# Patient Record
Sex: Male | Born: 1955
Health system: Southern US, Community
[De-identification: ages and names within clinical notes are randomized; demographics above are authoritative.]

## PROBLEM LIST (undated history)

## (undated) DIAGNOSIS — J449 Chronic obstructive pulmonary disease, unspecified: Secondary | ICD-10-CM

## (undated) DIAGNOSIS — Z720 Tobacco use: Secondary | ICD-10-CM

## (undated) DIAGNOSIS — I2510 Atherosclerotic heart disease of native coronary artery without angina pectoris: Secondary | ICD-10-CM

## (undated) DIAGNOSIS — I428 Other cardiomyopathies: Secondary | ICD-10-CM

## (undated) DIAGNOSIS — Z5189 Encounter for other specified aftercare: Secondary | ICD-10-CM

## (undated) DIAGNOSIS — I10 Essential (primary) hypertension: Secondary | ICD-10-CM

## (undated) DIAGNOSIS — Z860100 Personal history of colon polyps, unspecified: Secondary | ICD-10-CM

## (undated) DIAGNOSIS — I739 Peripheral vascular disease, unspecified: Secondary | ICD-10-CM

## (undated) DIAGNOSIS — K219 Gastro-esophageal reflux disease without esophagitis: Secondary | ICD-10-CM

## (undated) DIAGNOSIS — K5730 Diverticulosis of large intestine without perforation or abscess without bleeding: Secondary | ICD-10-CM

## (undated) DIAGNOSIS — M1990 Unspecified osteoarthritis, unspecified site: Secondary | ICD-10-CM

## (undated) DIAGNOSIS — I509 Heart failure, unspecified: Secondary | ICD-10-CM

## (undated) DIAGNOSIS — J439 Emphysema, unspecified: Secondary | ICD-10-CM

## (undated) DIAGNOSIS — Z950 Presence of cardiac pacemaker: Secondary | ICD-10-CM

## (undated) DIAGNOSIS — G4730 Sleep apnea, unspecified: Secondary | ICD-10-CM

## (undated) DIAGNOSIS — R0602 Shortness of breath: Secondary | ICD-10-CM

## (undated) DIAGNOSIS — Z95810 Presence of automatic (implantable) cardiac defibrillator: Secondary | ICD-10-CM

## (undated) HISTORY — DX: Diverticulosis of large intestine without perforation or abscess without bleeding: K57.30

## (undated) HISTORY — PX: OTHER SURGICAL HISTORY: SHX169

## (undated) HISTORY — DX: Personal history of colonic polyps: Z86.010

## (undated) HISTORY — DX: Essential (primary) hypertension: I10

## (undated) HISTORY — DX: Sleep apnea, unspecified: G47.30

## (undated) HISTORY — DX: Chronic obstructive pulmonary disease, unspecified: J44.9

## (undated) HISTORY — DX: Heart failure, unspecified: I50.9

## (undated) HISTORY — DX: Other cardiomyopathies: I42.8

## (undated) HISTORY — DX: Gastro-esophageal reflux disease without esophagitis: K21.9

## (undated) HISTORY — DX: Encounter for other specified aftercare: Z51.89

## (undated) HISTORY — DX: Emphysema, unspecified: J43.9

## (undated) HISTORY — DX: Personal history of colon polyps, unspecified: Z86.0100

## (undated) HISTORY — DX: Atherosclerotic heart disease of native coronary artery without angina pectoris: I25.10

## (undated) HISTORY — DX: Peripheral vascular disease, unspecified: I73.9

## (undated) HISTORY — DX: Tobacco use: Z72.0

---

## 2002-07-26 ENCOUNTER — Encounter: Payer: Self-pay | Admitting: Family Medicine

## 2002-07-26 ENCOUNTER — Encounter: Admission: RE | Admit: 2002-07-26 | Discharge: 2002-07-26 | Payer: Self-pay | Admitting: Family Medicine

## 2004-06-22 HISTORY — PX: CARDIAC CATHETERIZATION: SHX172

## 2004-07-13 ENCOUNTER — Inpatient Hospital Stay (HOSPITAL_COMMUNITY): Admission: EM | Admit: 2004-07-13 | Discharge: 2004-07-14 | Payer: Self-pay | Admitting: Emergency Medicine

## 2004-08-02 ENCOUNTER — Encounter: Payer: Self-pay | Admitting: Pulmonary Disease

## 2004-08-02 ENCOUNTER — Ambulatory Visit (HOSPITAL_BASED_OUTPATIENT_CLINIC_OR_DEPARTMENT_OTHER): Admission: RE | Admit: 2004-08-02 | Discharge: 2004-08-02 | Payer: Self-pay | Admitting: Cardiology

## 2004-10-09 ENCOUNTER — Ambulatory Visit: Payer: Self-pay | Admitting: Cardiology

## 2004-10-23 ENCOUNTER — Ambulatory Visit: Payer: Self-pay | Admitting: Cardiology

## 2004-12-06 ENCOUNTER — Ambulatory Visit: Payer: Self-pay | Admitting: *Deleted

## 2005-01-14 ENCOUNTER — Ambulatory Visit: Payer: Self-pay | Admitting: Cardiology

## 2005-03-01 ENCOUNTER — Ambulatory Visit: Payer: Self-pay | Admitting: Cardiology

## 2005-03-11 ENCOUNTER — Ambulatory Visit: Payer: Self-pay | Admitting: Internal Medicine

## 2005-03-11 ENCOUNTER — Ambulatory Visit: Payer: Self-pay | Admitting: Cardiology

## 2005-03-13 ENCOUNTER — Ambulatory Visit: Admission: RE | Admit: 2005-03-13 | Discharge: 2005-03-13 | Payer: Self-pay | Admitting: Internal Medicine

## 2005-03-18 ENCOUNTER — Ambulatory Visit: Payer: Self-pay | Admitting: Internal Medicine

## 2005-03-18 ENCOUNTER — Ambulatory Visit (HOSPITAL_COMMUNITY): Admission: RE | Admit: 2005-03-18 | Discharge: 2005-03-18 | Payer: Self-pay | Admitting: Internal Medicine

## 2005-04-09 ENCOUNTER — Ambulatory Visit: Payer: Self-pay | Admitting: Internal Medicine

## 2005-04-15 ENCOUNTER — Ambulatory Visit: Payer: Self-pay | Admitting: Cardiology

## 2005-04-22 HISTORY — PX: COLONOSCOPY: SHX174

## 2005-04-23 ENCOUNTER — Ambulatory Visit (HOSPITAL_COMMUNITY): Admission: RE | Admit: 2005-04-23 | Discharge: 2005-04-23 | Payer: Self-pay | Admitting: Cardiology

## 2005-04-23 ENCOUNTER — Ambulatory Visit: Payer: Self-pay | Admitting: Cardiology

## 2005-04-29 ENCOUNTER — Ambulatory Visit: Payer: Self-pay | Admitting: Internal Medicine

## 2005-05-02 ENCOUNTER — Ambulatory Visit: Payer: Self-pay | Admitting: Internal Medicine

## 2005-05-02 ENCOUNTER — Encounter (INDEPENDENT_AMBULATORY_CARE_PROVIDER_SITE_OTHER): Payer: Self-pay | Admitting: Specialist

## 2005-05-15 ENCOUNTER — Ambulatory Visit: Payer: Self-pay

## 2005-05-22 ENCOUNTER — Ambulatory Visit: Payer: Self-pay | Admitting: Cardiology

## 2005-05-28 ENCOUNTER — Ambulatory Visit: Payer: Self-pay | Admitting: Cardiology

## 2005-06-04 ENCOUNTER — Ambulatory Visit: Payer: Self-pay | Admitting: Internal Medicine

## 2005-07-08 ENCOUNTER — Ambulatory Visit: Payer: Self-pay | Admitting: Internal Medicine

## 2005-07-18 ENCOUNTER — Ambulatory Visit: Payer: Self-pay | Admitting: Internal Medicine

## 2005-07-25 ENCOUNTER — Ambulatory Visit: Payer: Self-pay | Admitting: Cardiology

## 2005-08-19 ENCOUNTER — Ambulatory Visit: Payer: Self-pay | Admitting: Cardiology

## 2005-09-25 ENCOUNTER — Ambulatory Visit: Payer: Self-pay | Admitting: Cardiology

## 2005-09-25 ENCOUNTER — Inpatient Hospital Stay (HOSPITAL_COMMUNITY): Admission: EM | Admit: 2005-09-25 | Discharge: 2005-09-26 | Payer: Self-pay | Admitting: Emergency Medicine

## 2005-10-01 ENCOUNTER — Ambulatory Visit: Payer: Self-pay | Admitting: Internal Medicine

## 2005-11-04 ENCOUNTER — Ambulatory Visit: Payer: Self-pay | Admitting: Cardiology

## 2005-11-08 ENCOUNTER — Ambulatory Visit: Payer: Self-pay | Admitting: Internal Medicine

## 2005-11-21 ENCOUNTER — Ambulatory Visit: Payer: Self-pay | Admitting: Internal Medicine

## 2005-11-22 HISTORY — PX: PACEMAKER INSERTION: SHX728

## 2005-11-27 ENCOUNTER — Ambulatory Visit (HOSPITAL_COMMUNITY): Admission: RE | Admit: 2005-11-27 | Discharge: 2005-11-28 | Payer: Self-pay | Admitting: Internal Medicine

## 2005-11-27 ENCOUNTER — Ambulatory Visit: Payer: Self-pay | Admitting: Internal Medicine

## 2005-12-11 ENCOUNTER — Ambulatory Visit: Payer: Self-pay

## 2005-12-30 ENCOUNTER — Ambulatory Visit: Payer: Self-pay | Admitting: Cardiology

## 2006-03-11 ENCOUNTER — Ambulatory Visit: Payer: Self-pay | Admitting: Internal Medicine

## 2006-03-14 ENCOUNTER — Ambulatory Visit: Payer: Self-pay | Admitting: Cardiovascular Disease

## 2006-04-24 ENCOUNTER — Ambulatory Visit: Payer: Self-pay

## 2006-04-24 ENCOUNTER — Encounter: Payer: Self-pay | Admitting: Cardiology

## 2006-06-17 ENCOUNTER — Ambulatory Visit: Payer: Self-pay | Admitting: Internal Medicine

## 2006-06-30 ENCOUNTER — Ambulatory Visit: Payer: Self-pay | Admitting: Cardiology

## 2006-09-18 ENCOUNTER — Ambulatory Visit: Payer: Self-pay

## 2006-10-22 ENCOUNTER — Ambulatory Visit: Payer: Self-pay | Admitting: Internal Medicine

## 2006-10-22 ENCOUNTER — Inpatient Hospital Stay (HOSPITAL_COMMUNITY): Admission: EM | Admit: 2006-10-22 | Discharge: 2006-10-23 | Payer: Self-pay | Admitting: Emergency Medicine

## 2006-10-28 ENCOUNTER — Ambulatory Visit: Payer: Self-pay

## 2006-10-29 ENCOUNTER — Encounter: Payer: Self-pay | Admitting: Internal Medicine

## 2006-10-30 ENCOUNTER — Ambulatory Visit: Payer: Self-pay | Admitting: Internal Medicine

## 2006-11-07 ENCOUNTER — Ambulatory Visit: Payer: Self-pay

## 2006-11-11 ENCOUNTER — Ambulatory Visit: Payer: Self-pay | Admitting: Family Medicine

## 2006-11-12 ENCOUNTER — Ambulatory Visit: Payer: Self-pay | Admitting: Cardiology

## 2006-11-28 ENCOUNTER — Ambulatory Visit: Payer: Self-pay | Admitting: Cardiology

## 2006-12-09 ENCOUNTER — Ambulatory Visit: Payer: Self-pay | Admitting: Internal Medicine

## 2007-01-02 ENCOUNTER — Ambulatory Visit: Payer: Self-pay | Admitting: Internal Medicine

## 2007-02-09 ENCOUNTER — Encounter: Admission: RE | Admit: 2007-02-09 | Discharge: 2007-05-10 | Payer: Self-pay | Admitting: Internal Medicine

## 2007-02-09 ENCOUNTER — Encounter (INDEPENDENT_AMBULATORY_CARE_PROVIDER_SITE_OTHER): Payer: Self-pay | Admitting: Internal Medicine

## 2007-03-24 ENCOUNTER — Ambulatory Visit: Payer: Self-pay | Admitting: Cardiology

## 2007-03-24 LAB — CONVERTED CEMR LAB
BUN: 14 mg/dL (ref 6–23)
Basophils Absolute: 0.1 10*3/uL (ref 0.0–0.1)
Basophils Relative: 0.7 % (ref 0.0–1.0)
CO2: 29 meq/L (ref 19–32)
Calcium: 9.5 mg/dL (ref 8.4–10.5)
Chloride: 106 meq/L (ref 96–112)
Creatinine, Ser: 1 mg/dL (ref 0.4–1.5)
Eosinophils Absolute: 0.2 10*3/uL (ref 0.0–0.6)
Eosinophils Relative: 1.6 % (ref 0.0–5.0)
GFR calc Af Amer: 102 mL/min
GFR calc non Af Amer: 84 mL/min
Glucose, Bld: 76 mg/dL (ref 70–99)
HCT: 48.3 % (ref 39.0–52.0)
Hemoglobin: 16.7 g/dL (ref 13.0–17.0)
Lymphocytes Relative: 45.8 % (ref 12.0–46.0)
MCHC: 34.7 g/dL (ref 30.0–36.0)
MCV: 80.5 fL (ref 78.0–100.0)
Monocytes Absolute: 0.5 10*3/uL (ref 0.2–0.7)
Monocytes Relative: 4 % (ref 3.0–11.0)
Neutro Abs: 6.5 10*3/uL (ref 1.4–7.7)
Neutrophils Relative %: 47.9 % (ref 43.0–77.0)
Platelets: 301 10*3/uL (ref 150–400)
Potassium: 5.2 meq/L — ABNORMAL HIGH (ref 3.5–5.1)
RBC: 6 M/uL — ABNORMAL HIGH (ref 4.22–5.81)
RDW: 12.8 % (ref 11.5–14.6)
Sodium: 140 meq/L (ref 135–145)
TSH: 2.23 microintl units/mL (ref 0.35–5.50)
WBC: 13.4 10*3/uL — ABNORMAL HIGH (ref 4.5–10.5)

## 2007-03-26 ENCOUNTER — Ambulatory Visit: Payer: Self-pay | Admitting: Internal Medicine

## 2007-04-03 ENCOUNTER — Ambulatory Visit: Payer: Self-pay | Admitting: Internal Medicine

## 2007-04-16 ENCOUNTER — Ambulatory Visit: Payer: Self-pay | Admitting: Cardiology

## 2007-04-19 ENCOUNTER — Emergency Department (HOSPITAL_COMMUNITY): Admission: EM | Admit: 2007-04-19 | Discharge: 2007-04-19 | Payer: Self-pay | Admitting: Family Medicine

## 2007-04-23 ENCOUNTER — Ambulatory Visit: Payer: Self-pay | Admitting: Internal Medicine

## 2007-04-23 DIAGNOSIS — K210 Gastro-esophageal reflux disease with esophagitis: Secondary | ICD-10-CM

## 2007-05-12 ENCOUNTER — Ambulatory Visit: Payer: Self-pay | Admitting: Cardiology

## 2007-05-12 LAB — CONVERTED CEMR LAB
BUN: 13 mg/dL (ref 6–23)
CO2: 28 meq/L (ref 19–32)
Calcium: 9.4 mg/dL (ref 8.4–10.5)
Chloride: 105 meq/L (ref 96–112)
Creatinine, Ser: 0.9 mg/dL (ref 0.4–1.5)
Digitoxin Lvl: 0.1 ng/mL — ABNORMAL LOW (ref 0.8–2.0)
GFR calc Af Amer: 115 mL/min
GFR calc non Af Amer: 95 mL/min
Glucose, Bld: 86 mg/dL (ref 70–99)
Potassium: 4.1 meq/L (ref 3.5–5.1)
Pro B Natriuretic peptide (BNP): 160 pg/mL — ABNORMAL HIGH (ref 0.0–100.0)
Sodium: 137 meq/L (ref 135–145)

## 2007-05-19 ENCOUNTER — Encounter: Admission: RE | Admit: 2007-05-19 | Discharge: 2007-05-19 | Payer: Self-pay | Admitting: Internal Medicine

## 2007-05-25 ENCOUNTER — Ambulatory Visit: Payer: Self-pay | Admitting: Internal Medicine

## 2007-05-25 DIAGNOSIS — K219 Gastro-esophageal reflux disease without esophagitis: Secondary | ICD-10-CM | POA: Insufficient documentation

## 2007-05-25 DIAGNOSIS — E1151 Type 2 diabetes mellitus with diabetic peripheral angiopathy without gangrene: Secondary | ICD-10-CM

## 2007-05-26 LAB — CONVERTED CEMR LAB
Albumin: 3.8 g/dL (ref 3.5–5.2)
BUN: 14 mg/dL (ref 6–23)
CO2: 30 meq/L (ref 19–32)
Calcium: 9.3 mg/dL (ref 8.4–10.5)
Chloride: 106 meq/L (ref 96–112)
Creatinine, Ser: 1.1 mg/dL (ref 0.4–1.5)
GFR calc Af Amer: 91 mL/min
GFR calc non Af Amer: 75 mL/min
Glucose, Bld: 119 mg/dL — ABNORMAL HIGH (ref 70–99)
Hgb A1c MFr Bld: 5.9 % (ref 4.6–6.0)
Phosphorus: 3.1 mg/dL (ref 2.3–4.6)
Potassium: 4.8 meq/L (ref 3.5–5.1)
Sodium: 142 meq/L (ref 135–145)

## 2007-05-27 ENCOUNTER — Ambulatory Visit: Payer: Self-pay | Admitting: Pulmonary Disease

## 2007-06-01 ENCOUNTER — Ambulatory Visit: Payer: Self-pay | Admitting: Internal Medicine

## 2007-06-01 ENCOUNTER — Ambulatory Visit (HOSPITAL_COMMUNITY): Admission: RE | Admit: 2007-06-01 | Discharge: 2007-06-01 | Payer: Self-pay | Admitting: Cardiology

## 2007-06-23 ENCOUNTER — Ambulatory Visit: Payer: Self-pay | Admitting: Internal Medicine

## 2007-07-07 ENCOUNTER — Encounter: Payer: Self-pay | Admitting: Pulmonary Disease

## 2007-08-06 ENCOUNTER — Ambulatory Visit: Payer: Self-pay | Admitting: *Deleted

## 2007-09-15 DIAGNOSIS — Z860100 Personal history of colon polyps, unspecified: Secondary | ICD-10-CM | POA: Insufficient documentation

## 2007-09-15 DIAGNOSIS — K5730 Diverticulosis of large intestine without perforation or abscess without bleeding: Secondary | ICD-10-CM | POA: Insufficient documentation

## 2007-09-15 DIAGNOSIS — K644 Residual hemorrhoidal skin tags: Secondary | ICD-10-CM | POA: Insufficient documentation

## 2007-09-15 DIAGNOSIS — G4730 Sleep apnea, unspecified: Secondary | ICD-10-CM | POA: Insufficient documentation

## 2007-09-15 DIAGNOSIS — E1151 Type 2 diabetes mellitus with diabetic peripheral angiopathy without gangrene: Secondary | ICD-10-CM | POA: Insufficient documentation

## 2007-10-13 ENCOUNTER — Ambulatory Visit: Payer: Self-pay | Admitting: Internal Medicine

## 2007-10-13 DIAGNOSIS — E785 Hyperlipidemia, unspecified: Secondary | ICD-10-CM | POA: Insufficient documentation

## 2007-10-21 ENCOUNTER — Ambulatory Visit: Payer: Self-pay | Admitting: Cardiology

## 2007-11-02 ENCOUNTER — Telehealth: Payer: Self-pay | Admitting: Internal Medicine

## 2007-11-09 ENCOUNTER — Ambulatory Visit: Payer: Self-pay | Admitting: Cardiology

## 2007-11-24 ENCOUNTER — Ambulatory Visit: Payer: Self-pay | Admitting: Internal Medicine

## 2007-12-02 ENCOUNTER — Ambulatory Visit: Payer: Self-pay | Admitting: Pulmonary Disease

## 2008-01-19 ENCOUNTER — Ambulatory Visit: Payer: Self-pay | Admitting: Internal Medicine

## 2008-01-29 ENCOUNTER — Encounter: Payer: Self-pay | Admitting: Internal Medicine

## 2008-01-31 ENCOUNTER — Inpatient Hospital Stay (HOSPITAL_COMMUNITY): Admission: EM | Admit: 2008-01-31 | Discharge: 2008-02-02 | Payer: Self-pay | Admitting: Emergency Medicine

## 2008-02-02 ENCOUNTER — Ambulatory Visit: Payer: Self-pay | Admitting: Internal Medicine

## 2008-02-02 ENCOUNTER — Encounter: Payer: Self-pay | Admitting: Internal Medicine

## 2008-02-09 ENCOUNTER — Ambulatory Visit: Payer: Self-pay | Admitting: Internal Medicine

## 2008-02-09 DIAGNOSIS — J439 Emphysema, unspecified: Secondary | ICD-10-CM | POA: Insufficient documentation

## 2008-02-09 LAB — CONVERTED CEMR LAB
Albumin: 3.8 g/dL (ref 3.5–5.2)
BUN: 6 mg/dL (ref 6–23)
Basophils Absolute: 0.1 10*3/uL (ref 0.0–0.1)
Basophils Relative: 0.5 % (ref 0.0–1.0)
CO2: 29 meq/L (ref 19–32)
Calcium: 9.2 mg/dL (ref 8.4–10.5)
Chloride: 106 meq/L (ref 96–112)
Cholesterol: 258 mg/dL (ref 0–200)
Creatinine, Ser: 0.9 mg/dL (ref 0.4–1.5)
Creatinine,U: 49 mg/dL
Direct LDL: 93.8 mg/dL
Eosinophils Absolute: 0.2 10*3/uL (ref 0.0–0.6)
Eosinophils Relative: 1.7 % (ref 0.0–5.0)
GFR calc Af Amer: 115 mL/min
GFR calc non Af Amer: 95 mL/min
Glucose, Bld: 84 mg/dL (ref 70–99)
HCT: 44.6 % (ref 39.0–52.0)
HDL: 32.4 mg/dL — ABNORMAL LOW (ref >39.0)
Hemoglobin: 15.9 g/dL (ref 13.0–17.0)
Hgb A1c MFr Bld: 5.7 % (ref 4.6–6.0)
Lymphocytes Relative: 35.5 % (ref 12.0–46.0)
MCHC: 35.6 g/dL (ref 30.0–36.0)
MCV: 83.4 fL (ref 78.0–100.0)
Microalb Creat Ratio: 10.2 mg/g (ref 0.0–30.0)
Microalb, Ur: 0.5 mg/dL (ref 0.0–1.9)
Monocytes Absolute: 0.8 10*3/uL — ABNORMAL HIGH (ref 0.2–0.7)
Monocytes Relative: 6.4 % (ref 3.0–11.0)
Neutro Abs: 6.9 10*3/uL (ref 1.4–7.7)
Neutrophils Relative %: 55.9 % (ref 43.0–77.0)
PSA: 1.2 ng/mL (ref 0.10–4.00)
Phosphorus: 2.9 mg/dL (ref 2.3–4.6)
Platelets: 267 10*3/uL (ref 150–400)
Potassium: 4.4 meq/L (ref 3.5–5.1)
RBC: 5.35 M/uL (ref 4.22–5.81)
RDW: 12.1 % (ref 11.5–14.6)
Sodium: 140 meq/L (ref 135–145)
TSH: 1.4 microintl units/mL (ref 0.35–5.50)
Total CHOL/HDL Ratio: 8
Triglycerides: 410 mg/dL (ref 0–149)
VLDL: 82 mg/dL — ABNORMAL HIGH (ref 0–40)
WBC: 12.4 10*3/uL — ABNORMAL HIGH (ref 4.5–10.5)

## 2008-02-25 ENCOUNTER — Ambulatory Visit: Payer: Self-pay | Admitting: Cardiology

## 2008-03-03 ENCOUNTER — Encounter: Payer: Self-pay | Admitting: Internal Medicine

## 2008-04-18 ENCOUNTER — Ambulatory Visit: Payer: Self-pay | Admitting: Internal Medicine

## 2008-05-06 ENCOUNTER — Ambulatory Visit: Payer: Self-pay | Admitting: Family Medicine

## 2008-05-27 ENCOUNTER — Ambulatory Visit: Payer: Self-pay | Admitting: Internal Medicine

## 2008-05-27 DIAGNOSIS — R32 Unspecified urinary incontinence: Secondary | ICD-10-CM | POA: Insufficient documentation

## 2008-05-30 LAB — CONVERTED CEMR LAB
Basophils Absolute: 0 10*3/uL (ref 0.0–0.1)
Basophils Relative: 0.4 % (ref 0.0–1.0)
Creatinine,U: 110.5 mg/dL
Eosinophils Absolute: 0.2 10*3/uL (ref 0.0–0.7)
Eosinophils Relative: 2.4 % (ref 0.0–5.0)
HCT: 46.6 % (ref 39.0–52.0)
Hemoglobin: 16.4 g/dL (ref 13.0–17.0)
Hgb A1c MFr Bld: 5.9 % (ref 4.6–6.0)
Lymphocytes Relative: 39.2 % (ref 12.0–46.0)
MCHC: 35.3 g/dL (ref 30.0–36.0)
MCV: 84.7 fL (ref 78.0–100.0)
Microalb Creat Ratio: 11.8 mg/g (ref 0.0–30.0)
Microalb, Ur: 1.3 mg/dL (ref 0.0–1.9)
Monocytes Absolute: 0.6 10*3/uL (ref 0.1–1.0)
Monocytes Relative: 5.8 % (ref 3.0–12.0)
Neutro Abs: 5.4 10*3/uL (ref 1.4–7.7)
Neutrophils Relative %: 52.2 % (ref 43.0–77.0)
Platelets: 268 10*3/uL (ref 150–400)
RBC: 5.5 M/uL (ref 4.22–5.81)
RDW: 12.5 % (ref 11.5–14.6)
TSH: 0.94 microintl units/mL (ref 0.35–5.50)
WBC: 10.2 10*3/uL (ref 4.5–10.5)

## 2008-05-31 ENCOUNTER — Emergency Department (HOSPITAL_COMMUNITY): Admission: EM | Admit: 2008-05-31 | Discharge: 2008-05-31 | Payer: Self-pay | Admitting: Emergency Medicine

## 2008-05-31 LAB — CONVERTED CEMR LAB
ALT: 28 units/L (ref 0–53)
AST: 58 units/L — ABNORMAL HIGH (ref 0–37)
Albumin: 4.3 g/dL (ref 3.5–5.2)
Alkaline Phosphatase: 69 units/L (ref 39–117)
BUN: 7 mg/dL (ref 6–23)
CO2: 21 meq/L (ref 19–32)
Calcium: 10.1 mg/dL (ref 8.4–10.5)
Chloride: 103 meq/L (ref 96–112)
Cholesterol: 294 mg/dL — ABNORMAL HIGH (ref 0–200)
Creatinine, Ser: 0.91 mg/dL (ref 0.40–1.50)
Digitoxin Lvl: 0.7 ng/mL — ABNORMAL LOW (ref 0.8–2.0)
Glucose, Bld: 73 mg/dL (ref 70–99)
HDL: 26 mg/dL — ABNORMAL LOW (ref >39)
Potassium: 4.8 meq/L (ref 3.5–5.3)
Sodium: 141 meq/L (ref 135–145)
Total Bilirubin: 0.5 mg/dL (ref 0.3–1.2)
Total CHOL/HDL Ratio: 11.3
Total Protein: 7.2 g/dL (ref 6.0–8.3)
Triglycerides: 978 mg/dL — ABNORMAL HIGH (ref <150)

## 2008-06-13 ENCOUNTER — Encounter: Payer: Self-pay | Admitting: Internal Medicine

## 2008-06-16 ENCOUNTER — Telehealth (INDEPENDENT_AMBULATORY_CARE_PROVIDER_SITE_OTHER): Payer: Self-pay | Admitting: *Deleted

## 2008-06-28 ENCOUNTER — Ambulatory Visit: Payer: Self-pay | Admitting: Internal Medicine

## 2008-06-29 LAB — CONVERTED CEMR LAB
Cholesterol: 291 mg/dL (ref 0–200)
Direct LDL: 103.1 mg/dL
HDL: 29.9 mg/dL — ABNORMAL LOW (ref >39.0)
Total CHOL/HDL Ratio: 9.7
Triglycerides: 601 mg/dL (ref 0–149)
VLDL: 120 mg/dL — ABNORMAL HIGH (ref 0–40)

## 2008-07-18 ENCOUNTER — Ambulatory Visit: Payer: Self-pay | Admitting: Internal Medicine

## 2008-08-09 ENCOUNTER — Ambulatory Visit: Payer: Self-pay | Admitting: Cardiology

## 2008-08-17 ENCOUNTER — Encounter: Payer: Self-pay | Admitting: Cardiology

## 2008-08-17 ENCOUNTER — Ambulatory Visit: Payer: Self-pay

## 2008-10-05 ENCOUNTER — Ambulatory Visit: Payer: Self-pay

## 2008-12-07 ENCOUNTER — Ambulatory Visit: Payer: Self-pay | Admitting: Internal Medicine

## 2008-12-07 DIAGNOSIS — E1142 Type 2 diabetes mellitus with diabetic polyneuropathy: Secondary | ICD-10-CM | POA: Insufficient documentation

## 2008-12-30 ENCOUNTER — Telehealth (INDEPENDENT_AMBULATORY_CARE_PROVIDER_SITE_OTHER): Payer: Self-pay | Admitting: *Deleted

## 2009-01-09 ENCOUNTER — Ambulatory Visit: Payer: Self-pay | Admitting: Internal Medicine

## 2009-01-09 LAB — CONVERTED CEMR LAB: Vitamin B-12: 450 pg/mL (ref 211–911)

## 2009-02-16 ENCOUNTER — Ambulatory Visit: Payer: Self-pay | Admitting: Cardiology

## 2009-02-16 ENCOUNTER — Encounter: Payer: Self-pay | Admitting: Cardiology

## 2009-02-16 DIAGNOSIS — Z720 Tobacco use: Secondary | ICD-10-CM

## 2009-02-16 DIAGNOSIS — R0989 Other specified symptoms and signs involving the circulatory and respiratory systems: Secondary | ICD-10-CM | POA: Insufficient documentation

## 2009-02-22 ENCOUNTER — Ambulatory Visit: Payer: Self-pay

## 2009-03-02 ENCOUNTER — Ambulatory Visit: Payer: Self-pay | Admitting: *Deleted

## 2009-03-03 ENCOUNTER — Encounter: Payer: Self-pay | Admitting: Internal Medicine

## 2009-04-07 ENCOUNTER — Ambulatory Visit: Payer: Self-pay | Admitting: Internal Medicine

## 2009-04-07 ENCOUNTER — Encounter: Payer: Self-pay | Admitting: Internal Medicine

## 2009-04-07 DIAGNOSIS — Z95810 Presence of automatic (implantable) cardiac defibrillator: Secondary | ICD-10-CM | POA: Insufficient documentation

## 2009-04-13 ENCOUNTER — Ambulatory Visit: Payer: Self-pay | Admitting: Cardiology

## 2009-04-14 ENCOUNTER — Ambulatory Visit: Payer: Self-pay | Admitting: Internal Medicine

## 2009-04-14 DIAGNOSIS — E538 Deficiency of other specified B group vitamins: Secondary | ICD-10-CM | POA: Insufficient documentation

## 2009-04-17 LAB — CONVERTED CEMR LAB
Albumin: 3.9 g/dL (ref 3.5–5.2)
BUN: 15 mg/dL (ref 6–23)
CO2: 28 meq/L (ref 19–32)
Calcium: 8.9 mg/dL (ref 8.4–10.5)
Chloride: 111 meq/L (ref 96–112)
Creatinine, Ser: 0.9 mg/dL (ref 0.4–1.5)
Glucose, Bld: 91 mg/dL (ref 70–99)
Hgb A1c MFr Bld: 5.8 % (ref 4.6–6.5)
Phosphorus: 2.7 mg/dL (ref 2.3–4.6)
Potassium: 4.3 meq/L (ref 3.5–5.1)
Sodium: 144 meq/L (ref 135–145)
Vitamin B-12: 422 pg/mL (ref 211–911)

## 2009-04-26 LAB — CONVERTED CEMR LAB
ALT: 26 units/L (ref 0–53)
AST: 26 units/L (ref 0–37)
Albumin: 3.8 g/dL (ref 3.5–5.2)
Alkaline Phosphatase: 50 units/L (ref 39–117)
Bilirubin, Direct: 0.2 mg/dL (ref 0.0–0.3)
Cholesterol: 177 mg/dL (ref 0–200)
Direct LDL: 74.5 mg/dL
HDL: 33.7 mg/dL — ABNORMAL LOW (ref >39.00)
Total Bilirubin: 0.9 mg/dL (ref 0.3–1.2)
Total CHOL/HDL Ratio: 5
Total Protein: 6.7 g/dL (ref 6.0–8.3)
Triglycerides: 231 mg/dL — ABNORMAL HIGH (ref 0.0–149.0)
VLDL: 46.2 mg/dL — ABNORMAL HIGH (ref 0.0–40.0)

## 2009-05-11 ENCOUNTER — Ambulatory Visit: Payer: Self-pay | Admitting: Cardiology

## 2009-06-07 ENCOUNTER — Telehealth: Payer: Self-pay | Admitting: Internal Medicine

## 2009-08-09 ENCOUNTER — Encounter (INDEPENDENT_AMBULATORY_CARE_PROVIDER_SITE_OTHER): Payer: Self-pay | Admitting: *Deleted

## 2009-10-16 ENCOUNTER — Ambulatory Visit: Payer: Self-pay | Admitting: Internal Medicine

## 2009-10-18 LAB — CONVERTED CEMR LAB
ALT: 27 units/L (ref 0–53)
AST: 26 units/L (ref 0–37)
Albumin: 4.1 g/dL (ref 3.5–5.2)
Alkaline Phosphatase: 62 units/L (ref 39–117)
BUN: 9 mg/dL (ref 6–23)
Basophils Absolute: 0.1 10*3/uL (ref 0.0–0.1)
Basophils Relative: 0.7 % (ref 0.0–3.0)
Bilirubin, Direct: 0.1 mg/dL (ref 0.0–0.3)
CO2: 27 meq/L (ref 19–32)
Calcium: 9.1 mg/dL (ref 8.4–10.5)
Chloride: 106 meq/L (ref 96–112)
Cholesterol: 190 mg/dL (ref 0–200)
Creatinine, Ser: 0.9 mg/dL (ref 0.4–1.5)
Direct LDL: 79 mg/dL
Eosinophils Absolute: 0.3 10*3/uL (ref 0.0–0.7)
Eosinophils Relative: 2.3 % (ref 0.0–5.0)
GFR calc non Af Amer: 93.85 mL/min (ref >60)
Glucose, Bld: 94 mg/dL (ref 70–99)
HCT: 46.9 % (ref 39.0–52.0)
HDL: 35.7 mg/dL — ABNORMAL LOW (ref >39.00)
Hemoglobin: 16.3 g/dL (ref 13.0–17.0)
Hgb A1c MFr Bld: 5.9 % (ref 4.6–6.5)
Lymphocytes Relative: 41.2 % (ref 12.0–46.0)
Lymphs Abs: 4.5 10*3/uL — ABNORMAL HIGH (ref 0.7–4.0)
MCHC: 34.8 g/dL (ref 30.0–36.0)
MCV: 88.3 fL (ref 78.0–100.0)
Monocytes Absolute: 0.6 10*3/uL (ref 0.1–1.0)
Monocytes Relative: 5.7 % (ref 3.0–12.0)
Neutro Abs: 5.4 10*3/uL (ref 1.4–7.7)
Neutrophils Relative %: 50.1 % (ref 43.0–77.0)
Phosphorus: 2.9 mg/dL (ref 2.3–4.6)
Platelets: 253 10*3/uL (ref 150.0–400.0)
Potassium: 4.4 meq/L (ref 3.5–5.1)
RBC: 5.31 M/uL (ref 4.22–5.81)
RDW: 12.4 % (ref 11.5–14.6)
Sodium: 142 meq/L (ref 135–145)
TSH: 1.19 microintl units/mL (ref 0.35–5.50)
Total Bilirubin: 0.6 mg/dL (ref 0.3–1.2)
Total CHOL/HDL Ratio: 5
Total Protein: 7 g/dL (ref 6.0–8.3)
Triglycerides: 283 mg/dL — ABNORMAL HIGH (ref 0.0–149.0)
VLDL: 56.6 mg/dL — ABNORMAL HIGH (ref 0.0–40.0)
WBC: 10.9 10*3/uL — ABNORMAL HIGH (ref 4.5–10.5)

## 2009-10-27 ENCOUNTER — Telehealth: Payer: Self-pay | Admitting: Internal Medicine

## 2009-11-07 ENCOUNTER — Encounter: Payer: Self-pay | Admitting: Internal Medicine

## 2009-11-07 ENCOUNTER — Ambulatory Visit: Payer: Self-pay | Admitting: Cardiology

## 2010-02-13 ENCOUNTER — Ambulatory Visit: Payer: Self-pay | Admitting: Internal Medicine

## 2010-02-23 ENCOUNTER — Ambulatory Visit: Payer: Self-pay

## 2010-02-23 ENCOUNTER — Encounter: Payer: Self-pay | Admitting: Cardiology

## 2010-03-16 ENCOUNTER — Telehealth: Payer: Self-pay | Admitting: Family Medicine

## 2010-04-03 ENCOUNTER — Ambulatory Visit: Payer: Self-pay | Admitting: Family Medicine

## 2010-04-16 ENCOUNTER — Telehealth: Payer: Self-pay | Admitting: Internal Medicine

## 2010-04-18 ENCOUNTER — Ambulatory Visit: Payer: Self-pay | Admitting: Internal Medicine

## 2010-04-20 LAB — CONVERTED CEMR LAB
ALT: 21 units/L (ref 0–53)
AST: 22 units/L (ref 0–37)
Albumin: 4.3 g/dL (ref 3.5–5.2)
Alkaline Phosphatase: 53 units/L (ref 39–117)
BUN: 13 mg/dL (ref 6–23)
Basophils Absolute: 0.1 10*3/uL (ref 0.0–0.1)
Basophils Relative: 0.8 % (ref 0.0–3.0)
Bilirubin, Direct: 0.1 mg/dL (ref 0.0–0.3)
CO2: 30 meq/L (ref 19–32)
Calcium: 9.7 mg/dL (ref 8.4–10.5)
Chloride: 102 meq/L (ref 96–112)
Cholesterol: 179 mg/dL (ref 0–200)
Creatinine, Ser: 1 mg/dL (ref 0.4–1.5)
Creatinine,U: 118.5 mg/dL
Direct LDL: 63.5 mg/dL
Eosinophils Absolute: 0.2 10*3/uL (ref 0.0–0.7)
Eosinophils Relative: 1.9 % (ref 0.0–5.0)
GFR calc non Af Amer: 82.95 mL/min (ref >60)
Glucose, Bld: 104 mg/dL — ABNORMAL HIGH (ref 70–99)
HCT: 46.1 % (ref 39.0–52.0)
HDL: 34.5 mg/dL — ABNORMAL LOW (ref >39.00)
Hemoglobin: 16.3 g/dL (ref 13.0–17.0)
Hgb A1c MFr Bld: 6 % (ref 4.6–6.5)
Lymphocytes Relative: 41.8 % (ref 12.0–46.0)
Lymphs Abs: 4.5 10*3/uL — ABNORMAL HIGH (ref 0.7–4.0)
MCHC: 35.2 g/dL (ref 30.0–36.0)
MCV: 86.5 fL (ref 78.0–100.0)
Microalb Creat Ratio: 5.1 mg/g (ref 0.0–30.0)
Microalb, Ur: 0.6 mg/dL (ref 0.0–1.9)
Monocytes Absolute: 0.7 10*3/uL (ref 0.1–1.0)
Monocytes Relative: 6.5 % (ref 3.0–12.0)
Neutro Abs: 5.3 10*3/uL (ref 1.4–7.7)
Neutrophils Relative %: 49 % (ref 43.0–77.0)
PSA: 1.2 ng/mL (ref 0.10–4.00)
Phosphorus: 3.8 mg/dL (ref 2.3–4.6)
Platelets: 281 10*3/uL (ref 150.0–400.0)
Potassium: 5.4 meq/L — ABNORMAL HIGH (ref 3.5–5.1)
RBC: 5.33 M/uL (ref 4.22–5.81)
RDW: 13.3 % (ref 11.5–14.6)
Sodium: 140 meq/L (ref 135–145)
TSH: 1.94 microintl units/mL (ref 0.35–5.50)
Total Bilirubin: 0.6 mg/dL (ref 0.3–1.2)
Total CHOL/HDL Ratio: 5
Total Protein: 6.8 g/dL (ref 6.0–8.3)
Triglycerides: 386 mg/dL — ABNORMAL HIGH (ref 0.0–149.0)
VLDL: 77.2 mg/dL — ABNORMAL HIGH (ref 0.0–40.0)
WBC: 10.8 10*3/uL — ABNORMAL HIGH (ref 4.5–10.5)

## 2010-05-11 ENCOUNTER — Ambulatory Visit: Payer: Self-pay | Admitting: Internal Medicine

## 2010-05-11 DIAGNOSIS — E875 Hyperkalemia: Secondary | ICD-10-CM

## 2010-05-11 LAB — CONVERTED CEMR LAB: Potassium: 4.9 meq/L (ref 3.5–5.1)

## 2010-06-11 ENCOUNTER — Emergency Department (HOSPITAL_COMMUNITY): Admission: EM | Admit: 2010-06-11 | Discharge: 2010-06-11 | Payer: Self-pay | Admitting: Family Medicine

## 2010-06-11 ENCOUNTER — Telehealth: Payer: Self-pay | Admitting: Cardiology

## 2010-06-26 ENCOUNTER — Telehealth: Payer: Self-pay | Admitting: Family Medicine

## 2010-08-10 ENCOUNTER — Encounter: Payer: Self-pay | Admitting: Internal Medicine

## 2010-08-17 ENCOUNTER — Telehealth: Payer: Self-pay | Admitting: Family Medicine

## 2010-08-28 ENCOUNTER — Encounter: Payer: Self-pay | Admitting: Cardiology

## 2010-08-29 ENCOUNTER — Ambulatory Visit: Payer: Self-pay

## 2010-08-29 ENCOUNTER — Encounter: Payer: Self-pay | Admitting: Cardiology

## 2010-09-05 ENCOUNTER — Encounter: Payer: Self-pay | Admitting: Cardiology

## 2010-09-07 ENCOUNTER — Ambulatory Visit: Payer: Self-pay | Admitting: Cardiology

## 2010-09-07 ENCOUNTER — Encounter: Payer: Self-pay | Admitting: Internal Medicine

## 2010-09-13 ENCOUNTER — Telehealth: Payer: Self-pay | Admitting: Internal Medicine

## 2010-10-17 ENCOUNTER — Ambulatory Visit: Payer: Self-pay | Admitting: Internal Medicine

## 2010-10-18 LAB — CONVERTED CEMR LAB
Digitoxin Lvl: 1.3 ng/mL (ref 0.8–2.0)
Hgb A1c MFr Bld: 5.8 % (ref 4.6–6.5)

## 2010-10-26 ENCOUNTER — Telehealth: Payer: Self-pay | Admitting: Internal Medicine

## 2010-11-20 ENCOUNTER — Telehealth: Payer: Self-pay | Admitting: Internal Medicine

## 2010-12-28 ENCOUNTER — Telehealth: Payer: Self-pay | Admitting: Internal Medicine

## 2011-01-20 LAB — CONVERTED CEMR LAB
ALT: 20 units/L (ref 0–53)
ALT: 30 units/L (ref 0–40)
AST: 20 units/L (ref 0–37)
Albumin: 4 g/dL (ref 3.5–5.2)
Albumin: 4.2 g/dL (ref 3.5–5.2)
Alkaline Phosphatase: 56 units/L (ref 39–117)
BUN: 14 mg/dL (ref 6–23)
BUN: 14 mg/dL (ref 6–23)
Basophils Absolute: 0.1 10*3/uL (ref 0.0–0.1)
Basophils Absolute: 0.1 10*3/uL (ref 0.0–0.1)
Basophils Relative: 0.8 % (ref 0.0–3.0)
Basophils Relative: 1.2 % — ABNORMAL HIGH (ref 0.0–1.0)
Bilirubin, Direct: 0.1 mg/dL (ref 0.0–0.3)
CO2: 28 meq/L (ref 19–32)
CO2: 30 meq/L (ref 19–32)
Calcium: 9.6 mg/dL (ref 8.4–10.5)
Calcium: 9.8 mg/dL (ref 8.4–10.5)
Chloride: 103 meq/L (ref 96–112)
Chloride: 108 meq/L (ref 96–112)
Chol/HDL Ratio, serum: 9.4
Cholesterol: 206 mg/dL (ref 0–200)
Cholesterol: 250 mg/dL (ref 0–200)
Creatinine, Ser: 1.1 mg/dL (ref 0.4–1.5)
Creatinine, Ser: 1.1 mg/dL (ref 0.4–1.5)
Direct LDL: 76.9 mg/dL
Eosinophil percent: 2 % (ref 0.0–5.0)
Eosinophils Absolute: 0.2 10*3/uL (ref 0.0–0.7)
Eosinophils Relative: 1.9 % (ref 0.0–5.0)
GFR calc Af Amer: 90 mL/min
GFR calc non Af Amer: 75 mL/min
GFR calc non Af Amer: 75 mL/min
Glomerular Filtration Rate, Af Am: 91 mL/min/{1.73_m2}
Glucose, Bld: 77 mg/dL (ref 70–99)
Glucose, Bld: 97 mg/dL (ref 70–99)
HCT: 46.3 % (ref 39.0–52.0)
HCT: 47.1 % (ref 39.0–52.0)
HDL: 26.5 mg/dL — ABNORMAL LOW (ref >39.0)
HDL: 30.5 mg/dL — ABNORMAL LOW (ref >39.0)
Hemoglobin: 15.9 g/dL (ref 13.0–17.0)
Hemoglobin: 16.5 g/dL (ref 13.0–17.0)
Hgb A1c MFr Bld: 5.9 % (ref 4.6–6.0)
LDL DIRECT: 80.7 mg/dL
Lymphocytes Relative: 39.8 % (ref 12.0–46.0)
Lymphocytes Relative: 40.2 % (ref 12.0–46.0)
MCHC: 33.8 g/dL (ref 30.0–36.0)
MCHC: 35.6 g/dL (ref 30.0–36.0)
MCV: 83 fL (ref 78.0–100.0)
MCV: 85.5 fL (ref 78.0–100.0)
Monocytes Absolute: 0.7 10*3/uL (ref 0.2–0.7)
Monocytes Absolute: 0.8 10*3/uL (ref 0.1–1.0)
Monocytes Relative: 6.5 % (ref 3.0–11.0)
Monocytes Relative: 6.9 % (ref 3.0–12.0)
Neutro Abs: 5.4 10*3/uL (ref 1.4–7.7)
Neutro Abs: 6.3 10*3/uL (ref 1.4–7.7)
Neutrophils Relative %: 50.2 % (ref 43.0–77.0)
Neutrophils Relative %: 50.5 % (ref 43.0–77.0)
Phosphorus Concentration: 3.2 mg/dL (ref 2.3–4.6)
Phosphorus: 3.7 mg/dL (ref 2.3–4.6)
Platelets: 269 10*3/uL (ref 150–400)
Platelets: 318 10*3/uL (ref 150–400)
Potassium: 4.9 meq/L (ref 3.5–5.1)
Potassium: 5 meq/L (ref 3.5–5.1)
RBC: 5.41 M/uL (ref 4.22–5.81)
RBC: 5.67 M/uL (ref 4.22–5.81)
RDW: 12.4 % (ref 11.5–14.6)
RDW: 12.7 % (ref 11.5–14.6)
Sodium: 138 meq/L (ref 135–145)
Sodium: 141 meq/L (ref 135–145)
TSH: 1.4 microintl units/mL (ref 0.35–5.50)
Total Bilirubin: 0.7 mg/dL (ref 0.3–1.2)
Total CHOL/HDL Ratio: 6.8
Total Protein: 7.1 g/dL (ref 6.0–8.3)
Triglyceride fasting, serum: 575 mg/dL (ref 0–149)
Triglycerides: 444 mg/dL (ref 0–149)
VLDL: 115 mg/dL — ABNORMAL HIGH (ref 0–40)
VLDL: 89 mg/dL — ABNORMAL HIGH (ref 0–40)
Vitamin B-12: 195 pg/mL — ABNORMAL LOW (ref 211–911)
WBC: 10.7 10*3/uL — ABNORMAL HIGH (ref 4.5–10.5)
WBC: 12.3 10*3/uL — ABNORMAL HIGH (ref 4.5–10.5)

## 2011-01-22 NOTE — Letter (Signed)
Summary: [facility]  [facility]   Imported By: Lanelle Bal 08/20/2010 08:14:47  _____________________________________________________________________  External Attachment:    Type:   Image     Comment:   External Document  Appended Document: [facility]     Clinical Lists Changes  Observations: Added new observation of DIAB EYE EX: No diabetic retinopathy.    (08/10/2010 7:46)       Diabetic Eye Exam  Procedure date:  08/10/2010  Findings:      No diabetic retinopathy.

## 2011-01-22 NOTE — Progress Notes (Signed)
Summary: refill request for clonazepam  Phone Note Refill Request Message from:  Fax from Pharmacy  Refills Requested: Medication #1:  CLONAZEPAM 0.5 MG  TABS Take 1-2 by mouth daily as needed   Last Refilled: 06/26/2010 faxed request from [facility], [phone].  Initial call taken by: Lowella Petties CMA,  August 17, 2010 8:14 AM  Follow-up for Phone Call        Rx called to pharmacy Follow-up by: Mervin Hack CMA [facility]),  August 17, 2010 11:18 AM    Prescriptions: CLONAZEPAM 0.5 MG  TABS (CLONAZEPAM) Take 1-2 by mouth daily as needed  #60 x 0   Entered and Authorized by:   Ruthe Mannan MD   Signed by:   Ruthe Mannan MD on 08/17/2010   Method used:   Telephoned to ...       [facility]* (retail)       [address]       [place], [place]  [address]       Ph: [phone]       Fax: [phone]   RxID:   [id number]

## 2011-01-22 NOTE — Progress Notes (Signed)
  Phone Note Refill Request Message from:  Fax from Pharmacy on June 26, 2010 8:20 AM  Refills Requested: Medication #1:  CLONAZEPAM 0.5 MG  TABS Take 1-2 by mouth daily as needed   Supply Requested: 1 month   Last Refilled: 05/19/2010 [facility] [phone]   Method Requested: Telephone to Pharmacy Initial call taken by: Benny Lennert CMA [facility]),  June 26, 2010 8:21 AM  Follow-up for Phone Call        Rx called to pharmacy Follow-up by: Benny Lennert CMA [facility]),  June 26, 2010 8:53 AM    Prescriptions: CLONAZEPAM 0.5 MG  TABS (CLONAZEPAM) Take 1-2 by mouth daily as needed  #60 x 0   Entered and Authorized by:   Kerby Nora MD   Signed by:   Kerby Nora MD on 06/26/2010   Method used:   Telephoned to ...       [facility]* (retail)       [address]       [place], [place]  [address]       Ph: [phone]       Fax: [phone]   RxID:   [id number]

## 2011-01-22 NOTE — Assessment & Plan Note (Signed)
Summary: CPX / LFW   Vital Signs:  Patient profile:   55 year old male Weight:      267 pounds O2 Sat:      96 % on Room air Temp:     98.2 degrees F oral Pulse rate:   41 / minute Pulse rhythm:   irregular BP sitting:   100 / 60  (left arm) Cuff size:   large  Vitals Entered By: Mervin Hack CMA [facility] Dull) (April 18, 2010 9:51 AM)  O2 Flow:  Room air CC: adult physical   History of Present Illness: doing okay  no heart trouble RIght leg is some worse----constant pain and needs meds. Poor sensation, hard to walk after sitting for a while Really can't walk further than 100 feet without pain discussed  regimen on treadmill to increase angiogenesis  Just piddles on cars  Not much to do discussed need to find purpose  Allergies: No Known Drug Allergies  Past History:  Past medical, surgical, family and social histories (including risk factors) reviewed for relevance to current acute and chronic problems.  Past Medical History: Diabetes mellitus, type II GERD with esophagitis Peripheral vascular disease (Bilateral common iliac artery aneurysms.   Right SFA occlusion over a long               segment.   Left SFA disease with occlusion of the left TP trunk.  Artirogram Oct 2006) Colonic polyps, hx of Diverticulosis, colon Sleep apnea---------------------------------------------Dr Clance Non-ischemic cardiomyopathy-----------------------Dr Hochrein/Taylor    Nonobstructive CAD on cath COPD Tobacco Abuse Urinary incontinence/detrussor instability-----------Dr Annabell Howells Status post ICD placement of a Medtronic Maximo [phone] single chamber   Past Surgical History: Reviewed history from 02/09/2008 and no changes required. Colonoscopy 05/06 Cath negative  07/05 Pacer/defibrillator 12/06 COPD exacerbation/chest pain --2/09  Family History: Reviewed history from 09/15/2007 and no changes required. Father: Alive, CVA Mother: Alive, elevated cholesterol, elevated BP Siblings:  2 brothers alive, one with Hep C, 2 sisters CAD in  Mat  uncle - heart failure ETOH + father's side ? Esophagitis-- Dennie Bible aunt Cancer ? site-- 4 Pat  Uncles  Social History: Reviewed history from 05/27/2008 and no changes required. Marital Status: Married Children: 2 Occupation: Disabled due to COPD and PVD  Alcohol use-no Current Smoker 1ppd,   Review of Systems General:  Complains of sleep disorder; Appetite is okay weight is stable wears seat belt Chronic sleep problems. Eyes:  Complains of blurring; denies double vision and vision loss-1 eye. ENT:  Complains of decreased hearing and ringing in ears; stable hearing problems edentulous--no dentures. CV:  Complains of difficulty breathing while lying down, leg cramps with exertion, and shortness of breath with exertion; denies chest pain or discomfort, difficulty breathing at night, fainting, and lightheadness; sleeps elevated. Resp:  Complains of cough and shortness of breath. GI:  Complains of bloody stools and indigestion; denies abdominal pain, change in bowel habits, dark tarry stools, nausea, and vomiting; occ acid reflux---pain when swalloing at times. Not regularly still occ blood in stool---goes back years and prompted colonoscopy. GU:  Complains of erectile dysfunction, incontinence, nocturia, and urinary frequency; denies urinary hesitancy; needs to wear depends. MS:  Complains of joint pain and cramps; denies joint swelling; hands hurt. Derm:  Denies lesion(s) and rash. Neuro:  Complains of headaches, numbness, tingling, and weakness; occ headaches---pressure in temples at times-----quickly resolves. Psych:  Complains of depression; denies anxiety. Endo:  doesn't check sugars. Heme:  Denies abnormal bruising and enlarge lymph nodes. Allergy:  Complains  of seasonal allergies and sneezing; has had rhinorrhea outside--hasn't used meds.  Physical Exam  General:  alert.  NAD Eyes:  pupils equal, pupils round, pupils  reactive to light, and no optic disk abnormalities.   Ears:  TMs blocked by cerumen Mouth:  no erythema and no exudates.   Neck:  supple, no masses, no thyromegaly, no carotid bruits, and no cervical lymphadenopathy.   Lungs:  normal respiratory effort.  Decreased breath sounds but clear Heart:  regular rhythm, no murmur, no gallop, and bradycardia.   Abdomen:  soft, non-tender, and no masses.   Rectal:  no hemorrhoids and no masses.   Prostate:  limited exam No apparent nodule Msk:  no joint tenderness.  Thickening in hands Pulses:  feet warm but no pulses Extremities:  no edema Neurologic:  alert & oriented X3.   No focal weakness Skin:  no suspicious lesions and no ulcerations.   Axillary Nodes:  No palpable lymphadenopathy Psych:  normally interactive, good eye contact, not anxious appearing, and dysphoric affect.    Diabetes Management Exam:    Foot Exam (with socks and/or shoes not present):       Sensory-Pinprick/Light touch:          Left medial foot (L-4): absent          Left dorsal foot (L-5): absent          Left lateral foot (S-1): absent          Right medial foot (L-4): absent          Right dorsal foot (L-5): absent          Right lateral foot (S-1): absent       Inspection:          Left foot: abnormal             Comments: mild plantar callous          Right foot: abnormal             Comments: mild plantar callous       Nails:          Left foot: thickened          Right foot: thickened   Impression & Recommendations:  Problem # 1:  PREVENTIVE HEALTH CARE (ICD-V70.0) Assessment Comment Only repeat colon per [facility] due for PSA counselled about purpose, etc will check labs for his multiple med problems discussed trying to exercise for heart and to improve collateral circulation to legs  Complete Medication List: 1)  Klor-con 10 10 Meq Tbcr (Potassium chloride) .... Take one by mouth once a day 2)  Pravastatin Sodium 40 Mg Tabs (Pravastatin sodium) ....  Take one tablet by mouth daily at bedtime 3)  Bisoprolol Fumarate 10 Mg Tabs (Bisoprolol fumarate) .... Take one by mouth once a day 4)  Oxybutynin Chloride 5 Mg Tabs (Oxybutynin chloride) .Marland Kitchen.. 1 by mouth two times a day 5)  Digoxin 0.125 Mg Tabs (Digoxin) .... Take one by mouth once a day 6)  Aspirin 81 Mg Tbec (Aspirin) .... Take one by mouth once a day 7)  Albuterol 90 Mcg/act Aers (Albuterol) .... Use 2 puffs four times a day as needed 8)  Clonazepam 0.5 Mg Tabs (Clonazepam) .... Take 1-2 by mouth daily as needed 9)  Gabapentin 600 Mg Tabs (Gabapentin) .Marland Kitchen.. 1 tab in the am 1 tab at dinner time and 2 tabs at bedtime 10)  Fenofibrate 160 Mg Tabs (Fenofibrate) .... One by mouth daily  11)  Enalapril Maleate 20 Mg Tabs (Enalapril maleate) .... Take 1 tablet by mouth once daily 12)  Fish Oil Oil (Fish oil) .... Take 1 tab by mouth once daily 13)  C Pap  14)  Vitamin B-12 1000 Mcg Tabs (Cyanocobalamin) .... Take 1 tablet by mouth once daily 15)  Betamethasone Dipropionate Aug 0.05 % Crea (Betamethasone dipropionate aug) .... Apply to affected area two times a day x 2 weeks 16)  Multivitamins Tabs (Multiple vitamin) .... Take one by mouth once a day  Other Orders: TLB-Lipid Panel (80061-LIPID) TLB-Hepatic/Liver Function Pnl (80076-HEPATIC) Venipuncture (57846) TLB-Renal Function Panel (80069-RENAL) TLB-CBC Platelet - w/Differential (85025-CBCD) TLB-TSH (Thyroid Stimulating Hormone) (84443-TSH) TLB-Microalbumin/Creat Ratio, Urine (82043-MALB) TLB-A1C / Hgb A1C (Glycohemoglobin) (83036-A1C) TLB-PSA (Prostate Specific Antigen) (84153-PSA)  Patient Instructions: 1)  Please schedule a follow-up appointment in 6 months .   Current Allergies (reviewed today): No known allergies

## 2011-01-22 NOTE — Progress Notes (Signed)
Summary: Rx Clonazepam  Phone Note Refill Request Call back at [phone] Message from:  [facility] on April 16, 2010 9:35 AM  Refills Requested: Medication #1:  CLONAZEPAM 0.5 MG  TABS Take 1-2 by mouth daily as needed   Last Refilled: 03/16/2010 Received faxed refill request, form in your IN box.   Method Requested: Fax to Local Pharmacy Initial call taken by: Linde Gillis CMA [facility]),  April 16, 2010 9:35 AM  Follow-up for Phone Call        okay #60 x 1 Follow-up by: Cindee Salt MD,  April 16, 2010 1:10 PM  Additional Follow-up for Phone Call Additional follow up Details #1::        Rx faxed to pharmacy Additional Follow-up by: DeShannon Smith CMA [facility] Dull),  April 16, 2010 2:18 PM    Prescriptions: CLONAZEPAM 0.5 MG  TABS (CLONAZEPAM) Take 1-2 by mouth daily as needed  #60 x 1   Entered by:   Mervin Hack CMA (AAMA)   Authorized by:   Cindee Salt MD   Signed by:   Mervin Hack CMA (AAMA) on 04/16/2010   Method used:   Handwritten   RxID:   [id number]

## 2011-01-22 NOTE — Assessment & Plan Note (Signed)
Summary: rov in aug 2011/sl  Medications Added GABAPENTIN 600 MG TABS (GABAPENTIN) 1 qAM  ans 2 at bedtime      Allergies Added: NKDA  Visit Type:  Follow-up Primary Provider:  Cindee Salt MD  CC:  Cardiomyopathy.  History of Present Illness: The patient presents for routine followup. Since I last saw him he has had no new cardiovascular complaints. He says he sleeps all the time both night and day. However, he has sleep apnea and doesn't use his CPAP. He doesn't exercise because his legs hurt. He has occlusive peripheral vascular disease which cannot be treated surgically or percutaneously. Unfortunately he continues to smoke cigarettes. He is not describing any new shortness of breath, PND or orthopnea. He is not describing any new palpitations, presyncope or syncope.  Current Medications (verified): 1)  Pravastatin Sodium 40 Mg Tabs (Pravastatin Sodium) .... Take One Tablet By Mouth Daily At Bedtime 2)  Bisoprolol Fumarate 10 Mg  Tabs (Bisoprolol Fumarate) .... Take One By Mouth Once A Day 3)  Oxybutynin Chloride 5 Mg Tabs (Oxybutynin Chloride) .Marland Kitchen.. 1 By Mouth Two Times A Day 4)  Digoxin 0.125 Mg  Tabs (Digoxin) .... Take One By Mouth Once A Day 5)  Aspirin 81 Mg  Tbec (Aspirin) .... Take One By Mouth Once A Day 6)  Albuterol 90 Mcg/act  Aers (Albuterol) .... Use 2 Puffs Four Times A Day As Needed 7)  Clonazepam 0.5 Mg  Tabs (Clonazepam) .... Take 1-2 By Mouth Daily As Needed 8)  Gabapentin 600 Mg Tabs (Gabapentin) .Marland Kitchen.. 1 Qam  Ans 2 At Bedtime 9)  Fenofibrate 160 Mg Tabs (Fenofibrate) .... One By Mouth Daily 10)  Enalapril Maleate 20 Mg Tabs (Enalapril Maleate) .... Take 1 Tablet By Mouth Once Daily 11)  Fish Oil  Oil (Fish Oil) .... Take 1 Tab By Mouth Once Daily 12)  C Pap 13)  Vitamin B-12 1000 Mcg Tabs (Cyanocobalamin) .... Take 1 Tablet By Mouth Once Daily 14)  Multivitamins   Tabs (Multiple Vitamin) .... Take One By Mouth Once A Day  Allergies (verified): No Known  Drug Allergies  Past History:  Past Medical History: Reviewed history from 04/18/2010 and no changes required. Diabetes mellitus, type II GERD with esophagitis Peripheral vascular disease (Bilateral common iliac artery aneurysms.   Right SFA occlusion over a long               segment.   Left SFA disease with occlusion of the left TP trunk.  Artirogram Oct 2006) Colonic polyps, hx of Diverticulosis, colon Sleep apnea---------------------------------------------Dr Clance Non-ischemic cardiomyopathy-----------------------Dr Hochrein/Taylor    Nonobstructive CAD on cath COPD Tobacco Abuse Urinary incontinence/detrussor instability-----------Dr Annabell Howells Status post ICD placement of a Medtronic Maximo [phone] single chamber   Past Surgical History: Reviewed history from 02/09/2008 and no changes required. Colonoscopy 05/06 Cath negative  07/05 Pacer/defibrillator 12/06 COPD exacerbation/chest pain --2/09  Review of Systems       As stated in the HPI and negative for all other systems.   Vital Signs:  Patient profile:   55 year old male Height:      69 inches Weight:      265 pounds BMI:     39.28 Pulse rate:   63 / minute Resp:     16 per minute BP sitting:   127 / 69  (right arm) Cuff size:   large  Vitals Entered By: Marrion Coy, CNA (September 07, 2010 9:27 AM)  Physical Exam  General:  Well developed,  well nourished, in no acute distress. Head:  normocephalic and atraumatic Mouth:  Edentulous. Oral mucosa normal. Neck:  Neck supple, no JVD. No masses, thyromegaly or abnormal cervical nodes. Chest Wall:  Well-healed ICD scar Lungs:  Clear bilaterally to auscultation and percussion. Abdomen:  Bowel sounds positive; abdomen soft and non-tender without masses, organomegaly, or hernias noted. No hepatosplenomegaly, obese Msk:  Back normal, normal gait. Muscle strength and tone normal. Extremities:  No clubbing or cyanosis. Neurologic:  Alert and oriented x 3. Skin:   Intact without lesions or rashes. Cervical Nodes:  no significant adenopathy Axillary Nodes:  no significant adenopathy Inguinal Nodes:  no significant adenopathy Psych:  Normal affect.   Detailed Cardiovascular Exam  Neck    Carotids: Carotids full and equal bilaterally without bruits.      Neck Veins: Normal, no JVD.    Heart    Inspection: no deformities or lifts noted.      Palpation: normal PMI with no thrills palpable.      Auscultation: regular rate and rhythm, S1, S2 without murmurs, rubs, gallops, or clicks.    Vascular    Abdominal Aorta: no palpable masses, pulsations, or audible bruits.      Femoral Pulses: diminished right femoral pulse and diminshed left femoral pulse.      Pedal Pulses: absent right dorsalis pedis pulse, absent right posterior tibial pulse, absent left dorsalis pedis pulse, and absent left posterior tibial pulse.      Radial Pulses: normal radial pulses bilaterally.      Peripheral Circulation: no clubbing, cyanosis, or edema noted with normal capillary refill.     EKG  Procedure date:  09/07/2010  Findings:      Sinus rhythm, rate 63, premature ventricular contractions, first-degree AV block, left ventricular hypertrophy by voltage criteria   ICD Specifications Following MD:  Lewayne Bunting, MD     Referring MD:  [facility] ICD Vendor:  Medtronic     ICD Model Number:  7232     ICD Serial Number:  [id number] ICD DOI:  11/27/2005     ICD Implanting MD:  Lewayne Bunting, MD  Lead 1:    Location: RV     DOI: 11/27/2005     Model #: [id number]     Serial #: [id number]     Status: active  Indications::  NICM   ICD Follow Up ICD Dependent:  No      Brady Parameters Mode VVI     Lower Rate Limit:  40      Tachy Zones VF:  200     VT:  250 VIA VF     VT1:  176     Impression & Recommendations:  Problem # 1:  CARDIOMYOPATHY (ICD-425.4) His last echo was in 2009 and his EF had improved to 45-50%. No further imaging is indicated. He will continue with the  meds as listed.  Problem # 2:  PERIPHERAL VASCULAR DISEASE (ICD-443.9) Unfortunately this is a severe and limiting condition. He continues to smoke cigarettes. We discussed this at great length (greater than 3 minutes). He quit cold [place] before and should be able to do that again. He needs to try to walk  Problem # 3:  SLEEP APNEA (ICD-780.57) This has to be considered the cause of his fatigue. I told him that he needs to wear his CPAP and if he does and is still tired we could investigate further.  Problem # 4:  CAROTID BRUIT (ICD-785.9) He has 60-79% left stenosis and  40-59% right stenosis. This was checked earlier this month and will be followed up in 6 months.  Problem # 5:  HYPERCHOLESTEROLEMIA (ICD-272.0) I reviewed his lipid profile with an HDL of 34.5 and LDL 63.5. He is on combination therapy and can continue this regimen.  Other Orders: EKG w/ Interpretation (93000)  Patient Instructions: 1)  Your physician recommends that you schedule a follow-up appointment in: 12 months with Dr Antoine Poche 2)  Your physician recommends that you continue on your current medications as directed. Please refer to the Current Medication list given to you today.

## 2011-01-22 NOTE — Progress Notes (Signed)
Summary: refill request for clonazepam  Phone Note Refill Request Message from:  Fax from Pharmacy  Refills Requested: Medication #1:  CLONAZEPAM 0.5 MG  TABS Take 1-2 by mouth daily as needed   Last Refilled: 02/06/2010 Faxed request from [facility], [phone].  Initial call taken by: Lowella Petties CMA,  March 16, 2010 12:30 PM    Prescriptions: CLONAZEPAM 0.5 MG  TABS (CLONAZEPAM) Take 1-2 by mouth daily as needed  #60 x 0   Entered and Authorized by:   Ruthe Mannan MD   Signed by:   Ruthe Mannan MD on 03/16/2010   Method used:   Print then Give to Patient   RxID:   [id number]   Appended Document: refill request for clonazepam Medication phoned to pharmacy.

## 2011-01-22 NOTE — Progress Notes (Signed)
Summary: gabapentin / clonazepam   Phone Note Refill Request Message from:  Fax from Pharmacy on October 26, 2010 9:19 AM  Refills Requested: Medication #1:  GABAPENTIN 600 MG TABS 1 by mouth in the morning and 2 by mouth at bedtime   Last Refilled: 10/12/2010  Medication #2:  CLONAZEPAM 0.5 MG  TABS Take 1-2 by mouth daily as needed   Last Refilled: 09/20/2010 Refill request from [facility]. Form is on your desk. [phone].  Initial call taken by: Melody Comas,  October 26, 2010 9:21 AM  Follow-up for Phone Call        gabapentin #120 x 11  clonazepam  #60 x 0 Follow-up by: Cindee Salt MD,  October 26, 2010 2:12 PM  Additional Follow-up for Phone Call Additional follow up Details #1::        Rx's called to pharmacy. Additional Follow-up by: Linde Gillis CMA [facility] Dull),  October 26, 2010 2:28 PM    Prescriptions: GABAPENTIN 600 MG TABS (GABAPENTIN) 1 by mouth in the morning and 2 by mouth at bedtime  #120 x 11   Entered by:   Linde Gillis CMA (AAMA)   Authorized by:   Mervin Hack CMA (AAMA)   Signed by:   Linde Gillis CMA (AAMA) on 10/26/2010   Method used:   Telephoned to ...       [facility]* (retail)       [address]       [place], [place]  [address]       Ph: [phone]       Fax: [phone]   RxID:   [id number] CLONAZEPAM 0.5 MG  TABS (CLONAZEPAM) Take 1-2 by mouth daily as needed  #60 x 0   Entered by:   Linde Gillis CMA (AAMA)   Authorized by:   Mervin Hack CMA (AAMA)   Signed by:   Linde Gillis CMA (AAMA) on 10/26/2010   Method used:   Telephoned to ...       [facility]* (retail)       [address]       [place], [place]  [address]       Ph: [phone]       Fax: [phone]   RxID:   [id number]   Prior Medications: PRAVASTATIN SODIUM 40 MG TABS (PRAVASTATIN SODIUM) Take one tablet by mouth daily at bedtime BISOPROLOL FUMARATE 10 MG  TABS (BISOPROLOL FUMARATE) Take one by mouth once a day OXYBUTYNIN CHLORIDE 5 MG  TABS (OXYBUTYNIN CHLORIDE) 1 by mouth two times a day DIGOXIN 0.125 MG  TABS (DIGOXIN) Take one by mouth once a day CLONAZEPAM 0.5 MG  TABS (CLONAZEPAM) Take 1-2 by mouth daily as needed GABAPENTIN 600 MG TABS (GABAPENTIN) 1 by mouth in the morning and 2 by mouth at bedtime FENOFIBRATE 160 MG TABS (FENOFIBRATE) one by mouth daily ENALAPRIL MALEATE 20 MG TABS (ENALAPRIL MALEATE) take 1 tablet by mouth once daily ALBUTEROL 90 MCG/ACT  AERS (ALBUTEROL) Use 2 puffs four times a day as needed FISH OIL  OIL (FISH OIL) take 1 tab by mouth once daily VITAMIN B-12 1000 MCG TABS (CYANOCOBALAMIN) take 1 tablet by mouth once daily MULTIVITAMINS   TABS (MULTIPLE VITAMIN) Take one by mouth once a day ASPIRIN 81 MG  TBEC (ASPIRIN) Take one by mouth once a day Current Allergies: No known allergies

## 2011-01-22 NOTE — Assessment & Plan Note (Signed)
Summary: pc2  Medications Added PRAVASTATIN SODIUM 40 MG TABS (PRAVASTATIN SODIUM) Take one tablet by mouth daily at bedtime GABAPENTIN 600 MG TABS (GABAPENTIN) 1 tab in the am 1 tab at dinner time and 2 tabs at bedtime      Allergies Added: NKDA  Visit Type:  Follow-up Primary Provider:  Cindee Salt MD   History of Present Illness: [name] returns today for follow-up.  He is a middle aged man with an ischemic CM, CHF EF 30%, HTN, and DM.  He denies c/p.  His CHF is class 2.  He has mild peripheral edema.  He denies any intercurrent ICD therapies.  He has severe and chronic claudication with occluded arteries.  He still smokes and refuses to stop.  He is not thought to be a surgical candidate.   Current Medications (verified): 1)  Klor-Con 10 10 Meq  Tbcr (Potassium Chloride) .... Take One By Mouth Once A Day 2)  Pravastatin Sodium 20 Mg Tabs (Pravastatin Sodium) .... Take One Tablet By Mouth Daily At Bedtime 3)  Bisoprolol Fumarate 10 Mg  Tabs (Bisoprolol Fumarate) .... Take One By Mouth Once A Day 4)  Oxybutynin Chloride 5 Mg Tabs (Oxybutynin Chloride) .Marland Kitchen.. 1 By Mouth Two Times A Day 5)  Digoxin 0.125 Mg  Tabs (Digoxin) .... Take One By Mouth Once A Day 6)  Aspirin 81 Mg  Tbec (Aspirin) .... Take One By Mouth Once A Day 7)  C Pap 8)  Multivitamins   Tabs (Multiple Vitamin) .... Take One By Mouth Once A Day 9)  Albuterol 90 Mcg/act  Aers (Albuterol) .... Use 2 Puffs Four Times A Day As Needed 10)  Clonazepam 0.5 Mg  Tabs (Clonazepam) .... Take 1-2 By Mouth Daily As Needed 11)  Fish Oil  Oil (Fish Oil) .... Take 1 Tab By Mouth Once Daily 12)  Fenofibrate 160 Mg Tabs (Fenofibrate) .... One By Mouth Daily 13)  Enalapril Maleate 20 Mg Tabs (Enalapril Maleate) .... Take 1 Tablet By Mouth Once Daily 14)  Vitamin B-12 1000 Mcg Tabs (Cyanocobalamin) .... Take 1 Tablet By Mouth Once Daily 15)  Gabapentin 600 Mg Tabs (Gabapentin) .Marland Kitchen.. 1 Tab in The Am 1 Tab At Dinner Time and 2 Tabs  At Bedtime  Allergies (verified): No Known Drug Allergies  Past History:  Past Medical History: Last updated: 10/16/2009 Diabetes mellitus, type II GERD with esophagitis Peripheral vascular disease (Bilateral common iliac artery aneurysms.   Right SFA occlusion over a long segment.   Left SFA disease with occlusion of the left TP trunk.  Artirogram Oct 2006) Colonic polyps, hx of Diverticulosis, colon Sleep apnea---------------------------------------------Dr Clance Non-ischemic cardiomyopathy-----------------------Dr Hochrein/Taylor    Nonobstructive CAD on cath COPD Tobacco Abuse Urinary incontinence/detrussor instability-----------Dr Annabell Howells Status post ICD placement of a Medtronic Maximo [phone] single chamber   Past Surgical History: Last updated: 02/09/2008 Colonoscopy 05/06 Cath negative  07/05 Pacer/defibrillator 12/06 COPD exacerbation/chest pain --2/09  Review of Systems       The patient complains of peripheral edema.  The patient denies chest pain, syncope, and dyspnea on exertion.    Vital Signs:  Patient profile:   55 year old male Height:      69 inches Weight:      269 pounds BMI:     39.87 Pulse rate:   70 / minute BP sitting:   110 / 70  (left arm)  Vitals Entered By: Laurance Flatten CMA (February 13, 2010 11:03 AM)  Physical Exam  General:  Well  developed, well nourished, in no acute distress. Head:  normocephalic and atraumatic Eyes:  PERRLA/EOM intact; conjunctiva and lids normal. Mouth:  Gums and palate normal. Oral mucosa normal. Neck:  Neck supple, no JVD. No masses, thyromegaly or abnormal cervical nodes. Chest Wall:  well-healed sternotomy scar and ICD incision. Lungs:  Clear bilaterally to auscultation with no wheezes, rales, or rhonchi. Heart:  RRR with normal S1 and S2.  PMI is enlarged and laterally displaced. Abdomen:  Bowel sounds positive; abdomen soft and non-tender without masses, organomegaly, or hernias noted. No hepatosplenomegaly.  obese Msk:  Back normal, normal gait. Muscle strength and tone normal. Pulses:  faint on right, not palpable on left Extremities:  No clubbing or cyanosis. Neurologic:  Alert and oriented x 3.    ICD Specifications Following MD:  Lewayne Bunting, MD     Referring MD:  [facility] ICD Vendor:  [employer]     ICD Model Number:  7232     ICD Serial Number:  [id number] ICD DOI:  11/27/2005     ICD Implanting MD:  Lewayne Bunting, MD  Lead 1:    Location: RV     DOI: 11/27/2005     Model #: 1478     Serial #: [id number]     Status: active  Indications::  NICM   ICD Follow Up Remote Check?  No Battery Voltage:  3.09 V     Charge Time:  8.68 seconds     ICD Dependent:  No       ICD Device Measurements Right Ventricle:  Amplitude: 14.2 mV, Impedance: 384 ohms, Threshold: 1.0 V at 0.4 msec Shock Impedance: 49/63 ohms   Episodes Shock:  0     ATP:  0     Nonsustained:  0     Ventricular Pacing:  0.4%  Brady Parameters Mode VVI     Lower Rate Limit:  40      Tachy Zones VF:  200     VT:  250 VIA VF     VT1:  176     Next Cardiology Appt Due:  04/22/2010 Tech Comments:  No parameter changes. Checked by [employer].  ROV 3 months clinic. Altha Harm, LPN  February 13, 2010 11:13 AM  MD Comments:  Agree with above.  Impression & Recommendations:  Problem # 1:  AUTOMATIC IMPLANTABLE CARDIAC DEFIBRILLATOR SITU (ICD-V45.02) His device is working normally.  Will recheck in several months.  Problem # 2:  TOBACCO ABUSE (ICD-305.1) Despite my urging, he refuses to stop smoking.  He does admit to trying to cut down his intake.  Problem # 3:  HYPERCHOLESTEROLEMIA (ICD-272.0) He will continue his current meds except that I have increased his dose of pravachol to 40 mg daily. His updated medication list for this problem includes:    Pravastatin Sodium 40 Mg Tabs (Pravastatin sodium) .Marland Kitchen... Take one tablet by mouth daily at bedtime    Fenofibrate 160 Mg Tabs (Fenofibrate) ..... One by mouth  daily  Patient Instructions: 1)  Your physician recommends that you schedule a follow-up appointment in: 12 months with Dr Ladona Ridgel 2)  Your physician has recommended you make the following change in your medication: increase Pravachol to 40mg  every night Prescriptions: PRAVASTATIN SODIUM 40 MG TABS (PRAVASTATIN SODIUM) Take one tablet by mouth daily at bedtime  #30 x 11   Entered by:   Dennis Bast, RN, BSN   Authorized by:   Laren Boom, MD, [facility]   Signed by:   Dennis Bast,  RN, BSN on 02/13/2010   Method used:   Electronically to        [facility]* (retail)       [address]       [place], [place]  [address]       Ph: [phone]       Fax: [phone]   RxID:   [id number]

## 2011-01-22 NOTE — Assessment & Plan Note (Signed)
Summary: SUN POISON/DLO   Vital Signs:  Patient profile:   55 year old male Height:      69 inches Weight:      267 pounds BMI:     39.57 Temp:     97.5 degrees F oral Pulse rate:   76 / minute Pulse rhythm:   regular BP sitting:   108 / 70  (left arm) Cuff size:   large  Vitals Entered By: Linde Gillis CMA Duncan Dull) (April 03, 2010 8:32 AM) CC: ? sun poisioning   History of Present Illness: Last week in yard in sun alot...now has rash on arms..some blistering, areas on nose in sun distribution. Very itchy.  No known exposure to plant allergen.  No new medicaitons. Mild worsening of SOB. No fever. No toungue, lip, throat swelling.  using caladryl clear.   Problems Prior to Update: 1)  Obesity, Unspecified  (ICD-278.00) 2)  Dizziness  (ICD-780.4) 3)  B12 Deficiency  (ICD-266.2) 4)  Automatic Implantable Cardiac Defibrillator Situ  (ICD-V45.02) 5)  Carotid Bruit  (ICD-785.9) 6)  Tobacco Abuse  (ICD-305.1) 7)  Unspecified Vitamin B Deficiency  (ICD-266.9) 8)  Neuropathy  (ICD-355.9) 9)  Hypercholesterolemia  (ICD-272.0) 10)  Urinary Incontinence  (ICD-788.30) 11)  Chronic Obstructive Pulmonary Disease, Acute Exacerbation  (ICD-491.21) 12)  COPD  (ICD-496) 13)  Dyslipidemia  (ICD-272.4) 14)  Screening For Malignant Neoplasm, Prostate  (ICD-V76.44) 15)  Diverticulosis, Colon  (ICD-562.10) 16)  Hemorrhoids, External  (ICD-455.3) 17)  Colonic Polyps, Hx of  (ICD-V12.72) 18)  Sleep Apnea  (ICD-780.57) 19)  Peripheral Vascular Disease  (ICD-443.9) 20)  Gerd  (ICD-530.81) 21)  Diabetes Mellitus, Type II  (ICD-250.00) 22)  Cardiomyopathy  (ICD-425.4) 23)  Reflux Esophagitis  (ICD-530.11) 24)  Chest Pain  (ICD-786.50)  Current Medications (verified): 1)  Klor-Con 10 10 Meq  Tbcr (Potassium Chloride) .... Take One By Mouth Once A Day 2)  Pravastatin Sodium 40 Mg Tabs (Pravastatin Sodium) .... Take One Tablet By Mouth Daily At Bedtime 3)  Bisoprolol Fumarate 10 Mg  Tabs  (Bisoprolol Fumarate) .... Take One By Mouth Once A Day 4)  Oxybutynin Chloride 5 Mg Tabs (Oxybutynin Chloride) .Marland Kitchen.. 1 By Mouth Two Times A Day 5)  Digoxin 0.125 Mg  Tabs (Digoxin) .... Take One By Mouth Once A Day 6)  Aspirin 81 Mg  Tbec (Aspirin) .... Take One By Mouth Once A Day 7)  C Pap 8)  Multivitamins   Tabs (Multiple Vitamin) .... Take One By Mouth Once A Day 9)  Albuterol 90 Mcg/act  Aers (Albuterol) .... Use 2 Puffs Four Times A Day As Needed 10)  Clonazepam 0.5 Mg  Tabs (Clonazepam) .... Take 1-2 By Mouth Daily As Needed 11)  Fish Oil  Oil (Fish Oil) .... Take 1 Tab By Mouth Once Daily 12)  Fenofibrate 160 Mg Tabs (Fenofibrate) .... One By Mouth Daily 13)  Enalapril Maleate 20 Mg Tabs (Enalapril Maleate) .... Take 1 Tablet By Mouth Once Daily 14)  Vitamin B-12 1000 Mcg Tabs (Cyanocobalamin) .... Take 1 Tablet By Mouth Once Daily 15)  Gabapentin 600 Mg Tabs (Gabapentin) .Marland Kitchen.. 1 Tab in The Am 1 Tab At Dinner Time and 2 Tabs At Bedtime 16)  Betamethasone Dipropionate Aug 0.05 % Crea (Betamethasone Dipropionate Aug) .... Apply To Affected Area Two Times A Day X 2 Weeks  Allergies (verified): No Known Drug Allergies  Past History:  Past medical, surgical, family and social histories (including risk factors) reviewed, and no changes noted (except as  noted below).  Past Medical History: Reviewed history from 10/16/2009 and no changes required. Diabetes mellitus, type II GERD with esophagitis Peripheral vascular disease (Bilateral common iliac artery aneurysms.   Right SFA occlusion over a long segment.   Left SFA disease with occlusion of the left TP trunk.  Artirogram Oct 2006) Colonic polyps, hx of Diverticulosis, colon Sleep apnea---------------------------------------------Dr Clance Non-ischemic cardiomyopathy-----------------------Dr Hochrein/Taylor    Nonobstructive CAD on cath COPD Tobacco Abuse Urinary incontinence/detrussor instability-----------Dr Annabell Howells Status post  ICD placement of a Medtronic Maximo [phone] single chamber   Past Surgical History: Reviewed history from 02/09/2008 and no changes required. Colonoscopy 05/06 Cath negative  07/05 Pacer/defibrillator 12/06 COPD exacerbation/chest pain --2/09  Family History: Reviewed history from 09/15/2007 and no changes required. Father: Alive, CVA Mother: Alive, elevated cholesterol, elevated BP Siblings: 2 brothers alive, one with Hep C, 2 sisters CAD in  Mat  uncle - heart failure ETOH + father's side ? Esophagitis-- Dennie Bible aunt Cancer ? site-- 4 Pat  Uncles  Social History: Reviewed history from 05/27/2008 and no changes required. Marital Status: Married Children: 2 Occupation: Disabled due to COPD and PVD  Alcohol use-no Current Smoker 1ppd,   Review of Systems General:  Denies fatigue and fever. CV:  Denies chest pain or discomfort. Resp:  Denies cough. GI:  Denies abdominal pain.  Physical Exam  General:  overweight male in NAD Mouth:  MMM, no tounge swelling Neck:  no carotid bruit or thyromegaly no cervical or supraclavicular lymphadenopathy  Lungs:  normal respiratory effort, no intercostal retractions, and no accessory muscle use.  Mild rhonchi Heart:  Normal rate and regular rhythm. S1 and S2 normal without gallop, murmur, click, rub or other extra sounds. Skin:  erythematous nodular rash in sin distribution on arms and neck , small lesion on nose.    Impression & Recommendations:  Problem # 1:  ACUTE DERMATITIS DUE TO SOLAR RADIATION (ICD-692.72) Instructed to wear long sleeves, hat and sunscreen 45-55 in sun. Continue all medicaitons.  Treat reash and puritis with topical steroid. Call if not improving.  His updated medication list for this problem includes:    Betamethasone Dipropionate Aug 0.05 % Crea (Betamethasone dipropionate aug) .Marland Kitchen... Apply to affected area two times a day x 2 weeks  Complete Medication List: 1)  Klor-con 10 10 Meq Tbcr (Potassium chloride) ....  Take one by mouth once a day 2)  Pravastatin Sodium 40 Mg Tabs (Pravastatin sodium) .... Take one tablet by mouth daily at bedtime 3)  Bisoprolol Fumarate 10 Mg Tabs (Bisoprolol fumarate) .... Take one by mouth once a day 4)  Oxybutynin Chloride 5 Mg Tabs (Oxybutynin chloride) .Marland Kitchen.. 1 by mouth two times a day 5)  Digoxin 0.125 Mg Tabs (Digoxin) .... Take one by mouth once a day 6)  Aspirin 81 Mg Tbec (Aspirin) .... Take one by mouth once a day 7)  C Pap  8)  Multivitamins Tabs (Multiple vitamin) .... Take one by mouth once a day 9)  Albuterol 90 Mcg/act Aers (Albuterol) .... Use 2 puffs four times a day as needed 10)  Clonazepam 0.5 Mg Tabs (Clonazepam) .... Take 1-2 by mouth daily as needed 11)  Fish Oil Oil (Fish oil) .... Take 1 tab by mouth once daily 12)  Fenofibrate 160 Mg Tabs (Fenofibrate) .... One by mouth daily 13)  Enalapril Maleate 20 Mg Tabs (Enalapril maleate) .... Take 1 tablet by mouth once daily 14)  Vitamin B-12 1000 Mcg Tabs (Cyanocobalamin) .... Take 1 tablet by mouth once  daily 15)  Gabapentin 600 Mg Tabs (Gabapentin) .Marland Kitchen.. 1 tab in the am 1 tab at dinner time and 2 tabs at bedtime 16)  Betamethasone Dipropionate Aug 0.05 % Crea (Betamethasone dipropionate aug) .... Apply to affected area two times a day x 2 weeks Prescriptions: BETAMETHASONE DIPROPIONATE AUG 0.05 % CREA (BETAMETHASONE DIPROPIONATE AUG) Apply to affected area two times a day x 2 weeks  #60 gm x 1   Entered and Authorized by:   Kerby Nora MD   Signed by:   Kerby Nora MD on 04/03/2010   Method used:   Electronically to        [facility]* (retail)       [address]       [place], [place]  [address]       Ph: [phone]       Fax: [phone]   RxID:   [id number]   Current Allergies (reviewed today): No known allergies

## 2011-01-22 NOTE — Miscellaneous (Signed)
  Clinical Lists Changes  Observations: Added new observation of US CAROTID: Stable, mild carotid artery disease Moderate left carotid artery disease 40-59% RICA stenosis 60-79% LICA stenosis No flow detected in the right vetebral artery  Right brachial artery waveform is brisk and biphasic  f/u 6 months (08/29/2010 15:44)      Carotid Doppler  Procedure date:  08/29/2010  Findings:      Stable, mild carotid artery disease Moderate left carotid artery disease 40-59% RICA stenosis 60-79% LICA stenosis No flow detected in the right vetebral artery  Right brachial artery waveform is brisk and biphasic  f/u 6 months

## 2011-01-22 NOTE — Progress Notes (Signed)
Summary: stiff neck left side/some trouble swallowing   Phone Note Call from Patient   Caller: Patient [phone] Reason for Call: Talk to Nurse Summary of Call: woke up this am with stiff neck on the left side-some trouble with swalllowing-called pcp they couldn't see him today-told him to call here-wife would like a nurse to call him to advise what to do since he has some blockage on that side Initial call taken by: Glynda Jaeger,  June 11, 2010 1:04 PM  Follow-up for Phone Call        Spoke with pt. Patient states he woke up this am with stiffness and pain  on left side of neck. Pt. also c/o of problems swallowing. He  states the pain is worse when he swallows. Pt. states he had a neck blackage some time ago. He does not remember what MD did the blockage. Pt. states called his PCP which he daid  the office could not see him today, to call our office. I adviced pt. he needs to call PCP office. He needs to seen  there, because those  symptoms are not  heart related. Pt agreed. Pt. states will call them back. I also advise to go to the ER if the simptoms get worse. Follow-up by: Ollen Gross, RN, BSN,  June 11, 2010 1:42 PM

## 2011-01-22 NOTE — Assessment & Plan Note (Signed)
Summary: 6 MONTH FOLLOW UP /[facility]   Vital Signs:  Patient profile:   55 year old male Weight:      257 pounds Temp:     98.2 degrees F oral Pulse rate:   72 / minute Pulse rhythm:   regular BP sitting:   128 / 62  (left arm) Cuff size:   large  Vitals Entered By: Mervin Hack CMA Duncan Dull) (October 17, 2010 9:12 AM) CC: 6 month follow-up   History of Present Illness: Doing okay Did have to stay inside most of the summer gets photosensitivity and sick if prolonged exposure Now back out to garage to piddle  "I will never be satisfied" because he is used to working 7 days per week Not anhedonic No persistent depressed mood  Does try to walk some Has electric chair but tries to use it as little as possible Still quite limited even in walking due to leg pain  No chest pain breathing okay  Hasn't checked sugars in some time Discussed checking once or twice a month tries to be careful with eating  No stomach troubles in general  Still with some urinary incontinence not clearly better with the oxybutynin  Allergies: No Known Drug Allergies  Past History:  Past medical, surgical, family and social histories (including risk factors) reviewed for relevance to current acute and chronic problems.  Past Medical History: Reviewed history from 04/18/2010 and no changes required. Diabetes mellitus, type II GERD with esophagitis Peripheral vascular disease (Bilateral common iliac artery aneurysms.   Right SFA occlusion over a long               segment.   Left SFA disease with occlusion of the left TP trunk.  Artirogram Oct 2006) Colonic polyps, hx of Diverticulosis, colon Sleep apnea---------------------------------------------Dr Clance Non-ischemic cardiomyopathy-----------------------Dr Hochrein/Taylor    Nonobstructive CAD on cath COPD Tobacco Abuse Urinary incontinence/detrussor instability-----------Dr Annabell Howells Status post ICD placement of a Medtronic Maximo [phone] single  chamber   Past Surgical History: Reviewed history from 02/09/2008 and no changes required. Colonoscopy 05/06 Cath negative  07/05 Pacer/defibrillator 12/06 COPD exacerbation/chest pain --2/09  Family History: Reviewed history from 09/15/2007 and no changes required. Father: Alive, CVA Mother: Alive, elevated cholesterol, elevated BP Siblings: 2 brothers alive, one with Hep C, 2 sisters CAD in  Mat  uncle - heart failure ETOH + father's side ? Esophagitis-- Dennie Bible aunt Cancer ? site-- 4 Pat  Uncles  Social History: Reviewed history from 05/27/2008 and no changes required. Marital Status: Married Children: 2 Occupation: Disabled due to COPD and PVD  Alcohol use-no Current Smoker 1ppd,   Review of Systems       Chronic shoulder pain Sleeps okay---does have freq awakening. 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   Neck:  supple, no masses, no thyromegaly, no carotid bruits, and no cervical lymphadenopathy.   Lungs:  normal respiratory effort, no intercostal retractions, no accessory muscle use, normal breath sounds, and no wheezes.   Heart:  normal rate, regular rhythm, no murmur, and no gallop.   Abdomen:  soft and non-tender.   Pulses:  feet warm but no palpable pulses Extremities:  no edema Skin:  no suspicious lesions and no ulcerations.   Psych:  normally interactive, good eye contact, not anxious appearing, and subdued.    Diabetes Management Exam:    Foot Exam (with  socks and/or shoes not present):       Sensory-Pinprick/Light touch:          Left medial foot (L-4): diminished          Left dorsal foot (L-5): diminished          Left lateral foot (S-1): diminished          Right medial foot (L-4): diminished          Right dorsal foot (L-5): diminished          Right lateral foot (S-1): diminished       Inspection:          Left foot: abnormal              Comments: mild plantar callous          Right foot: abnormal             Comments: mild plantar callous       Nails:          Left foot: thickened          Right foot: thickened   Impression & Recommendations:  Problem # 1:  DIABETES MELLITUS, TYPE II (ICD-250.00) Assessment Unchanged  no Rx  better with diet will recheck labs  His updated medication list for this problem includes:    Enalapril Maleate 20 Mg Tabs (Enalapril maleate) .Marland Kitchen... Take 1 tablet by mouth once daily    Aspirin 81 Mg Tbec (Aspirin) .Marland Kitchen... Take one by mouth once a day  Labs Reviewed: Creat: 1.0 (04/18/2010)     Last Eye Exam: No diabetic retinopathy.    (08/10/2010) Reviewed HgBA1c results: 6.0 (04/18/2010)  5.9 (10/16/2009)  Orders: TLB-A1C / Hgb A1C (Glycohemoglobin) (83036-A1C)  Problem # 2:  PERIPHERAL VASCULAR DISEASE (ICD-443.9) Assessment: Unchanged still very limited tries to walk some  Problem # 3:  URINARY INCONTINENCE (ICD-788.30) Assessment: Unchanged will have him try off the oxybutynin since it doesn't seem to be helping  Problem # 4:  COPD (ICD-496) Assessment: Unchanged stable resp status  His updated medication list for this problem includes:    Albuterol 90 Mcg/act Aers (Albuterol) ..... Use 2 puffs four times a day as needed  Problem # 5:  CARDIOMYOPATHY (ICD-425.4) Assessment: Unchanged  stable status seems more limited by PVD  Orders: T-Digoxin (18841-66063) Specimen Handling (01601) Venipuncture (09323)  Complete Medication List: 1)  Pravastatin Sodium 40 Mg Tabs (Pravastatin sodium) .... Take one tablet by mouth daily at bedtime 2)  Bisoprolol Fumarate 10 Mg Tabs (Bisoprolol fumarate) .... Take one by mouth once a day 3)  Oxybutynin Chloride 5 Mg Tabs (Oxybutynin chloride) .Marland Kitchen.. 1 by mouth two times a day 4)  Digoxin 0.125 Mg Tabs (Digoxin) .... Take one by mouth once a day 5)  Clonazepam 0.5 Mg Tabs (Clonazepam) .... Take 1-2 by mouth daily as needed 6)   Gabapentin 600 Mg Tabs (Gabapentin) .Marland Kitchen.. 1 by mouth in the morning and 2 by mouth at bedtime 7)  Fenofibrate 160 Mg Tabs (Fenofibrate) .... One by mouth daily 8)  Enalapril Maleate 20 Mg Tabs (Enalapril maleate) .... Take 1 tablet by mouth once daily 9)  Albuterol 90 Mcg/act Aers (Albuterol) .... Use 2 puffs four times a day as needed 10)  Fish Oil Oil (Fish oil) .... Take 1 tab by mouth once daily 11)  Vitamin B-12 1000 Mcg Tabs (Cyanocobalamin) .... Take 1 tablet by mouth once daily 12)  Multivitamins Tabs (Multiple vitamin) .... Take one by mouth  once a day 13)  Aspirin 81 Mg Tbec (Aspirin) .... Take one by mouth once a day  Patient Instructions: 1)  Please try without the oxybutynin to see if it is really helping. 2)  Please schedule a follow-up appointment in 6 months for physical   Orders Added: 1)  TLB-A1C / Hgb A1C (Glycohemoglobin) [83036-A1C] 2)  T-Digoxin [31517-61607] 3)  Specimen Handling [99000] 4)  Venipuncture [37106] 5)  Est. Patient Level IV [26948]    Current Allergies (reviewed today): No known allergies      Appended Document: 6 MONTH FOLLOW UP /[facility]     Clinical Lists Changes  Orders: Added new Service order of Flu Vaccine 61yrs + [phone]) - Signed Added new Service order of Admin 1st Vaccine (03500) - Signed Added new Service order of Admin 1st Vaccine [facility]) [phone]) - Signed Observations: Added new observation of FLU VAX#1VIS: 07/17/10 version given October 17, 2010. (10/17/2010 9:49) Added new observation of FLU VAXLOT: [id number] (10/17/2010 9:49) Added new observation of FLU VAX EXP: 06/22/2011 (10/17/2010 9:49) Added new observation of FLU VAXBY: DeShannon Smith CMA (AAMA) (10/17/2010 9:49) Added new observation of FLU VAXRTE: IM (10/17/2010 9:49) Added new observation of FLU VAX DSE: 0.5 ml (10/17/2010 9:49) Added new observation of FLU VAXMFR: GlaxoSmithKline (10/17/2010 9:49) Added new observation of FLU VAX SITE: left deltoid (10/17/2010  9:49) Added new observation of FLU VAX: Fluvax 3+ (10/17/2010 9:49)       Influenza Vaccine    Vaccine Type: Fluvax 3+    Site: left deltoid    Mfr: GlaxoSmithKline    Dose: 0.5 ml    Route: IM    Given by: Mervin Hack CMA (AAMA)    Exp. Date: 06/22/2011    Lot #: [id number]    VIS given: 07/17/10 version given October 17, 2010.  Flu Vaccine Consent Questions    Do you have a history of severe allergic reactions to this vaccine? no    Any prior history of allergic reactions to egg and/or gelatin? no    Do you have a sensitivity to the preservative Thimersol? no    Do you have a past history of Guillan-Barre Syndrome? no    Do you currently have an acute febrile illness? no    Have you ever had a severe reaction to latex? no    Vaccine information given and explained to patient? yes

## 2011-01-22 NOTE — Miscellaneous (Signed)
Summary: Orders Update  Clinical Lists Changes  Orders: Added new Test order of Carotid Duplex (Carotid Duplex) - Signed 

## 2011-01-22 NOTE — Progress Notes (Signed)
Summary: clonazepam  Phone Note Refill Request Message from:  Patient on September 13, 2010 11:03 AM  Refills Requested: Medication #1:  CLONAZEPAM 0.5 MG  TABS Take 1-2 by mouth daily as needed   Last Refilled: 08/17/2010 Refill request from [facility]. [phone] Form is on your desk.   Initial call taken by: Melody Comas,  September 13, 2010 11:03 AM  Follow-up for Phone Call        okay #60 x 0 Follow-up by: Cindee Salt MD,  September 13, 2010 1:13 PM  Additional Follow-up for Phone Call Additional follow up Details #1::        Rx faxed to pharmacy Additional Follow-up by: DeShannon Smith CMA Duncan Dull),  September 13, 2010 3:59 PM    Prescriptions: CLONAZEPAM 0.5 MG  TABS (CLONAZEPAM) Take 1-2 by mouth daily as needed  #60 x 0   Entered by:   Mervin Hack CMA (AAMA)   Authorized by:   Cindee Salt MD   Signed by:   Mervin Hack CMA (AAMA) on 09/13/2010   Method used:   Handwritten   RxID:   [id number]

## 2011-01-22 NOTE — Progress Notes (Signed)
Summary: refill request for clonazepam  Phone Note Refill Request Message from:  Fax from Pharmacy  Refills Requested: Medication #1:  CLONAZEPAM 0.5 MG  TABS Take 1-2 by mouth daily as needed   Last Refilled: 10/26/2010 Faxed request from [facility] is on your desk.  Initial call taken by: Lowella Petties CMA, AAMA,  November 20, 2010 2:34 PM  Follow-up for Phone Call        okay #60 x 1 Follow-up by: Cindee Salt MD,  November 21, 2010 7:47 AM  Additional Follow-up for Phone Call Additional follow up Details #1::        Rx faxed to pharmacy Additional Follow-up by: DeShannon Smith CMA Duncan Dull),  November 21, 2010 8:58 AM    Prescriptions: CLONAZEPAM 0.5 MG  TABS (CLONAZEPAM) Take 1-2 by mouth daily as needed  #60 x 1   Entered by:   Mervin Hack CMA (AAMA)   Authorized by:   Cindee Salt MD   Signed by:   Mervin Hack CMA (AAMA) on 11/21/2010   Method used:   Handwritten   RxID:   [id number]

## 2011-01-22 NOTE — Procedures (Signed)
Summary: Cardiology Device Clinic    Allergies: No Known Drug Allergies   ICD Specifications Following MD:  Lewayne Bunting, MD     Referring MD:  [facility] ICD Vendor:  Medtronic     ICD Model Number:  7232     ICD Serial Number:  [id number] ICD DOI:  11/27/2005     ICD Implanting MD:  Lewayne Bunting, MD  Lead 1:    Location: RV     DOI: 11/27/2005     Model #: 4782     Serial #: [id number]     Status: active  Indications::  NICM   ICD Follow Up Battery Voltage:  3.06 V     Charge Time:  8.77 seconds     Underlying rhythm:  SR ICD Dependent:  No       ICD Device Measurements Right Ventricle:  Amplitude: 8.9 mV, Impedance: 488 ohms, Threshold: 1.0 V at 0.4 msec Shock Impedance: 52/68 ohms   Episodes MS Episodes:  0     Shock:  0     ATP:  0     Nonsustained:  0     Atrial Therapies:  0 Ventricular Pacing:  0.6%  Brady Parameters Mode VVI     Lower Rate Limit:  40      Tachy Zones VF:  200     VT:  250 VIA VF     VT1:  176     Next Cardiology Appt Due:  11/22/2010 Tech Comments:  NORMAL DEVICE FUNCTION.  NO EPISODES SINCE LAST CHECK.  5784 LEAD STABLE. SIC 0  PT HAS MAGNET AND LETTER.  DEMONSTRATED TONES FOR PT.  TURNED LIA ON HIGH.  ROV IN 3 MTHS W/DEVICE CLINIC. Vella Kohler  September 07, 2010 10:17 AM

## 2011-01-24 NOTE — Progress Notes (Signed)
Summary: Rx Clonazepam  Phone Note Refill Request Call back at [phone] Message from:  [facility] on December 28, 2010 3:37 PM  Refills Requested: Medication #1:  CLONAZEPAM 0.5 MG  TABS Take 1-2 by mouth daily as needed   Last Refilled: 12/21/2010 Received faxed refill request please advise.   Method Requested: Fax to Local Pharmacy Initial call taken by: Linde Gillis CMA Duncan Dull),  December 28, 2010 3:38 PM  Follow-up for Phone Call        got 2 month supply about 5 weeks ago Please check to see if he got the refill Cindee Salt MD  December 31, 2010 1:59 PM   yes he did get the refill, per [facility] pt got orig rx on 11/21/10, then refilled on 12/21/2010. They understand he's too early, and made a mistake by sending refill too early. DeShannon Smith CMA Duncan Dull)  December 31, 2010 3:43 PM   Molli Knock will wait for the appropriate time Follow-up by: Cindee Salt MD,  December 31, 2010 3:57 PM

## 2011-02-04 ENCOUNTER — Telehealth: Payer: Self-pay | Admitting: Internal Medicine

## 2011-02-13 NOTE — Progress Notes (Signed)
Summary: clonazepam  Phone Note Refill Request Message from:  Fax from Pharmacy on February 04, 2011 9:53 AM  Refills Requested: Medication #1:  CLONAZEPAM 0.5 MG  TABS Take 1-2 by mouth daily as needed   Last Refilled: 12/21/2010 Refill request from [facility]. [phone]. Fax is on your desk.   Initial call taken by: Melody Comas,  February 04, 2011 10:02 AM  Follow-up for Phone Call        Okay #60 x 1 Follow-up by: Cindee Salt MD,  February 04, 2011 1:25 PM  Additional Follow-up for Phone Call Additional follow up Details #1::        Rx faxed to pharmacy Additional Follow-up by: DeShannon Smith CMA Duncan Dull),  February 04, 2011 2:09 PM    Prescriptions: CLONAZEPAM 0.5 MG  TABS (CLONAZEPAM) Take 1-2 by mouth daily as needed  #60 x 1   Entered by:   Mervin Hack CMA (AAMA)   Authorized by:   Cindee Salt MD   Signed by:   Mervin Hack CMA (AAMA) on 02/04/2011   Method used:   Handwritten   RxID:   [id number]

## 2011-04-10 ENCOUNTER — Other Ambulatory Visit: Payer: Self-pay | Admitting: *Deleted

## 2011-04-10 MED ORDER — CLONAZEPAM 0.5 MG PO TABS: ORAL_TABLET | ORAL | Status: DC

## 2011-04-10 MED ORDER — ENALAPRIL MALEATE 20 MG PO TABS: 20.0000 mg | ORAL_TABLET | Freq: Two times a day (BID) | ORAL | Status: DC

## 2011-04-10 NOTE — Telephone Encounter (Signed)
rx faxed manually to pharmacy 

## 2011-04-10 NOTE — Telephone Encounter (Signed)
Okay #60 x 1 

## 2011-05-07 NOTE — Assessment & Plan Note (Signed)
[facility]                             PULMONARY OFFICE NOTE   NAME:[name], [name]                        MRN:          [id number]  DATE:05/27/2007                            DOB:          01-13-1956    HISTORY OF PRESENT ILLNESS:  Patient is a 55 year old gentleman with a  diagnosis of obstructive sleep apnea whom I have seen in the past.  He  was last seen in September of 2005, and has been lost to follow up.  The  patient was diagnosed with moderate obstructive sleep apnea with a  respiratory disturbance of 25 events per hour and desaturation as low as  84%.  He has been wearing CPAP on a consistent basis, and just recently  got a new mask, as well as filters.  He was having great difficulty with  his sleep despite wearing his CPAP, but he is a little better since  getting the new mask.  Patient states that he is going to bed between 7  and 8 p.m. and getting up between 7:30 and 8 a.m. to start his day.  He  has not been rested in the mornings.  He has been complaining about  severe inappropriate daytime sleepiness, and has been taking multiple  naps each day.  He has been falling asleep trying to watch TV or movies,  and interacting with his family.  Since his last visit with me, he  gained about 60 pounds, and now has lost 45 of it back.  Again, his  symptoms are a little bit better since getting the new mask, but  continues to have issues.   PAST MEDICAL HISTORY:  Significant for:  1. Hypertension.  2. Congestive heart failure and arrhythmias.  3. History of emphysema.  4. History of diabetes.  5. History of dyslipidemia.  6. History of sleep apnea, as stated above.   CURRENT MEDICATIONS:  Not available, since his chart has been removed  from my possession.   PATIENT HAS NO KNOWN DRUG ALLERGIES.   SOCIAL HISTORY:  He is married and has children.  He has a history of  smoking 3 packs per day for 30 years.  He has not smoked since  2005.   FAMILY HISTORY:  Remarkable for his father having heart disease,  otherwise noncontributory.   REVIEW OF SYSTEMS:  As per history of present illness.  Also see patient  intake form documented in the chart.   PHYSICAL EXAMINATION:  GENERAL:  He is an obese male in no acute  distress.  Blood pressure is 96/60.  Pulse 31.  Temperature is 98.  Weight is 254  pounds.  His O2 saturation on room air is 94%.  HEENT:  Pupils equal, round, and reactive to light and accommodation.  Extraocular muscles are intact.  Nares shows deviated septum to the  right with narrowing.  Oropharynx shows moderate elongation of the soft  palate and uvula.  NECK:  Supple without JVD or lymphadenopathy.  He has no palpable  thyromegaly.  CHEST:  Totally clear.  CARDIAC:  Exam reveals regular rate  and rhythm.  No murmurs, rubs, or  gallops.  ABDOMEN:  Soft and non-tender with good bowel sounds.  GENITAL EXAM:  Not done, and not indicated.  RECTAL EXAM:  Not done, and not indicated.  BREAST EXAM:  Not done, and not indicated.  LOWER EXTREMITIES:  Without edema.  Pulses are intact distally.  NEUROLOGIC:  Alert and oriented with no obvious motor deficits.   IMPRESSION:  Obstructive sleep apnea, which I suspect is not being  completely treated at this time.  The patient has just recently gotten  new equipment.  However, it is unclear to me whether his pressure is  really set at the appropriate level with his significant daytime  symptoms.  At this point in time, I would like to recheck his pressure,  and get him to work aggressively on weight loss.   PLAN:  1. Will have the patient  keep up with mask changes on a 6 to 108-month      basis, and also work aggressively on weight loss.  2. Will do an auto titration device at home for the next 2 weeks for      pressure optimization, and I will let him know his pressure.  3. If the patient is doing well after his pressure optimization, he is      to follow up  in 6 months, or sooner if there are problems.     Barbaraann Share, MD,FCCP  Electronically Signed    [facility]/MedQ  DD: 06/04/2007  DT: 06/04/2007  Job #: [id number]   cc:   Rollene Rotunda, MD, [facility]

## 2011-05-07 NOTE — Assessment & Plan Note (Signed)
[facility]                         ELECTROPHYSIOLOGY OFFICE NOTE   NAME:[name], [name]                        MRN:          [id number]  DATE:01/19/2008                            DOB:          November 17, 1956    [name] returns today for follow-up.  He is a very pleasant middle-  aged male with  nonischemic cardiomyopathy, obesity, sleep apnea with  severe LV dysfunction, EF of 30%.  He has class II heart failure.  Mr.  Meng notes that he continues to have chest pain, shortness of breath,  but his main complaint today is that he has worsening dysuria.  He  states that he urinates between 15 and 20 times a day and wakes up  between five and 12 times at night to urinate.  Sometimes he has  incontinence, sometimes he has dribbling, sometimes he has dysuria.  His  medications include Tricor, digoxin 0.125 daily, Bisoprolol 10 mg daily,  aspirin 81 daily, multiple vitamins, enalapril 10 b.i.d., furosemide 10  mg daily.   On physical exam he is a pleasant middle-aged obese man in no distress.  Blood pressure  142/80, the pulse 57 and regular, respirations were 18.  Weight was 259 pounds.  NECK:  Revealed no jugular venous distention.  LUNGS:  Clear bilaterally to auscultation.  No wheezes, rales or rhonchi  are present.  CARDIOVASCULAR:  Regular rate and rhythm.  Normal S1-S2.  EXTREMITIES:  Demonstrated no edema.  Extremities demonstrate trace  peripheral edema.   Interrogation of his defibrillator demonstrates a Medtronic Maximo with  R-waves of 80, the impedance 400, threshold is a volt at 0.2, battery  voltage was 3.14 volts.  There are no intercurrent IC therapies.   IMPRESSION:  1. Nonischemic cardiomyopathy  2. Congestive heart failure.  3. Status post prophylactic ICD insertion.  4. Worsening urologic symptoms consistent with BPH.   DISCUSSION:  Mr. Melhorn is stable from a  cardiovascular perspective.  He has chronic heart failure, not  significantly worse.  What is worse  are his prostate symptoms.  I have referred him to Dr. Bjorn Pippin for  additional evaluation of this as he is quite is severely impaired at  this point.     Doylene Canning. Ladona Ridgel, MD  Electronically Signed    GWT/MedQ  DD: 01/19/2008  DT: 01/19/2008  Job #: [id number]   cc:   Excell Seltzer. Annabell Howells, M.D.  Karie Schwalbe, MD

## 2011-05-07 NOTE — Assessment & Plan Note (Signed)
OFFICE VISIT   [name], [name]  DOB:  Nov 06, 1956                                       03/02/2009  ZOXWR#:[id number]   [name] is a 55 year old gentleman with a history of ischemic  cardiomyopathy, AICD placement, tobacco abuse, obesity, peripheral  vascular disease, type 2 diabetes, hyperlipidemia, and COPD.   He has been seen by me in the office in August 2006 with symptoms of  bilateral calf claudication associated with bilateral superficial  femoral artery occlusive disease.  His ankle brachial indices at that  time were 0.6 and 0.75.  He had a follow-up reevaluation in August 2008  with stable symptoms of bilateral claudication and no significant change  in ankle brachial indices.   He complains at this time of pain in his feet.  He notes a burning  discomfort and tingling of his feet.  He still complains of significant  bilateral calf claudication.  His ankle brachial indices are stable at  0.60 on the right and 0.71 on the left.  Of note he also underwent  carotid Doppler evaluation with minimal right carotid disease and 40-59%  left ICA stenosis.   He continues to smoke up to one pack of cigarettes daily.   PHYSICAL EXAMINATION:  GENERAL:  Evaluation reveals a 55 year old  gentleman who is alert and oriented.  Moderate obesity.  No acute  distress.  VITAL SIGNS:  Blood pressure is 109/67, pulse is 60 per minute.  EXTREMITIES:  His lower extremities reveal intact femoral pulses  bilaterally.  No popliteal, posterior tibial or dorsalis pedis pulses.  There are no ischemic changes in his feet.  No ulceration or gangrene.  He does have mild reduction in sensation of his toes.   [name] has stable peripheral vascular disease.  His recent onset of  new symptoms seems more related to a peripheral neuropathy.  No  significant change in ankle brachial indices over the past 4-5 years.  Unfortunately he does continue to abuse tobacco.  No risk  of limb loss  at this time.  Would not recommend further workup or intervention unless  he should develop limb threatening ischemia.   Balinda Quails, M.D.  Electronically Signed   [facility]H/MEDQ  D:  03/02/2009  T:  03/03/2009  Job:  [id number]   cc:   Rollene Rotunda, MD, [facility]  Karie Schwalbe, MD

## 2011-05-07 NOTE — Assessment & Plan Note (Signed)
[facility]                            CARDIOLOGY OFFICE NOTE   NAME:HODGESNavon, Kotowski                        MRN:          [id number]  DATE:11/09/2007                            DOB:          [birth year]    PRIMARY CARE PHYSICIAN:  Dr. Karie Schwalbe.   REASON FOR PRESENTATION:  Evaluate patient with cardiomyopathy.   HISTORY OF PRESENT ILLNESS:  The patient presents for followup of the  above.  He has done well and has been followed in the Heart Failure  Clinic since I last saw him.  He has had no new complaints.  He gets  some chest discomfort when he lays down at night.  He does not have any  chest discomfort with activity.  He is fatigued; however, he admits that  he does not wear his CPAP and he does not sleep well at night, getting  up to go to the bathroom frequently, despite having stopped his Lasix  and taking it only p.r.n.  He is actually not needing to use Lasix at  all.  He denies any PND or orthopnea.  He has had no palpitations,  syncope or pre-syncope.   PAST MEDICAL HISTORY:  1. Congestive heart failure with a non-ischemic cardiomyopathy      (ejection fraction 50% by echocardiogram and previously 30%).  2. Non-obstructive coronary artery disease on catheterization.  3. Obstructive sleep apnea with CPAP non-compliance.  4. Ongoing tobacco use.  5. Peripheral arterial disease.  6. Status post pacemaker placement of a Medtronic Maximo [phone] single-      chamber defibrillator.  7. Ventricular ectopy.  8. Dyslipidemia.  9. Elevated blood sugars.   ALLERGIES:  No known drug allergies.   CURRENT MEDICATIONS:  1. Tri-Chlor 145 mg daily.  2. Digoxin 0.125 mg daily.  3. Bisoprolol 10 mg daily.  4. Aspirin 81 mg daily.  5. Multivitamin.  6. Fish oil 2000 mg daily.  7. Prilosec 20 mg daily.  8. Enalapril 10 mg twice daily.   REVIEW OF SYSTEMS:  As stated and positive for claudication.  Negative  for other systems.   PHYSICAL  EXAMINATION:  GENERAL:  The patient is in no distress.  VITAL SIGNS:  Blood pressure 148/70, heart rate 48 and regular, weight  254 pounds, body mass index 35.  NECK:  No jugular venous distention at 45 degrees.  Carotid upstroke  brisk and symmetric.  LUNGS:  Clear to auscultation bilaterally.  HEART:  PMI not displaced or sustained.  S1 and S2 within normal limits.  No S3, no murmurs.  ABDOMEN:  Obese with positive bowel sounds.  Normal in frequency and  pitch.  No bruits, no rebound, no guarding, no midline pulsatile mass,  no organomegaly.  SKIN:  No rashes.  EXTREMITIES:  With 2+ pulses, no edema.   ASSESSMENT/PLAN:  1. Cardiomyopathy:  The patient has an improved ejection fraction and      class 1 symptoms at this point.  No further cardiovascular testing      is suggesting.  2. Fatigue:  This is probably related to his  night time urinary      frequency and sleep apnea.  I have encouraged him to follow up with      Dr. Barbaraann Share, and also to consider a urology appointment, if      the urinary frequency is bothersome enough.  He says he did have a      prostate exam and was told that this was not enlarged.  3. Tobacco:  He knows he needs to stop tobacco all together.  4. Hypertension:  Blood pressure is slightly elevated, though this is      not the usual.  He is usually well-controlled.  I will make no      change to his regimen but will need to keep an eye on this.   FOLLOWUP:  I will see him back in about four months, or sooner if  needed.     Rollene Rotunda, MD, [facility]  Electronically Signed    JH/MedQ  DD: 11/09/2007  DT: 11/09/2007  Job #: [id number]

## 2011-05-07 NOTE — Assessment & Plan Note (Signed)
[facility]                          CHRONIC HEART FAILURE NOTE   NAME:[name], [name]                        MRN:          [id number]  DATE:05/12/2007                            DOB:          09/29/1956    PRIMARY CARE PHYSICIAN:  Karie Schwalbe, M.D.   PRIMARY CARDIOLOGIST:  Rollene Rotunda, MD, [facility].   [name] returns today for followup of his congestive heart failure  which is secondary to nonischemic cardiomyopathy  with EF previously  30%, now improved to 50% by echocardiogram.  [name] returns today  for followup.  He states that he does not feel well at all.  He  complains of increased sleeping, increased fatigue, no energy, just does  not feel like doing anything.  He states that he has more bad days than  good days.  Nothing has really changed as far as his medical condition.  He states that he feels spacy at times, not really dizzy, just does  not feel like he is centered.  He states that it is difficult for him to  concentrate.  He has been dieting, but this is not new for him.  He is  doing a low carb diet.  He eats one regular meal a day and then has a  snack for lunch.  Does not ever eat breakfast.  He states compliance  with his CPAP.  He has known history of obstructive sleep apnea;  however, has not seen a pulmonologist in followup since his CPAP was  initiated.  He did see the group out at [facility] recently for chest  discomfort.  Was told that he had acid reflux.  Apparently was put on,  he thinks, Protonix for 10 days with much improvement in his symptoms  and now takes Prilosec over the counter every day.  He states that he is  feeling much better.  We talked about the importance of having something  on his stomach and not going all day without eating, in terms of acid  reflux.  [name] denies any firing from his defibrillator or any  significant palpitations.  In checking a 12-lead EKG today, he has sinus  bradycardia  with first-degree AV block with frequent PVCs, which is not  a new finding for him.  Once again, he is not aware of the palpitations  and denies any frank dizziness or lightheadedness.   PAST MEDICAL HISTORY:  1. Congestive heart failure secondary to nonischemic cardiomyopathy      with an EF currently 50% by echocardiogram.  2. Coronary artery disease, which is nonobstructive.  3. Obstructive sleep apnea with CPAP use.  4. Peripheral arterial disease status post arteriogram by Dr. Samule Ohm      with medical management.  5. Status post placement of a Medtronic Maximo, model 7232, single      chamber defibrillator implanted in December, 2006.  6. Dyslipidemia.  7. History of mildly elevated glucose levels.  Patient states that he      checks his CBGs twice a week and the numbers run in the 80s to 90s.  8. History of tobacco abuse.  Patient smoked greater than 40 years at      three packs a day.   REVIEW OF SYSTEMS:  As stated above.   CURRENT MEDICATIONS:  1. Tricor 145 mg daily.  2. Bisoprolol 10 mg daily.  3. Aspirin 81 mg daily.  4. Lasix 20 mg daily.  5. Potassium 10 mEq daily.  6. Prilosec daily.  7. Enalapril 20 mg b.i.d.  8. Digoxin 0.125 mg daily.   PHYSICAL EXAMINATION:  VITAL SIGNS:  Weight 261 pounds.  Patient is  satting at 94% on room air.  Blood pressure 122/64, heart rate  originally documented at 40.  Apical pulse was 76.  Orthostatic vitals  obtained:  Lying heart rate 56 with a blood pressure of 124/62.  Sitting  heart rate 42 with a blood pressure of 112/70.  Standing initially heart  rate 44 with a blood pressure of 124/78.  Patient complained of mild  lightheadedness.  At 2 minutes, heart rate of 48 with a blood pressure  of 140/84.  Patient states that he feels a spacy sensation.  A five  minutes, heart rate 46 with a blood pressure of 104/68.  Patient  lightheaded; however, had just urinated.  GENERAL:  [name] is in no acute distress.  He is a  55 year old  Caucasian gentleman who looks to feel poor today, a rather flat affect.  Also, at a 45 degree angle, patient has no jugular venous distention.  LUNGS:  Distant breath sounds throughout without rhonchi, rales, or  wheezing.  CARDIOVASCULAR:  An S1 and S2 with a regular rate and rhythm.  ABDOMEN:  Soft and nontender.  Positive bowel sounds.  Obese.  SKIN:  Warm and dry.  LOWER EXTREMITIES:  Without clubbing, cyanosis or edema.  NEUROLOGIC:  Patient is alert and oriented x3.   IMPRESSION:  Increased fatigued and decreased energy in the setting of  nonischemic cardiomyopathy with ejection fraction currently 50%.  Adjustments have been made in medications in the past.  The patient's  enalapril was dropped from 30 mg b.i.d. to 20 mg b.i.d. with mild  improvement temporarily in symptoms.  At last visit, I added digoxin  0.125 mg daily.  Patient states that he could not really tell any  improvement in his symptoms with the addition of the digoxin.  EKG:  Really no change in frequent premature ventricular contractions.  I am  going to check a digoxin level on patient today.  Recheck his kidney  function.  Orthostatics:  Patient really did not tilt.  The other  consideration is maybe his symptoms are not coming from his heart  failure so much as the possibility of his sleep apnea.  Patient states  that he has never followed up with pulmonary after his initial visit and  initiation of his CPAP.  I am going to schedule the patient for CPX test  and then have him follow up for pulmonary for re-evaluation.  Hopefully,  we  can pinpoint as to why the patient feels so bad, since he as significant  improvement in his ejection fraction and no signs of volume overload.      Dorian Pod, ACNP  Electronically Signed      Rollene Rotunda, MD, [facility]  Electronically Signed   MB/MedQ  DD: 05/12/2007  DT: 05/12/2007  Job #: [id number]  cc:   Karie Schwalbe, MD

## 2011-05-07 NOTE — Assessment & Plan Note (Signed)
[facility]                            CARDIOLOGY OFFICE NOTE   NAME:[name], Schmuck                        MRN:          [id number]  DATE:02/25/2008                            DOB:          [birth year]    PRIMARY DOCTOR:  Dr. [name].   REASON FOR PRESENTATION:  Evaluate the patient with cardiomyopathy,  recent hospitalization with chest pain.   HISTORY OF PRESENT ILLNESS:  The patient was admitted on February 8 with  chest pain.  He had an upper respiratory illness and took an over-the-  counter medication.  Said he termed blue.  In the hospital, he was to  have atypical chest pain.  He did have some wheezing and respiratory  complaints, and was treated with a Zithromax course and steroids.  He  sense is back to baseline.  He has had no new shortness of breath.  Denies any PND or orthopnea.  He has had no chest discomfort, neck or  arm discomfort.  He has had no palpitation, presyncope, or syncope.   PAST MEDICAL HISTORY:  Congestive heart failure, nonischemic myopathy  (EF 50% by echocardiogram.  Previously 30%.), nonobstructive coronary  disease on catheterization, obstructive sleep apnea with CPAP  noncompliance, ongoing tobacco use, peripheral arterial disease, status  post single chamber Medtronic Maxima number 7232 defibrillator  placement, ventricular ectopy, dyslipidemia, elevated blood sugars.   ALLERGIES:  None.   MEDICATIONS:  1. Tricor 45 mg daily.  2. Digoxin 0.125 mg daily.  3. Bisoprolol 10 mg daily.  4. Aspirin 81 mg daily.  5. Multivitamin.  6. Enalapril 10 mg b.i.d.  7. Klor-Con 10 mEq every other day.   REVIEW OF SYSTEMS:  As stated in the HPI and otherwise negative for  other systems.   PHYSICAL EXAMINATION:  The patient is in no distress.  Blood pressure 128/70, heart rate 60 and regular, weight 260 pounds,  body mass index 35.  HEENT:  Eyelids unremarkable.  Pupils are equal, round, and reactive to  light and  accommodation.  Fundi are not visualized.  NECK:  No jugular venous distension at 45 degrees, carotid upstroke  brisk and symmetric, no bruits, thyromegaly.  LYMPHATICS:  No adenopathy.  LUNGS:  Clear to auscultation bilaterally.  BACK:  No costovertebral angle tenderness.  CHEST:  Well-healed ICD pocket.  HEART:  PMI not displaced or sustained, S1 and S2 within normal limits,  no S3, no S4, no clicks, rubs, murmurs.  ABDOMEN:  Obese, positive bowel sounds, normal in frequency and pitch,  no bruits, rebound, guarding.  No midline pulsatile mass, hepatomegaly,  splenomegaly.  SKIN:  No rashes, no nodules.  EXTREMITIES:  With 2+ pulses , no edema.   ASSESSMENT/PLAN:  1. Cardiomyopathy.  The patient had an improved ejection fraction.  He      has class I symptoms.  He will continue with the medications as      listed.  2. Tobacco.  I counseled him on the need to stop smoking.  He quit      cold [place] in the past.  He will try this  again.  (Greater than 3      minutes counseling.)  3.  Hypertension.  Blood pressure is well-      controlled and he will continue the medications as listed.  3. Obesity.  Understands the need to lose weight with diet and      exercise.  4. Followup.  See him back in 6 months or sooner if needed.     Rollene Rotunda, MD, [facility]  Electronically Signed    JH/MedQ  DD: 02/25/2008  DT: 02/25/2008  Job #: [id number]   cc:   Karie Schwalbe, MD

## 2011-05-07 NOTE — Discharge Summary (Signed)
[name], DUCE NO.:  [id number]   MEDICAL RECORD NO.:  [id number]          PATIENT TYPE:  INP   LOCATION:  3707                         FACILITY:  [facility]   PHYSICIAN:  Valerie A. Felicity Coyer, MDDATE OF BIRTH:  [birth year]   DATE OF ADMISSION:  01/31/2008  DATE OF DISCHARGE:  02/02/2008                               DISCHARGE SUMMARY   DISCHARGE DIAGNOSES:  1. Atypical chest pain in the setting of acute chronic obstructive      pulmonary disease exacerbation.  2. Chronic systolic heart failure with history of nonischemic      cardiomyopathy and ejection fraction of 30% per 2005      catheterization, clinically compensated.  Plan for outpatient      follow-up with Rollene Rotunda, MD, [facility] is scheduled.  3. Hypertension.  4. Obstructive sleep apnea on CPAP q.h.s.  5. Dyslipidemia.  6. Diabetes type 2.   HISTORY OF PRESENT ILLNESS:  The patient is a 55 year old male who was  admitted on January 31, 2008, with chief complaint of chest pain.  He  noted that he had at that time a head cold and described some sinus  pressure, congestion, and productive cough.  He developed pleuritic  chest pain which was substernal in nature on the day of admission and  felt that this pain might have radiated to his left arm and neck.  It  was associated with some shortness of breath and diaphoresis and he came  to the emergency department for further evaluation.   PAST MEDICAL HISTORY:  1. Nonischemic cardiomyopathy with an ejection fraction of 30% by      cardiac catheterization in 2005.  Recent two-dimensional      echocardiogram in 2007 showed normal LV systolic function.  2. Status post AICD and pacemaker implantation.  3. Obstructive sleep apnea on CPAP.  4. Peripheral arterial disease.  5. Dyslipidemia.  6. Left heart catheterization in 2005 with moderate nonobstructive      coronary artery disease.  7. Hypertension.   HOSPITAL COURSE:  Problem 1.  Atypical chest pain  secondary to acute  COPD exacerbation.  The patient developed significant wheezing.  He was  started on oral prednisone and Duonebs with improvement in his  respiratory status.  His wheezing has currently resolved.  Plan at this  time is to discharge the patient to home on a prednisone taper.  We will  continue Zithromax for presumed bronchitis, although the patient's chest  x-ray noted mild bronchitic changes.  The patient is afebrile without a  white count.  We will also give the patient a prescription for a  Combivent meter dose inhaler at the time of discharge.  Also of note his  serial cardiac enzymes were negative and his BNP was 77.   DISCHARGE MEDICATIONS:  1. Zithromax 500 mg p.o. daily through February 13, and then stop.  2. TriCor 145 mg p.o. daily.  3. Zebeta 10 mg p.o. daily.  4. Aspirin 81 mg p.o. daily.  5. Enalapril 10 mg p.o. b.i.d.  6. Potassium 10 mEq p.o. every other day.  7.  Combivent inhaler two puffs every 6 hours as needed.  8. Prednisone 10 mg five tablets on February 11 and February 12, four      tablets on February 13 and February 14, three tablets on February      15 and February 16, two tablets on February 17 and February 18, one      tablet on February 19 and February 20, and then stop.   DISCHARGE LABORATORY DATA:  Serial cardiac enzymes were negative.  Digoxin less than 0.2.  Hemoglobin 9, hematocrit 0.83.   FOLLOWUP:  The patient is scheduled for follow-up with Dr. [name] on  Monday, February 16, at 2:45 p.m.      Sandford Craze, NP      Raenette Rover. Felicity Coyer, MD  Electronically Signed    MO/MEDQ  D:  02/02/2008  T:  02/03/2008  Job:  [id number]   cc:   Karie Schwalbe, MD

## 2011-05-07 NOTE — Assessment & Plan Note (Signed)
[facility]CARE                            CARDIOLOGY OFFICE NOTE   [name], [name]                        MRN:          [id number]  DATE:08/09/2008                            DOB:          February 29, 1956    PRIMARY CARE PHYSICIAN:  Karie Schwalbe, MD   RECENT FOR PRESENTATION:  Evaluate the patient with cardiomyopathy and  peripheral vascular disease.   HISTORY OF PRESENT ILLNESS:  The patient presents for followup.  He is  55 years old.  Since I last saw him, he has had no further  hospitalizations such as he had prior to that visit.  He has not had any  of the chest discomfort that he was experiencing then.  However, he is  having episodes of fatigue.  This happens every day.  He says he sleeps  well at night.  He wears CPAP.  He is taking some medications to help  him sleep.  He wakes up and feels relatively good.  However, by 11, he  is exhausted and has to fall asleep.  He says it comes on rather  abruptly.  He is not describing palpitations, presyncope or syncope.  He  is not having any chest discomfort, neck or arm discomfort.  He is not  describing any shortness of breath, PND or orthopnea.  He goes to sleep  and sleeps for several hours and then feels okay,  actually he goes back  to bed again.  He has checked his blood sugar and has not been dropping.  He has not checked his blood pressure, however.  He was on some new  bladder medications and tried coming off of these and being switched to  different drugs and this was not the cause.   PAST MEDICAL HISTORY:  Congestive heart failure (the patient had  nonischemic cardiomyopathy with an EF that was 30%, then 50% the most  recent study in 2007), nonobstructive coronary disease, obstructive  sleep apnea with CPAP, ongoing tobacco use, peripheral arterial disease,  status post single-chamber Medtronic maximal ICD #7232, frequent  ventricular ectopy, dyslipidemia, elevated blood sugars.   ALLERGIES:  None.   MEDICATIONS:  1. Tricor 145 mg daily.  2. Digoxin 0.125 mg daily.  3. Bisoprolol 10 mg daily.  4. Aspirin 81 mg daily.  5. Multivitamin.  6. Enalapril 10 mg b.i.d.  7. Klor-Con 10 mEq every other day.  8. Oxybutynin.   REVIEW OF SYSTEMS:  As stated in the HPI and otherwise positive for  numbness in his hands, frequent urination secondary to apparent BPH and  small bladder.  Negative for all other systems.   PHYSICAL EXAMINATION:  GENERAL:  The patient is in no distress.  VITAL SIGNS:  Blood pressure 124/80, heart rate 62 and regular, weight  262 pounds, body mass index 35.  HEENT:  Eyelids are unremarkable, pupils equal, round and reactive to  light, fundi not visualized, oral mucosa remarkable.  NECK:  No jugular distention at 45 degrees, carotid upstroke brisk and  symmetric, no bruits, no thyromegaly.  LYMPHATICS:  No cervical, axillary or inguinal adenopathy.  LUNGS:  Clear to auscultation bilaterally.  BACK:  No costovertebral tenderness.  CHEST:  Well-healed ICD pocket.  HEART:  PMI not displaced or sustained, S1 and S2 within normal limits,  no S3, no S4, no clicks, no rubs, no murmurs.  ABDOMEN:  Obese, positive bowel sounds normal in frequency and pitch, no  bruits, no rebound, no guarding or midline pulsatile mass, no  hepatomegaly, no splenomegaly.  SKIN:  No rashes, no nodules.  EXTREMITIES:  2+ upper pulses, absent dorsalis pedis and post tibialis  bilaterally, no cyanosis, no clubbing, no edema.  NEURO:  Oriented to person, place, and time.  Cranial nerves II through  XII are grossly intact, and motor grossly intact.   EKG sinus bradycardia, rate 56, frequent premature ventricular  contractions, left ventricle hypertrophy by voltage criteria, first-  degree AV block, no acute ST-T wave changes, no change from previous  EKGs.   ASSESSMENT/PLAN:  1. Fatigue.  The patient's fatigue does not have a clear etiology.  I      am going to check an  echocardiogram to make sure his ejection      fraction is still improved.  We are going to him start taking his      bisoprolol at night to see if that might make any difference at      all.  He says he has got good sleep hygiene and is wearing his CPAP      so that cannot be implicated.  We will see how he does going      forward with med change above.  I really do not think this is      related to his bradycardia or his ectopy which has been      longstanding.  2. Peripheral vascular disease.  He is having increasing claudication.      We will go ahead check ABIs.  He has been medically managed for      this in the past.  He will continue with risk reduction.  3. Defibrillator.  This has been interrogated and he is up-to-date      with this.  4. Tobacco.  We discussed (greater than 3 minutes) the need to stop      smoking.  He is going to try cold [place] which worked for a couple      years before.  5. Obesity.  He has lost some weight and he is dieting.  He saw a      nutritionist and very proud of this.  He is to continue with weight      loss with increased diet and exercise.  6. Followup.  We will see him back in no longer than 6 months and      sooner if he has any new problems.     Rollene Rotunda, MD, [facility]  Electronically Signed    JH/MedQ  DD: 08/09/2008  DT: 08/10/2008  Job #: [id number]   cc:   Karie Schwalbe, MD

## 2011-05-07 NOTE — Assessment & Plan Note (Signed)
[facility]                          CHRONIC HEART FAILURE NOTE   NAME:[name], [name]                        MRN:          [id number]  DATE:10/21/2007                            DOB:          May 01, 1956    [name] returns today after a 4 month lapse in care here in the Heart  Failure Clinic.   PRIMARY CARE PHYSICIAN:  Dr. Tillman Abide.   PRIMARY CARDIOLOGIST:  Dr. Rollene Rotunda.   [name] has a history of congestive heart failure secondary to  nonischemic cardiomyopathy with an EF currently 50% by echocardiogram,  previously as low as 30%.  When I saw [name] back in June he had  ongoing fatigue, dyspnea.  Followed up with Dr. Shelle Iron for further  evaluation of obstructive sleep apnea at that time.  Patient states  today he is unable to tolerate his CPAP and has not been wearing it and  he needs to make an appointment again with Dr. Shelle Iron.  [name] looks  extremely fatigued and worn out today.  He states that he is not  sleeping well also because of increased nocturia which is becoming more  frequent.  He states that he was actually up 12 times to urinate after  he went to bed last night.  He saw Dr. [name] last week and had blood  work done, results of which I have at this time and will be placed in  his chart.  H&H stable at 15.9 and 35.5, BUN and creatinine 6 and 0.9,  potassium 4.4, hemoglobin A1c 5.7, PSA 1.20.  Patient also had TSH of  1.40 and a lipid panel was drawn which will be available for Dr.  Antoine Poche to review.  [name] also has a history of peripheral  vascular disease, has been evaluated by Dr. Samule Ohm in the past also  apparently has been evaluated by a vascular surgeon, patient was seen by  Dr. Liliane Bade back in 2006 for bilateral claudication associated with  chronic peripheral vascular disease.  Dr. Madilyn Fireman felt there was no  revascularization options at present for the left lower extremity.  He  recommended  conservative therapy, he did not recommend operative  management at this time.  [name] has been trying to lose weight  since I last saw him with diet changes.  He states he still can not walk  very far without experiencing extreme discomfort in his lower  extremities but he does try to remain active and stay out and do things  with limitations secondary to lower extremity pain.  I also had a CPX  done on [name] back earlier this year.  [name] had submaximal  exercise secondary to lower extremity discomfort.  There is also  evidence for worsening circulatory limitation in the face of  chronotropic incompetence.  Also, patient's obesity and related  ventilatory limitation were likely playing a role in his exercise  intolerance.  [name] was a previous heavy smoker, he stopped for 3  years but tells me now that he is sneaking a cigarette every once and  a  while and his wife is not aware.   PAST MEDICAL HISTORY:  1. Congestive heart failure secondary to nonischemic cardiomyopathy      with EF currently 50% by echocardiogram, previously 30%.  2. Coronary artery disease status post cardiac catheterization with      nonobstructive CAD.  3. Obstructive sleep apnea with CPAP noncompliance.  4. Ongoing tobacco use.  5. Peripheral arterial disease previously followed by Dr. Samule Ohm and      evaluated by Dr. Liliane Bade with management for conservative      measures at this time.  6. Status post placement of a Medtronic Maximo 7232 single chamber      defibrillator.  7. Ventricular ectopy.  8. Dyslipidemia.  9. History of elevated glucose levels.  10.Increased nocturia.  11.Decreased mobility secondary to claudication symptoms.   REVIEW OF SYSTEMS:  As stated above.   CURRENT MEDICATIONS:  1. Tricor 145.  2. Digoxin 0.125.  3. Klor-Con 10 mEq every other day.  4. Bisoprolol 10.  5. Aspirin 81.  6. Multivitamin daily.  7. Fish oil 2000 mg daily.  8. Prilosec p.r.n.  9.  Enalapril 10 mg b.i.d.  10.Furosemide 10 mg daily.   PHYSICAL EXAMINATION:  Weight 254, blood pressure 136/82 with a heart  rate of 65.  [name] is in no acute distress.  No signs of jugular  vein distention at 45 degree angle.  LUNGS:  He has scattered rhonchi in the right lobes, otherwise clear to  auscultation.  CARDIOVASCULAR:  Reveals an S1-S2, frequent irregular beat.  ABDOMEN:  Soft, nontender, positive bowel sounds.  LOWER EXTREMITY:  Without clubbing, cyanosis or edema.  NEUROLOGICALLY: Alert and oriented with a flat affect.   A 12 lead EKG showing sinus brady with a first degree AV block with  frequent PVCs at a rate of 52.  Mr. Shell shows no signs of volume  overload at this time.  I am going to discontinue his Lasix and  potassium and use it p.r.n.  [name] has frequent PVCs, actually  appears to be bigeminy at this time.  I am going to have him follow up  with Dr. Antoine Poche for routine cardiology visit and further evaluation.  Encouraged patient to follow up with Dr. Shelle Iron for re-evaluation of  his sleep apnea and follow up with Dr. [name] concerning his increased  nocturia.  Once again encouraged smoking cessation with patient.      Dorian Pod, ACNP  Electronically Signed      Bevelyn Buckles. Bensimhon, MD  Electronically Signed   MB/MedQ  DD: 10/21/2007  DT: 10/21/2007  Job #: [id number]   cc:   Karie Schwalbe, MD

## 2011-05-07 NOTE — H&P (Signed)
NAMEABDIAS, [name] NO.:  [id number]   MEDICAL RECORD NO.:  [id number]          PATIENT TYPE:  INP   LOCATION:  3707                         FACILITY:  [facility]   PHYSICIAN:  Therisa Doyne, MD    DATE OF BIRTH:  [birth year]   DATE OF ADMISSION:  01/31/2008  DATE OF DISCHARGE:                              HISTORY & PHYSICAL   PRIMARY CARE Eara Burruel:  Karie Schwalbe, MD   CHIEF COMPLAINT:  Chest pain.   HISTORY OF PRESENT ILLNESS:  A 55 year old white male with a past  medical history significant for nonobstructive coronary artery disease  by left heart catheterization in 2005 as well as cardiomyopathy with an  ejection ranging from 30 to 50%, status post AICD, hypertension, and  hyperlipidemia who presents with chest pain.  The patient reports having  a head cold.  He describes having sinus pressure, congestion and  productive cough; however, this evening he developed pleuritic chest  pain which is substernal in nature.  He thinks that this may have  radiated to his left arm and neck.  It was associated with some  shortness of breath and diaphoresis so he came to the emergency  department for evaluation.  Currently he is chest pain free except he  does note having chest pain when he coughs.   He denies any paroxysmal nocturnal dyspnea, orthopnea or lower extremity  edema.  He denies any recent anginal symptoms.  He has tried Mucinex for  his cough.   PAST MEDICAL HISTORY:  1. Nonischemic cardiomyopathy with an ejection fraction of 30% by      cardiac catheterization in 2005; however, recent echocardiogram in      2007 showed a low normal left ventricular systolic function.  2. Status post AICD and pacemaker implantation.  3. Obstructive sleep apnea on CPAP.  4. Peripheral arterial disease.  5. Dyslipidemia.  6. Left heart catheterization in 2005 showed moderate nonobstructive      coronary artery disease.  7. Hypertension.   SOCIAL HISTORY:  1. The  patient lives with his wife.  2. He has a history of tobacco abuse; 1 pack per day for 30 years and      is currently smoking.  3. He denies alcohol or drugs.   FAMILY HISTORY:  Positive for a stroke in his father.   MEDICATIONS:  1. TriCor 145 mg daily.  2. Digoxin 0.125 mg daily.  3. Bisoprolol 10 mg daily.  4. Aspirin 81 mg daily.  5. Enalapril 10 mg b.i.d.  6. Potassium chloride 10 mEq every other day.  7. Multivitamin.   ALLERGIES:  No known drug allergies.   REVIEW OF SYSTEMS:  All systems reviewed and are negative except as  mentioned above in History of Present Illness.   PHYSICAL EXAM:  VITAL SIGNS:  Temperature 98.4, blood pressure 120/52,  pulse 74, respirations 22, oxygen saturation 96% on room air.  GENERAL:  No acute distress.  HEENT:  Normocephalic, atraumatic.  Pupils are equally round, react to  light and accommodation.  Extraocular movements are intact.  Oropharynx  is  pink and moist without any lesions.  NECK:  Supple.  No lymphadenopathy, no jugular venous distention, no  masses.  CARDIOVASCULAR:  Regular rate and rhythm.  No murmurs, rubs or gallops.  CHEST:  Rhochi bilaterally, no crackles, no wheezing, good air movement.  ABDOMEN:  Positive for bowel sounds, soft, nontender, nondistended.  EXTREMITIES:  No clubbing, cyanosis or edema.  NEUROLOGIC:  Cranial nerves II-XII are grossly intact with no focal  deficit.  SKIN:  No rashes.  BACK:  No CVA tenderness.   LABORATORY VALUE:  White blood cell count 8.8, hemoglobin 15.5,  platelets 225,000.  CMP within normal limits.  BNP 77, cardiac enzymes  negative x1.   STUDIES:  1. Chest x-ray showed no acute cardiopulmonary disease but did reveal      mild bronchitic changes.  2. Electrocardiogram showed sinus rhythm at 72 bpm with premature      ventricular contractions, first degree block as well as nonspecific      ST-T wave changes in the inferior leads which could be related to      ischemia vs.  Digoxin.   ASSESSMENT AND PLAN:  A 55 year old white male with a past medical  history significant for nonobstructive coronary artery disease,  congestive heart failure and an active upper respiratory tract infection  who presents with pleuritic chest pain which is worse with coughing.   1. Will admit the patient to the [facility] under Dr.      Con Memos care.   1. Chest pain.  This is likely related to coughing from his upper      respiratory tract infection; however, he does have nonspecific ST      changes in the inferior leads on electrocardiogram.  Differential      diagnosis includes unstable angina and becasue of this, we will      admit the patient and rule him out for myocardial infarction with      serial cardiac enzymes.  We will monitor him on telemetry.  We will      continue him on his home medications of aspirin, bisoprolol, TriCor      and enalapril.  We will not anticoagulate the patient nor use a 2B3      inhibitor unless the patient rules in for myocardial infarction.      We will treat the patient's upper respiratory tract infection,      which is likely consistent with a tracheobronchitis, with      azithromycin.  If he rules out, it would be prudent to obtain an      outpatient functional evaluation to rule out ischemia.   1. Congestive heart failure.  The patient appears euvolemic at this      time.  Will continue him on his home medications of bisoprolol,      enalapril and digoxin.  Will check a digoxin level.   1. Hyperlipidemia.  Continue TriCor.   1. Fluids, electrolytes, nutrition.  Saline lock IV fluids.      Electrolytes are stable.  Regular diet.   1. Deep vein thrombosis prophylaxis, Lovenox.      Therisa Doyne, MD  Electronically Signed     SJT/MEDQ  D:  01/31/2008  T:  02/01/2008  Job:  [id number]

## 2011-05-07 NOTE — Assessment & Plan Note (Signed)
OFFICE VISIT   [name], [name]  DOB:  1956-02-19                                       08/06/2007  WJXBJ#:[id number]   The patient was seen for reevaluation today of bilateral lower extremity  claudication.  He is a 55 year old gentleman with a history of  nonischemic cardiomyopathy.  His current symptoms of claudication occur  bilaterally in the calf after walking approximately 100 yards.  He has  no night pain or rest pain.  Lower extremity arteriography has revealed  common iliac ectasia, long segment occlusion of the right superficial  femoral artery from origin to adductor canal with 3 vessel right lower  extremity run off.  Left lower extremity reveals a patent superficial  femoral artery with mild to moderate diffuse disease, patent anterior  tibial artery with tibioperoneal truncal occlusion and collateralization  to the distal posterior tibial artery.  The ankle-brachial indices  measure 0.6 on the right and 0.75 on the left.   The patient is a 55 year old gentleman who appears older than his stated  age.  Blood pressure 118/71, pulse 62 per minute, regular, respirations  18 per minute.  His lower extremity evaluation reveals intact femoral  pulses bilaterally, no popliteal, posterior tibial or dorsalis pedis  pulses.  One plus ankle edema.  No ischemic skin changes.   Further discussion with the patient, he does have limited exercise  tolerance secondary to his cardiomyopathy.  His current symptoms do not  significantly limit his lifestyle.  I continue to  recommend conservative medical management unless there is a significant  deterioration in symptoms.  Return p.r.n. for symptom change.   Balinda Quails, M.D.  Electronically Signed   PGH/MEDQ  D:  08/06/2007  T:  08/07/2007  Job:  [id number]   cc:   Rollene Rotunda, MD, [facility]

## 2011-05-07 NOTE — Assessment & Plan Note (Signed)
[facility]                          CHRONIC HEART FAILURE NOTE   NAME:[name], [place]                        MRN:          [id number]  DATE:06/01/2007                            DOB:          1956/09/20    PRIMARY CARE PHYSICIAN:  Dr. Tillman Abide.   PRIMARY CARDIOLOGIST:  Dr. Rollene Rotunda.   [name] returns today for followup of his congestive heart failure  which is secondary to nonischemic cardiomyopathy with an EF currently  50% by echocardiogram, previously as low as 30%.  When I last saw Mr.  [name] in May he complained of ongoing fatigue, decreased energy with  really no improvement even though his ejection fraction has improved,  almost back to normal.  I started him on digoxin at that appointment and  scheduled him for a CPX test.  He states he just had the CPX test this  morning, results of which I do not have available at this time.  He does  report some mild improvement in his decreased energy after the  initiation of the digoxin.  He continues to have each good day followed  by a bad day.  He complains of some intermittent dizziness and ongoing  weakness and fatigue.  He denies any depression with his symptoms but I  suspect that there is some underlying depression involved.  He states he  saw Dr. Shelle Iron last week as I suggested at his last appointment for  followup of his obstructive sleep apnea, as he has not been re-evaluated  since CPAP was initiated.  [name] reports he is going to be trying a  new machine/mask out to see if it works better.  He denies any  orthopnea, PND, peripheral edema; any palpitations.  No firing from his  defibrillator.   PAST MEDICAL HISTORY:  1. Congestive heart failure secondary to a nonischemic cardiomyopathy      with EF current 50% by echocardiogram, previously as low as 30%.  2. Coronary artery disease which is nonobstructive.  3. Obstructive sleep apnea with CPAP use, followed by Dr.  Shelle Iron.  4. Peripheral arterial disease, status post arteriogram by Dr. Samule Ohm      with medical management.  5. Status post placement of a Medtronic Maximo Model 7232 single      chamber defibrillator in December of 2006.  6. Dyslipidemia.  7. History of mildly elevated glucose levels.  8. History of tobacco use.   REVIEW OF SYSTEMS:  As stated above.   CURRENT MEDICATIONS:  1. Tricor 145 mg daily.  2. Enalapril 20 mg b.i.d.  3. Digoxin 0.125 mg daily.  4. Klor-Con 10 mEq every other day.  5. Furosemide 20 mg daily.  6. Bisoprolol 10 mg daily.  7. Aspirin 81 mg daily.  8. Multivitamin daily.  9. Fish oil 2 grams daily.  10.Prilosec 20 mg daily.  11.P.r.n. medications include clonazepam, albuterol inhaler.   PHYSICAL EXAMINATION:  Weight is 262, blood pressure is 84/60, manual  blood pressure 82/58 although [name] denies any lightheadedness or  dizziness at this time, heart rate of 52.  He has no obvious jugular  vein distention at 45 degree angle.  LUNGS:  Clear to auscultation, somewhat distant breath sounds.  CARDIOVASCULAR:  Exam reveals an S1 and S2, regular rate and rhythm.  ABDOMEN:  Soft, nontender, positive bowel sounds, obese.  LOWER EXTREMITIES:  Without clubbing, cyanosis, or edema.  NEUROLOGICALLY:  Patient is alert and oriented x3.  He has a somewhat  flat affect today.   IMPRESSION:  Congestive heart failure, no signs of volume overload at  this time.  However, patient with ongoing fatigue, decreased energy with  Class III symptoms.  Status post CPX test today, results of which are  pending.  The patient with mild improvement in symptoms with initiation  of digoxin.  The patient also hypotensive at this time which is most  likely contributing to his symptoms.  I am going to decrease his  Enalapril to 10 mg p.o. b.i.d. and decrease his Lasix to 10 mg daily,  see if this improves his symptoms at all.  The patient  reminded to  weigh daily and to report any  weight gain of 3 pounds in 2 days.  I will  discuss his CPX with Dr. Gala Romney and Dr. Antoine Poche once results are  available and we will decide what further measures to take.      Dorian Pod, ACNP  Electronically Signed      Bevelyn Buckles. Bensimhon, MD  Electronically Signed   MB/MedQ  DD: 06/01/2007  DT: 06/01/2007  Job #: [id number]   cc:   Karie Schwalbe, MD

## 2011-05-10 NOTE — Assessment & Plan Note (Signed)
[facility]                           ELECTROPHYSIOLOGY OFFICE NOTE   NAME:[name], [name]                        MRN:          [id number]  DATE:09/18/2006                            DOB:          08-15-1956    Mr. Defalco is seen in device clinic today, September 18, 2006, for routine  followup of his Medtronic Maximo, model 7232, single chamber defibrillator,  implanted December 2006, for nonischemic cardiomyopathy.  Upon interrogation  there are no episode stored in the device.  Battery voltage is 3.19 volts  with the last recorded charge time of 7.72 seconds.  In the right ventricle  intrinsic amplitude is 10.8 millivoltss with an impedance of 416 ohms and  threshold of 1 volt at 0.3 milliseconds.  High voltage lead impedance is 51  ohms.  Wavelet was turned on at this visit.  No other changes were made.  Mr. Conchas will complete [facility] transmission in 3, 6, and 9 months and be  seen by Dr. Ladona Ridgel in 1 year's time for followup.      ______________________________  Cleatrice Burke, RN    ______________________________  Doylene Canning. Ladona Ridgel, MD    CF/MedQ  DD:  09/18/2006  DT:  09/20/2006  Job #:  [id number]

## 2011-05-10 NOTE — Op Note (Signed)
NAMESKEETER, SHEARD NO.:  [id number]   MEDICAL RECORD NO.:  [id number]          PATIENT TYPE:  INP   LOCATION:  4707                         FACILITY:  [facility]   PHYSICIAN:  Doylene Canning. Ladona Ridgel, M.D.  DATE OF BIRTH:  March 29, 1956   DATE OF PROCEDURE:  11/27/2005  DATE OF DISCHARGE:                                 OPERATIVE REPORT   PROCEDURE PERFORMED:  Implantation of a single chamber ICD with  defibrillation threshold testing.   INDICATIONS:  Nonischemic cardiomyopathy, class 2 CHF EF 25% (longstanding)  despite maximal medical therapy.   I. INTRODUCTION:  The patient is a 55 year old male with longstanding  congestive heart failure. An EF of 25%. He has class 2 symptoms despite beta  blockers, ACE inhibitors, and diuretics. He is now referred for prophylactic  ICD implantation secondary to his longstanding heart failure and severe LV  dysfunction.   II. PROCEDURE:  After informed consent was obtained, the patient was taken  diagnostic CP lab in the fasting state. After the usual preparation and  draping, intravenous fentanyl and metaxalone were given for sedation; 30 mL  of lidocaine was infiltrated into the left infraclavicular region. An 8 cm  incision was carried out over this region. Electrocautery was utilized to  dissect down to the fascial plane; 10 mL of contrast was injected into the  left upper extremity venous system demonstrating a patent left subclavian  vein. It was subsequently punctured and the Medtronic model 6949, 65-cm,  active fixation defibrillation lead serial number [id number] was advanced  into the right ventricle.   Mapping was carried out and at the final site on the RV septum the R waves  measured 12 mV with a pacing impedance of 919 ohms; and pacing threshold of  0.9 volts of 0.5 milliseconds with lead actively fixed. Ten volt pacing did  not stimulate the diaphragm. With the defibrillation lead in satisfactory  position, it was  secured to subpectoralis fascia with a figure-of-eight silk  suture. The sewing sleeve was also secured with a silk suture.  Electrocautery was utilized to make a subcutaneous pocket. Kanamycin  irrigation was utilized to irrigate the pocket. Electrocautery was utilized  to assure hemostasis.   Next, the Medtronic maximal VR model 7232, single-chamber defibrillator was  connected to the defibrillation lead and placed in the subcutaneous pocket.  Generator was secured with a silk suture. Defibrillation threshold testing  was carried out.   After the patient was more deeply sedated with fentanyl and Versed, VF was  induced with a T wave shock. A 15 joules shock was delivered which  terminated ventricular fibrillation and restored sinus rhythm. Five minutes  was allowed to elapse and second DFT test carried out. Again, VF was induced  with T wave shock; and, again, and 15 joules shock was delivered which  terminated ventricular fibrillation and restored sinus rhythm. At this point  no additional defibrillation threshold testing was carried out; and the  incision was closed with a layer of 2-0 Vicryl, followed by a layer of 3-0  Vicryl, followed by 4-0 Vicryl.  Benzoin  was painted on the skin.  Steri-  Strips were applied, and a pressure dressing was placed; an the patient was  returned to his room in satisfactory condition.   III. COMPLICATIONS:  There were no immediate procedure complications.   IV RESULTS:  Successful implantation of Medtronic single-chamber  defibrillator in a patient with nonischemic cardiomyopathy, class 2 heart  failure, severe LV dysfunction (longstanding despite maximal medical  therapy).           ______________________________  Doylene Canning. Ladona Ridgel, M.D.     GWT/MEDQ  D:  11/27/2005  T:  11/27/2005  Job:  [id number]   cc:   Donia Guiles, M.D.  Fax: [phone]   Arvilla Meres, M.D. [facility]  [employer]  [address]  [place]  [place] [address]

## 2011-05-10 NOTE — Discharge Summary (Signed)
NAMEKHAYREE, DELELLIS NO.:  [id number]   MEDICAL RECORD NO.:  [id number]          PATIENT TYPE:  INP   LOCATION:  4707                         FACILITY:  [facility]   PHYSICIAN:  Doylene Canning. Ladona Ridgel, M.D.  DATE OF BIRTH:  May 20, 1956   DATE OF ADMISSION:  11/27/2005  DATE OF DISCHARGE:  11/28/2005                           DISCHARGE SUMMARY - REFERRING   DISCHARGE DIAGNOSES:  1.  Ischemic cardiomyopathy with known ejection fraction approximately 30%      status post Medtronic ICD placement.  2.  History as listed below.   PROCEDURES PERFORMED:  ICD placement on November 27, 2005 Medtronic MAXIMO VR  by Dr. Ladona Ridgel.   SUMMARY OF HISTORY:  Mr. Donaghey is a 55 year old male with known severe non-  ischemic cardiomyopathy and atherosclerotic peripheral vascular disease that  is followed by Dr. Samule Ohm and Dr. Madilyn Fireman.  He was evaluated by Dr. Ladona Ridgel on  November 21, 2005 for consideration of ICD implantation given his non-  ischemic cardiomyopathy class II-III heart failure with EF 30%.  Initially,  he decided not to proceed; however, patient reconsidered and presented with  Dr. Ladona Ridgel on November 21, 2005 and agrees to proceed as planned, thus his  admission.  His history is also notable for hypertension and obesity.   LABORATORY DATA:  Chest x-ray prior to admission shows cardiomegaly with  minimal bronchitic changes.  Chest x-ray post ICD shows pacer now present  with AICD lead, no pneumothorax.  Preoperative on the 30th H&H was 15 and  43.4, normal indices, platelets 312, WBC 10.2.  PTT is 34.2, PT 12.  Sodium  138, potassium 4.1, BUN 7, creatinine 1, glucose 139.  There were no  laboratories drawn during the hospitalization.  EKGs on the 6th show sinus  rhythm, normal axis, PVCs, first degree AV block.   HOSPITAL COURSE:  Mr. Antilla was admitted to 3.  On November 27, 2005 Dr.  Ladona Ridgel implanted a Medtronic [facility] VR generator without difficulty via the  left subclavian  vein.  Telemetry monitor continued to show PVCs.  November 28, 2005 post chest x-ray not revealing any pneumothorax.  It was felt that  the patient could be discharged home.  Device interrogation was performed  prior to his discharge.  His management also consisted with review and  stated that the patient will continue with his CPAP that he has at home.   DISPOSITION:  He is reminded to maintain low salt, fat, cholesterol diet.  Daily weights.  His activity and wound care will be per his instruction  sheet.  He was instructed no driving for 10 days.  May begin driving on  December 08, 2005 and may shower on December 04, 2005.   MEDICATIONS:  1.  TriCor 145 mg daily.  2.  Bisoprolol 10 mg daily.  3.  Enalapril 20 mg b.i.d.  4.  Furosemide 40 mg b.i.d.  5.  K-Dur 20 mEq daily.  6.  Aspirin 81 daily.  7.  Prevacid 30 mg p.r.n.   He will be seen in the ICD Clinic for ICD check and wound  check on December 11, 2005 at 9:45.  He will follow up with Dr. Gala Romney in the heart failure  clinic on December 30, 2005 at 2 p.m. and with Dr. Ladona Ridgel on March 11, 2006 at  12:30.   DISCHARGE TIME:  Less than 30 minutes.      Joellyn Rued, P.A. [facility]    ______________________________  Doylene Canning. Ladona Ridgel, M.D.    EW/MEDQ  D:  11/28/2005  T:  11/28/2005  Job:  [id number]   cc:   Donia Guiles, M.D.  Fax: [phone]   Arvilla Meres, M.D. [facility]  [employer]  [address]  [place]  [place] [address]   Salvadore Farber, M.D. [facility]  [address]  [address]  [place]  [place] [address]   Marcelyn Bruins, M.D. [facility]  [address]  [place]  [place] [address]   P. Liliane Bade, M.D.  [address]  [place]  [place] [address]

## 2011-05-10 NOTE — Discharge Summary (Signed)
NAMEBENN, TARVER NO.:  [id number]   MEDICAL RECORD NO.:  [id number]          PATIENT TYPE:  INP   LOCATION:  3731                         FACILITY:  [facility]   PHYSICIAN:  Rollene Rotunda, MD, FACCDATE OF BIRTH:  06-Jul-1956   DATE OF ADMISSION:  10/22/2006  DATE OF DISCHARGE:  10/23/2006                                 DISCHARGE SUMMARY   PRIMARY CARE PHYSICIAN:  Donia Guiles, M.D.   PRIMARY CARDIOLOGIST:  Dr. Rollene Rotunda.   PRINCIPAL DIAGNOSIS:  Chest pain.   SECONDARY DIAGNOSES:  1. Nonischemic cardiomyopathy with a previously documented ejection      fraction of 30%, most recently documented at 50%.  2. Nonobstructive coronary artery disease by cardiac catheterization in      July of 2005.  3. Morbid obesity.  4. History of ventricular ectopy with bigemini.  5. Status post Medtronic AICD.  6. Obstructive sleep apnea, intermittently compliant with CPAP.  7. Hyperlipidemia.  8. History of traumatic left thumb amputation.  9. Peripheral vascular disease.   ALLERGIES:  NO KNOWN DRUG ALLERGIES.   PROCEDURE:  Gallbladder ultrasound.   HISTORY OF PRESENT ILLNESS:  [age]-year-old married, white male with  prior history of nonobstructive CAD and nonischemic cardiomyopathy, now with  improved EF at 50%, who was in his usual state of health until October 22, 2006 when following dinner, he developed chest pain with radiation to the  axilla, associated with mild shortness of breath, lasting about an hour and  resolving spontaneously.  He also reported a several day history of dyspnea  on exertion, thus all of this prompting him to present to the [facility] ED  on October 31, where he ruled out for MI and ECG was without acute changes.  He was admitted for further evaluation.   HOSPITAL COURSE:  He was noted to have a mildly tender epigastrium and,  therefore, underwent gallbladder ultrasound, which was negative for any  acute findings, but did show  a fatty liver.  There was no biliary dilation.  The cardiac markers have remained negative x3 and his D-dimer was normal at  0.38.  Amylase and lipase were normal.  He has not had any recurrent chest  pain.  We have found his lipids to be markedly elevated with a total  cholesterol of 436, triglycerides of 1,252 and an HDL of 21.  His LDL was  not able to be calculated.  On discussion, we have discovered that he has  not been taking his TriCor and we have provided him with a prescription for  this.  Also, of note, he has had mildly elevated blood sugars during this  admission with a fasting sugar of 122 this morning.  Hemoglobin A1c has been  ordered and is currently pending.  Given his markedly elevated  triglycerides, obesity, elevated fasting glucose and low HDL, he appears to  be have metabolic syndrome and we have recommended close primary care  followup with possible initiation of metformin in the future.  The patient  has insured [place] that he will follow up with primary care as  soon as possible.  He is, otherwise, being discharged home today in satisfactory condition and  we will arrange for an outpatient Adenosine Myoview to rule out ischemia as  a cause of his chest pain in the coming week.   DISCHARGE LABS:  Hemoglobin 14.4, hematocrit 40.8, WBC 9.8, platelets 288,  sodium 137, potassium 4.1, chloride 105, CO2 24, BUN 10, creatinine 1.0,  glucose 122, total bilirubin 1.0, alkaline phosphatase 70, AST 36, ALT 34,  total protein 6.4, albumin 3.7, calcium 8.5, magnesium 2.3, amylase 52,  lipase 29, CK 79, MB 1.1, troponin I 0.01, total cholesterol 436,  triglycerides 1252, HDL 21, LDL not calculated.   DISPOSITION:  The patient is being discharged home today in good condition.   FOLLOWUP APPOINTMENT:  He has a followup appointment with Dr. Rollene Rotunda  on November 21 at 4 p.m.  He will also follow up with Dr. Arvilla Market in the  next week or two.  We will arrange for an outpatient  Myoview.   DISCHARGE MEDICATIONS:  1. Aspirin 81 mg daily.  2. Enalapril 20 mg b.i.d.  3. Bisoprolol 10 mg daily.  4. TriCor 145 mg daily.  5. Albuterol MDI two puffs q.4 hours p.r.n.  6. Protonix 40 mg daily.   OUTSTANDING LAB STUDIES:  A hemoglobin A1c is pending.   DURATION DISCHARGE ENCOUNTER:  Fifty-minutes, including physician time.      [name], ANP    ______________________________  Rollene Rotunda, MD, [facility]    CB/MEDQ  D:  10/23/2006  T:  10/24/2006  Job:  [id number]   cc:   Donia Guiles, M.D.

## 2011-05-10 NOTE — Cardiovascular Report (Signed)
NAME:  [name], [name] NO.:  [id number]   MEDICAL RECORD NO.:  [id number]                   PATIENT TYPE:  INP   LOCATION:  3715                                 FACILITY:  [facility]   PHYSICIAN:  Carole Binning, M.D. [facility]         DATE OF BIRTH:  1956/02/10   DATE OF PROCEDURE:  07/13/2004  DATE OF DISCHARGE:  07/14/2004                              CARDIAC CATHETERIZATION   PROCEDURE PERFORMED:  1. Left heart catheterization with coronary angiography and left     ventriculography.  2. Abdominal arteriography.   CARDIOLOGIST:  Carole Binning, M.D.   INDICATIONS:  Mr. Rathje is a 55 year old male who presented yesterday with  chest pain and ventricular ectopy.  He ruled out for myocardial infarction.  He was referred for cardiac catheterization to rule out coronary artery  disease.   DESCRIPTION OF PROCEDURE:  A 6 French sheath was placed in the left femoral  artery.  Coronary angiography was performed with the standard Judkins 6  French catheters.  Left ventriculography and abdominal aortography were  performed with an angled pigtail catheter.  Contrast was Omnipaque.  There  were no complications.   RESULTS:  HEMODYNAMICS:  Left ventricular pressure 128/22. Aortic pressure  120/76. There was no aortic valve gradient.   LEFT VENTRICULOGRAM:  There is severe global hypokinesis of the left  ventricle.  Ejection fraction calculated at 32%.  There was no significant  mitral regurgitation.   ABDOMINAL AORTOGRAM:  The abdominal aortogram reveals normal renal arteries  and abdominal aorta.  There is significant bilateral iliac ectasia with  early aneurysmal disease.   CORONARY ARTERIOGRAPHY:  (Right dominant).   Left main:  Normal.   Left anterior descending coronary artery has a tubular 40% stenosis in the  mid vessel.  The LAD gives rise to a single large diagonal branch.  The  diagonal has a 20% stenosis proximally.   The left circumflex has  luminal irregularities in the proximal to mid  vessel.  The circumflex gives rise to a single large branching obtuse  marginal.  The obtuse marginal has a diffuse 20% stenosis.   The right coronary artery has a 20% in the proximal vessel, 20% in the mid  vessel.  The distal right coronary artery gives rise to a normal size  posterior descending artery and a normal size posterior lateral branch.  There is a 40% stenosis in the distal right coronary artery at the origin of  the posterior lateral branch.   IMPRESSION:  1. Severely decreased left ventricular systolic function secondary to a     nonischemic cardiomyopathy.  2. Moderate but nonobstructed coronary artery disease as described.   PLAN:  The patient will be managed medically.  Carole Binning, M.D. [facility]    MWP/MEDQ  D:  07/13/2004  T:  07/15/2004  Job:  [id number]   cc:   Donia Guiles, M.D.  [address] [place]  [place] [address]  Fax: [phone]   Rollene Rotunda, M.D.   Cardiac Cath Lab

## 2011-05-10 NOTE — Op Note (Signed)
NAMESHASHANK, KWASNIK NO.:  [id number]   MEDICAL RECORD NO.:  [id number]          PATIENT TYPE:  OIB   LOCATION:  2854                         FACILITY:  [facility]   PHYSICIAN:  Salvadore Farber, M.D. LHCDATE OF BIRTH:  1956/03/19   DATE OF PROCEDURE:  04/23/2005  DATE OF DISCHARGE:                                 OPERATIVE REPORT   PROCEDURE PERFORMED:  Bilateral lower extremity angiography, selective left  external iliac artery catheterization with bilateral lower extremity runoff.   INDICATIONS FOR PROCEDURE:  Mr. Tomey is a 55 year old gentleman with  nonischemic cardiomyopathy, ejection fraction of 32% with superimposed  coronary disease.  He has several years of right greater than left calf  claudication with walking approximately 20 yards.  This has failed to  respond to exercise therapy.  Ankle brachial indices are 0.6 on the right  and 0.75 on the left.  He is brought for diagnostic angiography.  Plan  consideration of surgical revascularization.   PROCEDURAL TECHNIQUE:  Informed consent was obtained.  Under 1% lidocaine  local anesthesia, a 5 French sheath was placed on the right common femoral  artery using the modified Seldinger technique.  Pigtail catheter was  advanced into the suprarenal abdominal aorta.  Abdominal aortography was  performed by power injection.  Catheter was then pulled back into the  infrarenal aorta.  Abdominal aortography with bilateral lower extremity  runoff to the feet was performed by power injection using digital  subtraction and a step table technique. To obtain more detailed images of  the bifurcation of the left common femoral artery, a LIMA catheter was  advanced over a Glide wire into the left iliac artery.  Angiography was  performed by power injection.  Finally, the right bifurcation was imaged via  injection via the sheath.  Finally, the arteriotomy was closed using a 6  [place] StorClose device.  Complete hemostasis  was obtained.  The patient  tolerated the procedure well and was transferred to the holding room in  stable condition.   DISSECTION COMPLICATIONS:  1.  Abdominal aorta:  Angiographically normal without evidence of      significant plaquing or aneurysm.  2.  Renal arteries:  Single vessels bilaterally.  Both are normal.  3.  Right leg:  Rather large common iliac artery aneurysm.  The internal      iliac is patent.  There is no significant disease of the external iliac      or common femoral.  The profunda is widely patent.  The superficial      femoral artery is severely and diffusely diseased at its origin, then      occluded from its proximal segment through the level of the adductor      canal.  There, it reconstitutes via collaterals from the profunda.  The      popliteal is without significant disease.  There was three-vessel run-      off to the foot.  4.  Left leg:  Common iliac artery aneurysm.  The internal and external      iliac arteries are  patent without evidence of aneurysm or significant      stenosis.  The external iliac artery and common femoral are patent as is      the profunda.  The SFA has mild to moderate diffuse disease.  The      popliteal is normal.  The TP trunk is occluded.  The anterior tibial is      widely patent. The PT is collateralized distally.   IMPRESSION AND PLAN:  1.  Bilateral common iliac artery aneurysms.  Will obtain CT angio for      sizing.  2.  Right SFA occlusion over a long segment.  Will discuss revascularization      options.  3.  Left SFA disease with occlusion of the left TP trunk.      WED/MEDQ  D:  04/23/2005  T:  04/23/2005  Job:  [id number]   cc:   Rollene Rotunda, M.D.

## 2011-05-10 NOTE — Discharge Summary (Signed)
NAME:  [name], [name] NO.:  [id number]   MEDICAL RECORD NO.:  [id number]                   PATIENT TYPE:  INP   LOCATION:  3715                                 FACILITY:  [facility]   PHYSICIAN:  Rollene Rotunda, M.D.                DATE OF BIRTH:  Jul 15, 1956   DATE OF ADMISSION:  07/12/2004  DATE OF DISCHARGE:  07/14/2004                                 DISCHARGE SUMMARY   HISTORY ON ADMISSION:  This is a 55 year old male with no prior cardiac  history.  The patient had been doing well until the day prior to admission  when he was working outside in the hot sun, he noticed cramping in his legs  and hands.  He later had cramping in his abdomen as well as left arm pain  and nausea.  The pain lasted for approximately 30 minutes.  He also had left  sided chest pain, rated as a 10 on a 1 to 10 scale.  EMS was called.  The  patient was brought to the emergency room.  He was found to be in bigemini.  He was admitted by Dr. Antoine Poche for further evaluation.   PAST MEDICAL HISTORY:  The patient has not seen any physicians on any  regular basis.  He identified Dr. Arvilla Market as his primary care physician;  however, he has not sought regular medical followup.  He denies any  significant medical illnesses, although he recently had a dental extraction.   ALLERGIES:  No known drug allergies.   SURGICAL HISTORY:  The patient is status post traumatic amputation of his  left thumb.   MEDICATIONS PRIOR TO ADMISSION:  None.   SOCIAL HISTORY:  The patient is married.  He has two children.  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  He has smoked three packs  per day for greater than 40 years.   FAMILY HISTORY:  Noncontributory for early coronary artery disease.   HOSPITAL COURSE:  As noted, this patient was admitted to [facility]  for further evaluation of chest pain.  The decision was made to proceed with  catheterization on the day of admission.   The cardiac catheterization  revealed nonobstructive coronary artery disease, although the patient had  severe LV dysfunction with an ejection fraction of 32%.  There was severe  global hypokinesis noted.  Please see Dr. Clarita Crane dictated report for  full details.  Medical therapy was recommended.  The patient also had a  lipid profile while in the hospital.  He was found to have a cholesterol of  426, triglycerides were 568, HDL was 33, LDL was not calculated.  The  decision was made to treat the patient medically.  It was felt that patient  might need to apply for disability.  Arrangements were made to discharge  home on July 14, 2004.   DISCHARGE MEDICATIONS:  1. Lipitor 40 mg each evening.  2. Coreg 12.5 mg one half tablet twice daily.  3. Altace 10 mg each day.   LABORATORY DATA:  Please see cholesterol profile as noted above.  Basic  metabolic panel, on the day of discharge, revealed BUN 13, creatinine 0.9,  potassium 4, sodium 132.  CK was 299, MB was 1.8, index was 0.6.  Troponin  was 0.01.  TSH was 2.487.  A CBC revealed hemoglobin 16.5, hematocrit 47.1,  WBCs 9.7 thousand, platelets 254,000.  Magnesium is 2.4.  Cardiac enzymes  were negative x 3.   A chest x-ray showed no active cardiopulmonary disease.   DISCHARGE INSTRUCTIONS:  1. The patient was to be discharged on Lipitor 40 mg each evening, Coreg     6.25 mg b.i.d., Altace 10 mg daily, Tylenol p.r.n.  2. The patient was told to avoid any strenuous activity or driving for two     days.  He was not to lift more than 10 pounds for one week.  He was not     to return to work until seen back in the office.  3. The patient was to be a on a low-salt, low-cholesterol diet.  4. He was told to quit smoking.  5. He was told to call the office if he had any increased pain, swelling, or     bleeding from his groin.  6. He was to have a lipid and liver profile in approximately 6-8 weeks.  7. He was to follow up in the office  with Dr. Antoine Poche.  The office will     call him for an appointment.  8. He is to see Dr. Arvilla Market as needed or as scheduled.   PROBLEM LIST AT THE TIME OF DISCHARGE:  1. Chest pain, myocardial infarction ruled out.  2. Ventricular bigemini.  3. Cardiac catheterization performed on the day of admission showing     nonobstructive coronary disease with a low ejection fraction of 32%.  4. Hyperlipidemia with hypertriglyceridemia.  5. Long history of tobacco abuse with recommendations to quit smoking.      Delton See, P.A. [facility]                  Rollene Rotunda, M.D.    DR/MEDQ  D:  07/14/2004  T:  07/15/2004  Job:  [id number]

## 2011-05-10 NOTE — H&P (Signed)
NAMEHELMUT, [name] NO.:  [id number]   MEDICAL RECORD NO.:  [id number]          PATIENT TYPE:  EMS   LOCATION:  MAJO                         FACILITY:  [facility]   PHYSICIAN:  Bevelyn Buckles. Bensimhon, MDDATE OF BIRTH:  Aug 27, 1956   DATE OF ADMISSION:  10/22/2006  DATE OF DISCHARGE:                                HISTORY & PHYSICAL   PRIMARY CARE PHYSICIAN:  Donia Guiles, M.D.   CARDIOLOGIST:  Rollene Rotunda, M.D.   REASON FOR ADMISSION:  Chest pain.   HISTORY OF PRESENT ILLNESS:  [name] is a 55 year old male known to me  from the [facility].  He has a history of congestive heart failure  secondary to nonischemic cardiomyopathy.  EF was previously 30% but most  recently was 50%.  He is status post prophylactic ICD implantation.  He also  has a history of minimal nonobstructive coronary artery disease by cath in  July of 2005 as well as a history of noncardiac chest pain, morbid obesity,  obstructive sleep apnea, hyperlipidemia, bigeminy and peripheral vascular  disease.  He tells me tonight shortly after eating dinner he developed  severe chest pain going to his axilla.  This is associated with some  shortness of breath.  The chest pain lasted about one hour and resolved  spontaneously.  He also notes a several day history of increasing shortness  of breath with occasional orthopnea but no PND or lower extremity edema.  In  the ER, EKG showed bigeminy without any significant ST or T-wave changes.  First set of point of care markers are normal.   On review of systems, he reports occasional dark stools as well as mild  bright red blood per rectum which he attributes to hemorrhoids.  He has no  hematemesis.  There are no fevers or chills.  He does feel fatigued and has  arthritis pain.  No ICD firings.  The remainder of the review of systems is  negative except for HPI and problem list.   PROBLEM LIST:  1. History of congestive heart failure secondary  to nonischemic      cardiomyopathy.      a.     Ejection fraction previously 30%, now 50%.      b.     Status post Medtronic implantable cardioverter defibrillator.  2. Minimal nonobstructive coronary artery disease by cardiac      catheterization in July of 2005.  3. Morbid obesity.  4. History of ventricular ectopy with bigeminy.  5. Obstructive sleep apnea intermittently compliant with CPAP.  6. Hyperlipidemia.  7. History of traumatic left thumb amputation.  8. Peripheral vascular disease.   CURRENT MEDICATIONS:  1. Tricor 145 a day.  2. Enalapril 30 b.i.d.  3. Bisoprolol 10 a day.  4. Aspirin 81 a day.   ALLERGIES:  No known drug allergies.   SOCIAL HISTORY:  Lives in [place] with his wife and is unemployed.  He  has not been able to work since he developed heart failure.  He has  approximately 60-pack-year history of tobacco use.  Quit in July  of 2005.  He does not drink alcohol.   FAMILY HISTORY:  Both of his parents are living and neither one has heart  disease, although his father has had a stroke.  He has four siblings, none  of them have heart disease.   PHYSICAL EXAMINATION:  He is lying flat in bed.  No acute distress.  Respirations are normal.  Blood pressure is 146/80, heart rate 70, sats are  98% on room air.  HEENT:  Sclerae anicteric.  EOMI.  There are scattered xanthelasma.  Mucus  membranes are moist.  Oropharynx is clear.  Neck is thick.  JVP appears flat  but it is hard to assess clearly.  Carotids are 2+ bilaterally and no  bruits.  There is no lymphadenopathy or thyromegaly.  CARDIAC:  He has very distant heart sounds.  Appears mildly irregular and no  obvious murmurs, gallops, or rubs.  He is tender to palpation in his left  axilla which reproduces his pain.  LUNGS:  Mild rhonchi at the bases.  ABDOMEN:  Obese.  It is mildly tender in the epigastrium.  There is no  rebound or guarding.  He has good bowel sounds.  No obvious  hepatosplenomegaly.   No bruits.  No masses.  Negative Murphy's sign.  EXTREMITIES:  Warm.  There is no clubbing, cyanosis, or edema.  No rashes or  arthropathy.  Distal pulses are 1+ bilaterally.  NEUROLOGICAL:  He is alert and oriented x3.  Cranial nerves II through XII  are intact.  Moves all four extremities without difficulty.  Affect is  mildly flat.   EKG shows sinus rhythm with frequent PVCs in a bigeminy pattern.  Heart rate  is 79.  There is no acute ST-T wave changes.  Sodium 136, potassium 4.2, BUN  7, creatinine 1.3, glucose of 194, BNP is 164.  Point of care MB is 2.0.  Troponin is less than 0.05.  Chest x-ray shows no acute disease.   ASSESSMENT:  1. Postprandial chest pain:  I suspect noncardiac given his history of      previous noncardiac chest pain.  2. History of congestive heart failure secondary to nonischemic myopathy.      Ejection fraction now 50%.  3. Epigastric tenderness.  4. Morbid obesity.  5. Bigeminy.  6. Hyperglycemia.   PLAN/DISCUSSION:  I suspect this chest pain is noncardiac.  I wonder whether  it is GI given his postprandial nature.  We will admit for a rule out on  telemetry.  We will check LFTs, amylase, lipase, d-dimer and right upper  quadrant ultrasound as well as guaiac his stool.  If his workup is negative,  he can likely be discharged home tomorrow with possible outpatient stress  test versus continued medical management.      Bevelyn Buckles. Bensimhon, MD  Electronically Signed     DRB/MEDQ  D:  10/22/2006  T:  10/22/2006  Job:  [id number]   cc:   Donia Guiles, M.D.  Rollene Rotunda, MD, [facility]

## 2011-05-10 NOTE — Procedures (Signed)
NAME:  [name], [name] NO.:  [id number]   MEDICAL RECORD NO.:  [id number]          PATIENT TYPE:  OUT   LOCATION:  SLEEP CENTER                 FACILITY:  [facility]   PHYSICIAN:  Marcelyn Bruins, M.D. [facility] DATE OF BIRTH:  March 24, 1956   DATE OF ADMISSION:  08/02/2004  DATE OF DISCHARGE:  08/02/2004                              NOCTURNAL POLYSOMNOGRAM   REFERRING PHYSICIAN:  Dr. Angelina Sheriff   INDICATION FOR THE STUDY:  Hypersomnia with sleep apnea.   SLEEP ARCHITECTURE:  The patient had a total sleep time of 384 minutes with  decreased REM and no slow wave sleep.  Sleep latency was normal as was REM  latency.  Sleep efficiency was 77%.   IMPRESSION:  1. Moderate obstructive sleep apnea with oxygen desaturation as low as 84%.     The patient had a respiratory disturbance index of 25 events/hr.  The     events were not positional nor were they primarily rapid eye movement     related.  2. Moderate snoring noted throughout the study.  3. Frequent premature ventricular contractions were noted.                                   ______________________________                                Marcelyn Bruins, M.D. [facility]     KC/MEDQ  D:  08/15/2004 11:57:37  T:  08/15/2004 84:13:24  Job:  [id number]

## 2011-05-10 NOTE — Assessment & Plan Note (Signed)
[facility] OFFICE NOTE   NAME:[name], [name]                        MRN:          [id number]  DATE:10/30/2006                            DOB:          May 02, 1956    Mr. Ditmer is a 55 year old white male who comes to establish with the  practice due to recent admission to [facility] on October 22, 2006 to  October 23, 2006 due to chest pain and left arm pain.  He was ruled out  during that time for MI.  He was found to have mildly elevated glucose with  a hemoglobin A1C pending at the time of discharge.  He had an ultrasound of  his gallbladder which was negative, but did reveal fatty liver.   He had a pacemaker/defibrillator implanted December 2006 by Dr. Ladona Ridgel.  He  generally sees Dr. Antoine Poche on a regular basis in Cardiology.  His wife is  present for his visit.   CURRENT MEDICATIONS:  1. Enalapril 20 mg 1-1/2 b.i.d.  2. Bisoprolol 10 mg 1 daily.  3. Aspirin 81 mg 1 daily.  4. TriCor 145 mg daily.  5. Protonix 40 mg daily.  6. Albuterol HFA 2 puffs q.4 h. p.r.n.   ALLERGIES:  None known.   PAST MEDICAL HISTORY:  Is significant for congestive heart failure in 2005  with the need for a pacemaker/defibrillator implanted December 2006.  He has  100% blockage in the right leg, 50% blockage in the left leg. He had rectal  bleeding in 2006 with colonoscopy by GI.  No records currently available.  He has coronary artery disease which is non-obstructive found on cardiac  catheterization July 2005.  He has morbid obesity, obstructive sleep apnea  with equivocal compliance with his CPAP machine.  Elevated lipids.   SURGERIES:  Have included surgery to his left thumb at age 89 following a  traumatic amputation.  Ankle surgery approximately 10 years ago due to  questionable infection and pacemaker implantation as noted.   HOSPITALIZATIONS:  Have been for cardiac reasons.   SOCIAL HISTORY:  He is married  and has 2 children ages 98 and 1.  He has  not worked since 2005 and is trying to get on disability.  His wife works  currently that the [employer].  He is in no exercise program.  Stopped smoking in 2005 but has a 60 pack year history.  He does not use  alcohol or street drugs.   REVIEW OF SYSTEMS:  HEENT:  Indicates that he has dizziness at times, that  he turns the television up quite loudly, his hearing has not been evaluated.  He wears reading glasses OTC.  He was seen by an eye doctor in 2007, at that  time he had no glaucoma or cataracts.  CARDIOVASCULAR:  As per history of  present illness.  RESPIRATORY:  He is noted to have wheezing and emphysema  which was diagnosed in 2006.  He has no history of asthma.  GI:  He has a  history of rectal bleed with  colonoscopy in 2006, which revealed polyps and  hemorrhoids.  He has had no EGD.  He had blood transfusions at age 59 with  the amputation of his thumb.  GU:  He denies any renal stones.  MUSCULOSKELETAL:  He has pain in his left thumb.  He has had no history of  fractures, allergic rhinitis or thromboses.   FAMILY HISTORY:  Father is living in his 70s.  He is not a part of the  family, but Mr. Sprung knows that his father has had a stroke.  His mother  is living in her 58s with elevated cholesterol and hypertension.  He has 2  brothers living, 1 with hepatitis C, the other with no known medical  problems.  He has 2 sisters living, without medical problems.   With questions regarding the extended family, found that his maternal uncle  has had heart failure.  He has no knowledge of any diabetes or cancer in the  family.  He does know that his father's side has an extensive use of  alcohol.  Four paternal uncles have had  cancer, site is not known.  A  paternal aunt has had possibly cancer of the esophagus.   IMMUNIZATION RECORD:  His last tetanus was less than 10 years ago.  He has  not had the hepatitis C, pneumonia or  flu vaccine.   PHYSICAL EXAMINATION:  Blood pressure 105/50.  Temperature is 97.9, pulse is  35.  Weight is 291.  Height 5 feet 9-1/2 inches.  GENERAL:  Obese white male in no acute distress.  CHEST:  Is clear throughout.  No rales, rhonchi or wheezes.  He does have a  moist cough.  HEART:  Rate and rhythm regular. No murmurs, gallops or rubs are  appreciated.  He is noted to be bradycardic.  He has a pacemaker implanted  in his left upper chest wall.  The skin is clean and the area is nontender.  He has trace pretibial edema, bilaterally ankle to the mid-shin.  MUSCULOSKELETAL:  Equal muscle mass upper and lower extremities.  Uses hands  to rise from the chair.  Waist circumference is 57-3/4 inches.  SKIN:  Is without obvious lesions in the exposed area.  PSYCHIATRIC:  Oriented x3.  Verbalizes easily and quite pleasant.   ASSESSMENT:  Elevated glucose when hospitalized, certainly metabolic  syndrome.   PLAN:  1. Will get a hemoglobin A1C and notify him of the results. See him back      as needed. Treat if has become diabetic.  2. Recent hospitalization for chest pain, myocardial infarction ruled out.      He has no nonischemic coronary myopathy with an ejection fraction of      approximately 50%  recently.  Pacemaker/fibrillator placed in December      2006.  Currently under the care of Dr. Antoine Poche.      History of congestive failure, has seen Dr. Gala Romney.  Other problems      please see the problem list.      Billie D. Bean, FNP  Electronically Signed      Arta Silence, MD  Electronically Signed   BDB/MedQ  DD: 11/06/2006  DT: 11/06/2006  Job #: [id number]

## 2011-05-10 NOTE — Discharge Summary (Signed)
NAMEMICAEL, BARB NO.:  [id number]   MEDICAL RECORD NO.:  [id number]          PATIENT TYPE:  INP   LOCATION:  2003                         FACILITY:  [facility]   PHYSICIAN:  Rollene Rotunda, M.D.   DATE OF BIRTH:  May 14, 1956   DATE OF ADMISSION:  09/25/2005  DATE OF DISCHARGE:  09/26/2005                                 DISCHARGE SUMMARY   PRIMARY CARE PHYSICIAN:  Donia Guiles, M.D.   CARDIOLOGIST:  Rollene Rotunda, M.D.   DISCHARGE DIAGNOSES:  1.  Presyncope secondary to hypotension.  Negative cardiac work-up. No      evidence of myocardial infarction or arrhythmias related to hypotension.  2.  Cardiomegaly.  Last echocardiogram in May 2006.   PAST MEDICAL HISTORY:  1.  Non-ischemic cardiomyopathy, status post cardiac catheterization in July      2005 with a non-obstructive coronary artery disease with an EF of 32%.  2.  Peripheral vascular disease, status post right SFA and left SFA.  The      patient is status post recent arteriogram demonstrating superficial      femoral artery occlusion at the origin with reconstitution at the      popliteal level, greater than 50% proximal left superficial femoral      artery stenosis.  3.  Sleep apnea.  4.  Dyslipidemia.  5.  Status post traumatic amputation of the left thumb.  6.  CPAP therapy secondary to obstructive sleep apnea.  7.  History of bigeminy.   HOSPITAL COURSE:  Mr. Fuson is a 55 year old Caucasian gentleman with non-  ischemic cardiomyopathy, EF of 30%, obstructive coronary artery disease by  catheterization in July 2005 with recent episode of sudden onset of chest  pain, weakness, presyncope.  The patient went to the fire station with a  blood pressure of 80/50 per patient.  EMS gave fluid bolus and transported  the patient to [facility] Emergency Room.  The patient stated he was feeling  better in the emergency department. Blood pressure 105/78.  No active chest  pain.  EKG was normal sinus  rhythm with PVCs and no ST-T wave changes.  The  patient reports compliance with medications.  Recent adjustment in  medications.  Enalapril has been increased to 20 mg twice a day in August.  The patient complains of brief dizzy spells throughout this last month  without palpitations or syncopal episodes.  However, he continued to  complain of lower back pain after urination.  His BUN and creatinine here  are 15 and 1.5 which are up from his baseline.  BNP less than 30.   The patient was admitted for observation on telemetry.  Cardiac enzymes were  cycled.  His enalapril was decreased to 20 mg daily.  His Lasix was held.  He was gently hydrated.  Orthostatics were checked.  The patient has had no  further episodes of presyncope.  I have ambulated the patient in the hall  without problems.  EKG showing sinus rhythm with occasional PVCs.   He has been discharged home to follow up at the CHF clinic  on October 10 at  11 a.m.  He has followup appointment with Dr. Samule Ohm November 13 at 10:15  a.m.  I have instructed him to hold his Lasix and potassium until he is seen  at the heart failure clinic this Tuesday.   DISCHARGE MEDICATIONS:  1.  He should continue his aspirin 81 mg daily.  2.  Tricor 145 mg daily.  3.  Metoprolol 10 mg daily.  4.  Enalapril has been decreased to 10 mg daily.   DIET:  He is to continue his diet as previously instructed.   ACTIVITY:  As tolerated.   WOUND CARE:  Not applicable   If he has any further episodes of chest discomfort or syncopal  episodes/presyncopal episodes, he needs to seek medical attention.      Dorian Pod, NP    ______________________________  Rollene Rotunda, M.D.    MB/MEDQ  D:  09/26/2005  T:  09/26/2005  Job:  [id number]   cc:   Donia Guiles, M.D.  Fax: [phone]

## 2011-05-10 NOTE — Assessment & Plan Note (Signed)
[facility]                            CARDIOLOGY OFFICE NOTE   [name], [name]                        MRN:          [id number]  DATE:11/12/2006                            DOB:          Apr 25, 1956    PRIMARY:  Karie Schwalbe, MD   REASON FOR PRESENTATION:  Evaluate patient with cardiomyopathy.   HISTORY OF PRESENT ILLNESS:  The patient was recently discharged after  and episode of chest discomfort. It was felt to be non-anginal and he  was treated with Protonix. He says he has had improvement since then. He  has had some dyspnea that has been slowly progressive. This is with  climbing a flight of stairs or walking a moderate distance on level  ground (Class II). I do note that in the hospital his BNP was only  slightly elevated at 148. However, he has not been on diuretic because  at one point on 40 b.i.d. of Lasix, he was dehydrated. He is not  describing PND or orthopnea. He is not describing any increased edema.  He has not been having any of the chest pressure that he had before. He  has had no palpitations, pre-syncope, or syncope. He says he has been  newly diagnosed with diabetes and is currently having diet control of  that. He is trying to lose a little weight and he is down 5 pounds  according to our scales.   PAST MEDICAL HISTORY:  1. Non-ischemic cardiomyopathy (Ejection fraction had been 30% and is      up now to 50%).  2. Obstructive sleep apnea, treated with continuous positive airway      pressure.  3. Coronary artery disease (non-obstructive). Left anterior descending      artery 40% stenosis, diagonal 20% stenosis, obtuse marginal 20%      stenosis, right coronary artery 20 followed by 20% stenosis (40%      stenosis in the distal vessel).  4. Peripheral vascular disease, status post arteriogram by Dr. Samule Ohm.  5. Traumatic amputation of the left thumb.  6. Bigeminy.  7. Dyslipidemia.  8. Status post defibrillator  placement (Medtronic Maxima).   ALLERGIES:  None.   MEDICATIONS:  Tricor 145 mg daily, bisoprolol 10 mg daily, aspirin 81 mg  daily, enalapril 30 mg b.i.d., Protonix 40 mg daily, albuterol p.r.n.   REVIEW OF SYSTEMS:  As stated in HPI and otherwise negative for other  systems.   PHYSICAL EXAMINATION:  The patient is in no distress. Weight is 287  pounds. Blood pressure is 124/70, heart rate is 74 and regular.  HEENT: Eyes unremarkable. Pupils equal, round, and reactive to light.  Fundi within normal limits. Oral mucosa is unremarkable.  NECK: No jugular venous distention at 45 degrees. Carotid upstroke brisk  and symmetrical. No bruit, no thyromegaly.  LYMPHATICS: No cervical, axillary or inguinal adenopathy.  LUNGS: December breath sounds with few expiratory wheezes, but without  crackles. No dullness to percussion.  BACK: No costovertebral angle tenderness.  CHEST: Well-healed ICD pocket.  HEART: PMI not displaced or sustained. S1, S2 within normal limits. No  S3. No S4. No murmurs.  ABDOMEN: Obese, positive bowel sounds, normal in frequency, pitch. No  bruits. No rebounds. No guarding. No midline pulsatile mass. No  hepatomegaly, splenomegaly.  SKIN: No rashes, no nodules.  EXTREMITIES: 2+ pulses throughout. No edema, cyanosis or clubbing.  NEURO: Oriented to person, place and time. Cranial nerves II-XII grossly  intact. Motor grossly intact.   EKG: Sinus rhythm, ventricular ectopy in a bigeminal pattern, axis  within normal limits, borderline first-degree AV block. Intervals within  normal limits. No acute ST wave change.   ASSESSMENT/PLAN:  1. Dyspnea. The patient's predominant complaint is some dyspnea that      is not severe, but may be slightly progressive. I suspect a primary      lung problem and he is going to see Dr. [name] as a new patient. He      will continue with his albuterol and I will defer further      management of this to Dr. [name]. As there may be some  diastolic      dysfunction with some slight systolic dysfunction contributing, I      am going to restart some very low-dose diuretics with Lasix 20 mg a      day. He will start potassium 10 mEq a day and I am going to get a      BMET in two weeks.  2. Obesity. The patient understands the need to continue to lose      weight and I applaud his efforts to date.  3. Followup. I will see him back in about 6 months or sooner if      needed. Again, he is going to see Dr. [name] as a new patient. He      recently saw Eric Form for the first time in that office.     Rollene Rotunda, MD, [facility]  Electronically Signed    JH/MedQ  DD: 11/12/2006  DT: 11/12/2006  Job #: [id number]   cc:   Karie Schwalbe, MD

## 2011-05-10 NOTE — Assessment & Plan Note (Signed)
[facility]                          CHRONIC HEART FAILURE NOTE   NAME:[name], [name]                        MRN:          [id number]  DATE:04/16/2007                            DOB:          [birth year]    Primary care physician:  Karie Schwalbe, MD  Cardiologist:  Rollene Rotunda, MD   [name] is new to my heart failure clinic.  He is a 55 year old  Caucasian gentleman with congestive heart failure secondary to  nonischemic cardiomyopathy with an EF previously 30%, now up to 50% by  echocardiogram.  He recently saw Dr. Antoine Poche after a complaint of  increased fatigue and Dr. Antoine Poche found the patient to be mildly  hypotensive with blood pressure of 94/48, heart rate in the 70s.  He  decreased his enalapril to 20 mg b.i.d. and recommended that the patient  follow up in the heart failure clinic for reevaluation.  [name]  states he has been doing much better with the adjustment in his ACE  inhibitor.  Denies any extreme fatigue; however, he does have ongoing  class III symptoms of heart failure without volume overload.  He also  complains of ongoing discomfort in his lower extremities.  He has a  previous diagnosis of peripheral vascular disease, followed by Dr.  Samule Ohm.  Mr. Gracia lives in [place] with his wife.  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  He states they had to sell  that secondary to his inability to no long care for his animals  secondary to his medical problems.  He states he could only walk very  short distances before he begins to have pain in his legs and also  generalized fatigue and dyspnea.  He denies any orthopnea or PND.  No  firing from his defibrillator.   PAST MEDICAL HISTORY:  1. Congestive heart failure secondary to nonischemic cardiomyopathy,      EF of now 50% by echocardiogram.  2. Coronary artery disease, nonobstructive.  3. Obstructive sleep apnea treated with CPAP.  4.  Peripheral arterial disease status post arteriogram by Dr. Samule Ohm      with medical management at this time.  The patient is not a good      candidate for surgical revascularization.  5. Status post placement of a Medtronic Maximo model 7232 single-      chamber defibrillator implanted December 2006.  6. Dyslipidemia.  7. Traumatic amputation of left thumb.  8. History of tobacco abuse, three packs a day for greater than 40      years.  No longer smoking.   REVIEW OF SYSTEMS:  As stated above.   CURRENT MEDICATIONS:  1. Tricor 145 mg.  2. Bisoprolol 10 mg.  3. Aspirin 81 mg.  4. Lasix 20 mg daily.  5. Potassium 10 mEq daily.  6. Prilosec p.r.n.  7. Enalapril 20 mg p.o. b.i.d.  8. His p.r.n. medications include albuterol inhaler and a sleep pill.   PHYSICAL EXAMINATION:  VITAL SIGNS:  Weight 262, blood pressure 120/63,  pulse of 74.  GENERAL:  [name] is  in no acute distress.  He has a somewhat flat  affect.  NECK:  No jugular venous distention at 45-degree angle.  LUNGS:  Clear to auscultation bilaterally.  CARDIOVASCULAR:  S1 and S2, regular rate and rhythm.  ABDOMEN:  Soft, nontender, positive bowel sounds, obese.  EXTREMITIES:  Lower extremities without clubbing, cyanosis, or edema.   EKG shows a sinus rhythm with first degree block, frequent PVCs, at a  rate of 69.   IMPRESSION:  Patient with class III New York Heart Association heart  failure symptoms.  No signs of volume overload at this time.  Will have  the patient decrease his potassium to every other day, continue the  Lasix 20 mg daily.  The patient's last potassium was 5.2.  Will plan on  rechecking his lab work at next visit.  The patient might benefit from  low-dose digoxin.  Will add 0.125 mg daily for symptomatic relief of  heart failure.  He seems to be tolerating the enalapril dose.  I  initiated heart failure education teaching with the patient and his  wife, also have given him a heart failure  education packet.  They have  my phone number.  They know to call me if they have any problems.  If  not, I asked the patient to return in 1 month for reevaluation, sooner  if he has any problems.      Dorian Pod, ACNP  Electronically Signed      Rollene Rotunda, MD, [facility]  Electronically Signed   MB/MedQ  DD: 04/17/2007  DT: 04/17/2007  Job #: [id number]   cc:   Karie Schwalbe, MD

## 2011-05-10 NOTE — H&P (Signed)
NAMEDEJION, GRILLO NO.:  [id number]   MEDICAL RECORD NO.:  [id number]          PATIENT TYPE:  INP   LOCATION:  2003                         FACILITY:  [facility]   PHYSICIAN:  Theodore Demark, P.A. LHCDATE OF BIRTH:  06-12-1956   DATE OF ADMISSION:  09/25/2005  DATE OF DISCHARGE:                                HISTORY & PHYSICAL   PRIMARY CARE PHYSICIAN:  Donia Guiles, M.D. (sees rarely).   PRIMARY CARDIOLOGIST:  Rollene Rotunda, M.D.   CHIEF COMPLAINT:  Weakness/presyncope.   HISTORY OF PRESENT ILLNESS:  Mr. Yurkovich is a 55 year old male with a known  history of nonischemic cardiomyopathy.  [place] went to the restroom.  He said after he urinated, he had a sharp  chest pain.  After that, he had some fatigue.  He said he walked a couple of  blocks and then had nausea, and walked another block and had presyncope.  He  happened to be near a fire station and went in, where he was told his blood  pressure was low with a systolic approximately 80.  EMS transported him, and  he received a 500 cc fluid bolus between transport and after he was in the  emergency room.  After the 500 cc fluid bolus, he feels some better.   Mr. Dombek has frequent chest pain, which he says he does not get everyday,  but he gets most days of the week.  He has occasional orthostatic dizziness,  but has not had symptoms like these before.  He has been compliant with his  medications, with the exception of Zoloft, which he cannot afford.  He feels  he gets a good response to the Lasix.  He says that prior to the episode  today, he was in his usual state of health.   PAST MEDICAL HISTORY:  1.  Nonischemic cardiomyopathy, status post cardiac catheterization in July      of 2005 with nonobstructive coronary artery disease and an EF of 32%.  2.  Peripheral vascular disease, status post angiogram showing the right SFA      totaled and the left SFA was greater  than 50%.  3.  Dyslipidemia.  4.  History of bigeminy.  5.  Obstructive sleep apnea on CPAP.  6.  Remote history of an ankle infection.   ALLERGIES:  No known drug allergies.   CURRENT MEDICATIONS:  1.  Tricor 145 mg daily.  2.  Bisoprolol 10 mg daily.  3.  Lasix 40 mg b.i.d.  4.  Potassium 20 mEq daily.  5.  Enalapril 20 mg b.i.d.   SOCIAL HISTORY:  He lives in [place] with his wife and is unemployed  and applying for disability.  He has not been able to work since he  developed heart failure.  He has approximately a 60 pack year history of  tobacco use, and quit in July of 2005.  He does not and never has abused  alcohol or drugs.   FAMILY HISTORY:  Both of his parents are living, and neither one  has heart  disease, although his father has had a stroke.  They are both in their 39s.  He has 4 siblings, but none of them have heart disease.   REVIEW OF SYSTEMS:  Significant for chest pain, as described above.  He has  had some shortness of breath today.  He has chronic dyspnea on exertion,  orthopnea, and PND.  He has had no recent edema or palpitations.  He has  chronic claudication symptoms.  He says he has been coughing a little more  than usual lately, but denies wheezing.  He has numbness in his fingers and  feet at times.  He has arthralgias and has pain almost daily in his back,  chest, and right lower quadrant of his abdomen.  He denies gastroesophageal  reflux disease symptoms, melena, hematemesis, or hemoptysis.  Review of  systems is otherwise negative.   PHYSICAL EXAMINATION:  VITAL SIGNS:  He is afebrile.  His blood pressure  initially was 74/50 and reached 110/60 after fluid resuscitation.  His heart  rate is 80, respiratory rate 20, and O2 saturation 94% on 2 liters.  GENERAL:  He is a well-developed obese white male in no acute distress.  HEENT:  His head is normocephalic and atraumatic.  Pupils equal, round and  reactive to light and accommodation.   Extraocular movements intact.  Nares  without discharge.  NECK:  There is no lymphadenopathy, thyromegaly, bruit, or JVD noted.  CARDIOVASCULAR:  His heart is regular in rate and rhythm without significant  murmurs, rubs, or gallops.  His DP pulses are 2+, and no femoral bruits are  noted.  LUNGS:  Clear to auscultation bilaterally.  SKIN:  No rashes or lesions are noted.  ABDOMEN:  Soft and nontender with slightly decreased bowel sounds.  EXTREMITIES:  There is no clubbing, cyanosis, or edema noted.  MUSCULOSKELETAL:  There is no joint deformity or effusions, and no spine or  CVA tenderness.  NEUROLOGIC:  He is alert and oriented.  Cranial nerves II-XII grossly  intact.   CHEST X-RAY:  Minimal right base atelectasis without effusions or  infiltrate.   EKG:  Sinus rhythm, rate 74, with PVCs and no acute ischemic changes.   LABORATORY VALUES:  Magnesium is 2.2.  BNP less than 30.  INR 0.9.  PTT 36.  Hemoglobin 14.4, hematocrit 40.6, WBCs 11.6, platelets 249.  Sodium 134,  potassium 4.5, chloride 106, CO2 not measured, BUN 15, creatinine 1.5,  glucose 102.  Point of care markers - myoglobulin elevated initially at 457,  then 383, with CK-MB and troponin negative x2.   IMPRESSION:  1.  Hypertension.  Will admit and hydrate gently.  He will be started on      half normal saline at 50 cc an hour and continue on this overnight.      Will check orthostatics in the a.m.  Hold his Lasix and potassium      pending M.D. evaluation in a.m.  Will also decrease his enalapril from      20 mg b.i.d. to 20 mg a day.  2.  Chest pain.  He has chest pain almost every day, and it is unlikely that      the chest pain he had today is ischemic, but will go ahead and obtain      serial cardiac enzymes to rule out for myocardial infarction.  Further      evaluation will be decided by Dr. Antoine Poche who will see him in the a.m.  Mr. Dunlap is otherwise stable and will be continued on his home   medications.   Dr. Diona Browner saw the patient and determined the plan of care.      Theodore Demark, P.A. [facility]     RB/MEDQ  D:  09/25/2005  T:  09/25/2005  Job:  [id number]

## 2011-05-10 NOTE — Assessment & Plan Note (Signed)
[facility]                            CARDIOLOGY OFFICE NOTE   AEDEN, [name]                        MRN:          [id number]  DATE:03/24/2007                            DOB:          [birth year]    PRIMARY CARE PHYSICIAN:  Karie Schwalbe, MD.   REASON FOR PRESENTATION:  Evaluate patient with fatigue.   HISTORY OF PRESENT ILLNESS:  Patient was added onto my schedule today  because of fatigue.  He is 55 years old.  He has nonischemic  cardiomyopathy as described below.  He had been doing quite well.  However, over the last couple of weeks, he has been feeling fatigued.  He did have an episode of diarrhea and thought he might have had a viral  gastroenteritis.  This was several days ago and lasted only for a short  while.  He did recover from this.  He has been, however, fatigued.  He  said he had some orthostatic symptoms.  He has had no syncope or  presyncope.  He has had no new dyspnea.  He denies PND or orthopnea.  He  has had no chest discomfort, neck discomfort, arm discomfort, activity-  induced nausea, vomiting, or excessive diaphoresis.   Of note, he has been on a diet and actually has lost 25 pounds since  last visit.   PAST MEDICAL HISTORY:  1. Nonischemic cardiomyopathy (EF 30% previously, now up to 50% at the      last echocardiogram).  2. Obstructive sleep apnea treated with CPAP.  3. Coronary artery disease (nonobstructive, LAD 40% stenosis, diagonal      20% stenosis, obtuse marginal 20% stenosis, right coronary artery      20% stenosis, 40% stenosis in the distal vessel).  4. Peripheral vascular disease status post arteriogram by Dr. Samule Ohm,      medical management.  5. Traumatic amputation of the left thumb.  6. Bigeminy.  7. Dyslipidemia.  8. Status post Medtronic Maximo defibrillator.   ALLERGIES:  None.   CURRENT MEDICATIONS:  1. Tricor 145 mg daily.  2. Bisoprolol 10 mg daily.  3. Aspirin 81 mg daily.  4.  Enalapril 30 mg b.i.d.  5. Lasix 20 mg daily.  6. Potassium 10 mEq daily.  7. Prilosec.   REVIEW OF SYSTEMS:  As stated in the HPI and otherwise negative for  other systems.   PHYSICAL EXAMINATION:  GENERAL:  The patient is in no distress.  VITAL SIGNS:  Weight 264 pounds, body mass index 37, blood pressure  94/48, heart rate 70s.  HEENT:  Eyelids unremarkable, pupils equal, round, and reactive to  light, fundi not visualized, oral mucosa unremarkable.  NECK:  No jugulovenous distention at 45 degrees, carotid upstroke brisk  and symmetric, no bruits, or thyromegaly.  LYMPHATICS:  No cervical, axillary, or inguinal adenopathy.  LUNGS:  Decreased breath sounds but without wheezing or crackles, no  dullness to percussion.  BACK:  No costovertebral angle tenderness.  Chest wall healed ICD  pocket.  HEART:  PMI not displaced with sustained, S1and S2 within normal limits,  no S3,  no S4, no clicks, no rubs, no murmurs.  ABDOMEN:  Obese, positive bowel sounds normal in frequency and pitch, no  bruits, rebound, guarding, no midline pulse, no masses, no organomegaly.  SKIN:  No rashes, no nodules.  EXTREMITIES:  2+ upper pulses, absent dorsalis pedis and posterior  tibialis bilaterally, no cyanosis, no clubbing, no edema.  NEURO:  Oriented to person, place, and time, cranial nerves II-XII  grossly intact, motor grossly intact.   EKG:  Sinus rhythm, first-degree AV block, premature ventricular  contractions in a bigeminal pattern, no acute ST T-wave changes.  No  change from previous.   ASSESSMENT AND PLAN:  1. Fatigue.  It is unclear the etiology of the fatigue, although on      objective finding is his hypotension.  Perhaps with his weight      loss, he is not tolerating as much medication.  Therefore, I am      going to decrease his Enalapril to 20 mg b.i.d.  We may have to      back up further.  He may also need followup with a sleep doctor as      he has only had one appointment.   He says he sleeps okay, but he      may need to be reassessed for this.  Further evaluation will be      based on his response to the decrease in ACE inhibitor.  He will      continue the other medications as listed.  I will be checking a      CBC, a BMET, and TSH today as well.  2. Followup:  I would like to see the patient back in about two weeks      in the Heart Failure Clinic or sooner if he has any acute problems.     Rollene Rotunda, MD, [facility]  Electronically Signed    JH/MedQ  DD: 03/24/2007  DT: 03/24/2007  Job #: [id number]   cc:   Karie Schwalbe, MD

## 2011-05-10 NOTE — Assessment & Plan Note (Signed)
[facility]                         ELECTROPHYSIOLOGY OFFICE NOTE   NAME:HODGESJediah, Horger                        MRN:          [id number]  DATE:03/26/2007                            DOB:          [birth year]    HISTORY OF PRESENT ILLNESS:  Mr. Pelzer returns today for followup. He  is a very pleasant middle aged man with an non-ischemic cardiomyopathy  and congestive heart failure, who is status post ICD insertion. He  returns today for followup. He has a history of morbid obesity and has  recently lost approximately 35 pounds. His dyspnea has improved, though  he still gets fatigued quite easily. He has had no syncope and denies  any intercurrent ICD therapies. He denies peripheral edema.   MEDICATIONS:  Include Tricor, Bisoprolol, aspirin, Enalapril, Lasix,  potassium, Prilosec, and Enalapril.   PHYSICAL EXAMINATION:  GENERAL:  A pleasant, obese middle aged man in no  acute distress.  VITAL SIGNS:  Blood pressure 103/50, pulse 40 and regular. Respiratory  rate 18.  NECK:  Revealed 7 cm of jugular venous distention. There was  intermittent A waves noted.  LUNGS:  Clear to auscultation bilaterally. No wheezes, rales, or  rhonchi.  CARDIOVASCULAR:  Irregular rhythm with normal S1 and S2.  EXTREMITIES:  No edema.   LABORATORY DATA:  EKG demonstrates sinus rhythm with  predominance of  ventricular bigeminy.   DEFIBRILLATOR INTERROGATION:  Interrogation demonstrates a Medtronic  Maximow with R waves of 12 and an impedence of 400 OHMS and a threshold  of a volt of 0.2 milliseconds. Battery voltage is 3.17 volts.   IMPRESSION:  1. Non-ischemic cardiomyopathy  2. Congestive heart failure.  3. Non-sustained VT.  4. Status post ICD insertion.  5. Obesity.   DISCUSSION:  Overall, Mr. Tapley is stable. His heart failure remains a  class II to III but with all of this, he has tried to maintain his  activity and in fact, his activity monitor on his  defibrillator has  increased from 2 hours today to over 4 hours daily. I will plan to see  him back in 1 year for ICD followup.    Doylene Canning. Ladona Ridgel, MD  Electronically Signed   GWT/MedQ  DD: 03/26/2007  DT: 03/26/2007  Job #: [id number]

## 2011-05-10 NOTE — H&P (Signed)
NAME:  [name], [name] NO.:  [id number]   MEDICAL RECORD NO.:  [id number]                   PATIENT TYPE:  EMS   LOCATION:  [facility]                                 FACILITY:  [facility]   PHYSICIAN:  Rollene Rotunda, M.D.                DATE OF BIRTH:  1956-07-30   DATE OF ADMISSION:  07/12/2004  DATE OF DISCHARGE:                                HISTORY & PHYSICAL   PRIMARY CARE PHYSICIAN:  Donia Guiles, M.D.   REASON FOR PRESENTATION:  Evaluate the patient with chest pain.   HISTORY OF PRESENT ILLNESS:  The patient is a very pleasant 55 year old  white gentleman with no prior cardiac history.  He said that he had been  doing well until yesterday.  He has been doing a lot of work in the hot sun.  He has noticed some cramping in his hands and legs.  This morning at about  10:30 he had some cramping in his right upper abdominal quadrant.  He also  had some left arm discomfort with this with nausea.  This lasted for  approximately 15 to 20 minutes.  He was slightly lightheaded.  This evening  at 9 p.m. while at rest he developed a severe left-sided chest pain.  It was  10/10 in intensity.  He had not had pain like this before.  It was  persistent for greater than 30 minutes.  EMS was called.  He described it as  sharp.  There was no radiation to his neck or to his arms.  He did not  describe shortness of breath and did not notice any palpitations, presyncope  or syncope.  He was noted when the EMS did get there to be in bigeminy  without other acute EKG changes.  He was hemodynamically stable.  The pain  slowly resolved and he is comfortable now.   In retrospect he has not had any chest discomfort prior to this.  He does  not describe any shortness of breath.  He does not have PND or orthopnea.  He has never noticed palpitations.  He had one syncopal episode sometime ago  but has not been bothered by this in years.  He does not have any  presyncope.   PAST  MEDICAL HISTORY:  The patient does not see doctors very frequently.  He  does not know his lipid status or his blood pressure status and he has never  been diagnosed with diabetes.  Of note, the patient did have dental  extraction recently.  He took some Tylenol and Motrin for this.   PAST SURGICAL HISTORY:  Drainage of a staph abscess in his ankle in the 4th  grade, traumatic amputation of his left thumb.   ALLERGIES:  None.   MEDICATIONS:  None.   SOCIAL HISTORY:  The patient is married.  He has 2 children ages 45 and 26.  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  He has smoked 3  packs per day for greater than 40 years.   FAMILY HISTORY:  Noncontributory for early coronary artery disease.   REVIEW OF SYSTEMS:  Positive for claudication, positive for blood-streaked  stools rarely.  Negative for all other systems.   PHYSICAL EXAMINATION:  GENERAL:  The patient is in no distress.  VITAL SIGNS:  Blood pressure 150/88, heart rate 75 with frequent ectopy.  HEENT:  Eyelids unremarkable.  Pupils equal, round, reactive to light.  Fundi not visualized.  Oral mucosa unremarkable.  Poor dentition.  NECK:  No jugular venous distention, waveform within normal limits, carotid  upstroke brisk and symmetrical, no bruits, no thyromegaly.  LYMPHATICS:  No cervical, axillary or inguinal adenopathy.  LUNGS:  Clear to auscultation bilaterally.  BACK:  No costovertebral angle tenderness.  CHEST:  Unremarkable.  HEART:  PMI not displaced or sustained.  S1 & S2 within normal limits.  No  S3, no S4, no murmurs.  ABDOMEN:  Obese, positive bowel sounds normal in frequency and pitch, no  bruits, no rebound, no guarding.  No midline pulsatile mass, no  hepatomegaly, no splenomegaly.  SKIN:  No rash, no nodules.  EXTREMITIES:  2+ upper pulses, 2+ right femoral without bruits, 1+ left  femoral, absent posterior tibials bilaterally, 1+ right dorsalis pedis,  absent left dorsalis pedis.  No  cyanosis, no clubbing.  NEUROLOGICAL:  Oriented to person, place, and time.  Cranial nerves grossly  intact, motor grossly intact.   EKG sinus rhythm with ventricular ectopy in a bigeminal pattern, minimal  voltage criteria for ventricular hypertrophy, axis within normal limits,  intervals within normal limits, no acute ST wave changes.   Chest x-ray and labs pending.   ASSESSMENT AND PLAN:  1. Chest pain--the patient's discomfort is worrisome for unstable angina.     He certainly has risk factors.  At this point I will treat him with beta-     blockers, aspirin, heparin, and nitrates if needed.  He will have     elective cardiac catheterization.  We have discussed the risks and     benefits of this and he understands and agrees to proceed.  I have     discussed this at length with his wife and with the patient.  2. Peripheral vascular disease--the patient probably has peripheral vascular     disease and would benefit from an aortogram during his catheterization.  3. Ventricular ectopy--we will follow this and check his magnesium and other     electrolytes.  I will discontinued the lidocaine that was started by EMS.  4. Risk reduction--we will check a lipid profile.  5. Tobacco--he will be counseled about the need to stop smoking.                                                Rollene Rotunda, M.D.    JH/MEDQ  D:  07/13/2004  T:  07/13/2004  Job:  [id number]   cc:   Donia Guiles, M.D.  [address] [place]  [place] [address]  Fax: [phone]

## 2011-07-09 ENCOUNTER — Encounter: Payer: Self-pay | Admitting: Internal Medicine

## 2011-08-06 ENCOUNTER — Ambulatory Visit (INDEPENDENT_AMBULATORY_CARE_PROVIDER_SITE_OTHER): Payer: BC Managed Care – PPO | Admitting: Internal Medicine

## 2011-08-06 ENCOUNTER — Encounter: Payer: Self-pay | Admitting: Internal Medicine

## 2011-08-06 DIAGNOSIS — I5022 Chronic systolic (congestive) heart failure: Secondary | ICD-10-CM

## 2011-08-06 DIAGNOSIS — R0789 Other chest pain: Secondary | ICD-10-CM

## 2011-08-06 DIAGNOSIS — F17200 Nicotine dependence, unspecified, uncomplicated: Secondary | ICD-10-CM

## 2011-08-06 DIAGNOSIS — Z95810 Presence of automatic (implantable) cardiac defibrillator: Secondary | ICD-10-CM

## 2011-08-06 DIAGNOSIS — I428 Other cardiomyopathies: Secondary | ICD-10-CM

## 2011-08-06 DIAGNOSIS — R079 Chest pain, unspecified: Secondary | ICD-10-CM

## 2011-08-06 NOTE — Assessment & Plan Note (Signed)
His symptoms are class II. He is instructed to continue his current medical therapy and maintain a low-sodium diet.

## 2011-08-06 NOTE — Progress Notes (Signed)
HPI [name] returns today for followup. He is a very pleasant 55 year old man with a history of a nonischemic cardiomyopathy, chronic systolic heart failure, hypertension, status post ICD implantation. The patient has had no ICD shocks. He does note dyspnea and chest pain described as being in the left upper quadrant of the chest near the left shoulder. This is nonexertional and not related to movement of his arm. He also has an occasional cough. No weight loss. No syncope. No peripheral edema. No Known Allergies   Current Outpatient Prescriptions  Medication Sig Dispense Refill  . albuterol (PROVENTIL,VENTOLIN) 90 MCG/ACT inhaler Inhale 2 puffs into the lungs 4 (four) times daily as needed.        Marland Kitchen aspirin 81 MG tablet Take 81 mg by mouth daily.        . bisoprolol (ZEBETA) 10 MG tablet Take 10 mg by mouth daily.        . clonazePAM (KLONOPIN) 0.5 MG tablet Take 1 to 2 tablets by mouth every day as needed as directed.  60 tablet  1  . digoxin (LANOXIN) 0.125 MG tablet Take 125 mcg by mouth daily.        . enalapril (VASOTEC) 20 MG tablet Take 1 tablet (20 mg total) by mouth 2 (two) times daily.  60 tablet  6  . fenofibrate 160 MG tablet Take 160 mg by mouth daily.        . Fish Oil OIL Take 1 tablet by mouth daily.        Marland Kitchen gabapentin (NEURONTIN) 600 MG tablet 3 tabs po qd       . multivitamin (THERAGRAN) per tablet Take 1 tablet by mouth daily.        Marland Kitchen oxybutynin (DITROPAN) 5 MG tablet Take 5 mg by mouth 2 (two) times daily.        . pravastatin (PRAVACHOL) 40 MG tablet Take 40 mg by mouth at bedtime.        . vitamin B-12 (CYANOCOBALAMIN) 1000 MCG tablet Take 1,000 mcg by mouth daily.           Past Medical History  Diagnosis Date  . Diabetes mellitus     type 2  . GERD (gastroesophageal reflux disease)     with esophagitis  . PVD (peripheral vascular disease)     bilateral common iliac artery aneurysms. Right SFA occlusion over a long segment. Left SFA disease with occlusion of  the left TP trunk. Artirogram Oct. 2006  . History of colonic polyps   . Diverticulosis of colon   . Sleep apnea   . Non-ischemic cardiomyopathy   . Coronary artery disease     nonobstructive CAD  . COPD (chronic obstructive pulmonary disease)   . Tobacco abuse   . Urinary incontinence     detrussor instability    ROS:   All systems reviewed and negative except as noted in the HPI.   Past Surgical History  Procedure Date  . S/p icd placement      Medtronic Maximo [phone] single chamber  . Colonoscopy 04/2005  . Cardiac catheterization 06/2004    negative  . Pacemaker insertion 11/2005  . Copd exacerbation      Family History  Problem Relation Age of Onset  . Stroke Father   . Coronary artery disease Maternal Aunt   . Heart failure Maternal Aunt      History   Social History  . Marital Status: Married    Spouse Name: N/A  Number of Children: N/A  . Years of Education: N/A   Occupational History  . Not on file.   Social History Main Topics  . Smoking status: Current Everyday Smoker -- 1.0 packs/day    Types: Cigarettes  . Smokeless tobacco: Not on file  . Alcohol Use: No  . Drug Use:   . Sexually Active:    Other Topics Concern  . Not on file   Social History Narrative  . No narrative on file     BP 124/73  Pulse 57  Resp 12  Ht 5\' 10"  (1.778 m)  Wt 265 lb (120.203 kg)  BMI 38.02 kg/m2  Physical Exam:  diskempt appearing obese, middle-aged man NAD HEENT: Unremarkable Neck:  No JVD, no thyromegally Lymphatics:  No adenopathy Back:  No CVA tenderness Lungs:  Scattered wheezes throughout with no rhonchi. HEART:  Regular rate rhythm, no murmurs, no rubs, no clicks Abd:  Obese, soft, positive bowel sounds, no organomegally, no rebound, no guarding Ext:  2 plus pulses, no edema, no cyanosis, no clubbing Skin:  No rashes no nodules Neuro:  CN II through XII intact, motor grossly intact  DEVICE  Normal device function.  See PaceArt for  details.   Assess/Plan:

## 2011-08-06 NOTE — Assessment & Plan Note (Signed)
The etiology is unclear. It is nonexertional. He does have dyspnea. He also has a chronic cough. I will plan to proceed with PA and lateral chest x-ray.

## 2011-08-06 NOTE — Assessment & Plan Note (Signed)
I discussed the importance of smoking cessation. Unfortunately the patient likes to smoke and is not interested in stopping.

## 2011-08-06 NOTE — Assessment & Plan Note (Signed)
His device appears to be working normally. His battery is stable. Will recheck in several months. He has had no ventricular arrhythmias.

## 2011-08-06 NOTE — Patient Instructions (Signed)
Your physician recommends that you schedule a follow-up appointment in: 3 months with device clinic and 12 months with Dr Taylor  

## 2011-08-08 ENCOUNTER — Ambulatory Visit (INDEPENDENT_AMBULATORY_CARE_PROVIDER_SITE_OTHER)
Admission: RE | Admit: 2011-08-08 | Discharge: 2011-08-08 | Disposition: A | Discharge status: Home / Self Care | Payer: BC Managed Care – PPO | Source: Ambulatory Visit | Attending: Internal Medicine | Admitting: Internal Medicine

## 2011-08-08 DIAGNOSIS — Z95810 Presence of automatic (implantable) cardiac defibrillator: Secondary | ICD-10-CM

## 2011-08-08 DIAGNOSIS — I428 Other cardiomyopathies: Secondary | ICD-10-CM

## 2011-08-08 DIAGNOSIS — R079 Chest pain, unspecified: Secondary | ICD-10-CM

## 2011-08-20 ENCOUNTER — Other Ambulatory Visit: Payer: Self-pay | Admitting: *Deleted

## 2011-08-20 MED ORDER — BISOPROLOL FUMARATE 10 MG PO TABS: 10.0000 mg | ORAL_TABLET | Freq: Every day | ORAL | Status: DC

## 2011-08-27 ENCOUNTER — Ambulatory Visit (INDEPENDENT_AMBULATORY_CARE_PROVIDER_SITE_OTHER): Payer: BC Managed Care – PPO | Admitting: Cardiology

## 2011-08-27 ENCOUNTER — Encounter: Payer: Self-pay | Admitting: Cardiology

## 2011-08-27 DIAGNOSIS — R0989 Other specified symptoms and signs involving the circulatory and respiratory systems: Secondary | ICD-10-CM

## 2011-08-27 DIAGNOSIS — E119 Type 2 diabetes mellitus without complications: Secondary | ICD-10-CM

## 2011-08-27 DIAGNOSIS — I739 Peripheral vascular disease, unspecified: Secondary | ICD-10-CM

## 2011-08-27 DIAGNOSIS — R0789 Other chest pain: Secondary | ICD-10-CM

## 2011-08-27 DIAGNOSIS — I428 Other cardiomyopathies: Secondary | ICD-10-CM

## 2011-08-27 DIAGNOSIS — F17200 Nicotine dependence, unspecified, uncomplicated: Secondary | ICD-10-CM

## 2011-08-27 DIAGNOSIS — I5022 Chronic systolic (congestive) heart failure: Secondary | ICD-10-CM

## 2011-08-27 DIAGNOSIS — E7800 Pure hypercholesterolemia, unspecified: Secondary | ICD-10-CM

## 2011-08-27 DIAGNOSIS — G4730 Sleep apnea, unspecified: Secondary | ICD-10-CM

## 2011-08-27 DIAGNOSIS — E669 Obesity, unspecified: Secondary | ICD-10-CM

## 2011-08-27 LAB — TSH: TSH: 1.03 u[IU]/mL (ref 0.35–5.50)

## 2011-08-27 LAB — BRAIN NATRIURETIC PEPTIDE: Pro B Natriuretic peptide (BNP): 12 pg/mL (ref 0.0–100.0)

## 2011-08-27 NOTE — Assessment & Plan Note (Signed)
I will repeat an echo as it has been 3 years.  He does have dyspnea.  I will also get a BNP.

## 2011-08-27 NOTE — Assessment & Plan Note (Signed)
I discussed with him the need to lose weight.

## 2011-08-27 NOTE — Assessment & Plan Note (Signed)
His chest pain is atypical.  I reviewed a chest X ray done recently.  This was normal.  He needs a stress test but could not walk on a treadmill.  He will need Lexiscan Myoview.

## 2011-08-27 NOTE — Assessment & Plan Note (Signed)
He has significant fatigue and has not seen his sleep doctor in two years. I will schedule him to be seen for reevaluation of apnea.

## 2011-08-27 NOTE — Assessment & Plan Note (Signed)
I will check a fasting lipid profile when he returns.

## 2011-08-27 NOTE — Patient Instructions (Signed)
Please have blood work today.  Your physician has requested that you have a carotid duplex. This test is an ultrasound of the carotid arteries in your neck. It looks at blood flow through these arteries that supply the brain with blood. Allow one hour for this exam. There are no restrictions or special instructions.  Your physician has requested that you have a lexiscan myoview. For further information please visit [email or web address] Please follow instruction sheet, as given.  You are being referred to pulmonary to follow your sleep apnea.  Continue medications as listed.

## 2011-08-27 NOTE — Assessment & Plan Note (Signed)
He has known carotid stenosis. He is overdue for carotid follow up.  I will schedule this.

## 2011-08-27 NOTE — Progress Notes (Signed)
HPI The patient presents for follow up of cardiomyopathy.  Since I last saw him he has had chest pain.  This is sharp in goes from his left upper chest into his left arm. It has been sporadically. It lasts for several minutes. He does have finger numbness. It happens at rest and with activity. It goes away spontaneously. He does not describe associated symptoms. He has had no nausea vomiting or diaphoresis. He is not describing new palpitations, presyncope or syncope. He does have dyspnea with exertion. He has significant fatigue.  No Known Allergies  Current Outpatient Prescriptions  Medication Sig Dispense Refill  . albuterol (PROVENTIL,VENTOLIN) 90 MCG/ACT inhaler Inhale 2 puffs into the lungs 4 (four) times daily as needed.        Marland Kitchen aspirin 81 MG tablet Take 81 mg by mouth daily.        . bisoprolol (ZEBETA) 10 MG tablet Take 1 tablet (10 mg total) by mouth daily.  30 tablet  6  . clonazePAM (KLONOPIN) 0.5 MG tablet Take 1 to 2 tablets by mouth every day as needed as directed.  60 tablet  1  . digoxin (LANOXIN) 0.125 MG tablet Take 125 mcg by mouth daily.        . enalapril (VASOTEC) 20 MG tablet Take 1 tablet (20 mg total) by mouth 2 (two) times daily.  60 tablet  6  . fenofibrate 160 MG tablet Take 160 mg by mouth daily.        . Fish Oil OIL Take 1 tablet by mouth daily.        Marland Kitchen gabapentin (NEURONTIN) 600 MG tablet 3 tabs po qd       . multivitamin (THERAGRAN) per tablet Take 1 tablet by mouth daily.        . pravastatin (PRAVACHOL) 40 MG tablet Take 40 mg by mouth at bedtime.        . vitamin B-12 (CYANOCOBALAMIN) 1000 MCG tablet Take 1,000 mcg by mouth daily.          Past Medical History  Diagnosis Date  . Diabetes mellitus     type 2  . GERD (gastroesophageal reflux disease)     with esophagitis  . PVD (peripheral vascular disease)     bilateral common iliac artery aneurysms. Right SFA occlusion over a long segment. Left SFA disease with occlusion of the left TP trunk.  Artirogram Oct. 2006  . History of colonic polyps   . Diverticulosis of colon   . Sleep apnea   . Non-ischemic cardiomyopathy   . Coronary artery disease     nonobstructive CAD  . COPD (chronic obstructive pulmonary disease)   . Tobacco abuse   . Urinary incontinence     detrussor instability    Past Surgical History  Procedure Date  . S/p icd placement      Medtronic Maximo [phone] single chamber  . Colonoscopy 04/2005  . Cardiac catheterization 06/2004    negative  . Pacemaker insertion 11/2005  . Copd exacerbation     ROS:  As stated in the HPI and negative for all other systems.  PHYSICAL EXAM BP 129/79  Pulse 84  Resp 18  Ht 5\' 11"  (1.803 m)  Wt 264 lb (119.75 kg)  BMI 36.82 kg/m2 GENERAL:  Well appearing HEENT:  Pupils equal round and reactive, fundi not visualized, oral mucosa unremarkable, edentulous NECK:  No jugular venous distention, waveform within normal limits, carotid upstroke brisk and symmetric, no bruits, no thyromegaly LYMPHATICS:  No cervical, inguinal adenopathy LUNGS:  Clear to auscultation bilaterally BACK:  No CVA tenderness CHEST:  Unremarkable HEART:  PMI not displaced or sustained,S1 and S2 within normal limits, no S3, no S4, no clicks, no rubs, no murmurs ABD:  Flat, positive bowel sounds normal in frequency in pitch, no bruits, no rebound, no guarding, no midline pulsatile mass, no hepatomegaly, no splenomegaly, obese EXT:  2 plus pulses throughout, no edema, no cyanosis no clubbing SKIN:  No rashes no nodules NEURO:  Cranial nerves II through XII grossly intact, motor grossly intact throughout PSYCH:  Cognitively intact, oriented to person place and time  EKG:  Sinus rhythm, rate 84, axis within normal limits, intervals within normal limits, no acute ST-T wave changes.   ASSESSMENT AND PLAN

## 2011-08-27 NOTE — Assessment & Plan Note (Signed)
He has no desire to stop smoking.  We discussed this again today.

## 2011-08-28 ENCOUNTER — Encounter: Payer: Self-pay | Admitting: Pulmonary Disease

## 2011-08-28 ENCOUNTER — Ambulatory Visit (INDEPENDENT_AMBULATORY_CARE_PROVIDER_SITE_OTHER): Payer: BC Managed Care – PPO | Admitting: Pulmonary Disease

## 2011-08-28 VITALS — BP 126/80 | HR 89 | Temp 97.8°F | Ht 71.0 in | Wt 264.4 lb

## 2011-08-28 DIAGNOSIS — F518 Other sleep disorders not due to a substance or known physiological condition: Secondary | ICD-10-CM

## 2011-08-28 DIAGNOSIS — Z72821 Inadequate sleep hygiene: Secondary | ICD-10-CM

## 2011-08-28 DIAGNOSIS — G4733 Obstructive sleep apnea (adult) (pediatric): Secondary | ICD-10-CM | POA: Insufficient documentation

## 2011-08-28 NOTE — Assessment & Plan Note (Signed)
The pt has a h/o moderate osa, and currently is not wearing his cpap.  He has been successful with the device in the past.  He does have comorbid medical issues which can be greatly affected by his sleep apnea, and I have encouraged him to get back on treatment.  He is willing to give this a try, and will see if we can get him one of the new low profile full face masks.  Will have his DME checked his machine function, and also arrange for new supplies.  He is to call me if he has any tolerance issues.  I have also encouraged him to work aggressively on weight loss.

## 2011-08-28 NOTE — Assessment & Plan Note (Signed)
The patient has many issues that are interfering with his sleep quality.  He has a chronic pain syndrome that leads to very restless sleep, but also has a lot of sleep hygiene issues that are contributing.  He is sleeping all hours during the day, and this subsequently leads to sleep onset issues at night.  I have reviewed what constitutes good sleep hygiene, and the patient states that he will try and follow my recommendations.

## 2011-08-28 NOTE — Patient Instructions (Signed)
Will have your dme check your machine, get new supplies and mask.  Have them show you the quattro FX full face mask Work on weight loss NO sleeping during the day, do not stay in bed at night more than  If you cannot fall asleep followup with me in 2mos, but call if you are having tolerance issues with cpap

## 2011-08-28 NOTE — Progress Notes (Signed)
Subjective:    Patient ID: [name], male    DOB: 09/13/1956, 55 y.o.   MRN: [id number]  HPI The patient is a 55 year old male who had been asked to see for management of obstructive sleep apnea.  He has a history of moderate sleep apnea dating back to 2005, with a sleep study at that time showing an AHI of 25 events per hour.  He was last seen here in 2008, where he was wearing CPAP compliantly.  The patient has been lost to followup since that time.  He states that he quit wearing the CPAP approximately 6 months ago, and basically has become frustrated with it.  He typically does not like having anything on his face, but he has not kept up with supplies and mask changes.  He states that his machine was functioning properly the last time he wore it.  He currently has very restless sleep, but he believes most of this is due to a chronic pain syndrome that bothers him at night.  He does not feel rested in the mornings upon arising.  Because he has a lot of sleep disruption, he naps a lot during the day at all hours.  This then leads to sleep onset issues at night.  The patient states that he does have sleepiness issues during the day.  His Epworth score today is abnormal at 8.  His weight is neutral from his last visit here in 2008.  Sleep Questionnaire: What time do you typically go to bed?( Between what hours) 7-10pm How long does it take you to fall asleep? 2 hours How many times during the night do you wake up? 6 What time do you get out of bed to start your day? 0700 Do you drive or operate heavy machinery in your occupation? No How much has your weight changed (up or down) over the past two years? (In pounds) 40 lb (18.144 kg) Have you ever had a sleep study before? Yes If yes, location of study? [place] If yes, date of study? 2009 Do you currently use CPAP? Yes If so, what pressure? patient unsure Do you wear oxygen at any time? No     Review of Systems  Constitutional: Positive for  unexpected weight change. Negative for fever.  HENT: Positive for congestion, rhinorrhea and sneezing. Negative for ear pain, nosebleeds, sore throat, trouble swallowing, dental problem, postnasal drip and sinus pressure.        Congestion worse in the morning  Eyes: Negative for redness and itching.  Respiratory: Positive for cough and shortness of breath. Negative for chest tightness and wheezing.   Cardiovascular: Negative for palpitations and leg swelling.  Gastrointestinal: Positive for nausea. Negative for vomiting.  Genitourinary: Positive for urgency. Negative for dysuria.  Musculoskeletal: Negative for joint swelling.  Skin: Negative for rash.  Neurological: Negative for headaches.  Hematological: Bruises/bleeds easily.  Psychiatric/Behavioral: Positive for dysphoric mood. The patient is nervous/anxious.        Objective:   Physical Exam Constitutional:  Obese male, no acute distress  HENT:  Nares with septal deviation with narrowing, no purulence  Oropharynx without exudate, palate and uvula are significantly elongated.    Eyes:  Perrla, eomi, no scleral icterus  Neck:  No JVD, no TMG  Cardiovascular:  Normal rate, regular rhythm, no rubs or gallops.  No murmurs        decreased distal pulses  Pulmonary :  Normal breath sounds, no stridor or respiratory distress   No  rales, rhonchi, or wheezing  Abdominal:  Soft, nondistended, bowel sounds present.  No tenderness noted.   Musculoskeletal:  No lower extremity edema noted.  Lymph Nodes:  No cervical lymphadenopathy noted  Skin:  No cyanosis noted  Neurologic:  Appears mildly sleepy, appropriate, moves all 4 extremities without obvious deficit.         Assessment & Plan:

## 2011-08-29 ENCOUNTER — Other Ambulatory Visit: Payer: Self-pay | Admitting: *Deleted

## 2011-08-29 MED ORDER — DIGOXIN 125 MCG PO TABS: 125.0000 ug | ORAL_TABLET | Freq: Every day | ORAL | Status: DC

## 2011-09-10 ENCOUNTER — Encounter: Payer: Self-pay | Admitting: *Deleted

## 2011-09-13 LAB — COMPREHENSIVE METABOLIC PANEL
ALT: 33
Alkaline Phosphatase: 81
CO2: 25
Calcium: 9
GFR calc non Af Amer: >60
Glucose, Bld: 118 — ABNORMAL HIGH
Potassium: 4.2
Sodium: 135
Total Bilirubin: 1.4 — ABNORMAL HIGH

## 2011-09-13 LAB — DIGOXIN LEVEL: Digoxin Level: <0.2 — ABNORMAL LOW

## 2011-09-13 LAB — CARDIAC PANEL(CRET KIN+CKTOT+MB+TROPI)
CK, MB: 1.3
Total CK: 108
Troponin I: <0.01

## 2011-09-13 LAB — DIFFERENTIAL
Basophils Absolute: 0.1
Eosinophils Absolute: 0.1
Eosinophils Relative: 2
Lymphocytes Relative: 32

## 2011-09-13 LAB — TROPONIN I: Troponin I: 0.01

## 2011-09-13 LAB — POCT CARDIAC MARKERS: Operator id: 277751

## 2011-09-13 LAB — CBC
Hemoglobin: 15.1
MCHC: 35.5
RBC: 5.08

## 2011-09-16 ENCOUNTER — Telehealth: Payer: Self-pay | Admitting: Cardiology

## 2011-09-16 ENCOUNTER — Other Ambulatory Visit: Payer: Self-pay | Admitting: *Deleted

## 2011-09-16 ENCOUNTER — Encounter (INDEPENDENT_AMBULATORY_CARE_PROVIDER_SITE_OTHER): Payer: BC Managed Care – PPO | Admitting: *Deleted

## 2011-09-16 ENCOUNTER — Ambulatory Visit (HOSPITAL_COMMUNITY): Payer: BC Managed Care – PPO | Attending: Cardiology | Admitting: Radiology

## 2011-09-16 VITALS — Ht 71.0 in | Wt 258.0 lb

## 2011-09-16 DIAGNOSIS — I6529 Occlusion and stenosis of unspecified carotid artery: Secondary | ICD-10-CM

## 2011-09-16 DIAGNOSIS — I2510 Atherosclerotic heart disease of native coronary artery without angina pectoris: Secondary | ICD-10-CM

## 2011-09-16 DIAGNOSIS — E782 Mixed hyperlipidemia: Secondary | ICD-10-CM

## 2011-09-16 DIAGNOSIS — I739 Peripheral vascular disease, unspecified: Secondary | ICD-10-CM

## 2011-09-16 DIAGNOSIS — R079 Chest pain, unspecified: Secondary | ICD-10-CM

## 2011-09-16 DIAGNOSIS — R0789 Other chest pain: Secondary | ICD-10-CM | POA: Insufficient documentation

## 2011-09-16 DIAGNOSIS — E119 Type 2 diabetes mellitus without complications: Secondary | ICD-10-CM

## 2011-09-16 MED ORDER — FENOFIBRATE 160 MG PO TABS: 160.0000 mg | ORAL_TABLET | Freq: Every day | ORAL | Status: DC

## 2011-09-16 MED ORDER — TECHNETIUM TC 99M TETROFOSMIN IV KIT
33.0000 | PACK | Freq: Once | INTRAVENOUS | Status: AC | PRN
  Administered 2011-09-16: 33 via INTRAVENOUS

## 2011-09-16 MED ORDER — REGADENOSON 0.4 MG/5ML IV SOLN
0.4000 mg | Freq: Once | INTRAVENOUS | Status: AC
  Administered 2011-09-16: 0.4 mg via INTRAVENOUS

## 2011-09-16 MED ORDER — TECHNETIUM TC 99M TETROFOSMIN IV KIT
11.0000 | PACK | Freq: Once | INTRAVENOUS | Status: AC | PRN
  Administered 2011-09-16: 11 via INTRAVENOUS

## 2011-09-16 MED ORDER — PRAVASTATIN SODIUM 40 MG PO TABS: 40.0000 mg | ORAL_TABLET | Freq: Every day | ORAL | Status: DC

## 2011-09-16 NOTE — Telephone Encounter (Signed)
Walk In Pt Form " Pt Needs Written Prescription for Fenofibrate & Pravastatin" sent to [facility]  09/16/11/km

## 2011-09-16 NOTE — Progress Notes (Signed)
[facility] SITE 3 NUCLEAR MED [address] [place] [address] [phone]  Cardiology Nuclear Med Study  [name] is a 55 y.o. male [id number] 02-26-1956   Nuclear Med Background Indication for Stress Test:  Evaluation for Ischemia History:  COPD, Emphysema and '05 Cath:N/O CAD, EF=32%; '06 AICD; '07 MPS:No ischemia; '09 Echo:EF=45-50% Cardiac Risk Factors: Carotid Disease, Lipids, NIDDM, Obesity, PVD and Smoker  Symptoms:  Chest Pain with and without Exertion (last episode of chest discomfort was yesterday), Dizziness, DOE, Fatigue and Near Syncope   Nuclear Pre-Procedure Caffeine/Decaff Intake:  None NPO After: 8:00pm   Lungs:  Clear.  O2 Sat 95% on RA IV 0.9% NS with Angio Cath:  20g  IV Site: R Hand  IV Started by:  Irean Hong, RN  Chest Size (in):  46 Cup Size: n/a  Height: 5\' 11"  (1.803 m)  Weight:  258 lb (117.028 kg)  BMI:  Body mass index is 35.98 kg/(m^2). Tech Comments:  Last bisoprolol yesterday pm    Nuclear Med Study 1 or 2 day study: 1 day  Stress Test Type:  Lexiscan  Reading MD: Charlton Haws, MD  Order Authorizing Provider:  Rollene Rotunda, MD  Resting Radionuclide: Technetium 46m Tetrofosmin  Resting Radionuclide Dose: 11.0 mCi   Stress Radionuclide:  Technetium 62m Tetrofosmin  Stress Radionuclide Dose: 33.0 mCi           Stress Protocol Rest HR: 70 Stress HR: 96  Rest BP: 106/75 Stress BP: 115/65  Exercise Time (min): n/a METS: n/a   Predicted Max HR: 166 bpm % Max HR: 57.83 bpm Rate Pressure Product: 42595   Dose of Adenosine (mg):  n/a Dose of Lexiscan: 0.4 mg  Dose of Atropine (mg): n/a Dose of Dobutamine: n/a mcg/kg/min (at max HR)  Stress Test Technologist: Smiley Houseman, CMA-N  Nuclear Technologist:  Domenic Polite, CNMT     Rest Procedure:  Myocardial perfusion imaging was performed at rest 45 minutes following the intravenous administration of Technetium 13m Tetrofosmin.  Rest ECG: 1st degree AVB with  ventricular bigeminy.  Stress Procedure:  The patient received IV Lexiscan 0.4 mg over 15-seconds.  Technetium 2m Tetrofosmin injected at 30-seconds.  There were no significant changes with Lexiscan, other than frequent PVC's.  He did have a hypotensive response to [place], that was resolved in trendelenburg.  Quantitative spect images were obtained after a 45 minute delay.  Stress ECG: No significant change from baseline ECG  QPS Raw Data Images:  Normal; no motion artifact; normal heart/lung ratio. Stress Images:  There is decreased uptake in the inferior wall. Rest Images:  There is decreased uptake in the inferior wall. Subtraction (SDS):  Consistant with scar and periinfarct ischemia Transient Ischemic Dilatation (Normal <1.22):  1.06 Lung/Heart Ratio (Normal <0.45):  0.46  Quantitative Gated Spect Images QGS EDV:  n/a  QGS ESV:  n/a QGS cine images:  Study not gated QGS EF: Study not gated  Impression Exercise Capacity:  Lexiscan with no exercise. BP Response:  Normal blood pressure response. Clinical Symptoms:  nausea and dizzyness ECG Impression:  No significant ST segment change suggestive of ischemia. Comparison with Prior Nuclear Study: No images to compare  Overall Impression:  Small area of inferior wall infarct from apex to base with mild periinfarct ischemia.  No gated images due to many PVC;s    .   Charlton Haws

## 2011-09-19 ENCOUNTER — Ambulatory Visit (INDEPENDENT_AMBULATORY_CARE_PROVIDER_SITE_OTHER): Payer: BC Managed Care – PPO | Admitting: Cardiology

## 2011-09-19 ENCOUNTER — Encounter: Payer: Self-pay | Admitting: *Deleted

## 2011-09-19 ENCOUNTER — Encounter: Payer: Self-pay | Admitting: Cardiology

## 2011-09-19 DIAGNOSIS — Z01810 Encounter for preprocedural cardiovascular examination: Secondary | ICD-10-CM

## 2011-09-19 DIAGNOSIS — R9439 Abnormal result of other cardiovascular function study: Secondary | ICD-10-CM

## 2011-09-19 LAB — CBC
HCT: 44.6
MCV: 83.1
RBC: 5.38
WBC: 10.2

## 2011-09-19 LAB — URINALYSIS, ROUTINE W REFLEX MICROSCOPIC
Bilirubin Urine: NEGATIVE
Glucose, UA: NEGATIVE
Hgb urine dipstick: NEGATIVE
Ketones, ur: NEGATIVE
Nitrite: NEGATIVE
Protein, ur: NEGATIVE
Specific Gravity, Urine: 1.011
Urobilinogen, UA: 0.2
pH: 6

## 2011-09-19 LAB — POCT I-STAT, CHEM 8
BUN: 13
Calcium, Ion: 1.16
Chloride: 105
Creatinine, Ser: 1.1
Glucose, Bld: 108 — ABNORMAL HIGH
HCT: 46
Hemoglobin: 15.6
Potassium: 4.7
Sodium: 136
TCO2: 28

## 2011-09-19 LAB — DIFFERENTIAL
Basophils Absolute: 0.1
Basophils Relative: 1
Eosinophils Absolute: 0.3
Eosinophils Relative: 3
Lymphocytes Relative: 43
Lymphs Abs: 4.4 — ABNORMAL HIGH
Monocytes Absolute: 0.6
Monocytes Relative: 6
Neutro Abs: 4.8
Neutrophils Relative %: 47

## 2011-09-19 MED ORDER — ISOSORBIDE MONONITRATE 30 MG PO TB24: 30.0000 mg | ORAL_TABLET | ORAL | Status: DC

## 2011-09-19 NOTE — Patient Instructions (Signed)
Please start Isosorbide 30 mg once a day  Continue all other medications as listed  Your physician has requested that you have a cardiac catheterization. Cardiac catheterization is used to diagnose and/or treat various heart conditions. Doctors may recommend this procedure for a number of different reasons. The most common reason is to evaluate chest pain. Chest pain can be a symptom of coronary artery disease (CAD), and cardiac catheterization can show whether plaque is narrowing or blocking your heart's arteries. This procedure is also used to evaluate the valves, as well as measure the blood flow and oxygen levels in different parts of your heart. For further information please visit [email or web address] Please follow instruction sheet, as given.

## 2011-09-19 NOTE — Assessment & Plan Note (Signed)
We again discussed the need to stop smoking but he doesn't think that he can at this time and he does not want prescriptions.  I think depression and boredom are significant roadblocks.

## 2011-09-19 NOTE — Assessment & Plan Note (Signed)
The patient has chest pain and a stress perfusion study suggestive of anterior ischemia.  Given this cardiac cath is indicated.  Give his PVD right radial cath would be ideal.  I will arrange this in the outpatient J[facility] lab.  I will start Imdur.

## 2011-09-19 NOTE — Progress Notes (Signed)
HPI The patient presents for follow up of chest pain.  This is sharp in goes from his left upper chest into his left arm. It has been sporadically. It lasts for several minutes. He does have finger numbness. It happens at rest and with activity. It goes away spontaneously. He does not describe associated symptoms. He has had no nausea vomiting or diaphoresis. He is not describing new palpitations, presyncope or syncope. He does have dyspnea with exertion. He has significant fatigue.  To follow up this pain I sent him for a stress perfusion study which suggested anterior ischemia mild but from base to apex.  I reviewed his cath note from 2005.  He had 40% LAD at that time.  He has significant ongoing risk factors.    No Known Allergies  Current Outpatient Prescriptions  Medication Sig Dispense Refill  . aspirin 81 MG tablet Take 81 mg by mouth daily.        . bisoprolol (ZEBETA) 10 MG tablet Take 1 tablet (10 mg total) by mouth daily.  30 tablet  6  . clonazePAM (KLONOPIN) 0.5 MG tablet Take 1 to 2 tablets by mouth every day as needed as directed.  60 tablet  1  . digoxin (LANOXIN) 0.125 MG tablet Take 1 tablet (125 mcg total) by mouth daily.  30 tablet  3  . enalapril (VASOTEC) 20 MG tablet Take 20 mg by mouth daily.        . multivitamin (THERAGRAN) per tablet Take 1 tablet by mouth daily.        . pravastatin (PRAVACHOL) 40 MG tablet Take 1 tablet (40 mg total) by mouth at bedtime.  90 tablet  3  . vitamin B-12 (CYANOCOBALAMIN) 1000 MCG tablet Take 1,000 mcg by mouth daily.        Marland Kitchen albuterol (PROVENTIL,VENTOLIN) 90 MCG/ACT inhaler Inhale 2 puffs into the lungs 4 (four) times daily as needed.        . fenofibrate 160 MG tablet Take 1 tablet (160 mg total) by mouth daily.  90 tablet  3  . gabapentin (NEURONTIN) 600 MG tablet 3 tabs po qd         Past Medical History  Diagnosis Date  . Diabetes mellitus     type 2  . GERD (gastroesophageal reflux disease)     with esophagitis  . PVD  (peripheral vascular disease)     bilateral common iliac artery aneurysms. Right SFA occlusion over a long segment. Left SFA disease with occlusion of the left TP trunk. Artirogram Oct. 2006  . History of colonic polyps   . Diverticulosis of colon   . Sleep apnea   . Non-ischemic cardiomyopathy   . Coronary artery disease     nonobstructive CAD  . COPD (chronic obstructive pulmonary disease)   . Tobacco abuse   . Urinary incontinence     detrussor instability    Past Surgical History  Procedure Date  . S/p icd placement      Medtronic Maximo [phone] single chamber  . Colonoscopy 04/2005  . Cardiac catheterization 06/2004    negative  . Pacemaker insertion 11/2005  . Copd exacerbation     ROS:  As stated in the HPI and negative for all other systems.  PHYSICAL EXAM BP 118/76  Pulse 60  Ht 5\' 11"  (1.803 m)  Wt 261 lb (118.389 kg)  BMI 36.40 kg/m2 GENERAL:  Well appearing HEENT:  Pupils equal round and reactive, fundi not visualized, oral mucosa unremarkable, edentulous NECK:  No jugular venous distention, waveform within normal limits, carotid upstroke brisk and symmetric, no bruits, no thyromegaly LYMPHATICS:  No cervical, inguinal adenopathy LUNGS:  Clear to auscultation bilaterally BACK:  No CVA tenderness CHEST:  Unremarkable HEART:  PMI not displaced or sustained,S1 and S2 within normal limits, no S3, no S4, no clicks, no rubs, no murmurs ABD:  Flat, positive bowel sounds normal in frequency in pitch, no bruits, no rebound, no guarding, no midline pulsatile mass, no hepatomegaly, no splenomegaly, obese EXT:  2 plus pulses throughout, no edema, no cyanosis no clubbing SKIN:  No rashes no nodules NEURO:  Cranial nerves II through XII grossly intact, motor grossly intact throughout PSYCH:  Cognitively intact, oriented to person place and tim   ASSESSMENT AND PLAN

## 2011-09-19 NOTE — Assessment & Plan Note (Signed)
He seems to be euvolemic.  At this point, no change in therapy is indicated.  We have reviewed salt and fluid restrictions.  No further cardiovascular testing is indicated.  I will check the EF at the time of the cath.

## 2011-09-19 NOTE — Assessment & Plan Note (Signed)
His carotid done the other day demonstrated 60 - 79% left and 40 - 59% right stenosis.

## 2011-09-30 ENCOUNTER — Other Ambulatory Visit (INDEPENDENT_AMBULATORY_CARE_PROVIDER_SITE_OTHER): Payer: BC Managed Care – PPO | Admitting: *Deleted

## 2011-09-30 DIAGNOSIS — R9439 Abnormal result of other cardiovascular function study: Secondary | ICD-10-CM

## 2011-09-30 DIAGNOSIS — Z01810 Encounter for preprocedural cardiovascular examination: Secondary | ICD-10-CM

## 2011-09-30 LAB — CBC WITH DIFFERENTIAL/PLATELET
Basophils Relative: 0.8 % (ref 0.0–3.0)
Eosinophils Relative: 1.9 % (ref 0.0–5.0)
HCT: 44.5 % (ref 39.0–52.0)
Hemoglobin: 15.5 g/dL (ref 13.0–17.0)
Lymphocytes Relative: 43.4 % (ref 12.0–46.0)
Monocytes Relative: 6.6 % (ref 3.0–12.0)
Neutro Abs: 4.1 10*3/uL (ref 1.4–7.7)
RBC: 5.24 Mil/uL (ref 4.22–5.81)

## 2011-09-30 LAB — BASIC METABOLIC PANEL
Calcium: 8.8 mg/dL (ref 8.4–10.5)
GFR: 111.54 mL/min (ref >60.00)
Potassium: 4.4 mEq/L (ref 3.5–5.1)
Sodium: 135 mEq/L (ref 135–145)

## 2011-10-03 ENCOUNTER — Ambulatory Visit (HOSPITAL_COMMUNITY): Admission: RE | Admit: 2011-10-03 | Payer: BC Managed Care – PPO | Source: Ambulatory Visit | Admitting: Cardiology

## 2011-10-03 ENCOUNTER — Inpatient Hospital Stay (HOSPITAL_BASED_OUTPATIENT_CLINIC_OR_DEPARTMENT_OTHER)
Admission: RE | Admit: 2011-10-03 | Discharge: 2011-10-03 | Disposition: A | Discharge status: Home / Self Care | Payer: BC Managed Care – PPO | Source: Ambulatory Visit | Attending: Cardiology | Admitting: Cardiology

## 2011-10-03 DIAGNOSIS — I2510 Atherosclerotic heart disease of native coronary artery without angina pectoris: Secondary | ICD-10-CM | POA: Insufficient documentation

## 2011-10-03 DIAGNOSIS — I428 Other cardiomyopathies: Secondary | ICD-10-CM | POA: Insufficient documentation

## 2011-10-03 DIAGNOSIS — R9439 Abnormal result of other cardiovascular function study: Secondary | ICD-10-CM | POA: Insufficient documentation

## 2011-10-03 LAB — POCT I-STAT GLUCOSE
Glucose, Bld: 127 mg/dL — ABNORMAL HIGH (ref 70–99)
Operator id: 221371

## 2011-10-03 NOTE — Cardiovascular Report (Signed)
  NAMEJARRIUS, [name] NO.:  [id number]  MEDICAL RECORD NO.:  [id number]  LOCATION:  CATH                         FACILITY:  [facility]  PHYSICIAN:  Rollene Rotunda, MD, FACCDATE OF BIRTH:  August 05, 1956  DATE OF PROCEDURE:  10/03/2011 DATE OF DISCHARGE:                           CARDIAC CATHETERIZATION   PRIMARY CARE PHYSICIAN:  Karie Schwalbe, MD  CARDIOLOGIST:  Rollene Rotunda, MD, [facility]  ER PROCEDURE:  Left heart catheterization/coronary arteriography.  INDICATION:  The patient with chest pain.  He had a previous known nonobstructive coronary disease.  He has a dilated cardiomyopathy.  He did have a stress perfusion study demonstrating some inferior infarct from apex to base with mild peri-infarct ischemia.  PROCEDURAL NOTE:  Left heart catheterization performed via the right radial artery, and the artery was cannulated using anterior wall puncture.  A 4-French arterial sheath was inserted via the modified Seldinger technique.  Preformed Judkins and pigtail catheter were utilized.  The patient tolerated the procedure well. Left the lab in stable condition.  RESULTS:  Hemodynamics: LV 144/19, AO 138/97.  Coronary arteries:  Left main had luminal irregularities.  There was some calcification.  The LAD had proximal 25% followed by 25% stenosis. There was long proximal 60-70% stenosis.  The remainder of the vessel was large caliber and free of high-grade disease.  There is a very small 1st diagonal.  This was free of high-grade disease.  The 2nd diagonal was moderate sized vessel.  There was long proximal 25% stenosis and distal 50% stenosis.  Circumflex in the AV groove had a 75-80% long stenosis.  This led into a large mid obtuse marginal, which was a branching vessel.  The proximal portion of the vessel before the branch was free of high-grade disease.  The superior branch had subtotal long stenosis and appeared to be diffusely diseased.  The inferior branch  was somewhat narrow in caliber and free of high-grade disease.  The right coronary artery was a large dominant vessel.  There was long proximal 25% stenosis.  There is mid 30% stenosis.  There was distal 25% stenosis.  PDA was moderate size and normal.  Posterolateral was moderate size and normal.  Left ventriculogram:  Left ventriculogram was obtained in the RAO projection.  The EF was 35-40% with global hypokinesis.  CONCLUSION: 1. Three-vessel coronary artery disease. 2. Nonischemic cardiomyopathy.  PLAN:  The patient will continue to have medical management.  No further cardiovascular testing is suggested.  If he continues to have any symptoms, need to consider revascularization with bypass surgery though this would be somewhat high risk given the reduced ejection fraction, and other comorbidities.     Rollene Rotunda, MD, [facility]     JH/MEDQ  D:  10/03/2011  T:  10/03/2011  Job:  [id number]  Electronically Signed by Rollene Rotunda MD [facility] on 10/03/2011 01:27:46 PM

## 2011-10-04 ENCOUNTER — Telehealth: Payer: Self-pay | Admitting: Cardiology

## 2011-10-04 DIAGNOSIS — I2510 Atherosclerotic heart disease of native coronary artery without angina pectoris: Secondary | ICD-10-CM

## 2011-10-04 MED ORDER — CLOPIDOGREL BISULFATE 75 MG PO TABS: 75.0000 mg | ORAL_TABLET | Freq: Every day | ORAL | Status: AC

## 2011-10-04 NOTE — Telephone Encounter (Signed)
Per wife - Dr Antoine Poche mentioned starting pt on Plavix however they did not receive an RX.  Should pt start Plavix 75 mg a day.  [facility].

## 2011-10-04 NOTE — Telephone Encounter (Signed)
Pt wife calling wanting to see if pt should start taking plavix again. Please return pt wife call to discuss further.

## 2011-10-04 NOTE — Telephone Encounter (Signed)
Per Dr Antoine Poche - pt should be on Plavix. rx sent into pharmacy.

## 2011-10-17 ENCOUNTER — Ambulatory Visit (INDEPENDENT_AMBULATORY_CARE_PROVIDER_SITE_OTHER): Payer: BC Managed Care – PPO | Admitting: Cardiology

## 2011-10-17 ENCOUNTER — Encounter: Payer: Self-pay | Admitting: Cardiology

## 2011-10-17 DIAGNOSIS — E669 Obesity, unspecified: Secondary | ICD-10-CM

## 2011-10-17 DIAGNOSIS — F17200 Nicotine dependence, unspecified, uncomplicated: Secondary | ICD-10-CM

## 2011-10-17 DIAGNOSIS — I25119 Atherosclerotic heart disease of native coronary artery with unspecified angina pectoris: Secondary | ICD-10-CM | POA: Insufficient documentation

## 2011-10-17 DIAGNOSIS — E785 Hyperlipidemia, unspecified: Secondary | ICD-10-CM

## 2011-10-17 DIAGNOSIS — I428 Other cardiomyopathies: Secondary | ICD-10-CM

## 2011-10-17 DIAGNOSIS — I2510 Atherosclerotic heart disease of native coronary artery without angina pectoris: Secondary | ICD-10-CM

## 2011-10-17 NOTE — Assessment & Plan Note (Signed)
He seems to be euvolemic.  At this point, no change in therapy is indicated.  We have reviewed salt and fluid restrictions.  No further cardiovascular testing is indicated.   

## 2011-10-17 NOTE — Patient Instructions (Signed)
The current medical regimen is effective;  continue present plan and medications.  Follow up in 6 months with Dr Hochrein.  You will receive a letter in the mail 2 months before you are due.  Please call us when you receive this letter to schedule your follow up appointment.  

## 2011-10-17 NOTE — Assessment & Plan Note (Signed)
We discussed a specific strategy for tobacco cessation.  (Greater than three minutes discussing tobacco cessation.)  He is going to use the patches and contact his wife's insurance which offers a cessation program.

## 2011-10-17 NOTE — Assessment & Plan Note (Signed)
The patient has no new sypmtoms.  No further cardiovascular testing is indicated.  We will continue with aggressive risk reduction and meds as listed.  

## 2011-10-17 NOTE — Assessment & Plan Note (Signed)
I will have him come back for a fasting lipid profile.

## 2011-10-17 NOTE — Assessment & Plan Note (Signed)
The patient understands the need to lose weight with diet and exercise. We have discussed specific strategies for this.  

## 2011-10-17 NOTE — Progress Notes (Signed)
HPI The patient presents for follow up of chest pain.  He had a stress test indicating  anterior ischemia mild but from base to apex.  I sent him for a cath demonstrating LAD a 60-70% stenosis, moderate size second diagonal with 50% stenosis, circumflex AV groove 75-80% stenosis with a branching obtuse marginal with a superior branch subtotal stenosis followed by diffuse disease, right coronary artery  had nonobstructive disease.  EF was 35%.  Since I last saw him he has done well.  The patient denies any new symptoms such as chest discomfort, neck or arm discomfort. There has been no new shortness of breath, PND or orthopnea. There have been no reported palpitations, presyncope or syncope.  He is trying to quit tobacco.   No Known Allergies  Current Outpatient Prescriptions  Medication Sig Dispense Refill  . albuterol (PROVENTIL,VENTOLIN) 90 MCG/ACT inhaler Inhale 2 puffs into the lungs 4 (four) times daily as needed.        Marland Kitchen aspirin 81 MG tablet Take 81 mg by mouth daily.        . bisoprolol (ZEBETA) 10 MG tablet Take 1 tablet (10 mg total) by mouth daily.  30 tablet  6  . clonazePAM (KLONOPIN) 0.5 MG tablet Take 1 to 2 tablets by mouth every day as needed as directed.  60 tablet  1  . clopidogrel (PLAVIX) 75 MG tablet Take 1 tablet (75 mg total) by mouth daily.  30 tablet  11  . digoxin (LANOXIN) 0.125 MG tablet Take 1 tablet (125 mcg total) by mouth daily.  30 tablet  3  . fenofibrate 160 MG tablet Take 1 tablet (160 mg total) by mouth daily.  90 tablet  3  . gabapentin (NEURONTIN) 600 MG tablet 3 tabs po qd       . isosorbide mononitrate (IMDUR) 30 MG CR tablet Take 1 tablet (30 mg total) by mouth every morning.  30 tablet  11  . multivitamin (THERAGRAN) per tablet Take 1 tablet by mouth daily.        . pravastatin (PRAVACHOL) 40 MG tablet Take 1 tablet (40 mg total) by mouth at bedtime.  90 tablet  3  . vitamin B-12 (CYANOCOBALAMIN) 1000 MCG tablet Take 1,000 mcg by mouth daily.           Past Medical History  Diagnosis Date  . Diabetes mellitus     type 2  . GERD (gastroesophageal reflux disease)     with esophagitis  . PVD (peripheral vascular disease)     bilateral common iliac artery aneurysms. Right SFA occlusion over a long segment. Left SFA disease with occlusion of the left TP trunk. Artirogram Oct. 2006  . History of colonic polyps   . Diverticulosis of colon   . Sleep apnea   . Non-ischemic cardiomyopathy   . Coronary artery disease     nonobstructive CAD  . COPD (chronic obstructive pulmonary disease)   . Tobacco abuse   . Urinary incontinence     detrussor instability    Past Surgical History  Procedure Date  . S/p icd placement      Medtronic Maximo [phone] single chamber  . Colonoscopy 04/2005  . Cardiac catheterization 06/2004    negative  . Pacemaker insertion 11/2005  . Copd exacerbation     ROS:  As stated in the HPI and negative for all other systems.  PHYSICAL EXAM BP 114/69  Pulse 60  Ht 5\' 11"  (1.803 m)  Wt 255 lb (115.667 kg)  BMI 35.57 kg/m2 GENERAL:  Well appearing NECK:  No jugular venous distention, waveform within normal limits, carotid upstroke brisk and symmetric, no bruits, no thyromegaly LUNGS:  Clear to auscultation bilaterally BACK:  No CVA tenderness CHEST:  Unremarkable HEART:  PMI not displaced or sustained,S1 and S2 within normal limits, no S3, no S4, no clicks, no rubs, no murmurs ABD:  Flat, positive bowel sounds normal in frequency in pitch, no bruits, no rebound, no guarding, no midline pulsatile mass, no hepatomegaly, no splenomegaly, obese EXT:  2 plus pulses throughout, no edema, no cyanosis no clubbing, right wrist OK NEURO:  Cranial nerves II through XII grossly intact, motor grossly intact throughout PSYCH:  Cognitively intact, oriented to person place and tim   ASSESSMENT AND PLAN

## 2011-10-25 ENCOUNTER — Ambulatory Visit: Payer: BC Managed Care – PPO | Admitting: Cardiology

## 2011-10-30 ENCOUNTER — Encounter: Payer: Self-pay | Admitting: Pulmonary Disease

## 2011-10-30 ENCOUNTER — Ambulatory Visit (INDEPENDENT_AMBULATORY_CARE_PROVIDER_SITE_OTHER): Payer: BC Managed Care – PPO | Admitting: Pulmonary Disease

## 2011-10-30 VITALS — BP 126/70 | HR 58 | Temp 98.0°F | Ht 71.0 in | Wt 261.8 lb

## 2011-10-30 DIAGNOSIS — G4733 Obstructive sleep apnea (adult) (pediatric): Secondary | ICD-10-CM

## 2011-10-30 NOTE — Patient Instructions (Signed)
Will try nasal pillows with chin strap instead of full face mask Continue to work on weight loss followup with me in 6mos, but call us in a few weeks to let us know how things are going with the new mask

## 2011-10-30 NOTE — Progress Notes (Signed)
  Subjective:    Patient ID: [name], male    DOB: 1956/11/09, 55 y.o.   MRN: [id number]  HPI The patient comes in today for followup of his obstructive sleep apnea.  He is currently on CPAP, but is having difficulty with tolerance due to mask issues.  It is not a matter of fact for leaks, but rather an intolerance to "having something on my face".  He will typically wears CPAP for a few hours at a time, will awaken during the night with the mask on the floor.  He has not tried nasal pillows.   Review of Systems  Constitutional: Negative for fever and unexpected weight change.  HENT: Positive for sneezing. Negative for ear pain, nosebleeds, congestion, sore throat, rhinorrhea, trouble swallowing, dental problem, postnasal drip and sinus pressure.   Eyes: Negative for redness and itching.  Respiratory: Positive for cough and shortness of breath. Negative for chest tightness and wheezing.   Cardiovascular: Negative for palpitations and leg swelling.  Gastrointestinal: Negative for nausea and vomiting.  Genitourinary: Negative for dysuria.  Musculoskeletal: Negative for joint swelling.  Skin: Negative for rash.  Neurological: Positive for headaches.  Hematological: Bruises/bleeds easily.  Psychiatric/Behavioral: Negative for dysphoric mood. The patient is not nervous/anxious.        Objective:   Physical Exam Obese male in no acute distress No skin breakdown or pressure necrosis from the CPAP mask Lower extremities with mild edema, no cyanosis noted Awake, but appears mildly sleepy, moves all 4 extremities.       Assessment & Plan:

## 2011-10-30 NOTE — Assessment & Plan Note (Signed)
The patient has continued to try CPAP, but is having difficulty due to mask intolerance.  It is not a matter of the time of mask or fit, but just the fact that something is "on my face".  I wonder if he would do better with nasal pillows, and add a chin strap for mouth opening.  He is willing to try this.  If he continues to have difficulty, I suspect this is not a viable therapy for him, and would start on nocturnal oxygen in order to maintain saturations during sleep.  I have also encouraged him to work aggressively on weight loss.

## 2011-11-06 ENCOUNTER — Encounter: Payer: BC Managed Care – PPO | Admitting: *Deleted

## 2011-12-09 ENCOUNTER — Other Ambulatory Visit: Payer: Self-pay

## 2011-12-25 ENCOUNTER — Encounter: Payer: BC Managed Care – PPO | Admitting: *Deleted

## 2012-01-07 ENCOUNTER — Telehealth: Payer: Self-pay | Admitting: *Deleted

## 2012-01-07 NOTE — Telephone Encounter (Signed)
Pt needs refill on enalapril 20mg . Rx is for 1 po daily. But the patient has a RX on file at the pharmacy for 20mg  1 po bid. Please check with Dr. Antoine Poche to find out what he wants the patient to be on.

## 2012-01-13 ENCOUNTER — Other Ambulatory Visit: Payer: Self-pay

## 2012-01-13 MED ORDER — DIGOXIN 125 MCG PO TABS: 125.0000 ug | ORAL_TABLET | Freq: Every day | ORAL | Status: DC

## 2012-01-13 NOTE — Telephone Encounter (Signed)
08/27/11 note states bid, 09/19/11 states daily and then d/ced.  Is pt supposed to be on Enalapril at all?

## 2012-01-13 NOTE — Telephone Encounter (Signed)
..   Requested Prescriptions   Signed Prescriptions Disp Refills  . digoxin (LANOXIN) 0.125 MG tablet 30 tablet 3    Sig: Take 1 tablet (125 mcg total) by mouth daily.    Authorizing Provider: Rollene Rotunda    Ordering User: Christella Hartigan, Edwin Cherian Judie Petit

## 2012-01-14 ENCOUNTER — Other Ambulatory Visit: Payer: Self-pay

## 2012-01-14 MED ORDER — ENALAPRIL MALEATE 20 MG PO TABS: 20.0000 mg | ORAL_TABLET | Freq: Every day | ORAL | Status: DC

## 2012-01-14 NOTE — Telephone Encounter (Signed)
He should be taking 20 mg once daily.

## 2012-01-16 ENCOUNTER — Telehealth: Payer: Self-pay | Admitting: Cardiology

## 2012-01-16 MED ORDER — ENALAPRIL MALEATE 20 MG PO TABS: 20.0000 mg | ORAL_TABLET | Freq: Every day | ORAL | Status: DC

## 2012-01-16 NOTE — Telephone Encounter (Signed)
Refill was sent on 1/22 but will be re-sent today.  Left message for her to call back if they don't receive it.

## 2012-01-16 NOTE — Telephone Encounter (Signed)
[name] never got refill request on enalapril and pt has not had medication since December

## 2012-03-24 ENCOUNTER — Other Ambulatory Visit: Payer: Self-pay

## 2012-03-24 DIAGNOSIS — E782 Mixed hyperlipidemia: Secondary | ICD-10-CM

## 2012-03-24 MED ORDER — PRAVASTATIN SODIUM 40 MG PO TABS: 40.0000 mg | ORAL_TABLET | Freq: Every day | ORAL | Status: DC

## 2012-03-24 NOTE — Telephone Encounter (Signed)
Called pharm to refilled pravastatin 40 mg 90 tabs one refill

## 2012-03-25 ENCOUNTER — Telehealth: Payer: Self-pay

## 2012-03-25 ENCOUNTER — Ambulatory Visit (INDEPENDENT_AMBULATORY_CARE_PROVIDER_SITE_OTHER): Payer: BC Managed Care – PPO | Admitting: Internal Medicine

## 2012-03-25 ENCOUNTER — Encounter: Payer: Self-pay | Admitting: Internal Medicine

## 2012-03-25 VITALS — BP 128/70 | HR 44 | Temp 98.2°F | Ht 70.0 in | Wt 259.0 lb

## 2012-03-25 DIAGNOSIS — E119 Type 2 diabetes mellitus without complications: Secondary | ICD-10-CM | POA: Diagnosis not present

## 2012-03-25 DIAGNOSIS — I739 Peripheral vascular disease, unspecified: Secondary | ICD-10-CM | POA: Diagnosis not present

## 2012-03-25 DIAGNOSIS — G540 Brachial plexus disorders: Secondary | ICD-10-CM

## 2012-03-25 DIAGNOSIS — I5022 Chronic systolic (congestive) heart failure: Secondary | ICD-10-CM

## 2012-03-25 DIAGNOSIS — J449 Chronic obstructive pulmonary disease, unspecified: Secondary | ICD-10-CM

## 2012-03-25 LAB — HEPATIC FUNCTION PANEL
Albumin: 4.3 g/dL (ref 3.5–5.2)
Alkaline Phosphatase: 74 U/L (ref 39–117)
Total Protein: 7.5 g/dL (ref 6.0–8.3)

## 2012-03-25 LAB — BASIC METABOLIC PANEL
BUN: 11 mg/dL (ref 6–23)
CO2: 26 mEq/L (ref 19–32)
Calcium: 9.6 mg/dL (ref 8.4–10.5)
Creatinine, Ser: 0.8 mg/dL (ref 0.4–1.5)
Glucose, Bld: 126 mg/dL — ABNORMAL HIGH (ref 70–99)
Sodium: 139 mEq/L (ref 135–145)

## 2012-03-25 LAB — HEMOGLOBIN A1C: Hgb A1c MFr Bld: 6 % (ref 4.6–6.5)

## 2012-03-25 LAB — LIPID PANEL
Cholesterol: 334 mg/dL — ABNORMAL HIGH (ref 0–200)
Triglycerides: 1018 mg/dL — ABNORMAL HIGH (ref 0.0–149.0)

## 2012-03-25 LAB — LDL CHOLESTEROL, DIRECT: Direct LDL: 78.3 mg/dL

## 2012-03-25 NOTE — Progress Notes (Signed)
Subjective:    Patient ID: [name], male    DOB: July 24, 1956, 56 y.o.   MRN: [id number]  HPI Doing okay  Has had some bad left arm pain Goes back 6-7 months Fairly constant Starts under clavicle then radiates all the way down to his fingers Has tingling then pain down arm Not particularly bad in bed---doesn't notice it then. Does hold arm under pillow  Now has noticed some "jerks" Gets sudden contraction of left arm and shoulder  Doesn't check sugars Does avoid concentrated sweets Not really exercising---will piddle with his cars Gets severe leg pain quickly so his activity is limited (PVD)  No chest pain No SOB but does get DOE  Current Outpatient Prescriptions on File Prior to Visit  Medication Sig Dispense Refill  . albuterol (PROVENTIL,VENTOLIN) 90 MCG/ACT inhaler Inhale 2 puffs into the lungs 4 (four) times daily as needed.        Marland Kitchen aspirin 81 MG tablet Take 81 mg by mouth daily.        . bisoprolol (ZEBETA) 10 MG tablet Take 1 tablet (10 mg total) by mouth daily.  30 tablet  6  . clopidogrel (PLAVIX) 75 MG tablet Take 1 tablet (75 mg total) by mouth daily.  30 tablet  11  . digoxin (LANOXIN) 0.125 MG tablet Take 1 tablet (125 mcg total) by mouth daily.  30 tablet  3  . enalapril (VASOTEC) 20 MG tablet Take 1 tablet (20 mg total) by mouth daily.  30 tablet  11  . isosorbide mononitrate (IMDUR) 30 MG CR tablet Take 1 tablet (30 mg total) by mouth every morning.  30 tablet  11  . multivitamin (THERAGRAN) per tablet Take 1 tablet by mouth daily.        . pravastatin (PRAVACHOL) 40 MG tablet Take 1 tablet (40 mg total) by mouth at bedtime.  90 tablet  1  . vitamin B-12 (CYANOCOBALAMIN) 1000 MCG tablet Take 1,000 mcg by mouth daily.          No Known Allergies  Past Medical History  Diagnosis Date  . Diabetes mellitus     type 2  . GERD (gastroesophageal reflux disease)     with esophagitis  . PVD (peripheral vascular disease)     bilateral common iliac artery  aneurysms. Right SFA occlusion over a long segment. Left SFA disease with occlusion of the left TP trunk. Artirogram Oct. 2006  . History of colonic polyps   . Diverticulosis of colon   . Sleep apnea   . Non-ischemic cardiomyopathy   . Coronary artery disease      LAD a 60-70% stenosis, moderate size second diagonal with 50% stenosis, circumflex AV groove 75-80% stenosis with a branching obtuse marginal with a superior branch subtotal stenosis followed by diffuse disease, right coronary artery  had nonobstructive disease.  EF was 35%  . COPD (chronic obstructive pulmonary disease)   . Tobacco abuse   . Urinary incontinence     detrussor instability    Past Surgical History  Procedure Date  . S/p icd placement      Medtronic Maximo [phone] single chamber  . Colonoscopy 04/2005  . Cardiac catheterization 06/2004    negative  . Pacemaker insertion 11/2005  . Copd exacerbation     Family History  Problem Relation Age of Onset  . Stroke Father   . Coronary artery disease Maternal Aunt   . Heart failure Maternal Aunt   . Lung cancer Maternal Aunt   .  Lung cancer Maternal Uncle   . Cancer Brother     Mouth  . Cancer Sister     throat    History   Social History  . Marital Status: Married    Spouse Name: N/A    Number of Children: N/A  . Years of Education: N/A   Occupational History  . Not on file.   Social History Main Topics  . Smoking status: Current Everyday Smoker -- 1.0 packs/day for 40 years    Types: Cigarettes  . Smokeless tobacco: Not on file  . Alcohol Use: No  . Drug Use: Not on file  . Sexually Active: Not on file   Other Topics Concern  . Not on file   Social History Narrative  . No narrative on file   Review of Systems Weight is stable or down slightly Sleeps fair--not using machine--can't tolerate the mask     Objective:   Physical Exam  Constitutional: He appears well-developed and well-nourished. No distress.  Neck: Normal range of  motion. Neck supple.  Cardiovascular: Normal rate, regular rhythm and normal heart sounds.  Exam reveals no gallop.   No murmur heard.      Pulses absent in feet  Pulmonary/Chest: Effort normal. No respiratory distress. He has no wheezes. He has no rales.       Decreased breath sounds Slight rhonchi  Musculoskeletal: He exhibits no edema and no tenderness.  Lymphadenopathy:    He has no cervical adenopathy.  Skin: No rash noted.       No foot ulcers  Psychiatric:       Melancholy about disability          Assessment & Plan:

## 2012-03-25 NOTE — Telephone Encounter (Signed)
rx would not escribe over to pham

## 2012-03-25 NOTE — Assessment & Plan Note (Signed)
Fairly stable DOE (like when having to dig a grave for his dog recently) Neutral fluid status

## 2012-03-25 NOTE — Assessment & Plan Note (Signed)
Seems to have nerve issue in arm Normal strength and ROM of shoulder Discussed positioning at night

## 2012-03-25 NOTE — Assessment & Plan Note (Signed)
The main disability he has now Discussed the cigarette cessation again---it is one of his only enjoyments in life though

## 2012-03-25 NOTE — Assessment & Plan Note (Signed)
No sig symptoms  Discussed cig cessation

## 2012-03-25 NOTE — Assessment & Plan Note (Signed)
Hopefully still good control Did do diabetic education Will check A1c

## 2012-04-01 ENCOUNTER — Encounter: Payer: Self-pay | Admitting: *Deleted

## 2012-04-06 ENCOUNTER — Encounter: Payer: Self-pay | Admitting: *Deleted

## 2012-04-06 ENCOUNTER — Other Ambulatory Visit: Payer: Self-pay | Admitting: *Deleted

## 2012-04-06 MED ORDER — GEMFIBROZIL 600 MG PO TABS: 600.0000 mg | ORAL_TABLET | Freq: Two times a day (BID) | ORAL | Status: DC

## 2012-04-06 NOTE — Telephone Encounter (Signed)
rx sent to pharmacy by e-script Spoke with patient and advised results   

## 2012-04-06 NOTE — Telephone Encounter (Signed)
Message copied by Sueanne Margarita on Mon Apr 06, 2012  9:16 AM ------      Message from: Tillman Abide I      Created: Thu Apr 02, 2012  2:12 PM       Please try gemfibrozil 600mg  bid. Similar to fenofibrate but generic and should be cheap      If he is willing, send Rx for 1 year      If he starts, set up lipid and hepatic in about 6 weeks (hypertriglyceridemia)

## 2012-04-16 ENCOUNTER — Encounter: Payer: Self-pay | Admitting: Cardiology

## 2012-04-16 ENCOUNTER — Encounter: Payer: Self-pay | Admitting: Internal Medicine

## 2012-04-16 ENCOUNTER — Ambulatory Visit (INDEPENDENT_AMBULATORY_CARE_PROVIDER_SITE_OTHER): Payer: BC Managed Care – PPO | Admitting: Cardiology

## 2012-04-16 ENCOUNTER — Ambulatory Visit (INDEPENDENT_AMBULATORY_CARE_PROVIDER_SITE_OTHER): Payer: BC Managed Care – PPO | Admitting: *Deleted

## 2012-04-16 VITALS — BP 108/58 | HR 60 | Ht 70.0 in | Wt 253.0 lb

## 2012-04-16 DIAGNOSIS — I428 Other cardiomyopathies: Secondary | ICD-10-CM

## 2012-04-16 DIAGNOSIS — I5022 Chronic systolic (congestive) heart failure: Secondary | ICD-10-CM | POA: Diagnosis not present

## 2012-04-16 DIAGNOSIS — Z95810 Presence of automatic (implantable) cardiac defibrillator: Secondary | ICD-10-CM

## 2012-04-16 DIAGNOSIS — F17200 Nicotine dependence, unspecified, uncomplicated: Secondary | ICD-10-CM

## 2012-04-16 DIAGNOSIS — R0989 Other specified symptoms and signs involving the circulatory and respiratory systems: Secondary | ICD-10-CM | POA: Diagnosis not present

## 2012-04-16 DIAGNOSIS — I2510 Atherosclerotic heart disease of native coronary artery without angina pectoris: Secondary | ICD-10-CM

## 2012-04-16 DIAGNOSIS — I739 Peripheral vascular disease, unspecified: Secondary | ICD-10-CM

## 2012-04-16 NOTE — Assessment & Plan Note (Signed)
He was behind in ICD follow up and we corrected this with this visit.

## 2012-04-16 NOTE — Progress Notes (Signed)
HPI The patient presents for follow up of CAD.  He had a stress test indicating  anterior ischemia mild but from base to apex.  I sent him for a cath demonstrating LAD a 60-70% stenosis, moderate size second diagonal with 50% stenosis, circumflex AV groove 75-80% stenosis with a branching obtuse marginal with a superior branch subtotal stenosis followed by diffuse disease, right coronary artery  had nonobstructive disease.  EF was 35%.   Since I last saw him he has had no new cardiovascular complaints  The patient denies any new symptoms such as chest discomfort, neck or arm discomfort. There has been no new shortness of breath, PND or orthopnea. There have been no reported palpitations, presyncope or syncope.  He again wants to quit tobacco.  He is limited by leg pain from his PVD.    No Known Allergies  Current Outpatient Prescriptions  Medication Sig Dispense Refill  . albuterol (PROVENTIL,VENTOLIN) 90 MCG/ACT inhaler Inhale 2 puffs into the lungs 4 (four) times daily as needed.        Marland Kitchen aspirin 81 MG tablet Take 81 mg by mouth daily.        . bisoprolol (ZEBETA) 10 MG tablet Take 1 tablet (10 mg total) by mouth daily.  30 tablet  6  . clopidogrel (PLAVIX) 75 MG tablet Take 1 tablet (75 mg total) by mouth daily.  30 tablet  11  . digoxin (LANOXIN) 0.125 MG tablet Take 1 tablet (125 mcg total) by mouth daily.  30 tablet  3  . enalapril (VASOTEC) 20 MG tablet Take 1 tablet (20 mg total) by mouth daily.  30 tablet  11  . gemfibrozil (LOPID) 600 MG tablet Take 1 tablet (600 mg total) by mouth 2 (two) times daily before a meal.  60 tablet  11  . isosorbide mononitrate (IMDUR) 30 MG CR tablet Take 1 tablet (30 mg total) by mouth every morning.  30 tablet  11  . multivitamin (THERAGRAN) per tablet Take 1 tablet by mouth daily.        . pravastatin (PRAVACHOL) 40 MG tablet Take 1 tablet (40 mg total) by mouth at bedtime.  90 tablet  1  . vitamin B-12 (CYANOCOBALAMIN) 1000 MCG tablet Take 1,000 mcg  by mouth daily.          Past Medical History  Diagnosis Date  . Diabetes mellitus     type 2  . GERD (gastroesophageal reflux disease)     with esophagitis  . PVD (peripheral vascular disease)     bilateral common iliac artery aneurysms. Right SFA occlusion over a long segment. Left SFA disease with occlusion of the left TP trunk. Artirogram Oct. 2006  . History of colonic polyps   . Diverticulosis of colon   . Sleep apnea   . Non-ischemic cardiomyopathy   . Coronary artery disease      LAD a 60-70% stenosis, moderate size second diagonal with 50% stenosis, circumflex AV groove 75-80% stenosis with a branching obtuse marginal with a superior branch subtotal stenosis followed by diffuse disease, right coronary artery  had nonobstructive disease.  EF was 35%  . COPD (chronic obstructive pulmonary disease)   . Tobacco abuse   . Urinary incontinence     detrussor instability    Past Surgical History  Procedure Date  . S/p icd placement      Medtronic Maximo [phone] single chamber  . Colonoscopy 04/2005  . Cardiac catheterization 06/2004    negative  . Pacemaker  insertion 11/2005  . Copd exacerbation     ROS:  Involuntary jerking.  Otherwise as stated in the HPI and negative for all other systems.  PHYSICAL EXAM BP 108/58  Pulse 60  Ht 5\' 10"  (1.778 m)  Wt 253 lb (114.76 kg)  BMI 36.30 kg/m2 GENERAL:  Well appearing NECK:  No jugular venous distention, waveform within normal limits, carotid upstroke brisk and symmetric, no bruits, no thyromegaly LUNGS:  Clear to auscultation bilaterally BACK:  No CVA tenderness CHEST: ICD pocket HEART:  PMI not displaced or sustained,S1 and S2 within normal limits, no S3, no S4, no clicks, no rubs, no murmurs ABD:  Flat, positive bowel sounds normal in frequency in pitch, no bruits, no rebound, no guarding, no midline pulsatile mass, no hepatomegaly, no splenomegaly, obese EXT:  2 plus pulses upper, absent DP/PT, no edema, no cyanosis no  clubbing, right wrist OK NEURO:  Cranial nerves II through XII grossly intact, motor grossly intact throughout PSYCH:  Cognitively intact, oriented to person place and time  EKG:  Sinus rhythm, rate 61, axis within normal limits, intervals within normal limits, no acute ST-T wave changes. 04/16/2012  ASSESSMENT AND PLAN

## 2012-04-16 NOTE — Assessment & Plan Note (Signed)
He seems to be euvolemic.  At this point, no change in therapy is indicated.  We have reviewed salt and fluid restrictions.  No further cardiovascular testing is indicated.   

## 2012-04-16 NOTE — Patient Instructions (Signed)
Your physician wants you to follow-up in: ONE YEAR WITH DR HOCHREIN You will receive a reminder letter in the mail two months in advance. If you don't receive a letter, please call our office to schedule the follow-up appointment.  

## 2012-04-16 NOTE — Assessment & Plan Note (Signed)
The patient has no new sypmtoms.  No further cardiovascular testing is indicated.  We will continue with aggressive risk reduction and meds as listed.  

## 2012-04-16 NOTE — Assessment & Plan Note (Signed)
He has been deemed not to be a candidate for revascularization.  He needs risk reduction.

## 2012-04-16 NOTE — Assessment & Plan Note (Signed)
We again talked about the need to stop smoking. He

## 2012-04-16 NOTE — Assessment & Plan Note (Signed)
He has moderately severe disease and is scheduled for follow up in six months.

## 2012-04-17 LAB — ICD DEVICE OBSERVATION
BATTERY VOLTAGE: 2.95 V
FVT: 0
PACEART VT: 0
TZAT-0004FASTVT: 8
TZAT-0004SLOWVT: 8
TZAT-0004SLOWVT: 8
TZAT-0005FASTVT: 88 pct
TZAT-0005SLOWVT: 84 pct
TZAT-0005SLOWVT: 91 pct
TZAT-0011SLOWVT: 10 ms
TZAT-0011SLOWVT: 10 ms
TZAT-0012FASTVT: 200 ms
TZAT-0012SLOWVT: 200 ms
TZAT-0012SLOWVT: 200 ms
TZAT-0013FASTVT: 1
TZAT-0019SLOWVT: 8 V
TZON-0003FASTVT: 240 ms
TZON-0003SLOWVT: 340 ms
TZON-0008SLOWVT: 0 ms
TZON-0011AFLUTTER: 70
TZST-0001FASTVT: 2
TZST-0001FASTVT: 4
TZST-0001FASTVT: 5
TZST-0001SLOWVT: 4
TZST-0003FASTVT: 25 J
TZST-0003FASTVT: 35 J
TZST-0003FASTVT: 35 J
TZST-0003SLOWVT: 15 J
TZST-0003SLOWVT: 35 J
VENTRICULAR PACING ICD: 0 pct
VF: 0

## 2012-04-17 NOTE — Progress Notes (Signed)
icd check in clinic  

## 2012-04-28 ENCOUNTER — Other Ambulatory Visit: Payer: Self-pay | Admitting: *Deleted

## 2012-04-28 MED ORDER — BISOPROLOL FUMARATE 10 MG PO TABS: 10.0000 mg | ORAL_TABLET | Freq: Every day | ORAL | Status: DC

## 2012-04-29 ENCOUNTER — Ambulatory Visit (INDEPENDENT_AMBULATORY_CARE_PROVIDER_SITE_OTHER): Payer: BC Managed Care – PPO | Admitting: Pulmonary Disease

## 2012-04-29 ENCOUNTER — Encounter: Payer: Self-pay | Admitting: Pulmonary Disease

## 2012-04-29 VITALS — BP 90/58 | HR 31 | Temp 97.7°F | Ht 70.0 in | Wt 256.4 lb

## 2012-04-29 DIAGNOSIS — G4733 Obstructive sleep apnea (adult) (pediatric): Secondary | ICD-10-CM | POA: Diagnosis not present

## 2012-04-29 NOTE — Assessment & Plan Note (Signed)
The patient has been unable to wear CPAP except on an occasional basis because of poor mask tolerance.  Now that 6 months has passed, I would like for him to try nasal pillows to see if this will allow him to wear the device more consistently.  If he continues to have issues, I suspect this is not a viable treatment option for him.  Would then change him over to nocturnal oxygen.  The patient is to let me know in 4 weeks how things are going.

## 2012-04-29 NOTE — Progress Notes (Signed)
  Subjective:    Patient ID: [name], male    DOB: 05/28/1956, 56 y.o.   MRN: [id number]  HPI The patient comes in today for followup of his known obstructive sleep apnea.  At the last visit, he was having issues with mask tolerance, and the decision was made to try nasal pillows.  His insurance would not cover the nasal pillows until he had gone 6 months on his old mask, and therefore he never received these.  He continues to wear her CPAP intermittently, and is not able to tolerate anything on his face.  He does not feel pressure is an issue.  He is willing to continue working with CPAP for now.   Review of Systems  Constitutional: Negative.  Negative for fever and unexpected weight change.  HENT: Negative.  Negative for ear pain, nosebleeds, congestion, sore throat, rhinorrhea, sneezing, trouble swallowing, dental problem, postnasal drip and sinus pressure.   Eyes: Negative.  Negative for redness and itching.  Respiratory: Positive for cough and shortness of breath. Negative for chest tightness and wheezing.   Cardiovascular: Negative.  Negative for palpitations and leg swelling.  Gastrointestinal: Negative.  Negative for nausea and vomiting.  Genitourinary: Negative.  Negative for dysuria.  Musculoskeletal: Negative.  Negative for joint swelling.  Skin: Negative.  Negative for rash.  Neurological: Negative.  Negative for headaches.  Hematological: Negative.  Does not bruise/bleed easily.  Psychiatric/Behavioral: Negative.  Negative for dysphoric mood. The patient is not nervous/anxious.        Objective:   Physical Exam Obese male in no acute distress No skin breakdown or pressure necrosis from the CPAP mask Chest clear to auscultation except for rhonchi Cardiac exam with regular rate and rhythm Lower extremities with mild edema, no cyanosis Patient appears sleepy, however communicative, moves all 4 extremities.       Assessment & Plan:

## 2012-04-29 NOTE — Patient Instructions (Signed)
Will try nasal pillows with/without chin strap now that insurance will cover new mask Please call me in 4 weeks with your response. Work on [employer] loss Schedule appt with me for 6mos, but may change dependent upon how things go.

## 2012-06-23 ENCOUNTER — Other Ambulatory Visit: Payer: Self-pay

## 2012-06-23 MED ORDER — DIGOXIN 125 MCG PO TABS: 125.0000 ug | ORAL_TABLET | Freq: Every day | ORAL | Status: DC

## 2012-06-23 NOTE — Telephone Encounter (Signed)
..   Requested Prescriptions   Signed Prescriptions Disp Refills  . digoxin (LANOXIN) 0.125 MG tablet 30 tablet 6    Sig: Take 1 tablet (125 mcg total) by mouth daily.    Authorizing Provider: Rollene Rotunda    Ordering User: Christella Hartigan, Venetta Knee Judie Petit

## 2012-07-28 ENCOUNTER — Encounter: Payer: BC Managed Care – PPO | Admitting: Internal Medicine

## 2012-07-31 ENCOUNTER — Encounter: Payer: Self-pay | Admitting: Internal Medicine

## 2012-08-04 ENCOUNTER — Encounter: Payer: BC Managed Care – PPO | Admitting: Internal Medicine

## 2012-08-27 ENCOUNTER — Encounter: Payer: Self-pay | Admitting: Internal Medicine

## 2012-08-27 ENCOUNTER — Ambulatory Visit (INDEPENDENT_AMBULATORY_CARE_PROVIDER_SITE_OTHER): Payer: BC Managed Care – PPO | Admitting: Internal Medicine

## 2012-08-27 VITALS — BP 140/80 | HR 80 | Temp 98.4°F | Ht 70.0 in | Wt 256.0 lb

## 2012-08-27 DIAGNOSIS — B079 Viral wart, unspecified: Secondary | ICD-10-CM | POA: Insufficient documentation

## 2012-08-27 DIAGNOSIS — L570 Actinic keratosis: Secondary | ICD-10-CM

## 2012-08-27 NOTE — Assessment & Plan Note (Signed)
Large and 2 smaller lesions treated with liquid nitrogen 60 seconds x 2 for larger 30 seconds x 2 for smaller Discussed home care Derm if recurs

## 2012-08-27 NOTE — Progress Notes (Signed)
Subjective:    Patient ID: [name], male    DOB: 1956/12/12, 56 y.o.   MRN: [id number]  HPI Has some spots that his wife wants checked May go back 6 months or so  Has spot on left and near [facility] One on right cheek Start as pimple than enlarge  May have another one coming along mustache on right  Mild tenderness No discharge No Rx as yet Current Outpatient Prescriptions on File Prior to Visit  Medication Sig Dispense Refill  . albuterol (PROVENTIL,VENTOLIN) 90 MCG/ACT inhaler Inhale 2 puffs into the lungs 4 (four) times daily as needed.        Marland Kitchen aspirin 81 MG tablet Take 81 mg by mouth daily.        . bisoprolol (ZEBETA) 10 MG tablet Take 1 tablet (10 mg total) by mouth daily.  30 tablet  6  . clopidogrel (PLAVIX) 75 MG tablet Take 1 tablet (75 mg total) by mouth daily.  30 tablet  11  . digoxin (LANOXIN) 0.125 MG tablet Take 1 tablet (125 mcg total) by mouth daily.  30 tablet  6  . enalapril (VASOTEC) 20 MG tablet Take 1 tablet (20 mg total) by mouth daily.  30 tablet  11  . gemfibrozil (LOPID) 600 MG tablet Take 1 tablet (600 mg total) by mouth 2 (two) times daily before a meal.  60 tablet  11  . isosorbide mononitrate (IMDUR) 30 MG CR tablet Take 1 tablet (30 mg total) by mouth every morning.  30 tablet  11  . multivitamin (THERAGRAN) per tablet Take 1 tablet by mouth daily.        . pravastatin (PRAVACHOL) 40 MG tablet Take 1 tablet (40 mg total) by mouth at bedtime.  90 tablet  1  . vitamin B-12 (CYANOCOBALAMIN) 1000 MCG tablet Take 1,000 mcg by mouth daily.          No Known Allergies  Past Medical History  Diagnosis Date  . Diabetes mellitus     type 2  . GERD (gastroesophageal reflux disease)     with esophagitis  . PVD (peripheral vascular disease)     bilateral common iliac artery aneurysms. Right SFA occlusion over a long segment. Left SFA disease with occlusion of the left TP trunk. Artirogram Oct. 2006  . History of colonic polyps   . Diverticulosis of  colon   . Sleep apnea   . Non-ischemic cardiomyopathy   . Coronary artery disease      LAD a 60-70% stenosis, moderate size second diagonal with 50% stenosis, circumflex AV groove 75-80% stenosis with a branching obtuse marginal with a superior branch subtotal stenosis followed by diffuse disease, right coronary artery  had nonobstructive disease.  EF was 35%  . COPD (chronic obstructive pulmonary disease)   . Tobacco abuse   . Urinary incontinence     detrussor instability    Past Surgical History  Procedure Date  . S/p icd placement      Medtronic Maximo [phone] single chamber  . Colonoscopy 04/2005  . Cardiac catheterization 06/2004    negative  . Pacemaker insertion 11/2005  . Copd exacerbation     Family History  Problem Relation Age of Onset  . Stroke Father   . Coronary artery disease Maternal Aunt   . Heart failure Maternal Aunt   . Lung cancer Maternal Aunt   . Lung cancer Maternal Uncle   . Cancer Brother     Mouth  . Cancer Sister  throat    History   Social History  . Marital Status: Married    Spouse Name: N/A    Number of Children: N/A  . Years of Education: N/A   Occupational History  . Not on file.   Social History Main Topics  . Smoking status: Current Everyday Smoker -- 2.0 packs/day for 40 years    Types: Cigarettes  . Smokeless tobacco: Never Used   Comment: GAVE 1-800-QUIT-NOW  . Alcohol Use: No  . Drug Use: Not on file  . Sexually Active: Not on file   Other Topics Concern  . Not on file   Social History Narrative  . No narrative on file   Review of Systems No illness No fever Still very weak--falls if he walks much Spends a lot of time in bed     Objective:   Physical Exam  Constitutional: He appears well-developed and well-nourished. No distress.  Skin:       Raised actinic on right cheek and 2 smaller ones below it Apparent wart 6x59mm on dorsum of left hand          Assessment & Plan:

## 2012-08-27 NOTE — Assessment & Plan Note (Signed)
rx with liquid nitrogen for 60 seconds x 2 Discussed home care

## 2012-10-26 ENCOUNTER — Ambulatory Visit (INDEPENDENT_AMBULATORY_CARE_PROVIDER_SITE_OTHER): Payer: BC Managed Care – PPO | Admitting: Internal Medicine

## 2012-10-26 ENCOUNTER — Encounter: Payer: Self-pay | Admitting: Internal Medicine

## 2012-10-26 VITALS — BP 128/80 | HR 62 | Temp 97.5°F | Ht 70.0 in | Wt 259.0 lb

## 2012-10-26 DIAGNOSIS — E785 Hyperlipidemia, unspecified: Secondary | ICD-10-CM | POA: Diagnosis not present

## 2012-10-26 DIAGNOSIS — E119 Type 2 diabetes mellitus without complications: Secondary | ICD-10-CM

## 2012-10-26 DIAGNOSIS — I5022 Chronic systolic (congestive) heart failure: Secondary | ICD-10-CM | POA: Diagnosis not present

## 2012-10-26 DIAGNOSIS — Z0000 Encounter for general adult medical examination without abnormal findings: Secondary | ICD-10-CM | POA: Insufficient documentation

## 2012-10-26 DIAGNOSIS — I798 Other disorders of arteries, arterioles and capillaries in diseases classified elsewhere: Secondary | ICD-10-CM

## 2012-10-26 DIAGNOSIS — Z23 Encounter for immunization: Secondary | ICD-10-CM

## 2012-10-26 LAB — BASIC METABOLIC PANEL
BUN: 12 mg/dL (ref 6–23)
Chloride: 104 mEq/L (ref 96–112)
Glucose, Bld: 64 mg/dL — ABNORMAL LOW (ref 70–99)
Potassium: 5.1 mEq/L (ref 3.5–5.1)

## 2012-10-26 LAB — CBC WITH DIFFERENTIAL/PLATELET
Eosinophils Relative: 1.5 % (ref 0.0–5.0)
HCT: 44.1 % (ref 39.0–52.0)
Hemoglobin: 15 g/dL (ref 13.0–17.0)
Lymphs Abs: 4.8 10*3/uL — ABNORMAL HIGH (ref 0.7–4.0)
MCV: 85.7 fl (ref 78.0–100.0)
Monocytes Absolute: 0.8 10*3/uL (ref 0.1–1.0)
Neutro Abs: 5.7 10*3/uL (ref 1.4–7.7)
Platelets: 276 10*3/uL (ref 150.0–400.0)
RDW: 13.2 % (ref 11.5–14.6)

## 2012-10-26 LAB — LIPID PANEL
Cholesterol: 143 mg/dL (ref 0–200)
HDL: 39.7 mg/dL (ref >39.00)
Triglycerides: 305 mg/dL — ABNORMAL HIGH (ref 0.0–149.0)

## 2012-10-26 LAB — HEPATIC FUNCTION PANEL
AST: 16 U/L (ref 0–37)
Albumin: 4.2 g/dL (ref 3.5–5.2)
Total Protein: 7.4 g/dL (ref 6.0–8.3)

## 2012-10-26 LAB — HEMOGLOBIN A1C: Hgb A1c MFr Bld: 6.2 % (ref 4.6–6.5)

## 2012-10-26 LAB — TSH: TSH: 0.68 u[IU]/mL (ref 0.35–5.50)

## 2012-10-26 NOTE — Progress Notes (Signed)
Subjective:    Patient ID: [name], male    DOB: Jul 14, 1956, 56 y.o.   MRN: [id number]  HPI Here for physical Has [employer] through wife and Medicare because of disability  No new concerns Skin lesions have resolved after Rx  Does have some arm burning on extensor distal arms bilaterally  Redid his handicapped parking Reviewed colonoscopy--- 5/06 and only hyperplastic polyps Discussed PSA--he doesn't want it  Still limited in walking due to legs Has fallen due to legs giving out Still not ready to stop smoking   Current Outpatient Prescriptions on File Prior to Visit  Medication Sig Dispense Refill  . albuterol (PROVENTIL,VENTOLIN) 90 MCG/ACT inhaler Inhale 2 puffs into the lungs 4 (four) times daily as needed.        Marland Kitchen aspirin 81 MG tablet Take 81 mg by mouth daily.        . bisoprolol (ZEBETA) 10 MG tablet Take 1 tablet (10 mg total) by mouth daily.  30 tablet  6  . digoxin (LANOXIN) 0.125 MG tablet Take 1 tablet (125 mcg total) by mouth daily.  30 tablet  6  . enalapril (VASOTEC) 20 MG tablet Take 1 tablet (20 mg total) by mouth daily.  30 tablet  11  . gemfibrozil (LOPID) 600 MG tablet Take 1 tablet (600 mg total) by mouth 2 (two) times daily before a meal.  60 tablet  11  . multivitamin (THERAGRAN) per tablet Take 1 tablet by mouth daily.        . pravastatin (PRAVACHOL) 40 MG tablet Take 1 tablet (40 mg total) by mouth at bedtime.  90 tablet  1  . vitamin B-12 (CYANOCOBALAMIN) 1000 MCG tablet Take 1,000 mcg by mouth daily.          No Known Allergies  Past Medical History  Diagnosis Date  . Diabetes mellitus     type 2  . GERD (gastroesophageal reflux disease)     with esophagitis  . PVD (peripheral vascular disease)     bilateral common iliac artery aneurysms. Right SFA occlusion over a long segment. Left SFA disease with occlusion of the left TP trunk. Artirogram Oct. 2006  . History of colonic polyps   . Diverticulosis of colon   . Sleep apnea   .  Non-ischemic cardiomyopathy   . Coronary artery disease      LAD a 60-70% stenosis, moderate size second diagonal with 50% stenosis, circumflex AV groove 75-80% stenosis with a branching obtuse marginal with a superior branch subtotal stenosis followed by diffuse disease, right coronary artery  had nonobstructive disease.  EF was 35%  . COPD (chronic obstructive pulmonary disease)   . Tobacco abuse   . Urinary incontinence     detrussor instability    Past Surgical History  Procedure Date  . S/p icd placement      Medtronic Maximo [phone] single chamber  . Colonoscopy 04/2005  . Cardiac catheterization 06/2004    negative  . Pacemaker insertion 11/2005  . Copd exacerbation     Family History  Problem Relation Age of Onset  . Stroke Father   . Coronary artery disease Maternal Aunt   . Heart failure Maternal Aunt   . Lung cancer Maternal Aunt   . Lung cancer Maternal Uncle   . Cancer Brother     Mouth  . Cancer Sister     throat    History   Social History  . Marital Status: Married    Spouse Name: N/A  Number of Children: N/A  . Years of Education: N/A   Occupational History  . Not on file.   Social History Main Topics  . Smoking status: Current Every Day Smoker -- 2.0 packs/day for 40 years    Types: Cigarettes  . Smokeless tobacco: Never Used     Comment: GAVE 1-800-QUIT-NOW  . Alcohol Use: No  . Drug Use: Not on file  . Sexually Active: Not on file   Other Topics Concern  . Not on file   Social History Narrative  . No narrative on file   Review of Systems  Constitutional: Negative for fatigue and unexpected weight change.       Weight is up 3# Wears seat belt Has cut down on cigarettes  HENT: Positive for hearing loss, sneezing and tinnitus. Negative for congestion, rhinorrhea and dental problem.        Edentulous--no dentures   Eyes: Negative for visual disturbance.       Occ blurry vision  Doesn't check sugars  Respiratory: Positive for cough  and shortness of breath. Negative for chest tightness.        Uses inhaler once or twice a week  Cardiovascular: Positive for chest pain. Negative for palpitations and leg swelling.       Occ chest pain--better lately   Gastrointestinal: Negative for nausea, vomiting, abdominal pain, constipation and blood in stool.       Occ heartburn--hasn't needed regular meds  Genitourinary: Positive for frequency and difficulty urinating.       Some slow stream and occ incontinence  Musculoskeletal: Positive for back pain. Negative for joint swelling and arthralgias.       Back pain if in bed much  Skin: Negative for rash.       No suspicious lesions  Neurological: Positive for dizziness, numbness and headaches. Negative for syncope, weakness and light-headedness.       Relates dizziness to past "energy pill"---now off it Pain, sensory loss in feet  Hematological: Negative for adenopathy. Bruises/bleeds easily.  Psychiatric/Behavioral: Positive for sleep disturbance and dysphoric mood. The patient is not nervous/anxious.        Sleep apnea---can't use the CPAP. Sees Dr Shelle Iron Chronic depression       Objective:   Physical Exam  Constitutional: He is oriented to person, place, and time. He appears well-developed and well-nourished. No distress.  HENT:  Head: Normocephalic and atraumatic.  Right Ear: External ear normal.  Left Ear: External ear normal.  Mouth/Throat: Oropharynx is clear and moist. No oropharyngeal exudate.       edentulous  Eyes: Conjunctivae normal and EOM are normal. Pupils are equal, round, and reactive to light.  Neck: Normal range of motion. Neck supple. No thyromegaly present.  Cardiovascular: Normal rate, regular rhythm and normal heart sounds.  Exam reveals no gallop.   No murmur heard.      No pedal pulses  Pulmonary/Chest: Effort normal and breath sounds normal. No respiratory distress. He has no wheezes. He has no rales.  Abdominal: Soft. There is no tenderness.    Musculoskeletal: He exhibits no edema and no tenderness.  Lymphadenopathy:    He has no cervical adenopathy.  Neurological: He is alert and oriented to person, place, and time.  Skin:       callouses on plantar feet No ulcers Mycotic toenails  Psychiatric: His behavior is normal.       dysthymic          Assessment & Plan:

## 2012-10-26 NOTE — Addendum Note (Signed)
Addended by: Sueanne Margarita on: 10/26/2012 12:49 PM   Modules accepted: Orders

## 2012-10-26 NOTE — Assessment & Plan Note (Signed)
Recheck labs Now on gemfibrozil also

## 2012-10-26 NOTE — Assessment & Plan Note (Signed)
Has been diet controlled Will check labs

## 2012-10-26 NOTE — Assessment & Plan Note (Signed)
Still his limiting factor

## 2012-10-26 NOTE — Assessment & Plan Note (Signed)
Limited mostly by vascular disease Occ angina now but doesn't need Rx

## 2012-10-26 NOTE — Assessment & Plan Note (Signed)
UTD with imms Colonoscopy due 2016 No PSA after discussion Not willing to stop smoking but has cut down

## 2012-10-27 ENCOUNTER — Encounter: Payer: Self-pay | Admitting: *Deleted

## 2012-10-27 LAB — DIGOXIN LEVEL: Digoxin Level: <0.1 ng/mL — ABNORMAL LOW (ref 0.8–2.0)

## 2012-10-30 ENCOUNTER — Ambulatory Visit: Payer: BC Managed Care – PPO | Admitting: Pulmonary Disease

## 2012-11-03 ENCOUNTER — Other Ambulatory Visit: Payer: Self-pay

## 2012-11-03 DIAGNOSIS — E782 Mixed hyperlipidemia: Secondary | ICD-10-CM

## 2012-11-03 MED ORDER — PRAVASTATIN SODIUM 40 MG PO TABS: 40.0000 mg | ORAL_TABLET | Freq: Every day | ORAL | Status: DC

## 2012-11-03 MED ORDER — CLOPIDOGREL BISULFATE 75 MG PO TABS: 75.0000 mg | ORAL_TABLET | Freq: Every day | ORAL | Status: DC

## 2012-11-03 NOTE — Telephone Encounter (Signed)
Requested Prescriptions     Pending Prescriptions Disp Refills   • clopidogrel (PLAVIX) 75 MG tablet 90 tablet 3     Sig: Take 1 tablet (75 mg total) by mouth daily

## 2012-11-03 NOTE — Telephone Encounter (Signed)
..   Requested Prescriptions   Signed Prescriptions Disp Refills  . pravastatin (PRAVACHOL) 40 MG tablet 90 tablet 3    Sig: Take 1 tablet (40 mg total) by mouth at bedtime.    Authorizing Provider: Rollene Rotunda    Ordering User: Christella Hartigan, Emmilynn Marut Judie Petit

## 2012-11-03 NOTE — Telephone Encounter (Signed)
..   Requested Prescriptions   Signed Prescriptions Disp Refills  . pravastatin (PRAVACHOL) 40 MG tablet 90 tablet 3    Sig: Take 1 tablet (40 mg total) by mouth at bedtime.    Authorizing Provider: Rollene Rotunda    Ordering User: Kassie Keng M  refilled again first one did not go through

## 2012-11-17 ENCOUNTER — Other Ambulatory Visit: Payer: Self-pay | Admitting: Cardiology

## 2012-11-17 MED ORDER — ISOSORBIDE MONONITRATE 20 MG PO TABS: 20.0000 mg | ORAL_TABLET | Freq: Two times a day (BID) | ORAL | Status: DC

## 2012-12-15 ENCOUNTER — Encounter (HOSPITAL_COMMUNITY): Payer: Self-pay | Admitting: *Deleted

## 2012-12-15 ENCOUNTER — Observation Stay (HOSPITAL_COMMUNITY)
Admission: EM | Admit: 2012-12-15 | Discharge: 2012-12-18 | DRG: 541 | Disposition: A | Discharge status: Home / Self Care | Payer: BC Managed Care – PPO | Attending: Internal Medicine | Admitting: Internal Medicine

## 2012-12-15 ENCOUNTER — Emergency Department (HOSPITAL_COMMUNITY): Payer: BC Managed Care – PPO

## 2012-12-15 DIAGNOSIS — E538 Deficiency of other specified B group vitamins: Secondary | ICD-10-CM

## 2012-12-15 DIAGNOSIS — K644 Residual hemorrhoidal skin tags: Secondary | ICD-10-CM

## 2012-12-15 DIAGNOSIS — G4733 Obstructive sleep apnea (adult) (pediatric): Secondary | ICD-10-CM

## 2012-12-15 DIAGNOSIS — K2100 Gastro-esophageal reflux disease with esophagitis, without bleeding: Secondary | ICD-10-CM

## 2012-12-15 DIAGNOSIS — R32 Unspecified urinary incontinence: Secondary | ICD-10-CM

## 2012-12-15 DIAGNOSIS — K219 Gastro-esophageal reflux disease without esophagitis: Secondary | ICD-10-CM

## 2012-12-15 DIAGNOSIS — Z95810 Presence of automatic (implantable) cardiac defibrillator: Secondary | ICD-10-CM

## 2012-12-15 DIAGNOSIS — J219 Acute bronchiolitis, unspecified: Secondary | ICD-10-CM

## 2012-12-15 DIAGNOSIS — I739 Peripheral vascular disease, unspecified: Secondary | ICD-10-CM | POA: Diagnosis not present

## 2012-12-15 DIAGNOSIS — J4489 Other specified chronic obstructive pulmonary disease: Secondary | ICD-10-CM

## 2012-12-15 DIAGNOSIS — B079 Viral wart, unspecified: Secondary | ICD-10-CM

## 2012-12-15 DIAGNOSIS — G589 Mononeuropathy, unspecified: Secondary | ICD-10-CM | POA: Diagnosis not present

## 2012-12-15 DIAGNOSIS — I2510 Atherosclerotic heart disease of native coronary artery without angina pectoris: Secondary | ICD-10-CM | POA: Diagnosis not present

## 2012-12-15 DIAGNOSIS — R509 Fever, unspecified: Secondary | ICD-10-CM

## 2012-12-15 DIAGNOSIS — Z79899 Other long term (current) drug therapy: Secondary | ICD-10-CM | POA: Insufficient documentation

## 2012-12-15 DIAGNOSIS — J40 Bronchitis, not specified as acute or chronic: Secondary | ICD-10-CM | POA: Diagnosis not present

## 2012-12-15 DIAGNOSIS — E1151 Type 2 diabetes mellitus with diabetic peripheral angiopathy without gangrene: Secondary | ICD-10-CM | POA: Diagnosis present

## 2012-12-15 DIAGNOSIS — E1159 Type 2 diabetes mellitus with other circulatory complications: Secondary | ICD-10-CM

## 2012-12-15 DIAGNOSIS — R0602 Shortness of breath: Secondary | ICD-10-CM | POA: Diagnosis present

## 2012-12-15 DIAGNOSIS — K5730 Diverticulosis of large intestine without perforation or abscess without bleeding: Secondary | ICD-10-CM

## 2012-12-15 DIAGNOSIS — I428 Other cardiomyopathies: Secondary | ICD-10-CM

## 2012-12-15 DIAGNOSIS — E1142 Type 2 diabetes mellitus with diabetic polyneuropathy: Secondary | ICD-10-CM | POA: Diagnosis present

## 2012-12-15 DIAGNOSIS — E669 Obesity, unspecified: Secondary | ICD-10-CM

## 2012-12-15 DIAGNOSIS — Z860100 Personal history of colon polyps, unspecified: Secondary | ICD-10-CM

## 2012-12-15 DIAGNOSIS — Z72821 Inadequate sleep hygiene: Secondary | ICD-10-CM

## 2012-12-15 DIAGNOSIS — J111 Influenza due to unidentified influenza virus with other respiratory manifestations: Secondary | ICD-10-CM | POA: Diagnosis not present

## 2012-12-15 DIAGNOSIS — G540 Brachial plexus disorders: Secondary | ICD-10-CM

## 2012-12-15 DIAGNOSIS — F17200 Nicotine dependence, unspecified, uncomplicated: Secondary | ICD-10-CM

## 2012-12-15 DIAGNOSIS — J441 Chronic obstructive pulmonary disease with (acute) exacerbation: Principal | ICD-10-CM

## 2012-12-15 DIAGNOSIS — R9431 Abnormal electrocardiogram [ECG] [EKG]: Secondary | ICD-10-CM

## 2012-12-15 DIAGNOSIS — E785 Hyperlipidemia, unspecified: Secondary | ICD-10-CM

## 2012-12-15 DIAGNOSIS — Z0000 Encounter for general adult medical examination without abnormal findings: Secondary | ICD-10-CM

## 2012-12-15 DIAGNOSIS — I5022 Chronic systolic (congestive) heart failure: Secondary | ICD-10-CM | POA: Diagnosis not present

## 2012-12-15 DIAGNOSIS — E782 Mixed hyperlipidemia: Secondary | ICD-10-CM

## 2012-12-15 DIAGNOSIS — I25119 Atherosclerotic heart disease of native coronary artery with unspecified angina pectoris: Secondary | ICD-10-CM | POA: Diagnosis present

## 2012-12-15 DIAGNOSIS — Z720 Tobacco use: Secondary | ICD-10-CM | POA: Diagnosis present

## 2012-12-15 DIAGNOSIS — L570 Actinic keratosis: Secondary | ICD-10-CM

## 2012-12-15 DIAGNOSIS — J449 Chronic obstructive pulmonary disease, unspecified: Secondary | ICD-10-CM

## 2012-12-15 DIAGNOSIS — I798 Other disorders of arteries, arterioles and capillaries in diseases classified elsewhere: Secondary | ICD-10-CM

## 2012-12-15 DIAGNOSIS — R0989 Other specified symptoms and signs involving the circulatory and respiratory systems: Secondary | ICD-10-CM

## 2012-12-15 LAB — CBC WITH DIFFERENTIAL/PLATELET
Basophils Absolute: 0 10*3/uL (ref 0.0–0.1)
Eosinophils Absolute: 0 10*3/uL (ref 0.0–0.7)
Eosinophils Relative: 0 % (ref 0–5)
Lymphs Abs: 1.6 10*3/uL (ref 0.7–4.0)
MCH: 29.6 pg (ref 26.0–34.0)
MCHC: 36.7 g/dL — ABNORMAL HIGH (ref 30.0–36.0)
MCV: 80.6 fL (ref 78.0–100.0)
Platelets: 213 10*3/uL (ref 150–400)
RDW: 13 % (ref 11.5–15.5)

## 2012-12-15 LAB — COMPREHENSIVE METABOLIC PANEL WITH GFR
ALT: 44 U/L (ref 0–53)
AST: 39 U/L — ABNORMAL HIGH (ref 0–37)
Albumin: 4.1 g/dL (ref 3.5–5.2)
Alkaline Phosphatase: 96 U/L (ref 39–117)
BUN: 13 mg/dL (ref 6–23)
CO2: 23 meq/L (ref 19–32)
Calcium: 9.7 mg/dL (ref 8.4–10.5)
Chloride: 97 meq/L (ref 96–112)
Creatinine, Ser: 0.73 mg/dL (ref 0.50–1.35)
GFR calc Af Amer: >90 mL/min
GFR calc non Af Amer: >90 mL/min
Glucose, Bld: 109 mg/dL — ABNORMAL HIGH (ref 70–99)
Potassium: 4.5 meq/L (ref 3.5–5.1)
Sodium: 134 meq/L — ABNORMAL LOW (ref 135–145)
Total Bilirubin: 0.5 mg/dL (ref 0.3–1.2)
Total Protein: 7.8 g/dL (ref 6.0–8.3)

## 2012-12-15 LAB — PRO B NATRIURETIC PEPTIDE: Pro B Natriuretic peptide (BNP): 48.9 pg/mL (ref 0–125)

## 2012-12-15 LAB — TROPONIN I: Troponin I: <0.3 ng/mL (ref <0.30)

## 2012-12-15 LAB — GLUCOSE, CAPILLARY

## 2012-12-15 MED ORDER — METHYLPREDNISOLONE SODIUM SUCC 125 MG IJ SOLR
125.0000 mg | Freq: Two times a day (BID) | INTRAMUSCULAR | Status: DC
  Filled 2012-12-15 (×2): qty 2

## 2012-12-15 MED ORDER — METHYLPREDNISOLONE SODIUM SUCC 125 MG IJ SOLR
125.0000 mg | Freq: Once | INTRAMUSCULAR | Status: AC
  Administered 2012-12-15: 125 mg via INTRAVENOUS
  Filled 2012-12-15: qty 2

## 2012-12-15 MED ORDER — IPRATROPIUM BROMIDE 0.02 % IN SOLN
0.5000 mg | RESPIRATORY_TRACT | Status: DC
  Administered 2012-12-15 (×2): 0.5 mg via RESPIRATORY_TRACT
  Filled 2012-12-15 (×2): qty 2.5

## 2012-12-15 MED ORDER — ONDANSETRON HCL 4 MG/2ML IJ SOLN: 4.0000 mg | Freq: Four times a day (QID) | INTRAMUSCULAR | Status: DC | PRN

## 2012-12-15 MED ORDER — ZOLPIDEM TARTRATE 5 MG PO TABS: 5.0000 mg | ORAL_TABLET | Freq: Every evening | ORAL | Status: DC | PRN

## 2012-12-15 MED ORDER — ALBUTEROL SULFATE (5 MG/ML) 0.5% IN NEBU
5.0000 mg | INHALATION_SOLUTION | Freq: Once | RESPIRATORY_TRACT | Status: AC
  Administered 2012-12-15: 5 mg via RESPIRATORY_TRACT
  Filled 2012-12-15 (×2): qty 1

## 2012-12-15 MED ORDER — SODIUM CHLORIDE 0.9 % IV SOLN
INTRAVENOUS | Status: DC
  Administered 2012-12-15: 23:00:00 via INTRAVENOUS

## 2012-12-15 MED ORDER — ALBUTEROL SULFATE (5 MG/ML) 0.5% IN NEBU: 2.5000 mg | INHALATION_SOLUTION | RESPIRATORY_TRACT | Status: DC | PRN

## 2012-12-15 MED ORDER — ACETAMINOPHEN 325 MG PO TABS: 650.0000 mg | ORAL_TABLET | Freq: Four times a day (QID) | ORAL | Status: DC | PRN

## 2012-12-15 MED ORDER — ENOXAPARIN SODIUM 40 MG/0.4ML ~~LOC~~ SOLN
40.0000 mg | SUBCUTANEOUS | Status: DC
  Administered 2012-12-16 – 2012-12-17 (×2): 40 mg via SUBCUTANEOUS
  Filled 2012-12-15 (×3): qty 0.4

## 2012-12-15 MED ORDER — ALBUTEROL SULFATE (5 MG/ML) 0.5% IN NEBU
2.5000 mg | INHALATION_SOLUTION | Freq: Four times a day (QID) | RESPIRATORY_TRACT | Status: DC
  Administered 2012-12-15: 2.5 mg via RESPIRATORY_TRACT
  Filled 2012-12-15 (×2): qty 0.5

## 2012-12-15 MED ORDER — INSULIN ASPART 100 UNIT/ML ~~LOC~~ SOLN
0.0000 [IU] | SUBCUTANEOUS | Status: DC
  Administered 2012-12-16: 3 [IU] via SUBCUTANEOUS
  Administered 2012-12-16 (×2): 7 [IU] via SUBCUTANEOUS
  Administered 2012-12-16: 3 [IU] via SUBCUTANEOUS
  Administered 2012-12-16: 11 [IU] via SUBCUTANEOUS
  Administered 2012-12-16: 3 [IU] via SUBCUTANEOUS
  Administered 2012-12-17: 4 [IU] via SUBCUTANEOUS
  Administered 2012-12-17: 7 [IU] via SUBCUTANEOUS
  Administered 2012-12-17 (×2): 4 [IU] via SUBCUTANEOUS
  Administered 2012-12-17: 11 [IU] via SUBCUTANEOUS

## 2012-12-15 MED ORDER — ONDANSETRON HCL 4 MG PO TABS: 4.0000 mg | ORAL_TABLET | Freq: Four times a day (QID) | ORAL | Status: DC | PRN

## 2012-12-15 MED ORDER — ASPIRIN EC 325 MG PO TBEC
325.0000 mg | DELAYED_RELEASE_TABLET | Freq: Every day | ORAL | Status: DC
  Filled 2012-12-15: qty 1

## 2012-12-15 MED ORDER — DEXTROSE 5 % IV SOLN
500.0000 mg | INTRAVENOUS | Status: DC
  Administered 2012-12-15: 500 mg via INTRAVENOUS
  Filled 2012-12-15: qty 500

## 2012-12-15 MED ORDER — HYDROMORPHONE HCL PF 1 MG/ML IJ SOLN: 0.5000 mg | INTRAMUSCULAR | Status: DC | PRN

## 2012-12-15 MED ORDER — ALUM & MAG HYDROXIDE-SIMETH 200-200-20 MG/5ML PO SUSP: 30.0000 mL | Freq: Four times a day (QID) | ORAL | Status: DC | PRN

## 2012-12-15 MED ORDER — METHYLPREDNISOLONE SODIUM SUCC 125 MG IJ SOLR: 80.0000 mg | Freq: Two times a day (BID) | INTRAMUSCULAR | Status: DC

## 2012-12-15 MED ORDER — ACETAMINOPHEN 650 MG RE SUPP: 650.0000 mg | Freq: Four times a day (QID) | RECTAL | Status: DC | PRN

## 2012-12-15 MED ORDER — OSELTAMIVIR PHOSPHATE 75 MG PO CAPS
75.0000 mg | ORAL_CAPSULE | Freq: Two times a day (BID) | ORAL | Status: DC
  Administered 2012-12-16 – 2012-12-17 (×5): 75 mg via ORAL
  Filled 2012-12-15 (×8): qty 1

## 2012-12-15 MED ORDER — ALBUTEROL SULFATE (5 MG/ML) 0.5% IN NEBU
5.0000 mg | INHALATION_SOLUTION | RESPIRATORY_TRACT | Status: DC
  Administered 2012-12-15 (×2): 5 mg via RESPIRATORY_TRACT
  Filled 2012-12-15: qty 1

## 2012-12-15 MED ORDER — HYDROCODONE-ACETAMINOPHEN 5-325 MG PO TABS
1.0000 | ORAL_TABLET | Freq: Once | ORAL | Status: AC
  Administered 2012-12-15: 1 via ORAL
  Filled 2012-12-15: qty 1

## 2012-12-15 MED ORDER — OXYCODONE HCL 5 MG PO TABS: 5.0000 mg | ORAL_TABLET | ORAL | Status: DC | PRN

## 2012-12-15 MED ORDER — ACETAMINOPHEN 325 MG PO TABS
650.0000 mg | ORAL_TABLET | Freq: Once | ORAL | Status: AC
  Administered 2012-12-15: 650 mg via ORAL
  Filled 2012-12-15: qty 2

## 2012-12-15 NOTE — ED Notes (Signed)
Pt placed on 2L [place] due to o2 stats being 90% after breathing tx.

## 2012-12-15 NOTE — ED Notes (Signed)
Pt A.O. X 4. Resting comfortably watching TV. Denies SOB. Updated on plan of care. Aware that two separate consults have been ordered. One from the hospitalist and one from the cardiologist. Aware of the next step will be the consult assessment. Given sprite per request. No further needs at  This time. Family at bedside.

## 2012-12-15 NOTE — ED Provider Notes (Signed)
History     CSN: [id number]  Arrival date & time 12/15/12  1928   First MD Initiated Contact with Patient 12/15/12 2022      Chief Complaint  Patient presents with  . Shortness of Breath  . Cough    (Consider location/radiation/quality/duration/timing/severity/associated sxs/prior treatment) HPIJohn Lacretia Nicks [name] is a 56 y.o. male presenting with 3 days history of shortness of breath (Sunday), patient has been increasingly short of breath, is been affecting his sleep, has been affecting with little activity he does. Patient to walk 10-20 feet without having some severe leg pain do to severe peripheral vascular disease. Patient has used his albuterol inhalers with little improvement. He's had a productive cough with yellow sputum, denies any chest pressure. He does have a sharp chest pain when he coughs, it starts in the lower abdomen and radiates up through the chest. Patient up until 3 days ago still continued to smoke 2 packs per day, he admits to hot and cold flashes and night sweats recently. Patient does have some soft stools recently and some nausea that comes and goes which is now associated with the cough.  Patient's cardiologist is Dr. Antoine Poche  Past Medical History  Diagnosis Date  . Diabetes mellitus     type 2  . GERD (gastroesophageal reflux disease)     with esophagitis  . PVD (peripheral vascular disease)     bilateral common iliac artery aneurysms. Right SFA occlusion over a long segment. Left SFA disease with occlusion of the left TP trunk. Artirogram Oct. 2006  . History of colonic polyps   . Diverticulosis of colon   . Sleep apnea   . Non-ischemic cardiomyopathy   . Coronary artery disease      LAD a 60-70% stenosis, moderate size second diagonal with 50% stenosis, circumflex AV groove 75-80% stenosis with a branching obtuse marginal with a superior branch subtotal stenosis followed by diffuse disease, right coronary artery  had nonobstructive disease.  EF was 35%  .  COPD (chronic obstructive pulmonary disease)   . Tobacco abuse   . Urinary incontinence     detrussor instability    Past Surgical History  Procedure Date  . S/p icd placement      Medtronic Maximo [phone] single chamber  . Colonoscopy 04/2005  . Cardiac catheterization 06/2004    negative  . Pacemaker insertion 11/2005  . Copd exacerbation     Family History  Problem Relation Age of Onset  . Stroke Father   . Coronary artery disease Maternal Aunt   . Heart failure Maternal Aunt   . Lung cancer Maternal Aunt   . Lung cancer Maternal Uncle   . Cancer Brother     Mouth  . Cancer Sister     throat    History  Substance Use Topics  . Smoking status: Current Every Day Smoker -- 2.0 packs/day for 40 years    Types: Cigarettes  . Smokeless tobacco: Never Used     Comment: GAVE 1-800-QUIT-NOW  . Alcohol Use: No      Review of Systems At least 10pt or greater review of systems completed and are negative except where specified in the HPI.  Allergies  Review of patient's allergies indicates no known allergies.  Home Medications   Current Outpatient Rx  Name  Route  Sig  Dispense  Refill  . ALBUTEROL 90 MCG/ACT IN AERS   Inhalation   Inhale 2 puffs into the lungs 4 (four) times daily as needed.           Marland Kitchen  ASPIRIN 81 MG PO TABS   Oral   Take 81 mg by mouth daily.           Marland Kitchen BISOPROLOL FUMARATE 10 MG PO TABS   Oral   Take 1 tablet (10 mg total) by mouth daily.   30 tablet   6   . CLOPIDOGREL BISULFATE 75 MG PO TABS   Oral   Take 1 tablet (75 mg total) by mouth daily.   90 tablet   3   . DIGOXIN 0.125 MG PO TABS   Oral   Take 1 tablet (125 mcg total) by mouth daily.   30 tablet   6   . ENALAPRIL MALEATE 20 MG PO TABS   Oral   Take 1 tablet (20 mg total) by mouth daily.   30 tablet   11   . GEMFIBROZIL 600 MG PO TABS   Oral   Take 1 tablet (600 mg total) by mouth 2 (two) times daily before a meal.   60 tablet   11   . ISOSORBIDE MONONITRATE  30 MG PO TB24   Oral   Take 30 mg by mouth every morning.         . ISOSORBIDE MONONITRATE 20 MG PO TABS   Oral   Take 1 tablet (20 mg total) by mouth 2 (two) times daily.   60 tablet   5   . MULTIVITAMINS PO TABS   Oral   Take 1 tablet by mouth daily.           Marland Kitchen PRAVASTATIN SODIUM 40 MG PO TABS   Oral   Take 1 tablet (40 mg total) by mouth at bedtime.   90 tablet   3   . VITAMIN B-12 1000 MCG PO TABS   Oral   Take 1,000 mcg by mouth daily.             BP 128/64  Pulse 85  Temp 99.4 F (37.4 C) (Oral)  Resp 20  SpO2 90%  Physical Exam  Nursing notes reviewed.  Electronic medical record reviewed. VITAL SIGNS:   Filed Vitals:   12/15/12 1935 12/15/12 2245 12/15/12 2347 12/15/12 2358  BP: 128/64 112/92 139/73   Pulse: 85 77 53 83  Temp: 99.4 F (37.4 C)  98.3 F (36.8 C)   TempSrc: Oral  Oral   Resp: 20 22 32 20  Height:   5\' 10"  (1.778 m)   Weight:   262 lb 5.6 oz (119 kg)   SpO2: 90% 96% 93% 93%   CONSTITUTIONAL: Awake, oriented, appears non-toxic HENT: Atraumatic, normocephalic, oral mucosa pink and moist, airway patent. Nares patent without drainage. External ears normal. EYES: Conjunctiva clear, EOMI, PERRLA NECK: Trachea midline, non-tender, supple CARDIOVASCULAR: Normal heart rate, Normal rhythm, No murmurs, rubs, gallops PULMONARY/CHEST: Decreased breath sounds bilaterally, wheezing throughout, poor air movement. Non-tender. ABDOMINAL: Non-distended, soft, non-tender - no rebound or guarding.  BS normal. NEUROLOGIC: Non-focal, moving all four extremities, no gross sensory or motor deficits. EXTREMITIES: No clubbing, cyanosis, or edema SKIN: Warm, Dry, No erythema, No rash  ED Course  Procedures (including critical care time)  Date: 12/16/2012  Rate: 102  Rhythm: Sinus tachycardia  QRS Axis: normal  Intervals: normal  ST/T Wave abnormalities: Flipped T waves in the inferior and lateral leads.  Conduction Disutrbances: none  Narrative  Interpretation: Possible demand ischemia in the low lateral and inferior leads when compared with prior EKG dated 04/16/2012  Labs Reviewed  COMPREHENSIVE METABOLIC PANEL - Abnormal;  Notable for the following:    Sodium 134 (*)     Glucose, Bld 109 (*)     AST 39 (*)     All other components within normal limits  CBC WITH DIFFERENTIAL - Abnormal; Notable for the following:    MCHC 36.7 (*)     Monocytes Absolute 1.1 (*)     All other components within normal limits  GLUCOSE, CAPILLARY - Abnormal; Notable for the following:    Glucose-Capillary 226 (*)     All other components within normal limits  PRO B NATRIURETIC PEPTIDE  POCT I-STAT TROPONIN I  TROPONIN I  BASIC METABOLIC PANEL  CBC  TROPONIN I  TROPONIN I  HEMOGLOBIN A1C   Dg Chest 2 View  12/15/2012  *RADIOLOGY REPORT*  Clinical Data: Shortness of breath, cough, chest pain, weakness  CHEST - 2 VIEW  Comparison: 08/08/2011  Findings: Left subclavian AICD lead tip projects over right ventricle. Upper normal heart size. Mediastinal contours and pulmonary vascularity normal. Bronchitic changes with flattening of the diaphragms question COPD. No acute infiltrate, pleural effusion or pneumothorax. No acute osseous findings. Endplate spur formation lower thoracic spine.  IMPRESSION: Question COPD. No acute abnormalities.   Original Report Authenticated By: Ulyses Southward, M.D.     1. COPD exacerbation   2. EKG abnormalities   3. SOB (shortness of breath)   4. Fever   5. CAD (coronary artery disease)   6. Chronic systolic heart failure   7. Other primary cardiomyopathies   8. Type II or unspecified type diabetes mellitus with peripheral circulatory disorders, not stated as uncontrolled(250.70)   9. Other and unspecified hyperlipidemia   10. Tobacco use disorder       Medications  enoxaparin (LOVENOX) injection 40 mg (not administered)  0.9 %  sodium chloride infusion (  Intravenous New Bag/Given 12/15/12 2258)  acetaminophen  (TYLENOL) tablet 650 mg (not administered)    Or  acetaminophen (TYLENOL) suppository 650 mg (not administered)  oxyCODONE (Oxy IR/ROXICODONE) immediate release tablet 5 mg (not administered)  HYDROmorphone (DILAUDID) injection 0.5-1 mg (not administered)  zolpidem (AMBIEN) tablet 5 mg (not administered)  ondansetron (ZOFRAN) tablet 4 mg (not administered)    Or  ondansetron (ZOFRAN) injection 4 mg (not administered)  alum & mag hydroxide-simeth (MAALOX/MYLANTA) 200-200-20 MG/5ML suspension 30 mL (not administered)  aspirin EC tablet 325 mg (not administered)  albuterol (PROVENTIL) (5 MG/ML) 0.5% nebulizer solution 2.5 mg (not administered)  azithromycin (ZITHROMAX) 500 mg in dextrose 5 % 250 mL IVPB (500 mg Intravenous New Bag/Given 12/15/12 2302)  methylPREDNISolone sodium succinate (SOLU-MEDROL) 125 mg/2 mL injection 125 mg (not administered)  methylPREDNISolone sodium succinate (SOLU-MEDROL) 125 mg/2 mL injection 80 mg (not administered)  insulin aspart (novoLOG) injection 0-20 Units (7 Units Subcutaneous Given 12/16/12 0026)  oseltamivir (TAMIFLU) capsule 75 mg (75 mg Oral Given 12/16/12 0119)  albuterol (PROVENTIL) (5 MG/ML) 0.5% nebulizer solution 2.5 mg (not administered)  ipratropium (ATROVENT) nebulizer solution 0.5 mg (not administered)  albuterol (PROVENTIL) (5 MG/ML) 0.5% nebulizer solution 5 mg (5 mg Nebulization Given 12/15/12 1944)  methylPREDNISolone sodium succinate (SOLU-MEDROL) 125 mg/2 mL injection 125 mg (125 mg Intravenous Given 12/15/12 2043)  HYDROcodone-acetaminophen (NORCO/VICODIN) 5-325 MG per tablet 1 tablet (1 tablet Oral Given 12/15/12 2043)  acetaminophen (TYLENOL) tablet 650 mg (650 mg Oral Given 12/15/12 2043)    MDM  [name] is a 56 y.o. male resenting with likely severe COPD exacerbation, patient has triple-vessel coronary artery disease but is considered non-operative candidate, patient  also has severe peripheral vascular disease and is also  non-operative for that issue as well.  Patient likely having some demand ischemia as evidenced by EKG. Given the patient some albuterol inhaler as well as Solu-Medrol.   Chest x-ray shows COPD with flattened diaphragms but no focal infiltrate. Labs are unremarkable, troponin is within normal limits. No white count. No CHF.  Patient saturating about 90% on room air, patient does not have home oxygen. Admitted the patient to the hospitalist service - discussed with Dr. Lovell Sheehan. Cardiology is happy to follow along.  Patient stable and admitted          Jones Skene, MD 12/16/12 [phone]

## 2012-12-15 NOTE — ED Notes (Addendum)
Pt states that he has been SOB since Sunday. Pt states that on Sunday he was able to sleep, but over the last couple of nights he started becoming really winded with little activity, he was SOB when laying flat. Pt states that he has used his nebulizers four times today and is still wheezing, productive coughing and SOB. Pt also states CP that starts in his stomach and moves up to his chest, increase with movement. Pt denies increased swelling in legs.

## 2012-12-15 NOTE — ED Notes (Signed)
Hospitalist at bedside 

## 2012-12-15 NOTE — H&P (Signed)
[facility] History and Physical  [name] ZOX:[id number] DOB: Apr 18, 1956 DOA: 12/15/2012  Referring physician:  PCP: Tillman Abide, MD  Specialists:   Chief Complaint: SOB  HPI: [name] is a 56 y.o. male with Multiple Medical Problems who presents with complaints of worsening SOB and chest tightness X 3 days.  He has also had fevers chills sweats and myalgias and nausea and vomiting for the past 48 hours.   He reports coughing up light greenish sputum.   He was referred for medical admission.       Review of Systems: The patient denies anorexia, fever, weight loss,, vision loss, decreased hearing, hoarseness, chest pain, syncope, dyspnea on exertion, peripheral edema, balance deficits, hemoptysis, abdominal pain, melena, hematochezia, severe indigestion/heartburn, hematuria, incontinence, genital sores, muscle weakness, suspicious skin lesions, transient blindness, difficulty walking, depression, unusual weight change, abnormal bleeding, enlarged lymph nodes, angioedema, and breast masses.    Past Medical History  Diagnosis Date  . Diabetes mellitus     type 2  . GERD (gastroesophageal reflux disease)     with esophagitis  . PVD (peripheral vascular disease)     bilateral common iliac artery aneurysms. Right SFA occlusion over a long segment. Left SFA disease with occlusion of the left TP trunk. Artirogram Oct. 2006  . History of colonic polyps   . Diverticulosis of colon   . Sleep apnea   . Non-ischemic cardiomyopathy   . Coronary artery disease      LAD a 60-70% stenosis, moderate size second diagonal with 50% stenosis, circumflex AV groove 75-80% stenosis with a branching obtuse marginal with a superior branch subtotal stenosis followed by diffuse disease, right coronary artery  had nonobstructive disease.  EF was 35%  . COPD (chronic obstructive pulmonary disease)   . Tobacco abuse   . Urinary incontinence     detrussor instability   Past Surgical History   Procedure Date  . S/p icd placement      Medtronic Maximo [phone] single chamber  . Colonoscopy 04/2005  . Cardiac catheterization 06/2004    negative  . Pacemaker insertion 11/2005  . Copd exacerbation      Medications:  HOME MEDS: Prior to Admission medications   Medication Sig Start Date End Date Taking? Authorizing Provider  albuterol (PROVENTIL,VENTOLIN) 90 MCG/ACT inhaler Inhale 2 puffs into the lungs 4 (four) times daily as needed. For shortness of breath   Yes Historical Provider, MD  aspirin 81 MG tablet Take 81 mg by mouth daily.     Yes Historical Provider, MD  bisoprolol (ZEBETA) 10 MG tablet Take 1 tablet (10 mg total) by mouth daily. 04/28/12  Yes Rollene Rotunda, MD  clopidogrel (PLAVIX) 75 MG tablet Take 1 tablet (75 mg total) by mouth daily. 11/03/12  Yes Rollene Rotunda, MD  enalapril (VASOTEC) 20 MG tablet Take 1 tablet (20 mg total) by mouth daily. 01/16/12 01/15/13 Yes Rollene Rotunda, MD  gemfibrozil (LOPID) 600 MG tablet Take 1 tablet (600 mg total) by mouth 2 (two) times daily before a meal. 04/06/12  Yes Karie Schwalbe, MD  isosorbide mononitrate (ISMO,MONOKET) 20 MG tablet Take 1 tablet (20 mg total) by mouth 2 (two) times daily. 11/17/12  Yes Rollene Rotunda, MD  multivitamin [facility]) per tablet Take 1 tablet by mouth daily.     Yes Historical Provider, MD  pravastatin (PRAVACHOL) 40 MG tablet Take 1 tablet (40 mg total) by mouth at bedtime. 11/03/12  Yes Rollene Rotunda, MD  vitamin B-12 (CYANOCOBALAMIN) 1000 MCG  tablet Take 1,000 mcg by mouth daily.     Yes Historical Provider, MD  isosorbide mononitrate (IMDUR) 30 MG CR tablet Take 30 mg by mouth every morning.  10/26/12  Rollene Rotunda, MD    Allergies:  No Known Allergies  Social History:   reports that he has been smoking Cigarettes.  He has a 80 pack-year smoking history. He has never used smokeless tobacco. He reports that he does not drink alcohol or use illicit drugs.  Family History: Family History   Problem Relation Age of Onset  . Stroke Father   . Coronary artery disease Maternal Aunt   . Heart failure Maternal Aunt   . Lung cancer Maternal Aunt   . Lung cancer Maternal Uncle   . Cancer Brother     Mouth  . Cancer Sister     throat     Physical Exam:  GEN:  Pleasant 56 year old Obese Caucasian male examined  and in no acute distress; cooperative with exam Filed Vitals:   12/15/12 1935  BP: 128/64  Pulse: 85  Temp: 99.4 F (37.4 C)  TempSrc: Oral  Resp: 20  SpO2: 90%   Blood pressure 128/64, pulse 85, temperature 99.4 F (37.4 C), temperature source Oral, resp. rate 20, SpO2 90.00%. PSYCH: He is alert and oriented x4; does not appear anxious does not appear depressed; affect is normal HEENT: Normocephalic and Atraumatic, Mucous membranes pink; PERRLA; EOM intact; Fundi:  Benign;  No scleral icterus, Nares: Patent, Oropharynx: Clear, Fair Dentition, Neck:  FROM, no cervical lymphadenopathy nor thyromegaly or carotid bruit; no JVD; Breasts:: Not examined CHEST WALL: No tenderness CHEST: Decreased Breath Sounds,  No rales rhonchi or wheezes HEART: Regular rate and rhythm; no murmurs rubs or gallops BACK: No kyphosis or scoliosis; no CVA tenderness ABDOMEN: Positive Bowel Sounds, Obese, soft non-tender; no masses, no organomegaly. Rectal Exam: Not done EXTREMITIES: No bone or joint deformity; age-appropriate arthropathy of the hands and knees; no cyanosis, clubbing or edema; no ulcerations. Genitalia: not examined PULSES: 2+ and symmetric SKIN: Normal hydration no rash or ulceration CNS: Cranial nerves 2-12 grossly intact no focal neurologic deficit    Labs on Admission:  Basic Metabolic Panel:  Lab 12/15/12 2952  NA 134*  K 4.5  CL 97  CO2 23  GLUCOSE 109*  BUN 13  CREATININE 0.73  CALCIUM 9.7  MG --  PHOS --   Liver Function Tests:  Lab 12/15/12 1938  AST 39*  ALT 44  ALKPHOS 96  BILITOT 0.5  PROT 7.8  ALBUMIN 4.1   No results found for this  basename: LIPASE:5,AMYLASE:5 in the last 168 hours No results found for this basename: AMMONIA:5 in the last 168 hours CBC:  Lab 12/15/12 1938  WBC 9.0  NEUTROABS 6.3  HGB 15.4  HCT 42.0  MCV 80.6  PLT 213   Cardiac Enzymes: No results found for this basename: CKTOTAL:5,CKMB:5,CKMBINDEX:5,TROPONINI:5 in the last 168 hours  BNP (last 3 results)  Basename 12/15/12 1949  PROBNP 48.9   CBG: No results found for this basename: GLUCAP:5 in the last 168 hours  Radiological Exams on Admission: Dg Chest 2 View  12/15/2012  *RADIOLOGY REPORT*  Clinical Data: Shortness of breath, cough, chest pain, weakness  CHEST - 2 VIEW  Comparison: 08/08/2011  Findings: Left subclavian AICD lead tip projects over right ventricle. Upper normal heart size. Mediastinal contours and pulmonary vascularity normal. Bronchitic changes with flattening of the diaphragms question COPD. No acute infiltrate, pleural effusion or pneumothorax.  No acute osseous findings. Endplate spur formation lower thoracic spine.  IMPRESSION: Question COPD. No acute abnormalities.   Original Report Authenticated By: Ulyses Southward, M.D.     EKG: Independently reviewed. Sinus tachycardia at 102,  S-T depression in inferior leads  Assessment: Principal Problem:  *COPD exacerbation Active Problems:  Type II or unspecified type diabetes mellitus with peripheral circulatory disorders, not stated as uncontrolled(250.70)  DYSLIPIDEMIA  TOBACCO ABUSE  NEUROPATHY  CARDIOMYOPATHY  Chronic systolic heart failure  CAD (coronary artery disease)  SOB (shortness of breath)  Fever    Plan:       Admit to Telemetry Bed Cycle Cardiac Enzymes IV Steroid Taper Nebs, O2 Azithromycin Tamiflu Droplet Precautions Reconcile Home Medications DVT prophylaxis   Code Status:  FULL CODE Family Communication:  Wife at Bedside Disposition Plan:  Return to Home on Discharge  Time spent: 4 Minutes  Ron Parker [facility] Pager [phone]  If 7PM-7AM, please contact night-coverage [email or web address] Password [facility] 12/15/2012, 10:31 PM

## 2012-12-16 DIAGNOSIS — J441 Chronic obstructive pulmonary disease with (acute) exacerbation: Secondary | ICD-10-CM | POA: Diagnosis not present

## 2012-12-16 DIAGNOSIS — J218 Acute bronchiolitis due to other specified organisms: Secondary | ICD-10-CM

## 2012-12-16 LAB — GLUCOSE, CAPILLARY
Glucose-Capillary: 225 mg/dL — ABNORMAL HIGH (ref 70–99)
Glucose-Capillary: 153 mg/dL — ABNORMAL HIGH (ref 70–99)
Glucose-Capillary: 151 mg/dL — ABNORMAL HIGH (ref 70–99)
Glucose-Capillary: 272 mg/dL — ABNORMAL HIGH (ref 70–99)

## 2012-12-16 LAB — BASIC METABOLIC PANEL
Calcium: 9.4 mg/dL (ref 8.4–10.5)
Creatinine, Ser: 0.68 mg/dL (ref 0.50–1.35)
GFR calc Af Amer: >90 mL/min (ref >90)
GFR calc non Af Amer: >90 mL/min (ref >90)
Sodium: 132 mEq/L — ABNORMAL LOW (ref 135–145)

## 2012-12-16 LAB — TROPONIN I: Troponin I: <0.3 ng/mL (ref <0.30)

## 2012-12-16 LAB — HEMOGLOBIN A1C: Hgb A1c MFr Bld: 5.9 % — ABNORMAL HIGH (ref <5.7)

## 2012-12-16 LAB — CBC
Platelets: 208 10*3/uL (ref 150–400)
RBC: 5.06 MIL/uL (ref 4.22–5.81)
RDW: 13 % (ref 11.5–15.5)
WBC: 5.5 10*3/uL (ref 4.0–10.5)

## 2012-12-16 LAB — INFLUENZA PANEL BY PCR (TYPE A & B)
H1N1 flu by pcr: DETECTED — AB
Influenza B By PCR: NEGATIVE

## 2012-12-16 MED ORDER — ALBUTEROL SULFATE (5 MG/ML) 0.5% IN NEBU
2.5000 mg | INHALATION_SOLUTION | Freq: Four times a day (QID) | RESPIRATORY_TRACT | Status: DC
  Administered 2012-12-16 – 2012-12-18 (×9): 2.5 mg via RESPIRATORY_TRACT
  Filled 2012-12-16 (×8): qty 0.5

## 2012-12-16 MED ORDER — ENALAPRIL MALEATE 20 MG PO TABS
20.0000 mg | ORAL_TABLET | Freq: Every day | ORAL | Status: DC
  Administered 2012-12-16 – 2012-12-17 (×2): 20 mg via ORAL
  Filled 2012-12-16 (×3): qty 1

## 2012-12-16 MED ORDER — MULTIVITAMINS PO TABS: 1.0000 | ORAL_TABLET | Freq: Every day | ORAL | Status: DC

## 2012-12-16 MED ORDER — GEMFIBROZIL 600 MG PO TABS
600.0000 mg | ORAL_TABLET | Freq: Two times a day (BID) | ORAL | Status: DC
  Administered 2012-12-16 – 2012-12-18 (×4): 600 mg via ORAL
  Filled 2012-12-16 (×6): qty 1

## 2012-12-16 MED ORDER — LORATADINE 10 MG PO TABS
10.0000 mg | ORAL_TABLET | Freq: Every day | ORAL | Status: DC
  Administered 2012-12-16 – 2012-12-17 (×2): 10 mg via ORAL
  Filled 2012-12-16 (×3): qty 1

## 2012-12-16 MED ORDER — IPRATROPIUM BROMIDE 0.02 % IN SOLN
0.5000 mg | Freq: Four times a day (QID) | RESPIRATORY_TRACT | Status: DC
  Administered 2012-12-16 – 2012-12-17 (×7): 0.5 mg via RESPIRATORY_TRACT
  Filled 2012-12-16 (×6): qty 2.5

## 2012-12-16 MED ORDER — LEVOFLOXACIN IN D5W 750 MG/150ML IV SOLN
750.0000 mg | INTRAVENOUS | Status: DC
  Administered 2012-12-16 – 2012-12-17 (×2): 750 mg via INTRAVENOUS
  Filled 2012-12-16 (×3): qty 150

## 2012-12-16 MED ORDER — FLUTICASONE PROPIONATE 50 MCG/ACT NA SUSP
1.0000 | Freq: Every day | NASAL | Status: DC
  Administered 2012-12-16 – 2012-12-17 (×2): 1 via NASAL
  Filled 2012-12-16: qty 16

## 2012-12-16 MED ORDER — PANTOPRAZOLE SODIUM 40 MG PO TBEC
40.0000 mg | DELAYED_RELEASE_TABLET | Freq: Every day | ORAL | Status: DC
  Administered 2012-12-16 – 2012-12-17 (×2): 40 mg via ORAL
  Filled 2012-12-16 (×2): qty 1

## 2012-12-16 MED ORDER — VITAMIN B-12 1000 MCG PO TABS
1000.0000 ug | ORAL_TABLET | Freq: Every day | ORAL | Status: DC
  Administered 2012-12-16 – 2012-12-17 (×2): 1000 ug via ORAL
  Filled 2012-12-16 (×3): qty 1

## 2012-12-16 MED ORDER — METHYLPREDNISOLONE SODIUM SUCC 125 MG IJ SOLR
80.0000 mg | Freq: Three times a day (TID) | INTRAMUSCULAR | Status: DC
  Administered 2012-12-16 – 2012-12-17 (×4): 80 mg via INTRAVENOUS
  Filled 2012-12-16 (×6): qty 1.28

## 2012-12-16 MED ORDER — ASPIRIN EC 81 MG PO TBEC
81.0000 mg | DELAYED_RELEASE_TABLET | Freq: Every day | ORAL | Status: DC
  Administered 2012-12-16 – 2012-12-17 (×2): 81 mg via ORAL
  Filled 2012-12-16 (×3): qty 1

## 2012-12-16 MED ORDER — ISOSORBIDE MONONITRATE 20 MG PO TABS
20.0000 mg | ORAL_TABLET | Freq: Two times a day (BID) | ORAL | Status: DC
  Administered 2012-12-16 – 2012-12-17 (×4): 20 mg via ORAL
  Filled 2012-12-16 (×6): qty 1

## 2012-12-16 MED ORDER — CLOPIDOGREL BISULFATE 75 MG PO TABS
75.0000 mg | ORAL_TABLET | Freq: Every day | ORAL | Status: DC
  Administered 2012-12-16 – 2012-12-17 (×2): 75 mg via ORAL
  Filled 2012-12-16 (×3): qty 1

## 2012-12-16 MED ORDER — OXYMETAZOLINE HCL 0.05 % NA SOLN
1.0000 | Freq: Two times a day (BID) | NASAL | Status: DC
  Administered 2012-12-16 – 2012-12-17 (×4): 1 via NASAL
  Filled 2012-12-16: qty 15

## 2012-12-16 MED ORDER — SIMVASTATIN 20 MG PO TABS
20.0000 mg | ORAL_TABLET | Freq: Every day | ORAL | Status: DC
  Administered 2012-12-16: 20 mg via ORAL
  Filled 2012-12-16 (×2): qty 1

## 2012-12-16 MED ORDER — ADULT MULTIVITAMIN W/MINERALS CH
1.0000 | ORAL_TABLET | Freq: Every day | ORAL | Status: DC
  Administered 2012-12-16 – 2012-12-17 (×2): 1 via ORAL
  Filled 2012-12-16 (×3): qty 1

## 2012-12-16 NOTE — Progress Notes (Signed)
TEAGHAN FORMICA [id number] Admitted to 5532: 12/16/2012  Attending Provider: Ron Parker, MD    [name] is a 56 y.o. male patient admitted from ED awake, alert  & orientated  X 3,  Full Code, VSS - Blood pressure 139/73, pulse 83, temperature 98.3 F (36.8 C), temperature source Oral, resp. rate 20, SpO2 93.00%., O2    2L nasal cannular, shortness of breath with exertion, no c/o chest pain, no distress noted. Tele # [id number] placed and pt is currently running: 1st degree Heart block   IV site WDL:  forearm right, condition patent and no redness with a transparent dsg that's clean dry and intact.  Allergies:  No Known Allergies   Past Medical History  Diagnosis Date  . Diabetes mellitus     type 2  . GERD (gastroesophageal reflux disease)     with esophagitis  . PVD (peripheral vascular disease)     bilateral common iliac artery aneurysms. Right SFA occlusion over a long segment. Left SFA disease with occlusion of the left TP trunk. Artirogram Oct. 2006  . History of colonic polyps   . Diverticulosis of colon   . Sleep apnea   . Non-ischemic cardiomyopathy   . Coronary artery disease      LAD a 60-70% stenosis, moderate size second diagonal with 50% stenosis, circumflex AV groove 75-80% stenosis with a branching obtuse marginal with a superior branch subtotal stenosis followed by diffuse disease, right coronary artery  had nonobstructive disease.  EF was 35%  . COPD (chronic obstructive pulmonary disease)   . Tobacco abuse   . Urinary incontinence     detrussor instability    History:  obtained from the patient.  Pt orientation to unit, room and routine. Information packet given to patient/family and safety video watched.  Admission INP armband ID verified with patient/family, and in place. SR up x 2, fall risk assessment complete with Patient and family verbalizing understanding of risks associated with falls. Pt verbalizes an understanding of how to use the call bell and to  call for help before getting out of bed.  Skin, clean-dry- intact without evidence of bruising, or skin tears.   No evidence of skin break down noted on exam.    Will cont to monitor and assist as needed.  Julien Nordmann [facility], RN 12/16/2012 12:12 AM

## 2012-12-16 NOTE — Progress Notes (Signed)
PATIENT DETAILS Name: [name] Age: 56 y.o. Sex: male Date of Birth: 10-26-1956 Admit Date: 12/15/2012 Admitting Physician Ron Parker, MD [id number] [name], MD  Subjective: Feels better this am Admitted with cough, nasal congestion, fever, headaches, myalgias and SOB since last Sunday  Assessment/Plan: Principal Problem:  *COPD exacerbation -still with wheezing -c/w Solumedrol, Nebulized Bronchodilators, empiric Levaquin  Active Problems: Fever/Cough/Myalgias -suspicion for Influenza vs Bronchitis -also complaining of significant nasal congestion -c/w Tamiiflu for now, get a Influenza PCR swab -add Flonase, Afrin nasal spray. Add Anti-histamine -now on Levaquin -droplet precautions till Influenza PCR negative  DM -CBG's moderately controlled -continue with SSI -last A1C-10/26/12-6.2  HTN -resume Enalapril and Imdur -once Bronchospasm better-resume Bisoprolol  CAD -known CAD-on medical management.Per outpatient cards note-not a candidate for intervention -resume ASA, Plavix and Statins -resume Beta blockers when bronchospasm better -currently with no chest pain   DYSLIPIDEMIA -continue with Lopid and Zocor -last Lipid panel-11/4-TG's 305 and LDL-20   TOBACCO ABUSE -counseled extensively  PVD -resume ASA,Plavix and Statins -further follow up as outpatient   CARDIOMYOPATHY-s/p ICD placement -EF per [facility] on 10/03/11-35-40 % with global hypokinesis -clinically compensated -resume ACEI, resume Bisoprolol when bronchospasm better  Disposition: Remain inpatient  DVT Prophylaxis: Prophylactic Lovenox  Code Status: Full code   Procedures:  None  CONSULTS:  None  PHYSICAL EXAM: Vital signs in last 24 hours: Filed Vitals:   12/15/12 2347 12/15/12 2358 12/16/12 0450 12/16/12 0813  BP: 139/73  133/81   Pulse: 53 83 64 68  Temp: 98.3 F (36.8 C)  98.2 F (36.8 C)   TempSrc: Oral  Oral   Resp: 32 20 22 21   Height: 5\' 10"  (1.778 m)      Weight: 119 kg (262 lb 5.6 oz)     SpO2: 93% 93% 93% 93%    Weight change:  Body mass index is 37.64 kg/(m^2).   Gen Exam: Awake and alert with clear speech.   Neck: Supple, No JVD.   Chest: B/L air entry +, wheezing in almost all lung zones CVS: S1 S2 Regular, no murmurs.  Abdomen: soft, BS +, non tender, non distended.  Extremities: no edema, lower extremities warm to touch. Neurologic: Non Focal.   Skin: No Rash.  Wounds: N/A.    Intake/Output from previous day:  Intake/Output Summary (Last 24 hours) at 12/16/12 0926 Last data filed at 12/16/12 0525  Gross per 24 hour  Intake  812.5 ml  Output      0 ml  Net  812.5 ml     LAB RESULTS: CBC  Lab 12/16/12 0535 12/15/12 1938  WBC 5.5 9.0  HGB 14.6 15.4  HCT 40.7 42.0  PLT 208 213  MCV 80.4 80.6  MCH 28.9 29.6  MCHC 35.9 36.7*  RDW 13.0 13.0  LYMPHSABS -- 1.6  MONOABS -- 1.1*  EOSABS -- 0.0  BASOSABS -- 0.0  BANDABS -- --    Chemistries   Lab 12/16/12 0535 12/15/12 1938  NA 132* 134*  K 4.6 4.5  CL 94* 97  CO2 25 23  GLUCOSE 165* 109*  BUN 13 13  CREATININE 0.68 0.73  CALCIUM 9.4 9.7  MG -- --    CBG:  Lab 12/16/12 0753 12/16/12 0452 12/15/12 2355  GLUCAP 138* 225* 226*    GFR Estimated Creatinine Clearance: 133.3 ml/min (by C-G formula based on Cr of 0.68).  Coagulation profile No results found for this basename: INR:5,PROTIME:5 in the last 168 hours  Cardiac Enzymes  Lab  12/16/12 0535 12/15/12 2231  CKMB -- --  TROPONINI <0.30 <0.30  MYOGLOBIN -- --    No components found with this basename: POCBNP:3 No results found for this basename: DDIMER:2 in the last 72 hours No results found for this basename: HGBA1C:2 in the last 72 hours No results found for this basename: CHOL:2,HDL:2,LDLCALC:2,TRIG:2,CHOLHDL:2,LDLDIRECT:2 in the last 72 hours No results found for this basename: TSH,T4TOTAL,FREET3,T3FREE,THYROIDAB in the last 72 hours No results found for this basename:  VITAMINB12:2,FOLATE:2,FERRITIN:2,TIBC:2,IRON:2,RETICCTPCT:2 in the last 72 hours No results found for this basename: LIPASE:2,AMYLASE:2 in the last 72 hours  Urine Studies No results found for this basename: UACOL:2,UAPR:2,USPG:2,UPH:2,UTP:2,UGL:2,UKET:2,UBIL:2,UHGB:2,UNIT:2,UROB:2,ULEU:2,UEPI:2,UWBC:2,URBC:2,UBAC:2,CAST:2,CRYS:2,UCOM:2,BILUA:2 in the last 72 hours  MICROBIOLOGY: No results found for this or any previous visit (from the past 240 hour(s)).  RADIOLOGY STUDIES/RESULTS: Dg Chest 2 View  12/15/2012  *RADIOLOGY REPORT*  Clinical Data: Shortness of breath, cough, chest pain, weakness  CHEST - 2 VIEW  Comparison: 08/08/2011  Findings: Left subclavian AICD lead tip projects over right ventricle. Upper normal heart size. Mediastinal contours and pulmonary vascularity normal. Bronchitic changes with flattening of the diaphragms question COPD. No acute infiltrate, pleural effusion or pneumothorax. No acute osseous findings. Endplate spur formation lower thoracic spine.  IMPRESSION: Question COPD. No acute abnormalities.   Original Report Authenticated By: Ulyses Southward, M.D.     MEDICATIONS: Scheduled Meds:   . albuterol  2.5 mg Nebulization QID  . aspirin EC  81 mg Oral Daily  . clopidogrel  75 mg Oral Daily  . enalapril  20 mg Oral Daily  . enoxaparin (LOVENOX) injection  40 mg Subcutaneous Q24H  . fluticasone  1 spray Each Nare Daily  . gemfibrozil  600 mg Oral BID AC  . insulin aspart  0-20 Units Subcutaneous Q4H  . ipratropium  0.5 mg Nebulization QID  . isosorbide mononitrate  20 mg Oral BID  . levofloxacin (LEVAQUIN) IV  750 mg Intravenous Q24H  . loratadine  10 mg Oral Daily  . methylPREDNISolone (SOLU-MEDROL) injection  80 mg Intravenous TID  . multivitamin with minerals  1 tablet Oral Daily  . oseltamivir  75 mg Oral BID  . oxymetazoline  1 spray Each Nare BID  . pantoprazole  40 mg Oral Q1200  . simvastatin  20 mg Oral q1800  . vitamin B-12  1,000 mcg Oral Daily    Continuous Infusions:  PRN Meds:.acetaminophen, acetaminophen, albuterol, alum & mag hydroxide-simeth, HYDROmorphone (DILAUDID) injection, ondansetron (ZOFRAN) IV, ondansetron, oxyCODONE, zolpidem  Antibiotics: Anti-infectives     Start     Dose/Rate Route Frequency Ordered Stop   12/16/12 1000   levofloxacin (LEVAQUIN) IVPB 750 mg        750 mg 100 mL/hr over 90 Minutes Intravenous Every 24 hours 12/16/12 0800     12/15/12 2230   azithromycin (ZITHROMAX) 500 mg in dextrose 5 % 250 mL IVPB  Status:  Discontinued        500 mg 250 mL/hr over 60 Minutes Intravenous Every 24 hours 12/15/12 2222 12/16/12 0800   12/15/12 2230   oseltamivir (TAMIFLU) capsule 75 mg        75 mg Oral 2 times daily 12/15/12 2226 12/20/12 2159           Jeoffrey Massed, MD  [facility] Pager:[phone]  If 7PM-7AM, please contact night-coverage [email or web address] Password [email or web address] 12/16/2012, 9:26 AM   LOS: 1 day

## 2012-12-17 DIAGNOSIS — I429 Cardiomyopathy, unspecified: Secondary | ICD-10-CM

## 2012-12-17 DIAGNOSIS — I2510 Atherosclerotic heart disease of native coronary artery without angina pectoris: Secondary | ICD-10-CM

## 2012-12-17 DIAGNOSIS — J441 Chronic obstructive pulmonary disease with (acute) exacerbation: Secondary | ICD-10-CM | POA: Diagnosis not present

## 2012-12-17 LAB — BASIC METABOLIC PANEL
Chloride: 101 mEq/L (ref 96–112)
Creatinine, Ser: 0.76 mg/dL (ref 0.50–1.35)
GFR calc Af Amer: >90 mL/min (ref >90)
GFR calc non Af Amer: >90 mL/min (ref >90)
Potassium: 4.4 mEq/L (ref 3.5–5.1)

## 2012-12-17 LAB — CBC
MCHC: 35 g/dL (ref 30.0–36.0)
MCV: 81.4 fL (ref 78.0–100.0)
Platelets: 246 10*3/uL (ref 150–400)
RDW: 13.2 % (ref 11.5–15.5)
WBC: 9.4 10*3/uL (ref 4.0–10.5)

## 2012-12-17 LAB — GLUCOSE, CAPILLARY
Glucose-Capillary: 158 mg/dL — ABNORMAL HIGH (ref 70–99)
Glucose-Capillary: 113 mg/dL — ABNORMAL HIGH (ref 70–99)
Glucose-Capillary: 182 mg/dL — ABNORMAL HIGH (ref 70–99)
Glucose-Capillary: 162 mg/dL — ABNORMAL HIGH (ref 70–99)
Glucose-Capillary: 224 mg/dL — ABNORMAL HIGH (ref 70–99)

## 2012-12-17 MED ORDER — ATORVASTATIN CALCIUM 10 MG PO TABS
10.0000 mg | ORAL_TABLET | Freq: Every day | ORAL | Status: DC
  Administered 2012-12-18: 10 mg via ORAL
  Filled 2012-12-17 (×3): qty 1

## 2012-12-17 MED ORDER — PREDNISONE 20 MG PO TABS
40.0000 mg | ORAL_TABLET | Freq: Every day | ORAL | Status: DC
  Administered 2012-12-18: 40 mg via ORAL
  Filled 2012-12-17 (×2): qty 2

## 2012-12-17 NOTE — Progress Notes (Signed)
PATIENT DETAILS Name: [name] Age: 56 y.o. Sex: male Date of Birth: 1956-10-05 Admit Date: 12/15/2012 Admitting Physician Ron Parker, MD [id number] [name], MD  Subjective: Feels significantly better this am-anxious to go home  Assessment/Plan: Principal Problem:  *COPD exacerbation -still with wheezing -c/w  Nebulized Bronchodilators, empiric Levaquin - Change Solu-Medrol to prednisone  Active Problems: Fever/Cough/Myalgias - Influenza PCR positive -c/w Tamiiflu for now - Nasal congestion significantly better - Continue with Flonase, Afrin nasal spray. Continue with Anti-histamine -droplet precautions as Influenza PCR positive  DM -CBG's moderately controlled -continue with SSI -last A1C-10/26/12-6.2  HTN -resume Enalapril and Imdur -once Bronchospasm better-resume Bisoprolol-  on discharge  CAD -known CAD-on medical management.Per outpatient cards note-not a candidate for intervention -resume ASA, Plavix and Statins -resume Beta blockers when bronchospasm better- plan on resuming on discharge -currently with no chest pain   DYSLIPIDEMIA -continue with Lopid and Zocor -last Lipid panel-11/4-TG's 305 and LDL-20   TOBACCO ABUSE -counseled extensively  PVD -resume ASA,Plavix and Statins -further follow up as outpatient   CARDIOMYOPATHY-s/p ICD placement -EF per [facility] on 10/03/11-35-40 % with global hypokinesis -clinically compensated -resume ACEI, resume Bisoprolol when bronchospasm better  Disposition: Remain inpatient-home 12/27  DVT Prophylaxis: Prophylactic Lovenox  Code Status: Full code   Procedures:  None  CONSULTS:  None  PHYSICAL EXAM: Vital signs in last 24 hours: Filed Vitals:   12/16/12 2140 12/17/12 0417 12/17/12 1032 12/17/12 1454  BP: 127/68 148/75 111/52 136/64  Pulse: 73 73 68 69  Temp: 97.7 F (36.5 C) 97.7 F (36.5 C) 97.6 F (36.4 C) 97.9 F (36.6 C)  TempSrc: Oral Oral Oral Oral  Resp: 20 20 18 18     Height:      Weight:      SpO2: 94% 92% 92% 93%    Weight change:  Body mass index is 37.64 kg/(m^2).   Gen Exam: Awake and alert with clear speech.   Neck: Supple, No JVD.   Chest: B/L air entry +, wheezing in almost all lung zones CVS: S1 S2 Regular, no murmurs.  Abdomen: soft, BS +, non tender, non distended.  Extremities: no edema, lower extremities warm to touch. Neurologic: Non Focal.   Skin: No Rash.  Wounds: N/A.    Intake/Output from previous day:  Intake/Output Summary (Last 24 hours) at 12/17/12 1528 Last data filed at 12/17/12 1454  Gross per 24 hour  Intake   1120 ml  Output   1900 ml  Net   -780 ml     LAB RESULTS: CBC  Lab 12/17/12 0610 12/16/12 0535 12/15/12 1938  WBC 9.4 5.5 9.0  HGB 13.8 14.6 15.4  HCT 39.4 40.7 42.0  PLT 246 208 213  MCV 81.4 80.4 80.6  MCH 28.5 28.9 29.6  MCHC 35.0 35.9 36.7*  RDW 13.2 13.0 13.0  LYMPHSABS -- -- 1.6  MONOABS -- -- 1.1*  EOSABS -- -- 0.0  BASOSABS -- -- 0.0  BANDABS -- -- --    Chemistries   Lab 12/17/12 0610 12/16/12 0535 12/15/12 1938  NA 138 132* 134*  K 4.4 4.6 4.5  CL 101 94* 97  CO2 25 25 23   GLUCOSE 131* 165* 109*  BUN 19 13 13   CREATININE 0.76 0.68 0.73  CALCIUM 9.1 9.4 9.7  MG -- -- --    CBG:  Lab 12/17/12 1220 12/17/12 0749 12/17/12 0433 12/17/12 0027 12/16/12 1915  GLUCAP 182* 113* 158* 266* 272*    GFR Estimated Creatinine Clearance: 133.3 ml/min (  by C-G formula based on Cr of 0.76).  Coagulation profile No results found for this basename: INR:5,PROTIME:5 in the last 168 hours  Cardiac Enzymes  Lab 12/16/12 1158 12/16/12 0535 12/15/12 2231  CKMB -- -- --  TROPONINI <0.30 <0.30 <0.30  MYOGLOBIN -- -- --    No components found with this basename: POCBNP:3 No results found for this basename: DDIMER:2 in the last 72 hours  Basename 12/15/12 2231  HGBA1C 5.9*   No results found for this basename: CHOL:2,HDL:2,LDLCALC:2,TRIG:2,CHOLHDL:2,LDLDIRECT:2 in the last 72  hours No results found for this basename: TSH,T4TOTAL,FREET3,T3FREE,THYROIDAB in the last 72 hours No results found for this basename: VITAMINB12:2,FOLATE:2,FERRITIN:2,TIBC:2,IRON:2,RETICCTPCT:2 in the last 72 hours No results found for this basename: LIPASE:2,AMYLASE:2 in the last 72 hours  Urine Studies No results found for this basename: UACOL:2,UAPR:2,USPG:2,UPH:2,UTP:2,UGL:2,UKET:2,UBIL:2,UHGB:2,UNIT:2,UROB:2,ULEU:2,UEPI:2,UWBC:2,URBC:2,UBAC:2,CAST:2,CRYS:2,UCOM:2,BILUA:2 in the last 72 hours  MICROBIOLOGY: No results found for this or any previous visit (from the past 240 hour(s)).  RADIOLOGY STUDIES/RESULTS: Dg Chest 2 View  12/15/2012  *RADIOLOGY REPORT*  Clinical Data: Shortness of breath, cough, chest pain, weakness  CHEST - 2 VIEW  Comparison: 08/08/2011  Findings: Left subclavian AICD lead tip projects over right ventricle. Upper normal heart size. Mediastinal contours and pulmonary vascularity normal. Bronchitic changes with flattening of the diaphragms question COPD. No acute infiltrate, pleural effusion or pneumothorax. No acute osseous findings. Endplate spur formation lower thoracic spine.  IMPRESSION: Question COPD. No acute abnormalities.   Original Report Authenticated By: Ulyses Southward, M.D.     MEDICATIONS: Scheduled Meds:    . albuterol  2.5 mg Nebulization QID  . aspirin EC  81 mg Oral Daily  . clopidogrel  75 mg Oral Daily  . enalapril  20 mg Oral Daily  . enoxaparin (LOVENOX) injection  40 mg Subcutaneous Q24H  . fluticasone  1 spray Each Nare Daily  . gemfibrozil  600 mg Oral BID AC  . insulin aspart  0-20 Units Subcutaneous Q4H  . ipratropium  0.5 mg Nebulization QID  . isosorbide mononitrate  20 mg Oral BID  . levofloxacin (LEVAQUIN) IV  750 mg Intravenous Q24H  . loratadine  10 mg Oral Daily  . methylPREDNISolone (SOLU-MEDROL) injection  80 mg Intravenous TID  . multivitamin with minerals  1 tablet Oral Daily  . oseltamivir  75 mg Oral BID  .  oxymetazoline  1 spray Each Nare BID  . pantoprazole  40 mg Oral Q1200  . simvastatin  20 mg Oral q1800  . vitamin B-12  1,000 mcg Oral Daily   Continuous Infusions:  PRN Meds:.acetaminophen, acetaminophen, albuterol, alum & mag hydroxide-simeth, HYDROmorphone (DILAUDID) injection, ondansetron (ZOFRAN) IV, ondansetron, oxyCODONE, zolpidem  Antibiotics: Anti-infectives     Start     Dose/Rate Route Frequency Ordered Stop   12/16/12 1000   levofloxacin (LEVAQUIN) IVPB 750 mg        750 mg 100 mL/hr over 90 Minutes Intravenous Every 24 hours 12/16/12 0800     12/15/12 2230   azithromycin (ZITHROMAX) 500 mg in dextrose 5 % 250 mL IVPB  Status:  Discontinued        500 mg 250 mL/hr over 60 Minutes Intravenous Every 24 hours 12/15/12 2222 12/16/12 0800   12/15/12 2230   oseltamivir (TAMIFLU) capsule 75 mg        75 mg Oral 2 times daily 12/15/12 2226 12/20/12 2159           Jeoffrey Massed, MD  [facility] Pager:[phone]  If 7PM-7AM, please contact night-coverage [email or web address] Password [email or web address]  12/17/2012, 3:28 PM   LOS: 2 days

## 2012-12-17 NOTE — Progress Notes (Signed)
SATURATION QUALIFICATIONS: (This note is used to comply with regulatory documentation for home oxygen)  Patient Saturations on Room Air at Rest = 93%  Patient Saturations on Room Air while Ambulating = 96%  Patient Saturations on 0 Liters of oxygen while Ambulating = 96%  Please briefly explain why patient needs home oxygen: No need for O2 at this time.

## 2012-12-18 DIAGNOSIS — E782 Mixed hyperlipidemia: Secondary | ICD-10-CM

## 2012-12-18 DIAGNOSIS — I5022 Chronic systolic (congestive) heart failure: Secondary | ICD-10-CM

## 2012-12-18 DIAGNOSIS — J441 Chronic obstructive pulmonary disease with (acute) exacerbation: Secondary | ICD-10-CM

## 2012-12-18 DIAGNOSIS — E785 Hyperlipidemia, unspecified: Secondary | ICD-10-CM

## 2012-12-18 LAB — GLUCOSE, CAPILLARY: Glucose-Capillary: 177 mg/dL — ABNORMAL HIGH (ref 70–99)

## 2012-12-18 MED ORDER — PREDNISONE 10 MG PO TABS: ORAL_TABLET | ORAL | Status: DC

## 2012-12-18 MED ORDER — LORATADINE 10 MG PO TABS: 10.0000 mg | ORAL_TABLET | Freq: Every day | ORAL | Status: DC

## 2012-12-18 MED ORDER — OSELTAMIVIR PHOSPHATE 75 MG PO CAPS: 75.0000 mg | ORAL_CAPSULE | Freq: Two times a day (BID) | ORAL | Status: DC

## 2012-12-18 MED ORDER — TIOTROPIUM BROMIDE MONOHYDRATE 18 MCG IN CAPS: 18.0000 ug | ORAL_CAPSULE | Freq: Every day | RESPIRATORY_TRACT | Status: DC

## 2012-12-18 MED ORDER — ALBUTEROL SULFATE (5 MG/ML) 0.5% IN NEBU: 2.5000 mg | INHALATION_SOLUTION | RESPIRATORY_TRACT | Status: DC | PRN

## 2012-12-18 MED ORDER — INSULIN ASPART 100 UNIT/ML ~~LOC~~ SOLN
0.0000 [IU] | Freq: Three times a day (TID) | SUBCUTANEOUS | Status: DC
  Administered 2012-12-18: 4 [IU] via SUBCUTANEOUS

## 2012-12-18 MED ORDER — INSULIN ASPART 100 UNIT/ML ~~LOC~~ SOLN: 0.0000 [IU] | Freq: Every day | SUBCUTANEOUS | Status: DC

## 2012-12-18 MED ORDER — ATORVASTATIN CALCIUM 10 MG PO TABS: 10.0000 mg | ORAL_TABLET | Freq: Every day | ORAL | Status: DC

## 2012-12-18 MED ORDER — TIOTROPIUM BROMIDE MONOHYDRATE 18 MCG IN CAPS
18.0000 ug | ORAL_CAPSULE | Freq: Every day | RESPIRATORY_TRACT | Status: DC
  Filled 2012-12-18: qty 5

## 2012-12-18 MED ORDER — FLUTICASONE PROPIONATE 50 MCG/ACT NA SUSP: 1.0000 | Freq: Every day | NASAL | Status: DC

## 2012-12-18 NOTE — Discharge Summary (Signed)
PATIENT DETAILS Name: [name] Age: 56 y.o. Sex: male Date of Birth: 07/20/1956 MRN: [id number]. Admit Date: 12/15/2012 Admitting Physician: Ron Parker, MD [id number] [name], MD  Recommendations for Outpatient Follow-up:  1. May need initiation of Diabetic Medications at some point.  PRIMARY DISCHARGE DIAGNOSIS:  Principal Problem:  *COPD exacerbation Active Problems:  Type II or unspecified type diabetes mellitus with peripheral circulatory disorders, not stated as uncontrolled(250.70)  DYSLIPIDEMIA  TOBACCO ABUSE  NEUROPATHY  CARDIOMYOPATHY  Chronic systolic heart failure  CAD (coronary artery disease)  SOB (shortness of breath)  Fever INFLUENZA      PAST MEDICAL HISTORY: Past Medical History  Diagnosis Date  . Diabetes mellitus     type 2  . GERD (gastroesophageal reflux disease)     with esophagitis  . PVD (peripheral vascular disease)     bilateral common iliac artery aneurysms. Right SFA occlusion over a long segment. Left SFA disease with occlusion of the left TP trunk. Artirogram Oct. 2006  . History of colonic polyps   . Diverticulosis of colon   . Sleep apnea   . Non-ischemic cardiomyopathy   . Coronary artery disease      LAD a 60-70% stenosis, moderate size second diagonal with 50% stenosis, circumflex AV groove 75-80% stenosis with a branching obtuse marginal with a superior branch subtotal stenosis followed by diffuse disease, right coronary artery  had nonobstructive disease.  EF was 35%  . COPD (chronic obstructive pulmonary disease)   . Tobacco abuse   . Urinary incontinence     detrussor instability    DISCHARGE MEDICATIONS:   Medication List     As of 12/18/2012  9:53 AM    STOP taking these medications         isosorbide mononitrate 30 MG CR tablet   Commonly known as: IMDUR      pravastatin 40 MG tablet   Commonly known as: PRAVACHOL      TAKE these medications         albuterol (5 MG/ML) 0.5% nebulizer solution   Commonly known as: PROVENTIL   Take 0.5 mLs (2.5 mg total) by nebulization every 2 (two) hours as needed for wheezing or shortness of breath.      albuterol 90 MCG/ACT inhaler   Commonly known as: PROVENTIL,VENTOLIN   Inhale 2 puffs into the lungs 4 (four) times daily as needed. For shortness of breath      aspirin 81 MG tablet   Take 81 mg by mouth daily.      atorvastatin 10 MG tablet   Commonly known as: LIPITOR   Take 1 tablet (10 mg total) by mouth daily at 6 PM.      bisoprolol 10 MG tablet   Commonly known as: ZEBETA   Take 1 tablet (10 mg total) by mouth daily.      clopidogrel 75 MG tablet   Commonly known as: PLAVIX   Take 1 tablet (75 mg total) by mouth daily.      enalapril 20 MG tablet   Commonly known as: VASOTEC   Take 1 tablet (20 mg total) by mouth daily.      fluticasone 50 MCG/ACT nasal spray   Commonly known as: FLONASE   Place 1 spray into the nose daily. TAKE FOR 5 MORE DAYS AND THEN STOP      gemfibrozil 600 MG tablet   Commonly known as: LOPID   Take 1 tablet (600 mg total) by mouth 2 (two) times daily before  a meal.      isosorbide mononitrate 20 MG tablet   Commonly known as: ISMO,MONOKET   Take 1 tablet (20 mg total) by mouth 2 (two) times daily.      loratadine 10 MG tablet   Commonly known as: CLARITIN   Take 1 tablet (10 mg total) by mouth daily.      multivitamin per tablet   Take 1 tablet by mouth daily.      oseltamivir 75 MG capsule   Commonly known as: TAMIFLU   Take 1 capsule (75 mg total) by mouth 2 (two) times daily.      predniSONE 10 MG tablet   Commonly known as: DELTASONE   TAKE 4 TABLETS DAILY FOR 3 DAYS, THEN  TAKE 3 TABLETS DAILY FOR 3 DAYS, THEN  TAKE 2 TABLETS DAILY FOR 3 DAYS, THEN  TAKE 1 TABLET DAILY FOR 1 DAYS, AND THEN STOP      tiotropium 18 MCG inhalation capsule   Commonly known as: SPIRIVA   Place 1 capsule (18 mcg total) into inhaler and inhale daily.      vitamin B-12 1000 MCG tablet   Commonly  known as: CYANOCOBALAMIN   Take 1,000 mcg by mouth daily.          BRIEF HPI:  See H&P, Labs, Consult and Test reports for all details in brief,[name] is a 56 y.o. male with Multiple Medical Problems who presents with complaints of worsening SOB and chest tightness X 3 days. He has also had fevers chills sweats and myalgias and nausea and vomiting for the past 48 hours. He reports coughing up light greenish sputum.   CONSULTATIONS:   None  PERTINENT RADIOLOGIC STUDIES: Dg Chest 2 View  12/15/2012  *RADIOLOGY REPORT*  Clinical Data: Shortness of breath, cough, chest pain, weakness  CHEST - 2 VIEW  Comparison: 08/08/2011  Findings: Left subclavian AICD lead tip projects over right ventricle. Upper normal heart size. Mediastinal contours and pulmonary vascularity normal. Bronchitic changes with flattening of the diaphragms question COPD. No acute infiltrate, pleural effusion or pneumothorax. No acute osseous findings. Endplate spur formation lower thoracic spine.  IMPRESSION: Question COPD. No acute abnormalities.   Original Report Authenticated By: Ulyses Southward, M.D.      PERTINENT LAB RESULTS: CBC:  Basename 12/17/12 0610 12/16/12 0535  WBC 9.4 5.5  HGB 13.8 14.6  HCT 39.4 40.7  PLT 246 208   CMET CMP     Component Value Date/Time   NA 138 12/17/2012 0610   K 4.4 12/17/2012 0610   CL 101 12/17/2012 0610   CO2 25 12/17/2012 0610   GLUCOSE 131* 12/17/2012 0610   GLUCOSE 97 01/02/2007 0000   BUN 19 12/17/2012 0610   CREATININE 0.76 12/17/2012 0610   CALCIUM 9.1 12/17/2012 0610   PROT 7.8 12/15/2012 1938   ALBUMIN 4.1 12/15/2012 1938   AST 39* 12/15/2012 1938   ALT 44 12/15/2012 1938   ALKPHOS 96 12/15/2012 1938   BILITOT 0.5 12/15/2012 1938   GFRNONAA >90 12/17/2012 0610   GFRAA >90 12/17/2012 0610    GFR Estimated Creatinine Clearance: 133.3 ml/min (by C-G formula based on Cr of 0.76). No results found for this basename: LIPASE:2,AMYLASE:2 in the last 72  hours  Basename 12/16/12 1158 12/16/12 0535 12/15/12 2231  CKTOTAL -- -- --  CKMB -- -- --  CKMBINDEX -- -- --  TROPONINI <0.30 <0.30 <0.30   No components found with this basename: POCBNP:3 No results found for this  basename: DDIMER:2 in the last 72 hours  Basename 12/15/12 2231  HGBA1C 5.9*   No results found for this basename: CHOL:2,HDL:2,LDLCALC:2,TRIG:2,CHOLHDL:2,LDLDIRECT:2 in the last 72 hours No results found for this basename: TSH,T4TOTAL,FREET3,T3FREE,THYROIDAB in the last 72 hours No results found for this basename: VITAMINB12:2,FOLATE:2,FERRITIN:2,TIBC:2,IRON:2,RETICCTPCT:2 in the last 72 hours Coags: No results found for this basename: PT:2,INR:2 in the last 72 hours Microbiology: No results found for this or any previous visit (from the past 240 hour(s)).   BRIEF HOSPITAL COURSE:   Principal Problem: COPD exacerbation  -patient was admitted with SOB, wheezing, fever -He was started on IV Solumedrol,  Nebulized Bronchodilators, empiric Levaquin  -he made significant clinical improvement, lungs are currently clear -he will be transitioned to tapering prednisone on discharge -start spiriva, c/w albuterol inhaler  Fever/Cough/Myalgias  - Influenza PCR positive  -c/w Tamiiflu for now -Nasal congestion significantly better-c/w Flonase on discharge  DM  -CBG's moderately controlled  -was on SSI during inpatient stay -A1C 5.9-continue with diet and exercise regimen.   HTN  -resume Enalapril and Imdur  -resume Bisoprolol- on discharge  CAD  -known CAD-on medical management.Per outpatient cards note-not a candidate for intervention  -resume ASA, Plavix and Statins  -no chest pain during this hospitalization  TOBACCO ABUSE  -counseled extensively   PVD  -resume ASA,Plavix and Statins  -further follow up as outpatient  TODAY-DAY OF DISCHARGE:  Subjective:   [name] today has no headache,no chest abdominal pain,no new weakness tingling or numbness,  feels much better wants to go home today.   Objective:   Blood pressure 140/74, pulse 64, temperature 98.2 F (36.8 C), temperature source Oral, resp. rate 18, height 5\' 10"  (1.778 m), weight 119 kg (262 lb 5.6 oz), SpO2 93.00%.  Intake/Output Summary (Last 24 hours) at 12/18/12 0953 Last data filed at 12/17/12 1900  Gross per 24 hour  Intake    630 ml  Output    475 ml  Net    155 ml    Exam Awake Alert, Oriented *3, No new F.N deficits, Normal affect [place].AT,PERRAL Supple Neck,No JVD, No cervical lymphadenopathy appriciated.  Symmetrical Chest wall movement, Good air movement bilaterally, No Rhonchi RRR,No Gallops,Rubs or new Murmurs, No Parasternal Heave +ve B.Sounds, Abd Soft, Non tender, No organomegaly appriciated, No rebound -guarding or rigidity. No Cyanosis, Clubbing or edema, No new Rash or bruise  DISCHARGE CONDITION: Stable  DISPOSITION: HOME  DISCHARGE INSTRUCTIONS:    Activity:  As tolerated   Diet recommendation: Heart Healthy diet Follow-up Information    Follow up with Tillman Abide, MD. Schedule an appointment as soon as possible for a visit in 1 week.   Contact information:   [address] [place] [address] [phone]         Total Time spent on discharge equals 45 minutes.  SignedJeoffrey Massed 12/18/2012 9:53 AM

## 2012-12-18 NOTE — Care Management Note (Addendum)
    Page 1 of 1   12/18/2012     11:34:52 AM   CARE MANAGEMENT NOTE 12/18/2012  Patient:  [name], [name]   Account Number:  [id number]  Date Initiated:  12/18/2012  Documentation initiated by:  Letha Cape  Subjective/Objective Assessment:   dx copd ex  admit- lives spouse.  pta independent.     Action/Plan:   Anticipated DC Date:  12/18/2012   Anticipated DC Plan:  HOME/SELF CARE      DC Planning Services  CM consult      Choice offered to / List presented to:     DME arranged  NEBULIZER MACHINE      DME agency  [facility]        Status of service:  Completed, signed off Medicare Important Message given?   (If response is "NO", the following Medicare IM given date fields will be blank) Date Medicare IM given:   Date Additional Medicare IM given:    Discharge Disposition:  HOME/SELF CARE  Per UR Regulation:  Reviewed for med. necessity/level of care/duration of stay  If discussed at Long Length of Stay Meetings, dates discussed:    Comments:  12/18/12 9:48 Letha Cape RN, BSN [phone] patient  lives with spouse, pta independent, patient has medication coverage and transportation.  Order in for nebulizer machine, Justin with [facility] brought up to patient's room.

## 2012-12-18 NOTE — Progress Notes (Signed)
NURSING PROGRESS NOTE  [name] [id number] Discharge Data: 12/18/2012 11:09 AM Attending Provider: Maretta Bees, MD [id number] [name], MD     [name] to be D/C'd Home per MD order.  Discussed with the patient the After Visit Summary and all questions fully answered. All IV's discontinued with no bleeding noted. All belongings returned to patient for patient to take home.   Last Vital Signs:  Blood pressure 138/77, pulse 79, temperature 97.4 F (36.3 C), temperature source Oral, resp. rate 18, height 5\' 10"  (1.778 m), weight 119 kg (262 lb 5.6 oz), SpO2 94.00%.  Discharge Medication List   Medication List     As of 12/18/2012 11:09 AM    STOP taking these medications         isosorbide mononitrate 30 MG CR tablet   Commonly known as: IMDUR      pravastatin 40 MG tablet   Commonly known as: PRAVACHOL      TAKE these medications         albuterol (5 MG/ML) 0.5% nebulizer solution   Commonly known as: PROVENTIL   Take 0.5 mLs (2.5 mg total) by nebulization every 2 (two) hours as needed for wheezing or shortness of breath.      albuterol 90 MCG/ACT inhaler   Commonly known as: PROVENTIL,VENTOLIN   Inhale 2 puffs into the lungs 4 (four) times daily as needed. For shortness of breath      aspirin 81 MG tablet   Take 81 mg by mouth daily.      atorvastatin 10 MG tablet   Commonly known as: LIPITOR   Take 1 tablet (10 mg total) by mouth daily at 6 PM.      bisoprolol 10 MG tablet   Commonly known as: ZEBETA   Take 1 tablet (10 mg total) by mouth daily.      clopidogrel 75 MG tablet   Commonly known as: PLAVIX   Take 1 tablet (75 mg total) by mouth daily.      enalapril 20 MG tablet   Commonly known as: VASOTEC   Take 1 tablet (20 mg total) by mouth daily.      fluticasone 50 MCG/ACT nasal spray   Commonly known as: FLONASE   Place 1 spray into the nose daily. TAKE FOR 5 MORE DAYS AND THEN STOP      gemfibrozil 600 MG tablet   Commonly known as:  LOPID   Take 1 tablet (600 mg total) by mouth 2 (two) times daily before a meal.      isosorbide mononitrate 20 MG tablet   Commonly known as: ISMO,MONOKET   Take 1 tablet (20 mg total) by mouth 2 (two) times daily.      loratadine 10 MG tablet   Commonly known as: CLARITIN   Take 1 tablet (10 mg total) by mouth daily.      multivitamin per tablet   Take 1 tablet by mouth daily.      oseltamivir 75 MG capsule   Commonly known as: TAMIFLU   Take 1 capsule (75 mg total) by mouth 2 (two) times daily.      predniSONE 10 MG tablet   Commonly known as: DELTASONE   TAKE 4 TABLETS DAILY FOR 3 DAYS, THEN  TAKE 3 TABLETS DAILY FOR 3 DAYS, THEN  TAKE 2 TABLETS DAILY FOR 3 DAYS, THEN  TAKE 1 TABLET DAILY FOR 1 DAYS, AND THEN STOP      tiotropium 18 MCG inhalation capsule  Commonly known as: SPIRIVA   Place 1 capsule (18 mcg total) into inhaler and inhale daily.      vitamin B-12 1000 MCG tablet   Commonly known as: CYANOCOBALAMIN   Take 1,000 mcg by mouth daily.        Nikelle Malatesta, Elmarie Mainland, RN

## 2012-12-22 ENCOUNTER — Encounter: Payer: Self-pay | Admitting: *Deleted

## 2012-12-29 ENCOUNTER — Ambulatory Visit (INDEPENDENT_AMBULATORY_CARE_PROVIDER_SITE_OTHER): Payer: BC Managed Care – PPO | Admitting: Internal Medicine

## 2012-12-29 ENCOUNTER — Encounter: Payer: Self-pay | Admitting: Internal Medicine

## 2012-12-29 VITALS — BP 122/80 | HR 60 | Temp 97.5°F | Resp 12 | Wt 262.0 lb

## 2012-12-29 DIAGNOSIS — F17200 Nicotine dependence, unspecified, uncomplicated: Secondary | ICD-10-CM | POA: Diagnosis not present

## 2012-12-29 DIAGNOSIS — I5022 Chronic systolic (congestive) heart failure: Secondary | ICD-10-CM | POA: Diagnosis not present

## 2012-12-29 DIAGNOSIS — E785 Hyperlipidemia, unspecified: Secondary | ICD-10-CM

## 2012-12-29 DIAGNOSIS — J441 Chronic obstructive pulmonary disease with (acute) exacerbation: Secondary | ICD-10-CM

## 2012-12-29 MED ORDER — ALBUTEROL SULFATE HFA 108 (90 BASE) MCG/ACT IN AERS: 2.0000 | INHALATION_SPRAY | Freq: Four times a day (QID) | RESPIRATORY_TRACT | Status: DC | PRN

## 2012-12-29 NOTE — Assessment & Plan Note (Signed)
Improved but clearly not at baseline Not really tight so no need for prednisone again Will continue the spiriva for now but reconsider at follow up Nebulizer for prn use only

## 2012-12-29 NOTE — Assessment & Plan Note (Signed)
Has cut down considerably Discussed quitting entirely and he knows he has to Counseled 3-4 minutes Info given

## 2012-12-29 NOTE — Progress Notes (Signed)
Subjective:    Patient ID: [name], male    DOB: 06/04/1956, 57 y.o.   MRN: [id number]  HPI Hospital follow up Still doesn't feel good Dizzy feeling, tired, breathing not back to normal Does feel better than when in the hospital  Has had some wheezing Earache Chest congestion Easy DOE and fatigue  No chest pain--- or just every once in a while per his usual No palpitations No syncope but just lightheaded  Has cut down on cigarettes from 2PPD to under 1/2 PPD Did quit for 3 years in past---just did cold [place] Didn't smoke for 3 days before hospital and then while in hospital Finds he uses it due to boredom and just sitting around  Current Outpatient Prescriptions on File Prior to Visit  Medication Sig Dispense Refill  . albuterol (PROVENTIL) (5 MG/ML) 0.5% nebulizer solution Take 0.5 mLs (2.5 mg total) by nebulization every 2 (two) hours as needed for wheezing or shortness of breath.  20 mL  0  . aspirin 81 MG tablet Take 81 mg by mouth daily.        Marland Kitchen atorvastatin (LIPITOR) 10 MG tablet Take 1 tablet (10 mg total) by mouth daily at 6 PM.  30 tablet  0  . bisoprolol (ZEBETA) 10 MG tablet Take 1 tablet (10 mg total) by mouth daily.  30 tablet  6  . clopidogrel (PLAVIX) 75 MG tablet Take 1 tablet (75 mg total) by mouth daily.  90 tablet  3  . enalapril (VASOTEC) 20 MG tablet Take 1 tablet (20 mg total) by mouth daily.  30 tablet  11  . gemfibrozil (LOPID) 600 MG tablet Take 1 tablet (600 mg total) by mouth 2 (two) times daily before a meal.  60 tablet  11  . isosorbide mononitrate (ISMO,MONOKET) 20 MG tablet Take 1 tablet (20 mg total) by mouth 2 (two) times daily.  60 tablet  5  . multivitamin (THERAGRAN) per tablet Take 1 tablet by mouth daily.        Marland Kitchen tiotropium (SPIRIVA HANDIHALER) 18 MCG inhalation capsule Place 1 capsule (18 mcg total) into inhaler and inhale daily.  30 capsule  0  . vitamin B-12 (CYANOCOBALAMIN) 1000 MCG tablet Take 1,000 mcg by mouth daily.           No Known Allergies  Past Medical History  Diagnosis Date  . Diabetes mellitus     type 2  . GERD (gastroesophageal reflux disease)     with esophagitis  . PVD (peripheral vascular disease)     bilateral common iliac artery aneurysms. Right SFA occlusion over a long segment. Left SFA disease with occlusion of the left TP trunk. Artirogram Oct. 2006  . History of colonic polyps   . Diverticulosis of colon   . Sleep apnea   . Non-ischemic cardiomyopathy   . Coronary artery disease      LAD a 60-70% stenosis, moderate size second diagonal with 50% stenosis, circumflex AV groove 75-80% stenosis with a branching obtuse marginal with a superior branch subtotal stenosis followed by diffuse disease, right coronary artery  had nonobstructive disease.  EF was 35%  . COPD (chronic obstructive pulmonary disease)   . Tobacco abuse   . Urinary incontinence     detrussor instability    Past Surgical History  Procedure Date  . S/p icd placement      Medtronic Maximo [phone] single chamber  . Colonoscopy 04/2005  . Cardiac catheterization 06/2004    negative  .  Pacemaker insertion 11/2005  . Copd exacerbation     Family History  Problem Relation Age of Onset  . Stroke Father   . Coronary artery disease Maternal Aunt   . Heart failure Maternal Aunt   . Lung cancer Maternal Aunt   . Lung cancer Maternal Uncle   . Cancer Brother     Mouth  . Cancer Sister     throat    History   Social History  . Marital Status: Married    Spouse Name: N/A    Number of Children: N/A  . Years of Education: N/A   Occupational History  . Not on file.   Social History Main Topics  . Smoking status: Current Every Day Smoker -- 2.0 packs/day for 40 years    Types: Cigarettes  . Smokeless tobacco: Never Used     Comment: GAVE 1-800-QUIT-NOW  . Alcohol Use: No  . Drug Use: No  . Sexually Active: Not on file   Other Topics Concern  . Not on file   Social History Narrative  . No narrative  on file   Review of Systems Appetite is okay now Weight is at previous baseline    Objective:   Physical Exam  Constitutional: He appears well-developed and well-nourished. No distress.  Neck: Normal range of motion. Neck supple. No thyromegaly present.  Pulmonary/Chest: Effort normal. No respiratory distress. He has no wheezes. He has no rales.       Fair air movement Scattered rhonchi Not really tight  Musculoskeletal: He exhibits no edema and no tenderness.  Lymphadenopathy:    He has no cervical adenopathy.          Assessment & Plan:

## 2012-12-29 NOTE — Assessment & Plan Note (Signed)
Doesn't seem to have been exacerbated Neutral fluid status No changes

## 2012-12-29 NOTE — Assessment & Plan Note (Signed)
Switched to atorvastatin Will check labs at next visit

## 2012-12-29 NOTE — Patient Instructions (Signed)
Call 1-800 QUIT NOW to get information to help you stop smoking.

## 2013-01-19 ENCOUNTER — Other Ambulatory Visit: Payer: Self-pay

## 2013-01-19 MED ORDER — ENALAPRIL MALEATE 20 MG PO TABS: 20.0000 mg | ORAL_TABLET | Freq: Every day | ORAL | Status: DC

## 2013-01-28 ENCOUNTER — Encounter: Payer: Self-pay | Admitting: Internal Medicine

## 2013-01-28 ENCOUNTER — Ambulatory Visit (INDEPENDENT_AMBULATORY_CARE_PROVIDER_SITE_OTHER): Payer: BC Managed Care – PPO | Admitting: Internal Medicine

## 2013-01-28 VITALS — BP 148/80 | HR 60 | Temp 98.4°F | Wt 268.0 lb

## 2013-01-28 DIAGNOSIS — E1159 Type 2 diabetes mellitus with other circulatory complications: Secondary | ICD-10-CM | POA: Diagnosis not present

## 2013-01-28 DIAGNOSIS — F17200 Nicotine dependence, unspecified, uncomplicated: Secondary | ICD-10-CM

## 2013-01-28 DIAGNOSIS — E785 Hyperlipidemia, unspecified: Secondary | ICD-10-CM

## 2013-01-28 DIAGNOSIS — Z720 Tobacco use: Secondary | ICD-10-CM

## 2013-01-28 DIAGNOSIS — I5022 Chronic systolic (congestive) heart failure: Secondary | ICD-10-CM | POA: Diagnosis not present

## 2013-01-28 DIAGNOSIS — J449 Chronic obstructive pulmonary disease, unspecified: Secondary | ICD-10-CM

## 2013-01-28 LAB — LDL CHOLESTEROL, DIRECT: Direct LDL: 51.2 mg/dL

## 2013-01-28 LAB — LIPID PANEL
Cholesterol: 155 mg/dL (ref 0–200)
Total CHOL/HDL Ratio: 4
Triglycerides: 290 mg/dL — ABNORMAL HIGH (ref 0.0–149.0)
VLDL: 58 mg/dL — ABNORMAL HIGH (ref 0.0–40.0)

## 2013-01-28 LAB — HEPATIC FUNCTION PANEL
AST: 22 U/L (ref 0–37)
Albumin: 4.4 g/dL (ref 3.5–5.2)
Alkaline Phosphatase: 72 U/L (ref 39–117)
Total Bilirubin: 0.7 mg/dL (ref 0.3–1.2)

## 2013-01-28 NOTE — Assessment & Plan Note (Signed)
Seems back to his baseline No changes needed

## 2013-01-28 NOTE — Assessment & Plan Note (Signed)
Counseled for about 3 minutes He has quit without aides in past when he was ready---he is not now Discussed anyway

## 2013-01-28 NOTE — Assessment & Plan Note (Signed)
Now on atorvastatin for about 6 weeks Will check labs

## 2013-01-28 NOTE — Assessment & Plan Note (Signed)
Neutral fluid status despite the weight gain (eating more) Limited by claudication No changes needed

## 2013-01-28 NOTE — Assessment & Plan Note (Signed)
Lab Results  Component Value Date   HGBA1C 5.9* 12/15/2012   Doesn't check but fortunately control has been fine Will recheck next time

## 2013-01-28 NOTE — Progress Notes (Signed)
Subjective:    Patient ID: [name], male    DOB: Sep 08, 1956, 57 y.o.   MRN: [id number]  HPI Feels better Pretty much back to normal Has gained 6# now--eating 3 meals instead of just one Discussed proper eating (like he has been having creamed beef in AM)  Doesn't check sugars  Ongoing leg pain--nothing different  Breathing is good Only occ wheezing--better with inhaler No chest pain No dizziness or syncope No sig edema  No apparent problems on the atorvastatin Due for labs  Has kept down on his cigarettes 1/2-3/4 PPD Not willing to quit-- it is the only enjoyment he has (goes out on porch to smoke)  Current Outpatient Prescriptions on File Prior to Visit  Medication Sig Dispense Refill  . albuterol (PROVENTIL HFA;VENTOLIN HFA) 108 (90 BASE) MCG/ACT inhaler Inhale 2 puffs into the lungs 4 (four) times daily as needed.  18 g  3  . albuterol (PROVENTIL) (5 MG/ML) 0.5% nebulizer solution Take 0.5 mLs (2.5 mg total) by nebulization every 2 (two) hours as needed for wheezing or shortness of breath.  20 mL  0  . aspirin 81 MG tablet Take 81 mg by mouth daily.        Marland Kitchen atorvastatin (LIPITOR) 10 MG tablet Take 1 tablet (10 mg total) by mouth daily at 6 PM.  30 tablet  0  . bisoprolol (ZEBETA) 10 MG tablet Take 1 tablet (10 mg total) by mouth daily.  30 tablet  6  . clopidogrel (PLAVIX) 75 MG tablet Take 1 tablet (75 mg total) by mouth daily.  90 tablet  3  . enalapril (VASOTEC) 20 MG tablet Take 1 tablet (20 mg total) by mouth daily.  30 tablet  5  . gemfibrozil (LOPID) 600 MG tablet Take 1 tablet (600 mg total) by mouth 2 (two) times daily before a meal.  60 tablet  11  . isosorbide mononitrate (ISMO,MONOKET) 20 MG tablet Take 1 tablet (20 mg total) by mouth 2 (two) times daily.  60 tablet  5  . multivitamin (THERAGRAN) per tablet Take 1 tablet by mouth daily.        Marland Kitchen tiotropium (SPIRIVA HANDIHALER) 18 MCG inhalation capsule Place 1 capsule (18 mcg total) into inhaler and  inhale daily.  30 capsule  0  . vitamin B-12 (CYANOCOBALAMIN) 1000 MCG tablet Take 1,000 mcg by mouth daily.          No Known Allergies  Past Medical History  Diagnosis Date  . Diabetes mellitus     type 2  . GERD (gastroesophageal reflux disease)     with esophagitis  . PVD (peripheral vascular disease)     bilateral common iliac artery aneurysms. Right SFA occlusion over a long segment. Left SFA disease with occlusion of the left TP trunk. Artirogram Oct. 2006  . History of colonic polyps   . Diverticulosis of colon   . Sleep apnea   . Non-ischemic cardiomyopathy   . Coronary artery disease      LAD a 60-70% stenosis, moderate size second diagonal with 50% stenosis, circumflex AV groove 75-80% stenosis with a branching obtuse marginal with a superior branch subtotal stenosis followed by diffuse disease, right coronary artery  had nonobstructive disease.  EF was 35%  . COPD (chronic obstructive pulmonary disease)   . Tobacco abuse   . Urinary incontinence     detrussor instability    Past Surgical History  Procedure Date  . S/p icd placement  Medtronic Maximo [phone] single chamber  . Colonoscopy 04/2005  . Cardiac catheterization 06/2004    negative  . Pacemaker insertion 11/2005  . Copd exacerbation     Family History  Problem Relation Age of Onset  . Stroke Father   . Coronary artery disease Maternal Aunt   . Heart failure Maternal Aunt   . Lung cancer Maternal Aunt   . Lung cancer Maternal Uncle   . Cancer Brother     Mouth  . Cancer Sister     throat    History   Social History  . Marital Status: Married    Spouse Name: N/A    Number of Children: N/A  . Years of Education: N/A   Occupational History  . Not on file.   Social History Main Topics  . Smoking status: Current Every Day Smoker -- 2.0 packs/day for 40 years    Types: Cigarettes  . Smokeless tobacco: Never Used     Comment: GAVE 1-800-QUIT-NOW  . Alcohol Use: No  . Drug Use: No  .  Sexually Active: Not on file   Other Topics Concern  . Not on file   Social History Narrative  . No narrative on file   Review of Systems Recent cold---seems to have resolved Has CPAP but still doesn't sleep well. Sleeps in recliner. No PND    Objective:   Physical Exam  Constitutional: He appears well-developed and well-nourished. No distress.  Neck: Normal range of motion. Neck supple.  Cardiovascular: Normal rate, regular rhythm and normal heart sounds.  Exam reveals no gallop.   No murmur heard. Pulmonary/Chest: Effort normal. No respiratory distress. He has no wheezes. He has no rales.       Decreased breath sounds but clear  Musculoskeletal: He exhibits no edema.  Lymphadenopathy:    He has no cervical adenopathy.  Psychiatric: He has a normal mood and affect. His behavior is normal.          Assessment & Plan:

## 2013-02-02 ENCOUNTER — Other Ambulatory Visit: Payer: Self-pay | Admitting: *Deleted

## 2013-02-02 NOTE — Telephone Encounter (Signed)
Ok to fill? Note from pharmacy asking if refills ok? Pt also on plavix

## 2013-02-03 MED ORDER — ATORVASTATIN CALCIUM 10 MG PO TABS: 10.0000 mg | ORAL_TABLET | Freq: Every day | ORAL | Status: DC

## 2013-02-03 NOTE — Telephone Encounter (Signed)
Should refill for a year

## 2013-02-03 NOTE — Telephone Encounter (Signed)
rx sent to pharmacy by e-script  

## 2013-04-07 ENCOUNTER — Other Ambulatory Visit: Payer: Self-pay

## 2013-04-07 MED ORDER — ISOSORBIDE MONONITRATE 20 MG PO TABS: 20.0000 mg | ORAL_TABLET | Freq: Two times a day (BID) | ORAL | Status: DC

## 2013-04-07 MED ORDER — BISOPROLOL FUMARATE 10 MG PO TABS: 10.0000 mg | ORAL_TABLET | Freq: Every day | ORAL | Status: DC

## 2013-04-07 MED ORDER — CLOPIDOGREL BISULFATE 75 MG PO TABS: 75.0000 mg | ORAL_TABLET | Freq: Every day | ORAL | Status: DC

## 2013-04-26 ENCOUNTER — Ambulatory Visit (INDEPENDENT_AMBULATORY_CARE_PROVIDER_SITE_OTHER): Payer: BC Managed Care – PPO | Admitting: Internal Medicine

## 2013-04-26 ENCOUNTER — Encounter: Payer: Self-pay | Admitting: Internal Medicine

## 2013-04-26 VITALS — BP 120/70 | HR 80 | Wt 264.0 lb

## 2013-04-26 DIAGNOSIS — E785 Hyperlipidemia, unspecified: Secondary | ICD-10-CM

## 2013-04-26 DIAGNOSIS — I5022 Chronic systolic (congestive) heart failure: Secondary | ICD-10-CM | POA: Diagnosis not present

## 2013-04-26 DIAGNOSIS — J449 Chronic obstructive pulmonary disease, unspecified: Secondary | ICD-10-CM

## 2013-04-26 DIAGNOSIS — E1159 Type 2 diabetes mellitus with other circulatory complications: Secondary | ICD-10-CM | POA: Diagnosis not present

## 2013-04-26 LAB — HEMOGLOBIN A1C: Hgb A1c MFr Bld: 6.2 % (ref 4.6–6.5)

## 2013-04-26 NOTE — Assessment & Plan Note (Signed)
Has been more active so his stamina is the best it has been in a long time This is why he is more upbeat No changes needed

## 2013-04-26 NOTE — Assessment & Plan Note (Signed)
Good control on med Labs next time

## 2013-04-26 NOTE — Progress Notes (Signed)
Subjective:    Patient ID: [name], male    DOB: 12-14-1956, 57 y.o.   MRN: [id number]  HPI Doing fine No new concerns  Still smoking Has gone back up to 1PPD  Not willing to stop  Still less than former 2 PPD  Hasn't been checking sugars No hypoglycemic spells  Did have some chest pain while outside working last week--starting garden in buckets Didn't take nitro--just went to bed and it got better No SOB Thinks it may have been heartburn No palpitations  Regular cough Intermittent sputum--no change over the years No fever Doesn't feel sick at all  Current Outpatient Prescriptions on File Prior to Visit  Medication Sig Dispense Refill  . albuterol (PROVENTIL HFA;VENTOLIN HFA) 108 (90 BASE) MCG/ACT inhaler Inhale 2 puffs into the lungs 4 (four) times daily as needed.  18 g  3  . albuterol (PROVENTIL) (5 MG/ML) 0.5% nebulizer solution Take 0.5 mLs (2.5 mg total) by nebulization every 2 (two) hours as needed for wheezing or shortness of breath.  20 mL  0  . aspirin 81 MG tablet Take 81 mg by mouth daily.        Marland Kitchen atorvastatin (LIPITOR) 10 MG tablet Take 1 tablet (10 mg total) by mouth daily at 6 PM.  30 tablet  11  . bisoprolol (ZEBETA) 10 MG tablet Take 1 tablet (10 mg total) by mouth daily.  30 tablet  2  . clopidogrel (PLAVIX) 75 MG tablet Take 1 tablet (75 mg total) by mouth daily.  90 tablet  3  . enalapril (VASOTEC) 20 MG tablet Take 1 tablet (20 mg total) by mouth daily.  30 tablet  5  . gemfibrozil (LOPID) 600 MG tablet Take 1 tablet (600 mg total) by mouth 2 (two) times daily before a meal.  60 tablet  11  . isosorbide mononitrate (ISMO,MONOKET) 20 MG tablet Take 1 tablet (20 mg total) by mouth 2 (two) times daily.  60 tablet  5  . multivitamin (THERAGRAN) per tablet Take 1 tablet by mouth daily.        Marland Kitchen tiotropium (SPIRIVA HANDIHALER) 18 MCG inhalation capsule Place 1 capsule (18 mcg total) into inhaler and inhale daily.  30 capsule  0  . vitamin B-12  (CYANOCOBALAMIN) 1000 MCG tablet Take 1,000 mcg by mouth daily.         No current facility-administered medications on file prior to visit.    No Known Allergies  Past Medical History  Diagnosis Date  . Diabetes mellitus     type 2  . GERD (gastroesophageal reflux disease)     with esophagitis  . PVD (peripheral vascular disease)     bilateral common iliac artery aneurysms. Right SFA occlusion over a long segment. Left SFA disease with occlusion of the left TP trunk. Artirogram Oct. 2006  . History of colonic polyps   . Diverticulosis of colon   . Sleep apnea   . Non-ischemic cardiomyopathy   . Coronary artery disease      LAD a 60-70% stenosis, moderate size second diagonal with 50% stenosis, circumflex AV groove 75-80% stenosis with a branching obtuse marginal with a superior branch subtotal stenosis followed by diffuse disease, right coronary artery  had nonobstructive disease.  EF was 35%  . COPD (chronic obstructive pulmonary disease)   . Tobacco abuse   . Urinary incontinence     detrussor instability    Past Surgical History  Procedure Laterality Date  . S/p icd placement  Medtronic Maximo [phone] single chamber  . Colonoscopy  04/2005  . Cardiac catheterization  06/2004    negative  . Pacemaker insertion  11/2005  . Copd exacerbation      Family History  Problem Relation Age of Onset  . Stroke Father   . Coronary artery disease Maternal Aunt   . Heart failure Maternal Aunt   . Lung cancer Maternal Aunt   . Lung cancer Maternal Uncle   . Cancer Brother     Mouth  . Cancer Sister     throat    History   Social History  . Marital Status: Married    Spouse Name: N/A    Number of Children: N/A  . Years of Education: N/A   Occupational History  . Not on file.   Social History Main Topics  . Smoking status: Current Every Day Smoker -- 2.00 packs/day for 40 years    Types: Cigarettes  . Smokeless tobacco: Never Used     Comment: GAVE  1-800-QUIT-NOW  . Alcohol Use: No  . Drug Use: No  . Sexually Active: Not on file   Other Topics Concern  . Not on file   Social History Narrative  . No narrative on file   Review of Systems Has been walking more---stamina is better Weight is down a few pounds Sleeps okay--- uses CPAP still Frequent nocturia is stable    Objective:   Physical Exam  Constitutional: He appears well-developed and well-nourished. No distress.  Neck: Normal range of motion. Neck supple. No thyromegaly present.  Cardiovascular: Normal rate, regular rhythm and normal heart sounds.  Exam reveals no gallop.   No murmur heard. Pulses not palpable in feet Perfusion seems adequate  Pulmonary/Chest: Effort normal. No respiratory distress. He has no wheezes. He has no rales.  Slightly decreased breath sounds but clear  Musculoskeletal: He exhibits no edema and no tenderness.  Lymphadenopathy:    He has no cervical adenopathy.  Skin: No rash noted.  No foot lesions  Psychiatric: He has a normal mood and affect. His behavior is normal.  Most upbeat I can remember him!          Assessment & Plan:

## 2013-04-26 NOTE — Patient Instructions (Signed)
Please set up your diabetic eye exam. 

## 2013-04-26 NOTE — Assessment & Plan Note (Signed)
Mild chronic cough Not willing to stop cigarettes

## 2013-04-26 NOTE — Assessment & Plan Note (Signed)
Doesn't check sugars Lab Results  Component Value Date   HGBA1C 5.9* 12/15/2012   Will check lab again Due for eye exam?

## 2013-06-28 ENCOUNTER — Other Ambulatory Visit: Payer: Self-pay | Admitting: *Deleted

## 2013-06-28 MED ORDER — GEMFIBROZIL 600 MG PO TABS: 600.0000 mg | ORAL_TABLET | Freq: Two times a day (BID) | ORAL | Status: DC

## 2013-09-01 ENCOUNTER — Other Ambulatory Visit: Payer: Self-pay

## 2013-09-01 MED ORDER — ENALAPRIL MALEATE 20 MG PO TABS: 20.0000 mg | ORAL_TABLET | Freq: Every day | ORAL | Status: DC

## 2013-09-02 ENCOUNTER — Ambulatory Visit (INDEPENDENT_AMBULATORY_CARE_PROVIDER_SITE_OTHER): Payer: BC Managed Care – PPO | Admitting: Internal Medicine

## 2013-09-02 ENCOUNTER — Encounter: Payer: Self-pay | Admitting: Internal Medicine

## 2013-09-02 VITALS — BP 132/70 | HR 79 | Temp 97.7°F | Wt 253.0 lb

## 2013-09-02 DIAGNOSIS — M75100 Unspecified rotator cuff tear or rupture of unspecified shoulder, not specified as traumatic: Secondary | ICD-10-CM | POA: Diagnosis not present

## 2013-09-02 DIAGNOSIS — M62838 Other muscle spasm: Secondary | ICD-10-CM | POA: Insufficient documentation

## 2013-09-02 DIAGNOSIS — M7552 Bursitis of left shoulder: Secondary | ICD-10-CM

## 2013-09-02 DIAGNOSIS — M7550 Bursitis of unspecified shoulder: Secondary | ICD-10-CM | POA: Insufficient documentation

## 2013-09-02 NOTE — Progress Notes (Signed)
Subjective:    Patient ID: [name], male    DOB: [birth year], 57 y.o.   MRN: [id number]  HPI 2 weeks ago started with left shoulder pain Progressively went to neck and then upper arm Now has tingling in hand Severe  Can't sleep because of it  Has been working in the garden Did notice a pop one day--- then progressively worsened Tylenol no help No other Rx  Current Outpatient Prescriptions on File Prior to Visit  Medication Sig Dispense Refill  . albuterol (PROVENTIL HFA;VENTOLIN HFA) 108 (90 BASE) MCG/ACT inhaler Inhale 2 puffs into the lungs 4 (four) times daily as needed.  18 g  3  . albuterol (PROVENTIL) (5 MG/ML) 0.5% nebulizer solution Take 0.5 mLs (2.5 mg total) by nebulization every 2 (two) hours as needed for wheezing or shortness of breath.  20 mL  0  . aspirin 81 MG tablet Take 81 mg by mouth daily.        Marland Kitchen atorvastatin (LIPITOR) 10 MG tablet Take 1 tablet (10 mg total) by mouth daily at 6 PM.  30 tablet  11  . bisoprolol (ZEBETA) 10 MG tablet Take 1 tablet (10 mg total) by mouth daily.  30 tablet  2  . clopidogrel (PLAVIX) 75 MG tablet Take 1 tablet (75 mg total) by mouth daily.  90 tablet  3  . enalapril (VASOTEC) 20 MG tablet Take 1 tablet (20 mg total) by mouth daily.  30 tablet  0  . gemfibrozil (LOPID) 600 MG tablet Take 1 tablet (600 mg total) by mouth 2 (two) times daily before a meal.  60 tablet  6  . isosorbide mononitrate (ISMO,MONOKET) 20 MG tablet Take 1 tablet (20 mg total) by mouth 2 (two) times daily.  60 tablet  5  . multivitamin (THERAGRAN) per tablet Take 1 tablet by mouth daily.        Marland Kitchen tiotropium (SPIRIVA HANDIHALER) 18 MCG inhalation capsule Place 1 capsule (18 mcg total) into inhaler and inhale daily.  30 capsule  0  . vitamin B-12 (CYANOCOBALAMIN) 1000 MCG tablet Take 1,000 mcg by mouth daily.         No current facility-administered medications on file prior to visit.    No Known Allergies  Past Medical History  Diagnosis Date  .  Diabetes mellitus     type 2  . GERD (gastroesophageal reflux disease)     with esophagitis  . PVD (peripheral vascular disease)     bilateral common iliac artery aneurysms. Right SFA occlusion over a long segment. Left SFA disease with occlusion of the left TP trunk. Artirogram Oct. 2006  . History of colonic polyps   . Diverticulosis of colon   . Sleep apnea   . Non-ischemic cardiomyopathy   . Coronary artery disease      LAD a 60-70% stenosis, moderate size second diagonal with 50% stenosis, circumflex AV groove 75-80% stenosis with a branching obtuse marginal with a superior branch subtotal stenosis followed by diffuse disease, right coronary artery  had nonobstructive disease.  EF was 35%  . COPD (chronic obstructive pulmonary disease)   . Tobacco abuse   . Urinary incontinence     detrussor instability    Past Surgical History  Procedure Laterality Date  . S/p icd placement       Medtronic Maximo [phone] single chamber  . Colonoscopy  04/2005  . Cardiac catheterization  06/2004    negative  . Pacemaker insertion  11/2005  . Copd exacerbation  Family History  Problem Relation Age of Onset  . Stroke Father   . Coronary artery disease Maternal Aunt   . Heart failure Maternal Aunt   . Lung cancer Maternal Aunt   . Lung cancer Maternal Uncle   . Cancer Brother     Mouth  . Cancer Sister     throat    History   Social History  . Marital Status: Married    Spouse Name: N/A    Number of Children: N/A  . Years of Education: N/A   Occupational History  . Not on file.   Social History Main Topics  . Smoking status: Current Every Day Smoker -- 2.00 packs/day for 40 years    Types: Cigarettes  . Smokeless tobacco: Never Used     Comment: GAVE 1-800-QUIT-NOW  . Alcohol Use: No  . Drug Use: No  . Sexual Activity: Not on file   Other Topics Concern  . Not on file   Social History Narrative  . No narrative on file     Review of Systems Arm feels  weak---relates to pain Can't raise his arm above shoulder level Has lost 11#!!    Objective:   Physical Exam  Constitutional: He appears well-developed and well-nourished. No distress.  Neck:  Tenderness along mid left neck Left trapezius has mild spasm  Musculoskeletal:  Marked left subacromial tenderness Moderate restriction of external rotation  Neurological:  UE strength is normal          Assessment & Plan:

## 2013-09-02 NOTE — Assessment & Plan Note (Signed)
Probably from strain working in garden Discussed heat Rx for cyclobenzaprine for bedtime

## 2013-09-02 NOTE — Assessment & Plan Note (Signed)
Fairly severe Probably the reason for the severe pain Discussed alternatives Will proceed with injection  PROCEDURE Sterile prep Ethyl chloride then 2cc plain 2% lidocaine 40mg  depomedrol and 5 cc 2%lido instilled in subacromial space on left with immediate relief of pain Discussed home care

## 2013-10-01 ENCOUNTER — Other Ambulatory Visit: Payer: Self-pay

## 2013-10-01 MED ORDER — ENALAPRIL MALEATE 20 MG PO TABS: 20.0000 mg | ORAL_TABLET | Freq: Every day | ORAL | Status: DC

## 2013-10-07 ENCOUNTER — Encounter: Payer: Self-pay | Admitting: Internal Medicine

## 2013-10-07 ENCOUNTER — Ambulatory Visit (INDEPENDENT_AMBULATORY_CARE_PROVIDER_SITE_OTHER): Payer: BC Managed Care – PPO | Admitting: Internal Medicine

## 2013-10-07 VITALS — BP 115/69 | HR 70 | Ht 70.0 in | Wt 246.6 lb

## 2013-10-07 DIAGNOSIS — I428 Other cardiomyopathies: Secondary | ICD-10-CM | POA: Diagnosis not present

## 2013-10-07 DIAGNOSIS — Z95810 Presence of automatic (implantable) cardiac defibrillator: Secondary | ICD-10-CM

## 2013-10-07 DIAGNOSIS — I5022 Chronic systolic (congestive) heart failure: Secondary | ICD-10-CM

## 2013-10-07 DIAGNOSIS — I2510 Atherosclerotic heart disease of native coronary artery without angina pectoris: Secondary | ICD-10-CM

## 2013-10-07 NOTE — Assessment & Plan Note (Signed)
His Medtronic single-chamber ICD is working normally. He is approaching elective replacement. At the time of his generator change, I would anticipate adding a new ICD lead as the patient currently has a Medtronic [phone] defibrillation lead.

## 2013-10-07 NOTE — Assessment & Plan Note (Signed)
His chronic systolic heart failure has improved from class III to class II with exercise. He will continue his current medical therapy. I've asked the patient to continue his regular exercise program.

## 2013-10-07 NOTE — Assessment & Plan Note (Signed)
The patient denies anginal symptoms and remains fairly active, gardening. He will continue his current medical therapy.

## 2013-10-07 NOTE — Progress Notes (Signed)
HPI [name] returns today for followup after a long absence from our arrhythmia clinic. He is a 57 year old man with a history of a nonischemic cardiomyopathy as well as coronary artery disease which is modest. He has chronic systolic heart failure, class II as well as morbid obesity. Over the last 6 months, the patient is to increase his physical activity, by working in the garden. He is lost over 20 pounds, and he has gone from being very sedentary to fairly active, working in his garden for several hours in duration.  He denies chest pain or syncope. No recent ICD shock. No Known Allergies   Current Outpatient Prescriptions  Medication Sig Dispense Refill  . albuterol (PROVENTIL HFA;VENTOLIN HFA) 108 (90 BASE) MCG/ACT inhaler Inhale 2 puffs into the lungs 4 (four) times daily as needed.  18 g  3  . albuterol (PROVENTIL) (5 MG/ML) 0.5% nebulizer solution Take 0.5 mLs (2.5 mg total) by nebulization every 2 (two) hours as needed for wheezing or shortness of breath.  20 mL  0  . aspirin 81 MG tablet Take 81 mg by mouth daily.        Marland Kitchen atorvastatin (LIPITOR) 10 MG tablet Take 1 tablet (10 mg total) by mouth daily at 6 PM.  30 tablet  11  . bisoprolol (ZEBETA) 10 MG tablet Take 1 tablet (10 mg total) by mouth daily.  30 tablet  2  . clopidogrel (PLAVIX) 75 MG tablet Take 1 tablet (75 mg total) by mouth daily.  90 tablet  3  . enalapril (VASOTEC) 20 MG tablet Take 1 tablet (20 mg total) by mouth daily.  30 tablet  2  . gemfibrozil (LOPID) 600 MG tablet Take 1 tablet (600 mg total) by mouth 2 (two) times daily before a meal.  60 tablet  6  . isosorbide mononitrate (ISMO,MONOKET) 20 MG tablet Take 1 tablet (20 mg total) by mouth 2 (two) times daily.  60 tablet  5  . multivitamin (THERAGRAN) per tablet Take 1 tablet by mouth daily.        Marland Kitchen tiotropium (SPIRIVA HANDIHALER) 18 MCG inhalation capsule Place 1 capsule (18 mcg total) into inhaler and inhale daily.  30 capsule  0  . vitamin B-12  (CYANOCOBALAMIN) 1000 MCG tablet Take 1,000 mcg by mouth daily.         No current facility-administered medications for this visit.     Past Medical History  Diagnosis Date  . Diabetes mellitus     type 2  . GERD (gastroesophageal reflux disease)     with esophagitis  . PVD (peripheral vascular disease)     bilateral common iliac artery aneurysms. Right SFA occlusion over a long segment. Left SFA disease with occlusion of the left TP trunk. Artirogram Oct. 2006  . History of colonic polyps   . Diverticulosis of colon   . Sleep apnea   . Non-ischemic cardiomyopathy   . Coronary artery disease      LAD a 60-70% stenosis, moderate size second diagonal with 50% stenosis, circumflex AV groove 75-80% stenosis with a branching obtuse marginal with a superior branch subtotal stenosis followed by diffuse disease, right coronary artery  had nonobstructive disease.  EF was 35%  . COPD (chronic obstructive pulmonary disease)   . Tobacco abuse   . Urinary incontinence     detrussor instability    ROS:   All systems reviewed and negative except as noted in the HPI.   Past Surgical History  Procedure Laterality Date  . S/p icd placement       Medtronic Maximo [phone] single chamber  . Colonoscopy  04/2005  . Cardiac catheterization  06/2004    negative  . Pacemaker insertion  11/2005  . Copd exacerbation       Family History  Problem Relation Age of Onset  . Stroke Father   . Coronary artery disease Maternal Aunt   . Heart failure Maternal Aunt   . Lung cancer Maternal Aunt   . Lung cancer Maternal Uncle   . Cancer Brother     Mouth  . Cancer Sister     throat     History   Social History  . Marital Status: Married    Spouse Name: N/A    Number of Children: N/A  . Years of Education: N/A   Occupational History  . Not on file.   Social History Main Topics  . Smoking status: Current Every Day Smoker -- 2.00 packs/day for 40 years    Types: Cigarettes  . Smokeless  tobacco: Never Used     Comment: GAVE 1-800-QUIT-NOW  . Alcohol Use: No  . Drug Use: No  . Sexual Activity: Not on file   Other Topics Concern  . Not on file   Social History Narrative  . No narrative on file     BP 115/69  Pulse 70  Ht 5\' 10"  (1.778 m)  Wt 246 lb 9.6 oz (111.857 kg)  BMI 35.38 kg/m2  Physical Exam:  Well appearing obese, middle-aged man,NAD HEENT: Unremarkable Neck:  7 cm JVD, no thyromegally Back:  No CVA tenderness Lungs:  Clear except for rales in the bases bilaterally. No wheezes or rhonchi, and no increased work of breathing. Well-healed ICD incision. HEART:  Regular rate rhythm, no murmurs, no rubs, no clicks Abd:  soft, positive bowel sounds, no organomegally, no rebound, no guarding Ext:  2 plus pulses, no edema, no cyanosis, no clubbing Skin:  No rashes no nodules Neuro:  CN II through XII intact, motor grossly intact  EKG - normal sinus rhythm with nonspecific T wave abnormality.  DEVICE  Normal device function.  See PaceArt for details.   Assess/Plan:

## 2013-10-07 NOTE — Patient Instructions (Addendum)
Your physician wants you to follow-up in: one year with Dr. Ladona Ridgel.  You will receive a reminder letter in the mail two months in advance. If you don't receive a letter, please call our office to schedule the follow-up appointment.

## 2013-10-08 LAB — ICD DEVICE OBSERVATION
FVT: 0
PACEART VT: 0
RV LEAD AMPLITUDE: 8.5 mv
TZAT-0004SLOWVT: 8
TZAT-0004SLOWVT: 8
TZAT-0005FASTVT: 88 pct
TZAT-0005SLOWVT: 84 pct
TZAT-0005SLOWVT: 91 pct
TZAT-0011FASTVT: 10 ms
TZAT-0012SLOWVT: 200 ms
TZAT-0012SLOWVT: 200 ms
TZAT-0013SLOWVT: 2
TZAT-0013SLOWVT: 2
TZAT-0018FASTVT: NEGATIVE
TZON-0003SLOWVT: 340 ms
TZON-0004SLOWVT: 28
TZON-0011AFLUTTER: 70
TZST-0001FASTVT: 3
TZST-0001FASTVT: 5
TZST-0001SLOWVT: 3
TZST-0001SLOWVT: 6
TZST-0003FASTVT: 35 J
TZST-0003SLOWVT: 15 J
TZST-0003SLOWVT: 25 J
VENTRICULAR PACING ICD: 0 pct
VF: 0

## 2013-10-28 ENCOUNTER — Other Ambulatory Visit: Payer: Self-pay

## 2013-10-29 ENCOUNTER — Encounter: Payer: Self-pay | Admitting: Internal Medicine

## 2013-10-29 ENCOUNTER — Ambulatory Visit (INDEPENDENT_AMBULATORY_CARE_PROVIDER_SITE_OTHER): Payer: BC Managed Care – PPO | Admitting: Internal Medicine

## 2013-10-29 VITALS — BP 130/80 | HR 72 | Temp 97.8°F | Ht 70.0 in | Wt 246.0 lb

## 2013-10-29 DIAGNOSIS — Z23 Encounter for immunization: Secondary | ICD-10-CM | POA: Diagnosis not present

## 2013-10-29 DIAGNOSIS — E785 Hyperlipidemia, unspecified: Secondary | ICD-10-CM

## 2013-10-29 DIAGNOSIS — E538 Deficiency of other specified B group vitamins: Secondary | ICD-10-CM

## 2013-10-29 DIAGNOSIS — E1149 Type 2 diabetes mellitus with other diabetic neurological complication: Secondary | ICD-10-CM | POA: Diagnosis not present

## 2013-10-29 DIAGNOSIS — E1159 Type 2 diabetes mellitus with other circulatory complications: Secondary | ICD-10-CM | POA: Diagnosis not present

## 2013-10-29 DIAGNOSIS — E1151 Type 2 diabetes mellitus with diabetic peripheral angiopathy without gangrene: Secondary | ICD-10-CM

## 2013-10-29 DIAGNOSIS — I798 Other disorders of arteries, arterioles and capillaries in diseases classified elsewhere: Secondary | ICD-10-CM

## 2013-10-29 DIAGNOSIS — E1142 Type 2 diabetes mellitus with diabetic polyneuropathy: Secondary | ICD-10-CM

## 2013-10-29 DIAGNOSIS — Z0000 Encounter for general adult medical examination without abnormal findings: Secondary | ICD-10-CM | POA: Diagnosis not present

## 2013-10-29 DIAGNOSIS — I5022 Chronic systolic (congestive) heart failure: Secondary | ICD-10-CM

## 2013-10-29 LAB — BASIC METABOLIC PANEL
BUN: 10 mg/dL (ref 6–23)
CO2: 29 mEq/L (ref 19–32)
Calcium: 9.5 mg/dL (ref 8.4–10.5)
Creatinine, Ser: 0.8 mg/dL (ref 0.4–1.5)
Glucose, Bld: 126 mg/dL — ABNORMAL HIGH (ref 70–99)
Potassium: 4.3 mEq/L (ref 3.5–5.1)

## 2013-10-29 LAB — HEPATIC FUNCTION PANEL
Alkaline Phosphatase: 73 U/L (ref 39–117)
Bilirubin, Direct: 0 mg/dL (ref 0.0–0.3)
Total Bilirubin: 0.7 mg/dL (ref 0.3–1.2)
Total Protein: 7.7 g/dL (ref 6.0–8.3)

## 2013-10-29 LAB — LIPID PANEL
Cholesterol: 140 mg/dL (ref 0–200)
Total CHOL/HDL Ratio: 4
VLDL: 46.6 mg/dL — ABNORMAL HIGH (ref 0.0–40.0)

## 2013-10-29 LAB — CBC WITH DIFFERENTIAL/PLATELET
Basophils Absolute: 0.1 10*3/uL (ref 0.0–0.1)
Eosinophils Absolute: 0.1 10*3/uL (ref 0.0–0.7)
Lymphocytes Relative: 35.4 % (ref 12.0–46.0)
MCHC: 34.9 g/dL (ref 30.0–36.0)
Monocytes Absolute: 0.5 10*3/uL (ref 0.1–1.0)
Neutro Abs: 5.7 10*3/uL (ref 1.4–7.7)
Neutrophils Relative %: 57.9 % (ref 43.0–77.0)
Platelets: 288 10*3/uL (ref 150.0–400.0)
RDW: 13.5 % (ref 11.5–14.6)
WBC: 9.8 10*3/uL (ref 4.5–10.5)

## 2013-10-29 LAB — LDL CHOLESTEROL, DIRECT: Direct LDL: 57.3 mg/dL

## 2013-10-29 LAB — TSH: TSH: 0.84 u[IU]/mL (ref 0.35–5.50)

## 2013-10-29 LAB — T4, FREE: Free T4: 0.81 ng/dL (ref 0.60–1.60)

## 2013-10-29 NOTE — Assessment & Plan Note (Signed)
Dual therapy Needs labs today

## 2013-10-29 NOTE — Assessment & Plan Note (Signed)
Compensated Neutral fluid status

## 2013-10-29 NOTE — Progress Notes (Signed)
Subjective:    Patient ID: [name], male    DOB: [birth year], 57 y.o.   MRN: [id number]  HPI Here for physical Reviewed advanced directives  Checks sugars rarely No problems lately Can't afford eye exam--needs to find one that takes his insurance  Stamina is still better than in the past Always tired-- does yard work but has to rest frequently  Current Outpatient Prescriptions on File Prior to Visit  Medication Sig Dispense Refill  . albuterol (PROVENTIL HFA;VENTOLIN HFA) 108 (90 BASE) MCG/ACT inhaler Inhale 2 puffs into the lungs 4 (four) times daily as needed.  18 g  3  . albuterol (PROVENTIL) (5 MG/ML) 0.5% nebulizer solution Take 0.5 mLs (2.5 mg total) by nebulization every 2 (two) hours as needed for wheezing or shortness of breath.  20 mL  0  . aspirin 81 MG tablet Take 81 mg by mouth daily.        Marland Kitchen atorvastatin (LIPITOR) 10 MG tablet Take 1 tablet (10 mg total) by mouth daily at 6 PM.  30 tablet  11  . bisoprolol (ZEBETA) 10 MG tablet Take 1 tablet (10 mg total) by mouth daily.  30 tablet  2  . clopidogrel (PLAVIX) 75 MG tablet Take 1 tablet (75 mg total) by mouth daily.  90 tablet  3  . enalapril (VASOTEC) 20 MG tablet Take 1 tablet (20 mg total) by mouth daily.  30 tablet  2  . gemfibrozil (LOPID) 600 MG tablet Take 1 tablet (600 mg total) by mouth 2 (two) times daily before a meal.  60 tablet  6  . isosorbide mononitrate (ISMO,MONOKET) 20 MG tablet Take 1 tablet (20 mg total) by mouth 2 (two) times daily.  60 tablet  5  . multivitamin (THERAGRAN) per tablet Take 1 tablet by mouth daily.        Marland Kitchen tiotropium (SPIRIVA HANDIHALER) 18 MCG inhalation capsule Place 1 capsule (18 mcg total) into inhaler and inhale daily.  30 capsule  0  . vitamin B-12 (CYANOCOBALAMIN) 1000 MCG tablet Take 1,000 mcg by mouth daily.         No current facility-administered medications on file prior to visit.    No Known Allergies  Past Medical History  Diagnosis Date  . Diabetes mellitus      type 2  . GERD (gastroesophageal reflux disease)     with esophagitis  . PVD (peripheral vascular disease)     bilateral common iliac artery aneurysms. Right SFA occlusion over a long segment. Left SFA disease with occlusion of the left TP trunk. Artirogram Oct. 2006  . History of colonic polyps   . Diverticulosis of colon   . Sleep apnea   . Non-ischemic cardiomyopathy   . Coronary artery disease      LAD a 60-70% stenosis, moderate size second diagonal with 50% stenosis, circumflex AV groove 75-80% stenosis with a branching obtuse marginal with a superior branch subtotal stenosis followed by diffuse disease, right coronary artery  had nonobstructive disease.  EF was 35%  . COPD (chronic obstructive pulmonary disease)   . Tobacco abuse   . Urinary incontinence     detrussor instability    Past Surgical History  Procedure Laterality Date  . S/p icd placement       Medtronic Maximo [phone] single chamber  . Colonoscopy  04/2005  . Cardiac catheterization  06/2004    negative  . Pacemaker insertion  11/2005  . Copd exacerbation      Family History  Problem Relation Age of Onset  . Stroke Father   . Coronary artery disease Maternal Aunt   . Heart failure Maternal Aunt   . Lung cancer Maternal Aunt   . Lung cancer Maternal Uncle   . Cancer Brother     Mouth  . Cancer Sister     throat    History   Social History  . Marital Status: Married    Spouse Name: N/A    Number of Children: N/A  . Years of Education: N/A   Occupational History  . Not on file.   Social History Main Topics  . Smoking status: Current Every Day Smoker -- 2.00 packs/day for 40 years    Types: Cigarettes  . Smokeless tobacco: Never Used     Comment: GAVE 1-800-QUIT-NOW  . Alcohol Use: No  . Drug Use: No  . Sexual Activity: Not on file   Other Topics Concern  . Not on file   Social History Narrative   No living will   Requests wife as health care POA   Has defibrillator but otherwise  requests DNR-- written 10/29/13   No tube feeds if cognitively unaware   Review of Systems  Constitutional: Positive for fatigue. Negative for unexpected weight change.       Weighs himself regularly Wears seat belt  HENT: Positive for hearing loss, rhinorrhea and tinnitus. Negative for congestion and dental problem.        Edentulous  Eyes: Negative for visual disturbance.       No diplopia or unilateral vision loss  Respiratory: Positive for cough and shortness of breath. Negative for chest tightness.        Counseled but he is not ready to stop smoking  Cardiovascular: Positive for palpitations. Negative for chest pain and leg swelling.       Brief speeding up of heart  Gastrointestinal: Positive for blood in stool. Negative for nausea, vomiting, abdominal pain and constipation.       Blood in stool--relates to internal hemorrhoids  Endocrine: Positive for cold intolerance. Negative for heat intolerance.  Genitourinary: Positive for frequency and difficulty urinating. Negative for urgency.  Musculoskeletal: Positive for arthralgias and back pain.       Back and foot pain  Skin: Negative for rash.       No suspicious lesions  Allergic/Immunologic: Negative for environmental allergies and immunocompromised state.  Neurological: Positive for dizziness, weakness and numbness. Negative for syncope, light-headedness and headaches.       Left arm pain, numbness and weakness. intermittent  Hematological: Negative for adenopathy. Bruises/bleeds easily.  Psychiatric/Behavioral: Positive for sleep disturbance. Negative for dysphoric mood. The patient is not nervous/anxious.        Objective:   Physical Exam  Constitutional: He is oriented to person, place, and time. He appears well-developed and well-nourished. No distress.  HENT:  Head: Normocephalic and atraumatic.  Right Ear: External ear normal.  Left Ear: External ear normal.  Mouth/Throat: Oropharynx is clear and moist. No  oropharyngeal exudate.  Eyes: Conjunctivae and EOM are normal. Pupils are equal, round, and reactive to light.  Neck: Normal range of motion. Neck supple. No thyromegaly present.  Cardiovascular: Normal rate, regular rhythm and normal heart sounds.  Exam reveals no gallop.   No murmur heard. Faint pedal pulses  Pulmonary/Chest: Effort normal and breath sounds normal. No respiratory distress. He has no wheezes. He has no rales.  Abdominal: Soft. There is no tenderness.  Musculoskeletal: He exhibits no edema and no  tenderness.  Lymphadenopathy:    He has no cervical adenopathy.  Neurological: He is alert and oriented to person, place, and time.  Decreased sensation on plantar feet--- R>L  Skin:  callouses on plantar feet No ulcers  Psychiatric: He has a normal mood and affect. His behavior is normal.          Assessment & Plan:

## 2013-10-29 NOTE — Assessment & Plan Note (Signed)
Limited by CHF, not claudication

## 2013-10-29 NOTE — Assessment & Plan Note (Signed)
UTD on imms Doesn't want cancer screening DNR done

## 2013-10-29 NOTE — Progress Notes (Signed)
Pre-visit discussion using our clinic review tool. No additional management support is needed unless otherwise documented below in the visit note.  

## 2013-10-29 NOTE — Assessment & Plan Note (Signed)
Really just sensory in feet No need for Rx

## 2013-10-29 NOTE — Assessment & Plan Note (Signed)
Hopefully still controlled Due for labs 

## 2013-10-29 NOTE — Addendum Note (Signed)
Addended by: Sueanne Margarita on: 10/29/2013 12:27 PM   Modules accepted: Orders

## 2013-11-04 ENCOUNTER — Encounter: Payer: Self-pay | Admitting: Cardiology

## 2013-11-04 ENCOUNTER — Encounter: Payer: Self-pay | Admitting: *Deleted

## 2013-11-04 ENCOUNTER — Ambulatory Visit (INDEPENDENT_AMBULATORY_CARE_PROVIDER_SITE_OTHER): Payer: BC Managed Care – PPO | Admitting: Cardiology

## 2013-11-04 VITALS — BP 140/90 | HR 74 | Ht 71.0 in | Wt 247.0 lb

## 2013-11-04 DIAGNOSIS — I658 Occlusion and stenosis of other precerebral arteries: Secondary | ICD-10-CM | POA: Diagnosis not present

## 2013-11-04 DIAGNOSIS — I6529 Occlusion and stenosis of unspecified carotid artery: Secondary | ICD-10-CM

## 2013-11-04 DIAGNOSIS — I6523 Occlusion and stenosis of bilateral carotid arteries: Secondary | ICD-10-CM

## 2013-11-04 NOTE — Progress Notes (Signed)
HPI The patient presents for follow up of CAD.   In 2012 he had a stress test indicating  anterior ischemia mild but from base to apex.  I sent him for a cath demonstrating LAD a 60-70% stenosis, moderate size second diagonal with 50% stenosis, circumflex AV groove 75-80% stenosis with a branching obtuse marginal with a superior branch subtotal stenosis followed by diffuse disease, right coronary artery  had nonobstructive disease.  EF was 35%.   He was managed medically.  Since I last saw him he has had no new cardiovascular complaints  The patient denies any new symptoms such as chest discomfort, neck or arm discomfort. There has been no new shortness of breath, PND or orthopnea. There have been no reported palpitations, presyncope or syncope.  He has lost a few pounds over the years and has been a little more active. He is gardening.  With this level of activity he denies any cardiovascular symptoms. He is tired all the time but this is not unusual.   No Known Allergies  Current Outpatient Prescriptions  Medication Sig Dispense Refill  . albuterol (PROVENTIL HFA;VENTOLIN HFA) 108 (90 BASE) MCG/ACT inhaler Inhale 2 puffs into the lungs 4 (four) times daily as needed.  18 g  3  . albuterol (PROVENTIL) (5 MG/ML) 0.5% nebulizer solution Take 0.5 mLs (2.5 mg total) by nebulization every 2 (two) hours as needed for wheezing or shortness of breath.  20 mL  0  . aspirin 81 MG tablet Take 81 mg by mouth daily.        Marland Kitchen atorvastatin (LIPITOR) 10 MG tablet Take 1 tablet (10 mg total) by mouth daily at 6 PM.  30 tablet  11  . bisoprolol (ZEBETA) 10 MG tablet Take 1 tablet (10 mg total) by mouth daily.  30 tablet  2  . clopidogrel (PLAVIX) 75 MG tablet Take 1 tablet (75 mg total) by mouth daily.  90 tablet  3  . enalapril (VASOTEC) 20 MG tablet Take 1 tablet (20 mg total) by mouth daily.  30 tablet  2  . gemfibrozil (LOPID) 600 MG tablet Take 1 tablet (600 mg total) by mouth 2 (two) times daily before a  meal.  60 tablet  6  . isosorbide mononitrate (ISMO,MONOKET) 20 MG tablet Take 1 tablet (20 mg total) by mouth 2 (two) times daily.  60 tablet  5  . multivitamin (THERAGRAN) per tablet Take 1 tablet by mouth daily.        Marland Kitchen tiotropium (SPIRIVA HANDIHALER) 18 MCG inhalation capsule Place 1 capsule (18 mcg total) into inhaler and inhale daily.  30 capsule  0  . vitamin B-12 (CYANOCOBALAMIN) 1000 MCG tablet Take 1,000 mcg by mouth daily.         No current facility-administered medications for this visit.    Past Medical History  Diagnosis Date  . Diabetes mellitus     type 2  . GERD (gastroesophageal reflux disease)     with esophagitis  . PVD (peripheral vascular disease)     bilateral common iliac artery aneurysms. Right SFA occlusion over a long segment. Left SFA disease with occlusion of the left TP trunk. Artirogram Oct. 2006  . History of colonic polyps   . Diverticulosis of colon   . Sleep apnea   . Non-ischemic cardiomyopathy   . Coronary artery disease      LAD a 60-70% stenosis, moderate size second diagonal with 50% stenosis, circumflex AV groove 75-80% stenosis with a branching obtuse  marginal with a superior branch subtotal stenosis followed by diffuse disease, right coronary artery  had nonobstructive disease.  EF was 35%  . COPD (chronic obstructive pulmonary disease)   . Tobacco abuse   . Urinary incontinence     detrussor instability    Past Surgical History  Procedure Laterality Date  . S/p icd placement       Medtronic Maximo [phone] single chamber  . Colonoscopy  04/2005  . Cardiac catheterization  06/2004    negative  . Pacemaker insertion  11/2005  . Copd exacerbation      ROS:  As stated in the HPI and negative for all other systems.  PHYSICAL EXAM BP 140/90  Pulse 74  Ht 5\' 11"  (1.803 m)  Wt 247 lb (112.038 kg)  BMI 34.46 kg/m2  SpO2 95% GENERAL:  Well appearing NECK:  No jugular venous distention, waveform within normal limits, carotid upstroke  brisk and symmetric, no bruits, no thyromegaly LUNGS:  Clear to auscultation bilaterally BACK:  No CVA tenderness CHEST: ICD pocket HEART:  PMI not displaced or sustained,S1 and S2 within normal limits, no S3, no S4, no clicks, no rubs, no murmurs ABD:  Flat, positive bowel sounds normal in frequency in pitch, no bruits, no rebound, no guarding, no midline pulsatile mass, no hepatomegaly, no splenomegaly, obese EXT:  2 plus pulses upper, absent DP/PT, no edema, no cyanosis no clubbing, right wrist OK NEURO:  Cranial nerves II through XII grossly intact, motor grossly intact throughout PSYCH:  Cognitively intact, oriented to person place and time  ASSESSMENT AND PLAN  CAD:   The patient has no new sypmtoms.  No further cardiovascular testing is indicated.  We will continue with aggressive risk reduction and meds as listed.  He can stop his Plavix.   CAROTID STENOSIS:  He is overdue for follow up of this.  I will arrange  TOBACCO ABUSE:  He will not quit smoking and we talked about this today.  CARDIOMYOPATHY:  He seems to be euvolemic.  At this point, no change in therapy is indicated.  We have reviewed salt and fluid restrictions.  No further cardiovascular testing is indicated.  DYSLIPIDEMIA:  I did review his lipid profile. His triglycerides were elevated but he was otherwise at target. He will continue on the meds as listed.

## 2013-11-04 NOTE — Patient Instructions (Addendum)
Please stop your Plavix.  Continue all other medications as listed.  Your physician has requested that you have a carotid duplex. This test is an ultrasound of the carotid arteries in your neck. It looks at blood flow through these arteries that supply the brain with blood. Allow one hour for this exam. There are no restrictions or special instructions.  Follow up in 1 year with Dr Antoine Poche.  You will receive a letter in the mail 2 months before you are due.  Please call us when you receive this letter to schedule your follow up appointment.

## 2013-11-22 ENCOUNTER — Ambulatory Visit (HOSPITAL_COMMUNITY): Payer: BC Managed Care – PPO | Attending: Cardiology

## 2013-11-22 DIAGNOSIS — I6529 Occlusion and stenosis of unspecified carotid artery: Secondary | ICD-10-CM | POA: Diagnosis not present

## 2013-11-22 DIAGNOSIS — R42 Dizziness and giddiness: Secondary | ICD-10-CM | POA: Diagnosis not present

## 2013-11-22 DIAGNOSIS — I6523 Occlusion and stenosis of bilateral carotid arteries: Secondary | ICD-10-CM

## 2013-12-01 ENCOUNTER — Other Ambulatory Visit: Payer: Self-pay

## 2013-12-01 MED ORDER — ISOSORBIDE MONONITRATE 20 MG PO TABS: 20.0000 mg | ORAL_TABLET | Freq: Two times a day (BID) | ORAL | Status: DC

## 2013-12-27 ENCOUNTER — Other Ambulatory Visit: Payer: Self-pay

## 2013-12-27 MED ORDER — BISOPROLOL FUMARATE 10 MG PO TABS: 10.0000 mg | ORAL_TABLET | Freq: Every day | ORAL | Status: DC

## 2014-01-04 ENCOUNTER — Encounter: Payer: Self-pay | Admitting: Internal Medicine

## 2014-01-04 ENCOUNTER — Ambulatory Visit (INDEPENDENT_AMBULATORY_CARE_PROVIDER_SITE_OTHER): Payer: BC Managed Care – PPO | Admitting: *Deleted

## 2014-01-04 DIAGNOSIS — Z95810 Presence of automatic (implantable) cardiac defibrillator: Secondary | ICD-10-CM

## 2014-01-04 LAB — MDC_IDC_ENUM_SESS_TYPE_INCLINIC
Date Time Interrogation Session: 20150113100523
HIGH POWER IMPEDANCE MEASURED VALUE: 49 Ohm
HIGH POWER IMPEDANCE MEASURED VALUE: 51 Ohm
HIGH POWER IMPEDANCE MEASURED VALUE: 51 Ohm
HIGH POWER IMPEDANCE MEASURED VALUE: 52 Ohm
HIGH POWER IMPEDANCE MEASURED VALUE: 53 Ohm
HIGH POWER IMPEDANCE MEASURED VALUE: 53 Ohm
HIGH POWER IMPEDANCE MEASURED VALUE: 54 Ohm
HIGH POWER IMPEDANCE MEASURED VALUE: 65 Ohm
HIGH POWER IMPEDANCE MEASURED VALUE: 67 Ohm
HIGH POWER IMPEDANCE MEASURED VALUE: 70 Ohm
HIGH POWER IMPEDANCE MEASURED VALUE: 75 Ohm
HIGH POWER IMPEDANCE MEASURED VALUE: 75 Ohm
HighPow Impedance: 46 Ohm
HighPow Impedance: 49 Ohm
HighPow Impedance: 50 Ohm
HighPow Impedance: 51 Ohm
HighPow Impedance: 51 Ohm
HighPow Impedance: 52 Ohm
HighPow Impedance: 54 Ohm
HighPow Impedance: 55 Ohm
HighPow Impedance: 56 Ohm
HighPow Impedance: 65 Ohm
HighPow Impedance: 66 Ohm
HighPow Impedance: 66 Ohm
HighPow Impedance: 66 Ohm
HighPow Impedance: 67 Ohm
HighPow Impedance: 67 Ohm
HighPow Impedance: 68 Ohm
HighPow Impedance: 70 Ohm
HighPow Impedance: 72 Ohm
HighPow Impedance: 72 Ohm
Lead Channel Impedance Value: 376 Ohm
Lead Channel Impedance Value: 376 Ohm
Lead Channel Impedance Value: 384 Ohm
Lead Channel Impedance Value: 384 Ohm
Lead Channel Impedance Value: 384 Ohm
Lead Channel Impedance Value: 384 Ohm
Lead Channel Impedance Value: 400 Ohm
Lead Channel Impedance Value: 400 Ohm
Lead Channel Pacing Threshold Pulse Width: 0.3 ms
Lead Channel Sensing Intrinsic Amplitude: 5.6 mV
Lead Channel Sensing Intrinsic Amplitude: 6.3 mV
Lead Channel Sensing Intrinsic Amplitude: 7.4 mV
Lead Channel Sensing Intrinsic Amplitude: 8 mV
Lead Channel Sensing Intrinsic Amplitude: 8.1 mV
Lead Channel Sensing Intrinsic Amplitude: 8.3 mV
Lead Channel Sensing Intrinsic Amplitude: 8.5 mV
Lead Channel Sensing Intrinsic Amplitude: 8.6 mV
Lead Channel Sensing Intrinsic Amplitude: 9.4 mV
MDC IDC MSMT BATTERY VOLTAGE: 2.64 V
MDC IDC MSMT LEADCHNL RV IMPEDANCE VALUE: 376 Ohm
MDC IDC MSMT LEADCHNL RV IMPEDANCE VALUE: 384 Ohm
MDC IDC MSMT LEADCHNL RV IMPEDANCE VALUE: 384 Ohm
MDC IDC MSMT LEADCHNL RV IMPEDANCE VALUE: 392 Ohm
MDC IDC MSMT LEADCHNL RV IMPEDANCE VALUE: 400 Ohm
MDC IDC MSMT LEADCHNL RV IMPEDANCE VALUE: 424 Ohm
MDC IDC MSMT LEADCHNL RV IMPEDANCE VALUE: 568 Ohm
MDC IDC MSMT LEADCHNL RV PACING THRESHOLD AMPLITUDE: 1.5 V
MDC IDC MSMT LEADCHNL RV SENSING INTR AMPL: 6.6 mV
MDC IDC MSMT LEADCHNL RV SENSING INTR AMPL: 7.4 mV
MDC IDC MSMT LEADCHNL RV SENSING INTR AMPL: 8 mV
MDC IDC MSMT LEADCHNL RV SENSING INTR AMPL: 8.2 mV
MDC IDC MSMT LEADCHNL RV SENSING INTR AMPL: 8.3 mV
MDC IDC MSMT LEADCHNL RV SENSING INTR AMPL: 9 mV
MDC IDC SET LEADCHNL RV PACING AMPLITUDE: 3 V
MDC IDC SET LEADCHNL RV PACING PULSEWIDTH: 0.4 ms
MDC IDC SET LEADCHNL RV SENSING SENSITIVITY: 0.3 mV
MDC IDC STAT BRADY RV PERCENT PACED: 0 %
Zone Setting Detection Interval: 240 ms
Zone Setting Detection Interval: 300 ms
Zone Setting Detection Interval: 340 ms

## 2014-01-04 NOTE — Progress Notes (Signed)
Med list not able to review---pt not sure what he's taking--pt will create & bring a list next time.  Battery check & ICD check in clinic. Normal device function. Thresholds and sensing consistent with previous device measurements. Impedance trends stable over time. No evidence of any ventricular arrhythmias. Histogram distribution appropriate for patient and level of activity. No changes made this session. Device programmed at appropriate safety margins. Device programmed to optimize intrinsic conduction. Remaining battery 2.64V, ERI = 2.62V. ROV w/ device clinic for battery only on 03/07/14 @ 4:30/billable.

## 2014-02-06 ENCOUNTER — Other Ambulatory Visit: Payer: Self-pay | Admitting: Internal Medicine

## 2014-02-08 ENCOUNTER — Other Ambulatory Visit: Payer: Self-pay

## 2014-02-08 MED ORDER — ENALAPRIL MALEATE 20 MG PO TABS: 20.0000 mg | ORAL_TABLET | Freq: Every day | ORAL | Status: DC

## 2014-03-07 ENCOUNTER — Ambulatory Visit (INDEPENDENT_AMBULATORY_CARE_PROVIDER_SITE_OTHER): Payer: BC Managed Care – PPO | Admitting: *Deleted

## 2014-03-07 DIAGNOSIS — I428 Other cardiomyopathies: Secondary | ICD-10-CM | POA: Diagnosis not present

## 2014-03-07 LAB — MDC_IDC_ENUM_SESS_TYPE_INCLINIC
Battery Voltage: 2.64 V
Brady Statistic RV Percent Paced: 0 %
HIGH POWER IMPEDANCE MEASURED VALUE: 50 Ohm
HIGH POWER IMPEDANCE MEASURED VALUE: 50 Ohm
HIGH POWER IMPEDANCE MEASURED VALUE: 51 Ohm
HIGH POWER IMPEDANCE MEASURED VALUE: 52 Ohm
HIGH POWER IMPEDANCE MEASURED VALUE: 67 Ohm
HIGH POWER IMPEDANCE MEASURED VALUE: 67 Ohm
HIGH POWER IMPEDANCE MEASURED VALUE: 68 Ohm
HIGH POWER IMPEDANCE MEASURED VALUE: 70 Ohm
HIGH POWER IMPEDANCE MEASURED VALUE: 72 Ohm
HighPow Impedance: 46 Ohm
HighPow Impedance: 50 Ohm
HighPow Impedance: 51 Ohm
HighPow Impedance: 51 Ohm
HighPow Impedance: 51 Ohm
HighPow Impedance: 52 Ohm
HighPow Impedance: 52 Ohm
HighPow Impedance: 53 Ohm
HighPow Impedance: 53 Ohm
HighPow Impedance: 54 Ohm
HighPow Impedance: 54 Ohm
HighPow Impedance: 57 Ohm
HighPow Impedance: 66 Ohm
HighPow Impedance: 67 Ohm
HighPow Impedance: 67 Ohm
HighPow Impedance: 67 Ohm
HighPow Impedance: 67 Ohm
HighPow Impedance: 67 Ohm
HighPow Impedance: 67 Ohm
HighPow Impedance: 68 Ohm
HighPow Impedance: 68 Ohm
HighPow Impedance: 74 Ohm
Lead Channel Impedance Value: 376 Ohm
Lead Channel Impedance Value: 384 Ohm
Lead Channel Impedance Value: 384 Ohm
Lead Channel Impedance Value: 384 Ohm
Lead Channel Impedance Value: 384 Ohm
Lead Channel Impedance Value: 400 Ohm
Lead Channel Impedance Value: 400 Ohm
Lead Channel Impedance Value: 432 Ohm
Lead Channel Sensing Intrinsic Amplitude: 6.2 mV
Lead Channel Sensing Intrinsic Amplitude: 6.3 mV
Lead Channel Sensing Intrinsic Amplitude: 7.3 mV
Lead Channel Sensing Intrinsic Amplitude: 7.6 mV
Lead Channel Sensing Intrinsic Amplitude: 8.3 mV
Lead Channel Sensing Intrinsic Amplitude: 8.4 mV
Lead Channel Sensing Intrinsic Amplitude: 8.4 mV
Lead Channel Sensing Intrinsic Amplitude: 8.4 mV
Lead Channel Sensing Intrinsic Amplitude: 8.5 mV
Lead Channel Setting Pacing Pulse Width: 0.4 ms
Lead Channel Setting Sensing Sensitivity: 0.3 mV
MDC IDC MSMT LEADCHNL RV IMPEDANCE VALUE: 400 Ohm
MDC IDC MSMT LEADCHNL RV IMPEDANCE VALUE: 400 Ohm
MDC IDC MSMT LEADCHNL RV IMPEDANCE VALUE: 408 Ohm
MDC IDC MSMT LEADCHNL RV IMPEDANCE VALUE: 416 Ohm
MDC IDC MSMT LEADCHNL RV IMPEDANCE VALUE: 416 Ohm
MDC IDC MSMT LEADCHNL RV IMPEDANCE VALUE: 456 Ohm
MDC IDC MSMT LEADCHNL RV IMPEDANCE VALUE: 472 Ohm
MDC IDC MSMT LEADCHNL RV SENSING INTR AMPL: 7.3 mV
MDC IDC MSMT LEADCHNL RV SENSING INTR AMPL: 7.7 mV
MDC IDC MSMT LEADCHNL RV SENSING INTR AMPL: 7.9 mV
MDC IDC MSMT LEADCHNL RV SENSING INTR AMPL: 8.5 mV
MDC IDC MSMT LEADCHNL RV SENSING INTR AMPL: 8.6 mV
MDC IDC SESS DTM: 20150316165906
MDC IDC SET LEADCHNL RV PACING AMPLITUDE: 3 V
MDC IDC SET ZONE DETECTION INTERVAL: 240 ms
Zone Setting Detection Interval: 300 ms
Zone Setting Detection Interval: 340 ms

## 2014-03-07 NOTE — Progress Notes (Signed)
ICD check in clinic. Normal device function. Thresholds and sensing consistent with previous device measurements. [phone] lead stable at this time. SIC 0. Impedance trends stable over time. No evidence of any ventricular arrhythmias. Histogram distribution appropriate for patient and level of activity. No changes made this session. Device programmed at appropriate safety margins. Device programmed to optimize intrinsic conduction. Battery voltage 2.64 V. Alert tones/vibration demonstrated for patient and pt aware to contact office if heard. ROV in 3 mths w/device clinic.

## 2014-03-17 ENCOUNTER — Encounter: Payer: Self-pay | Admitting: Internal Medicine

## 2014-04-21 ENCOUNTER — Ambulatory Visit (INDEPENDENT_AMBULATORY_CARE_PROVIDER_SITE_OTHER): Payer: BC Managed Care – PPO | Admitting: Internal Medicine

## 2014-04-21 ENCOUNTER — Encounter: Payer: Self-pay | Admitting: Internal Medicine

## 2014-04-21 VITALS — BP 140/80 | HR 90 | Temp 98.0°F | Wt 252.0 lb

## 2014-04-21 DIAGNOSIS — E1159 Type 2 diabetes mellitus with other circulatory complications: Secondary | ICD-10-CM | POA: Diagnosis not present

## 2014-04-21 DIAGNOSIS — H6091 Unspecified otitis externa, right ear: Secondary | ICD-10-CM | POA: Insufficient documentation

## 2014-04-21 DIAGNOSIS — H60399 Other infective otitis externa, unspecified ear: Secondary | ICD-10-CM

## 2014-04-21 MED ORDER — NEOMYCIN-COLIST-HC-THONZONIUM 3.3-3-10-0.5 MG/ML OT SUSP: 4.0000 [drp] | Freq: Four times a day (QID) | OTIC | Status: DC

## 2014-04-21 NOTE — Progress Notes (Signed)
Subjective:    Patient ID: [name], male    DOB: May 28, 1956, 58 y.o.   MRN: [id number]  HPI Ears were hurting pretty bad Can't hear out of right side---kept him up last night started 3-4 weeks ago. Eased up then started again  Noticed after water getting in ear after tub bath No discharge  No fever Some cold symptoms---felt chilled Some rhinorrhea and cough.  Some mucus at times No SOB  Tried wax cleaner--didn't help  Current Outpatient Prescriptions on File Prior to Visit  Medication Sig Dispense Refill  . albuterol (PROVENTIL HFA;VENTOLIN HFA) 108 (90 BASE) MCG/ACT inhaler Inhale 2 puffs into the lungs 4 (four) times daily as needed.  18 g  3  . albuterol (PROVENTIL) (5 MG/ML) 0.5% nebulizer solution Take 0.5 mLs (2.5 mg total) by nebulization every 2 (two) hours as needed for wheezing or shortness of breath.  20 mL  0  . aspirin 81 MG tablet Take 81 mg by mouth daily.        Marland Kitchen atorvastatin (LIPITOR) 10 MG tablet TAKE ONE (1) TABLET BY MOUTH EVERY DAY AT 6 IN THE EVENING  30 tablet  11  . bisoprolol (ZEBETA) 10 MG tablet Take 1 tablet (10 mg total) by mouth daily.  30 tablet  2  . enalapril (VASOTEC) 20 MG tablet Take 1 tablet (20 mg total) by mouth daily.  30 tablet  9  . gemfibrozil (LOPID) 600 MG tablet Take 1 tablet (600 mg total) by mouth 2 (two) times daily before a meal.  60 tablet  6  . isosorbide mononitrate (ISMO,MONOKET) 20 MG tablet Take 1 tablet (20 mg total) by mouth 2 (two) times daily.  60 tablet  5  . multivitamin (THERAGRAN) per tablet Take 1 tablet by mouth daily.        Marland Kitchen tiotropium (SPIRIVA HANDIHALER) 18 MCG inhalation capsule Place 1 capsule (18 mcg total) into inhaler and inhale daily.  30 capsule  0  . vitamin B-12 (CYANOCOBALAMIN) 1000 MCG tablet Take 1,000 mcg by mouth daily.         No current facility-administered medications on file prior to visit.    No Known Allergies  Past Medical History  Diagnosis Date  . Diabetes mellitus    type 2  . GERD (gastroesophageal reflux disease)     with esophagitis  . PVD (peripheral vascular disease)     bilateral common iliac artery aneurysms. Right SFA occlusion over a long segment. Left SFA disease with occlusion of the left TP trunk. Artirogram Oct. 2006  . History of colonic polyps   . Diverticulosis of colon   . Sleep apnea   . Non-ischemic cardiomyopathy   . Coronary artery disease      LAD a 60-70% stenosis, moderate size second diagonal with 50% stenosis, circumflex AV groove 75-80% stenosis with a branching obtuse marginal with a superior branch subtotal stenosis followed by diffuse disease, right coronary artery  had nonobstructive disease.  EF was 35%  . COPD (chronic obstructive pulmonary disease)   . Tobacco abuse   . Urinary incontinence     detrussor instability    Past Surgical History  Procedure Laterality Date  . S/p icd placement       Medtronic Maximo [phone] single chamber  . Colonoscopy  04/2005  . Cardiac catheterization  06/2004    negative  . Pacemaker insertion  11/2005  . Copd exacerbation      Family History  Problem Relation Age of  Onset  . Stroke Father   . Coronary artery disease Maternal Aunt   . Heart failure Maternal Aunt   . Lung cancer Maternal Aunt   . Lung cancer Maternal Uncle   . Cancer Brother     Mouth  . Cancer Sister     throat    History   Social History  . Marital Status: Married    Spouse Name: N/A    Number of Children: N/A  . Years of Education: N/A   Occupational History  . Not on file.   Social History Main Topics  . Smoking status: Current Every Day Smoker -- 2.00 packs/day for 40 years    Types: Cigarettes  . Smokeless tobacco: Never Used     Comment: GAVE 1-800-QUIT-NOW  . Alcohol Use: No  . Drug Use: No  . Sexual Activity: Not on file   Other Topics Concern  . Not on file   Social History Narrative   No living will   Requests wife as health care POA   Has defibrillator but otherwise  requests DNR-- written 10/29/13   No tube feeds if cognitively unaware   Review of Systems Pain with chewing--right TMJ Appetite is fine    Objective:   Physical Exam  Constitutional: He appears well-developed and well-nourished. No distress.  HENT:  Mouth/Throat: Oropharynx is clear and moist. No oropharyngeal exudate.  Moderate nasal congestion Left TM and canal normal Right tragal and pinna tenderness. Swelling in canal and very tender to speculum  Neck: Normal range of motion. Neck supple.  Lymphadenopathy:    He has no cervical adenopathy.          Assessment & Plan:

## 2014-04-21 NOTE — Progress Notes (Signed)
Pre visit review using our clinic review tool, if applicable. No additional management support is needed unless otherwise documented below in the visit note. 

## 2014-04-21 NOTE — Assessment & Plan Note (Signed)
Rx for drops Discussed trying ear plugs in bath once he is better

## 2014-04-21 NOTE — Assessment & Plan Note (Signed)
Doesn't check sugars Will recheck A1c

## 2014-04-22 ENCOUNTER — Other Ambulatory Visit: Payer: Self-pay | Admitting: *Deleted

## 2014-04-22 LAB — HEMOGLOBIN A1C: Hgb A1c MFr Bld: 5.7 % (ref 4.6–6.5)

## 2014-04-22 NOTE — Telephone Encounter (Signed)
[facility] calling stating ear drops are $160.00, can something else be called in?

## 2014-04-22 NOTE — Telephone Encounter (Signed)
Call them He needs steroid ear drops Have them fill most affordable (?other cortisporin less money?)

## 2014-04-23 ENCOUNTER — Other Ambulatory Visit: Payer: Self-pay | Admitting: Cardiology

## 2014-04-23 NOTE — Telephone Encounter (Signed)
Did no one handle this on Friday Please check what happened

## 2014-04-25 MED ORDER — NEOMYCIN-POLYMYXIN-HC 3.5-10000-1 OT SOLN: 4.0000 [drp] | Freq: Four times a day (QID) | OTIC | Status: DC

## 2014-04-25 NOTE — Telephone Encounter (Signed)
rx sent to pharmacy by e-script Spoke with rob and corrected the rx

## 2014-04-28 ENCOUNTER — Ambulatory Visit: Payer: BC Managed Care – PPO | Admitting: Internal Medicine

## 2014-05-05 ENCOUNTER — Emergency Department (HOSPITAL_COMMUNITY): Payer: BC Managed Care – PPO

## 2014-05-05 ENCOUNTER — Encounter (HOSPITAL_COMMUNITY): Payer: Self-pay | Admitting: Emergency Medicine

## 2014-05-05 ENCOUNTER — Observation Stay (HOSPITAL_COMMUNITY)
Admission: EM | Admit: 2014-05-05 | Discharge: 2014-05-06 | Disposition: A | Discharge status: Home / Self Care | Payer: BC Managed Care – PPO | Attending: Internal Medicine | Admitting: Internal Medicine

## 2014-05-05 DIAGNOSIS — I428 Other cardiomyopathies: Secondary | ICD-10-CM | POA: Diagnosis not present

## 2014-05-05 DIAGNOSIS — I959 Hypotension, unspecified: Secondary | ICD-10-CM | POA: Diagnosis not present

## 2014-05-05 DIAGNOSIS — I5022 Chronic systolic (congestive) heart failure: Secondary | ICD-10-CM | POA: Diagnosis not present

## 2014-05-05 DIAGNOSIS — J449 Chronic obstructive pulmonary disease, unspecified: Secondary | ICD-10-CM | POA: Diagnosis not present

## 2014-05-05 DIAGNOSIS — R0989 Other specified symptoms and signs involving the circulatory and respiratory systems: Secondary | ICD-10-CM

## 2014-05-05 DIAGNOSIS — G4733 Obstructive sleep apnea (adult) (pediatric): Secondary | ICD-10-CM

## 2014-05-05 DIAGNOSIS — E1159 Type 2 diabetes mellitus with other circulatory complications: Secondary | ICD-10-CM | POA: Diagnosis not present

## 2014-05-05 DIAGNOSIS — E785 Hyperlipidemia, unspecified: Secondary | ICD-10-CM

## 2014-05-05 DIAGNOSIS — I951 Orthostatic hypotension: Secondary | ICD-10-CM | POA: Diagnosis not present

## 2014-05-05 DIAGNOSIS — R55 Syncope and collapse: Secondary | ICD-10-CM

## 2014-05-05 DIAGNOSIS — I658 Occlusion and stenosis of other precerebral arteries: Secondary | ICD-10-CM | POA: Insufficient documentation

## 2014-05-05 DIAGNOSIS — K219 Gastro-esophageal reflux disease without esophagitis: Secondary | ICD-10-CM

## 2014-05-05 DIAGNOSIS — Z7982 Long term (current) use of aspirin: Secondary | ICD-10-CM | POA: Insufficient documentation

## 2014-05-05 DIAGNOSIS — R079 Chest pain, unspecified: Secondary | ICD-10-CM

## 2014-05-05 DIAGNOSIS — R32 Unspecified urinary incontinence: Secondary | ICD-10-CM

## 2014-05-05 DIAGNOSIS — K644 Residual hemorrhoidal skin tags: Secondary | ICD-10-CM

## 2014-05-05 DIAGNOSIS — I6529 Occlusion and stenosis of unspecified carotid artery: Secondary | ICD-10-CM | POA: Insufficient documentation

## 2014-05-05 DIAGNOSIS — E1151 Type 2 diabetes mellitus with diabetic peripheral angiopathy without gangrene: Secondary | ICD-10-CM

## 2014-05-05 DIAGNOSIS — J439 Emphysema, unspecified: Secondary | ICD-10-CM | POA: Diagnosis present

## 2014-05-05 DIAGNOSIS — S0993XA Unspecified injury of face, initial encounter: Secondary | ICD-10-CM | POA: Diagnosis not present

## 2014-05-05 DIAGNOSIS — Z0000 Encounter for general adult medical examination without abnormal findings: Secondary | ICD-10-CM

## 2014-05-05 DIAGNOSIS — Z860100 Personal history of colon polyps, unspecified: Secondary | ICD-10-CM

## 2014-05-05 DIAGNOSIS — F17200 Nicotine dependence, unspecified, uncomplicated: Secondary | ICD-10-CM | POA: Diagnosis not present

## 2014-05-05 DIAGNOSIS — E669 Obesity, unspecified: Secondary | ICD-10-CM

## 2014-05-05 DIAGNOSIS — S199XXA Unspecified injury of neck, initial encounter: Secondary | ICD-10-CM | POA: Diagnosis not present

## 2014-05-05 DIAGNOSIS — L570 Actinic keratosis: Secondary | ICD-10-CM

## 2014-05-05 DIAGNOSIS — K5730 Diverticulosis of large intestine without perforation or abscess without bleeding: Secondary | ICD-10-CM

## 2014-05-05 DIAGNOSIS — J4489 Other specified chronic obstructive pulmonary disease: Secondary | ICD-10-CM | POA: Diagnosis not present

## 2014-05-05 DIAGNOSIS — Z72821 Inadequate sleep hygiene: Secondary | ICD-10-CM

## 2014-05-05 DIAGNOSIS — I798 Other disorders of arteries, arterioles and capillaries in diseases classified elsewhere: Secondary | ICD-10-CM | POA: Insufficient documentation

## 2014-05-05 DIAGNOSIS — R42 Dizziness and giddiness: Secondary | ICD-10-CM | POA: Diagnosis not present

## 2014-05-05 DIAGNOSIS — Z95810 Presence of automatic (implantable) cardiac defibrillator: Secondary | ICD-10-CM | POA: Diagnosis not present

## 2014-05-05 DIAGNOSIS — K2100 Gastro-esophageal reflux disease with esophagitis, without bleeding: Secondary | ICD-10-CM

## 2014-05-05 DIAGNOSIS — M542 Cervicalgia: Secondary | ICD-10-CM | POA: Diagnosis not present

## 2014-05-05 DIAGNOSIS — I2510 Atherosclerotic heart disease of native coronary artery without angina pectoris: Secondary | ICD-10-CM | POA: Diagnosis not present

## 2014-05-05 DIAGNOSIS — M62838 Other muscle spasm: Secondary | ICD-10-CM

## 2014-05-05 DIAGNOSIS — Z720 Tobacco use: Secondary | ICD-10-CM

## 2014-05-05 DIAGNOSIS — E1142 Type 2 diabetes mellitus with diabetic polyneuropathy: Secondary | ICD-10-CM

## 2014-05-05 DIAGNOSIS — R001 Bradycardia, unspecified: Secondary | ICD-10-CM

## 2014-05-05 DIAGNOSIS — J441 Chronic obstructive pulmonary disease with (acute) exacerbation: Secondary | ICD-10-CM

## 2014-05-05 DIAGNOSIS — I25119 Atherosclerotic heart disease of native coronary artery with unspecified angina pectoris: Secondary | ICD-10-CM | POA: Diagnosis present

## 2014-05-05 DIAGNOSIS — H6091 Unspecified otitis externa, right ear: Secondary | ICD-10-CM

## 2014-05-05 DIAGNOSIS — E538 Deficiency of other specified B group vitamins: Secondary | ICD-10-CM

## 2014-05-05 LAB — CBC
HCT: 41.1 % (ref 39.0–52.0)
HEMATOCRIT: 40.2 % (ref 39.0–52.0)
HEMOGLOBIN: 14.4 g/dL (ref 13.0–17.0)
Hemoglobin: 14.7 g/dL (ref 13.0–17.0)
MCH: 29.3 pg (ref 26.0–34.0)
MCH: 29.4 pg (ref 26.0–34.0)
MCHC: 35.8 g/dL (ref 30.0–36.0)
MCHC: 35.8 g/dL (ref 30.0–36.0)
MCV: 81.9 fL (ref 78.0–100.0)
MCV: 82 fL (ref 78.0–100.0)
Platelets: 265 10*3/uL (ref 150–400)
Platelets: 227 10*3/uL (ref 150–400)
RBC: 4.9 MIL/uL (ref 4.22–5.81)
RBC: 5.02 MIL/uL (ref 4.22–5.81)
RDW: 13.3 % (ref 11.5–15.5)
RDW: 13.4 % (ref 11.5–15.5)
WBC: 6.7 10*3/uL (ref 4.0–10.5)
WBC: 8.2 10*3/uL (ref 4.0–10.5)

## 2014-05-05 LAB — BASIC METABOLIC PANEL
BUN: 14 mg/dL (ref 6–23)
CHLORIDE: 98 meq/L (ref 96–112)
CO2: 25 mEq/L (ref 19–32)
CREATININE: 0.85 mg/dL (ref 0.50–1.35)
Calcium: 8.9 mg/dL (ref 8.4–10.5)
GFR calc non Af Amer: >90 mL/min (ref >90)
Glucose, Bld: 129 mg/dL — ABNORMAL HIGH (ref 70–99)
Potassium: 4.3 mEq/L (ref 3.7–5.3)
Sodium: 135 mEq/L — ABNORMAL LOW (ref 137–147)

## 2014-05-05 LAB — TROPONIN I
Troponin I: <0.3 ng/mL (ref <0.30)
Troponin I: <0.3 ng/mL (ref <0.30)

## 2014-05-05 LAB — I-STAT CHEM 8, ED
BUN: 13 mg/dL (ref 6–23)
Calcium, Ion: 1.13 mmol/L (ref 1.12–1.23)
Chloride: 100 mEq/L (ref 96–112)
Creatinine, Ser: 0.9 mg/dL (ref 0.50–1.35)
Glucose, Bld: 131 mg/dL — ABNORMAL HIGH (ref 70–99)
HCT: 44 % (ref 39.0–52.0)
Hemoglobin: 15 g/dL (ref 13.0–17.0)
Potassium: 4 mEq/L (ref 3.7–5.3)
Sodium: 136 mEq/L — ABNORMAL LOW (ref 137–147)
TCO2: 25 mmol/L (ref 0–100)

## 2014-05-05 LAB — GLUCOSE, CAPILLARY
GLUCOSE-CAPILLARY: 80 mg/dL (ref 70–99)
GLUCOSE-CAPILLARY: 75 mg/dL (ref 70–99)
Glucose-Capillary: 82 mg/dL (ref 70–99)
Glucose-Capillary: 92 mg/dL (ref 70–99)

## 2014-05-05 LAB — CREATININE, SERUM
Creatinine, Ser: 0.67 mg/dL (ref 0.50–1.35)
GFR calc Af Amer: >90 mL/min (ref >90)

## 2014-05-05 LAB — URINALYSIS, ROUTINE W REFLEX MICROSCOPIC
BILIRUBIN URINE: NEGATIVE
GLUCOSE, UA: NEGATIVE mg/dL
HGB URINE DIPSTICK: NEGATIVE
KETONES UR: NEGATIVE mg/dL
Leukocytes, UA: NEGATIVE
Nitrite: NEGATIVE
PROTEIN: NEGATIVE mg/dL
Specific Gravity, Urine: 1.008 (ref 1.005–1.030)
Urobilinogen, UA: 1 mg/dL (ref 0.0–1.0)
pH: 6.5 (ref 5.0–8.0)

## 2014-05-05 MED ORDER — SODIUM CHLORIDE 0.9 % IV SOLN
INTRAVENOUS | Status: AC
  Administered 2014-05-05: 07:00:00 via INTRAVENOUS

## 2014-05-05 MED ORDER — ACETAMINOPHEN 650 MG RE SUPP: 650.0000 mg | Freq: Four times a day (QID) | RECTAL | Status: DC | PRN

## 2014-05-05 MED ORDER — ASPIRIN 81 MG PO CHEW
81.0000 mg | CHEWABLE_TABLET | Freq: Every day | ORAL | Status: DC
  Administered 2014-05-05 – 2014-05-06 (×2): 81 mg via ORAL
  Filled 2014-05-05 (×2): qty 1

## 2014-05-05 MED ORDER — ATORVASTATIN CALCIUM 10 MG PO TABS
10.0000 mg | ORAL_TABLET | Freq: Every evening | ORAL | Status: DC
  Administered 2014-05-05 – 2014-05-06 (×2): 10 mg via ORAL
  Filled 2014-05-05 (×2): qty 1

## 2014-05-05 MED ORDER — SODIUM CHLORIDE 0.9 % IV BOLUS (SEPSIS)
1000.0000 mL | Freq: Once | INTRAVENOUS | Status: AC
  Administered 2014-05-05: 1000 mL via INTRAVENOUS

## 2014-05-05 MED ORDER — ACETAMINOPHEN 325 MG PO TABS: 650.0000 mg | ORAL_TABLET | Freq: Four times a day (QID) | ORAL | Status: DC | PRN

## 2014-05-05 MED ORDER — ISOSORBIDE MONONITRATE 20 MG PO TABS
20.0000 mg | ORAL_TABLET | Freq: Two times a day (BID) | ORAL | Status: DC
  Administered 2014-05-05 – 2014-05-06 (×3): 20 mg via ORAL
  Filled 2014-05-05 (×4): qty 1

## 2014-05-05 MED ORDER — ALBUTEROL SULFATE HFA 108 (90 BASE) MCG/ACT IN AERS: 2.0000 | INHALATION_SPRAY | Freq: Four times a day (QID) | RESPIRATORY_TRACT | Status: DC | PRN

## 2014-05-05 MED ORDER — IPRATROPIUM BROMIDE 0.02 % IN SOLN
0.5000 mg | Freq: Four times a day (QID) | RESPIRATORY_TRACT | Status: DC
Start: 2014-05-05 — End: 2014-05-06
  Administered 2014-05-05 (×4): 0.5 mg via RESPIRATORY_TRACT
  Filled 2014-05-05 (×4): qty 2.5

## 2014-05-05 MED ORDER — VITAMIN B-12 1000 MCG PO TABS
1000.0000 ug | ORAL_TABLET | Freq: Every day | ORAL | Status: DC
  Administered 2014-05-05 – 2014-05-06 (×2): 1000 ug via ORAL
  Filled 2014-05-05 (×2): qty 1

## 2014-05-05 MED ORDER — INSULIN ASPART 100 UNIT/ML ~~LOC~~ SOLN: 0.0000 [IU] | Freq: Every day | SUBCUTANEOUS | Status: DC

## 2014-05-05 MED ORDER — ALBUTEROL SULFATE (2.5 MG/3ML) 0.083% IN NEBU: 2.5000 mg | INHALATION_SOLUTION | RESPIRATORY_TRACT | Status: DC | PRN

## 2014-05-05 MED ORDER — DOCUSATE SODIUM 100 MG PO CAPS
100.0000 mg | ORAL_CAPSULE | Freq: Two times a day (BID) | ORAL | Status: DC
  Administered 2014-05-05 – 2014-05-06 (×2): 100 mg via ORAL
  Filled 2014-05-05 (×3): qty 1

## 2014-05-05 MED ORDER — SODIUM CHLORIDE 0.9 % IJ SOLN
3.0000 mL | Freq: Two times a day (BID) | INTRAMUSCULAR | Status: DC
  Administered 2014-05-05 – 2014-05-06 (×2): 3 mL via INTRAVENOUS

## 2014-05-05 MED ORDER — HYDROCODONE-ACETAMINOPHEN 5-325 MG PO TABS: 1.0000 | ORAL_TABLET | ORAL | Status: DC | PRN

## 2014-05-05 MED ORDER — ONDANSETRON HCL 4 MG/2ML IJ SOLN: 4.0000 mg | Freq: Four times a day (QID) | INTRAMUSCULAR | Status: DC | PRN

## 2014-05-05 MED ORDER — ENOXAPARIN SODIUM 40 MG/0.4ML ~~LOC~~ SOLN
40.0000 mg | SUBCUTANEOUS | Status: DC
  Administered 2014-05-05: 40 mg via SUBCUTANEOUS
  Filled 2014-05-05 (×2): qty 0.4

## 2014-05-05 MED ORDER — GEMFIBROZIL 600 MG PO TABS
600.0000 mg | ORAL_TABLET | Freq: Two times a day (BID) | ORAL | Status: DC
  Administered 2014-05-05 – 2014-05-06 (×4): 600 mg via ORAL
  Filled 2014-05-05 (×5): qty 1

## 2014-05-05 MED ORDER — INSULIN ASPART 100 UNIT/ML ~~LOC~~ SOLN: 0.0000 [IU] | Freq: Three times a day (TID) | SUBCUTANEOUS | Status: DC

## 2014-05-05 MED ORDER — SODIUM CHLORIDE 0.9 % IV BOLUS (SEPSIS): 500.0000 mL | Freq: Once | INTRAVENOUS | Status: DC

## 2014-05-05 MED ORDER — ONDANSETRON HCL 4 MG PO TABS: 4.0000 mg | ORAL_TABLET | Freq: Four times a day (QID) | ORAL | Status: DC | PRN

## 2014-05-05 NOTE — ED Notes (Signed)
Patient transported to CT 

## 2014-05-05 NOTE — H&P (Signed)
PCP:   Viviana Simpler, MD  Cardiology [facility], [facility]   Chief Complaint:  syncope  HPI: [name] is a 58 y.o. male   has a past medical history of Diabetes mellitus; GERD (gastroesophageal reflux disease); PVD (peripheral vascular disease); History of colonic polyps; Diverticulosis of colon; Sleep apnea; Non-ischemic cardiomyopathy; Coronary artery disease; COPD (chronic obstructive pulmonary disease); Tobacco abuse; and Urinary incontinence.   Presented with  Patient was having some leg cramping and got up to use the bathroom. He felt lightheaded and synopsized. Afterwords he had mild chest pain as well.  Defibrillator have been interrogated in in ER and no event were recorded. He denies feeling lightheaded now.  Patient was noted to be become hypotensive down to 80/49 when he stood up. Patient feels better now. Denies diarrhea, no vomiting but felt slightly nauseous. Wife reports he has been out all day in the sun doing gardening work.  He was not drinking water but rather soda.  Patient has hx of OSA but does not use his CPAP on the regular bases.   Hospitalist was called for admission for  Orthostatic hypotension and syncope  Review of Systems:    Pertinent positives include: nausea, dizziness,  Constitutional:  No weight loss, night sweats, Fevers, chills, fatigue, weight loss  HEENT:  No headaches, Difficulty swallowing,Tooth/dental problems,Sore throat,  No sneezing, itching, ear ache, nasal congestion, post nasal drip,  Cardio-vascular:  No chest pain, Orthopnea, PND, anasarca, palpitations.no Bilateral lower extremity swelling  GI:  No heartburn, indigestion, abdominal pain,  vomiting, diarrhea, change in bowel habits, loss of appetite, melena, blood in stool, hematemesis Resp:  no shortness of breath at rest. No dyspnea on exertion, No excess mucus, no productive cough, No non-productive cough, No coughing up of blood.No change in color of mucus.No  wheezing. Skin:  no rash or lesions. No jaundice GU:  no dysuria, change in color of urine, no urgency or frequency. No straining to urinate.  No flank pain.  Musculoskeletal:  No joint pain or no joint swelling. No decreased range of motion. No back pain.  Psych:  No change in mood or affect. No depression or anxiety. No memory loss.  Neuro: no localizing neurological complaints, no tingling, no weakness, no double vision, no gait abnormality, no slurred speech, no confusion  Otherwise ROS are negative except for above, 10 systems were reviewed  Past Medical History: Past Medical History  Diagnosis Date  . Diabetes mellitus     type 2  . GERD (gastroesophageal reflux disease)     with esophagitis  . PVD (peripheral vascular disease)     bilateral common iliac artery aneurysms. Right SFA occlusion over a long segment. Left SFA disease with occlusion of the left TP trunk. Artirogram Oct. 2006  . History of colonic polyps   . Diverticulosis of colon   . Sleep apnea   . Non-ischemic cardiomyopathy   . Coronary artery disease      LAD a 60-70% stenosis, moderate size second diagonal with 50% stenosis, circumflex AV groove 75-80% stenosis with a branching obtuse marginal with a superior branch subtotal stenosis followed by diffuse disease, right coronary artery  had nonobstructive disease.  EF was 35%  . COPD (chronic obstructive pulmonary disease)   . Tobacco abuse   . Urinary incontinence     detrussor instability   Past Surgical History  Procedure Laterality Date  . S/p icd placement       Medtronic Maximo [phone] single chamber  .  Colonoscopy  04/2005  . Cardiac catheterization  06/2004    negative  . Pacemaker insertion  11/2005  . Copd exacerbation       Medications: Prior to Admission medications   Medication Sig Start Date End Date Taking? Authorizing Provider  aspirin 81 MG tablet Take 81 mg by mouth daily.    Yes Historical Provider, MD  atorvastatin (LIPITOR) 10  MG tablet Take 10 mg by mouth every evening.   Yes Historical Provider, MD  bisoprolol (ZEBETA) 10 MG tablet Take 1 tablet (10 mg total) by mouth daily.   Yes Minus Breeding, MD  enalapril (VASOTEC) 20 MG tablet Take 1 tablet (20 mg total) by mouth daily. 02/08/14 02/08/15 Yes Minus Breeding, MD  gemfibrozil (LOPID) 600 MG tablet Take 1 tablet (600 mg total) by mouth 2 (two) times daily before a meal. 06/28/13  Yes Venia Carbon, MD  isosorbide mononitrate (ISMO,MONOKET) 20 MG tablet Take 1 tablet (20 mg total) by mouth 2 (two) times daily. 12/01/13  Yes Minus Breeding, MD  multivitamin [facility]) per tablet Take 1 tablet by mouth daily.    Yes Historical Provider, MD  neomycin-polymyxin-hydrocortisone (CORTISPORIN) otic solution Place 4 drops into the right ear 4 (four) times daily. 04/25/14  Yes Venia Carbon, MD  vitamin B-12 (CYANOCOBALAMIN) 1000 MCG tablet Take 1,000 mcg by mouth daily.    Yes Historical Provider, MD  albuterol (PROVENTIL HFA;VENTOLIN HFA) 108 (90 BASE) MCG/ACT inhaler Inhale 2 puffs into the lungs 4 (four) times daily as needed. 12/29/12   Venia Carbon, MD  albuterol (PROVENTIL) (5 MG/ML) 0.5% nebulizer solution Take 0.5 mLs (2.5 mg total) by nebulization every 2 (two) hours as needed for wheezing or shortness of breath. 12/18/12   Shanker Kristeen Mans, MD    Allergies:  No Known Allergies  Social History:  Ambulatory  Independently  Lives at home   With family     reports that he has been smoking Cigarettes.  He has a 80 pack-year smoking history. He has never used smokeless tobacco. He reports that he does not drink alcohol or use illicit drugs.    Family History: family history includes Cancer in his brother and sister; Coronary artery disease in his maternal aunt; Heart failure in his maternal aunt; Lung cancer in his maternal aunt and maternal uncle; Stroke in his father.    Physical Exam: Patient Vitals for the past 24 hrs:  BP Temp Temp src Pulse Resp SpO2  Height Weight  05/05/14 0412 106/46 mmHg - - 61 16 93 % - -  05/05/14 0336 110/55 mmHg - - 59 16 96 % - -  05/05/14 0307 111/59 mmHg - - 60 - - - -  05/05/14 0304 126/73 mmHg - - 66 - - - -  05/05/14 0302 131/61 mmHg - - 56 - - - -  05/05/14 0136 87/49 mmHg - - 57 17 94 % - -  05/05/14 0043 102/61 mmHg 97.9 F (36.6 C) Oral 61 18 99 % 5\' 11"  (1.803 m) 114.306 kg (252 lb)    1. General:  in No Acute distress 2. Psychological: Alert and   Oriented 3. Head/ENT:    Dry Mucous Membranes                          Head Non traumatic, neck supple  Normal Dentition 4. SKIN:   decreased Skin turgor,  Skin clean Dry and intact no rash 5. Heart: Regular rate and rhythm no Murmur, Rub or gallop 6. Lungs: occasional wheezes bibasilar crackles  Coarse breath sounds 7. Abdomen: Soft, non-tender, Non distended 8. Lower extremities: no clubbing, cyanosis, or edema 9. Neurologically Grossly intact, moving all 4 extremities equally 10. MSK: Normal range of motion  body mass index is 35.16 kg/(m^2).   Labs on Admission:   Recent Labs  05/05/14 0052 05/05/14 0101  NA 135* 136*  K 4.3 4.0  CL 98 100  CO2 25  --   GLUCOSE 129* 131*  BUN 14 13  CREATININE 0.85 0.90  CALCIUM 8.9  --    No results found for this basename: AST, ALT, ALKPHOS, BILITOT, PROT, ALBUMIN,  in the last 72 hours No results found for this basename: LIPASE, AMYLASE,  in the last 72 hours  Recent Labs  05/05/14 0052 05/05/14 0101  WBC 8.2  --   HGB 14.7 15.0  HCT 41.1 44.0  MCV 81.9  --   PLT 265  --     Recent Labs  05/05/14 0052  TROPONINI <0.30   No results found for this basename: TSH, T4TOTAL, FREET3, T3FREE, THYROIDAB,  in the last 72 hours No results found for this basename: VITAMINB12, FOLATE, FERRITIN, TIBC, IRON, RETICCTPCT,  in the last 72 hours Lab Results  Component Value Date   HGBA1C 5.7 04/21/2014    Estimated Creatinine Clearance: 116.4 ml/min (by C-G formula based on  Cr of 0.9). ABG    Component Value Date/Time   TCO2 25 05/05/2014 0101     No results found for this basename: DDIMER     Other results:  I have pearsonaly reviewed this: ECG REPORT  Rate: 60  Rhythm: SR with 1st degree AV block ST&T Change: no ischemia   UA no evidence  of UTI  BNP (last 3 results) No results found for this basename: PROBNP,  in the last 8760 hours  Filed Weights   05/05/14 0043  Weight: 114.306 kg (252 lb)     Cultures: No results found for this basename: sdes, specrequest, cult, reptstatus         Radiological Exams on Admission: Dg Chest 2 View  05/05/2014   CLINICAL DATA:  Near syncope.  Dizziness.  EXAM: CHEST  2 VIEW  COMPARISON:  DG CHEST 2 VIEW dated 12/15/2012  FINDINGS: Unchanged appearance of cardiac pacemaker. Mild hyperinflation. The heart size and mediastinal contours are within normal limits. Both lungs are clear. Degenerative changes in the spine. No significant change since prior study.  IMPRESSION: No active cardiopulmonary disease.   Electronically Signed   By: Lucienne Capers M.D.   On: 05/05/2014 02:25   Ct Head Wo Contrast  05/05/2014   CLINICAL DATA:  Near syncope  EXAM: CT HEAD WITHOUT CONTRAST  TECHNIQUE: Contiguous axial images were obtained from the base of the skull through the vertex without intravenous contrast.  COMPARISON:  None.  FINDINGS: There is no acute intracranial hemorrhage or infarct. No mass lesion or midline shift. Gray-white matter differentiation is well maintained. Ventricles are normal in size without evidence of hydrocephalus. CSF containing spaces are within normal limits. No extra-axial fluid collection.  The calvarium is intact.  Orbital soft tissues are within normal limits.  The paranasal sinuses and mastoid air cells are well pneumatized and free of fluid.  Scalp soft tissues are unremarkable.  IMPRESSION: Normal head CT with no  acute intracranial abnormality identified.   Electronically Signed   By:  Jeannine Boga M.D.   On: 05/05/2014 01:21   Ct Cervical Spine Wo Contrast  05/05/2014   CLINICAL DATA:  Dizziness and syncope with fall, striking the head. Posterior neck pain.  EXAM: CT CERVICAL SPINE WITHOUT CONTRAST  TECHNIQUE: Multidetector CT imaging of the cervical spine was performed without intravenous contrast. Multiplanar CT image reconstructions were also generated.  COMPARISON:  CT HEAD W/O CM dated 05/05/2014  FINDINGS: Coverage of the examination extends just to the superior endplate of T1. The body of T1 is not visualized. Normal alignment of the cervical spine and facet joints. Diffuse degenerative changes with narrowed cervical interspaces and endplate hypertrophic changes throughout. Degenerative changes in the facet joints. The C1-2 articulation appears intact. No vertebral compression deformities. No prevertebral soft tissue swelling. No focal bone lesion or bone destruction. Bone cortex and trabecular architecture appear intact. No paraspinal soft tissue infiltration.  IMPRESSION: Degenerative changes in the cervical spine. Normal alignment. No displaced fractures identified.   Electronically Signed   By: Lucienne Capers M.D.   On: 05/05/2014 02:42    Chart has been reviewed  Assessment/Plan  58 year-old gentleman history of coronary disease, cardiomyopathy EF 35% in 2012 status post defibrillator here with syncopal episode in the setting of orthostatic hypotension  Present on Admission:  . Syncope - in in the setting of orthostasis. Will give gentle IV fluids repeat echo gram and carotid Dopplers. Hold his bisoprolol and ACE inhibitor overnight to monitor blood pressure obtain echo gram and carotid Dopplers  . Type II or unspecified type diabetes mellitus with peripheral circulatory disorders, not stated as uncontrolled(250.70) sliding scale monitor blood sugars  . COPD - spoke with patient's about importance of quitting tobacco. Albuterol and Atrovent nebs  . CAD  (coronary artery disease) cycle cardiac enzymes serial EKG  . AUTOMATIC IMPLANTABLE CARDIAC DEFIBRILLATOR SITU this was reportedly interrogated to emerge department no bands were noted  . Chronic systolic heart failure repeat echo gram currently appears to fluid down  . Hypotension in the setting of orthostasis and poor fluid intake. Will give gentle fluids and follow blood pressures  . Chest pain cycle cardiac enzymes repeat serial EKG     Prophylaxis:  Lovenox CODE STATUS:  FULL CODE   Other plan as per orders.  I have spent a total of 55 min on this admission  Keyasia Jolliff 05/05/2014, 5:02 AM

## 2014-05-05 NOTE — ED Provider Notes (Signed)
CSN: [id number]     Arrival date & time 05/05/14  0037 History   First MD Initiated Contact with Patient 05/05/14 0144     Chief Complaint  Patient presents with  . Near Syncope     (Consider location/radiation/quality/duration/timing/severity/associated sxs/prior Treatment) HPI Patient states he was having cramps in his legs. He got up to stretch them and he got lightheaded. He then had a syncopal episode where he fell from a standing height. He believes he struck his head. He is complaining of low back and neck pain. He has no dizziness, focal weakness or numbness. He denied chest pain at any point. Patient does have a defibrillator in place.   Past Medical History  Diagnosis Date  . Diabetes mellitus     type 2  . GERD (gastroesophageal reflux disease)     with esophagitis  . PVD (peripheral vascular disease)     bilateral common iliac artery aneurysms. Right SFA occlusion over a long segment. Left SFA disease with occlusion of the left TP trunk. Artirogram Oct. 2006  . History of colonic polyps   . Diverticulosis of colon   . Sleep apnea   . Non-ischemic cardiomyopathy   . Coronary artery disease      LAD a 60-70% stenosis, moderate size second diagonal with 50% stenosis, circumflex AV groove 75-80% stenosis with a branching obtuse marginal with a superior branch subtotal stenosis followed by diffuse disease, right coronary artery  had nonobstructive disease.  EF was 35%  . COPD (chronic obstructive pulmonary disease)   . Tobacco abuse   . Urinary incontinence     detrussor instability   Past Surgical History  Procedure Laterality Date  . S/p icd placement       Medtronic Maximo [phone] single chamber  . Colonoscopy  04/2005  . Cardiac catheterization  06/2004    negative  . Pacemaker insertion  11/2005  . Copd exacerbation     Family History  Problem Relation Age of Onset  . Stroke Father   . Coronary artery disease Maternal Aunt   . Heart failure Maternal Aunt   .  Lung cancer Maternal Aunt   . Lung cancer Maternal Uncle   . Cancer Brother     Mouth  . Cancer Sister     throat   History  Substance Use Topics  . Smoking status: Current Every Day Smoker -- 2.00 packs/day for 40 years    Types: Cigarettes  . Smokeless tobacco: Never Used     Comment: GAVE 1-800-QUIT-NOW  . Alcohol Use: No    Review of Systems  Constitutional: Negative for fever and chills.  HENT: Negative for facial swelling.   Respiratory: Negative for shortness of breath.   Cardiovascular: Negative for chest pain.  Gastrointestinal: Negative for nausea, vomiting and abdominal pain.  Musculoskeletal: Positive for back pain and neck pain. Negative for myalgias and neck stiffness.  Skin: Negative for rash and wound.  Neurological: Positive for dizziness, syncope and light-headedness. Negative for weakness, numbness and headaches.  All other systems reviewed and are negative.     Allergies  Review of patient's allergies indicates no known allergies.  Home Medications   Prior to Admission medications   Medication Sig Start Date End Date Taking? Authorizing Provider  aspirin 81 MG tablet Take 81 mg by mouth daily.    Yes Historical Provider, MD  atorvastatin (LIPITOR) 10 MG tablet Take 10 mg by mouth every evening.   Yes Historical Provider, MD  bisoprolol (ZEBETA) 10  MG tablet Take 1 tablet (10 mg total) by mouth daily.   Yes Minus Breeding, MD  enalapril (VASOTEC) 20 MG tablet Take 1 tablet (20 mg total) by mouth daily. 02/08/14 02/08/15 Yes Minus Breeding, MD  gemfibrozil (LOPID) 600 MG tablet Take 1 tablet (600 mg total) by mouth 2 (two) times daily before a meal. 06/28/13  Yes Venia Carbon, MD  isosorbide mononitrate (ISMO,MONOKET) 20 MG tablet Take 1 tablet (20 mg total) by mouth 2 (two) times daily. 12/01/13  Yes Minus Breeding, MD  multivitamin [facility]) per tablet Take 1 tablet by mouth daily.    Yes Historical Provider, MD  neomycin-polymyxin-hydrocortisone  (CORTISPORIN) otic solution Place 4 drops into the right ear 4 (four) times daily. 04/25/14  Yes Venia Carbon, MD  vitamin B-12 (CYANOCOBALAMIN) 1000 MCG tablet Take 1,000 mcg by mouth daily.    Yes Historical Provider, MD  albuterol (PROVENTIL HFA;VENTOLIN HFA) 108 (90 BASE) MCG/ACT inhaler Inhale 2 puffs into the lungs 4 (four) times daily as needed. 12/29/12   Venia Carbon, MD  albuterol (PROVENTIL) (5 MG/ML) 0.5% nebulizer solution Take 0.5 mLs (2.5 mg total) by nebulization every 2 (two) hours as needed for wheezing or shortness of breath. 12/18/12   Shanker Kristeen Mans, MD   BP 111/59  Pulse 60  Temp(Src) 97.9 F (36.6 C) (Oral)  Resp 17  Ht 5\' 11"  (1.803 m)  Wt 252 lb (114.306 kg)  BMI 35.16 kg/m2  SpO2 94% Physical Exam  Nursing note and vitals reviewed. Constitutional: He is oriented to person, place, and time. He appears well-developed and well-nourished. No distress.  HENT:  Head: Normocephalic and atraumatic.  Mouth/Throat: Oropharynx is clear and moist.  Eyes: EOM are normal. Pupils are equal, round, and reactive to light.  Neck: Normal range of motion. Neck supple.  Diffuse posterior cervical tenderness.  Cardiovascular: Normal rate and regular rhythm.   Pulmonary/Chest: Effort normal. No respiratory distress. He has wheezes (scattered wheezes). He has no rales. He exhibits no tenderness.  Abdominal: Soft. Bowel sounds are normal. He exhibits no distension and no mass. There is no tenderness. There is no rebound and no guarding.  Musculoskeletal: Normal range of motion. He exhibits tenderness (rright paraspinal lumbar tenderness. Patient has no midline thoracic or lumbar tenderness.). He exhibits no edema.  No calf swelling or tenderness.  Neurological: He is alert and oriented to person, place, and time.  Patient is alert and oriented x3 with clear, goal oriented speech. Patient has 5/5 motor in all extremities. Sensation is intact to light touch. Bilateral  finger-to-nose is normal with no signs of dysmetria. Patient has a normal gait and walks without assistance.   Skin: Skin is warm and dry. No rash noted. No erythema.  Psychiatric: He has a normal mood and affect. His behavior is normal.    ED Course  Procedures (including critical care time) Labs Review Labs Reviewed  BASIC METABOLIC PANEL - Abnormal; Notable for the following:    Sodium 135 (*)    Glucose, Bld 129 (*)    All other components within normal limits  I-STAT CHEM 8, ED - Abnormal; Notable for the following:    Sodium 136 (*)    Glucose, Bld 131 (*)    All other components within normal limits  CBC  TROPONIN I  URINALYSIS, ROUTINE W REFLEX MICROSCOPIC    Imaging Review Dg Chest 2 View  05/05/2014   CLINICAL DATA:  Near syncope.  Dizziness.  EXAM: CHEST  2 VIEW  COMPARISON:  DG CHEST 2 VIEW dated 12/15/2012  FINDINGS: Unchanged appearance of cardiac pacemaker. Mild hyperinflation. The heart size and mediastinal contours are within normal limits. Both lungs are clear. Degenerative changes in the spine. No significant change since prior study.  IMPRESSION: No active cardiopulmonary disease.   Electronically Signed   By: Lucienne Capers M.D.   On: 05/05/2014 02:25   Ct Head Wo Contrast  05/05/2014   CLINICAL DATA:  Near syncope  EXAM: CT HEAD WITHOUT CONTRAST  TECHNIQUE: Contiguous axial images were obtained from the base of the skull through the vertex without intravenous contrast.  COMPARISON:  None.  FINDINGS: There is no acute intracranial hemorrhage or infarct. No mass lesion or midline shift. Gray-white matter differentiation is well maintained. Ventricles are normal in size without evidence of hydrocephalus. CSF containing spaces are within normal limits. No extra-axial fluid collection.  The calvarium is intact.  Orbital soft tissues are within normal limits.  The paranasal sinuses and mastoid air cells are well pneumatized and free of fluid.  Scalp soft tissues are  unremarkable.  IMPRESSION: Normal head CT with no acute intracranial abnormality identified.   Electronically Signed   By: Jeannine Boga M.D.   On: 05/05/2014 01:21   Ct Cervical Spine Wo Contrast  05/05/2014   CLINICAL DATA:  Dizziness and syncope with fall, striking the head. Posterior neck pain.  EXAM: CT CERVICAL SPINE WITHOUT CONTRAST  TECHNIQUE: Multidetector CT imaging of the cervical spine was performed without intravenous contrast. Multiplanar CT image reconstructions were also generated.  COMPARISON:  CT HEAD W/O CM dated 05/05/2014  FINDINGS: Coverage of the examination extends just to the superior endplate of T1. The body of T1 is not visualized. Normal alignment of the cervical spine and facet joints. Diffuse degenerative changes with narrowed cervical interspaces and endplate hypertrophic changes throughout. Degenerative changes in the facet joints. The C1-2 articulation appears intact. No vertebral compression deformities. No prevertebral soft tissue swelling. No focal bone lesion or bone destruction. Bone cortex and trabecular architecture appear intact. No paraspinal soft tissue infiltration.  IMPRESSION: Degenerative changes in the cervical spine. Normal alignment. No displaced fractures identified.   Electronically Signed   By: Lucienne Capers M.D.   On: 05/05/2014 02:42     EKG Interpretation None      Date: 05/05/2014  Rate: 60  Rhythm: normal sinus rhythm  QRS Axis: normal  Intervals: PR prolonged  ST/T Wave abnormalities: nonspecific T wave changes  Conduction Disutrbances:first-degree A-V block   Narrative Interpretation:   Old EKG Reviewed: none available   MDM   Final diagnoses:  None   Patient with symptomatic bradycardia. Patient's defibrillator interrogated in the emergency department. No arrhythmic events. No firing or pacing.  Discussed with hospitalist and will admit to telemetry bed.    Julianne Rice, MD 05/05/14 [phone]

## 2014-05-05 NOTE — ED Notes (Signed)
Pt reports he experienced leg cramping approx 2315 yesterday evening, upon standing pt experienced dizziness and a syncopal episode causing him to fall and hit his head on the dry-wall. Pt admits to posterior neck pain and rt lower back pain w/ palpation. Pt is A&Ox4 and in no acute distress at this time.

## 2014-05-05 NOTE — ED Notes (Signed)
Attempted to call report to 2 [facility] unit nurse.

## 2014-05-05 NOTE — ED Notes (Signed)
Charge nurse interrogated pt.'s Medtronic pacemaker at bedside .

## 2014-05-05 NOTE — ED Notes (Signed)
Pt states he got a cramp in his leg, then got dizzy and passed out.

## 2014-05-05 NOTE — Progress Notes (Signed)
[facility] PROGRESS NOTE  CEJAY CAMBRE HEK:[id number] DOB: [birth year] DOA: 05/05/2014 PCP: Viviana Simpler, MD I have seen and examined pt who is a 58yo admitted this am by Dr Roel Cluck with history of Diabetes mellitus; GERD (gastroesophageal reflux disease); PVD (peripheral vascular disease); History of colonic polyps; Diverticulosis of colon; Sleep apnea; Non-ischemic cardiomyopathy; Coronary artery disease; COPD (chronic obstructive pulmonary disease); Tobacco abuse; and Urinary incontinence following presentation with dizziness and a syncopal episode>> was hypotensive with systolic in the 09P standing. Will continue current management as per Dr. Doutova>>cautious hydration follow recheck orthostatics, also followup on pending echo and further treat accordingly.      [facility] Pager [phone]. If 7PM-7AM, please contact night-coverage at [email or web address], password [facility] 05/05/2014, 3:38 PM  LOS: 0 days

## 2014-05-05 NOTE — Progress Notes (Signed)
UR completed 

## 2014-05-06 DIAGNOSIS — Z95810 Presence of automatic (implantable) cardiac defibrillator: Secondary | ICD-10-CM

## 2014-05-06 DIAGNOSIS — I951 Orthostatic hypotension: Secondary | ICD-10-CM | POA: Diagnosis not present

## 2014-05-06 DIAGNOSIS — I517 Cardiomegaly: Secondary | ICD-10-CM

## 2014-05-06 DIAGNOSIS — R55 Syncope and collapse: Secondary | ICD-10-CM

## 2014-05-06 DIAGNOSIS — E538 Deficiency of other specified B group vitamins: Secondary | ICD-10-CM

## 2014-05-06 DIAGNOSIS — F17200 Nicotine dependence, unspecified, uncomplicated: Secondary | ICD-10-CM

## 2014-05-06 DIAGNOSIS — I2510 Atherosclerotic heart disease of native coronary artery without angina pectoris: Secondary | ICD-10-CM | POA: Diagnosis not present

## 2014-05-06 LAB — COMPREHENSIVE METABOLIC PANEL
ALK PHOS: 78 U/L (ref 39–117)
ALT: 12 U/L (ref 0–53)
AST: 16 U/L (ref 0–37)
Albumin: 3.6 g/dL (ref 3.5–5.2)
BUN: 10 mg/dL (ref 6–23)
CALCIUM: 9.2 mg/dL (ref 8.4–10.5)
CO2: 21 meq/L (ref 19–32)
Chloride: 104 mEq/L (ref 96–112)
Creatinine, Ser: 0.69 mg/dL (ref 0.50–1.35)
GLUCOSE: 101 mg/dL — AB (ref 70–99)
Potassium: 4.4 mEq/L (ref 3.7–5.3)
SODIUM: 139 meq/L (ref 137–147)
Total Bilirubin: 0.4 mg/dL (ref 0.3–1.2)
Total Protein: 6.6 g/dL (ref 6.0–8.3)

## 2014-05-06 LAB — CBC
HCT: 39.9 % (ref 39.0–52.0)
HEMOGLOBIN: 14.1 g/dL (ref 13.0–17.0)
MCH: 29.2 pg (ref 26.0–34.0)
MCHC: 35.3 g/dL (ref 30.0–36.0)
MCV: 82.6 fL (ref 78.0–100.0)
PLATELETS: 221 10*3/uL (ref 150–400)
RBC: 4.83 MIL/uL (ref 4.22–5.81)
RDW: 13.5 % (ref 11.5–15.5)
WBC: 7.2 10*3/uL (ref 4.0–10.5)

## 2014-05-06 LAB — PHOSPHORUS: Phosphorus: 4 mg/dL (ref 2.3–4.6)

## 2014-05-06 LAB — GLUCOSE, CAPILLARY
GLUCOSE-CAPILLARY: 95 mg/dL (ref 70–99)
Glucose-Capillary: 98 mg/dL (ref 70–99)
Glucose-Capillary: 75 mg/dL (ref 70–99)

## 2014-05-06 LAB — MAGNESIUM: Magnesium: 2.1 mg/dL (ref 1.5–2.5)

## 2014-05-06 LAB — TSH: TSH: 1.33 u[IU]/mL (ref 0.350–4.500)

## 2014-05-06 LAB — HEMOGLOBIN A1C
HEMOGLOBIN A1C: 5.5 % (ref <5.7)
MEAN PLASMA GLUCOSE: 111 mg/dL (ref <117)

## 2014-05-06 MED ORDER — IPRATROPIUM BROMIDE 0.02 % IN SOLN: 0.5000 mg | RESPIRATORY_TRACT | Status: DC | PRN

## 2014-05-06 MED ORDER — SODIUM CHLORIDE 0.9 % IV BOLUS (SEPSIS)
250.0000 mL | Freq: Once | INTRAVENOUS | Status: AC
  Administered 2014-05-06: 250 mL via INTRAVENOUS

## 2014-05-06 MED ORDER — ENOXAPARIN SODIUM 60 MG/0.6ML ~~LOC~~ SOLN
55.0000 mg | SUBCUTANEOUS | Status: DC
  Filled 2014-05-06: qty 0.6

## 2014-05-06 MED ORDER — BISOPROLOL FUMARATE 10 MG PO TABS: 5.0000 mg | ORAL_TABLET | Freq: Every day | ORAL | Status: DC

## 2014-05-06 NOTE — Progress Notes (Signed)
Pt discharged home with wife. Pt and wife verbalized understanding of discharge instructions.

## 2014-05-06 NOTE — Discharge Summary (Signed)
Physician Discharge Summary  [name] [id number] DOB: Sep 06, 1956 DOA: 05/05/2014  PCP: Viviana Simpler, MD  Admit date: 05/05/2014 Discharge date: 05/06/2014  Time spent: <87minutes  Recommendations for Outpatient Follow-up:  Follow-up Information   Follow up with Viviana Simpler, MD. (in 1-2wks, call for appt upon discharge)    Specialties:  Internal Medicine, Pediatrics   Contact information:   [place] [address] [phone]       Follow up with Minus Breeding, MD. (next week, call for appt upon discharge)    Specialty:  Cardiology   Contact information:   [address] [place] [address] [phone]        Discharge Diagnoses:  Active Problems:   Type II or unspecified type diabetes mellitus with peripheral circulatory disorders, not stated as uncontrolled(250.70)   CARDIOMYOPATHY   COPD   AUTOMATIC IMPLANTABLE CARDIAC DEFIBRILLATOR SITU   Chronic systolic heart failure   OSA (obstructive sleep apnea)   CAD (coronary artery disease)   Syncope and collapse   Hypotension   Chest pain   Syncope   Discharge Condition:improved/stable  Diet recommendation: Low sodium modified carb diet  Filed Weights   05/05/14 0043 05/05/14 0530 05/06/14 0557  Weight: 114.306 kg (252 lb) 110.1 kg (242 lb 11.6 oz) 108.5 kg (239 lb 3.2 oz)    History of present illness:  The patient is a 58 year old with history of Diabetes mellitus; GERD (gastroesophageal reflux disease); PVD (peripheral vascular disease); History of colonic polyps; Diverticulosis of colon; Sleep apnea; Non-ischemic cardiomyopathy; Coronary artery disease; COPD (chronic obstructive pulmonary disease); Tobacco abuse; and Urinary incontinence who presented with a syncopal episode following lightheadedness that he had had with standing. Afterwards he had mild chest pain as well. Defibrillator have been interrogated in in ER and no event were recorded. He denies feeling  lightheaded in the ER.  Patient was noted to be become hypotensive down to 80/49 when he stood up. Patient feels better now. Denies diarrhea, no vomiting but felt slightly nauseous. Wife reports he has been out all day in the sun doing gardening work.  He was not drinking water but rather soda.  Patient has hx of OSA but does not use his CPAP on the regular bases.  Hospitalist was called for admission for Orthostatic hypotension and syncope   Hospital Course:  . Orthostatic syncope-upon admission it was noted that patient's last EF per echo of 2012 was 35%, his antihypertensives were held and he was gently hydrated. Carotid Doppler was done and it showed left ICA stenosis 60-79%>>same as his last carotid Doppler 6 months ago and this is followed by Dr. Percival Spanish. Cardiac enzymes were cycled and came back negative  -On followup this a.m. patient was still slightly orthostatic, he was again cautiously hydrated>> per followup vitals this p.m. he is no longer orthostatic, he ambulated with PT without difficulty and his asymptomatic. 2-D echocardiogram shows EF of 35-40%>> noted that the 2012 echo showed EF of 35%(2009 45-50%) his to followup with Dr. Percival Spanish.  . Type II or unspecified type diabetes mellitus with peripheral circulatory disorders, not stated as uncontrolled(250.70)  -Sugars were monitored and he was covered with sliding scale insulin. He is to continue modified carb diet. Followup with his PCP to continue monitoring and further management as appropriate . COPD - spoke with patient's about importance of quitting tobacco. Continue Albuterol and Atrovent nebs  . CAD (coronary artery disease) cycle cardiac enzymes serial EKG  . AUTOMATIC IMPLANTABLE  CARDIAC DEFIBRILLATOR SITU this was reportedly interrogated to emerge department no bands were noted  . Chronic systolic heart failure repeat echo gram currently appears to fluid down  -Compensated, follow up with Dr.  Percival Spanish    Procedures:  2-D echocardiogram  Study Conclusions  - Left ventricle: The cavity size was moderately dilated. Systolic function was moderately reduced. The estimated ejection fraction was in the range of 35% to 40%. Wall motion was normal; there were no regional wall motion abnormalities. Doppler parameters are consistent with abnormal left ventricular relaxation (grade 1 diastolic dysfunction). There was no evidence of elevated ventricular filling pressure by Doppler parameters. - Aortic valve: Trileaflet; mildly thickened, mildly calcified leaflets. No regurgitation. - Mitral valve: No regurgitation. - Left atrium: The atrium was mildly dilated. - Right ventricle: Systolic function was normal. - Right atrium: Pacer wire or catheter noted in right atrium. - Tricuspid valve: Trivial regurgitation. - Pulmonic valve: No regurgitation. - Pulmonary arteries: Systolic pressure was within the normal range. Impressions:  - Compared to the prior study in 2009 the LVEF is now moderately decreased with LVEF 35-40%, previously read as 45-50%. Transthoracic echocardiography.   Carotid Doppler  Vascular Ultrasound  Carotid Duplex (Doppler) has been completed.  Findings suggest 1-39% right internal carotid artery stenosis and 60-79% left internal carotid artery stenosis. Vertebral arteries are patent with antegrade flow.    Consultations:  None  Discharge Exam: Filed Vitals:   05/06/14 1533  BP: 112/63  Pulse: 71  Temp:   Resp: 18    General: Alert and oriented x3, in no apparent distress Cardiovascular: Regular rate and rhythm normal S1-S2 Respiratory: Scattered rhonchi, no crackles or wheezes Extremities: No cyanosis and no edema  Discharge Instructions You were cared for by a hospitalist during your hospital stay. If you have any questions about your discharge medications or the care you received while you were in the hospital after you are discharged,  you can call the unit and asked to speak with the hospitalist on call if the hospitalist that took care of you is not available. Once you are discharged, your primary care physician will handle any further medical issues. Please note that NO REFILLS for any discharge medications will be authorized once you are discharged, as it is imperative that you return to your primary care physician (or establish a relationship with a primary care physician if you do not have one) for your aftercare needs so that they can reassess your need for medications and monitor your lab values.  Discharge Instructions   Diet - low sodium modified carbohydrate diet    Complete by:  As directed      Increase activity slowly    Complete by:  As directed             Medication List         albuterol (5 MG/ML) 0.5% nebulizer solution  Commonly known as:  PROVENTIL  Take 0.5 mLs (2.5 mg total) by nebulization every 2 (two) hours as needed for wheezing or shortness of breath.     albuterol 108 (90 BASE) MCG/ACT inhaler  Commonly known as:  PROVENTIL HFA;VENTOLIN HFA  Inhale 2 puffs into the lungs 4 (four) times daily as needed.     aspirin 81 MG tablet  Take 81 mg by mouth daily.     atorvastatin 10 MG tablet  Commonly known as:  LIPITOR  Take 10 mg by mouth every evening.     bisoprolol 10 MG tablet  Commonly  known as:  ZEBETA  Take 0.5 tablets (5 mg total) by mouth daily.     enalapril 20 MG tablet  Commonly known as:  VASOTEC  Take 1 tablet (20 mg total) by mouth daily.     gemfibrozil 600 MG tablet  Commonly known as:  LOPID  Take 1 tablet (600 mg total) by mouth 2 (two) times daily before a meal.     isosorbide mononitrate 20 MG tablet  Commonly known as:  ISMO,MONOKET  Take 1 tablet (20 mg total) by mouth 2 (two) times daily.     multivitamin per tablet  Take 1 tablet by mouth daily.     neomycin-polymyxin-hydrocortisone otic solution  Commonly known as:  CORTISPORIN  Place 4 drops into  the right ear 4 (four) times daily.     vitamin B-12 1000 MCG tablet  Commonly known as:  CYANOCOBALAMIN  Take 1,000 mcg by mouth daily.       No Known Allergies    The results of significant diagnostics from this hospitalization (including imaging, microbiology, ancillary and laboratory) are listed below for reference.    Significant Diagnostic Studies: Dg Chest 2 View  05/05/2014   CLINICAL DATA:  Near syncope.  Dizziness.  EXAM: CHEST  2 VIEW  COMPARISON:  DG CHEST 2 VIEW dated 12/15/2012  FINDINGS: Unchanged appearance of cardiac pacemaker. Mild hyperinflation. The heart size and mediastinal contours are within normal limits. Both lungs are clear. Degenerative changes in the spine. No significant change since prior study.  IMPRESSION: No active cardiopulmonary disease.   Electronically Signed   By: Lucienne Capers M.D.   On: 05/05/2014 02:25   Ct Head Wo Contrast  05/05/2014   CLINICAL DATA:  Near syncope  EXAM: CT HEAD WITHOUT CONTRAST  TECHNIQUE: Contiguous axial images were obtained from the base of the skull through the vertex without intravenous contrast.  COMPARISON:  None.  FINDINGS: There is no acute intracranial hemorrhage or infarct. No mass lesion or midline shift. Gray-white matter differentiation is well maintained. Ventricles are normal in size without evidence of hydrocephalus. CSF containing spaces are within normal limits. No extra-axial fluid collection.  The calvarium is intact.  Orbital soft tissues are within normal limits.  The paranasal sinuses and mastoid air cells are well pneumatized and free of fluid.  Scalp soft tissues are unremarkable.  IMPRESSION: Normal head CT with no acute intracranial abnormality identified.   Electronically Signed   By: Jeannine Boga M.D.   On: 05/05/2014 01:21   Ct Cervical Spine Wo Contrast  05/05/2014   CLINICAL DATA:  Dizziness and syncope with fall, striking the head. Posterior neck pain.  EXAM: CT CERVICAL SPINE WITHOUT  CONTRAST  TECHNIQUE: Multidetector CT imaging of the cervical spine was performed without intravenous contrast. Multiplanar CT image reconstructions were also generated.  COMPARISON:  CT HEAD W/O CM dated 05/05/2014  FINDINGS: Coverage of the examination extends just to the superior endplate of T1. The body of T1 is not visualized. Normal alignment of the cervical spine and facet joints. Diffuse degenerative changes with narrowed cervical interspaces and endplate hypertrophic changes throughout. Degenerative changes in the facet joints. The C1-2 articulation appears intact. No vertebral compression deformities. No prevertebral soft tissue swelling. No focal bone lesion or bone destruction. Bone cortex and trabecular architecture appear intact. No paraspinal soft tissue infiltration.  IMPRESSION: Degenerative changes in the cervical spine. Normal alignment. No displaced fractures identified.   Electronically Signed   By: Lucienne Capers M.D.   On:  05/05/2014 02:42    Microbiology: No results found for this or any previous visit (from the past 240 hour(s)).   Labs: Basic Metabolic Panel:  Recent Labs Lab 05/05/14 0052 05/05/14 0101 05/05/14 0815 05/06/14 0334  NA 135* 136*  --  139  K 4.3 4.0  --  4.4  CL 98 100  --  104  CO2 25  --   --  21  GLUCOSE 129* 131*  --  101*  BUN 14 13  --  10  CREATININE 0.85 0.90 0.67 0.69  CALCIUM 8.9  --   --  9.2  MG  --   --   --  2.1  PHOS  --   --   --  4.0   Liver Function Tests:  Recent Labs Lab 05/06/14 0334  AST 16  ALT 12  ALKPHOS 78  BILITOT 0.4  PROT 6.6  ALBUMIN 3.6   No results found for this basename: LIPASE, AMYLASE,  in the last 168 hours No results found for this basename: AMMONIA,  in the last 168 hours CBC:  Recent Labs Lab 05/05/14 0052 05/05/14 0101 05/05/14 0815 05/06/14 0334  WBC 8.2  --  6.7 7.2  HGB 14.7 15.0 14.4 14.1  HCT 41.1 44.0 40.2 39.9  MCV 81.9  --  82.0 82.6  PLT 265  --  227 221   Cardiac  Enzymes:  Recent Labs Lab 05/05/14 0052 05/05/14 0815 05/05/14 1235 05/05/14 1840  TROPONINI <0.30 <0.30 <0.30 <0.30   BNP: BNP (last 3 results) No results found for this basename: PROBNP,  in the last 8760 hours CBG:  Recent Labs Lab 05/05/14 1120 05/05/14 1639 05/05/14 2204 05/06/14 0553 05/06/14 1102  GLUCAP 80 75 92 98 75       Signed:  Latera Mclin C Makynlee Kressin  [facility] 05/06/2014, 5:22 PM

## 2014-05-06 NOTE — Progress Notes (Signed)
NS bolus, Orthostatic VS and echocardiogram complete. Dr.Viyuoh notified

## 2014-05-06 NOTE — Progress Notes (Signed)
*  PRELIMINARY RESULTS* Vascular Ultrasound Carotid Duplex (Doppler) has been completed.  Findings suggest 1-39% right internal carotid artery stenosis and 60-79% left internal carotid artery stenosis. Vertebral arteries are patent with antegrade flow.   Report of prior study from 11/25/2013 is available in [facility].   05/06/2014 1:56 PM Maudry Mayhew, RVT, RDCS, RDMS

## 2014-05-06 NOTE — Evaluation (Signed)
Physical Therapy Evaluation and D/C Patient Details Name: [name] MRN: [id number] DOB: 23-Jun-1956 Today's Date: 05/06/2014   History of Present Illness  Pt  with  history of Diabetes mellitus; GERD (gastroesophageal reflux disease); PVD (peripheral vascular disease); History of colonic polyps; Diverticulosis of colon; Sleep apnea; Non-ischemic cardiomyopathy; Coronary artery disease; COPD (chronic obstructive pulmonary disease); Tobacco abuse; and Urinary incontinence following presentation with dizziness and a syncopal episode>> was hypotensive with systolic in the 90Z standing.   Clinical Impression  Pt admitted with above. Pt currently without  functional limitations and ambulates independently in hospital environment.  Pt will not need skilled PT at this time.  Will not follow.  Thanks.    Follow Up Recommendations No PT follow up    Equipment Recommendations  None recommended by PT    Recommendations for Other Services       Precautions / Restrictions Precautions Precautions: None Restrictions Weight Bearing Restrictions: No      Mobility  Bed Mobility Overal bed mobility: Independent                Transfers Overall transfer level: Independent                  Ambulation/Gait Ambulation/Gait assistance: Independent Ambulation Distance (Feet): 450 Feet Assistive device: None Gait Pattern/deviations: WFL(Within Functional Limits)   Gait velocity interpretation: at or above normal speed for age/gender General Gait Details: No difficulties with balance.  Can accept challenges.    Stairs            Wheelchair Mobility    Modified Rankin (Stroke Patients Only)       Balance Overall balance assessment: Independent                               Standardized Balance Assessment Standardized Balance Assessment : Dynamic Gait Index   Dynamic Gait Index Level Surface: Normal Change in Gait Speed: Normal Gait with  Horizontal Head Turns: Normal Gait with Vertical Head Turns: Normal Gait and Pivot Turn: Normal Step Over Obstacle: Normal Step Around Obstacles: Normal Steps: Mild Impairment Total Score: 23       Pertinent Vitals/Pain VSS with orthostasis resolved at this point, No pain    Home Living Family/patient expects to be discharged to:: Private residence Living Arrangements: Spouse/significant other Available Help at Discharge: Family;Available PRN/intermittently Type of Home: House Home Access: Ramped entrance     Home Layout: One level Home Equipment: [place] - 4 wheels;Wheelchair - manual      Prior Function Level of Independence: Independent         Comments: Gardening     Hand Dominance        Extremity/Trunk Assessment   Upper Extremity Assessment: Defer to OT evaluation           Lower Extremity Assessment: Overall WFL for tasks assessed      Cervical / Trunk Assessment: Normal  Communication   Communication: No difficulties  Cognition Arousal/Alertness: Awake/alert Behavior During Therapy: WFL for tasks assessed/performed Overall Cognitive Status: Within Functional Limits for tasks assessed                      General Comments      Exercises        Assessment/Plan    PT Assessment Patent does not need any further PT services  PT Diagnosis     PT Problem List  PT Treatment Interventions     PT Goals (Current goals can be found in the Care Plan section)      Frequency     Barriers to discharge        Co-evaluation               End of Session Equipment Utilized During Treatment: Gait belt Activity Tolerance: Patient tolerated treatment well Patient left: in bed;with call bell/phone within reach;with family/visitor present Nurse Communication: Mobility status    Functional Assessment Tool Used: clinical judgement Functional Limitation: Mobility: Walking and moving around Mobility: Walking and Moving Around  Current Status (T0160): 0 percent impaired, limited or restricted Mobility: Walking and Moving Around Discharge Status [phone]): 0 percent impaired, limited or restricted    Time: 1600-1610 PT Time Calculation (min): 10 min   Charges:   PT Evaluation $Initial PT Evaluation Tier I: 1 Procedure PT Treatments $Gait Training: 8-22 mins   PT G Codes:   Functional Assessment Tool Used: clinical judgement Functional Limitation: Mobility: Walking and moving around    [facility] 05/06/2014, 4:12 PM [employer] [place] [phone] (pager)

## 2014-05-06 NOTE — Progress Notes (Signed)
Echocardiogram 2D Echocardiogram has been performed.  Alyson Locket Nicolaus Andel 05/06/2014, 3:13 PM

## 2014-05-17 ENCOUNTER — Ambulatory Visit (INDEPENDENT_AMBULATORY_CARE_PROVIDER_SITE_OTHER): Payer: BC Managed Care – PPO | Admitting: Internal Medicine

## 2014-05-17 ENCOUNTER — Encounter: Payer: Self-pay | Admitting: Internal Medicine

## 2014-05-17 VITALS — BP 120/70 | HR 74 | Temp 97.8°F | Wt 248.0 lb

## 2014-05-17 DIAGNOSIS — I2510 Atherosclerotic heart disease of native coronary artery without angina pectoris: Secondary | ICD-10-CM

## 2014-05-17 DIAGNOSIS — R55 Syncope and collapse: Secondary | ICD-10-CM

## 2014-05-17 NOTE — Progress Notes (Signed)
Subjective:    Patient ID: [name], male    DOB: 13-Feb-1956, 58 y.o.   MRN: [id number]  HPI Had 2 days in the hospital Passed out--found to be orthostatic Weight was down--had been working outside a lot that week Some soreness in back--no major injury Doesn't eat much---once a day usually. Now trying to eat twice a day Drinks a lot of drinks--- diet coke in past, but now more water and gatorade  No major dizziness since discharge No chest pain Breathing is the same  Current Outpatient Prescriptions on File Prior to Visit  Medication Sig Dispense Refill  . albuterol (PROVENTIL HFA;VENTOLIN HFA) 108 (90 BASE) MCG/ACT inhaler Inhale 2 puffs into the lungs 4 (four) times daily as needed.  18 g  3  . albuterol (PROVENTIL) (5 MG/ML) 0.5% nebulizer solution Take 0.5 mLs (2.5 mg total) by nebulization every 2 (two) hours as needed for wheezing or shortness of breath.  20 mL  0  . aspirin 81 MG tablet Take 81 mg by mouth daily.       Marland Kitchen atorvastatin (LIPITOR) 10 MG tablet Take 10 mg by mouth every evening.      . bisoprolol (ZEBETA) 10 MG tablet Take 0.5 tablets (5 mg total) by mouth daily.  30 tablet  6  . enalapril (VASOTEC) 20 MG tablet Take 1 tablet (20 mg total) by mouth daily.  30 tablet  9  . gemfibrozil (LOPID) 600 MG tablet Take 1 tablet (600 mg total) by mouth 2 (two) times daily before a meal.  60 tablet  6  . isosorbide mononitrate (ISMO,MONOKET) 20 MG tablet Take 1 tablet (20 mg total) by mouth 2 (two) times daily.  60 tablet  5  . multivitamin (THERAGRAN) per tablet Take 1 tablet by mouth daily.       . vitamin B-12 (CYANOCOBALAMIN) 1000 MCG tablet Take 1,000 mcg by mouth daily.        No current facility-administered medications on file prior to visit.    No Known Allergies  Past Medical History  Diagnosis Date  . Diabetes mellitus     type 2  . GERD (gastroesophageal reflux disease)     with esophagitis  . PVD (peripheral vascular disease)     bilateral common  iliac artery aneurysms. Right SFA occlusion over a long segment. Left SFA disease with occlusion of the left TP trunk. Artirogram Oct. 2006  . History of colonic polyps   . Diverticulosis of colon   . Sleep apnea   . Non-ischemic cardiomyopathy   . Coronary artery disease      LAD a 60-70% stenosis, moderate size second diagonal with 50% stenosis, circumflex AV groove 75-80% stenosis with a branching obtuse marginal with a superior branch subtotal stenosis followed by diffuse disease, right coronary artery  had nonobstructive disease.  EF was 35%  . COPD (chronic obstructive pulmonary disease)   . Tobacco abuse   . Urinary incontinence     detrussor instability    Past Surgical History  Procedure Laterality Date  . S/p icd placement       Medtronic Maximo [phone] single chamber  . Colonoscopy  04/2005  . Cardiac catheterization  06/2004    negative  . Pacemaker insertion  11/2005  . Copd exacerbation      Family History  Problem Relation Age of Onset  . Stroke Father   . Coronary artery disease Maternal Aunt   . Heart failure Maternal Aunt   . Lung  cancer Maternal Aunt   . Lung cancer Maternal Uncle   . Cancer Brother     Mouth  . Cancer Sister     throat    History   Social History  . Marital Status: Married    Spouse Name: N/A    Number of Children: N/A  . Years of Education: N/A   Occupational History  . Not on file.   Social History Main Topics  . Smoking status: Current Every Day Smoker -- 2.00 packs/day for 40 years    Types: Cigarettes  . Smokeless tobacco: Never Used     Comment: GAVE 1-800-QUIT-NOW  . Alcohol Use: No  . Drug Use: No  . Sexual Activity: Not on file   Other Topics Concern  . Not on file   Social History Narrative   No living will   Requests wife as health care POA   Has defibrillator but otherwise requests DNR-- written 10/29/13   No tube feeds if cognitively unaware   Review of Systems Doesn't monitor weight at home      Objective:   Physical Exam  Constitutional: He appears well-developed and well-nourished. No distress.  Neck: Normal range of motion. Neck supple.  Cardiovascular: Normal rate, regular rhythm and normal heart sounds.  Exam reveals no gallop.   No murmur heard. Pulmonary/Chest: Effort normal and breath sounds normal. No respiratory distress. He has no wheezes. He has no rales.  Musculoskeletal: He exhibits no edema and no tenderness.  Lymphadenopathy:    He has no cervical adenopathy.          Assessment & Plan:

## 2014-05-17 NOTE — Progress Notes (Signed)
Pre visit review using our clinic review tool, if applicable. No additional management support is needed unless otherwise documented below in the visit note. 

## 2014-05-17 NOTE — Assessment & Plan Note (Signed)
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

## 2014-06-02 ENCOUNTER — Encounter: Payer: Self-pay | Admitting: Cardiology

## 2014-06-02 ENCOUNTER — Ambulatory Visit (INDEPENDENT_AMBULATORY_CARE_PROVIDER_SITE_OTHER): Payer: BC Managed Care – PPO | Admitting: Cardiology

## 2014-06-02 VITALS — BP 138/88 | HR 66 | Ht 71.0 in | Wt 241.0 lb

## 2014-06-02 DIAGNOSIS — I6529 Occlusion and stenosis of unspecified carotid artery: Secondary | ICD-10-CM

## 2014-06-02 DIAGNOSIS — I2510 Atherosclerotic heart disease of native coronary artery without angina pectoris: Secondary | ICD-10-CM | POA: Diagnosis not present

## 2014-06-02 NOTE — Progress Notes (Signed)
HPI The patient presents for follow up of CAD.   In 2012 he had a stress test indicating  anterior ischemia mild but from base to apex.  I sent him for a cath demonstrating LAD a 60-70% stenosis, moderate size second diagonal with 50% stenosis, circumflex AV groove 75-80% stenosis with a branching obtuse marginal with a superior branch subtotal stenosis followed by diffuse disease, right coronary artery  had nonobstructive disease.  EF was 35%.   He was managed medically.  He was hospitalized recently and I did review his records. He had syncope. However, he had been outside working and he and not hydrating. He did have an echocardiogram which was unchanged. Carotid Dopplers are described below. It was felt that this was likely related to dehydration and he was gently hydrated. He has been drinking more water since then. He denies any further cardiovascular symptoms. He has had no orthostasis, presyncope or syncope. He denies any chest pressure, neck or arm discomfort. He has no new shortness of breath, PND or orthopnea. He continues to smoke.   No Known Allergies  Current Outpatient Prescriptions  Medication Sig Dispense Refill  . albuterol (PROVENTIL HFA;VENTOLIN HFA) 108 (90 BASE) MCG/ACT inhaler Inhale 2 puffs into the lungs 4 (four) times daily as needed.  18 g  3  . albuterol (PROVENTIL) (5 MG/ML) 0.5% nebulizer solution Take 0.5 mLs (2.5 mg total) by nebulization every 2 (two) hours as needed for wheezing or shortness of breath.  20 mL  0  . aspirin 81 MG tablet Take 81 mg by mouth daily.       Marland Kitchen atorvastatin (LIPITOR) 10 MG tablet Take 10 mg by mouth every evening.      . bisoprolol (ZEBETA) 10 MG tablet Take 0.5 tablets (5 mg total) by mouth daily.  30 tablet  6  . enalapril (VASOTEC) 20 MG tablet Take 1 tablet (20 mg total) by mouth daily.  30 tablet  9  . gemfibrozil (LOPID) 600 MG tablet Take 1 tablet (600 mg total) by mouth 2 (two) times daily before a meal.  60 tablet  6  .  isosorbide mononitrate (ISMO,MONOKET) 20 MG tablet Take 1 tablet (20 mg total) by mouth 2 (two) times daily.  60 tablet  5  . multivitamin (THERAGRAN) per tablet Take 1 tablet by mouth daily.       . vitamin B-12 (CYANOCOBALAMIN) 1000 MCG tablet Take 1,000 mcg by mouth daily.        No current facility-administered medications for this visit.    Past Medical History  Diagnosis Date  . Diabetes mellitus     type 2  . GERD (gastroesophageal reflux disease)     with esophagitis  . PVD (peripheral vascular disease)     bilateral common iliac artery aneurysms. Right SFA occlusion over a long segment. Left SFA disease with occlusion of the left TP trunk. Artirogram Oct. 2006  . History of colonic polyps   . Diverticulosis of colon   . Sleep apnea   . Non-ischemic cardiomyopathy   . Coronary artery disease      LAD a 60-70% stenosis, moderate size second diagonal with 50% stenosis, circumflex AV groove 75-80% stenosis with a branching obtuse marginal with a superior branch subtotal stenosis followed by diffuse disease, right coronary artery  had nonobstructive disease.  EF was 35%  . COPD (chronic obstructive pulmonary disease)   . Tobacco abuse   . Urinary incontinence     detrussor instability  Past Surgical History  Procedure Laterality Date  . S/p icd placement       Medtronic Maximo [phone] single chamber  . Colonoscopy  04/2005  . Cardiac catheterization  06/2004    negative  . Pacemaker insertion  11/2005  . Copd exacerbation      ROS:  Leg cramps.  Otherwise as stated in the HPI and negative for all other systems.  PHYSICAL EXAM BP 138/88  Pulse 66  Ht 5\' 11"  (1.803 m)  Wt 241 lb (109.317 kg)  BMI 33.63 kg/m2  SpO2 96% GENERAL:  Well appearing NECK:  No jugular venous distention, waveform within normal limits, carotid upstroke brisk and symmetric, no bruits, no thyromegaly LUNGS:  Clear to auscultation bilaterally BACK:  No CVA tenderness CHEST: ICD pocket HEART:   PMI not displaced or sustained,S1 and S2 within normal limits, no S3, no S4, no clicks, no rubs, no murmurs ABD:  Flat, positive bowel sounds normal in frequency in pitch, no bruits, no rebound, no guarding, no midline pulsatile mass, no hepatomegaly, no splenomegaly, obese EXT:  2 plus pulses upper, absent DP/PT, no edema, no cyanosis no clubbing, right wrist OK  ASSESSMENT AND PLAN  CAD:   The patient has no new sypmtoms.  No further cardiovascular testing is indicated.  We will continue with aggressive risk reduction and meds as listed.  He can stop his Plavix.   CAROTID STENOSIS:  He had 60 - 79% on the left.  We will follow up in six months.   TOBACCO ABUSE:  He will not quit smoking and we talked about this today (again).  CARDIOMYOPATHY:  He seems to be euvolemic and the EF was unchanged recently.  At this point, no change in therapy is indicated.    DYSLIPIDEMIA:  I did review his lipid profile as below. His triglycerides were elevated but he was otherwise at target. He will continue on the meds as listed.  Lab Results  Component Value Date   CHOL 140 10/29/2013   TRIG 233.0* 10/29/2013   HDL 38.10* 10/29/2013   LDLCALC NOT CALC mg/dL 05/27/2008   LDLDIRECT 57.3 10/29/2013

## 2014-06-02 NOTE — Patient Instructions (Signed)
The current medical regimen is effective;  continue present plan and medications.  Your physician has requested that you have a carotid duplex in 6 months. This test is an ultrasound of the carotid arteries in your neck. It looks at blood flow through these arteries that supply the brain with blood. Allow one hour for this exam. There are no restrictions or special instructions.  Follow up in 6 months with Dr Percival Spanish.  You will receive a letter in the mail 2 months before you are due.  Please call us when you receive this letter to schedule your follow up appointment.

## 2014-06-09 ENCOUNTER — Encounter: Payer: Self-pay | Admitting: Internal Medicine

## 2014-07-01 ENCOUNTER — Other Ambulatory Visit: Payer: Self-pay | Admitting: Internal Medicine

## 2014-08-11 ENCOUNTER — Telehealth: Payer: Self-pay | Admitting: Internal Medicine

## 2014-08-11 NOTE — Telephone Encounter (Signed)
Wife has heard beep past two nights. Device was real close to ERI. Pt scheduled with [facility] for 09-01-14 and wife also aware that alert tone will still be heard until pt is seen in office. Wife was also advised that if hears alerts every 4 hours instead of once daily then to call back to clinic (meaning lead issue). Wife aware of appt.

## 2014-08-11 NOTE — Telephone Encounter (Signed)
New message           Pt defib went off about a week ago / made pt an appt for Monday at [facility] / is this ok?

## 2014-08-11 NOTE — Telephone Encounter (Signed)
LMOM for pt to return call/kwm  

## 2014-09-01 ENCOUNTER — Encounter: Payer: Self-pay | Admitting: Internal Medicine

## 2014-09-01 ENCOUNTER — Ambulatory Visit (INDEPENDENT_AMBULATORY_CARE_PROVIDER_SITE_OTHER): Payer: BC Managed Care – PPO | Admitting: Internal Medicine

## 2014-09-01 VITALS — BP 96/62 | HR 77 | Ht 70.0 in | Wt 239.0 lb

## 2014-09-01 DIAGNOSIS — I5022 Chronic systolic (congestive) heart failure: Secondary | ICD-10-CM | POA: Diagnosis not present

## 2014-09-01 DIAGNOSIS — Z95810 Presence of automatic (implantable) cardiac defibrillator: Secondary | ICD-10-CM

## 2014-09-01 DIAGNOSIS — I428 Other cardiomyopathies: Secondary | ICD-10-CM

## 2014-09-01 DIAGNOSIS — M6281 Muscle weakness (generalized): Secondary | ICD-10-CM

## 2014-09-01 DIAGNOSIS — I2510 Atherosclerotic heart disease of native coronary artery without angina pectoris: Secondary | ICD-10-CM

## 2014-09-01 MED ORDER — ENALAPRIL MALEATE 20 MG PO TABS: 10.0000 mg | ORAL_TABLET | Freq: Every day | ORAL | Status: DC

## 2014-09-01 NOTE — Progress Notes (Signed)
HPI [name] returns today for followup after a long absence from our arrhythmia clinic. He is a 58 year old man with a history of a nonischemic cardiomyopathy as well as coronary artery disease which is modest. He has chronic systolic heart failure, class II as well as morbid obesity. Over the last 6 months, the patient is to increase his physical activity, by working in the garden. He is lost over 20 pounds, and he has gone from being very sedentary to fairly active, working in his garden for several hours in duration.  He denies chest pain or syncope. No recent ICD shock. He had a 2D echo which demonstrated improvement in his EF to 40%. He does note fatigue and muscle weakness.  No Known Allergies   Current Outpatient Prescriptions  Medication Sig Dispense Refill  . albuterol (PROVENTIL HFA;VENTOLIN HFA) 108 (90 BASE) MCG/ACT inhaler Inhale 2 puffs into the lungs 4 (four) times daily as needed for wheezing or shortness of breath.      Marland Kitchen albuterol (PROVENTIL) (5 MG/ML) 0.5% nebulizer solution Take 0.5 mLs (2.5 mg total) by nebulization every 2 (two) hours as needed for wheezing or shortness of breath.  20 mL  0  . aspirin 81 MG tablet Take 81 mg by mouth daily.       Marland Kitchen atorvastatin (LIPITOR) 10 MG tablet Take 10 mg by mouth every evening.      . bisoprolol (ZEBETA) 10 MG tablet Take 0.5 tablets (5 mg total) by mouth daily.  30 tablet  6  . enalapril (VASOTEC) 20 MG tablet Take 1 tablet (20 mg total) by mouth daily.  30 tablet  9  . gemfibrozil (LOPID) 600 MG tablet TAKE 1 TABLET BY MOUTH TWICE A DAY BEFORE A MEAL  60 tablet  0  . isosorbide mononitrate (ISMO,MONOKET) 20 MG tablet Take 1 tablet (20 mg total) by mouth 2 (two) times daily.  60 tablet  5  . multivitamin (THERAGRAN) per tablet Take 1 tablet by mouth daily.       . vitamin B-12 (CYANOCOBALAMIN) 1000 MCG tablet Take 1,000 mcg by mouth daily.        No current facility-administered medications for this visit.     Past  Medical History  Diagnosis Date  . Diabetes mellitus     type 2  . GERD (gastroesophageal reflux disease)     with esophagitis  . PVD (peripheral vascular disease)     bilateral common iliac artery aneurysms. Right SFA occlusion over a long segment. Left SFA disease with occlusion of the left TP trunk. Artirogram Oct. 2006  . History of colonic polyps   . Diverticulosis of colon   . Sleep apnea   . Non-ischemic cardiomyopathy   . Coronary artery disease      LAD a 60-70% stenosis, moderate size second diagonal with 50% stenosis, circumflex AV groove 75-80% stenosis with a branching obtuse marginal with a superior branch subtotal stenosis followed by diffuse disease, right coronary artery  had nonobstructive disease.  EF was 35%  . COPD (chronic obstructive pulmonary disease)   . Tobacco abuse   . Urinary incontinence     detrussor instability    ROS:   All systems reviewed and negative except as noted in the HPI.   Past Surgical History  Procedure Laterality Date  . S/p icd placement       Medtronic Maximo [phone] single chamber  . Colonoscopy  04/2005  . Cardiac catheterization  06/2004  negative  . Pacemaker insertion  11/2005  . Copd exacerbation       Family History  Problem Relation Age of Onset  . Stroke Father   . Coronary artery disease Maternal Aunt   . Heart failure Maternal Aunt   . Lung cancer Maternal Aunt   . Lung cancer Maternal Uncle   . Cancer Brother     Mouth  . Cancer Sister     throat     History   Social History  . Marital Status: Married    Spouse Name: N/A    Number of Children: N/A  . Years of Education: N/A   Occupational History  . Not on file.   Social History Main Topics  . Smoking status: Current Every Day Smoker -- 2.00 packs/day for 40 years    Types: Cigarettes  . Smokeless tobacco: Never Used     Comment: GAVE 1-800-QUIT-NOW  . Alcohol Use: No  . Drug Use: No  . Sexual Activity: Not on file   Other Topics Concern   . Not on file   Social History Narrative   No living will   Requests wife as health care POA   Has defibrillator but otherwise requests DNR-- written 10/29/13   No tube feeds if cognitively unaware     BP 96/62  Pulse 77  Ht 5\' 10"  (1.778 m)  Wt 239 lb (108.41 kg)  BMI 34.29 kg/m2  Physical Exam:  stable appearing obese, middle-aged man,NAD HEENT: Unremarkable Neck:  7 cm JVD, no thyromegally Back:  No CVA tenderness Lungs:  Clear except for rales in the bases bilaterally. No wheezes or rhonchi, and no increased work of breathing. Well-healed ICD incision. HEART:  Regular rate rhythm, no murmurs, no rubs, no clicks Abd:  soft, positive bowel sounds, no organomegally, no rebound, no guarding Ext:  2 plus pulses, no edema, no cyanosis, no clubbing Skin:  No rashes no nodules Neuro:  CN II through XII intact, motor grossly intact  EKG - normal sinus rhythm with nonspecific T wave abnormality.  DEVICE  Normal device function.  See PaceArt for details. Device is at [facility].  Assess/Plan:

## 2014-09-01 NOTE — Assessment & Plan Note (Signed)
The etiology is unclear. I have asked that we check TSH and CK.

## 2014-09-01 NOTE — Assessment & Plan Note (Signed)
His most recent echo shows an EF of 40%. His ICD has never fired. I discussed the treatment options with the patient. I have recommend removal of his device and not inserting a new one. I also offered him removal of his old ICD lead. His young age would suggest a benefit from removing everything particularly with his young age.

## 2014-09-01 NOTE — Patient Instructions (Addendum)
Will have ICD extracted  I will call you once I have a date  Your physician has recommended you make the following change in your medication:  1) Decrease Enalapril to 10mg  daily   1/2 of a 20mg  tablet

## 2014-09-02 LAB — MDC_IDC_ENUM_SESS_TYPE_INCLINIC
Battery Voltage: 2.62 V
Date Time Interrogation Session: 20150910144439
HIGH POWER IMPEDANCE MEASURED VALUE: 52 Ohm
HIGH POWER IMPEDANCE MEASURED VALUE: 52 Ohm
HIGH POWER IMPEDANCE MEASURED VALUE: 52 Ohm
HIGH POWER IMPEDANCE MEASURED VALUE: 52 Ohm
HIGH POWER IMPEDANCE MEASURED VALUE: 52 Ohm
HIGH POWER IMPEDANCE MEASURED VALUE: 53 Ohm
HIGH POWER IMPEDANCE MEASURED VALUE: 54 Ohm
HIGH POWER IMPEDANCE MEASURED VALUE: 66 Ohm
HIGH POWER IMPEDANCE MEASURED VALUE: 66 Ohm
HIGH POWER IMPEDANCE MEASURED VALUE: 67 Ohm
HIGH POWER IMPEDANCE MEASURED VALUE: 68 Ohm
HIGH POWER IMPEDANCE MEASURED VALUE: 70 Ohm
HighPow Impedance: 46 Ohm
HighPow Impedance: 50 Ohm
HighPow Impedance: 50 Ohm
HighPow Impedance: 50 Ohm
HighPow Impedance: 51 Ohm
HighPow Impedance: 52 Ohm
HighPow Impedance: 52 Ohm
HighPow Impedance: 52 Ohm
HighPow Impedance: 52 Ohm
HighPow Impedance: 65 Ohm
HighPow Impedance: 67 Ohm
HighPow Impedance: 67 Ohm
HighPow Impedance: 67 Ohm
HighPow Impedance: 67 Ohm
HighPow Impedance: 68 Ohm
HighPow Impedance: 68 Ohm
HighPow Impedance: 68 Ohm
HighPow Impedance: 69 Ohm
HighPow Impedance: 70 Ohm
Lead Channel Impedance Value: 384 Ohm
Lead Channel Impedance Value: 384 Ohm
Lead Channel Impedance Value: 400 Ohm
Lead Channel Impedance Value: 400 Ohm
Lead Channel Impedance Value: 400 Ohm
Lead Channel Impedance Value: 408 Ohm
Lead Channel Impedance Value: 416 Ohm
Lead Channel Impedance Value: 448 Ohm
Lead Channel Impedance Value: 456 Ohm
Lead Channel Impedance Value: 456 Ohm
Lead Channel Impedance Value: 576 Ohm
Lead Channel Sensing Intrinsic Amplitude: 7.3 mV
Lead Channel Sensing Intrinsic Amplitude: 7.7 mV
Lead Channel Sensing Intrinsic Amplitude: 7.8 mV
Lead Channel Sensing Intrinsic Amplitude: 7.9 mV
Lead Channel Sensing Intrinsic Amplitude: 8 mV
Lead Channel Sensing Intrinsic Amplitude: 8.1 mV
Lead Channel Sensing Intrinsic Amplitude: 8.3 mV
Lead Channel Sensing Intrinsic Amplitude: 8.3 mV
Lead Channel Sensing Intrinsic Amplitude: 8.9 mV
Lead Channel Sensing Intrinsic Amplitude: 9.3 mV
Lead Channel Setting Sensing Sensitivity: 0.3 mV
MDC IDC MSMT LEADCHNL RV IMPEDANCE VALUE: 384 Ohm
MDC IDC MSMT LEADCHNL RV IMPEDANCE VALUE: 392 Ohm
MDC IDC MSMT LEADCHNL RV IMPEDANCE VALUE: 472 Ohm
MDC IDC MSMT LEADCHNL RV IMPEDANCE VALUE: 536 Ohm
MDC IDC MSMT LEADCHNL RV SENSING INTR AMPL: 7.2 mV
MDC IDC MSMT LEADCHNL RV SENSING INTR AMPL: 7.5 mV
MDC IDC MSMT LEADCHNL RV SENSING INTR AMPL: 7.9 mV
MDC IDC MSMT LEADCHNL RV SENSING INTR AMPL: 8.1 mV
MDC IDC MSMT LEADCHNL RV SENSING INTR AMPL: 8.7 mV
MDC IDC SET LEADCHNL RV PACING AMPLITUDE: 3 V
MDC IDC SET LEADCHNL RV PACING PULSEWIDTH: 0.4 ms
MDC IDC SET ZONE DETECTION INTERVAL: 300 ms
MDC IDC STAT BRADY RV PERCENT PACED: 0 %
Zone Setting Detection Interval: 240 ms
Zone Setting Detection Interval: 340 ms

## 2014-09-05 ENCOUNTER — Encounter: Payer: Self-pay | Admitting: Internal Medicine

## 2014-09-13 ENCOUNTER — Telehealth: Payer: Self-pay | Admitting: Internal Medicine

## 2014-09-13 NOTE — Telephone Encounter (Signed)
New message  Pt wife called to discuss the offer to have his old ICD removed. Pt is ready to schedule

## 2014-09-14 NOTE — Telephone Encounter (Signed)
Will schedule for 10/26 at 1pm  Be at the hospital 11:00am [facility]. NPO after midnight and will have labs drawn at the hospital

## 2014-09-21 NOTE — Telephone Encounter (Signed)
lmom for wife and let her know for patient to hold his ASA 3 days prior to procedure

## 2014-10-13 ENCOUNTER — Encounter (HOSPITAL_COMMUNITY)
Admission: RE | Admit: 2014-10-13 | Discharge: 2014-10-13 | Disposition: A | Discharge status: Home / Self Care | Payer: BC Managed Care – PPO | Source: Ambulatory Visit | Attending: Internal Medicine | Admitting: Internal Medicine

## 2014-10-13 ENCOUNTER — Encounter (HOSPITAL_COMMUNITY): Payer: Self-pay

## 2014-10-13 DIAGNOSIS — Z01812 Encounter for preprocedural laboratory examination: Secondary | ICD-10-CM | POA: Insufficient documentation

## 2014-10-13 DIAGNOSIS — Z950 Presence of cardiac pacemaker: Secondary | ICD-10-CM | POA: Diagnosis not present

## 2014-10-13 DIAGNOSIS — Z95810 Presence of automatic (implantable) cardiac defibrillator: Secondary | ICD-10-CM | POA: Diagnosis not present

## 2014-10-13 HISTORY — DX: Presence of automatic (implantable) cardiac defibrillator: Z95.810

## 2014-10-13 HISTORY — DX: Unspecified osteoarthritis, unspecified site: M19.90

## 2014-10-13 HISTORY — DX: Shortness of breath: R06.02

## 2014-10-13 LAB — BASIC METABOLIC PANEL
Anion gap: 13 (ref 5–15)
BUN: 8 mg/dL (ref 6–23)
CHLORIDE: 99 meq/L (ref 96–112)
CO2: 25 mEq/L (ref 19–32)
Calcium: 9.4 mg/dL (ref 8.4–10.5)
Creatinine, Ser: 0.77 mg/dL (ref 0.50–1.35)
GFR calc non Af Amer: >90 mL/min (ref >90)
GLUCOSE: 72 mg/dL (ref 70–99)
Potassium: 4.4 mEq/L (ref 3.7–5.3)
Sodium: 137 mEq/L (ref 137–147)

## 2014-10-13 LAB — CBC
HCT: 44.1 % (ref 39.0–52.0)
Hemoglobin: 15.5 g/dL (ref 13.0–17.0)
MCH: 29.3 pg (ref 26.0–34.0)
MCHC: 35.1 g/dL (ref 30.0–36.0)
MCV: 83.4 fL (ref 78.0–100.0)
Platelets: 275 10*3/uL (ref 150–400)
RBC: 5.29 MIL/uL (ref 4.22–5.81)
RDW: 13.2 % (ref 11.5–15.5)
WBC: 9.1 10*3/uL (ref 4.0–10.5)

## 2014-10-13 NOTE — Pre-Procedure Instructions (Signed)
SAAFIR ABDULLAH  10/13/2014   Your procedure is scheduled on:  Monday, Oct. 26  Report to [facility] Main Entrance "A" at  11:00 AM.  Call this number if you have problems the morning of surgery: [phone]               If you have any questions prior to the surgery day please call pre-admission at [phone]   Remember:   Do not eat food or drink liquids after midnight.   Take these medicines the morning of surgery with A SIP OF WATER: albuterol if needed (bring to the hospital)                                                                                                                    Do not wear jewelry, make-up or nail polish.  Do not wear lotions, powders, or perfumes. You may wear deodorant.  Do not shave 48 hours prior to surgery. Men may shave face and neck.  Do not bring valuables to the hospital.  [facility] is not responsible                  for any belongings or valuables.               Contacts, dentures or bridgework may not be worn into surgery.  Leave suitcase in the car. After surgery it may be brought to your room.  For patients admitted to the hospital, discharge time is determined by your                treatment team.               Patients discharged the day of surgery will not be allowed to drive  home.  Name and phone number of your driver:   Special Instructions: review preparing for surgery handout   Please read over the following fact sheets that you were given: Pain Booklet and Coughing and Deep Breathing

## 2014-10-13 NOTE — Progress Notes (Addendum)
No orders at pre-admission. Erica called office for orders. Pt. Has ICD/pacemaker (medtronic). Cardiologist: Dr. Alda Lea with [employer] rep, Dannial Monarch  [phone], made her aware of exact procedure.  DA

## 2014-10-17 ENCOUNTER — Inpatient Hospital Stay (HOSPITAL_COMMUNITY): Payer: BC Managed Care – PPO | Admitting: Anesthesiology

## 2014-10-17 ENCOUNTER — Encounter (HOSPITAL_COMMUNITY)
Admission: RE | Disposition: A | Discharge status: Home / Self Care | Payer: Self-pay | Source: Ambulatory Visit | Attending: Internal Medicine

## 2014-10-17 ENCOUNTER — Observation Stay (HOSPITAL_COMMUNITY)
Admission: RE | Admit: 2014-10-17 | Discharge: 2014-10-18 | Disposition: A | Discharge status: Home / Self Care | Payer: BC Managed Care – PPO | Source: Ambulatory Visit | Attending: Internal Medicine | Admitting: Internal Medicine

## 2014-10-17 ENCOUNTER — Encounter (HOSPITAL_COMMUNITY): Payer: BC Managed Care – PPO | Admitting: Vascular Surgery

## 2014-10-17 ENCOUNTER — Encounter (HOSPITAL_COMMUNITY): Payer: Self-pay | Admitting: Anesthesiology

## 2014-10-17 DIAGNOSIS — Z79899 Other long term (current) drug therapy: Secondary | ICD-10-CM | POA: Diagnosis not present

## 2014-10-17 DIAGNOSIS — Z6834 Body mass index (BMI) 34.0-34.9, adult: Secondary | ICD-10-CM | POA: Insufficient documentation

## 2014-10-17 DIAGNOSIS — E119 Type 2 diabetes mellitus without complications: Secondary | ICD-10-CM | POA: Insufficient documentation

## 2014-10-17 DIAGNOSIS — Z95810 Presence of automatic (implantable) cardiac defibrillator: Secondary | ICD-10-CM

## 2014-10-17 DIAGNOSIS — I5022 Chronic systolic (congestive) heart failure: Secondary | ICD-10-CM | POA: Insufficient documentation

## 2014-10-17 DIAGNOSIS — Z4502 Encounter for adjustment and management of automatic implantable cardiac defibrillator: Secondary | ICD-10-CM | POA: Diagnosis not present

## 2014-10-17 DIAGNOSIS — I429 Cardiomyopathy, unspecified: Secondary | ICD-10-CM | POA: Diagnosis not present

## 2014-10-17 DIAGNOSIS — K219 Gastro-esophageal reflux disease without esophagitis: Secondary | ICD-10-CM | POA: Insufficient documentation

## 2014-10-17 DIAGNOSIS — F17210 Nicotine dependence, cigarettes, uncomplicated: Secondary | ICD-10-CM | POA: Diagnosis not present

## 2014-10-17 DIAGNOSIS — J449 Chronic obstructive pulmonary disease, unspecified: Secondary | ICD-10-CM | POA: Insufficient documentation

## 2014-10-17 DIAGNOSIS — M1990 Unspecified osteoarthritis, unspecified site: Secondary | ICD-10-CM | POA: Insufficient documentation

## 2014-10-17 DIAGNOSIS — I739 Peripheral vascular disease, unspecified: Secondary | ICD-10-CM | POA: Insufficient documentation

## 2014-10-17 DIAGNOSIS — G4730 Sleep apnea, unspecified: Secondary | ICD-10-CM | POA: Insufficient documentation

## 2014-10-17 DIAGNOSIS — Z7982 Long term (current) use of aspirin: Secondary | ICD-10-CM | POA: Diagnosis not present

## 2014-10-17 HISTORY — PX: PACEMAKER LEAD REMOVAL: SHX5064

## 2014-10-17 HISTORY — DX: Essential (primary) hypertension: I10

## 2014-10-17 LAB — TYPE AND SCREEN
ABO/RH(D): O POS
Antibody Screen: NEGATIVE

## 2014-10-17 LAB — GLUCOSE, CAPILLARY
GLUCOSE-CAPILLARY: 99 mg/dL (ref 70–99)
Glucose-Capillary: 100 mg/dL — ABNORMAL HIGH (ref 70–99)

## 2014-10-17 LAB — ABO/RH: ABO/RH(D): O POS

## 2014-10-17 SURGERY — REMOVAL, ELECTRODE LEAD, CARDIAC PACEMAKER, WITHOUT REPLACEMENT
Anesthesia: General | Site: Chest

## 2014-10-17 MED ORDER — LIDOCAINE HCL (PF) 1 % IJ SOLN
INTRAMUSCULAR | Status: AC
  Filled 2014-10-17: qty 60

## 2014-10-17 MED ORDER — GEMFIBROZIL 600 MG PO TABS
600.0000 mg | ORAL_TABLET | Freq: Two times a day (BID) | ORAL | Status: DC
  Administered 2014-10-18: 600 mg via ORAL
  Filled 2014-10-17 (×3): qty 1

## 2014-10-17 MED ORDER — GLYCOPYRROLATE 0.2 MG/ML IJ SOLN
INTRAMUSCULAR | Status: AC
  Filled 2014-10-17: qty 1

## 2014-10-17 MED ORDER — EPHEDRINE SULFATE 50 MG/ML IJ SOLN
INTRAMUSCULAR | Status: AC
  Filled 2014-10-17: qty 1

## 2014-10-17 MED ORDER — LACTATED RINGERS IV SOLN
INTRAVENOUS | Status: DC | PRN
  Administered 2014-10-17: 13:00:00 via INTRAVENOUS

## 2014-10-17 MED ORDER — ONDANSETRON HCL 4 MG/2ML IJ SOLN
INTRAMUSCULAR | Status: AC
  Filled 2014-10-17: qty 2

## 2014-10-17 MED ORDER — MIDAZOLAM HCL 2 MG/2ML IJ SOLN
INTRAMUSCULAR | Status: AC
  Filled 2014-10-17: qty 2

## 2014-10-17 MED ORDER — PROPOFOL 10 MG/ML IV BOLUS
INTRAVENOUS | Status: DC | PRN
  Administered 2014-10-17: 200 mg via INTRAVENOUS

## 2014-10-17 MED ORDER — LIDOCAINE HCL (PF) 1 % IJ SOLN
INTRAMUSCULAR | Status: DC | PRN
  Administered 2014-10-17: 15 mL via INTRADERMAL

## 2014-10-17 MED ORDER — CEFAZOLIN SODIUM-DEXTROSE 2-3 GM-% IV SOLR
2.0000 g | Freq: Once | INTRAVENOUS | Status: AC
  Administered 2014-10-17: 2 g via INTRAVENOUS

## 2014-10-17 MED ORDER — ALBUTEROL SULFATE (2.5 MG/3ML) 0.083% IN NEBU: 2.5000 mg | INHALATION_SOLUTION | Freq: Four times a day (QID) | RESPIRATORY_TRACT | Status: DC | PRN

## 2014-10-17 MED ORDER — OXYCODONE HCL 5 MG PO TABS: 5.0000 mg | ORAL_TABLET | Freq: Once | ORAL | Status: DC | PRN

## 2014-10-17 MED ORDER — CETYLPYRIDINIUM CHLORIDE 0.05 % MT LIQD
7.0000 mL | Freq: Two times a day (BID) | OROMUCOSAL | Status: DC
  Administered 2014-10-17: 7 mL via OROMUCOSAL

## 2014-10-17 MED ORDER — PHENYLEPHRINE HCL 10 MG/ML IJ SOLN
10.0000 mg | INTRAMUSCULAR | Status: DC | PRN
  Administered 2014-10-17: 20 ug/min via INTRAVENOUS

## 2014-10-17 MED ORDER — FENTANYL CITRATE 0.05 MG/ML IJ SOLN
INTRAMUSCULAR | Status: DC | PRN
  Administered 2014-10-17 (×3): 50 ug via INTRAVENOUS

## 2014-10-17 MED ORDER — SODIUM CHLORIDE 0.9 % IR SOLN
Freq: Once | Status: DC
  Filled 2014-10-17: qty 2

## 2014-10-17 MED ORDER — OXYCODONE HCL 5 MG/5ML PO SOLN: 5.0000 mg | Freq: Once | ORAL | Status: DC | PRN

## 2014-10-17 MED ORDER — ARTIFICIAL TEARS OP OINT
TOPICAL_OINTMENT | OPHTHALMIC | Status: AC
  Filled 2014-10-17: qty 7

## 2014-10-17 MED ORDER — HYDROMORPHONE HCL 1 MG/ML IJ SOLN: 0.2500 mg | INTRAMUSCULAR | Status: DC | PRN

## 2014-10-17 MED ORDER — FENTANYL CITRATE 0.05 MG/ML IJ SOLN
INTRAMUSCULAR | Status: AC
  Filled 2014-10-17: qty 5

## 2014-10-17 MED ORDER — SODIUM CHLORIDE 0.9 % IR SOLN
Status: DC | PRN
  Administered 2014-10-17: 14:00:00

## 2014-10-17 MED ORDER — LACTATED RINGERS IV SOLN
INTRAVENOUS | Status: DC
  Administered 2014-10-17: 12:00:00 via INTRAVENOUS

## 2014-10-17 MED ORDER — CEFAZOLIN SODIUM 1-5 GM-% IV SOLN
1.0000 g | Freq: Four times a day (QID) | INTRAVENOUS | Status: AC
  Administered 2014-10-17 – 2014-10-18 (×3): 1 g via INTRAVENOUS
  Filled 2014-10-17 (×3): qty 50

## 2014-10-17 MED ORDER — BISOPROLOL FUMARATE 5 MG PO TABS
5.0000 mg | ORAL_TABLET | Freq: Every day | ORAL | Status: DC
  Administered 2014-10-17: 5 mg via ORAL
  Filled 2014-10-17 (×2): qty 1

## 2014-10-17 MED ORDER — PROMETHAZINE HCL 25 MG/ML IJ SOLN: 6.2500 mg | INTRAMUSCULAR | Status: DC | PRN

## 2014-10-17 MED ORDER — PROPOFOL 10 MG/ML IV BOLUS
INTRAVENOUS | Status: AC
  Filled 2014-10-17: qty 20

## 2014-10-17 MED ORDER — LIDOCAINE HCL (CARDIAC) 20 MG/ML IV SOLN
INTRAVENOUS | Status: DC | PRN
  Administered 2014-10-17: 100 mg via INTRAVENOUS

## 2014-10-17 MED ORDER — GLYCOPYRROLATE 0.2 MG/ML IJ SOLN
INTRAMUSCULAR | Status: AC
  Filled 2014-10-17: qty 3

## 2014-10-17 MED ORDER — STERILE WATER FOR INJECTION IJ SOLN
INTRAMUSCULAR | Status: AC
  Filled 2014-10-17: qty 10

## 2014-10-17 MED ORDER — SODIUM CHLORIDE 0.9 % IR SOLN
Status: DC | PRN
  Administered 2014-10-17: 13:00:00

## 2014-10-17 MED ORDER — CEFAZOLIN SODIUM-DEXTROSE 2-3 GM-% IV SOLR
INTRAVENOUS | Status: AC
  Filled 2014-10-17: qty 50

## 2014-10-17 MED ORDER — VITAMIN B-12 1000 MCG PO TABS
1000.0000 ug | ORAL_TABLET | Freq: Every day | ORAL | Status: DC
  Filled 2014-10-17: qty 1

## 2014-10-17 MED ORDER — EPHEDRINE SULFATE 50 MG/ML IJ SOLN
INTRAMUSCULAR | Status: DC | PRN
  Administered 2014-10-17: 5 mg via INTRAVENOUS
  Administered 2014-10-17 (×2): 10 mg via INTRAVENOUS
  Administered 2014-10-17: 5 mg via INTRAVENOUS

## 2014-10-17 MED ORDER — ONDANSETRON HCL 4 MG/2ML IJ SOLN
INTRAMUSCULAR | Status: DC | PRN
  Administered 2014-10-17: 4 mg via INTRAVENOUS

## 2014-10-17 MED ORDER — NEOSTIGMINE METHYLSULFATE 10 MG/10ML IV SOLN
INTRAVENOUS | Status: DC | PRN
  Administered 2014-10-17: 3 mg via INTRAVENOUS

## 2014-10-17 MED ORDER — ACETAMINOPHEN 325 MG PO TABS: 325.0000 mg | ORAL_TABLET | ORAL | Status: DC | PRN

## 2014-10-17 MED ORDER — GLYCOPYRROLATE 0.2 MG/ML IJ SOLN
INTRAMUSCULAR | Status: DC | PRN
  Administered 2014-10-17 (×2): 0.2 mg via INTRAVENOUS
  Administered 2014-10-17: 0.4 mg via INTRAVENOUS

## 2014-10-17 MED ORDER — ONDANSETRON HCL 4 MG/2ML IJ SOLN: 4.0000 mg | Freq: Four times a day (QID) | INTRAMUSCULAR | Status: DC | PRN

## 2014-10-17 MED ORDER — ALBUTEROL SULFATE HFA 108 (90 BASE) MCG/ACT IN AERS: 2.0000 | INHALATION_SPRAY | Freq: Four times a day (QID) | RESPIRATORY_TRACT | Status: DC | PRN

## 2014-10-17 MED ORDER — ISOSORBIDE MONONITRATE 20 MG PO TABS
20.0000 mg | ORAL_TABLET | Freq: Two times a day (BID) | ORAL | Status: DC
Start: 2014-10-17 — End: 2014-10-18
  Administered 2014-10-17: 20 mg via ORAL
  Filled 2014-10-17 (×3): qty 1

## 2014-10-17 MED ORDER — MIDAZOLAM HCL 5 MG/5ML IJ SOLN
INTRAMUSCULAR | Status: DC | PRN
  Administered 2014-10-17 (×2): 1 mg via INTRAVENOUS

## 2014-10-17 MED ORDER — NEOSTIGMINE METHYLSULFATE 10 MG/10ML IV SOLN
INTRAVENOUS | Status: AC
  Filled 2014-10-17: qty 1

## 2014-10-17 MED ORDER — ENALAPRIL MALEATE 10 MG PO TABS
10.0000 mg | ORAL_TABLET | Freq: Every day | ORAL | Status: DC
  Administered 2014-10-17: 10 mg via ORAL
  Filled 2014-10-17 (×2): qty 1

## 2014-10-17 MED ORDER — ARTIFICIAL TEARS OP OINT
TOPICAL_OINTMENT | OPHTHALMIC | Status: DC | PRN
  Administered 2014-10-17: 1 via OPHTHALMIC

## 2014-10-17 MED ORDER — ATORVASTATIN CALCIUM 10 MG PO TABS
10.0000 mg | ORAL_TABLET | Freq: Every evening | ORAL | Status: DC
  Administered 2014-10-17: 10 mg via ORAL
  Filled 2014-10-17 (×2): qty 1

## 2014-10-17 MED ORDER — ROCURONIUM BROMIDE 100 MG/10ML IV SOLN
INTRAVENOUS | Status: DC | PRN
  Administered 2014-10-17: 50 mg via INTRAVENOUS

## 2014-10-17 SURGICAL SUPPLY — 45 items
BAG BANDED W/RUBBER/TAPE 36X54 (MISCELLANEOUS) ×3 IMPLANT
BLADE 10 SAFETY STRL DISP (BLADE) ×3 IMPLANT
BLADE STERNUM SYSTEM 6 (BLADE) ×3 IMPLANT
BNDG COHESIVE 4X5 WHT NS (GAUZE/BANDAGES/DRESSINGS) IMPLANT
CANISTER SUCTION 2500CC (MISCELLANEOUS) ×3 IMPLANT
COIL ONE TIE COMPRESSION (MISCELLANEOUS) ×3 IMPLANT
COVER DOME SNAP 22 D (MISCELLANEOUS) ×3 IMPLANT
COVER PROBE W GEL 5X96 (DRAPES) ×3 IMPLANT
COVER TABLE BACK 60X90 (DRAPES) ×3 IMPLANT
DRAPE CARDIOVASCULAR INCISE (DRAPES) ×2
DRAPE SRG 135X102X78XABS (DRAPES) ×1 IMPLANT
DRSG OPSITE 6X11 MED (GAUZE/BANDAGES/DRESSINGS) IMPLANT
DRSG TEGADERM 4X4.75 (GAUZE/BANDAGES/DRESSINGS) ×3 IMPLANT
ELECT REM PT RETURN 9FT ADLT (ELECTROSURGICAL) ×6
ELECTRODE REM PT RTRN 9FT ADLT (ELECTROSURGICAL) ×2 IMPLANT
GAUZE SPONGE 4X4 12PLY STRL (GAUZE/BANDAGES/DRESSINGS) IMPLANT
GAUZE SPONGE 4X4 16PLY XRAY LF (GAUZE/BANDAGES/DRESSINGS) IMPLANT
GLOVE BIOGEL PI IND STRL 6.5 (GLOVE) ×1 IMPLANT
GLOVE BIOGEL PI IND STRL 7.5 (GLOVE) ×2 IMPLANT
GLOVE BIOGEL PI IND STRL 8 (GLOVE) ×2 IMPLANT
GLOVE BIOGEL PI INDICATOR 6.5 (GLOVE) ×2
GLOVE BIOGEL PI INDICATOR 7.5 (GLOVE) ×4
GLOVE BIOGEL PI INDICATOR 8 (GLOVE) ×4
GLOVE ECLIPSE 8.0 STRL XLNG CF (GLOVE) ×3 IMPLANT
GOWN STRL REUS W/ TWL LRG LVL3 (GOWN DISPOSABLE) IMPLANT
GOWN STRL REUS W/ TWL XL LVL3 (GOWN DISPOSABLE) ×1 IMPLANT
GOWN STRL REUS W/TWL LRG LVL3 (GOWN DISPOSABLE)
GOWN STRL REUS W/TWL XL LVL3 (GOWN DISPOSABLE) ×2
KIT ROOM TURNOVER OR (KITS) ×3 IMPLANT
PAD ARMBOARD 7.5X6 YLW CONV (MISCELLANEOUS) ×6 IMPLANT
PAD ELECT DEFIB RADIOL ZOLL (MISCELLANEOUS) ×3 IMPLANT
SHEATH EVOLUTION RL 11F (SHEATH) ×3 IMPLANT
SHEATH EVOLUTION SHORTE RL 11F (SHEATH) ×3 IMPLANT
SPONGE GAUZE 4X4 12PLY STER LF (GAUZE/BANDAGES/DRESSINGS) ×3 IMPLANT
STYLET LIBERATOR LOCKING (MISCELLANEOUS) ×3 IMPLANT
SUT PROLENE 2 0 SH DA (SUTURE) IMPLANT
SUT VIC AB 2-0 CT1 36 (SUTURE) ×3 IMPLANT
SUT VIC AB 3-0 SH 27 (SUTURE) ×2
SUT VIC AB 3-0 SH 27X BRD (SUTURE) ×1 IMPLANT
TOWEL OR 17X24 6PK STRL BLUE (TOWEL DISPOSABLE) ×6 IMPLANT
TOWEL OR 17X26 10 PK STRL BLUE (TOWEL DISPOSABLE) ×9 IMPLANT
TRAY FOLEY IC TEMP SENS 14FR (CATHETERS) ×6 IMPLANT
TUBE CONNECTING 12'X1/4 (SUCTIONS)
TUBE CONNECTING 12X1/4 (SUCTIONS) IMPLANT
YANKAUER SUCT BULB TIP NO VENT (SUCTIONS) IMPLANT

## 2014-10-17 NOTE — Anesthesia Postprocedure Evaluation (Signed)
  Anesthesia Post-op Note  Patient: [name]  Procedure(s) Performed: Procedure(s): PACEMAKER LEAD REMOVAL (N/A)  Patient Location: PACU  Anesthesia Type:General  Level of Consciousness: awake and alert   Airway and Oxygen Therapy: Patient Spontanous Breathing  Post-op Pain: mild  Post-op Assessment: Post-op Vital signs reviewed and Patient's Cardiovascular Status Stable  Post-op Vital Signs: stable  Last Vitals:  Filed Vitals:   10/17/14 1600  BP: 129/55  Pulse: 29  Temp:   Resp: 19    Complications: No apparent anesthesia complications

## 2014-10-17 NOTE — Interval H&P Note (Signed)
History and Physical Interval Note:  10/17/2014 12:32 PM  [name]  has presented today for surgery, with the diagnosis of End of pacemaker life  The various methods of treatment have been discussed with the patient and family. After consideration of risks, benefits and other options for treatment, the patient has consented to  Procedure(s): PACEMAKER LEAD REMOVAL (N/A) as a surgical intervention .  The patient's history has been reviewed, patient examined, no change in status, stable for surgery.  I have reviewed the patient's chart and labs.  Questions were answered to the patient's satisfaction.     Mikle Bosworth.D.

## 2014-10-17 NOTE — OR Nursing (Signed)
Pacemaker Maximo VR [id number] SN: [id number] H and RV Lead Sprint Fidelis [id number] SN: [id number] V removed on 10/17/14 in OR.

## 2014-10-17 NOTE — Transfer of Care (Signed)
Immediate Anesthesia Transfer of Care Note  Patient: [name]  Procedure(s) Performed: Procedure(s): PACEMAKER LEAD REMOVAL (N/A)  Patient Location: PACU  Anesthesia Type:General  Level of Consciousness: awake, alert  and oriented  Airway & Oxygen Therapy: Patient Spontanous Breathing and Patient connected to nasal cannula oxygen  Post-op Assessment: Report given to PACU RN, Post -op Vital signs reviewed and stable and Patient moving all extremities X 4  Post vital signs: Reviewed and stable  Complications: No apparent anesthesia complications

## 2014-10-17 NOTE — CV Procedure (Signed)
EP Procedure Note  Procedure: ICD system extraction  Indication: s/p ICD with device at [facility] and the patient refusing a new implant and requesting that the original device/including his lead be removed.  Postoperative Diagnosis - Same as above  Description of the Procedure - After informed consent was obtained, the patient was taken to the operating room in the fasting state. The anesthesia service was utilized to provide general anesthesia. 30 cc of lidocaine was infiltrated in the left infraclavicular region. A 7 cm incision was carried out. Electrocautery was utilized to dissect down to the ICD pocket. The generator was removed.  The lead was disconnected from the ICD generator. The silk suture was cut from around the sewing sleeve. A locking stylette was inserted into the lead and retracted, but remained fixed in place and did not move. A liberator locking stylette was inserted into the tip of the lead. The stylette was locked in place. The [place] [address] [place]  Short sheath was inserted over the ICD lead and advanced into the junction of the subclavian vein and the innominate vein. Gentle traction was placed on the lead. The lead remained fixed in place. Next, the [address] [place] long sheath was advanced over the locking stylette and defibrillator lead. Utilizing a combination of traction, countertraction, pressure, and counterpressure, the defibrillator lead was extracted in total. The blood pressure was transiently decreased down into the 80s. A quick look 2-D echo was performed demonstrating no pericardial effusion. The patient was observed for 10 minutes. His pressure was stable. The pocket was irrigated with antibiotic irrigation. Hemostasis was assured. An incision was closed with 2 layers of Vicryl suture. He was returned to the recovery area in satisfactory condition.  Complications: There were no immediate procedural complications.   Conclusion: Successful extraction of Medtronic single  chamber ICD which had been in place for 9 years.  Crissie Sickles, M.D.

## 2014-10-17 NOTE — Anesthesia Procedure Notes (Signed)
Procedure Name: Intubation Date/Time: 10/17/2014 1:53 PM Performed by: Susa Loffler Pre-anesthesia Checklist: Patient identified, Emergency Drugs available, Suction available, Patient being monitored and Timeout performed Patient Re-evaluated:Patient Re-evaluated prior to inductionOxygen Delivery Method: Circle system utilized Preoxygenation: Pre-oxygenation with 100% oxygen Intubation Type: IV induction Ventilation: Mask ventilation without difficulty and Oral airway inserted - appropriate to patient size Laryngoscope Size: Mac and 4 Grade View: Grade II Tube type: Oral Tube size: 7.5 mm Number of attempts: 1 Airway Equipment and Method: Stylet and Oral airway Placement Confirmation: ETT inserted through vocal cords under direct vision,  positive ETCO2 and breath sounds checked- equal and bilateral Secured at: 23 cm Tube secured with: Tape Dental Injury: Teeth and Oropharynx as per pre-operative assessment

## 2014-10-17 NOTE — Anesthesia Preprocedure Evaluation (Signed)
Anesthesia Evaluation  Patient identified by MRN, date of birth, ID band Patient awake    Reviewed: Allergy & Precautions, H&P , NPO status , Patient's Chart, lab work & pertinent test results  Airway    Neck ROM: Full    Dental   Pulmonary sleep apnea , COPDCurrent Smoker,  breath sounds clear to auscultation        Cardiovascular + CAD and + Peripheral Vascular Disease + Cardiac Defibrillator Rhythm:Regular Rate:Normal     Neuro/Psych    GI/Hepatic GERD-  ,  Endo/Other  diabetesMorbid obesity  Renal/GU      Musculoskeletal  (+) Arthritis -,   Abdominal (+) + obese,   Peds  Hematology   Anesthesia Other Findings   Reproductive/Obstetrics                             Anesthesia Physical Anesthesia Plan  ASA: III  Anesthesia Plan: General   Post-op Pain Management:    Induction: Intravenous  Airway Management Planned: Oral ETT  Additional Equipment: Arterial line  Intra-op Plan:   Post-operative Plan: Extubation in OR  Informed Consent: I have reviewed the patients History and Physical, chart, labs and discussed the procedure including the risks, benefits and alternatives for the proposed anesthesia with the patient or authorized representative who has indicated his/her understanding and acceptance.   Dental advisory given  Plan Discussed with:   Anesthesia Plan Comments:         Anesthesia Quick Evaluation

## 2014-10-17 NOTE — Progress Notes (Signed)
Orthopedic Tech Progress Note Patient Details:  [name] 11/23/1956 [id number] Applied arm sling to LUE. Ortho Devices Type of Ortho Device: Arm sling Ortho Device/Splint Location: LUE Ortho Device/Splint Interventions: Application   Darrol Poke 10/17/2014, 4:24 PM

## 2014-10-17 NOTE — H&P (Signed)
HPI Mr. Aderhold returns today for ICD removal. He is a 58 year old man with a history of a nonischemic cardiomyopathy as well as coronary artery disease which is modest. He has chronic systolic heart failure, class II as well as morbid obesity. Over the last 6 months, the patient is to increase his physical activity, by working in the garden. He is lost over 20 pounds, and he has gone from being very sedentary to fairly active, working in his garden for several hours in duration.  He denies chest pain or syncope. No recent ICD shock. He had a 2D echo which demonstrated improvement in his EF to 40%. He does note fatigue and muscle weakness. He has a Medtronic [id number] lead in place.   No Known Allergies      Current Outpatient Prescriptions   Medication  Sig  Dispense  Refill   .  albuterol (PROVENTIL HFA;VENTOLIN HFA) 108 (90 BASE) MCG/ACT inhaler  Inhale 2 puffs into the lungs 4 (four) times daily as needed for wheezing or shortness of breath.         Marland Kitchen  albuterol (PROVENTIL) (5 MG/ML) 0.5% nebulizer solution  Take 0.5 mLs (2.5 mg total) by nebulization every 2 (two) hours as needed for wheezing or shortness of breath.   20 mL   0   .  aspirin 81 MG tablet  Take 81 mg by mouth daily.          Marland Kitchen  atorvastatin (LIPITOR) 10 MG tablet  Take 10 mg by mouth every evening.         .  bisoprolol (ZEBETA) 10 MG tablet  Take 0.5 tablets (5 mg total) by mouth daily.   30 tablet   6   .  enalapril (VASOTEC) 20 MG tablet  Take 1 tablet (20 mg total) by mouth daily.   30 tablet   9   .  gemfibrozil (LOPID) 600 MG tablet  TAKE 1 TABLET BY MOUTH TWICE A DAY BEFORE A MEAL   60 tablet   0   .  isosorbide mononitrate (ISMO,MONOKET) 20 MG tablet  Take 1 tablet (20 mg total) by mouth 2 (two) times daily.   60 tablet   5   .  multivitamin (THERAGRAN) per tablet  Take 1 tablet by mouth daily.          .  vitamin B-12 (CYANOCOBALAMIN) 1000 MCG tablet  Take 1,000 mcg by mouth daily.              No current  facility-administered medications for this visit.           Past Medical History   Diagnosis  Date   .  Diabetes mellitus         type 2   .  GERD (gastroesophageal reflux disease)         with esophagitis   .  PVD (peripheral vascular disease)         bilateral common iliac artery aneurysms. Right SFA occlusion over a long segment. Left SFA disease with occlusion of the left TP trunk. Artirogram Oct. 2006   .  History of colonic polyps     .  Diverticulosis of colon     .  Sleep apnea     .  Non-ischemic cardiomyopathy     .  Coronary artery disease          LAD a 60-70% stenosis, moderate size second diagonal with 50% stenosis, circumflex AV groove 75-80% stenosis with  a branching obtuse marginal with a superior branch subtotal stenosis followed by diffuse disease, right coronary artery  had nonobstructive disease.  EF was 35%  Now 40% .  COPD (chronic obstructive pulmonary disease)     .  Tobacco abuse     .  Urinary incontinence         detrussor instability     ROS:    All systems reviewed and negative except as noted in the HPI.      Past Surgical History   Procedure  Laterality  Date   .  S/p icd placement            Medtronic Maximo [phone] single chamber   .  Colonoscopy    04/2005   .  Cardiac catheterization    06/2004       negative   . ICD insertion    11/2005   .  Copd exacerbation               Family History   Problem  Relation  Age of Onset   .  Stroke  Father     .  Coronary artery disease  Maternal Aunt     .  Heart failure  Maternal Aunt     .  Lung cancer  Maternal Aunt     .  Lung cancer  Maternal Uncle     .  Cancer  Brother         Mouth   .  Cancer  Sister         throat           History       Social History   .  Marital Status:  Married       Spouse Name:  N/A       Number of Children:  N/A   .  Years of Education:  N/A       Occupational History   .  Not on file.       Social History Main Topics   .  Smoking  status:  Current Every Day Smoker -- 2.00 packs/day for 40 years       Types:  Cigarettes   .  Smokeless tobacco:  Never Used         Comment: GAVE 1-800-QUIT-NOW   .  Alcohol Use:  No   .  Drug Use:  No   .  Sexual Activity:  Not on file       Other Topics  Concern   .  Not on file       Social History Narrative     No living will     Requests wife as health care POA     Has defibrillator but otherwise requests DNR-- written 10/29/13     No tube feeds if cognitively unaware          BP 96/62  Pulse 77  Ht 5\' 10"  (1.778 m)  Wt 239 lb (108.41 kg)  BMI 34.29 kg/m2   Physical Exam:   stable appearing obese, middle-aged man,NAD HEENT: Unremarkable Neck:  7 cm JVD, no thyromegally Back:  No CVA tenderness Lungs:  Clear except for rales in the bases bilaterally. No wheezes or rhonchi, and no increased work of breathing. Well-healed ICD incision. HEART:  Regular rate rhythm, no murmurs, no rubs, no clicks Abd:  soft, positive bowel sounds, no organomegally, no rebound, no guarding Ext:  2 plus pulses, no edema, no cyanosis,  no clubbing Skin:  No rashes no nodules Neuro:  CN II through XII intact, motor grossly intact   EKG - normal sinus rhythm with nonspecific T wave abnormality.   DEVICE   Normal device function.  See PaceArt for details. Device is at [facility]   Assess/Plan:         Chronic systolic heart failure, s/p ICD implant with his device at [facility] - His most recent echo shows an EF of 40%. His ICD has never fired. I discussed the treatment options with the patient. He is not inclined to have another ICD placed. I have recommend removal of his device and not inserting a new one. I also offered him removal of his old ICD lead. His young age would suggest a benefit from removing the entire system. After reviewing the risks/benefits/goals/expectations of removal of the entire system, he would like to proceed with ICD system removal.       Mikle Bosworth.D.

## 2014-10-18 ENCOUNTER — Encounter (HOSPITAL_COMMUNITY): Payer: Self-pay | Admitting: Internal Medicine

## 2014-10-18 ENCOUNTER — Observation Stay (HOSPITAL_COMMUNITY): Payer: BC Managed Care – PPO

## 2014-10-18 DIAGNOSIS — R079 Chest pain, unspecified: Secondary | ICD-10-CM | POA: Diagnosis not present

## 2014-10-18 DIAGNOSIS — Z95810 Presence of automatic (implantable) cardiac defibrillator: Secondary | ICD-10-CM

## 2014-10-18 DIAGNOSIS — Z4502 Encounter for adjustment and management of automatic implantable cardiac defibrillator: Secondary | ICD-10-CM | POA: Diagnosis not present

## 2014-10-18 NOTE — Discharge Instructions (Signed)
Please do not get incision wet for 1 week.

## 2014-10-18 NOTE — Discharge Summary (Signed)
ELECTROPHYSIOLOGY PROCEDURE DISCHARGE SUMMARY    Patient ID: [name],  MRN: [id number], DOB/AGE: 04-21-1956 58 y.o.  Admit date: 10/17/2014 Discharge date: 10/18/2014  Primary Care Physician: Viviana Simpler, MD Primary Cardiologist: Hochrein Electrophysiologist: Lovena Le  Primary Discharge Diagnosis:  Non ischemic cardiomyopathy s/p ICD recently at elective replacement indicator with EF improved to 40% and patient desiring not to have a new ICD placed.  Pt is s/p ICD explant this admission with lead extraction. Secondary Discharge Diagnosis:  1.  Diabetes 2.  GERD 3.  PVD 4.  Sleep apnea 5.  COPD  No Known Allergies   Procedures This Admission:  1.  ICD system extraction on 10-17-14 by Dr Lovena Le.  The patient's previously implanted ICD and RV lead were extracted in total without complication.   2.  CXR on 10-18-14 demonstrated no ptx status post device extraction  Brief HPI: [name] is a 58 y.o. male with a past medical history as outlined above.  He underwent ICD implantation for primary prevention in 2006.  He has never had appropriate therapy from his device which has recently reached elective replacement indicator.  His EF has improved to 40%.  The patient wished to have his ICD explanted.  Risks, benefits, and alternatives were reviewed with the patient who wished to proceed.   Hospital Course:  The patient was admitted and underwent ICD system extraction with details as outlined above. He was monitored on telemetry overnight which demonstrated SR with PVC's.  His left chest was without hematoma or ecchymosis.  He was evaluated by Dr Lovena Le and considered stable for discharge to home.   Discharge Vitals: Blood pressure 107/67, pulse 61, temperature 98.3 F (36.8 C), temperature source Oral, resp. rate 18, height 5' 10.5" (1.791 m), weight 243 lb (110.224 kg), SpO2 98.00%.    Labs:   Lab Results  Component Value Date   WBC 9.1 10/13/2014   HGB 15.5  10/13/2014   HCT 44.1 10/13/2014   MCV 83.4 10/13/2014   PLT 275 10/13/2014    Recent Labs Lab 10/13/14 1344  NA 137  K 4.4  CL 99  CO2 25  BUN 8  CREATININE 0.77  CALCIUM 9.4  GLUCOSE 72     Discharge Medications:    Medication List    ASK your doctor about these medications       albuterol (5 MG/ML) 0.5% nebulizer solution  Commonly known as:  PROVENTIL  Take 0.5 mLs (2.5 mg total) by nebulization every 2 (two) hours as needed for wheezing or shortness of breath.     albuterol 108 (90 BASE) MCG/ACT inhaler  Commonly known as:  PROVENTIL HFA;VENTOLIN HFA  Inhale 2 puffs into the lungs 4 (four) times daily as needed for wheezing or shortness of breath.     aspirin 81 MG tablet  Take 81 mg by mouth daily.     atorvastatin 10 MG tablet  Commonly known as:  LIPITOR  Take 10 mg by mouth every evening.     bisoprolol 10 MG tablet  Commonly known as:  ZEBETA  Take 0.5 tablets (5 mg total) by mouth daily.     enalapril 20 MG tablet  Commonly known as:  VASOTEC  Take 0.5 tablets (10 mg total) by mouth daily.     gemfibrozil 600 MG tablet  Commonly known as:  LOPID  Take 600 mg by mouth 2 (two) times daily before a meal.     isosorbide mononitrate 20 MG tablet  Commonly known as:  ISMO,MONOKET  Take 1 tablet (20 mg total) by mouth 2 (two) times daily.     multivitamin per tablet  Take 1 tablet by mouth daily.     vitamin B-12 1000 MCG tablet  Commonly known as:  CYANOCOBALAMIN  Take 1,000 mcg by mouth daily.        Disposition:     Duration of Discharge Encounter: less than 30 minutes including physician time.  Signed,  Cristopher Peru, M.D.

## 2014-10-18 NOTE — Progress Notes (Signed)
Pt takes daily medications before bedtime, request to be dc'd without taking 10 am medications. [name] S 11:04 AM

## 2014-10-18 NOTE — Progress Notes (Signed)
Pt discharged home with wife Discharge instructions given & reviewed Eduction discussed  IV dc'd  Tele dc'd  Pt discharged via wheelchair with volunteer services, all pt belongs at side.  Kathleen Argue S 11:43 AM

## 2014-10-28 ENCOUNTER — Encounter: Payer: Self-pay | Admitting: Internal Medicine

## 2014-10-28 ENCOUNTER — Ambulatory Visit (INDEPENDENT_AMBULATORY_CARE_PROVIDER_SITE_OTHER): Payer: BC Managed Care – PPO | Admitting: Internal Medicine

## 2014-10-28 VITALS — BP 140/80 | HR 74 | Ht 70.0 in | Wt 249.0 lb

## 2014-10-28 DIAGNOSIS — I5022 Chronic systolic (congestive) heart failure: Secondary | ICD-10-CM | POA: Diagnosis not present

## 2014-10-28 DIAGNOSIS — E1142 Type 2 diabetes mellitus with diabetic polyneuropathy: Secondary | ICD-10-CM | POA: Diagnosis not present

## 2014-10-28 DIAGNOSIS — Z23 Encounter for immunization: Secondary | ICD-10-CM | POA: Diagnosis not present

## 2014-10-28 DIAGNOSIS — I2510 Atherosclerotic heart disease of native coronary artery without angina pectoris: Secondary | ICD-10-CM

## 2014-10-28 DIAGNOSIS — E1151 Type 2 diabetes mellitus with diabetic peripheral angiopathy without gangrene: Secondary | ICD-10-CM

## 2014-10-28 DIAGNOSIS — E1159 Type 2 diabetes mellitus with other circulatory complications: Secondary | ICD-10-CM

## 2014-10-28 DIAGNOSIS — E785 Hyperlipidemia, unspecified: Secondary | ICD-10-CM

## 2014-10-28 DIAGNOSIS — Z0000 Encounter for general adult medical examination without abnormal findings: Secondary | ICD-10-CM | POA: Diagnosis not present

## 2014-10-28 LAB — HM DIABETES FOOT EXAM

## 2014-10-28 LAB — LIPID PANEL
CHOL/HDL RATIO: 6
Cholesterol: 221 mg/dL — ABNORMAL HIGH (ref 0–200)
HDL: 39.1 mg/dL (ref >39.00)
NonHDL: 181.9
TRIGLYCERIDES: 353 mg/dL — AB (ref 0.0–149.0)
VLDL: 70.6 mg/dL — ABNORMAL HIGH (ref 0.0–40.0)

## 2014-10-28 LAB — LDL CHOLESTEROL, DIRECT: Direct LDL: 92.8 mg/dL

## 2014-10-28 LAB — HEMOGLOBIN A1C: Hgb A1c MFr Bld: 5.9 % (ref 4.6–6.5)

## 2014-10-28 NOTE — Assessment & Plan Note (Signed)
Sensory change without serious pain No Rx needed

## 2014-10-28 NOTE — Progress Notes (Signed)
Pre visit review using our clinic review tool, if applicable. No additional management support is needed unless otherwise documented below in the visit note. 

## 2014-10-28 NOTE — Assessment & Plan Note (Signed)
Compensated On appropriate meds EF up to 40%

## 2014-10-28 NOTE — Assessment & Plan Note (Signed)
Will give flu and prevnar today He prefers no cancer screening Not willing to stop smoking--- "it is my only enjoyment in life"

## 2014-10-28 NOTE — Assessment & Plan Note (Signed)
Hopefully still good control Needs eye exam

## 2014-10-28 NOTE — Assessment & Plan Note (Signed)
No angina Stable exercise tolerance---gardens, etc

## 2014-10-28 NOTE — Assessment & Plan Note (Signed)
Will check labs

## 2014-10-28 NOTE — Progress Notes (Signed)
Subjective:    Patient ID: [name], male    DOB: [birth year], 58 y.o.   MRN: [id number]  HPI Here for physical Recently had defibrillator removed EF up to 40% on last check and it never fired--so it wasn't replaced  He has no new concerns Hasn't been checking sugars No hypoglycemic reactions Hasn't seen an eye doctor Stays dizzy at times--will get better after sleeping some  No chest pain Breathing is okay Still goes out daily--tries to garden Energy levels are not as good though  Current Outpatient Prescriptions on File Prior to Visit  Medication Sig Dispense Refill  . albuterol (PROVENTIL HFA;VENTOLIN HFA) 108 (90 BASE) MCG/ACT inhaler Inhale 2 puffs into the lungs 4 (four) times daily as needed for wheezing or shortness of breath.    Marland Kitchen albuterol (PROVENTIL) (5 MG/ML) 0.5% nebulizer solution Take 0.5 mLs (2.5 mg total) by nebulization every 2 (two) hours as needed for wheezing or shortness of breath. 20 mL 0  . aspirin 81 MG tablet Take 81 mg by mouth daily.     Marland Kitchen atorvastatin (LIPITOR) 10 MG tablet Take 10 mg by mouth every evening.    . bisoprolol (ZEBETA) 10 MG tablet Take 0.5 tablets (5 mg total) by mouth daily. 30 tablet 6  . enalapril (VASOTEC) 20 MG tablet Take 0.5 tablets (10 mg total) by mouth daily. 30 tablet 9  . gemfibrozil (LOPID) 600 MG tablet Take 600 mg by mouth 2 (two) times daily before a meal.    . isosorbide mononitrate (ISMO,MONOKET) 20 MG tablet Take 1 tablet (20 mg total) by mouth 2 (two) times daily. 60 tablet 5  . multivitamin (THERAGRAN) per tablet Take 1 tablet by mouth daily.     . vitamin B-12 (CYANOCOBALAMIN) 1000 MCG tablet Take 1,000 mcg by mouth daily.      No current facility-administered medications on file prior to visit.    No Known Allergies  Past Medical History  Diagnosis Date  . Diabetes mellitus     type 2  . GERD (gastroesophageal reflux disease)     with esophagitis  . PVD (peripheral vascular disease)     bilateral  common iliac artery aneurysms. Right SFA occlusion over a long segment. Left SFA disease with occlusion of the left TP trunk. Artirogram Oct. 2006  . History of colonic polyps   . Diverticulosis of colon   . Sleep apnea   . Non-ischemic cardiomyopathy   . Coronary artery disease      LAD a 60-70% stenosis, moderate size second diagonal with 50% stenosis, circumflex AV groove 75-80% stenosis with a branching obtuse marginal with a superior branch subtotal stenosis followed by diffuse disease, right coronary artery  had nonobstructive disease.  EF was 35%  . COPD (chronic obstructive pulmonary disease)   . Tobacco abuse   . Urinary incontinence     detrussor instability  . Automatic implantable cardioverter-defibrillator in situ   . Shortness of breath     exertion  . Arthritis   . Hypertension     Past Surgical History  Procedure Laterality Date  . S/p icd placement       Medtronic Maximo [phone] single chamber  . Colonoscopy  04/2005  . Cardiac catheterization  06/2004    negative  . Pacemaker insertion  11/2005  . Copd exacerbation    . Pacemaker lead removal N/A 10/17/2014    Procedure: PACEMAKER LEAD REMOVAL;  Surgeon: Evans Lance, MD;  Location: [place];  Service: Cardiovascular;  Laterality: N/A;    Family History  Problem Relation Age of Onset  . Stroke Father   . Coronary artery disease Maternal Aunt   . Heart failure Maternal Aunt   . Lung cancer Maternal Aunt   . Lung cancer Maternal Uncle   . Cancer Brother     Mouth  . Cancer Sister     throat    History   Social History  . Marital Status: Married    Spouse Name: N/A    Number of Children: N/A  . Years of Education: N/A   Occupational History  . Not on file.   Social History Main Topics  . Smoking status: Current Every Day Smoker -- 2.00 packs/day for 15 years    Types: Cigarettes  . Smokeless tobacco: Never Used     Comment: GAVE 1-800-QUIT-NOW  . Alcohol Use: No  . Drug Use: No  . Sexual  Activity: No   Other Topics Concern  . Not on file   Social History Narrative   No living will   Requests wife as health care POA   Would accept resuscitation but doesn't want prolonged ventilation   No tube feeds if cognitively unaware   Review of Systems  Constitutional: Positive for fatigue. Negative for unexpected weight change.       Wears seat belt  Eyes: Negative for visual disturbance.       No diplopia or unilateral vision loss  Gastrointestinal: Negative for nausea, vomiting, abdominal pain, constipation and blood in stool.  Endocrine: Positive for polydipsia and polyuria.       Drinks and pees a lot--?behavioral  Genitourinary: Positive for frequency. Negative for urgency and difficulty urinating.       Some incontinence--wears pad at times  Musculoskeletal: Positive for back pain and arthralgias. Negative for joint swelling.       Does not use meds  Skin: Negative for rash.       No suspicious lesions  Allergic/Immunologic: Negative for environmental allergies and immunocompromised state.  Neurological: Positive for dizziness, weakness, numbness and headaches.  Hematological: Negative for adenopathy. Bruises/bleeds easily.  Psychiatric/Behavioral: Negative for sleep disturbance and dysphoric mood. The patient is not nervous/anxious.        Objective:   Physical Exam  Constitutional: He appears well-developed. No distress.  HENT:  Head: Normocephalic and atraumatic.  Right Ear: External ear normal.  Left Ear: External ear normal.  Mouth/Throat: Oropharynx is clear and moist. No oropharyngeal exudate.  Eyes: Conjunctivae and EOM are normal. Pupils are equal, round, and reactive to light.  Neck: Normal range of motion. Neck supple. No thyromegaly present.  Cardiovascular: Normal rate, regular rhythm and normal heart sounds.  Exam reveals no gallop.   No murmur heard. Feet warm but no palpable pulses  Pulmonary/Chest: Effort normal and breath sounds normal. No  respiratory distress. He has no wheezes. He has no rales.  Abdominal: Soft. There is no tenderness.  Musculoskeletal: He exhibits no edema or tenderness.  Lymphadenopathy:    He has no cervical adenopathy.  Skin:  No foot lesions  Psychiatric: He has a normal mood and affect. His behavior is normal.          Assessment & Plan:

## 2014-10-28 NOTE — Addendum Note (Signed)
Addended by: Despina Hidden on: 10/28/2014 01:00 PM   Modules accepted: Orders

## 2014-10-28 NOTE — Assessment & Plan Note (Signed)
No claudication.  ?

## 2014-10-28 NOTE — Patient Instructions (Signed)
Please set up your diabetic eye exam. 

## 2014-10-31 ENCOUNTER — Ambulatory Visit (INDEPENDENT_AMBULATORY_CARE_PROVIDER_SITE_OTHER): Payer: BC Managed Care – PPO | Admitting: *Deleted

## 2014-10-31 ENCOUNTER — Telehealth: Payer: Self-pay | Admitting: Internal Medicine

## 2014-10-31 DIAGNOSIS — Z4502 Encounter for adjustment and management of automatic implantable cardiac defibrillator: Secondary | ICD-10-CM

## 2014-10-31 NOTE — Telephone Encounter (Signed)
emmi emailed °

## 2014-10-31 NOTE — Progress Notes (Signed)
F/U s/p device extraction. Wound well healed without redness or edema. There were no steri strips or sutures to be removed. Patient education completed about wound care and signs of infection. Patient will follow up PRN. Continue F/U with [facility] as scheduled.

## 2014-11-03 NOTE — Progress Notes (Signed)
Late entry for missed G code May 11, 2014   2014-05-11 1600  PT G-Codes **NOT FOR INPATIENT CLASS**  Functional Assessment Tool Used clinical judgement  Functional Limitation Mobility: Walking and moving around  Mobility: Walking and Moving Around Current Status [phone]) [facility]  Mobility: Walking and Moving Around Goal Status [phone]) [facility]  Mobility: Walking and Moving Around Discharge Status [phone]) [place] Lannah Koike,PT Acute Rehabilitation [phone] (pager)

## 2014-11-16 ENCOUNTER — Other Ambulatory Visit: Payer: Self-pay | Admitting: Internal Medicine

## 2014-12-01 ENCOUNTER — Ambulatory Visit (HOSPITAL_COMMUNITY)
Admission: RE | Admit: 2014-12-01 | Discharge: 2014-12-01 | Disposition: A | Discharge status: Home / Self Care | Payer: BC Managed Care – PPO | Source: Ambulatory Visit | Attending: Cardiovascular Disease | Admitting: Cardiovascular Disease

## 2014-12-01 DIAGNOSIS — I6522 Occlusion and stenosis of left carotid artery: Secondary | ICD-10-CM | POA: Diagnosis not present

## 2014-12-01 DIAGNOSIS — I6529 Occlusion and stenosis of unspecified carotid artery: Secondary | ICD-10-CM

## 2014-12-01 DIAGNOSIS — I779 Disorder of arteries and arterioles, unspecified: Secondary | ICD-10-CM

## 2014-12-01 NOTE — Progress Notes (Signed)
Carotid Duplex Completed. Stable left ICA 50-69% stenosis.  Lorain Keast, BS, RDMS, RVT  

## 2014-12-05 ENCOUNTER — Encounter: Payer: Self-pay | Admitting: Cardiology

## 2014-12-05 ENCOUNTER — Ambulatory Visit (INDEPENDENT_AMBULATORY_CARE_PROVIDER_SITE_OTHER): Payer: BC Managed Care – PPO | Admitting: Cardiology

## 2014-12-05 VITALS — BP 132/70 | HR 56 | Ht 70.0 in | Wt 248.0 lb

## 2014-12-05 DIAGNOSIS — E1159 Type 2 diabetes mellitus with other circulatory complications: Secondary | ICD-10-CM

## 2014-12-05 DIAGNOSIS — I2510 Atherosclerotic heart disease of native coronary artery without angina pectoris: Secondary | ICD-10-CM

## 2014-12-05 DIAGNOSIS — E1151 Type 2 diabetes mellitus with diabetic peripheral angiopathy without gangrene: Secondary | ICD-10-CM

## 2014-12-05 NOTE — Progress Notes (Signed)
HPI The patient presents for follow up of CAD.   In 2012 he had a stress test indicating anterior ischemia mild but from base to apex.  I sent him for a cath demonstrating LAD a 60-70% stenosis, moderate size second diagonal with 50% stenosis, circumflex AV groove 75-80% stenosis with a branching obtuse marginal with a superior branch subtotal stenosis followed by diffuse disease, right coronary artery had nonobstructive disease.  EF was 35%.   He was managed medically.  He has had an ICD.  However, when he had ERI recently Dr. Lovena Le elected not to replace the device. He had a recall lead and it was removed.  He was hospitalized in May with syncope and orthostasis.  He had carotid stenosis as described below.  I reviewed the hospital records.  Since being seen he has had no new complaints.  He is fatigued.  However, the patient denies any new symptoms such as chest discomfort, neck or arm discomfort. There has been no new shortness of breath, PND or orthopnea. There have been no reported palpitations, presyncope or syncope.  His biggest complaint is fatigue.   No Known Allergies  Current Outpatient Prescriptions  Medication Sig Dispense Refill  . albuterol (PROVENTIL HFA;VENTOLIN HFA) 108 (90 BASE) MCG/ACT inhaler Inhale 2 puffs into the lungs 4 (four) times daily as needed for wheezing or shortness of breath.    Marland Kitchen albuterol (PROVENTIL) (5 MG/ML) 0.5% nebulizer solution Take 0.5 mLs (2.5 mg total) by nebulization every 2 (two) hours as needed for wheezing or shortness of breath. 20 mL 0  . aspirin 81 MG tablet Take 81 mg by mouth daily.     Marland Kitchen atorvastatin (LIPITOR) 10 MG tablet Take 10 mg by mouth every evening.    . bisoprolol (ZEBETA) 10 MG tablet Take 0.5 tablets (5 mg total) by mouth daily. 30 tablet 6  . enalapril (VASOTEC) 20 MG tablet Take 0.5 tablets (10 mg total) by mouth daily. 30 tablet 9  . gemfibrozil (LOPID) 600 MG tablet TAKE 1 TABLET BY MOUTH TWICE A DAY BEFORE A MEAL 60 tablet 11    . isosorbide mononitrate (ISMO,MONOKET) 20 MG tablet Take 1 tablet (20 mg total) by mouth 2 (two) times daily. 60 tablet 5  . multivitamin (THERAGRAN) per tablet Take 1 tablet by mouth daily.     . vitamin B-12 (CYANOCOBALAMIN) 1000 MCG tablet Take 1,000 mcg by mouth daily.      No current facility-administered medications for this visit.    Past Medical History  Diagnosis Date  . Diabetes mellitus     type 2  . GERD (gastroesophageal reflux disease)     with esophagitis  . PVD (peripheral vascular disease)     bilateral common iliac artery aneurysms. Right SFA occlusion over a long segment. Left SFA disease with occlusion of the left TP trunk. Artirogram Oct. 2006  . History of colonic polyps   . Diverticulosis of colon   . Sleep apnea   . Non-ischemic cardiomyopathy   . Coronary artery disease      LAD a 60-70% stenosis, moderate size second diagonal with 50% stenosis, circumflex AV groove 75-80% stenosis with a branching obtuse marginal with a superior branch subtotal stenosis followed by diffuse disease, right coronary artery  had nonobstructive disease.  EF was 35%  . COPD (chronic obstructive pulmonary disease)   . Tobacco abuse   . Urinary incontinence     detrussor instability  . Automatic implantable cardioverter-defibrillator in situ   .  Shortness of breath     exertion  . Arthritis   . Hypertension     Past Surgical History  Procedure Laterality Date  . S/p icd placement       Medtronic Maximo [phone] single chamber  . Colonoscopy  04/2005  . Cardiac catheterization  06/2004    negative  . Pacemaker insertion  11/2005  . Copd exacerbation    . Pacemaker lead removal N/A 10/17/2014    Procedure: PACEMAKER LEAD REMOVAL;  Surgeon: Evans Lance, MD;  Location: [facility];  Service: Cardiovascular;  Laterality: N/A;    ROS:  Leg cramps.  Otherwise as stated in the HPI and negative for all other systems.  PHYSICAL EXAM BP 132/70 mmHg  Pulse 56  Ht 5\' 10"  (1.778 m)   Wt 248 lb (112.492 kg)  BMI 35.58 kg/m2 GENERAL:  Well appearing NECK:  No jugular venous distention, waveform within normal limits, carotid upstroke brisk and symmetric, no bruits, no thyromegaly LUNGS:  Clear to auscultation bilaterally BACK:  No CVA tenderness CHEST: ICD pocket HEART:  PMI not displaced or sustained,S1 and S2 within normal limits, no S3, no S4, no clicks, no rubs, no murmurs ABD:  Flat, positive bowel sounds normal in frequency in pitch, no bruits, no rebound, no guarding, no midline pulsatile mass, no hepatomegaly, no splenomegaly, obese EXT:  2 plus pulses upper, absent DP/PT, no edema, no cyanosis no clubbing, right wrist OK   EKG:  Sinus rhythm, rate 56, axis within normal limits, intervals within normal limits, no acute ST-T wave changes.   12/05/2014    ASSESSMENT AND PLAN  CAD:   The patient has no new sypmtoms.  No further cardiovascular testing is indicated.  We will continue with aggressive risk reduction and meds as listed.    CAROTID STENOSIS:  He had 60 - 79% on the left.  This was repeated a few days ago and results are pending.  We will likely follow up in six months.   TOBACCO ABUSE:  He will not quit smoking and we talked about this today (again).  CARDIOMYOPATHY:  He seems to be euvolemic and the EF was unchanged recently.  At this point, no change in therapy is indicated.    DYSLIPIDEMIA:  I did review his lipid profile as below. His triglycerides were elevated but he was otherwise at target. He will continue on the meds as listed.  Lab Results  Component Value Date   CHOL 221* 10/28/2014   TRIG 353.0* 10/28/2014   HDL 39.10 10/28/2014   LDLDIRECT 92.8 10/28/2014

## 2014-12-05 NOTE — Patient Instructions (Signed)
Your physician recommends that you schedule a follow-up appointment in: one year with Dr. Hochrein  

## 2015-01-24 ENCOUNTER — Other Ambulatory Visit: Payer: Self-pay | Admitting: Cardiology

## 2015-03-02 ENCOUNTER — Ambulatory Visit (INDEPENDENT_AMBULATORY_CARE_PROVIDER_SITE_OTHER): Payer: BLUE CROSS/BLUE SHIELD | Admitting: Family Medicine

## 2015-03-02 ENCOUNTER — Ambulatory Visit (INDEPENDENT_AMBULATORY_CARE_PROVIDER_SITE_OTHER)
Admission: RE | Admit: 2015-03-02 | Discharge: 2015-03-02 | Disposition: A | Discharge status: Home / Self Care | Payer: BLUE CROSS/BLUE SHIELD | Source: Ambulatory Visit | Attending: Family Medicine | Admitting: Family Medicine

## 2015-03-02 ENCOUNTER — Encounter: Payer: Self-pay | Admitting: Family Medicine

## 2015-03-02 VITALS — BP 134/72 | HR 76 | Temp 98.1°F | Wt 256.2 lb

## 2015-03-02 DIAGNOSIS — R918 Other nonspecific abnormal finding of lung field: Secondary | ICD-10-CM

## 2015-03-02 DIAGNOSIS — E1159 Type 2 diabetes mellitus with other circulatory complications: Secondary | ICD-10-CM | POA: Diagnosis not present

## 2015-03-02 DIAGNOSIS — R55 Syncope and collapse: Secondary | ICD-10-CM

## 2015-03-02 DIAGNOSIS — Z720 Tobacco use: Secondary | ICD-10-CM

## 2015-03-02 DIAGNOSIS — E1151 Type 2 diabetes mellitus with diabetic peripheral angiopathy without gangrene: Secondary | ICD-10-CM

## 2015-03-02 DIAGNOSIS — I5022 Chronic systolic (congestive) heart failure: Secondary | ICD-10-CM | POA: Diagnosis not present

## 2015-03-02 NOTE — Assessment & Plan Note (Addendum)
Sounds like prediabetes. Pt does not check sugars at home.

## 2015-03-02 NOTE — Assessment & Plan Note (Signed)
Anticipate overheating related syncope. Discussed avoiding greenhouse during hot portions of the day and importance of good hydration status throughout the day. Check CXR given RLL coarse rhonchi. Check TSH, BMP, CBC today. To update [place] if persistent syncopal episodes despite above changes. Pt agrees with plan.  EKG - NSR rate 70s, mildly prolonged PR, normal axis, no acute ST/T changes

## 2015-03-02 NOTE — Progress Notes (Signed)
Pre visit review using our clinic review tool, if applicable. No additional management support is needed unless otherwise documented below in the visit note. 

## 2015-03-02 NOTE — Progress Notes (Signed)
BP 134/72 mmHg  Pulse 76  Temp(Src) 98.1 F (36.7 C) (Oral)  Wt 256 lb 4 oz (116.234 kg)   CC: fall, fatigue  Subjective:    Patient ID: [name], male    DOB: 1956-11-13, 59 y.o.   MRN: [id number]  HPI: [name] is a 59 y.o. male presenting on 03/02/2015 for Fatigue and Fall   Pt of Dr Alla German presents today for acute visit for syncope. Had 1 episode yesterday where he blacked out - felt dizzy prior to passing out. Was standing fixing busted water in greenhouse (about 85 degrees). Felt dizzy but continued working (normally goes inside or sits down when he feels this way). Unsure how long he was out. Got up and while walking in to the house had a second fall but no LOC this time and was able to break his fall with hands. Also noticing worsening fatigue for last several weeks.   Intermittent headaches, coughing (not more than baseline), baseline dyspnea. New twitch of neck over last 2 years.  Denies chest pain/tightness or palpitations, new fevers.   Did eat normally that day. Thinks was staying well hydrated. Doesn't have a way of checking sugars.   H/o chronic systolic CHF 6387, dizzy since then. Also with prediabetes, HTN, COPD still smoker (2 ppd), CAD, significant PVD.  He had episode of syncope 1 yr ago, told due to dehydration/orthostasis. Hospitalized for 3-4 days. Workup included nl head CT, nl cervical spine CT. 2Decho with mod dilated Lventricle with EF 56-43%, grade 1 diastolic dysfunction, mildly dilated LA.  Known carotid stenosis, gets US done Q6 mo. H/o pacemaker, removed late 2015.   Lab Results  Component Value Date   HGBA1C 5.9 10/28/2014    Relevant past medical, surgical, family and social history reviewed and updated as indicated. Interim medical history since our last visit reviewed. Allergies and medications reviewed and updated. Current Outpatient Prescriptions on File Prior to Visit  Medication Sig  . albuterol (PROVENTIL HFA;VENTOLIN HFA)  108 (90 BASE) MCG/ACT inhaler Inhale 2 puffs into the lungs 4 (four) times daily as needed for wheezing or shortness of breath.  Marland Kitchen albuterol (PROVENTIL) (5 MG/ML) 0.5% nebulizer solution Take 0.5 mLs (2.5 mg total) by nebulization every 2 (two) hours as needed for wheezing or shortness of breath.  Marland Kitchen aspirin 81 MG tablet Take 81 mg by mouth daily.   Marland Kitchen atorvastatin (LIPITOR) 10 MG tablet Take 10 mg by mouth every evening.  . bisoprolol (ZEBETA) 10 MG tablet Take 0.5 tablets (5 mg total) by mouth daily.  . enalapril (VASOTEC) 20 MG tablet Take 0.5 tablets (10 mg total) by mouth daily.  Marland Kitchen gemfibrozil (LOPID) 600 MG tablet TAKE 1 TABLET BY MOUTH TWICE A DAY BEFORE A MEAL  . isosorbide mononitrate (ISMO,MONOKET) 20 MG tablet TAKE 1 TABLET BY MOUTH TWICE A DAY  . multivitamin (THERAGRAN) per tablet Take 1 tablet by mouth daily.   . vitamin B-12 (CYANOCOBALAMIN) 1000 MCG tablet Take 1,000 mcg by mouth daily.    No current facility-administered medications on file prior to visit.   Past Medical History  Diagnosis Date  . Diabetes mellitus     type 2  . GERD (gastroesophageal reflux disease)     with esophagitis  . PVD (peripheral vascular disease)     bilateral common iliac artery aneurysms. Right SFA occlusion over a long segment. Left SFA disease with occlusion of the left TP trunk. Artirogram Oct. 2006  . History of colonic polyps   .  Diverticulosis of colon   . Sleep apnea   . Non-ischemic cardiomyopathy   . Coronary artery disease      LAD a 60-70% stenosis, moderate size second diagonal with 50% stenosis, circumflex AV groove 75-80% stenosis with a branching obtuse marginal with a superior branch subtotal stenosis followed by diffuse disease, right coronary artery  had nonobstructive disease.  EF was 35%  . COPD (chronic obstructive pulmonary disease)   . Tobacco abuse   . Urinary incontinence     detrussor instability  . Automatic implantable cardioverter-defibrillator in situ   .  Shortness of breath     exertion  . Arthritis   . Hypertension     Past Surgical History  Procedure Laterality Date  . S/p icd placement       Medtronic Maximo [phone] single chamber  . Colonoscopy  04/2005  . Cardiac catheterization  06/2004    negative  . Pacemaker insertion  11/2005  . Copd exacerbation    . Pacemaker lead removal N/A 10/17/2014    Procedure: PACEMAKER LEAD REMOVAL;  Surgeon: Evans Lance, MD;  Location: [facility] OR;  Service: Cardiovascular;  Laterality: N/A;   Review of Systems Per HPI unless specifically indicated above     Objective:    BP 134/72 mmHg  Pulse 76  Temp(Src) 98.1 F (36.7 C) (Oral)  Wt 256 lb 4 oz (116.234 kg)  Wt Readings from Last 3 Encounters:  03/02/15 256 lb 4 oz (116.234 kg)  12/05/14 248 lb (112.492 kg)  10/28/14 249 lb (112.946 kg)    Physical Exam  Constitutional: He is oriented to person, place, and time. He appears well-developed and well-nourished. No distress.  HENT:  Head: Normocephalic and atraumatic.  Mouth/Throat: Oropharynx is clear and moist. No oropharyngeal exudate.  Eyes: Conjunctivae and EOM are normal. Pupils are equal, round, and reactive to light. No scleral icterus.  Neck: Normal range of motion. Neck supple. No thyromegaly present.  Cardiovascular: Normal rate, regular rhythm, normal heart sounds and intact distal pulses.   No murmur heard. Pulmonary/Chest: No respiratory distress. He has no wheezes. He has rhonchi in the right lower field. He has no rales.  Lymphadenopathy:    He has no cervical adenopathy.  Neurological: He is alert and oriented to person, place, and time. He has normal strength. No cranial nerve deficit or sensory deficit. He displays a negative Romberg sign. Coordination normal.  CN 2-12 intact FTN intact  Skin: Skin is warm and dry. No rash noted.  Psychiatric: He has a normal mood and affect.  Vitals reviewed.  Results for orders placed or performed in visit on 10/28/14  Hemoglobin  A1c  Result Value Ref Range   Hgb A1c MFr Bld 5.9 4.6 - 6.5 %  Lipid panel  Result Value Ref Range   Cholesterol 221 (H) 0 - 200 mg/dL   Triglycerides 353.0 (H) 0.0 - 149.0 mg/dL   HDL 39.10 >39.00 mg/dL   VLDL 70.6 (H) 0.0 - 40.0 mg/dL   Total CHOL/HDL Ratio 6    NonHDL 181.90   LDL cholesterol, direct  Result Value Ref Range   Direct LDL 92.8 mg/dL  HM DIABETES FOOT EXAM  Result Value Ref Range   HM Diabetic Foot Exam done       Assessment & Plan:   Problem List Items Addressed This Visit    Type 2 diabetes, controlled, with peripheral circulatory disorder    Sounds like prediabetes. Pt does not check sugars at home.  Tobacco use    Continued to encourage cessation      Syncope and collapse - Primary    Anticipate overheating related syncope. Discussed avoiding greenhouse during hot portions of the day and importance of good hydration status throughout the day. Check CXR given RLL coarse rhonchi. Check TSH, BMP, CBC today. To update [place] if persistent syncopal episodes despite above changes. Pt agrees with plan.  EKG - NSR rate 70s, mildly prolonged PR, normal axis, no acute ST/T changes      Relevant Orders   EKG 12-Lead (Completed)   TSH   CBC with Differential/Platelet   Basic metabolic panel   DG Chest 2 View   Chronic systolic heart failure   Relevant Orders   TSH    Other Visit Diagnoses    Lung field abnormal finding on examination        Relevant Orders    DG Chest 2 View        Follow up plan: Return if symptoms worsen or fail to improve.

## 2015-03-02 NOTE — Patient Instructions (Signed)
I think this passing out was from overheating. Avoid greenhouse during hot portions of day. Make sure you are staying well hydrated. EKG looking ok today. Xray and labs today. Please call us right away or seek urgent care if persistent passing out despite above changes.

## 2015-03-02 NOTE — Assessment & Plan Note (Signed)
Continued to encourage cessation. 

## 2015-03-03 ENCOUNTER — Encounter: Payer: Self-pay | Admitting: Family Medicine

## 2015-03-03 LAB — CBC WITH DIFFERENTIAL/PLATELET
BASOS PCT: 1.1 % (ref 0.0–3.0)
Basophils Absolute: 0.1 10*3/uL (ref 0.0–0.1)
Eosinophils Absolute: 0.2 10*3/uL (ref 0.0–0.7)
Eosinophils Relative: 2.3 % (ref 0.0–5.0)
HEMATOCRIT: 46.2 % (ref 39.0–52.0)
Hemoglobin: 15.9 g/dL (ref 13.0–17.0)
LYMPHS ABS: 3.7 10*3/uL (ref 0.7–4.0)
Lymphocytes Relative: 40.5 % (ref 12.0–46.0)
MCHC: 34.4 g/dL (ref 30.0–36.0)
MCV: 84 fl (ref 78.0–100.0)
MONOS PCT: 7.2 % (ref 3.0–12.0)
Monocytes Absolute: 0.7 10*3/uL (ref 0.1–1.0)
Neutro Abs: 4.5 10*3/uL (ref 1.4–7.7)
Neutrophils Relative %: 48.9 % (ref 43.0–77.0)
Platelets: 284 10*3/uL (ref 150.0–400.0)
RBC: 5.5 Mil/uL (ref 4.22–5.81)
RDW: 14 % (ref 11.5–15.5)
WBC: 9.1 10*3/uL (ref 4.0–10.5)

## 2015-03-03 LAB — BASIC METABOLIC PANEL
BUN: 9 mg/dL (ref 6–23)
CHLORIDE: 104 meq/L (ref 96–112)
CO2: 28 meq/L (ref 19–32)
Calcium: 9.5 mg/dL (ref 8.4–10.5)
Creatinine, Ser: 0.78 mg/dL (ref 0.40–1.50)
GFR: 108.55 mL/min (ref >60.00)
Glucose, Bld: 86 mg/dL (ref 70–99)
POTASSIUM: 4.6 meq/L (ref 3.5–5.1)
Sodium: 137 mEq/L (ref 135–145)

## 2015-03-03 LAB — TSH: TSH: 1.03 u[IU]/mL (ref 0.35–4.50)

## 2015-04-28 ENCOUNTER — Ambulatory Visit: Payer: Medicare Other | Admitting: Internal Medicine

## 2015-04-28 DIAGNOSIS — Z0289 Encounter for other administrative examinations: Secondary | ICD-10-CM

## 2015-05-10 ENCOUNTER — Ambulatory Visit (INDEPENDENT_AMBULATORY_CARE_PROVIDER_SITE_OTHER): Payer: BLUE CROSS/BLUE SHIELD | Admitting: Primary Care

## 2015-05-10 ENCOUNTER — Ambulatory Visit (HOSPITAL_COMMUNITY): Payer: BLUE CROSS/BLUE SHIELD | Attending: Primary Care

## 2015-05-10 ENCOUNTER — Encounter: Payer: Self-pay | Admitting: Primary Care

## 2015-05-10 VITALS — BP 152/82 | HR 73 | Temp 97.8°F | Ht 70.0 in | Wt 259.8 lb

## 2015-05-10 DIAGNOSIS — M7989 Other specified soft tissue disorders: Secondary | ICD-10-CM

## 2015-05-10 DIAGNOSIS — I10 Essential (primary) hypertension: Secondary | ICD-10-CM | POA: Diagnosis not present

## 2015-05-10 DIAGNOSIS — M79662 Pain in left lower leg: Secondary | ICD-10-CM | POA: Diagnosis not present

## 2015-05-10 DIAGNOSIS — J449 Chronic obstructive pulmonary disease, unspecified: Secondary | ICD-10-CM | POA: Diagnosis not present

## 2015-05-10 DIAGNOSIS — I739 Peripheral vascular disease, unspecified: Secondary | ICD-10-CM | POA: Insufficient documentation

## 2015-05-10 DIAGNOSIS — I2510 Atherosclerotic heart disease of native coronary artery without angina pectoris: Secondary | ICD-10-CM | POA: Insufficient documentation

## 2015-05-10 DIAGNOSIS — F17200 Nicotine dependence, unspecified, uncomplicated: Secondary | ICD-10-CM | POA: Diagnosis not present

## 2015-05-10 DIAGNOSIS — E785 Hyperlipidemia, unspecified: Secondary | ICD-10-CM | POA: Diagnosis not present

## 2015-05-10 NOTE — Progress Notes (Signed)
Subjective:    Patient ID: [name], male    DOB: Jul 19, 1956, 59 y.o.   MRN: [id number]  HPI  [name] is a 59 year old male who presents today with a chief complaint of lower extremity swelling. The swelling has been present for the last 2 days and is present to the left side. He reports pain and tightness specifically to his calf, ankle, and foot. He's not been elevating his legs, and sleeps with his in a downward position. He's not taken anything for his swelling or pain. The swelling has not worsened, but has not decreased.   Review of Systems  Constitutional: Negative for fever and chills.  HENT: Negative for rhinorrhea.   Respiratory: Negative for shortness of breath.   Cardiovascular: Positive for leg swelling. Negative for chest pain.  Skin: Negative for color change and rash.  Neurological: Negative for dizziness.       Past Medical History  Diagnosis Date  . Diabetes mellitus     type 2  . GERD (gastroesophageal reflux disease)     with esophagitis  . PVD (peripheral vascular disease)     bilateral common iliac artery aneurysms. Right SFA occlusion over a long segment. Left SFA disease with occlusion of the left TP trunk. Artirogram Oct. 2006  . History of colonic polyps   . Diverticulosis of colon   . Sleep apnea   . Non-ischemic cardiomyopathy   . Coronary artery disease      LAD a 60-70% stenosis, moderate size second diagonal with 50% stenosis, circumflex AV groove 75-80% stenosis with a branching obtuse marginal with a superior branch subtotal stenosis followed by diffuse disease, right coronary artery  had nonobstructive disease.  EF was 35%  . COPD (chronic obstructive pulmonary disease)     emphysema by CXR  . Tobacco abuse   . Urinary incontinence     detrussor instability  . Automatic implantable cardioverter-defibrillator in situ   . Shortness of breath     exertion  . Arthritis   . Hypertension     History   Social History  . Marital  Status: Married    Spouse Name: N/A  . Number of Children: N/A  . Years of Education: N/A   Occupational History  . Not on file.   Social History Main Topics  . Smoking status: Current Every Day Smoker -- 2.00 packs/day for 15 years    Types: Cigarettes  . Smokeless tobacco: Never Used     Comment: GAVE 1-800-QUIT-NOW  . Alcohol Use: No  . Drug Use: No  . Sexual Activity: No   Other Topics Concern  . Not on file   Social History Narrative   No living will   Requests wife as health care POA   Would accept resuscitation but doesn't want prolonged ventilation   No tube feeds if cognitively unaware    Past Surgical History  Procedure Laterality Date  . S/p icd placement       Medtronic Maximo [phone] single chamber  . Colonoscopy  04/2005  . Cardiac catheterization  06/2004    negative  . Pacemaker insertion  11/2005  . Copd exacerbation    . Pacemaker lead removal N/A 10/17/2014    Procedure: PACEMAKER LEAD REMOVAL;  Surgeon: Evans Lance, MD;  Location: [facility] OR;  Service: Cardiovascular;  Laterality: N/A;    Family History  Problem Relation Age of Onset  . Stroke Father   . Coronary artery disease Maternal Aunt   .  Heart failure Maternal Aunt   . Lung cancer Maternal Aunt   . Lung cancer Maternal Uncle   . Cancer Brother     Mouth  . Cancer Sister     throat    No Known Allergies  Current Outpatient Prescriptions on File Prior to Visit  Medication Sig Dispense Refill  . albuterol (PROVENTIL HFA;VENTOLIN HFA) 108 (90 BASE) MCG/ACT inhaler Inhale 2 puffs into the lungs 4 (four) times daily as needed for wheezing or shortness of breath.    Marland Kitchen albuterol (PROVENTIL) (5 MG/ML) 0.5% nebulizer solution Take 0.5 mLs (2.5 mg total) by nebulization every 2 (two) hours as needed for wheezing or shortness of breath. 20 mL 0  . aspirin 81 MG tablet Take 81 mg by mouth daily.     Marland Kitchen atorvastatin (LIPITOR) 10 MG tablet Take 10 mg by mouth every evening.    . bisoprolol (ZEBETA)  10 MG tablet Take 0.5 tablets (5 mg total) by mouth daily. 30 tablet 6  . enalapril (VASOTEC) 20 MG tablet Take 0.5 tablets (10 mg total) by mouth daily. 30 tablet 9  . gemfibrozil (LOPID) 600 MG tablet TAKE 1 TABLET BY MOUTH TWICE A DAY BEFORE A MEAL 60 tablet 11  . isosorbide mononitrate (ISMO,MONOKET) 20 MG tablet TAKE 1 TABLET BY MOUTH TWICE A DAY 60 tablet 10  . multivitamin (THERAGRAN) per tablet Take 1 tablet by mouth daily.     . vitamin B-12 (CYANOCOBALAMIN) 1000 MCG tablet Take 1,000 mcg by mouth daily.      No current facility-administered medications on file prior to visit.    BP 152/82 mmHg  Pulse 73  Temp(Src) 97.8 F (36.6 C) (Oral)  Ht 5\' 10"  (1.778 m)  Wt 259 lb 12.8 oz (117.845 kg)  BMI 37.28 kg/m2  SpO2 95%    Objective:   Physical Exam  Constitutional: He is oriented to person, place, and time. No distress.  Cardiovascular: Normal rate and regular rhythm.   Pulses:      Radial pulses are 2+ on the right side, and 2+ on the left side.       Dorsalis pedis pulses are 2+ on the right side, and 2+ on the left side.       Posterior tibial pulses are 2+ on the right side, and 2+ on the left side.  Mild edema present to left lower extremity with trace pitting. Positive homans sign to left extremity. Tender to calf, ankle and foot.  Pulmonary/Chest: Effort normal. He has rales.  Neurological: He is alert and oriented to person, place, and time.  Skin: Skin is warm and dry. No erythema.          Assessment & Plan:  Lower extremity edema:  Left side. Mild edema with trace pitting. Positive Homans sign Venous ultrasound to rule out DVT. Elevate feet when not ambulating. Compression stockings while active. Follow up if development of redness, increased swelling and/or pain.

## 2015-05-10 NOTE — Patient Instructions (Signed)
Stop by the front and schedule your ultrasound before leaving today. I will call you when I get the results. Elevate your legs when sitting down. Use compression stockings when walking for a prolonged mount of time. Call me if your leg swelling increases, starts to develop redness, pain worsens.  Peripheral Edema You have swelling in your legs (peripheral edema). This swelling is due to excess accumulation of salt and water in your body. Edema may be a sign of heart, kidney or liver disease, or a side effect of a medication. It may also be due to problems in the leg veins. Elevating your legs and using special support stockings may be very helpful, if the cause of the swelling is due to poor venous circulation. Avoid long periods of standing, whatever the cause. Treatment of edema depends on identifying the cause. Chips, pretzels, pickles and other salty foods should be avoided. Restricting salt in your diet is almost always needed. Water pills (diuretics) are often used to remove the excess salt and water from your body via urine. These medicines prevent the kidney from reabsorbing sodium. This increases urine flow. Diuretic treatment may also result in lowering of potassium levels in your body. Potassium supplements may be needed if you have to use diuretics daily. Daily weights can help you keep track of your progress in clearing your edema. You should call your caregiver for follow up care as recommended. SEEK IMMEDIATE MEDICAL CARE IF:   You have increased swelling, pain, redness, or heat in your legs.  You develop shortness of breath, especially when lying down.  You develop chest or abdominal pain, weakness, or fainting.  You have a fever. Document Released: 01/16/2005 Document Revised: 03/02/2012 Document Reviewed: 12/27/2009 [facility] Patient Information 2015 [place], [place]. This information is not intended to replace advice given to you by your health care provider. Make sure you discuss  any questions you have with your health care provider.

## 2015-05-15 ENCOUNTER — Ambulatory Visit (INDEPENDENT_AMBULATORY_CARE_PROVIDER_SITE_OTHER): Payer: BLUE CROSS/BLUE SHIELD | Admitting: Internal Medicine

## 2015-05-15 ENCOUNTER — Encounter: Payer: Self-pay | Admitting: Internal Medicine

## 2015-05-15 VITALS — BP 148/80 | HR 78 | Temp 97.7°F | Wt 256.0 lb

## 2015-05-15 DIAGNOSIS — M7989 Other specified soft tissue disorders: Secondary | ICD-10-CM | POA: Diagnosis not present

## 2015-05-15 NOTE — Progress Notes (Signed)
Pre visit review using our clinic review tool, if applicable. No additional management support is needed unless otherwise documented below in the visit note. 

## 2015-05-15 NOTE — Assessment & Plan Note (Signed)
Much better now I suspect he may have gotten bite and now the reaction is improving Not consistent with CHF or venous insufficiency Observation only for now

## 2015-05-15 NOTE — Progress Notes (Signed)
Subjective:    Patient ID: [name], male    DOB: Jan 20, 1956, 59 y.o.   MRN: [id number]  HPI Here due to continued leg swelling  Swelling is some better Keeping it up some Still with pain in the ankle  No injury Had been in garden--then noticed swelling in foot Then it went up leg There is a sore on leg--but doesn't remember getting bitten  Current Outpatient Prescriptions on File Prior to Visit  Medication Sig Dispense Refill  . albuterol (PROVENTIL HFA;VENTOLIN HFA) 108 (90 BASE) MCG/ACT inhaler Inhale 2 puffs into the lungs 4 (four) times daily as needed for wheezing or shortness of breath.    Marland Kitchen albuterol (PROVENTIL) (5 MG/ML) 0.5% nebulizer solution Take 0.5 mLs (2.5 mg total) by nebulization every 2 (two) hours as needed for wheezing or shortness of breath. 20 mL 0  . aspirin 81 MG tablet Take 81 mg by mouth daily.     Marland Kitchen atorvastatin (LIPITOR) 10 MG tablet Take 10 mg by mouth every evening.    . bisoprolol (ZEBETA) 10 MG tablet Take 0.5 tablets (5 mg total) by mouth daily. 30 tablet 6  . enalapril (VASOTEC) 20 MG tablet Take 0.5 tablets (10 mg total) by mouth daily. 30 tablet 9  . gemfibrozil (LOPID) 600 MG tablet TAKE 1 TABLET BY MOUTH TWICE A DAY BEFORE A MEAL 60 tablet 11  . isosorbide mononitrate (ISMO,MONOKET) 20 MG tablet TAKE 1 TABLET BY MOUTH TWICE A DAY 60 tablet 10  . multivitamin (THERAGRAN) per tablet Take 1 tablet by mouth daily.     . vitamin B-12 (CYANOCOBALAMIN) 1000 MCG tablet Take 1,000 mcg by mouth daily.      No current facility-administered medications on file prior to visit.    No Known Allergies  Past Medical History  Diagnosis Date  . Diabetes mellitus     type 2  . GERD (gastroesophageal reflux disease)     with esophagitis  . PVD (peripheral vascular disease)     bilateral common iliac artery aneurysms. Right SFA occlusion over a long segment. Left SFA disease with occlusion of the left TP trunk. Artirogram Oct. 2006  . History of  colonic polyps   . Diverticulosis of colon   . Sleep apnea   . Non-ischemic cardiomyopathy   . Coronary artery disease      LAD a 60-70% stenosis, moderate size second diagonal with 50% stenosis, circumflex AV groove 75-80% stenosis with a branching obtuse marginal with a superior branch subtotal stenosis followed by diffuse disease, right coronary artery  had nonobstructive disease.  EF was 35%  . COPD (chronic obstructive pulmonary disease)     emphysema by CXR  . Tobacco abuse   . Urinary incontinence     detrussor instability  . Automatic implantable cardioverter-defibrillator in situ   . Shortness of breath     exertion  . Arthritis   . Hypertension     Past Surgical History  Procedure Laterality Date  . S/p icd placement       Medtronic Maximo [phone] single chamber  . Colonoscopy  04/2005  . Cardiac catheterization  06/2004    negative  . Pacemaker insertion  11/2005  . Copd exacerbation    . Pacemaker lead removal N/A 10/17/2014    Procedure: PACEMAKER LEAD REMOVAL;  Surgeon: Evans Lance, MD;  Location: [facility] OR;  Service: Cardiovascular;  Laterality: N/A;    Family History  Problem Relation Age of Onset  . Stroke Father   .  Coronary artery disease Maternal Aunt   . Heart failure Maternal Aunt   . Lung cancer Maternal Aunt   . Lung cancer Maternal Uncle   . Cancer Brother     Mouth  . Cancer Sister     throat    History   Social History  . Marital Status: Married    Spouse Name: N/A  . Number of Children: N/A  . Years of Education: N/A   Occupational History  . Not on file.   Social History Main Topics  . Smoking status: Current Every Day Smoker -- 2.00 packs/day for 15 years    Types: Cigarettes  . Smokeless tobacco: Never Used     Comment: GAVE 1-800-QUIT-NOW  . Alcohol Use: No  . Drug Use: No  . Sexual Activity: No   Other Topics Concern  . Not on file   Social History Narrative   No living will   Requests wife as health care POA   Would  accept resuscitation but doesn't want prolonged ventilation   No tube feeds if cognitively unaware   Review of Systems  No fever No chest pain Breathing has been okay---gets stable DOE     Objective:   Physical Exam  Constitutional: He appears well-developed. No distress.  Cardiovascular: Normal rate, regular rhythm and normal heart sounds.  Exam reveals no gallop.   No murmur heard. Pulmonary/Chest: No respiratory distress. He has no rales.  Slight rhonchi and wheeze but not tight (did stop smoking this morning!!)  Musculoskeletal:  Minimal left foot edema now No clear bite Homan's negative          Assessment & Plan:

## 2015-06-09 ENCOUNTER — Other Ambulatory Visit: Payer: Self-pay | Admitting: Cardiology

## 2015-06-09 DIAGNOSIS — I6529 Occlusion and stenosis of unspecified carotid artery: Secondary | ICD-10-CM

## 2015-06-19 ENCOUNTER — Ambulatory Visit (HOSPITAL_COMMUNITY)
Admission: RE | Admit: 2015-06-19 | Discharge: 2015-06-19 | Disposition: A | Discharge status: Home / Self Care | Payer: BC Managed Care – PPO | Source: Ambulatory Visit | Attending: Cardiovascular Disease | Admitting: Cardiovascular Disease

## 2015-06-19 DIAGNOSIS — I6523 Occlusion and stenosis of bilateral carotid arteries: Secondary | ICD-10-CM | POA: Diagnosis not present

## 2015-06-19 DIAGNOSIS — I6529 Occlusion and stenosis of unspecified carotid artery: Secondary | ICD-10-CM | POA: Diagnosis not present

## 2015-06-19 NOTE — Progress Notes (Signed)
Preliminary results by tech - Carotid Duplex Completed.  Right ICA 1-49% stenosis and left ICA stable  50-69% stenosis.  Bilateral vertebral arteries, antegrade flow.  Oda Cogan, BS, RDMS, RVT

## 2015-06-22 ENCOUNTER — Telehealth: Payer: Self-pay | Admitting: *Deleted

## 2015-06-22 DIAGNOSIS — R0989 Other specified symptoms and signs involving the circulatory and respiratory systems: Secondary | ICD-10-CM

## 2015-06-22 NOTE — Telephone Encounter (Signed)
Ultrasound was ordered

## 2015-07-05 ENCOUNTER — Encounter: Payer: Self-pay | Admitting: Internal Medicine

## 2015-07-07 ENCOUNTER — Encounter: Payer: Self-pay | Admitting: Internal Medicine

## 2015-10-26 ENCOUNTER — Other Ambulatory Visit: Payer: Self-pay | Admitting: Internal Medicine

## 2015-10-27 MED ORDER — ALBUTEROL SULFATE HFA 108 (90 BASE) MCG/ACT IN AERS: 2.0000 | INHALATION_SPRAY | Freq: Four times a day (QID) | RESPIRATORY_TRACT | Status: DC | PRN

## 2015-10-27 NOTE — Telephone Encounter (Signed)
Approved:  #1 x 1 refill 

## 2015-11-29 ENCOUNTER — Ambulatory Visit (INDEPENDENT_AMBULATORY_CARE_PROVIDER_SITE_OTHER): Payer: BLUE CROSS/BLUE SHIELD | Admitting: Internal Medicine

## 2015-11-29 ENCOUNTER — Encounter: Payer: Self-pay | Admitting: Internal Medicine

## 2015-11-29 VITALS — BP 142/80 | HR 68 | Temp 97.7°F | Ht 70.0 in | Wt 259.0 lb

## 2015-11-29 DIAGNOSIS — I25119 Atherosclerotic heart disease of native coronary artery with unspecified angina pectoris: Secondary | ICD-10-CM | POA: Diagnosis not present

## 2015-11-29 DIAGNOSIS — E1142 Type 2 diabetes mellitus with diabetic polyneuropathy: Secondary | ICD-10-CM | POA: Diagnosis not present

## 2015-11-29 DIAGNOSIS — I5022 Chronic systolic (congestive) heart failure: Secondary | ICD-10-CM | POA: Diagnosis not present

## 2015-11-29 DIAGNOSIS — I6529 Occlusion and stenosis of unspecified carotid artery: Secondary | ICD-10-CM

## 2015-11-29 DIAGNOSIS — E785 Hyperlipidemia, unspecified: Secondary | ICD-10-CM | POA: Diagnosis not present

## 2015-11-29 DIAGNOSIS — Z23 Encounter for immunization: Secondary | ICD-10-CM | POA: Diagnosis not present

## 2015-11-29 DIAGNOSIS — E1151 Type 2 diabetes mellitus with diabetic peripheral angiopathy without gangrene: Secondary | ICD-10-CM

## 2015-11-29 DIAGNOSIS — Z0000 Encounter for general adult medical examination without abnormal findings: Secondary | ICD-10-CM | POA: Diagnosis not present

## 2015-11-29 DIAGNOSIS — F39 Unspecified mood [affective] disorder: Secondary | ICD-10-CM | POA: Insufficient documentation

## 2015-11-29 DIAGNOSIS — J439 Emphysema, unspecified: Secondary | ICD-10-CM

## 2015-11-29 LAB — COMPREHENSIVE METABOLIC PANEL
ALBUMIN: 4.2 g/dL (ref 3.5–5.2)
ALK PHOS: 94 U/L (ref 39–117)
ALT: 18 U/L (ref 0–53)
AST: 19 U/L (ref 0–37)
BILIRUBIN TOTAL: 0.5 mg/dL (ref 0.2–1.2)
BUN: 10 mg/dL (ref 6–23)
CALCIUM: 9.4 mg/dL (ref 8.4–10.5)
CO2: 30 mEq/L (ref 19–32)
Chloride: 102 mEq/L (ref 96–112)
Creatinine, Ser: 0.71 mg/dL (ref 0.40–1.50)
GFR: 120.68 mL/min (ref >60.00)
GLUCOSE: 88 mg/dL (ref 70–99)
POTASSIUM: 4.8 meq/L (ref 3.5–5.1)
Sodium: 138 mEq/L (ref 135–145)
TOTAL PROTEIN: 7.2 g/dL (ref 6.0–8.3)

## 2015-11-29 LAB — CBC WITH DIFFERENTIAL/PLATELET
BASOS ABS: 0.1 10*3/uL (ref 0.0–0.1)
Basophils Relative: 0.7 % (ref 0.0–3.0)
Eosinophils Absolute: 0.2 10*3/uL (ref 0.0–0.7)
Eosinophils Relative: 1.7 % (ref 0.0–5.0)
HCT: 47.1 % (ref 39.0–52.0)
Hemoglobin: 15.9 g/dL (ref 13.0–17.0)
LYMPHS ABS: 3.5 10*3/uL (ref 0.7–4.0)
LYMPHS PCT: 38 % (ref 12.0–46.0)
MCHC: 33.7 g/dL (ref 30.0–36.0)
MCV: 82.5 fl (ref 78.0–100.0)
MONOS PCT: 5.7 % (ref 3.0–12.0)
Monocytes Absolute: 0.5 10*3/uL (ref 0.1–1.0)
NEUTROS PCT: 53.9 % (ref 43.0–77.0)
Neutro Abs: 5 10*3/uL (ref 1.4–7.7)
PLATELETS: 307 10*3/uL (ref 150.0–400.0)
RBC: 5.71 Mil/uL (ref 4.22–5.81)
RDW: 13.6 % (ref 11.5–15.5)
WBC: 9.4 10*3/uL (ref 4.0–10.5)

## 2015-11-29 LAB — LIPID PANEL
Cholesterol: 218 mg/dL — ABNORMAL HIGH (ref 0–200)
HDL: 31.9 mg/dL — AB (ref >39.00)
NonHDL: 185.86
TRIGLYCERIDES: 296 mg/dL — AB (ref 0.0–149.0)
Total CHOL/HDL Ratio: 7
VLDL: 59.2 mg/dL — ABNORMAL HIGH (ref 0.0–40.0)

## 2015-11-29 LAB — HEMOGLOBIN A1C: Hgb A1c MFr Bld: 5.8 % (ref 4.6–6.5)

## 2015-11-29 LAB — LDL CHOLESTEROL, DIRECT: Direct LDL: 73 mg/dL

## 2015-11-29 LAB — HM DIABETES FOOT EXAM

## 2015-11-29 MED ORDER — BISOPROLOL FUMARATE 10 MG PO TABS: 5.0000 mg | ORAL_TABLET | Freq: Every day | ORAL | Status: DC

## 2015-11-29 MED ORDER — ENALAPRIL MALEATE 20 MG PO TABS: 10.0000 mg | ORAL_TABLET | Freq: Every day | ORAL | Status: DC

## 2015-11-29 MED ORDER — GEMFIBROZIL 600 MG PO TABS: 600.0000 mg | ORAL_TABLET | Freq: Two times a day (BID) | ORAL | Status: DC

## 2015-11-29 MED ORDER — ISOSORBIDE MONONITRATE 20 MG PO TABS: 20.0000 mg | ORAL_TABLET | Freq: Two times a day (BID) | ORAL | Status: DC

## 2015-11-29 MED ORDER — ATORVASTATIN CALCIUM 10 MG PO TABS: 10.0000 mg | ORAL_TABLET | Freq: Every evening | ORAL | Status: DC

## 2015-11-29 NOTE — Assessment & Plan Note (Signed)
I have personally reviewed the Medicare Annual Wellness questionnaire and have noted 1. The patient's medical and social history 2. Their use of alcohol, tobacco or illicit drugs 3. Their current medications and supplements 4. The patient's functional ability including ADL's, fall risks, home safety risks and hearing or visual             impairment. 5. Diet and physical activities 6. Evidence for depression or mood disorders  The patients weight, height, BMI and visual acuity have been recorded in the chart I have made referrals, counseling and provided education to the patient based review of the above and I have provided the pt with a written personalized care plan for preventive services.  I have provided you with a copy of your personalized plan for preventive services. Please take the time to review along with your updated medication list.  Flu vaccine today Doesn't want any cancer screening

## 2015-11-29 NOTE — Assessment & Plan Note (Signed)
Chronic dysthymia due to disability No MDD or other need for treatment

## 2015-11-29 NOTE — Addendum Note (Signed)
Addended by: Daralene Milch C on: 11/29/2015 02:20 PM   Modules accepted: Miquel Dunn

## 2015-11-29 NOTE — Progress Notes (Signed)
Subjective:    Patient ID: [name], male    DOB: July 01, 1956, 59 y.o.   MRN: [id number]  HPI Here for initial Medicare wellness and follow up of chronic medical conditions Reviewed advanced directives No hospitalizations or surgery in past years Other doctors-- Hochrein, Taylor--cardiology Still smokes--- this is not negotiable No alcohol No exercise but tries to garden and do yard work Reviewed meds 2 falls in past year--no injury (legs gave out) No sig depression. Not anhedonic (likes gardening, working on truck, has greenhouse) Vision is okay.  Hearing is not great, has whistling. Can't afford hearing aide Independent with ADLs--wife does most of the instrumental ADLs--but he could Frequent memory problems--nothing serious. Mostly forgets directions  Doesn't check sugars No hypoglycemic reactions Chronic numbness and pain in feet. No ulcers  Still gets leg pain if he pushes too much Unable to be stented May be some worse in the past year  Will have occasional chest pain-- usually after he does something and sits Better with sitting or goes away on its own Some dizziness at times No SOB--- some increased DOE lately Doesn't weigh himself--- advised him to get scale again Some edema Sleeps in recliner-- no PND  Regular cough---occasional mucus ?some increased DOE but not striking Doesn't seem to have more lung problems  Current Outpatient Prescriptions on File Prior to Visit  Medication Sig Dispense Refill  . albuterol (PROVENTIL HFA;VENTOLIN HFA) 108 (90 BASE) MCG/ACT inhaler Inhale 2 puffs into the lungs 4 (four) times daily as needed for wheezing or shortness of breath. 1 Inhaler 1  . aspirin 81 MG tablet Take 81 mg by mouth daily.     Marland Kitchen atorvastatin (LIPITOR) 10 MG tablet Take 10 mg by mouth every evening.    . bisoprolol (ZEBETA) 10 MG tablet Take 0.5 tablets (5 mg total) by mouth daily. 30 tablet 6  . enalapril (VASOTEC) 20 MG tablet Take 0.5 tablets (10 mg  total) by mouth daily. 30 tablet 9  . gemfibrozil (LOPID) 600 MG tablet TAKE 1 TABLET BY MOUTH TWICE A DAY BEFORE A MEAL 60 tablet 11  . isosorbide mononitrate (ISMO,MONOKET) 20 MG tablet TAKE 1 TABLET BY MOUTH TWICE A DAY 60 tablet 10  . multivitamin (THERAGRAN) per tablet Take 1 tablet by mouth daily.     . vitamin B-12 (CYANOCOBALAMIN) 1000 MCG tablet Take 1,000 mcg by mouth daily.      No current facility-administered medications on file prior to visit.    No Known Allergies  Past Medical History  Diagnosis Date  . Diabetes mellitus     type 2  . GERD (gastroesophageal reflux disease)     with esophagitis  . PVD (peripheral vascular disease) ([place])     bilateral common iliac artery aneurysms. Right SFA occlusion over a long segment. Left SFA disease with occlusion of the left TP trunk. Artirogram Oct. 2006  . History of colonic polyps   . Diverticulosis of colon   . Sleep apnea   . Non-ischemic cardiomyopathy ([place])   . Coronary artery disease      LAD a 60-70% stenosis, moderate size second diagonal with 50% stenosis, circumflex AV groove 75-80% stenosis with a branching obtuse marginal with a superior branch subtotal stenosis followed by diffuse disease, right coronary artery  had nonobstructive disease.  EF was 35%  . COPD (chronic obstructive pulmonary disease) ([place])     emphysema by CXR  . Tobacco abuse   . Urinary incontinence     detrussor  instability  . Automatic implantable cardioverter-defibrillator in situ   . Shortness of breath     exertion  . Arthritis   . Hypertension     Past Surgical History  Procedure Laterality Date  . S/p icd placement       Medtronic Maximo [phone] single chamber  . Colonoscopy  04/2005  . Cardiac catheterization  06/2004    negative  . Pacemaker insertion  11/2005  . Copd exacerbation    . Pacemaker lead removal N/A 10/17/2014    Procedure: PACEMAKER LEAD REMOVAL;  Surgeon: Evans Lance, MD;  Location: [facility] OR;  Service:  Cardiovascular;  Laterality: N/A;    Family History  Problem Relation Age of Onset  . Stroke Father   . Coronary artery disease Maternal Aunt   . Heart failure Maternal Aunt   . Lung cancer Maternal Aunt   . Lung cancer Maternal Uncle   . Cancer Brother     Mouth  . Cancer Sister     throat    Social History   Social History  . Marital Status: Married    Spouse Name: N/A  . Number of Children: 2  . Years of Education: N/A   Occupational History  . Grave digger--now disabled    Social History Main Topics  . Smoking status: Current Every Day Smoker -- 2.00 packs/day for 15 years    Types: Cigarettes  . Smokeless tobacco: Never Used     Comment: GAVE 1-800-QUIT-NOW  . Alcohol Use: No  . Drug Use: No  . Sexual Activity: No   Other Topics Concern  . Not on file   Social History Narrative   No living will   Requests wife as health care POA   Would accept resuscitation but doesn't want prolonged ventilation   No tube feeds if cognitively unaware   Review of Systems Sleeps fair--awakens 15 times for nocturia. No longer uses CPAP Appetite is okay Weight fairly stable Wears seat belt Bowels are fine Urinary frequency day and --did have urology testing. Hurts everywhere--no meds except occasional tylenol No rashes or suspicious lesions     Objective:   Physical Exam  Constitutional: He is oriented to person, place, and time. He appears well-developed and well-nourished. No distress.  HENT:  Mouth/Throat: Oropharynx is clear and moist. No oropharyngeal exudate.  edentulous  Neck: Normal range of motion. Neck supple. No thyromegaly present.  Cardiovascular: Normal rate, regular rhythm and normal heart sounds.  Exam reveals no gallop.   No murmur heard. Very faint distal pulses  Pulmonary/Chest: Effort normal. No respiratory distress. He has no rales.  Mild decreased breath sounds Slight exp wheeze--but not tight  Abdominal: Soft. There is no tenderness.    Musculoskeletal: He exhibits no edema or tenderness.  Lymphadenopathy:    He has no cervical adenopathy.  Neurological: He is alert and oriented to person, place, and time.  President-- "Obama, Clinton-- (then) Maudie Flakes" [id number] Recall 2/3  Decreased sensation in feet  Skin:  No foot ulcers Mild plantar callous on right  Psychiatric:  Same somewhat flat affect but no overt depression          Assessment & Plan:

## 2015-11-29 NOTE — Assessment & Plan Note (Signed)
Persists but no worse

## 2015-11-29 NOTE — Addendum Note (Signed)
Addended by: Despina Hidden on: 11/29/2015 02:13 PM   Modules accepted: Orders

## 2015-11-29 NOTE — Assessment & Plan Note (Signed)
Doing well Asked him to weigh daily again ??consider sacubitril (doubt he could afford)

## 2015-11-29 NOTE — Assessment & Plan Note (Signed)
Hopefully still good control Check labs Set up eye exam Foot pain not enough for meds Ongoing claudication

## 2015-11-29 NOTE — Assessment & Plan Note (Signed)
Uses inhaler once in a while Not willing to stop smoking

## 2015-11-29 NOTE — Progress Notes (Signed)
Pre visit review using our clinic review tool, if applicable. No additional management support is needed unless otherwise documented below in the visit note. 

## 2015-11-29 NOTE — Assessment & Plan Note (Signed)
Has vague chest pain but nothing persistent on the isosorbide

## 2015-11-30 ENCOUNTER — Encounter: Payer: Self-pay | Admitting: Internal Medicine

## 2015-12-06 LAB — HM DIABETES EYE EXAM

## 2015-12-07 ENCOUNTER — Ambulatory Visit: Payer: BLUE CROSS/BLUE SHIELD | Admitting: Cardiology

## 2015-12-27 ENCOUNTER — Inpatient Hospital Stay (HOSPITAL_COMMUNITY): Admission: RE | Admit: 2015-12-27 | Payer: BLUE CROSS/BLUE SHIELD | Source: Ambulatory Visit

## 2015-12-27 ENCOUNTER — Other Ambulatory Visit: Payer: Self-pay | Admitting: Cardiology

## 2015-12-27 DIAGNOSIS — R0989 Other specified symptoms and signs involving the circulatory and respiratory systems: Secondary | ICD-10-CM

## 2015-12-27 DIAGNOSIS — I6523 Occlusion and stenosis of bilateral carotid arteries: Secondary | ICD-10-CM

## 2016-01-05 ENCOUNTER — Encounter: Payer: Self-pay | Admitting: Cardiology

## 2016-01-05 ENCOUNTER — Ambulatory Visit (INDEPENDENT_AMBULATORY_CARE_PROVIDER_SITE_OTHER): Payer: BLUE CROSS/BLUE SHIELD | Admitting: Cardiology

## 2016-01-05 VITALS — BP 132/80 | HR 64 | Ht 71.0 in | Wt 263.2 lb

## 2016-01-05 DIAGNOSIS — I2510 Atherosclerotic heart disease of native coronary artery without angina pectoris: Secondary | ICD-10-CM

## 2016-01-05 DIAGNOSIS — I6523 Occlusion and stenosis of bilateral carotid arteries: Secondary | ICD-10-CM

## 2016-01-05 NOTE — Patient Instructions (Signed)
Medication Instructions:  Your physician recommends that you continue on your current medications as directed. Please refer to the Current Medication list given to you today.  Labwork: NONE  Testing/Procedures: Will get your dopplers rescheduled   Follow-Up: Your physician wants you to follow-up in: 1 year ov You will receive a reminder letter in the mail two months in advance. If you don't receive a letter, please call our office to schedule the follow-up appointment.  If you need a refill on your cardiac medications before your next appointment, please call your pharmacy.

## 2016-01-05 NOTE — Progress Notes (Addendum)
HPI The patient presents for follow up of CAD.   In 2012 he had a stress test indicating anterior ischemia mild but from base to apex.  I sent him for a cath demonstrating LAD a 60-70% stenosis, moderate size second diagonal with 50% stenosis, circumflex AV groove 75-80% stenosis with a branching obtuse marginal with a superior branch subtotal stenosis followed by diffuse disease, right coronary artery had nonobstructive disease.  EF was 35%.   He was managed medically.  He has had an ICD.  However, when he had ERI Dr. Lovena Le elected not to replace the device. He had a recall lead and it was removed.   Since I last saw him he has continued to be limited predominantly by leg pain related to his diffuse and severe peripheral vascular disease. He also reports that he has nothing to do all day. He is very sedentary. He does have dyspnea with exertion. However, this has been chronic. He's not having any PND or orthopnea. He's not having any chest pressure, neck or arm discomfort. He's had no weight gain or edema..   No Known Allergies  Current Outpatient Prescriptions  Medication Sig Dispense Refill  . albuterol (PROVENTIL HFA;VENTOLIN HFA) 108 (90 BASE) MCG/ACT inhaler Inhale 2 puffs into the lungs 4 (four) times daily as needed for wheezing or shortness of breath. 1 Inhaler 1  . aspirin 81 MG tablet Take 81 mg by mouth daily.     Marland Kitchen atorvastatin (LIPITOR) 10 MG tablet Take 1 tablet (10 mg total) by mouth every evening. 90 tablet 3  . bisoprolol (ZEBETA) 10 MG tablet Take 0.5 tablets (5 mg total) by mouth daily. 45 tablet 3  . enalapril (VASOTEC) 20 MG tablet Take 0.5 tablets (10 mg total) by mouth daily. 45 tablet 3  . gemfibrozil (LOPID) 600 MG tablet Take 1 tablet (600 mg total) by mouth 2 (two) times daily before a meal. 180 tablet 3  . isosorbide mononitrate (ISMO,MONOKET) 20 MG tablet Take 1 tablet (20 mg total) by mouth 2 (two) times daily. 180 tablet 3  . multivitamin (THERAGRAN) per tablet  Take 1 tablet by mouth daily.     . vitamin B-12 (CYANOCOBALAMIN) 1000 MCG tablet Take 1,000 mcg by mouth daily.      No current facility-administered medications for this visit.    Past Medical History  Diagnosis Date  . Diabetes mellitus     type 2  . GERD (gastroesophageal reflux disease)     with esophagitis  . PVD (peripheral vascular disease) ([place])     bilateral common iliac artery aneurysms. Right SFA occlusion over a long segment. Left SFA disease with occlusion of the left TP trunk. Artirogram Oct. 2006  . History of colonic polyps   . Diverticulosis of colon   . Sleep apnea   . Non-ischemic cardiomyopathy ([place])   . Coronary artery disease      LAD a 60-70% stenosis, moderate size second diagonal with 50% stenosis, circumflex AV groove 75-80% stenosis with a branching obtuse marginal with a superior branch subtotal stenosis followed by diffuse disease, right coronary artery  had nonobstructive disease.  EF was 35%  . COPD (chronic obstructive pulmonary disease) ([place])     emphysema by CXR  . Tobacco abuse   . Urinary incontinence     detrussor instability  . Automatic implantable cardioverter-defibrillator in situ   . Shortness of breath     exertion  . Arthritis   . Hypertension     Past  Surgical History  Procedure Laterality Date  . S/p icd placement       Medtronic Maximo [phone] single chamber  . Colonoscopy  04/2005  . Cardiac catheterization  06/2004    negative  . Pacemaker insertion  11/2005  . Copd exacerbation    . Pacemaker lead removal N/A 10/17/2014    Procedure: PACEMAKER LEAD REMOVAL;  Surgeon: Evans Lance, MD;  Location: [facility] OR;  Service: Cardiovascular;  Laterality: N/A;    ROS:  Leg pain.  Otherwise as stated in the HPI and negative for all other systems.  PHYSICAL EXAM BP 132/80 mmHg  Pulse 64  Ht 5\' 11"  (1.803 m)  Wt 263 lb 3 oz (119.381 kg)  BMI 36.72 kg/m2 GENERAL:  Well appearing NECK:  No jugular venous distention, waveform within  normal limits, carotid upstroke brisk and symmetric, no bruits, no thyromegaly LUNGS:  Clear to auscultation bilaterally BACK:  No CVA tenderness CHEST: ICD scar well healed HEART:  PMI not displaced or sustained,S1 and S2 within normal limits, no S3, no S4, no clicks, no rubs, no murmurs ABD:  Flat, positive bowel sounds normal in frequency in pitch, no bruits, no rebound, no guarding, no midline pulsatile mass, no hepatomegaly, no splenomegaly, obese EXT:  2 plus pulses upper, absent DP/PT, no edema, no cyanosis no clubbing, right wrist OK NEURO:  Flat affect.   EKG:  Sinus rhythm, rate 65, axis within normal limits, intervals within normal limits, no acute ST-T wave changes.   01/05/2016    ASSESSMENT AND PLAN  CAD:   The patient has no new sypmtoms.  No further cardiovascular testing is indicated.  We will continue with aggressive risk reduction and meds as listed.    CAROTID STENOSIS:  He had 60 - 79% on the left. He missed his follow-up study and I will rearrange this.  TOBACCO ABUSE:  He will not quit smoking and we talked about this today (again).  CARDIOMYOPATHY:  He seems to be euvolemic.  Marland Kitchen  At this point, no change in therapy is indicated.    DYSLIPIDEMIA:  I reviewed his last LDL which was 73 a few weeks ago. He will continue the meds as listed.  DEPRESSED AFFECT: I think this plays into his failure to comply with secondary risk reduction. We talked at length about the good things going on in his life including his 13 young grandchildren. I encouraged him to talk with Viviana Simpler, MD further.

## 2016-01-11 ENCOUNTER — Ambulatory Visit (HOSPITAL_COMMUNITY)
Admission: RE | Admit: 2016-01-11 | Discharge: 2016-01-11 | Disposition: A | Discharge status: Home / Self Care | Payer: BLUE CROSS/BLUE SHIELD | Source: Ambulatory Visit | Attending: Cardiology | Admitting: Cardiology

## 2016-01-11 DIAGNOSIS — I2510 Atherosclerotic heart disease of native coronary artery without angina pectoris: Secondary | ICD-10-CM | POA: Diagnosis not present

## 2016-01-11 DIAGNOSIS — E785 Hyperlipidemia, unspecified: Secondary | ICD-10-CM | POA: Insufficient documentation

## 2016-01-11 DIAGNOSIS — R0989 Other specified symptoms and signs involving the circulatory and respiratory systems: Secondary | ICD-10-CM

## 2016-01-11 DIAGNOSIS — I6523 Occlusion and stenosis of bilateral carotid arteries: Secondary | ICD-10-CM | POA: Insufficient documentation

## 2016-01-11 DIAGNOSIS — J449 Chronic obstructive pulmonary disease, unspecified: Secondary | ICD-10-CM | POA: Diagnosis not present

## 2016-01-11 DIAGNOSIS — Z87891 Personal history of nicotine dependence: Secondary | ICD-10-CM | POA: Insufficient documentation

## 2016-01-11 DIAGNOSIS — E119 Type 2 diabetes mellitus without complications: Secondary | ICD-10-CM | POA: Insufficient documentation

## 2016-01-11 DIAGNOSIS — I739 Peripheral vascular disease, unspecified: Secondary | ICD-10-CM | POA: Diagnosis not present

## 2016-01-11 DIAGNOSIS — I10 Essential (primary) hypertension: Secondary | ICD-10-CM | POA: Diagnosis not present

## 2016-05-29 ENCOUNTER — Telehealth: Payer: Self-pay | Admitting: Internal Medicine

## 2016-05-29 ENCOUNTER — Ambulatory Visit: Payer: BLUE CROSS/BLUE SHIELD | Admitting: Internal Medicine

## 2016-05-29 NOTE — Telephone Encounter (Signed)
Please reschedule at his earliest convenience

## 2016-05-29 NOTE — Telephone Encounter (Signed)
6/14  Pt awaare

## 2016-05-29 NOTE — Telephone Encounter (Signed)
Patient did not come for their scheduled appointment today for 6 month follow up Please let me know if the patient needs to be contacted immediately for follow up or if no follow up is necessary.   ° °

## 2016-06-05 ENCOUNTER — Encounter: Payer: Self-pay | Admitting: Internal Medicine

## 2016-06-05 ENCOUNTER — Ambulatory Visit (INDEPENDENT_AMBULATORY_CARE_PROVIDER_SITE_OTHER): Payer: BLUE CROSS/BLUE SHIELD | Admitting: Internal Medicine

## 2016-06-05 VITALS — BP 144/78 | HR 69 | Temp 97.6°F | Wt 259.0 lb

## 2016-06-05 DIAGNOSIS — I25119 Atherosclerotic heart disease of native coronary artery with unspecified angina pectoris: Secondary | ICD-10-CM | POA: Diagnosis not present

## 2016-06-05 DIAGNOSIS — F39 Unspecified mood [affective] disorder: Secondary | ICD-10-CM

## 2016-06-05 DIAGNOSIS — E1151 Type 2 diabetes mellitus with diabetic peripheral angiopathy without gangrene: Secondary | ICD-10-CM | POA: Diagnosis not present

## 2016-06-05 DIAGNOSIS — J439 Emphysema, unspecified: Secondary | ICD-10-CM

## 2016-06-05 DIAGNOSIS — I6523 Occlusion and stenosis of bilateral carotid arteries: Secondary | ICD-10-CM

## 2016-06-05 LAB — HEMOGLOBIN A1C: Hgb A1c MFr Bld: 5.8 % (ref 4.6–6.5)

## 2016-06-05 NOTE — Assessment & Plan Note (Signed)
Chronic dysthymia No MDD Afraid of meds--and no absolute indication that they would help

## 2016-06-05 NOTE — Assessment & Plan Note (Signed)
Not willing to stop smoking No major issues with symptoms

## 2016-06-05 NOTE — Progress Notes (Signed)
Pre visit review using our clinic review tool, if applicable. No additional management support is needed unless otherwise documented below in the visit note. 

## 2016-06-05 NOTE — Assessment & Plan Note (Signed)
Hopefully still good control Will check A1c 

## 2016-06-05 NOTE — Progress Notes (Signed)
Subjective:    Patient ID: [name], male    DOB: 27-Dec-1955, 60 y.o.   MRN: [id number]  HPI Here for follow up of diabetes and other chronic health conditions  Having lots of pain--- hot weather making it worse Can't even go out in garden Hips especially bad Understands he has chronic PAD--- still not willing to stop smoking Did have relief from ibuprofen--does this rarely (we have discussed)  Ongoing mood issues Not MDD--he doesn't want meds Still gets a lot of satisfaction from garden and other things  Not checking sugars No low sugar reactions No foot ulcers--but injuries are slow to heal Chronic foot and leg pain Had eye exam at end of last year---no retinopathy  No chest pain Rare palpitations--not prominent Dizzy all the time Breathing is okay Still has regular cough  Current Outpatient Prescriptions on File Prior to Visit  Medication Sig Dispense Refill  . albuterol (PROVENTIL HFA;VENTOLIN HFA) 108 (90 BASE) MCG/ACT inhaler Inhale 2 puffs into the lungs 4 (four) times daily as needed for wheezing or shortness of breath. 1 Inhaler 1  . aspirin 81 MG tablet Take 81 mg by mouth daily.     Marland Kitchen atorvastatin (LIPITOR) 10 MG tablet Take 1 tablet (10 mg total) by mouth every evening. 90 tablet 3  . bisoprolol (ZEBETA) 10 MG tablet Take 0.5 tablets (5 mg total) by mouth daily. 45 tablet 3  . enalapril (VASOTEC) 20 MG tablet Take 0.5 tablets (10 mg total) by mouth daily. 45 tablet 3  . gemfibrozil (LOPID) 600 MG tablet Take 1 tablet (600 mg total) by mouth 2 (two) times daily before a meal. 180 tablet 3  . isosorbide mononitrate (ISMO,MONOKET) 20 MG tablet Take 1 tablet (20 mg total) by mouth 2 (two) times daily. 180 tablet 3  . multivitamin (THERAGRAN) per tablet Take 1 tablet by mouth daily.     . vitamin B-12 (CYANOCOBALAMIN) 1000 MCG tablet Take 1,000 mcg by mouth daily.      No current facility-administered medications on file prior to visit.    No Known  Allergies  Past Medical History  Diagnosis Date  . Diabetes mellitus     type 2  . GERD (gastroesophageal reflux disease)     with esophagitis  . PVD (peripheral vascular disease) ([place])     bilateral common iliac artery aneurysms. Right SFA occlusion over a long segment. Left SFA disease with occlusion of the left TP trunk. Artirogram Oct. 2006  . History of colonic polyps   . Diverticulosis of colon   . Sleep apnea   . Non-ischemic cardiomyopathy ([place])   . Coronary artery disease      LAD a 60-70% stenosis, moderate size second diagonal with 50% stenosis, circumflex AV groove 75-80% stenosis with a branching obtuse marginal with a superior branch subtotal stenosis followed by diffuse disease, right coronary artery  had nonobstructive disease.  EF was 35%  . COPD (chronic obstructive pulmonary disease) ([place])     emphysema by CXR  . Tobacco abuse   . Urinary incontinence     detrussor instability  . Automatic implantable cardioverter-defibrillator in situ   . Shortness of breath     exertion  . Arthritis   . Hypertension     Past Surgical History  Procedure Laterality Date  . S/p icd placement       Medtronic Maximo [phone] single chamber  . Colonoscopy  04/2005  . Cardiac catheterization  06/2004    negative  .  Pacemaker insertion  11/2005  . Copd exacerbation    . Pacemaker lead removal N/A 10/17/2014    Procedure: PACEMAKER LEAD REMOVAL;  Surgeon: Evans Lance, MD;  Location: [facility];  Service: Cardiovascular;  Laterality: N/A;    Family History  Problem Relation Age of Onset  . Stroke Father   . Coronary artery disease Maternal Aunt   . Heart failure Maternal Aunt   . Lung cancer Maternal Aunt   . Lung cancer Maternal Uncle   . Cancer Brother     Mouth  . Cancer Sister     throat    Social History   Social History  . Marital Status: Married    Spouse Name: N/A  . Number of Children: 2  . Years of Education: N/A   Occupational History  . Grave  digger--now disabled    Social History Main Topics  . Smoking status: Current Every Day Smoker -- 2.00 packs/day for 15 years    Types: Cigarettes  . Smokeless tobacco: Never Used     Comment: GAVE 1-800-QUIT-NOW  . Alcohol Use: No  . Drug Use: No  . Sexual Activity: No   Other Topics Concern  . Not on file   Social History Narrative   No living will   Requests wife as health care POA   Would accept resuscitation but doesn't want prolonged ventilation   No tube feeds if cognitively unaware   Review of Systems  Sleeps okay--except when having cramps (which can keep him up all night at times) Weight is stable Appetite remains poor     Objective:   Physical Exam  Constitutional: He appears well-developed. No distress.  Neck: Normal range of motion.  Cardiovascular: Normal rate, regular rhythm and normal heart sounds.  Exam reveals no gallop.   No murmur heard. occ extra beats No pedal pulses but fair perfusion (capillary refill only slightly delayed)  Pulmonary/Chest: Effort normal. No respiratory distress. He has no wheezes. He has no rales.  Slightly decreased breath sounds with scant exp rhonchi  Musculoskeletal: He exhibits no edema.  Mild pain with internal rotation of hips  Lymphadenopathy:    He has no cervical adenopathy.  Skin:  No foot lesions  Psychiatric:  Same dysthymic mood          Assessment & Plan:

## 2016-06-05 NOTE — Assessment & Plan Note (Signed)
Fortunately still no chest pain on the isosorbide Very limited exercise tolerance

## 2016-08-22 ENCOUNTER — Ambulatory Visit (INDEPENDENT_AMBULATORY_CARE_PROVIDER_SITE_OTHER): Payer: BLUE CROSS/BLUE SHIELD | Admitting: Family Medicine

## 2016-08-22 ENCOUNTER — Encounter: Payer: Self-pay | Admitting: Family Medicine

## 2016-08-22 VITALS — BP 124/70 | HR 63 | Temp 97.7°F | Wt 265.0 lb

## 2016-08-22 DIAGNOSIS — Z720 Tobacco use: Secondary | ICD-10-CM

## 2016-08-22 DIAGNOSIS — R42 Dizziness and giddiness: Secondary | ICD-10-CM

## 2016-08-22 DIAGNOSIS — E1151 Type 2 diabetes mellitus with diabetic peripheral angiopathy without gangrene: Secondary | ICD-10-CM | POA: Diagnosis not present

## 2016-08-22 DIAGNOSIS — I6523 Occlusion and stenosis of bilateral carotid arteries: Secondary | ICD-10-CM

## 2016-08-22 DIAGNOSIS — R079 Chest pain, unspecified: Secondary | ICD-10-CM | POA: Diagnosis not present

## 2016-08-22 LAB — COMPREHENSIVE METABOLIC PANEL
ALK PHOS: 82 U/L (ref 39–117)
ALT: 22 U/L (ref 0–53)
AST: 20 U/L (ref 0–37)
Albumin: 4.2 g/dL (ref 3.5–5.2)
BUN: 13 mg/dL (ref 6–23)
CHLORIDE: 103 meq/L (ref 96–112)
CO2: 29 meq/L (ref 19–32)
Calcium: 9.2 mg/dL (ref 8.4–10.5)
Creatinine, Ser: 0.81 mg/dL (ref 0.40–1.50)
GFR: 103.4 mL/min (ref >60.00)
GLUCOSE: 110 mg/dL — AB (ref 70–99)
POTASSIUM: 4.9 meq/L (ref 3.5–5.1)
SODIUM: 136 meq/L (ref 135–145)
TOTAL PROTEIN: 7.1 g/dL (ref 6.0–8.3)
Total Bilirubin: 0.5 mg/dL (ref 0.2–1.2)

## 2016-08-22 LAB — CBC
HEMATOCRIT: 45.2 % (ref 39.0–52.0)
HEMOGLOBIN: 15.8 g/dL (ref 13.0–17.0)
MCHC: 34.9 g/dL (ref 30.0–36.0)
MCV: 82.8 fl (ref 78.0–100.0)
Platelets: 251 10*3/uL (ref 150.0–400.0)
RBC: 5.46 Mil/uL (ref 4.22–5.81)
RDW: 13.9 % (ref 11.5–15.5)
WBC: 8.7 10*3/uL (ref 4.0–10.5)

## 2016-08-22 NOTE — Progress Notes (Signed)
Subjective:    Patient ID: [name], male    DOB: 1956-03-12, 60 y.o.   MRN: [id number]  HPI This is a 60 yo male who presents today with 2 weeks of intermittent dizziness. Usually occurs mid morning, everything gets blurry and he feels like he is going to pass out. Episodes used to occur 2-3 times a week and would resolve with sleep but recent episodes have been daily and lasting longer and are not going away with rest. No spinning sensation. Does not have symptoms this morning. Last HgbA1c 06/05/16- 5.8.  Having left anterior chest pain about once a day, lasts a few minutes. Not related to exertion. He is currently working on getting his green house ready for the winter (tomatoes, cucumbers, peppers). Works short periods of time then rests. Can walk short distances. SOB might be a little worse. Has chronic, productive cough, no worse.  Has left upper quadrant abdominal pain randomly throughout the day, doesn't last long, resolves spontaneously. Bowel movements regular, usually daily. Last BM this morning.   Continues to smoke 2 ppd. Has quit for 3 years in the past; he felt terrible. People told him he looked sick- dark circles under his eyes. Drinks 2-3 Propel daily, 2 liters of soda and some water.  Talked about being an undertaker for 40 years and is not afraid of dying; doesn't want to be confined to a bed. Verbalized inevitability of losing his legs to amputation due to PVD.   Past Medical History:  Diagnosis Date  . Arthritis   . Automatic implantable cardioverter-defibrillator in situ   . COPD (chronic obstructive pulmonary disease) ([place])    emphysema by CXR  . Coronary artery disease     LAD a 60-70% stenosis, moderate size second diagonal with 50% stenosis, circumflex AV groove 75-80% stenosis with a branching obtuse marginal with a superior branch subtotal stenosis followed by diffuse disease, right coronary artery  had nonobstructive disease.  EF was 35%  . Diabetes mellitus     type 2  . Diverticulosis of colon   . GERD (gastroesophageal reflux disease)    with esophagitis  . History of colonic polyps   . Hypertension   . Non-ischemic cardiomyopathy ([place])   . PVD (peripheral vascular disease) ([place])    bilateral common iliac artery aneurysms. Right SFA occlusion over a long segment. Left SFA disease with occlusion of the left TP trunk. Artirogram Oct. 2006  . Shortness of breath    exertion  . Sleep apnea   . Tobacco abuse   . Urinary incontinence    detrussor instability   Past Surgical History:  Procedure Laterality Date  . CARDIAC CATHETERIZATION  06/2004   negative  . COLONOSCOPY  04/2005  . COPD exacerbation    . PACEMAKER INSERTION  11/2005  . PACEMAKER LEAD REMOVAL N/A 10/17/2014   Procedure: PACEMAKER LEAD REMOVAL;  Surgeon: Evans Lance, MD;  Location: [facility] OR;  Service: Cardiovascular;  Laterality: N/A;  . s/p ICD placement      Medtronic Maximo [phone] single chamber   Family History  Problem Relation Age of Onset  . Stroke Father   . Coronary artery disease Maternal Aunt   . Heart failure Maternal Aunt   . Lung cancer Maternal Aunt   . Lung cancer Maternal Uncle   . Cancer Brother     Mouth  . Cancer Sister     throat   Social History  Substance Use Topics  . Smoking status: Current  Every Day Smoker    Packs/day: 2.00    Years: 15.00    Types: Cigarettes  . Smokeless tobacco: Never Used     Comment: GAVE 1-800-QUIT-NOW  . Alcohol use No      Review of Systems Per HPI    Objective:   Physical Exam  Constitutional: He is oriented to person, place, and time. He appears well-developed and well-nourished. No distress.  Obese, appears much older than stated age.   HENT:  Head: Normocephalic and atraumatic.  Eyes: Conjunctivae are normal.  Neck: Normal range of motion. Neck supple. No JVD present. Carotid bruit is not present.  Cardiovascular: Normal rate, regular rhythm, normal heart sounds and intact distal pulses.     Pulmonary/Chest: Effort normal and breath sounds normal.  Abdominal: Soft. Bowel sounds are normal. He exhibits no distension. There is no tenderness. There is no rebound and no guarding.  Musculoskeletal: He exhibits no edema.  Neurological: He is alert and oriented to person, place, and time.  Skin: Skin is warm and dry. He is not diaphoretic.  Psychiatric: He has a normal mood and affect. His behavior is normal. Judgment and thought content normal.  Vitals reviewed.     BP 124/70   Pulse 63   Temp 97.7 F (36.5 C)   Wt 265 lb (120.2 kg)   SpO2 95%   BMI 36.96 kg/m  EKG- unchanged from previous tracings. SR with first degree AV block    Assessment & Plan:  1. Chest pain, unspecified chest pain type - does not seem to be acute, no EKG changes, resolves within a couple of minutes and not related to exertion - EKG 12-Lead - CBC - Comprehensive metabolic panel - Ambulatory referral to Cardiology- he is due for his regular follow up  2. Dizziness - not symptomatic today, has known carotid artery stenosis - EKG 12-Lead - CBC - Comprehensive metabolic panel - Ambulatory referral to Cardiology  3. Type 2 diabetes, controlled, with peripheral circulatory disorder (HCC) - has been well controlled over last 2 years, encouraged decreased soda intake  4. Tobacco abuse disorder - patient verbalizes unwillingness to quit, discussed benefits and encouraged him to consider decreasing consumption.    Clarene Reamer, FNP-BC  [facility] at [facility], [facility]  08/22/2016 11:39 AM

## 2016-08-22 NOTE — Progress Notes (Signed)
Pre visit review using our clinic review tool, if applicable. No additional management support is needed unless otherwise documented below in the visit note. 

## 2016-08-22 NOTE — Patient Instructions (Signed)
Please stop and see Rosaria Ferries on your way out-she will make a cardiology appointment for you.

## 2016-08-27 ENCOUNTER — Encounter: Payer: Self-pay | Admitting: Physician Assistant

## 2016-08-27 ENCOUNTER — Ambulatory Visit (INDEPENDENT_AMBULATORY_CARE_PROVIDER_SITE_OTHER): Payer: Medicare Other | Admitting: Physician Assistant

## 2016-08-27 VITALS — BP 138/84 | HR 65 | Ht 69.0 in | Wt 264.2 lb

## 2016-08-27 DIAGNOSIS — I10 Essential (primary) hypertension: Secondary | ICD-10-CM | POA: Diagnosis not present

## 2016-08-27 DIAGNOSIS — I6523 Occlusion and stenosis of bilateral carotid arteries: Secondary | ICD-10-CM

## 2016-08-27 DIAGNOSIS — R55 Syncope and collapse: Secondary | ICD-10-CM

## 2016-08-27 DIAGNOSIS — E785 Hyperlipidemia, unspecified: Secondary | ICD-10-CM | POA: Diagnosis not present

## 2016-08-27 MED ORDER — BISOPROLOL FUMARATE 5 MG PO TABS: 2.5000 mg | ORAL_TABLET | Freq: Every day | ORAL | 3 refills | Status: DC

## 2016-08-27 MED ORDER — ISOSORBIDE MONONITRATE 10 MG PO TABS: 10.0000 mg | ORAL_TABLET | Freq: Two times a day (BID) | ORAL | 3 refills | Status: DC

## 2016-08-27 NOTE — Progress Notes (Signed)
Cardiology Office Note   Date:  08/27/2016   ID:  [name], [name] June 30, 1956, MRN [id number]  PCP:  Viviana Simpler, MD  Cardiologist:  Dr Pedro Earls, Suanne Marker, PA-C   History of Present Illness: [name] is a 60 y.o. male with a history of COPD, DM, HTN, GERD, CAD w/ LAD 70%, D2 50%, AVCFX 80%, OM branch 99%, RCA non-obs dz, med rx, EF 35% by cath 2012. S/p ICD, not replaced when it reached ERI. PAD w/ bilat CIA aneurysms, R-SFA 100%, L-SFA dz, TP trunk 100% 2006, L-ICA 60-79%. Ongoing tobacco use.   [name] presents for evaluation of dizziness.  The dizziness has been present for a long time. However, it has recently become worse, and the episodes are coming more frequently and lasting longer. Last week, he had some that lasted all day long. Whenever he gets 1, and will start with dizziness that is not clearly orthostatic in nature and can start while he is sitting still. Sometimes it progresses to the point that he loses vision and sees stars. Whenever he is getting 1, he is aware that he needs to sit down or pull over to the side of the road if driving. It will wax and wane, but he has had some episodes that were quite prolonged. They generally seem to occur while he is doing something, but he states they will start with no activity at all. His medications have been decreased in the past, but his symptoms have not improved.  He feels constantly fatigued and his activity is very limited. He admits to depression but does not wish to take any medications for it because friend of his committed suicide while on an antidepressant.  He has significant pain in his legs with ambulation and this is the reason that he ambulates very little. He walks with cane. He continues to smoke.  He gets occasional cramping chest pain, but usually when he is getting ready to go to bed. He chronically sleeps in a recliner because that is more comfortable.   Past Medical History:  Diagnosis  Date  . Arthritis   . Automatic implantable cardioverter-defibrillator in situ   . COPD (chronic obstructive pulmonary disease) ([place])    emphysema by CXR  . Coronary artery disease     LAD a 60-70% stenosis, moderate size second diagonal with 50% stenosis, circumflex AV groove 75-80% stenosis with a branching obtuse marginal with a superior branch subtotal stenosis followed by diffuse disease, right coronary artery  had nonobstructive disease.  EF was 35%  . Diabetes mellitus    type 2  . Diverticulosis of colon   . GERD (gastroesophageal reflux disease)    with esophagitis  . History of colonic polyps   . Hypertension   . Non-ischemic cardiomyopathy ([place])   . PVD (peripheral vascular disease) ([place])    bilateral common iliac artery aneurysms. Right SFA occlusion over a long segment. Left SFA disease with occlusion of the left TP trunk. Artirogram Oct. 2006  . Shortness of breath    exertion  . Sleep apnea   . Tobacco abuse   . Urinary incontinence    detrussor instability    Past Surgical History:  Procedure Laterality Date  . CARDIAC CATHETERIZATION  06/2004   negative  . COLONOSCOPY  04/2005  . COPD exacerbation    . PACEMAKER INSERTION  11/2005  . PACEMAKER LEAD REMOVAL N/A 10/17/2014   Procedure: PACEMAKER LEAD REMOVAL;  Surgeon: Champ Mungo  Lovena Le, MD;  Location: [facility];  Service: Cardiovascular;  Laterality: N/A;  . s/p ICD placement      Medtronic Maximo [phone] single chamber    Current Outpatient Prescriptions  Medication Sig Dispense Refill  . albuterol (PROVENTIL HFA;VENTOLIN HFA) 108 (90 BASE) MCG/ACT inhaler Inhale 2 puffs into the lungs 4 (four) times daily as needed for wheezing or shortness of breath. 1 Inhaler 1  . aspirin 81 MG tablet Take 81 mg by mouth daily.     Marland Kitchen atorvastatin (LIPITOR) 10 MG tablet Take 1 tablet (10 mg total) by mouth every evening. 90 tablet 3  . bisoprolol (ZEBETA) 10 MG tablet Take 0.5 tablets (5 mg total) by mouth daily. 45 tablet 3  .  enalapril (VASOTEC) 20 MG tablet Take 0.5 tablets (10 mg total) by mouth daily. 45 tablet 3  . gemfibrozil (LOPID) 600 MG tablet Take 1 tablet (600 mg total) by mouth 2 (two) times daily before a meal. 180 tablet 3  . isosorbide mononitrate (ISMO,MONOKET) 20 MG tablet Take 1 tablet (20 mg total) by mouth 2 (two) times daily. 180 tablet 3  . multivitamin (THERAGRAN) per tablet Take 1 tablet by mouth daily.     . vitamin B-12 (CYANOCOBALAMIN) 1000 MCG tablet Take 1,000 mcg by mouth daily.      No current facility-administered medications for this visit.     Allergies:   Review of patient's allergies indicates no known allergies.    Social History:  The patient  reports that he has been smoking Cigarettes.  He has a 30.00 pack-year smoking history. He has never used smokeless tobacco. He reports that he does not drink alcohol or use drugs.   Family History:  The patient's family history includes Cancer in his brother and sister; Coronary artery disease in his maternal aunt; Heart failure in his maternal aunt; Lung cancer in his maternal aunt and maternal uncle; Stroke in his father.    ROS:  Please see the history of present illness. All other systems are reviewed and negative.    PHYSICAL EXAM: VS:  BP 138/84   Pulse 65   Ht 5\' 9"  (1.753 m)   Wt 264 lb 3.2 oz (119.8 kg)   BMI 39.02 kg/m  , BMI Body mass index is 39.02 kg/m. GEN: Well nourished, well developed, male in no acute distress  HEENT: normal for age  Neck: no JVD, no carotid bruit, no masses Cardiac: RRR; no murmur, no rubs, or gallops Respiratory:  clear to auscultation bilaterally, normal work of breathing GI: soft, nontender, nondistended, + BS MS: no deformity or atrophy; trace edema; distal pulses are 2+ in upper extremities; not palpable in both lower extremities  Skin: warm and dry, no rash Neuro:  Strength and sensation are intact Psych: Depressed mood    EKG:  EKG is not ordered today.  Recent  Labs: 08/22/2016: ALT 22; BUN 13; Creatinine, Ser 0.81; Hemoglobin 15.8; Platelets 251.0; Potassium 4.9; Sodium 136    Lipid Panel    Component Value Date/Time   CHOL 218 (H) 11/29/2015 1056   TRIG 296.0 (H) 11/29/2015 1056   TRIG 575 (HH) 01/02/2007 0000   HDL 31.90 (L) 11/29/2015 1056   CHOLHDL 7 11/29/2015 1056   VLDL 59.2 (H) 11/29/2015 1056   LDLCALC NOT CALC mg/dL 05/27/2008 2013   LDLDIRECT 73.0 11/29/2015 1056     Wt Readings from Last 3 Encounters:  08/27/16 264 lb 3.2 oz (119.8 kg)  08/22/16 265 lb (120.2 kg)  06/05/16  259 lb (117.5 kg)     Other studies Reviewed: Additional studies/ records that were reviewed today include: Previous office notes and testing.  ASSESSMENT AND PLAN:  1.  Near syncope: He does not have obvious volume overload by exam, and he ambulated as much as his leg pain would allow without a significant decrease in his oxygen saturation. Therefore, I do not believe the problem is respiratory nature.  I don't know if his blood pressure is a problem, he has no significant bradycardia history. He does not have a blood pressure cuff at home. He is encouraged to get a blood pressure cuff at home. To make sure his blood pressure is not low at baseline, we will hold his Vasotec and decrease his bisoprolol down to 2.5 mg daily. We'll also decrease his isosorbide mononitrate to 10 mg twice a day.  If decreasing blood pressure medications does not work, he will need to wear a monitor.   He has moderate carotid disease, and his risk factors are poorly controlled. He does not have carotid bruits on exam. If decreasing blood pressure medications does not improve his symptoms, we will go ahead and schedule his carotid Dopplers.  He is concerned about the expense of the testing, so we will proceed in a stepwise fashion.  2. Depression: He admits that because of his physical limitations, he leaves the house very rarely in will stay in bed all day sometimes. He admits  to depression, but refuses any medications for it. He is still encouraged to follow-up with his family physician.  3. Hyperlipidemia: He states he is taking his medications, but his total cholesterol was elevated last year. Recheck in November   Current medicines are reviewed at length with the patient today.  The patient does not have concerns regarding medicines.  The following changes have been made:  See changes above  Labs/ tests ordered today include: No orders of the defined types were placed in this encounter.    Disposition:   FU with me or Dr. Percival Spanish  Signed, Rosaria Ferries, PA-C  08/27/2016 3:31 PM    [place] Phone: [phone]; Fax: [phone]  This note was written with the assistance of speech recognition software. Please excuse any transcriptional errors.

## 2016-08-27 NOTE — Patient Instructions (Addendum)
Medications:  HOLD Enalapril.  Decrease Bisoprolol to 2.5 mg daily and Isosorbide to 10 mg twice a day.  Other Instructions:  Your physician has requested that you regularly monitor and record your blood pressure readings at home. Please use the same machine at the same time of day to check your readings and record them to bring to your follow-up visit.  ----Call 911 if you feel like you are going to pass out or if you start seeing the sparkling.----  Follow-Up:  Your physician recommends that you schedule a follow-up appointment in: 2-3 weeks with Rosaria Ferries, PA-C.  If you need a refill on your cardiac medications before your next appointment, please call your pharmacy.

## 2016-09-17 ENCOUNTER — Encounter: Payer: Self-pay | Admitting: Physician Assistant

## 2016-09-17 ENCOUNTER — Ambulatory Visit (INDEPENDENT_AMBULATORY_CARE_PROVIDER_SITE_OTHER): Payer: BLUE CROSS/BLUE SHIELD | Admitting: Physician Assistant

## 2016-09-17 VITALS — BP 152/88 | HR 68 | Ht 69.0 in | Wt 267.2 lb

## 2016-09-17 DIAGNOSIS — I6523 Occlusion and stenosis of bilateral carotid arteries: Secondary | ICD-10-CM

## 2016-09-17 DIAGNOSIS — R42 Dizziness and giddiness: Secondary | ICD-10-CM

## 2016-09-17 DIAGNOSIS — I10 Essential (primary) hypertension: Secondary | ICD-10-CM | POA: Diagnosis not present

## 2016-09-17 DIAGNOSIS — I2510 Atherosclerotic heart disease of native coronary artery without angina pectoris: Secondary | ICD-10-CM | POA: Diagnosis not present

## 2016-09-17 MED ORDER — AMLODIPINE BESYLATE 5 MG PO TABS: ORAL_TABLET | ORAL | 3 refills | Status: DC

## 2016-09-17 NOTE — Progress Notes (Signed)
Cardiology Office Note   Date:  09/17/2016   ID:  Maximilliano, Wheeler July 03, 1956, MRN [id number]  PCP:  Viviana Simpler, MD  Cardiologist:  Dr Percival Spanish 11/2014  Rosaria Ferries, PA-C   Chief Complaint  Patient presents with  . Follow-up    syncope  . Hypertension    History of Present Illness: [name] is a 60 y.o. male with a history of CAD managed medically, MDT ICD at [facility] and not replaced, recall lead removed, PAD w/ carotid stenosis and bilateral SFA dz, OSA, COPD, GERD, HTN   Seen in the office on 09/05 with weakness and dizziness that was significantly lifestyle limiting. Vasotec was held, isosorbide and bisoprolol were decreased.  [name] presents for further evaluation of his dizziness and HTN  Since his last Office visit, he made the medication changes recommended. His symptoms have gradually improved. He has been able to get out in the garden work and be active around the house without any difficulty at all. This last week was a good week. He is no longer sleeping most of the day. He feels like he is back to his old self.  He has not had chest pain or shortness of breath with activity.  He has been tracking his blood pressure. His systolic blood pressures are generally greater than 140 and most of them are 150 or better. Diastolic blood pressure is also elevated, frequently above 100 and generally 90 or better. He brought in his blood pressure cuff from home today for calibration. His blood pressure cuff and got a blood pressure 150/93 and our blood pressure cuff, blood pressure 152/88, well within margin of error.  He has not had lower extremity edema, denies orthopnea or PND. He continues to smoke.   Past Medical History:  Diagnosis Date  . Arthritis   . Automatic implantable cardioverter-defibrillator in situ   . COPD (chronic obstructive pulmonary disease) ([place])    emphysema by CXR  . Coronary artery disease     LAD a 60-70% stenosis, moderate size  second diagonal with 50% stenosis, circumflex AV groove 75-80% stenosis with a branching obtuse marginal with a superior branch subtotal stenosis followed by diffuse disease, right coronary artery  had nonobstructive disease.  EF was 35%  . Diabetes mellitus    type 2  . Diverticulosis of colon   . GERD (gastroesophageal reflux disease)    with esophagitis  . History of colonic polyps   . Hypertension   . Non-ischemic cardiomyopathy ([place])   . PVD (peripheral vascular disease) ([place])    bilateral common iliac artery aneurysms. Right SFA occlusion over a long segment. Left SFA disease with occlusion of the left TP trunk. Artirogram Oct. 2006  . Shortness of breath    exertion  . Sleep apnea   . Tobacco abuse   . Urinary incontinence    detrussor instability    Past Surgical History:  Procedure Laterality Date  . CARDIAC CATHETERIZATION  06/2004   negative  . COLONOSCOPY  04/2005  . COPD exacerbation    . PACEMAKER INSERTION  11/2005  . PACEMAKER LEAD REMOVAL N/A 10/17/2014   Procedure: PACEMAKER LEAD REMOVAL;  Surgeon: Evans Lance, MD;  Location: [facility];  Service: Cardiovascular;  Laterality: N/A;  . s/p ICD placement      Medtronic Maximo [phone] single chamber    Current Outpatient Prescriptions  Medication Sig Dispense Refill  . albuterol (PROVENTIL HFA;VENTOLIN HFA) 108 (90 BASE) MCG/ACT inhaler Inhale  2 puffs into the lungs 4 (four) times daily as needed for wheezing or shortness of breath. 1 Inhaler 1  . aspirin 81 MG tablet Take 81 mg by mouth daily.     Marland Kitchen atorvastatin (LIPITOR) 10 MG tablet Take 1 tablet (10 mg total) by mouth every evening. 90 tablet 3  . bisoprolol (ZEBETA) 5 MG tablet Take 0.5 tablets (2.5 mg total) by mouth daily. 45 tablet 3  . enalapril (VASOTEC) 20 MG tablet Take 0.5 tablets (10 mg total) by mouth daily. 45 tablet 3  . gemfibrozil (LOPID) 600 MG tablet Take 1 tablet (600 mg total) by mouth 2 (two) times daily before a meal. 180 tablet 3  .  isosorbide mononitrate (ISMO,MONOKET) 10 MG tablet Take 1 tablet (10 mg total) by mouth 2 (two) times daily. 90 tablet 3  . multivitamin (THERAGRAN) per tablet Take 1 tablet by mouth daily.     . vitamin B-12 (CYANOCOBALAMIN) 1000 MCG tablet Take 1,000 mcg by mouth daily.      No current facility-administered medications for this visit.     Allergies:   Review of patient's allergies indicates no known allergies.    Social History:  The patient  reports that he has been smoking Cigarettes.  He has a 30.00 pack-year smoking history. He has never used smokeless tobacco. He reports that he does not drink alcohol or use drugs.   Family History:  The patient's family history includes Cancer in his brother and sister; Coronary artery disease in his maternal aunt; Heart failure in his maternal aunt; Lung cancer in his maternal aunt and maternal uncle; Stroke in his father.    ROS:  Please see the history of present illness. All other systems are reviewed and negative.    PHYSICAL EXAM: VS:  BP (!) 152/88   Pulse 68   Ht 5\' 9"  (1.753 m)   Wt 267 lb 3.2 oz (121.2 kg)   BMI 39.46 kg/m  , BMI Body mass index is 39.46 kg/m. GEN: Well nourished, well developed, male in no acute distress  HEENT: normal for age  Neck: no JVD, faint left carotid bruit, no masses Cardiac: RRR; no murmur, no rubs, or gallops Respiratory:  Few scattered rales with decreased breath sounds bases bilaterally, normal work of breathing GI: soft, nontender, nondistended, + BS MS: no deformity or atrophy; no edema; distal pulses are 2+ in upper extremities; not palpated in both lower extremities, capillary refill is normal on the left, delayed on the right   Skin: warm and dry, no rash Neuro:  Strength and sensation are intact Psych: euthymic mood, full affect   EKG:  EKG is not ordered today.  CATH: 2012 cath demonstrating LAD a 60-70% stenosis, moderate size second diagonal with 50% stenosis, circumflex AV groove  75-80% stenosis with a branching obtuse marginal with a superior branch subtotal stenosis followed by diffuse disease, right coronary artery had nonobstructive disease.  EF was 35%.   He was managed medically  Carotid Dopplers, 12/2015 Right ICA 1-39 percent Left ICA 40-59 percent Left ECA greater than 50%  Recent Labs: 08/22/2016: ALT 22; BUN 13; Creatinine, Ser 0.81; Hemoglobin 15.8; Platelets 251.0; Potassium 4.9; Sodium 136    Lipid Panel    Component Value Date/Time   CHOL 218 (H) 11/29/2015 1056   TRIG 296.0 (H) 11/29/2015 1056   TRIG 575 (HH) 01/02/2007 0000   HDL 31.90 (L) 11/29/2015 1056   CHOLHDL 7 11/29/2015 1056   VLDL 59.2 (H) 11/29/2015 1056  [place] NOT CALC mg/dL 05/27/2008 2013   LDLDIRECT 73.0 11/29/2015 1056     Wt Readings from Last 3 Encounters:  09/17/16 267 lb 3.2 oz (121.2 kg)  08/27/16 264 lb 3.2 oz (119.8 kg)  08/22/16 265 lb (120.2 kg)     Other studies Reviewed: Additional studies/ records that were reviewed today include: Office notes, hospital records and testing.  ASSESSMENT AND PLAN:  1.  Dizziness: His symptoms improved with a loud his blood pressure to run a little higher. I discussed what I felt was a correlation between these 2 things. His carotid disease may also be playing a role. He understands the importance of getting his carotid Dopplers checked in one year as scheduled. Contact us for additional symptoms.  2. Hypertension: His home blood pressure chart is reviewed. I discussed goal what pressure 135/85 and how uncontrolled hypertension damages multiple body systems. We will add amlodipine 2.5 mg daily. He is to increase this to 5 mg daily if he is not having side effects and blood pressure is not at target.  3. CAD: No ongoing ischemic symptoms. Continue current therapy.   Current medicines are reviewed at length with the patient today.  The patient does not have concerns regarding medicines.  The following changes have been made:   Add amlodipine  Labs/ tests ordered today include:  No orders of the defined types were placed in this encounter.   Disposition:   FU with Dr. Percival Spanish  Signed, Rosaria Ferries, PA-C  09/17/2016 9:20 AM    [place] Phone: [phone]; Fax: [phone]  This note was written with the assistance of speech recognition software. Please excuse any transcriptional errors.

## 2016-09-17 NOTE — Patient Instructions (Signed)
Medications:  START Amlodipine 5 mg--take 1/2 tablet daily. If your blood pressure does not reach target of 135/85, start taking 1 tablet daily. If you start feeling dizzy, please call.   Testing:  You are due for your Carotid Dopplers in January 2018--please schedule.  Your physician has requested that you have a carotid duplex. This test is an ultrasound of the carotid arteries in your neck. It looks at blood flow through these arteries that supply the brain with blood. Allow one hour for this exam. There are no restrictions or special instructions.   Follow-Up:  Your physician recommends that you schedule a follow-up appointment in: January 2018.  If you need a refill on your cardiac medications before your next appointment, please call your pharmacy.

## 2016-11-25 ENCOUNTER — Ambulatory Visit (INDEPENDENT_AMBULATORY_CARE_PROVIDER_SITE_OTHER): Payer: BLUE CROSS/BLUE SHIELD | Admitting: Internal Medicine

## 2016-11-25 ENCOUNTER — Encounter: Payer: Self-pay | Admitting: Internal Medicine

## 2016-11-25 VITALS — BP 126/78 | HR 68 | Temp 98.2°F | Ht 69.0 in | Wt 271.5 lb

## 2016-11-25 DIAGNOSIS — Z23 Encounter for immunization: Secondary | ICD-10-CM

## 2016-11-25 DIAGNOSIS — H6120 Impacted cerumen, unspecified ear: Secondary | ICD-10-CM | POA: Insufficient documentation

## 2016-11-25 DIAGNOSIS — I6523 Occlusion and stenosis of bilateral carotid arteries: Secondary | ICD-10-CM

## 2016-11-25 DIAGNOSIS — H6121 Impacted cerumen, right ear: Secondary | ICD-10-CM | POA: Diagnosis not present

## 2016-11-25 NOTE — Progress Notes (Signed)
Subjective:    Patient ID: [name], male    DOB: 1956-07-10, 60 y.o.   MRN: [id number]  HPI Here due to ear wax  Hearing is off--went to be checked for hearing aides Hopes to use his mom's that she left when she died a month ago or so They said there that he was blocked up  No fever No cold symptoms No swimming or underwater time  Current Outpatient Prescriptions on File Prior to Visit  Medication Sig Dispense Refill  . albuterol (PROVENTIL HFA;VENTOLIN HFA) 108 (90 BASE) MCG/ACT inhaler Inhale 2 puffs into the lungs 4 (four) times daily as needed for wheezing or shortness of breath. 1 Inhaler 1  . amLODipine (NORVASC) 5 MG tablet Take 1/2 -1 tablet by mouth daily as directed. (09/17/16) 180 tablet 3  . aspirin 81 MG tablet Take 81 mg by mouth daily.     Marland Kitchen atorvastatin (LIPITOR) 10 MG tablet Take 1 tablet (10 mg total) by mouth every evening. 90 tablet 3  . bisoprolol (ZEBETA) 5 MG tablet Take 0.5 tablets (2.5 mg total) by mouth daily. 45 tablet 3  . enalapril (VASOTEC) 20 MG tablet Take 0.5 tablets (10 mg total) by mouth daily. 45 tablet 3  . gemfibrozil (LOPID) 600 MG tablet Take 1 tablet (600 mg total) by mouth 2 (two) times daily before a meal. 180 tablet 3  . isosorbide mononitrate (ISMO,MONOKET) 10 MG tablet Take 1 tablet (10 mg total) by mouth 2 (two) times daily. 90 tablet 3  . multivitamin (THERAGRAN) per tablet Take 1 tablet by mouth daily.     . vitamin B-12 (CYANOCOBALAMIN) 1000 MCG tablet Take 1,000 mcg by mouth daily.      No current facility-administered medications on file prior to visit.     No Known Allergies  Past Medical History:  Diagnosis Date  . Arthritis   . Automatic implantable cardioverter-defibrillator in situ   . Benign essential HTN 09/17/2016  . COPD (chronic obstructive pulmonary disease) ([place])    emphysema by CXR  . Coronary artery disease     LAD a 60-70% stenosis, moderate size second diagonal with 50% stenosis, circumflex AV groove  75-80% stenosis with a branching obtuse marginal with a superior branch subtotal stenosis followed by diffuse disease, right coronary artery  had nonobstructive disease.  EF was 35%  . Diabetes mellitus    type 2  . Diverticulosis of colon   . GERD (gastroesophageal reflux disease)    with esophagitis  . History of colonic polyps   . Hypertension   . Non-ischemic cardiomyopathy ([place])   . PVD (peripheral vascular disease) ([place])    bilateral common iliac artery aneurysms. Right SFA occlusion over a long segment. Left SFA disease with occlusion of the left TP trunk. Artirogram Oct. 2006  . Shortness of breath    exertion  . Sleep apnea   . Tobacco abuse   . Urinary incontinence    detrussor instability    Past Surgical History:  Procedure Laterality Date  . CARDIAC CATHETERIZATION  06/2004   negative  . COLONOSCOPY  04/2005  . COPD exacerbation    . PACEMAKER INSERTION  11/2005  . PACEMAKER LEAD REMOVAL N/A 10/17/2014   Procedure: PACEMAKER LEAD REMOVAL;  Surgeon: Evans Lance, MD;  Location: [facility] OR;  Service: Cardiovascular;  Laterality: N/A;  . s/p ICD placement      Medtronic Maximo [phone] single chamber    Family History  Problem Relation Age of Onset  .  Stroke Father   . Coronary artery disease Maternal Aunt   . Heart failure Maternal Aunt   . Lung cancer Maternal Aunt   . Lung cancer Maternal Uncle   . Cancer Brother     Mouth  . Cancer Sister     throat    Social History   Social History  . Marital status: Married    Spouse name: N/A  . Number of children: 2  . Years of education: N/A   Occupational History  . Grave digger--now disabled    Social History Main Topics  . Smoking status: Current Every Day Smoker    Packs/day: 2.00    Years: 15.00    Types: Cigarettes  . Smokeless tobacco: Never Used     Comment: GAVE 1-800-QUIT-NOW  . Alcohol use No  . Drug use: No  . Sexual activity: No   Other Topics Concern  . Not on file   Social History  Narrative   No living will   Requests wife as health care POA   Would accept resuscitation but doesn't want prolonged ventilation   No tube feeds if cognitively unaware   Review of Systems  Constant right ear tinnitus--no change Rare headache     Objective:   Physical Exam  Constitutional: He appears well-developed and well-nourished. No distress.  HENT:  Cerumen only ~25% on left >75% on right           Assessment & Plan:

## 2016-11-25 NOTE — Assessment & Plan Note (Signed)
Will clear both so canals are completely clear for upcoming tests to adjust mom's hearing aides for him Lavage done

## 2016-11-25 NOTE — Progress Notes (Signed)
Pre visit review using our clinic review tool, if applicable. No additional management support is needed unless otherwise documented below in the visit note. 

## 2016-11-25 NOTE — Addendum Note (Signed)
Addended by: Carter Kitten on: 11/25/2016 03:06 PM   Modules accepted: Orders

## 2016-12-11 ENCOUNTER — Telehealth: Payer: Self-pay

## 2016-12-11 ENCOUNTER — Encounter: Payer: Self-pay | Admitting: Internal Medicine

## 2016-12-11 ENCOUNTER — Ambulatory Visit (INDEPENDENT_AMBULATORY_CARE_PROVIDER_SITE_OTHER): Payer: BLUE CROSS/BLUE SHIELD | Admitting: Internal Medicine

## 2016-12-11 VITALS — BP 130/84 | HR 65 | Temp 97.9°F | Ht 69.75 in | Wt 270.0 lb

## 2016-12-11 DIAGNOSIS — E1151 Type 2 diabetes mellitus with diabetic peripheral angiopathy without gangrene: Secondary | ICD-10-CM

## 2016-12-11 DIAGNOSIS — E1142 Type 2 diabetes mellitus with diabetic polyneuropathy: Secondary | ICD-10-CM

## 2016-12-11 DIAGNOSIS — I5022 Chronic systolic (congestive) heart failure: Secondary | ICD-10-CM | POA: Diagnosis not present

## 2016-12-11 DIAGNOSIS — F39 Unspecified mood [affective] disorder: Secondary | ICD-10-CM

## 2016-12-11 DIAGNOSIS — J439 Emphysema, unspecified: Secondary | ICD-10-CM | POA: Diagnosis not present

## 2016-12-11 DIAGNOSIS — Z0000 Encounter for general adult medical examination without abnormal findings: Secondary | ICD-10-CM

## 2016-12-11 LAB — COMPREHENSIVE METABOLIC PANEL
ALT: 29 U/L (ref 0–53)
AST: 36 U/L (ref 0–37)
Albumin: 4.7 g/dL (ref 3.5–5.2)
Alkaline Phosphatase: 84 U/L (ref 39–117)
BUN: 9 mg/dL (ref 6–23)
CALCIUM: 9.6 mg/dL (ref 8.4–10.5)
CHLORIDE: 101 meq/L (ref 96–112)
CO2: 26 meq/L (ref 19–32)
Creatinine, Ser: 0.78 mg/dL (ref 0.40–1.50)
GFR: 107.89 mL/min (ref >60.00)
GLUCOSE: 100 mg/dL — AB (ref 70–99)
Potassium: 4.1 mEq/L (ref 3.5–5.1)
Sodium: 138 mEq/L (ref 135–145)
Total Bilirubin: 0.5 mg/dL (ref 0.2–1.2)
Total Protein: 7.7 g/dL (ref 6.0–8.3)

## 2016-12-11 LAB — CBC WITH DIFFERENTIAL/PLATELET
BASOS PCT: 0.6 % (ref 0.0–3.0)
Basophils Absolute: 0.1 10*3/uL (ref 0.0–0.1)
EOS ABS: 0.1 10*3/uL (ref 0.0–0.7)
Eosinophils Relative: 1.5 % (ref 0.0–5.0)
HEMATOCRIT: 46.9 % (ref 39.0–52.0)
Hemoglobin: 16.4 g/dL (ref 13.0–17.0)
LYMPHS ABS: 3.6 10*3/uL (ref 0.7–4.0)
LYMPHS PCT: 37.4 % (ref 12.0–46.0)
MCHC: 35 g/dL (ref 30.0–36.0)
MCV: 83.9 fl (ref 78.0–100.0)
MONOS PCT: 5.7 % (ref 3.0–12.0)
Monocytes Absolute: 0.6 10*3/uL (ref 0.1–1.0)
NEUTROS ABS: 5.3 10*3/uL (ref 1.4–7.7)
NEUTROS PCT: 54.8 % (ref 43.0–77.0)
PLATELETS: 269 10*3/uL (ref 150.0–400.0)
RBC: 5.59 Mil/uL (ref 4.22–5.81)
RDW: 13.9 % (ref 11.5–15.5)
WBC: 9.6 10*3/uL (ref 4.0–10.5)

## 2016-12-11 LAB — MICROALBUMIN / CREATININE URINE RATIO
Creatinine,U: 69.1 mg/dL
MICROALB/CREAT RATIO: 0.9 mg/g (ref 0.0–30.0)
Microalb, Ur: 0.6 mg/dL (ref 0.0–1.9)

## 2016-12-11 LAB — LIPID PANEL
CHOL/HDL RATIO: 8
CHOLESTEROL: 269 mg/dL — AB (ref 0–200)
HDL: 34.9 mg/dL — AB (ref >39.00)
Triglycerides: 422 mg/dL — ABNORMAL HIGH (ref 0.0–149.0)

## 2016-12-11 LAB — T4, FREE: Free T4: 0.76 ng/dL (ref 0.60–1.60)

## 2016-12-11 LAB — HEMOGLOBIN A1C: HEMOGLOBIN A1C: 6.4 % (ref 4.6–6.5)

## 2016-12-11 LAB — LDL CHOLESTEROL, DIRECT: Direct LDL: 73 mg/dL

## 2016-12-11 MED ORDER — TETANUS-DIPHTHERIA TOXOIDS TD 5-2 LFU IM INJ: 0.5000 mL | INJECTION | Freq: Once | INTRAMUSCULAR | 0 refills | Status: AC

## 2016-12-11 NOTE — Assessment & Plan Note (Signed)
I have personally reviewed the Medicare Annual Wellness questionnaire and have noted 1. The patient's medical and social history 2. Their use of alcohol, tobacco or illicit drugs 3. Their current medications and supplements 4. The patient's functional ability including ADL's, fall risks, home safety risks and hearing or visual             impairment. 5. Diet and physical activities 6. Evidence for depression or mood disorders  The patients weight, height, BMI and visual acuity have been recorded in the chart I have made referrals, counseling and provided education to the patient based review of the above and I have provided the pt with a written personalized care plan for preventive services.  I have provided you with a copy of your personalized plan for preventive services. Please take the time to review along with your updated medication list.  Rx for Td No cancer screening by his choice Rx for audiologic evaluation given

## 2016-12-11 NOTE — Progress Notes (Signed)
Subjective:    Patient ID: [name], male    DOB: Jun 25, 1956, 60 y.o.   MRN: [id number]  HPI Here for Medicare wellness visit and follow up of chronic health conditions Reviewed form and advanced directives Reviewed other doctors Hearing is poor--looking into aides (Rx written for assessment) Vision okay. Due for repeat exam. [facility] is [facility] practice No alcohol Still a big smoker and not willing to stop Has had several falls--but no injury Chronic mood issues-- dysthymia and is more restricted. Some crying but not anhedonic  Having dizzy spells Cardiologist stopped the enalapril---feels better now Gets tired quickly still Leg pain if he pushes it  Some chest pain if he pushes it. Occasionally at rest No NTG--resolves on its own Breathing okay at rest---easy DOE and some decline in function (no longer can garden) No edema No weighing himself (asked him to restart)  Regular cough Wheezes when he is active Hasn't been using the inhaler  Not checking sugars  Current Outpatient Prescriptions on File Prior to Visit  Medication Sig Dispense Refill  . albuterol (PROVENTIL HFA;VENTOLIN HFA) 108 (90 BASE) MCG/ACT inhaler Inhale 2 puffs into the lungs 4 (four) times daily as needed for wheezing or shortness of breath. 1 Inhaler 1  . amLODipine (NORVASC) 5 MG tablet Take 1/2 -1 tablet by mouth daily as directed. (09/17/16) 180 tablet 3  . aspirin 81 MG tablet Take 81 mg by mouth daily.     . bisoprolol (ZEBETA) 5 MG tablet Take 0.5 tablets (2.5 mg total) by mouth daily. 45 tablet 3  . gemfibrozil (LOPID) 600 MG tablet Take 1 tablet (600 mg total) by mouth 2 (two) times daily before a meal. 180 tablet 3  . isosorbide mononitrate (ISMO,MONOKET) 10 MG tablet Take 1 tablet (10 mg total) by mouth 2 (two) times daily. 90 tablet 3  . multivitamin (THERAGRAN) per tablet Take 1 tablet by mouth daily.     . vitamin B-12 (CYANOCOBALAMIN) 1000 MCG tablet Take 1,000 mcg by mouth daily.      Marland Kitchen atorvastatin (LIPITOR) 10 MG tablet Take 1 tablet (10 mg total) by mouth every evening. 90 tablet 3   No current facility-administered medications on file prior to visit.     No Known Allergies  Past Medical History:  Diagnosis Date  . Arthritis   . Automatic implantable cardioverter-defibrillator in situ   . Benign essential HTN 09/17/2016  . COPD (chronic obstructive pulmonary disease) ([place])    emphysema by CXR  . Coronary artery disease     LAD a 60-70% stenosis, moderate size second diagonal with 50% stenosis, circumflex AV groove 75-80% stenosis with a branching obtuse marginal with a superior branch subtotal stenosis followed by diffuse disease, right coronary artery  had nonobstructive disease.  EF was 35%  . Diabetes mellitus    type 2  . Diverticulosis of colon   . GERD (gastroesophageal reflux disease)    with esophagitis  . History of colonic polyps   . Hypertension   . Non-ischemic cardiomyopathy ([place])   . PVD (peripheral vascular disease) ([place])    bilateral common iliac artery aneurysms. Right SFA occlusion over a long segment. Left SFA disease with occlusion of the left TP trunk. Artirogram Oct. 2006  . Shortness of breath    exertion  . Sleep apnea   . Tobacco abuse   . Urinary incontinence    detrussor instability    Past Surgical History:  Procedure Laterality Date  . CARDIAC CATHETERIZATION  06/2004   negative  . COLONOSCOPY  04/2005  . COPD exacerbation    . PACEMAKER INSERTION  11/2005  . PACEMAKER LEAD REMOVAL N/A 10/17/2014   Procedure: PACEMAKER LEAD REMOVAL;  Surgeon: Evans Lance, MD;  Location: [facility] OR;  Service: Cardiovascular;  Laterality: N/A;  . s/p ICD placement      Medtronic Maximo [phone] single chamber    Family History  Problem Relation Age of Onset  . Stroke Father   . Coronary artery disease Maternal Aunt   . Heart failure Maternal Aunt   . Lung cancer Maternal Aunt   . Lung cancer Maternal Uncle   . Cancer Brother      Mouth  . Cancer Sister     throat    Social History   Social History  . Marital status: Married    Spouse name: N/A  . Number of children: 2  . Years of education: N/A   Occupational History  . Grave digger--now disabled    Social History Main Topics  . Smoking status: Current Every Day Smoker    Packs/day: 2.00    Years: 15.00    Types: Cigarettes  . Smokeless tobacco: Never Used     Comment: GAVE 1-800-QUIT-NOW  . Alcohol use No  . Drug use: No  . Sexual activity: No   Other Topics Concern  . Not on file   Social History Narrative   No living will   Requests wife as health care POA   Would accept resuscitation but doesn't want prolonged ventilation   No tube feeds if cognitively unaware   Review of Systems Sleeps a lot at times--but doesn't think this is an issue. 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 No rashes or suspicious lesions Bowels are okay. No blood Urinary urgency--has incontinence at times No sig back pain. Chronic joint pain--no meds for this    Objective:   Physical Exam  Constitutional: He is oriented to person, place, and time. He appears well-nourished. No distress.  HENT:  Mouth/Throat: Oropharynx is clear and moist. No oropharyngeal exudate.  edentulous  Neck: No thyromegaly present.  Cardiovascular: Normal rate, regular rhythm and normal heart sounds.  Exam reveals no gallop.   No murmur heard. Feet warm without pulses  Pulmonary/Chest: He has no rales.  Decreased breath sounds Scattered rhonchi/wheeze--but not tight  Abdominal: Soft. There is no tenderness.  Musculoskeletal: He exhibits no edema or tenderness.  Lymphadenopathy:    He has no cervical adenopathy.  Neurological: He is alert and oriented to person, place, and time.  President-- "Daisy Floro, Obama, ?" [phone] [id number] Recall 2/3  Decreased sensation in feet  Skin:  Slight plantar  callous No foot ulcers  Psychiatric: He has a normal mood and affect. His behavior is normal.          Assessment & Plan:

## 2016-12-11 NOTE — Assessment & Plan Note (Signed)
Hopefully still good control Will check labs 

## 2016-12-11 NOTE — Assessment & Plan Note (Signed)
Seems euvolemic Asked him to check weights Off ACEI due to dizziness

## 2016-12-11 NOTE — Assessment & Plan Note (Signed)
Stable claudication pattern

## 2016-12-11 NOTE — Progress Notes (Signed)
Pre visit review using our clinic review tool, if applicable. No additional management support is needed unless otherwise documented below in the visit note. 

## 2016-12-11 NOTE — Assessment & Plan Note (Signed)
No sig pain--so no Rx

## 2016-12-11 NOTE — Assessment & Plan Note (Signed)
Chronic dysthymia without MDD No meds

## 2016-12-11 NOTE — Telephone Encounter (Signed)
Amy at [facility] left v/m wanting to know if could change tenivac to adacel so can order individual injection. Amy request cb.

## 2016-12-11 NOTE — Assessment & Plan Note (Signed)
Not willing to stop smoking Discussed trying albuterol before exertion

## 2016-12-12 NOTE — Telephone Encounter (Signed)
Spoke to [employer] at [facility].

## 2016-12-12 NOTE — Telephone Encounter (Signed)
That is fine 

## 2016-12-24 ENCOUNTER — Other Ambulatory Visit: Payer: Self-pay | Admitting: Internal Medicine

## 2016-12-24 DIAGNOSIS — I6523 Occlusion and stenosis of bilateral carotid arteries: Secondary | ICD-10-CM

## 2017-01-08 ENCOUNTER — Inpatient Hospital Stay (HOSPITAL_COMMUNITY): Admission: RE | Admit: 2017-01-08 | Payer: BLUE CROSS/BLUE SHIELD | Source: Ambulatory Visit

## 2017-01-17 ENCOUNTER — Encounter (HOSPITAL_COMMUNITY): Payer: Self-pay | Admitting: Internal Medicine

## 2017-04-14 ENCOUNTER — Encounter: Payer: Self-pay | Admitting: Internal Medicine

## 2017-04-14 ENCOUNTER — Ambulatory Visit (INDEPENDENT_AMBULATORY_CARE_PROVIDER_SITE_OTHER): Payer: Medicare Other | Admitting: Internal Medicine

## 2017-04-14 VITALS — BP 134/80 | HR 80 | Temp 98.1°F | Wt 274.5 lb

## 2017-04-14 DIAGNOSIS — M545 Low back pain, unspecified: Secondary | ICD-10-CM

## 2017-04-14 DIAGNOSIS — I6523 Occlusion and stenosis of bilateral carotid arteries: Secondary | ICD-10-CM | POA: Diagnosis not present

## 2017-04-14 DIAGNOSIS — G8929 Other chronic pain: Secondary | ICD-10-CM | POA: Insufficient documentation

## 2017-04-14 MED ORDER — METHOCARBAMOL 500 MG PO TABS: 500.0000 mg | ORAL_TABLET | Freq: Three times a day (TID) | ORAL | 1 refills | Status: DC | PRN

## 2017-04-14 NOTE — Patient Instructions (Signed)
Please don't drive after the muscle relaxer till you find out how your respond to it. If you are not any better later in the week, I will have you see sports medicine or an orthopedist.

## 2017-04-14 NOTE — Assessment & Plan Note (Signed)
Seems muscular Legs give out but are not really weak No disc signs Having some tightness in arms also ---will check electrolytes Try muscle relaxer PT eval Consider ortho or sports medicine if no better later this week

## 2017-04-14 NOTE — Progress Notes (Signed)
Subjective:    Patient ID: [name], male    DOB: 07/14/1956, 61 y.o.   MRN: [id number]  HPI Here due to back pain  Low back pain started about 2 weeks ago Got worse with any activity Progressively worsening, then really worse in past 2 days Unable to get up Has been falling down--using crutches Without the crutches, bad pain and mostly left leg will "give out" Occasionally right leg gives out  Has noted urinary frequency and urgency Now has incontinence (started before the back pain) No hematuria  No injury No previous back pain Pain across low lumbar back---the into posterior hip but not down leg  Tylenol and aleve not helping--- adequate doses  Current Outpatient Prescriptions on File Prior to Visit  Medication Sig Dispense Refill  . albuterol (PROVENTIL HFA;VENTOLIN HFA) 108 (90 BASE) MCG/ACT inhaler Inhale 2 puffs into the lungs 4 (four) times daily as needed for wheezing or shortness of breath. 1 Inhaler 1  . amLODipine (NORVASC) 5 MG tablet Take 1/2 -1 tablet by mouth daily as directed. (09/17/16) 180 tablet 3  . aspirin 81 MG tablet Take 81 mg by mouth daily.     Marland Kitchen atorvastatin (LIPITOR) 10 MG tablet Take 1 tablet (10 mg total) by mouth every evening. 90 tablet 3  . bisoprolol (ZEBETA) 5 MG tablet Take 0.5 tablets (2.5 mg total) by mouth daily. 45 tablet 3  . gemfibrozil (LOPID) 600 MG tablet Take 1 tablet (600 mg total) by mouth 2 (two) times daily before a meal. 180 tablet 3  . isosorbide mononitrate (ISMO,MONOKET) 10 MG tablet Take 1 tablet (10 mg total) by mouth 2 (two) times daily. 90 tablet 3  . multivitamin (THERAGRAN) per tablet Take 1 tablet by mouth daily.     . vitamin B-12 (CYANOCOBALAMIN) 1000 MCG tablet Take 1,000 mcg by mouth daily.      No current facility-administered medications on file prior to visit.     No Known Allergies  Past Medical History:  Diagnosis Date  . Arthritis   . Automatic implantable cardioverter-defibrillator in situ     . Benign essential HTN 09/17/2016  . COPD (chronic obstructive pulmonary disease) ([place])    emphysema by CXR  . Coronary artery disease     LAD a 60-70% stenosis, moderate size second diagonal with 50% stenosis, circumflex AV groove 75-80% stenosis with a branching obtuse marginal with a superior branch subtotal stenosis followed by diffuse disease, right coronary artery  had nonobstructive disease.  EF was 35%  . Diabetes mellitus    type 2  . Diverticulosis of colon   . GERD (gastroesophageal reflux disease)    with esophagitis  . History of colonic polyps   . Hypertension   . Non-ischemic cardiomyopathy ([place])   . PVD (peripheral vascular disease) ([place])    bilateral common iliac artery aneurysms. Right SFA occlusion over a long segment. Left SFA disease with occlusion of the left TP trunk. Artirogram Oct. 2006  . Shortness of breath    exertion  . Sleep apnea   . Tobacco abuse   . Urinary incontinence    detrussor instability    Past Surgical History:  Procedure Laterality Date  . CARDIAC CATHETERIZATION  06/2004   negative  . COLONOSCOPY  04/2005  . COPD exacerbation    . PACEMAKER INSERTION  11/2005  . PACEMAKER LEAD REMOVAL N/A 10/17/2014   Procedure: PACEMAKER LEAD REMOVAL;  Surgeon: Evans Lance, MD;  Location: [facility];  Service: Cardiovascular;  Laterality: N/A;  . s/p ICD placement      Medtronic Maximo [phone] single chamber    Family History  Problem Relation Age of Onset  . Stroke Father   . Coronary artery disease Maternal Aunt   . Heart failure Maternal Aunt   . Lung cancer Maternal Aunt   . Lung cancer Maternal Uncle   . Cancer Brother     Mouth  . Cancer Sister     throat    Social History   Social History  . Marital status: Married    Spouse name: N/A  . Number of children: 2  . Years of education: N/A   Occupational History  . Grave digger--now disabled    Social History Main Topics  . Smoking status: Current Every Day Smoker    Packs/day:  2.00    Years: 15.00    Types: Cigarettes  . Smokeless tobacco: Never Used     Comment: GAVE 1-800-QUIT-NOW  . Alcohol use No  . Drug use: No  . Sexual activity: No   Other Topics Concern  . Not on file   Social History Narrative   No living will   Requests wife as health care POA   Would accept resuscitation but doesn't want prolonged ventilation   No tube feeds if cognitively unaware   Review of Systems  No fever Eating okay No burning dysuria Hands and elbows tightening up also-- more in evening     Objective:   Physical Exam  Constitutional: No distress.  Cardiovascular: Normal rate, regular rhythm and normal heart sounds.  Exam reveals no gallop.   No murmur heard. Pulmonary/Chest: Effort normal and breath sounds normal. No respiratory distress. He has no wheezes. He has no rales.  Musculoskeletal:  No CVA tenderness Low back tenderness bilaterally (muscle areas). No spine tenderness ROM in hips is fairly normal without pain SLR negative bilaterally    Neurological:  Limps but no specific leg weakness          Assessment & Plan:

## 2017-04-14 NOTE — Progress Notes (Signed)
Pre visit review using our clinic review tool, if applicable. No additional management support is needed unless otherwise documented below in the visit note. 

## 2017-04-15 DIAGNOSIS — M545 Low back pain: Secondary | ICD-10-CM | POA: Diagnosis not present

## 2017-04-15 LAB — RENAL FUNCTION PANEL
Albumin: 4.4 g/dL (ref 3.5–5.2)
BUN: 12 mg/dL (ref 6–23)
CO2: 27 mEq/L (ref 19–32)
Calcium: 9.4 mg/dL (ref 8.4–10.5)
Chloride: 104 mEq/L (ref 96–112)
Creatinine, Ser: 0.82 mg/dL (ref 0.40–1.50)
GFR: 101.72 mL/min (ref >60.00)
GLUCOSE: 93 mg/dL (ref 70–99)
POTASSIUM: 4.5 meq/L (ref 3.5–5.1)
Phosphorus: 2.9 mg/dL (ref 2.3–4.6)
SODIUM: 137 meq/L (ref 135–145)

## 2017-04-17 DIAGNOSIS — M545 Low back pain: Secondary | ICD-10-CM | POA: Diagnosis not present

## 2017-04-27 NOTE — Progress Notes (Signed)
HPI The patient presents for follow up of CAD.   In 2012 he had a stress test indicating anterior ischemia mild but from base to apex.  I sent him for a cath demonstrating LAD a 60-70% stenosis, moderate size second diagonal with 50% stenosis, circumflex AV groove 75-80% stenosis with a branching obtuse marginal with a superior branch subtotal stenosis followed by diffuse disease, right coronary artery had nonobstructive disease.  EF was 35%.   He was managed medically.  He has had an ICD.  However, when he had ERI Dr. Lovena Le elected not to replace the device. He had a recall lead and it was removed.  He was last seen in Sept.  He had some dizziness.  He did not tolerate Vasotec and isosorbide was reduced as well as the beta blocker dose.  He also was thought to be depressed.  He returns for follow up.    The patient denies any new symptoms such as chest discomfort, neck or arm discomfort. There has been no new shortness of breath, PND or orthopnea. There have been no reported palpitations, presyncope or syncope.  He is very sedentary and currently is walking with a walker because of back pain.    He still smokes.  He did not have his meds for a couple of days and thinks that that is why his BP is elevated.   No Known Allergies  Current Outpatient Prescriptions  Medication Sig Dispense Refill  . aspirin 81 MG tablet Take 81 mg by mouth daily.     . bisoprolol (ZEBETA) 5 MG tablet Take 0.5 tablets (2.5 mg total) by mouth daily. 45 tablet 3  . enalapril (VASOTEC) 2.5 MG tablet Take 1 tablet (2.5 mg total) by mouth 2 (two) times daily. 180 tablet 3  . isosorbide mononitrate (ISMO,MONOKET) 10 MG tablet Take 1 tablet (10 mg total) by mouth 2 (two) times daily. 90 tablet 3  . methocarbamol (ROBAXIN) 500 MG tablet Take 500 mg by mouth 3 (three) times daily.    . multivitamin (THERAGRAN) per tablet Take 1 tablet by mouth daily.     . vitamin B-12 (CYANOCOBALAMIN) 1000 MCG tablet Take 1,000 mcg by mouth  daily.      No current facility-administered medications for this visit.     Past Medical History:  Diagnosis Date  . Arthritis   . Automatic implantable cardioverter-defibrillator in situ   . Benign essential HTN 09/17/2016  . COPD (chronic obstructive pulmonary disease) ([place])    emphysema by CXR  . Coronary artery disease     LAD a 60-70% stenosis, moderate size second diagonal with 50% stenosis, circumflex AV groove 75-80% stenosis with a branching obtuse marginal with a superior branch subtotal stenosis followed by diffuse disease, right coronary artery  had nonobstructive disease.  EF was 35%  . Diabetes mellitus    type 2  . Diverticulosis of colon   . GERD (gastroesophageal reflux disease)    with esophagitis  . History of colonic polyps   . Hypertension   . Non-ischemic cardiomyopathy ([place])   . PVD (peripheral vascular disease) ([place])    bilateral common iliac artery aneurysms. Right SFA occlusion over a long segment. Left SFA disease with occlusion of the left TP trunk. Artirogram Oct. 2006  . Shortness of breath    exertion  . Sleep apnea   . Tobacco abuse   . Urinary incontinence    detrussor instability    Past Surgical History:  Procedure Laterality Date  .  CARDIAC CATHETERIZATION  06/2004   negative  . COLONOSCOPY  04/2005  . COPD exacerbation    . PACEMAKER INSERTION  11/2005  . PACEMAKER LEAD REMOVAL N/A 10/17/2014   Procedure: PACEMAKER LEAD REMOVAL;  Surgeon: Evans Lance, MD;  Location: [facility] OR;  Service: Cardiovascular;  Laterality: N/A;  . s/p ICD placement      Medtronic Maximo [phone] single chamber    ROS:   As stated in the HPI and negative for all other systems.  PHYSICAL EXAM BP (!) 162/88   Pulse 68   Ht 5\' 11"  (1.803 m)   Wt 272 lb (123.4 kg)   BMI 37.94 kg/m  GENERAL:  Well appearing, but walking slowly.  In no distress NECK:  No jugular venous distention, waveform within normal limits, carotid upstroke brisk and symmetric, no bruits,  no thyromegaly LUNGS:  Clear to auscultation bilaterally BACK:  No CVA tenderness CHEST: ICD scar well healed HEART:  PMI not displaced or sustained,S1 and S2 within normal limits, no S3, no S4, no clicks, no rubs, no murmurs ABD:  Flat, positive bowel sounds normal in frequency in pitch, no bruits, no rebound, no guarding, no midline pulsatile mass, no hepatomegaly, no splenomegaly, obese EXT:  2 plus pulses upper, absent DP/PT, trace edema, no cyanosis no clubbing, right wrist OK NEURO:  Flat affect.   EKG:  Sinus rhythm, rate 68, axis within normal limits, intervals within normal limits, no acute ST-T wave changes  04/28/2017    Lab Results  Component Value Date   CHOL 269 (H) 12/11/2016   TRIG (H) 12/11/2016    422.0 Triglyceride is over 400; calculations on Lipids are invalid.   HDL 34.90 (L) 12/11/2016   LDLCALC NOT CALC mg/dL 05/27/2008   LDLDIRECT 73.0 12/11/2016     ASSESSMENT AND PLAN  CAD:   The patient has  no new sypmtoms.  No further cardiovascular testing is indicated.    HTN:  This is being managed in the context of treating his CHF  We are letting his BP run slightly higher because he feels dizzy at lower BPs.  This will limit med titration.   CAROTID STENOSIS:  He had 60 - 79% on the left.  He was scheduled for Doppler in Jan but our office was closed.  I will reschedule this.   TOBACCO ABUSE:  He will not quit smoking and we talked about this today (again).   He sits on his porch and smokes  CARDIOMYOPATHY/CHRONIC SYSTOLIC HF:  He seems to be euvolemic.  His EF was 35% in 2015.  I would like to order another echo.  He was taken off of his ACE inhibitor secondary to dizziness.  I would rather he was on the ACE inhibitors so I will restart Vasotec and stop amlodipine.    DYSLIPIDEMIA:  I reviewed his last LDL which was 73 in Dec. He will continue the meds as listed.  DEPRESSED AFFECT: He seems brighter today.

## 2017-04-28 ENCOUNTER — Ambulatory Visit (INDEPENDENT_AMBULATORY_CARE_PROVIDER_SITE_OTHER): Payer: Medicare Other | Admitting: Cardiology

## 2017-04-28 ENCOUNTER — Encounter: Payer: Self-pay | Admitting: Cardiology

## 2017-04-28 VITALS — BP 162/88 | HR 68 | Ht 71.0 in | Wt 272.0 lb

## 2017-04-28 DIAGNOSIS — I6523 Occlusion and stenosis of bilateral carotid arteries: Secondary | ICD-10-CM

## 2017-04-28 DIAGNOSIS — I2510 Atherosclerotic heart disease of native coronary artery without angina pectoris: Secondary | ICD-10-CM | POA: Diagnosis not present

## 2017-04-28 DIAGNOSIS — E785 Hyperlipidemia, unspecified: Secondary | ICD-10-CM | POA: Diagnosis not present

## 2017-04-28 DIAGNOSIS — I10 Essential (primary) hypertension: Secondary | ICD-10-CM | POA: Diagnosis not present

## 2017-04-28 DIAGNOSIS — I5042 Chronic combined systolic (congestive) and diastolic (congestive) heart failure: Secondary | ICD-10-CM

## 2017-04-28 MED ORDER — ENALAPRIL MALEATE 2.5 MG PO TABS: 2.5000 mg | ORAL_TABLET | Freq: Two times a day (BID) | ORAL | 3 refills | Status: DC

## 2017-04-28 MED ORDER — BISOPROLOL FUMARATE 5 MG PO TABS: 2.5000 mg | ORAL_TABLET | Freq: Every day | ORAL | 3 refills | Status: DC

## 2017-04-28 MED ORDER — ENALAPRIL MALEATE 2.5 MG PO TABS: 2.5000 mg | ORAL_TABLET | Freq: Every day | ORAL | 3 refills | Status: DC

## 2017-04-28 NOTE — Patient Instructions (Signed)
Medication Instructions:  STOP- Amlodipine START- Enalapril 2.5 mg twice a day  Labwork: None Ordered  Testing/Procedures: Your physician has requested that you have a carotid duplex. This test is an ultrasound of the carotid arteries in your neck. It looks at blood flow through these arteries that supply the brain with blood. Allow one hour for this exam. There are no restrictions or special instructions.  Your physician has requested that you have an echocardiogram. Echocardiography is a painless test that uses sound waves to create images of your heart. It provides your doctor with information about the size and shape of your heart and how well your heart's chambers and valves are working. This procedure takes approximately one hour. There are no restrictions for this procedure.   Follow-Up: Your physician wants you to follow-up in: 6 Months. You will receive a reminder letter in the mail two months in advance. If you don't receive a letter, please call our office to schedule the follow-up appointment.   Any Other Special Instructions Will Be Listed Below (If Applicable).   If you need a refill on your cardiac medications before your next appointment, please call your pharmacy.

## 2017-05-09 ENCOUNTER — Other Ambulatory Visit: Payer: Self-pay

## 2017-05-09 ENCOUNTER — Ambulatory Visit (HOSPITAL_COMMUNITY): Payer: BLUE CROSS/BLUE SHIELD | Attending: Cardiology

## 2017-05-09 DIAGNOSIS — E119 Type 2 diabetes mellitus without complications: Secondary | ICD-10-CM | POA: Diagnosis not present

## 2017-05-09 DIAGNOSIS — I5042 Chronic combined systolic (congestive) and diastolic (congestive) heart failure: Secondary | ICD-10-CM | POA: Diagnosis not present

## 2017-05-09 DIAGNOSIS — Z6838 Body mass index (BMI) 38.0-38.9, adult: Secondary | ICD-10-CM | POA: Diagnosis not present

## 2017-05-09 DIAGNOSIS — E785 Hyperlipidemia, unspecified: Secondary | ICD-10-CM | POA: Insufficient documentation

## 2017-05-09 DIAGNOSIS — I110 Hypertensive heart disease with heart failure: Secondary | ICD-10-CM | POA: Insufficient documentation

## 2017-05-09 DIAGNOSIS — J449 Chronic obstructive pulmonary disease, unspecified: Secondary | ICD-10-CM | POA: Diagnosis not present

## 2017-05-09 DIAGNOSIS — I428 Other cardiomyopathies: Secondary | ICD-10-CM | POA: Diagnosis not present

## 2017-05-09 DIAGNOSIS — I2510 Atherosclerotic heart disease of native coronary artery without angina pectoris: Secondary | ICD-10-CM | POA: Insufficient documentation

## 2017-05-14 ENCOUNTER — Ambulatory Visit (HOSPITAL_COMMUNITY)
Admission: RE | Admit: 2017-05-14 | Discharge: 2017-05-14 | Disposition: A | Discharge status: Home / Self Care | Payer: BLUE CROSS/BLUE SHIELD | Source: Ambulatory Visit | Attending: Cardiology | Admitting: Cardiology

## 2017-05-14 DIAGNOSIS — I6523 Occlusion and stenosis of bilateral carotid arteries: Secondary | ICD-10-CM | POA: Diagnosis not present

## 2017-05-22 ENCOUNTER — Telehealth: Payer: Self-pay | Admitting: *Deleted

## 2017-05-22 DIAGNOSIS — I6523 Occlusion and stenosis of bilateral carotid arteries: Secondary | ICD-10-CM

## 2017-05-22 NOTE — Telephone Encounter (Signed)
-----   Message from Minus Breeding, MD sent at 05/17/2017  1:15 PM EDT ----- Stable, 1-39% RICA stenosis. Stable, 14-99% LICA stenosis.   Follow up in one year.   Call Mr. Domagalski with the results and send results to Venia Carbon, MD

## 2017-05-22 NOTE — Telephone Encounter (Signed)
Carotid doppler ordered for 1 year 

## 2017-07-29 ENCOUNTER — Other Ambulatory Visit: Payer: Self-pay | Admitting: Internal Medicine

## 2017-07-31 ENCOUNTER — Other Ambulatory Visit: Payer: Self-pay | Admitting: Cardiology

## 2017-07-31 MED ORDER — ENALAPRIL MALEATE 2.5 MG PO TABS: 2.5000 mg | ORAL_TABLET | Freq: Two times a day (BID) | ORAL | 0 refills | Status: DC

## 2017-07-31 NOTE — Telephone Encounter (Signed)
Spoke to pt. He said his wife does his medication box and he is not sure if he is on the medication. He will have his wife call me.

## 2017-07-31 NOTE — Telephone Encounter (Signed)
Caller Name:Theresa Nyra Capes Relationship to [facility]:  Reason for call: returning your call

## 2017-07-31 NOTE — Telephone Encounter (Signed)
Spoke to pt's wife. He has not been on gemfibrozil for several months. Just found the bottle and called in the refill. Does he need to keep taking it?

## 2017-07-31 NOTE — Telephone Encounter (Signed)
Rx sent to pharmacy as requested per pt.

## 2017-07-31 NOTE — Telephone Encounter (Signed)
°*  STAT* If patient is at the pharmacy, call can be transferred to refill team.   1. Which medications need to be refilled? (please list name of each medication and dose if known) Enalapril ( Need a New Prescription Sent)   2. Which pharmacy/location (including street and city if local pharmacy) is medication to be sent to?[place]  3. Do they need a 30 day or 90 day supply? 1weeks worth

## 2017-08-01 ENCOUNTER — Other Ambulatory Visit: Payer: Self-pay

## 2017-08-01 NOTE — Telephone Encounter (Signed)
Left a detailed message on vm for his wife.

## 2017-08-01 NOTE — Telephone Encounter (Signed)
I am not excited about it---but if he was taking it in December when he got his last cholesterol levels checked, it might be best to go back on it. He could check with Dr Lovette Cliche as of last visit with him, it was off his list Refill if he is going to restart He needs his annual appt with me set up

## 2017-08-05 ENCOUNTER — Other Ambulatory Visit: Payer: Self-pay

## 2017-08-05 MED ORDER — ENALAPRIL MALEATE 2.5 MG PO TABS: 2.5000 mg | ORAL_TABLET | Freq: Two times a day (BID) | ORAL | 3 refills | Status: DC

## 2017-08-06 ENCOUNTER — Telehealth: Payer: Self-pay | Admitting: Cardiology

## 2017-08-06 NOTE — Telephone Encounter (Signed)
New message    [facility] is calling for clarification about a prescription.

## 2017-08-06 NOTE — Telephone Encounter (Signed)
Called [facility] pharmacy staff and clarified that enalapril 2.5mg  BID is accurate prescription.

## 2017-08-12 NOTE — Telephone Encounter (Addendum)
I spoke with pt and he states he would like to discuss with his wife and also reach out to Dr. Percival Spanish. He wanted to hold off on refilling until he speaks with his wife and he states he will have her call us back about getting him scheduled for his annual. Will await her call back

## 2017-10-28 ENCOUNTER — Telehealth: Payer: Self-pay | Admitting: Internal Medicine

## 2017-10-28 ENCOUNTER — Ambulatory Visit (INDEPENDENT_AMBULATORY_CARE_PROVIDER_SITE_OTHER): Payer: BLUE CROSS/BLUE SHIELD

## 2017-10-28 DIAGNOSIS — Z23 Encounter for immunization: Secondary | ICD-10-CM

## 2017-10-28 NOTE — Telephone Encounter (Signed)
PT dropped off disability placard form to be filled out. He said it is a renewal. I placed in Rx tower. Please call when ready for pick up.

## 2017-10-29 NOTE — Telephone Encounter (Signed)
Patient notified form is ready for pick up. °

## 2017-10-29 NOTE — Telephone Encounter (Signed)
Form done No charge 

## 2017-10-29 NOTE — Telephone Encounter (Signed)
Handicap Placard Form placed in Dr Alla German Inbox on his desk

## 2018-01-08 ENCOUNTER — Other Ambulatory Visit: Payer: Self-pay | Admitting: Physician Assistant

## 2018-01-08 NOTE — Telephone Encounter (Signed)
REFILL 

## 2018-01-09 ENCOUNTER — Inpatient Hospital Stay (HOSPITAL_COMMUNITY)
Admission: EM | Admit: 2018-01-09 | Discharge: 2018-01-13 | DRG: 246 | Disposition: A | Discharge status: Home / Self Care | Payer: BLUE CROSS/BLUE SHIELD | Attending: Cardiovascular Disease | Admitting: Cardiovascular Disease

## 2018-01-09 ENCOUNTER — Encounter (HOSPITAL_COMMUNITY): Payer: Self-pay | Admitting: Emergency Medicine

## 2018-01-09 ENCOUNTER — Emergency Department (HOSPITAL_COMMUNITY): Payer: BLUE CROSS/BLUE SHIELD

## 2018-01-09 DIAGNOSIS — Z86010 Personal history of colonic polyps: Secondary | ICD-10-CM

## 2018-01-09 DIAGNOSIS — Z823 Family history of stroke: Secondary | ICD-10-CM | POA: Diagnosis not present

## 2018-01-09 DIAGNOSIS — Z8249 Family history of ischemic heart disease and other diseases of the circulatory system: Secondary | ICD-10-CM | POA: Diagnosis not present

## 2018-01-09 DIAGNOSIS — I358 Other nonrheumatic aortic valve disorders: Secondary | ICD-10-CM | POA: Diagnosis present

## 2018-01-09 DIAGNOSIS — K5730 Diverticulosis of large intestine without perforation or abscess without bleeding: Secondary | ICD-10-CM | POA: Diagnosis not present

## 2018-01-09 DIAGNOSIS — Z6836 Body mass index (BMI) 36.0-36.9, adult: Secondary | ICD-10-CM

## 2018-01-09 DIAGNOSIS — I2510 Atherosclerotic heart disease of native coronary artery without angina pectoris: Secondary | ICD-10-CM | POA: Diagnosis not present

## 2018-01-09 DIAGNOSIS — I110 Hypertensive heart disease with heart failure: Secondary | ICD-10-CM | POA: Diagnosis present

## 2018-01-09 DIAGNOSIS — E1142 Type 2 diabetes mellitus with diabetic polyneuropathy: Secondary | ICD-10-CM | POA: Diagnosis present

## 2018-01-09 DIAGNOSIS — Z9861 Coronary angioplasty status: Secondary | ICD-10-CM | POA: Diagnosis present

## 2018-01-09 DIAGNOSIS — I2111 ST elevation (STEMI) myocardial infarction involving right coronary artery: Secondary | ICD-10-CM | POA: Diagnosis not present

## 2018-01-09 DIAGNOSIS — I959 Hypotension, unspecified: Secondary | ICD-10-CM | POA: Diagnosis present

## 2018-01-09 DIAGNOSIS — I255 Ischemic cardiomyopathy: Secondary | ICD-10-CM | POA: Diagnosis not present

## 2018-01-09 DIAGNOSIS — I723 Aneurysm of iliac artery: Secondary | ICD-10-CM | POA: Diagnosis not present

## 2018-01-09 DIAGNOSIS — Z7982 Long term (current) use of aspirin: Secondary | ICD-10-CM | POA: Diagnosis not present

## 2018-01-09 DIAGNOSIS — Z801 Family history of malignant neoplasm of trachea, bronchus and lung: Secondary | ICD-10-CM | POA: Diagnosis not present

## 2018-01-09 DIAGNOSIS — I70201 Unspecified atherosclerosis of native arteries of extremities, right leg: Secondary | ICD-10-CM | POA: Diagnosis present

## 2018-01-09 DIAGNOSIS — R079 Chest pain, unspecified: Secondary | ICD-10-CM

## 2018-01-09 DIAGNOSIS — I714 Abdominal aortic aneurysm, without rupture, unspecified: Secondary | ICD-10-CM

## 2018-01-09 DIAGNOSIS — I5023 Acute on chronic systolic (congestive) heart failure: Secondary | ICD-10-CM | POA: Diagnosis not present

## 2018-01-09 DIAGNOSIS — G4733 Obstructive sleep apnea (adult) (pediatric): Secondary | ICD-10-CM | POA: Diagnosis present

## 2018-01-09 DIAGNOSIS — E1151 Type 2 diabetes mellitus with diabetic peripheral angiopathy without gangrene: Secondary | ICD-10-CM | POA: Diagnosis not present

## 2018-01-09 DIAGNOSIS — J439 Emphysema, unspecified: Secondary | ICD-10-CM | POA: Diagnosis present

## 2018-01-09 DIAGNOSIS — Z955 Presence of coronary angioplasty implant and graft: Secondary | ICD-10-CM

## 2018-01-09 DIAGNOSIS — T463X5A Adverse effect of coronary vasodilators, initial encounter: Secondary | ICD-10-CM | POA: Diagnosis present

## 2018-01-09 DIAGNOSIS — I2119 ST elevation (STEMI) myocardial infarction involving other coronary artery of inferior wall: Secondary | ICD-10-CM | POA: Diagnosis not present

## 2018-01-09 DIAGNOSIS — K219 Gastro-esophageal reflux disease without esophagitis: Secondary | ICD-10-CM | POA: Diagnosis present

## 2018-01-09 DIAGNOSIS — F17210 Nicotine dependence, cigarettes, uncomplicated: Secondary | ICD-10-CM | POA: Diagnosis present

## 2018-01-09 DIAGNOSIS — R0602 Shortness of breath: Secondary | ICD-10-CM | POA: Diagnosis not present

## 2018-01-09 DIAGNOSIS — R072 Precordial pain: Secondary | ICD-10-CM | POA: Diagnosis not present

## 2018-01-09 DIAGNOSIS — E785 Hyperlipidemia, unspecified: Secondary | ICD-10-CM | POA: Diagnosis not present

## 2018-01-09 HISTORY — DX: Presence of cardiac pacemaker: Z95.0

## 2018-01-09 HISTORY — PX: CORONARY ANGIOPLASTY: SHX604

## 2018-01-09 LAB — I-STAT TROPONIN, ED: Troponin i, poc: 0.02 ng/mL (ref 0.00–0.08)

## 2018-01-09 MED ORDER — ONDANSETRON HCL 4 MG/2ML IJ SOLN
4.0000 mg | Freq: Once | INTRAMUSCULAR | Status: AC
  Administered 2018-01-09: 4 mg via INTRAVENOUS
  Filled 2018-01-09: qty 2

## 2018-01-09 MED ORDER — SODIUM CHLORIDE 0.9 % IV BOLUS (SEPSIS)
1000.0000 mL | Freq: Once | INTRAVENOUS | Status: AC
  Administered 2018-01-09: 1000 mL via INTRAVENOUS

## 2018-01-09 MED ORDER — HEPARIN SODIUM (PORCINE) 5000 UNIT/ML IJ SOLN
4000.0000 [IU] | Freq: Once | INTRAMUSCULAR | Status: AC
  Administered 2018-01-09: 4000 [IU] via INTRAVENOUS
  Filled 2018-01-09: qty 1

## 2018-01-09 MED ORDER — NITROGLYCERIN 2 % TD OINT
1.0000 [in_us] | TOPICAL_OINTMENT | Freq: Once | TRANSDERMAL | Status: AC
  Administered 2018-01-09: 1 [in_us] via TOPICAL
  Filled 2018-01-09: qty 1

## 2018-01-09 MED ORDER — SODIUM CHLORIDE 0.9 % IV SOLN
INTRAVENOUS | Status: DC
  Administered 2018-01-09: via INTRAVENOUS

## 2018-01-09 MED ORDER — FENTANYL CITRATE (PF) 100 MCG/2ML IJ SOLN
50.0000 ug | Freq: Once | INTRAMUSCULAR | Status: AC
  Administered 2018-01-09: 50 ug via INTRAVENOUS
  Filled 2018-01-09: qty 2

## 2018-01-09 NOTE — ED Notes (Signed)
Blood drawn by RN Minna Merritts

## 2018-01-09 NOTE — ED Triage Notes (Signed)
Patient arrived with EMS from home reports central chest " burning " radiating to left shoulder this evening , received ASA 324 mg po and 1 NTG sl . No SOB , nausea or diaphoresis .

## 2018-01-09 NOTE — ED Notes (Signed)
Patient placed on a monitor / pulse oximetry , EKG done , pt. undressed , 2 IV saline lock intact . Patient waiting call from cathlab.

## 2018-01-09 NOTE — ED Provider Notes (Signed)
[facility] EMERGENCY DEPARTMENT Provider Note  CSN: [id number] Arrival date & time: 01/09/18 2310  Chief Complaint(s) Chest Pain  HPI [name] is a 62 y.o. male with a history of hypertension, hyperlipidemia, reported nonobstructing CAD, and heart failure who presents to the emergency department with 1 hour of substernal chest pain described as pressure/burning that radiated to the neck.  Patient also endorses mild shortness of breath and diaphoresis with nausea.  Denies any emesis.  Denies any recent fevers or infections.  Patient reports that over the past week he has been having the same intermittent pain that comes sporadically lasting about 10 minutes and then resolving on its own.  He denies any alleviating or aggravating factors.  Denies any other physical complaints at this time.  Patient was brought in by EMS who gave the patient a full dose of aspirin and also sublingual nitroglycerin.  Initial EKG was reassuring however repeat EKG just prior to pulling and was concerning for ST segment elevation MI in the inferior leads.  The history is provided by the patient.    Past Medical History Past Medical History:  Diagnosis Date  . Arthritis   . Automatic implantable cardioverter-defibrillator in situ   . Benign essential HTN 09/17/2016  . COPD (chronic obstructive pulmonary disease) ([place])    emphysema by CXR  . Coronary artery disease     LAD a 60-70% stenosis, moderate size second diagonal with 50% stenosis, circumflex AV groove 75-80% stenosis with a branching obtuse marginal with a superior branch subtotal stenosis followed by diffuse disease, right coronary artery  had nonobstructive disease.  EF was 35%  . Diabetes mellitus    type 2  . Diverticulosis of colon   . GERD (gastroesophageal reflux disease)    with esophagitis  . History of colonic polyps   . Hypertension   . Non-ischemic cardiomyopathy ([place])   . PVD (peripheral vascular disease) ([place])      bilateral common iliac artery aneurysms. Right SFA occlusion over a long segment. Left SFA disease with occlusion of the left TP trunk. Artirogram Oct. 2006  . Shortness of breath    exertion  . Sleep apnea   . Tobacco abuse   . Urinary incontinence    detrussor instability   Patient Active Problem List   Diagnosis Date Noted  . Bilateral carotid artery stenosis 04/28/2017  . Dyslipidemia 04/28/2017  . Bilateral low back pain without sciatica 04/14/2017  . Cerumen impaction 11/25/2016  . Benign essential HTN 09/17/2016  . CAD (coronary artery disease), native coronary artery 09/17/2016  . Mood disorder ([place]) 11/29/2015  . Left leg swelling 05/15/2015  . COPD exacerbation ([place]) 12/15/2012  . Routine general medical examination at a health care facility 10/26/2012  . Atherosclerosis of native coronary artery with angina pectoris ([place]) 10/17/2011  . OSA (obstructive sleep apnea) 08/28/2011  . Obesity 08/27/2011  . Chronic systolic heart failure ([place]) 08/06/2011  . B12 DEFICIENCY 04/14/2009  . Tobacco use 02/16/2009  . CAROTID BRUIT 02/16/2009  . Diabetic polyneuropathy ([place]) 12/07/2008  . URINARY INCONTINENCE 05/27/2008  . COPD (chronic obstructive pulmonary disease) with emphysema ([place]) 02/09/2008  . Hyperlipemia 10/13/2007  . Peripheral vascular disease in diabetes mellitus ([place]) 09/15/2007  . HEMORRHOIDS, EXTERNAL 09/15/2007  . DIVERTICULOSIS, COLON 09/15/2007  . COLONIC POLYPS, HX OF 09/15/2007  . Type 2 diabetes, controlled, with peripheral circulatory disorder ([place]) 05/25/2007  . GERD 05/25/2007  . REFLUX ESOPHAGITIS 04/23/2007   Home Medication(s) Prior to Admission medications  Medication Sig Start Date End Date Taking? Authorizing Provider  aspirin 81 MG tablet Take 81 mg by mouth daily.     [provider]  bisoprolol (ZEBETA) 5 MG tablet Take 0.5 tablets (2.5 mg total) by mouth daily. 04/28/17   Minus Breeding, MD  bisoprolol (ZEBETA) 5 MG tablet Take  0.5 tablets (2.5 mg total) by mouth daily. NEED OV. 01/08/18   Barrett, Evelene Croon, PA-C  enalapril (VASOTEC) 2.5 MG tablet Take 1 tablet (2.5 mg total) by mouth 2 (two) times daily. 08/05/17   [place], Peter M, MD  isosorbide mononitrate (ISMO,MONOKET) 10 MG tablet Take 1 tablet (10 mg total) by mouth 2 (two) times daily. 08/27/16   Barrett, Evelene Croon, PA-C  methocarbamol (ROBAXIN) 500 MG tablet Take 500 mg by mouth 3 (three) times daily.    [provider]  multivitamin [facility]) per tablet Take 1 tablet by mouth daily.     [provider]  vitamin B-12 (CYANOCOBALAMIN) 1000 MCG tablet Take 1,000 mcg by mouth daily.     [provider]                                                                                                                                    Past Surgical History Past Surgical History:  Procedure Laterality Date  . CARDIAC CATHETERIZATION  06/2004   negative  . COLONOSCOPY  04/2005  . COPD exacerbation    . PACEMAKER INSERTION  11/2005  . PACEMAKER LEAD REMOVAL N/A 10/17/2014   Procedure: PACEMAKER LEAD REMOVAL;  Surgeon: Evans Lance, MD;  Location: [facility] OR;  Service: Cardiovascular;  Laterality: N/A;  . s/p ICD placement      Medtronic Maximo [phone] single chamber   Family History Family History  Problem Relation Age of Onset  . Stroke Father   . Coronary artery disease Maternal Aunt   . Heart failure Maternal Aunt   . Lung cancer Maternal Aunt   . Lung cancer Maternal Uncle   . Cancer Brother        Mouth  . Cancer Sister        throat    Social History Social History   Tobacco Use  . Smoking status: Current Every Day Smoker    Packs/day: 2.00    Years: 15.00    Pack years: 30.00    Types: Cigarettes  . Smokeless tobacco: Never Used  . Tobacco comment: GAVE 1-800-QUIT-NOW  Substance Use Topics  . Alcohol use: No  . Drug use: No   Allergies Patient has no known allergies.  Review of Systems Review of Systems All  other systems are reviewed and are negative for acute change except as noted in the HPI  Physical Exam Vital Signs  I have reviewed the triage vital signs BP 94/62 (BP Location: Right Arm)   Pulse 64   Temp (!) 97.4 F (36.3 C) (Oral)   Resp 18  Ht 5\' 11"  (1.803 m)   Wt 120.2 kg (265 lb)   SpO2 97%   BMI 36.96 kg/m   Physical Exam  Constitutional: He is oriented to person, place, and time. He appears well-developed and well-nourished. He appears ill.  In obvious discomfort  HENT:  Head: Normocephalic and atraumatic.  Nose: Nose normal.  Eyes: Conjunctivae and EOM are normal. Pupils are equal, round, and reactive to light. Right eye exhibits no discharge. Left eye exhibits no discharge. No scleral icterus.  Neck: Normal range of motion. Neck supple.  Cardiovascular: Normal rate and regular rhythm. Exam reveals no gallop and no friction rub.  No murmur heard. Pulmonary/Chest: Effort normal and breath sounds normal. No stridor. No respiratory distress. He has no rales.  Abdominal: Soft. He exhibits no distension. There is no tenderness.  Musculoskeletal: He exhibits no edema or tenderness.  Neurological: He is alert and oriented to person, place, and time.  Skin: Skin is warm. No rash noted. He is diaphoretic. No erythema.  Psychiatric: He has a normal mood and affect.  Vitals reviewed.   ED Results and Treatments Labs (all labs ordered are listed, but only abnormal results are displayed) Labs Reviewed  COMPREHENSIVE METABOLIC PANEL  LIPASE, BLOOD  BRAIN NATRIURETIC PEPTIDE  CBC WITH DIFFERENTIAL/PLATELET  PROTIME-INR  APTT  LIPID PANEL  I-STAT TROPONIN, ED                                                                                                                         EKG  EKG Interpretation  Date/Time:  Friday January 09 2018 23:10:48 EST Ventricular Rate:  63 PR Interval:    QRS Duration: 116 QT Interval:  460 QTC Calculation: 471 R Axis:   79 Text  Interpretation:  Sinus rhythm Prolonged PR interval Nonspecific intraventricular conduction delay Minimal ST depression, anterolateral leads ST elevation, consider inferior injury Confirmed by Addison Lank [phone]) on 01/09/2018 11:54:25 PM      Radiology Dg Chest Port 1 View  Result Date: 01/09/2018 CLINICAL DATA:  Chest pain and shortness of breath EXAM: PORTABLE CHEST 1 VIEW COMPARISON:  Chest radiograph 03/02/2015 FINDINGS: Mild interstitial pulmonary edema. Mild cardiomegaly. No focal consolidation. No pleural effusion or pneumothorax. IMPRESSION: Mild cardiomegaly and mild interstitial edema. Electronically Signed   By: Ulyses Jarred M.D.   On: 01/09/2018 23:48   Pertinent labs & imaging results that were available during my care of the patient were reviewed by me and considered in my medical decision making (see chart for details).  Medications Ordered in ED Medications  0.9 %  sodium chloride infusion ( Intravenous New Bag/Given 01/09/18 2334)  nitroGLYCERIN (NITROGLYN) 2 % ointment 1 inch (1 inch Topical Given 01/09/18 2328)  fentaNYL (SUBLIMAZE) injection 50 mcg (50 mcg Intravenous Given 01/09/18 2328)  ondansetron (ZOFRAN) injection 4 mg (4 mg Intravenous Given 01/09/18 2335)  sodium chloride 0.9 % bolus 1,000 mL (1,000 mLs Intravenous New Bag/Given 01/09/18 2338)  heparin injection 4,000 Units (4,000 Units  Intravenous Given 01/09/18 2354)                                                                                                                                    Procedures Procedures CRITICAL CARE Performed by: Grayce Sessions Cardama Total critical care time: 40 minutes Critical care time was exclusive of separately billable procedures and treating other patients. Critical care was necessary to treat or prevent imminent or life-threatening deterioration. Critical care was time spent personally by me on the following activities: development of treatment plan with patient and/or  surrogate as well as nursing, discussions with consultants, evaluation of patient's response to treatment, examination of patient, obtaining history from patient or surrogate, ordering and performing treatments and interventions, ordering and review of laboratory studies, ordering and review of radiographic studies, pulse oximetry and re-evaluation of patient's condition.   (including critical care time)  Medical Decision Making / ED Course I have reviewed the nursing notes for this encounter and the patient's prior records (if available in EHR or on provided paperwork).    Repeat EKG concerning for STEMI in the inferior leads.  Code STEMI was initiated.  Discussed the case with Dr. Gwenlyn Found and the cardiology fellow who agreed to take the patient up to the Cath Lab.   Nitropaste was removed due to hypotension.  Patient is already receiving IV fluid bolus which is improving his blood pressure.  Heparin was initiated in the emergency department.  Final Clinical Impression(s) / ED Diagnoses Final diagnoses:  Chest pain  ST elevation myocardial infarction involving right coronary artery [facility])      This chart was dictated using voice recognition software.  Despite best efforts to proofread,  errors can occur which can change the documentation meaning.   Fatima Blank, MD 01/14/18 [phone]

## 2018-01-09 NOTE — ED Notes (Addendum)
Dr. Massie Bougie ( cardiologist) at bedside , NS IV bolus infusing , IV sites intact . Chest pain 10/10 . Waiting call from cath lab . Cardiologist explained plan of care to pt. and family.

## 2018-01-10 ENCOUNTER — Encounter (HOSPITAL_COMMUNITY): Payer: Self-pay

## 2018-01-10 ENCOUNTER — Other Ambulatory Visit: Payer: Self-pay

## 2018-01-10 ENCOUNTER — Inpatient Hospital Stay (HOSPITAL_COMMUNITY)
Admission: EM | Disposition: A | Discharge status: Home / Self Care | Payer: Self-pay | Source: Home / Self Care | Attending: Cardiovascular Disease

## 2018-01-10 DIAGNOSIS — I5023 Acute on chronic systolic (congestive) heart failure: Secondary | ICD-10-CM | POA: Diagnosis present

## 2018-01-10 DIAGNOSIS — T463X5A Adverse effect of coronary vasodilators, initial encounter: Secondary | ICD-10-CM | POA: Diagnosis present

## 2018-01-10 DIAGNOSIS — I714 Abdominal aortic aneurysm, without rupture, unspecified: Secondary | ICD-10-CM

## 2018-01-10 DIAGNOSIS — Z7982 Long term (current) use of aspirin: Secondary | ICD-10-CM | POA: Diagnosis not present

## 2018-01-10 DIAGNOSIS — G4733 Obstructive sleep apnea (adult) (pediatric): Secondary | ICD-10-CM | POA: Diagnosis present

## 2018-01-10 DIAGNOSIS — E1151 Type 2 diabetes mellitus with diabetic peripheral angiopathy without gangrene: Secondary | ICD-10-CM | POA: Diagnosis present

## 2018-01-10 DIAGNOSIS — K5730 Diverticulosis of large intestine without perforation or abscess without bleeding: Secondary | ICD-10-CM | POA: Diagnosis present

## 2018-01-10 DIAGNOSIS — K219 Gastro-esophageal reflux disease without esophagitis: Secondary | ICD-10-CM | POA: Diagnosis present

## 2018-01-10 DIAGNOSIS — Z955 Presence of coronary angioplasty implant and graft: Secondary | ICD-10-CM | POA: Diagnosis not present

## 2018-01-10 DIAGNOSIS — I2119 ST elevation (STEMI) myocardial infarction involving other coronary artery of inferior wall: Secondary | ICD-10-CM | POA: Diagnosis present

## 2018-01-10 DIAGNOSIS — Z86010 Personal history of colonic polyps: Secondary | ICD-10-CM | POA: Diagnosis not present

## 2018-01-10 DIAGNOSIS — I2111 ST elevation (STEMI) myocardial infarction involving right coronary artery: Secondary | ICD-10-CM | POA: Diagnosis not present

## 2018-01-10 DIAGNOSIS — Z8249 Family history of ischemic heart disease and other diseases of the circulatory system: Secondary | ICD-10-CM | POA: Diagnosis not present

## 2018-01-10 DIAGNOSIS — J439 Emphysema, unspecified: Secondary | ICD-10-CM | POA: Diagnosis present

## 2018-01-10 DIAGNOSIS — I723 Aneurysm of iliac artery: Secondary | ICD-10-CM | POA: Diagnosis present

## 2018-01-10 DIAGNOSIS — E785 Hyperlipidemia, unspecified: Secondary | ICD-10-CM | POA: Diagnosis present

## 2018-01-10 DIAGNOSIS — I2510 Atherosclerotic heart disease of native coronary artery without angina pectoris: Secondary | ICD-10-CM | POA: Diagnosis present

## 2018-01-10 DIAGNOSIS — I255 Ischemic cardiomyopathy: Secondary | ICD-10-CM | POA: Diagnosis not present

## 2018-01-10 DIAGNOSIS — I110 Hypertensive heart disease with heart failure: Secondary | ICD-10-CM | POA: Diagnosis present

## 2018-01-10 DIAGNOSIS — R079 Chest pain, unspecified: Secondary | ICD-10-CM | POA: Diagnosis not present

## 2018-01-10 DIAGNOSIS — Z823 Family history of stroke: Secondary | ICD-10-CM | POA: Diagnosis not present

## 2018-01-10 DIAGNOSIS — Z6836 Body mass index (BMI) 36.0-36.9, adult: Secondary | ICD-10-CM | POA: Diagnosis not present

## 2018-01-10 DIAGNOSIS — Z801 Family history of malignant neoplasm of trachea, bronchus and lung: Secondary | ICD-10-CM | POA: Diagnosis not present

## 2018-01-10 DIAGNOSIS — I70201 Unspecified atherosclerosis of native arteries of extremities, right leg: Secondary | ICD-10-CM | POA: Diagnosis present

## 2018-01-10 DIAGNOSIS — E1142 Type 2 diabetes mellitus with diabetic polyneuropathy: Secondary | ICD-10-CM | POA: Diagnosis present

## 2018-01-10 DIAGNOSIS — I959 Hypotension, unspecified: Secondary | ICD-10-CM | POA: Diagnosis present

## 2018-01-10 DIAGNOSIS — F17210 Nicotine dependence, cigarettes, uncomplicated: Secondary | ICD-10-CM | POA: Diagnosis present

## 2018-01-10 HISTORY — PX: CORONARY STENT INTERVENTION: CATH118234

## 2018-01-10 HISTORY — PX: CORONARY/GRAFT ACUTE MI REVASCULARIZATION: CATH118305

## 2018-01-10 HISTORY — PX: LEFT HEART CATH AND CORONARY ANGIOGRAPHY: CATH118249

## 2018-01-10 LAB — CBC WITH DIFFERENTIAL/PLATELET
BASOS ABS: 0.1 10*3/uL (ref 0.0–0.1)
BASOS PCT: 1 %
Eosinophils Absolute: 0.3 10*3/uL (ref 0.0–0.7)
Eosinophils Relative: 2 %
HCT: 43.2 % (ref 39.0–52.0)
HEMOGLOBIN: 15.4 g/dL (ref 13.0–17.0)
LYMPHS ABS: 6.1 10*3/uL — AB (ref 0.7–4.0)
Lymphocytes Relative: 45 %
MCH: 29.3 pg (ref 26.0–34.0)
MCHC: 35.6 g/dL (ref 30.0–36.0)
MCV: 82.3 fL (ref 78.0–100.0)
MONOS PCT: 6 %
Monocytes Absolute: 0.8 10*3/uL (ref 0.1–1.0)
NEUTROS ABS: 6.3 10*3/uL (ref 1.7–7.7)
Neutrophils Relative %: 46 %
Platelets: 274 10*3/uL (ref 150–400)
RBC: 5.25 MIL/uL (ref 4.22–5.81)
RDW: 13.6 % (ref 11.5–15.5)
WBC: 13.6 10*3/uL — ABNORMAL HIGH (ref 4.0–10.5)

## 2018-01-10 LAB — BASIC METABOLIC PANEL
ANION GAP: 11 (ref 5–15)
BUN: 8 mg/dL (ref 6–20)
CALCIUM: 8.8 mg/dL — AB (ref 8.9–10.3)
CO2: 24 mmol/L (ref 22–32)
Chloride: 101 mmol/L (ref 101–111)
Creatinine, Ser: 0.83 mg/dL (ref 0.61–1.24)
Glucose, Bld: 111 mg/dL — ABNORMAL HIGH (ref 65–99)
Potassium: 4.1 mmol/L (ref 3.5–5.1)
Sodium: 136 mmol/L (ref 135–145)

## 2018-01-10 LAB — COMPREHENSIVE METABOLIC PANEL
ALBUMIN: 3.6 g/dL (ref 3.5–5.0)
ALT: 18 U/L (ref 17–63)
AST: 25 U/L (ref 15–41)
Alkaline Phosphatase: 79 U/L (ref 38–126)
Anion gap: 13 (ref 5–15)
BILIRUBIN TOTAL: 0.8 mg/dL (ref 0.3–1.2)
BUN: 10 mg/dL (ref 6–20)
CHLORIDE: 99 mmol/L — AB (ref 101–111)
CO2: 21 mmol/L — AB (ref 22–32)
Calcium: 8.5 mg/dL — ABNORMAL LOW (ref 8.9–10.3)
Creatinine, Ser: 0.9 mg/dL (ref 0.61–1.24)
GFR calc Af Amer: >60 mL/min (ref >60)
GFR calc non Af Amer: >60 mL/min (ref >60)
GLUCOSE: 232 mg/dL — AB (ref 65–99)
Potassium: 3.9 mmol/L (ref 3.5–5.1)
SODIUM: 133 mmol/L — AB (ref 135–145)
Total Protein: 6.3 g/dL — ABNORMAL LOW (ref 6.5–8.1)

## 2018-01-10 LAB — GLUCOSE, CAPILLARY
Glucose-Capillary: 110 mg/dL — ABNORMAL HIGH (ref 65–99)
Glucose-Capillary: 102 mg/dL — ABNORMAL HIGH (ref 65–99)
Glucose-Capillary: 108 mg/dL — ABNORMAL HIGH (ref 65–99)

## 2018-01-10 LAB — APTT: APTT: 30 s (ref 24–36)

## 2018-01-10 LAB — HEMOGLOBIN A1C
HEMOGLOBIN A1C: 6.3 % — AB (ref 4.8–5.6)
Hgb A1c MFr Bld: 6.1 % — ABNORMAL HIGH (ref 4.8–5.6)
MEAN PLASMA GLUCOSE: 134.11 mg/dL
Mean Plasma Glucose: 128.37 mg/dL

## 2018-01-10 LAB — CBC
HCT: 41.7 % (ref 39.0–52.0)
HEMOGLOBIN: 14.5 g/dL (ref 13.0–17.0)
MCH: 28.7 pg (ref 26.0–34.0)
MCHC: 34.8 g/dL (ref 30.0–36.0)
MCV: 82.4 fL (ref 78.0–100.0)
Platelets: 229 10*3/uL (ref 150–400)
RBC: 5.06 MIL/uL (ref 4.22–5.81)
RDW: 13.9 % (ref 11.5–15.5)
WBC: 9.1 10*3/uL (ref 4.0–10.5)

## 2018-01-10 LAB — LIPID PANEL
CHOLESTEROL: 278 mg/dL — AB (ref 0–200)
HDL: 30 mg/dL — AB (ref >40)
LDL Cholesterol: UNDETERMINED mg/dL (ref 0–99)
TRIGLYCERIDES: 685 mg/dL — AB (ref <150)
Total CHOL/HDL Ratio: 9.3 RATIO
VLDL: UNDETERMINED mg/dL (ref 0–40)

## 2018-01-10 LAB — TSH: TSH: 1.214 u[IU]/mL (ref 0.350–4.500)

## 2018-01-10 LAB — TROPONIN I: Troponin I: 4.48 ng/mL (ref <0.03)

## 2018-01-10 LAB — POCT ACTIVATED CLOTTING TIME
ACTIVATED CLOTTING TIME: 0 s
ACTIVATED CLOTTING TIME: 131 s

## 2018-01-10 LAB — PROTIME-INR
INR: 0.99
Prothrombin Time: 13 seconds (ref 11.4–15.2)

## 2018-01-10 LAB — MRSA PCR SCREENING: MRSA BY PCR: NEGATIVE

## 2018-01-10 LAB — BRAIN NATRIURETIC PEPTIDE: B Natriuretic Peptide: 26.1 pg/mL (ref 0.0–100.0)

## 2018-01-10 LAB — LIPASE, BLOOD: Lipase: 32 U/L (ref 11–51)

## 2018-01-10 SURGERY — CORONARY/GRAFT ACUTE MI REVASCULARIZATION
Anesthesia: LOCAL

## 2018-01-10 MED ORDER — TICAGRELOR 90 MG PO TABS
ORAL_TABLET | ORAL | Status: DC | PRN
  Administered 2018-01-10: 180 mg via ORAL

## 2018-01-10 MED ORDER — HEPARIN (PORCINE) IN NACL 2-0.9 UNIT/ML-% IJ SOLN
INTRAMUSCULAR | Status: AC | PRN
  Administered 2018-01-10: 1000 mL

## 2018-01-10 MED ORDER — TICAGRELOR 90 MG PO TABS
ORAL_TABLET | ORAL | Status: AC
  Filled 2018-01-10: qty 2

## 2018-01-10 MED ORDER — LABETALOL HCL 5 MG/ML IV SOLN: 10.0000 mg | INTRAVENOUS | Status: AC | PRN

## 2018-01-10 MED ORDER — CARVEDILOL 3.125 MG PO TABS
3.1250 mg | ORAL_TABLET | Freq: Two times a day (BID) | ORAL | Status: DC
  Administered 2018-01-10 – 2018-01-13 (×7): 3.125 mg via ORAL
  Filled 2018-01-10 (×7): qty 1

## 2018-01-10 MED ORDER — SODIUM CHLORIDE 0.9 % IV SOLN
INTRAVENOUS | Status: AC | PRN
  Administered 2018-01-10 (×2): 1.75 mg/kg/h via INTRAVENOUS

## 2018-01-10 MED ORDER — NITROGLYCERIN 1 MG/10 ML FOR IR/CATH LAB
INTRA_ARTERIAL | Status: AC
  Filled 2018-01-10: qty 10

## 2018-01-10 MED ORDER — SODIUM CHLORIDE 0.9% FLUSH
3.0000 mL | Freq: Two times a day (BID) | INTRAVENOUS | Status: DC
  Administered 2018-01-10 – 2018-01-13 (×6): 3 mL via INTRAVENOUS

## 2018-01-10 MED ORDER — SODIUM CHLORIDE 0.9 % IV SOLN
1.7500 mg/kg/h | INTRAVENOUS | Status: DC
  Administered 2018-01-10 (×3): 1.75 mg/kg/h via INTRAVENOUS
  Filled 2018-01-10: qty 250

## 2018-01-10 MED ORDER — SODIUM CHLORIDE 0.9% FLUSH: 3.0000 mL | INTRAVENOUS | Status: DC | PRN

## 2018-01-10 MED ORDER — ONDANSETRON HCL 4 MG/2ML IJ SOLN: 4.0000 mg | Freq: Four times a day (QID) | INTRAMUSCULAR | Status: DC | PRN

## 2018-01-10 MED ORDER — BIVALIRUDIN TRIFLUOROACETATE 250 MG IV SOLR
INTRAVENOUS | Status: AC
  Filled 2018-01-10: qty 250

## 2018-01-10 MED ORDER — ATORVASTATIN CALCIUM 80 MG PO TABS
80.0000 mg | ORAL_TABLET | Freq: Every day | ORAL | Status: DC
  Administered 2018-01-10 – 2018-01-12 (×3): 80 mg via ORAL
  Filled 2018-01-10 (×3): qty 1

## 2018-01-10 MED ORDER — LIDOCAINE HCL (PF) 1 % IJ SOLN
INTRAMUSCULAR | Status: AC
  Filled 2018-01-10: qty 30

## 2018-01-10 MED ORDER — IOPAMIDOL (ISOVUE-370) INJECTION 76%
INTRAVENOUS | Status: AC
  Filled 2018-01-10: qty 50

## 2018-01-10 MED ORDER — SODIUM CHLORIDE 0.9 % IV SOLN
250.0000 mL | INTRAVENOUS | Status: DC | PRN
  Administered 2018-01-10: 250 mL via INTRAVENOUS

## 2018-01-10 MED ORDER — ATROPINE SULFATE 1 MG/10ML IJ SOSY
PREFILLED_SYRINGE | INTRAMUSCULAR | Status: AC
  Filled 2018-01-10: qty 10

## 2018-01-10 MED ORDER — IOPAMIDOL (ISOVUE-370) INJECTION 76%
INTRAVENOUS | Status: AC
  Filled 2018-01-10: qty 100

## 2018-01-10 MED ORDER — ASPIRIN 81 MG PO CHEW
81.0000 mg | CHEWABLE_TABLET | Freq: Every day | ORAL | Status: DC
  Administered 2018-01-10 – 2018-01-13 (×4): 81 mg via ORAL
  Filled 2018-01-10 (×4): qty 1

## 2018-01-10 MED ORDER — IOPAMIDOL (ISOVUE-370) INJECTION 76%
INTRAVENOUS | Status: AC
  Filled 2018-01-10: qty 125

## 2018-01-10 MED ORDER — SPIRONOLACTONE 12.5 MG HALF TABLET
12.5000 mg | ORAL_TABLET | Freq: Every day | ORAL | Status: DC
  Administered 2018-01-10 – 2018-01-13 (×4): 12.5 mg via ORAL
  Filled 2018-01-10 (×4): qty 1

## 2018-01-10 MED ORDER — LIDOCAINE HCL (PF) 1 % IJ SOLN
INTRAMUSCULAR | Status: DC | PRN
  Administered 2018-01-10: 20 mL

## 2018-01-10 MED ORDER — ASPIRIN 81 MG PO TABS: 81.0000 mg | ORAL_TABLET | Freq: Every day | ORAL | Status: DC

## 2018-01-10 MED ORDER — IOPAMIDOL (ISOVUE-370) INJECTION 76%
INTRAVENOUS | Status: DC | PRN
  Administered 2018-01-10: 235 mL via INTRAVENOUS

## 2018-01-10 MED ORDER — HYDRALAZINE HCL 20 MG/ML IJ SOLN: 5.0000 mg | INTRAMUSCULAR | Status: AC | PRN

## 2018-01-10 MED ORDER — BIVALIRUDIN BOLUS VIA INFUSION - CUPID
INTRAVENOUS | Status: DC | PRN
  Administered 2018-01-10: 90.15 mg via INTRAVENOUS

## 2018-01-10 MED ORDER — SODIUM CHLORIDE 0.9 % IV SOLN
INTRAVENOUS | Status: DC
  Administered 2018-01-10: 02:00:00 via INTRAVENOUS

## 2018-01-10 MED ORDER — MORPHINE SULFATE (PF) 4 MG/ML IV SOLN: 2.0000 mg | INTRAVENOUS | Status: DC | PRN

## 2018-01-10 MED ORDER — TICAGRELOR 90 MG PO TABS
90.0000 mg | ORAL_TABLET | Freq: Two times a day (BID) | ORAL | Status: DC
  Administered 2018-01-10 – 2018-01-13 (×7): 90 mg via ORAL
  Filled 2018-01-10 (×7): qty 1

## 2018-01-10 MED ORDER — HEPARIN (PORCINE) IN NACL 2-0.9 UNIT/ML-% IJ SOLN
INTRAMUSCULAR | Status: AC
  Filled 2018-01-10: qty 1000

## 2018-01-10 MED ORDER — ACETAMINOPHEN 325 MG PO TABS
650.0000 mg | ORAL_TABLET | ORAL | Status: DC | PRN
  Administered 2018-01-10: 650 mg via ORAL
  Filled 2018-01-10: qty 2

## 2018-01-10 MED ORDER — LOSARTAN POTASSIUM 25 MG PO TABS
25.0000 mg | ORAL_TABLET | Freq: Every day | ORAL | Status: DC
  Administered 2018-01-10 – 2018-01-13 (×4): 25 mg via ORAL
  Filled 2018-01-10 (×4): qty 1

## 2018-01-10 SURGICAL SUPPLY — 22 items
BALLN SAPPHIRE 2.0X12 (BALLOONS) ×2
BALLN ~~LOC~~ EMERGE MR 2.75X20 (BALLOONS) ×2
BALLOON SAPPHIRE 2.0X12 (BALLOONS) ×1 IMPLANT
BALLOON ~~LOC~~ EMERGE MR 2.75X20 (BALLOONS) ×1 IMPLANT
CATH INFINITI 5FR MULTPACK ANG (CATHETERS) ×2 IMPLANT
CATH VISTA GUIDE 6FR JR4 (CATHETERS) ×2 IMPLANT
CATH VISTA GUIDE 6FR XB3.5 (CATHETERS) ×2 IMPLANT
GLIDESHEATH SLEND A-KIT 6F 22G (SHEATH) ×2 IMPLANT
HOVERMATT SINGLE USE (MISCELLANEOUS) ×2 IMPLANT
KIT ENCORE 26 ADVANTAGE (KITS) ×2 IMPLANT
KIT HEART LEFT (KITS) ×2 IMPLANT
PACK CARDIAC CATHETERIZATION (CUSTOM PROCEDURE TRAY) ×2 IMPLANT
SHEATH PINNACLE 6F 10CM (SHEATH) ×2 IMPLANT
STENT SYNERGY DES 2.25X20 (Permanent Stent) ×2 IMPLANT
STENT SYNERGY DES 2.5X24 (Permanent Stent) ×2 IMPLANT
SYR MEDRAD MARK V 150ML (SYRINGE) ×2 IMPLANT
TRANSDUCER W/STOPCOCK (MISCELLANEOUS) ×2 IMPLANT
TUBING CIL FLEX 10 FLL-RA (TUBING) ×2 IMPLANT
WIRE ASAHI PROWATER 180CM (WIRE) ×2 IMPLANT
WIRE EMERALD 3MM-J .035X150CM (WIRE) ×2 IMPLANT
WIRE EMERALD 3MM-J .035X260CM (WIRE) ×2 IMPLANT
WIRE HI TORQ VERSACORE-J 145CM (WIRE) ×2 IMPLANT

## 2018-01-10 NOTE — Progress Notes (Signed)
CARDIAC REHAB PHASE I  Patient remained on bedrest from s/p cardiac cath and sheath removal. MI and stent education initiated with patient and wife. Patient requested all education be done with wife however he was active in discussion of smoking cessation, activity progression, end points to activity progression and NTG use, and phase 2 cardiac rehab. Patient extremely burdened related to inability to ambulate secondary to PVD claudication. States I have been to surgeon 3 times and he says he cant help me so all I can do is sit around". Patient encouraged to be as active as possible. Phase 2 referral placed for [place]. MI booklet and diet reviewed with wife however patient will need reinforcement prior to D/C if possible. Pt aware of need to weight self daily as HF not a new diagnosis however he chooses to be non-compliant with HF recommendations. Will follow up.  Swan Fairfax English PayneRN, BSN 01/10/2018 4:04 PM

## 2018-01-10 NOTE — Progress Notes (Signed)
  62 yo patient with obesity, tobacco abuse,  DM2, PAD, CAD iCM EF 35% (s/p ICD but EOL) admitted early this am with inferior STEMI.   Cath with:  LAD 50% LCx mid 100% s/p PCI with DES RCA mid 100% s/p PCI with DES (culprit)  EF 20-25%   Large AAA noted.   Stable this am. No further CP.  TGs 685. Groin site ok.   Plan: 1) Continue post-MI care with ASA, statin, b-blocker and Brilinta 2) Add losartan +/- spiro (WIll not start Entresto yet due to previous low BP with low-dose ACE-I) 3) Follow lipids 4) Will need further imaging to assess and size AAA 5) CR to see 6) Check HgBa1c. DM2 consult. Add metformin and Jardiance at d/c 7) Counseled on need for smoking cessation .\  Glori Bickers, MD  11:57 AM

## 2018-01-10 NOTE — H&P (Signed)
CARDIOLOGY H&P  HPI:  Patient is a 62 y/o who follows with Dr. Percival Spanish with hx of CAD, PVD, Type 2 DM, HTN, HLP, Non-ischemic cardiomyopathy s/p improvement to 55% (no replacement ICD after ERI) comes in today complaining of sudden onset of chest pain that started 1 hr ago. Pain is left sided, reproducible, burning in nature. Pain radiates down the L arm and he has associated numbness in the left arm. He was given a sublingual NTG in transit, but this didn't improve his pain. On arriving to the hospital he had a NTG patch placed, which dropped his BP's in to the MAP's of low 60's. The initial EKG also looked as if patient had inferior ST elevations. Cardiology was consulted for further recommendations. On repeating EKG, there was no evidence of these ST elevations, but a repeat EKG obtained showed them again. At this point, given the hypotension, NTG patch was removed and patient was started on IV saline which improved his MAP's in the the 70-80's. Initial troponin was negative, but patient continued to have refractory pain, hence, he was taken to the cath lab for further evaluation.    He also added that he's had ongoing L sided chest pain that came on suddenly all last week. He had a minimum of one episode/day, but pain resolved spontaneously after 10 minutes without intervention. Due to this pain being worse and unremitting, patient came to the hospital for further evaluation. Patient has known CAD hx in the past as indicated below, but this was managed medically. He also had a stress test (nuclear) b/c he had chest pain which showed an inferior infarct from apex to base with mild peri-infarct ischemia prior to the heart catheterization, however it as noted that if patient continued to have symptoms that revascularization would be considered.  Review of Systems:     Cardiac Review of Systems: {Y] = yes [ ]  = no  Chest Pain [ Y   ]  Resting SOB [   ] Exertional SOB  [ Y ]  Orthopnea [  ]   Pedal  Edema [   ]    Palpitations [  ] Syncope  [  ]   Presyncope [   ]  General Review of Systems: [Y] = yes [  ]=no Constitional: recent weight change [  ]; anorexia [  ]; fatigue [  ]; nausea [ Y ]; night sweats [  ]; fever [  ]; or chills [  ];                                                                     Dental: poor dentition[  ];   Eye : blurred vision [  ]; diplopia [   ]; vision changes [  ];  Amaurosis fugax[  ]; Resp: cough [  ];  wheezing[  ];  hemoptysis[  ]; shortness of breath[  ]; paroxysmal nocturnal dyspnea[  ]; dyspnea on exertion[Y  ]; or orthopnea[  ];  GI:  gallstones[  ], vomiting[  ];  dysphagia[  ]; melena[  ];  hematochezia [  ]; heartburn[  ];   GU: kidney stones [  ]; hematuria[  ];   dysuria [  ];  nocturia[  ];  Skin: rash [  ], swelling[  ];, hair loss[  ];  peripheral edema[  ];  or itching[  ]; Musculosketetal: myalgias[  ];  joint swelling[  ];  joint erythema[  ];  joint pain[  ];  back pain[  ];  Heme/Lymph: bruising[  ];  bleeding[  ];  anemia[  ];  Neuro: TIA[  ];  headaches[  ];  stroke[  ];  vertigo[  ];  seizures[  ];   paresthesias[  ];  difficulty walking[  ];  Psych:depression[  ]; anxiety[  ];  Endocrine: diabetes[Y  ];  thyroid dysfunction[  ];  Other:  Past Medical History:  Diagnosis Date  . Arthritis   . Automatic implantable cardioverter-defibrillator in situ   . Benign essential HTN 09/17/2016  . COPD (chronic obstructive pulmonary disease) ([place])    emphysema by CXR  . Coronary artery disease     LAD a 60-70% stenosis, moderate size second diagonal with 50% stenosis, circumflex AV groove 75-80% stenosis with a branching obtuse marginal with a superior branch subtotal stenosis followed by diffuse disease, right coronary artery  had nonobstructive disease.  EF was 35%  . Diabetes mellitus    type 2  . Diverticulosis of colon   . GERD (gastroesophageal reflux disease)    with esophagitis  . History of colonic polyps   .  Hypertension   . Non-ischemic cardiomyopathy ([place])   . PVD (peripheral vascular disease) ([place])    bilateral common iliac artery aneurysms. Right SFA occlusion over a long segment. Left SFA disease with occlusion of the left TP trunk. Artirogram Oct. 2006  . Shortness of breath    exertion  . Sleep apnea   . Tobacco abuse   . Urinary incontinence    detrussor instability   No Known Allergies  Social History   Socioeconomic History  . Marital status: Married    Spouse name: Not on file  . Number of children: 2  . Years of education: Not on file  . Highest education level: Not on file  Social Needs  . Financial resource strain: Not on file  . Food insecurity - worry: Not on file  . Food insecurity - inability: Not on file  . Transportation needs - medical: Not on file  . Transportation needs - non-medical: Not on file  Occupational History  . Occupation: Grave digger--now disabled  Tobacco Use  . Smoking status: Current Every Day Smoker    Packs/day: 2.00    Years: 15.00    Pack years: 30.00    Types: Cigarettes  . Smokeless tobacco: Never Used  . Tobacco comment: GAVE 1-800-QUIT-NOW  Substance and Sexual Activity  . Alcohol use: No  . Drug use: No  . Sexual activity: No  Other Topics Concern  . Not on file  Social History Narrative   No living will   Requests wife as health care POA   Would accept resuscitation but doesn't want prolonged ventilation   No tube feeds if cognitively unaware   Family History  Problem Relation Age of Onset  . Stroke Father   . Coronary artery disease Maternal Aunt   . Heart failure Maternal Aunt   . Lung cancer Maternal Aunt   . Lung cancer Maternal Uncle   . Cancer Brother        Mouth  . Cancer Sister        throat   PHYSICAL EXAM: Vitals:   01/09/18 2343 01/09/18 2345  BP: 94/62 106/75  Pulse: 64 65  Resp: 18 18  Temp: (!) 97.4 F (36.3 C)   SpO2: 97% 97%   General:  Well appearing. No respiratory difficulty HEENT:  normal Neck: supple. no JVD.  Cor: PMI nondisplaced. RRR, S1,S2, No murmurs appreciated. Diminished R radial pulses, but 2+ DP pulses on the L and 1+ on the right Lungs: clear Abdomen: soft, nontender, distended. No hepatosplenomegaly. No bruits or masses. Good bowel sounds. Extremities: no cyanosis, clubbing, rash, edema Neuro: alert & oriented x 3, cranial nerves grossly intact. moves all 4 extremities w/o difficulty. Affect pleasant.  ECG:  Results for orders placed or performed during the hospital encounter of 01/09/18 (from the past 24 hour(s))  CBC with Differential/Platelet     Status: Abnormal (Preliminary result)   Collection Time: 01/09/18 11:23 PM  Result Value Ref Range   WBC 13.6 (H) 4.0 - 10.5 K/uL   RBC 5.25 4.22 - 5.81 MIL/uL   Hemoglobin 15.4 13.0 - 17.0 g/dL   HCT 43.2 39.0 - 52.0 %   MCV 82.3 78.0 - 100.0 fL   MCH 29.3 26.0 - 34.0 pg   MCHC 35.6 30.0 - 36.0 g/dL   RDW 13.6 11.5 - 15.5 %   Platelets 274 150 - 400 K/uL   Neutrophils Relative % PENDING %   Neutro Abs PENDING 1.7 - 7.7 K/uL   Band Neutrophils PENDING %   Lymphocytes Relative PENDING %   Lymphs Abs PENDING 0.7 - 4.0 K/uL   Monocytes Relative PENDING %   Monocytes Absolute PENDING 0.1 - 1.0 K/uL   Eosinophils Relative PENDING %   Eosinophils Absolute PENDING 0.0 - 0.7 K/uL   Basophils Relative PENDING %   Basophils Absolute PENDING 0.0 - 0.1 K/uL   WBC Morphology PENDING    RBC Morphology PENDING    Smear Review PENDING    nRBC PENDING 0 /100 WBC   Metamyelocytes Relative PENDING %   Myelocytes PENDING %   Promyelocytes Absolute PENDING %   Blasts PENDING %  I-stat troponin, ED     Status: None   Collection Time: 01/09/18 11:35 PM  Result Value Ref Range   Troponin i, poc 0.02 0.00 - 0.08 ng/mL   Comment 3           Dg Chest Port 1 View  Result Date: 01/09/2018 CLINICAL DATA:  Chest pain and shortness of breath EXAM: PORTABLE CHEST 1 VIEW COMPARISON:  Chest radiograph 03/02/2015  FINDINGS: Mild interstitial pulmonary edema. Mild cardiomegaly. No focal consolidation. No pleural effusion or pneumothorax. IMPRESSION: Mild cardiomegaly and mild interstitial edema. Electronically Signed   By: Ulyses Jarred M.D.   On: 01/09/2018 23:48   ASSESSMENT:  Inferior ST elevation MI Hx of CAD with 60-70% LAD lesion, 50% diagonal, 75-80% Circ (2012) Hx of presumable non-ischemic cardiomyopathy with improvement of EF to 55% on echo from 04/2017 HTN, HLP, PVD, Type 2 DM, Obesity, Carotid artery stenosis Abdominal aortic aneurysm w/o rupture  PLAN/DISCUSSION:  Patient was taken to the cath lab for a coronary angiogram and was found to have an occluded mRCA and mLcx and received 2 DES stents. Patient will need a high intensity statin, he received a full dose asa in the ER and was started on a heparin gtt. Will start 81mg  of asa tomorrow and brilinta 90mg , BID. He will get an Echo in the AM. Will continue to trend troponin's. Will hold all home BP meds and initiate 3.125mg  BID of coreg. Will hold on diuresis at this time  as LVEDP is 20mmHg. Did reiterate the importance of smoking cessation.  Cardiology fellow  [facility]

## 2018-01-10 NOTE — ED Notes (Addendum)
Dr. Gwenlyn Found arrived and explained plan of care to pt.

## 2018-01-10 NOTE — ED Notes (Signed)
Patient transported to cath lab

## 2018-01-11 ENCOUNTER — Encounter (HOSPITAL_COMMUNITY): Payer: Self-pay

## 2018-01-11 ENCOUNTER — Inpatient Hospital Stay (HOSPITAL_COMMUNITY): Payer: BLUE CROSS/BLUE SHIELD

## 2018-01-11 DIAGNOSIS — I2111 ST elevation (STEMI) myocardial infarction involving right coronary artery: Secondary | ICD-10-CM

## 2018-01-11 DIAGNOSIS — I255 Ischemic cardiomyopathy: Secondary | ICD-10-CM

## 2018-01-11 DIAGNOSIS — I714 Abdominal aortic aneurysm, without rupture: Secondary | ICD-10-CM

## 2018-01-11 LAB — GLUCOSE, CAPILLARY
GLUCOSE-CAPILLARY: 113 mg/dL — AB (ref 65–99)
GLUCOSE-CAPILLARY: 114 mg/dL — AB (ref 65–99)
GLUCOSE-CAPILLARY: 88 mg/dL (ref 65–99)
Glucose-Capillary: 113 mg/dL — ABNORMAL HIGH (ref 65–99)

## 2018-01-11 LAB — TROPONIN I: Troponin I: 9.15 ng/mL (ref <0.03)

## 2018-01-11 LAB — ECHOCARDIOGRAM COMPLETE
Height: 71 in
Weight: 4240 oz

## 2018-01-11 NOTE — Progress Notes (Signed)
  Echocardiogram 2D Echocardiogram has been performed.  [name] T Etoy Mcdonnell 01/11/2018, 3:42 PM

## 2018-01-11 NOTE — Progress Notes (Signed)
Cardiology Rounding Note   Subjective:     62 yo patient with obesity, tobacco abuse,  DM2, PAD, CAD iCM EF 35% (s/p ICD but EOL) admitted 1/19 with inferior STEMI.   Cath with:  LAD 50% LCx mid 100% s/p PCI with DES RCA mid 100% s/p PCI with DES (culprit) EF 20-25%   Large AAA noted.    Feels good this am. A,mbulated hall without CP.  SBPs in 90 this am. Now 120. HgBA1c 6.1  TGs 685   Objective:   Weight Range:  Vital Signs:   Temp:  [97.8 F (36.6 C)-98.7 F (37.1 C)] 98.2 F (36.8 C) (01/20 1100) Pulse Rate:  [63-87] 77 (01/20 0834) Resp:  [13-24] 18 (01/20 0700) BP: (75-129)/(44-82) 121/68 (01/20 0834) SpO2:  [89 %-96 %] 95 % (01/20 0700) Last BM Date: 01/09/18  Weight change: Filed Weights   01/09/18 2321  Weight: 120.2 kg (265 lb)    Intake/Output:   Intake/Output Summary (Last 24 hours) at 01/11/2018 1236 Last data filed at 01/11/2018 0835 Gross per 24 hour  Intake 2295 ml  Output 1330 ml  Net 965 ml     Physical Exam: General:  Well appearing. No resp difficulty HEENT: normal Neck: supple. JVP flat . Carotids 2+ bilat; no bruits. No lymphadenopathy or thryomegaly appreciated. Cor: PMI nondisplaced. Regular rate & rhythm. No rubs, gallops or murmurs. Lungs: clear Abdomen: obese soft, nontender, nondistended. No hepatosplenomegaly. No bruits or masses. Good bowel sounds. Extremities: no cyanosis, clubbing, rash, edema Neuro: alert & orientedx3, cranial nerves grossly intact. moves all 4 extremities w/o difficulty. Affect pleasant  Telemetry: NSR 60-70s Personally reviewed   Labs: Basic Metabolic Panel: Recent Labs  Lab 01/09/18 2323 01/10/18 1134  NA 133* 136  K 3.9 4.1  CL 99* 101  CO2 21* 24  GLUCOSE 232* 111*  BUN 10 8  CREATININE 0.90 0.83  CALCIUM 8.5* 8.8*    Liver Function Tests: Recent Labs  Lab 01/09/18 2323  AST 25  ALT 18  ALKPHOS 79  BILITOT 0.8  PROT 6.3*  ALBUMIN 3.6   Recent Labs  Lab  01/09/18 2323  LIPASE 32   No results for input(s): AMMONIA in the last 168 hours.  CBC: Recent Labs  Lab 01/09/18 2323 01/10/18 1134  WBC 13.6* 9.1  NEUTROABS 6.3  --   HGB 15.4 14.5  HCT 43.2 41.7  MCV 82.3 82.4  PLT 274 229    Cardiac Enzymes: Recent Labs  Lab 01/10/18 0123 01/11/18 0314  TROPONINI 4.48* 9.15*    BNP: BNP (last 3 results) Recent Labs    01/09/18 2323  BNP 26.1    ProBNP (last 3 results) No results for input(s): PROBNP in the last 8760 hours.    Other results:  Imaging: Dg Chest Port 1 View  Result Date: 01/09/2018 CLINICAL DATA:  Chest pain and shortness of breath EXAM: PORTABLE CHEST 1 VIEW COMPARISON:  Chest radiograph 03/02/2015 FINDINGS: Mild interstitial pulmonary edema. Mild cardiomegaly. No focal consolidation. No pleural effusion or pneumothorax. IMPRESSION: Mild cardiomegaly and mild interstitial edema. Electronically Signed   By: Ulyses Jarred M.D.   On: 01/09/2018 23:48      Medications:     Scheduled Medications: . aspirin  81 mg Oral Daily  . atorvastatin  80 mg Oral q1800  . carvedilol  3.125 mg Oral BID WC  . losartan  25 mg Oral Daily  . sodium chloride flush  3 mL Intravenous Q12H  . spironolactone  12.5 mg Oral Daily  . ticagrelor  90 mg Oral BID     Infusions: . sodium chloride Stopped (01/10/18 2000)  . sodium chloride 250 mL (01/10/18 1807)     PRN Medications:  sodium chloride, acetaminophen, morphine injection, ondansetron (ZOFRAN) IV, sodium chloride flush   Assessment:    62 yo patient with obesity, tobacco abuse,  DM2, PAD, CAD iCM EF 35% (s/p ICD but EOL) admittedwith inferior STEMI.    Plan/Discussion:     1. CAD with acute inferior STEMI - s/p PCI/DES of LCX and RCA on 1/19 - continues DAPT and statin and b-blocker - CR   2. Ischemic CM - EF 25% Echo pending (EF 5/18 was 50-55%)  - No overt HF - On low-dose b-blocker, ARB and sprio. Continue as tolerated. Add lasix as needed.    3. PAD with large AAA - will need dedicated CT. If renal function stable can order tomorrow (want to give time between contrast exposure)   4. HL - TGs 585 on admit. Fenofibrate held. High-dose statin started. Encouraged diet and exercise modification. Consider Vascepa  5. Tobacco abuse - counseled on need for cessation   6. Morbid obesity - needs to lose weight. Suggested low-carb diet.   Can go to Tele.    Length of Stay: 1   Glori Bickers MD 01/11/2018, 12:36 PM Advanced Heart Failure Team Pager [phone] (M-F; [place])  Please contact [facility] for night-coverage after hours (4p -7a ) and weekends on [email or web address]

## 2018-01-11 NOTE — Progress Notes (Signed)
Inpatient Diabetes Program Recommendations  AACE/ADA: New Consensus Statement on Inpatient Glycemic Control (2015)  Target Ranges:  Prepandial:   less than 140 mg/dL      Peak postprandial:   less than 180 mg/dL (1-2 hours)      Critically ill patients:  140 - 180 mg/dL   Results for BURLEIGH, BROCKMANN (MRN [id number]) as of 01/11/2018 09:41  Ref. Range 01/10/2018 11:59 01/10/2018 16:46 01/10/2018 23:06 01/11/2018 07:58  Glucose-Capillary Latest Ref Range: 65 - 99 mg/dL 110 (H) 102 (H) 108 (H) 113 (H)  Results for RESHAD, SAAB (MRN [id number]) as of 01/11/2018 09:41  Ref. Range 01/10/2018 17:11  Hemoglobin A1C Latest Ref Range: 4.8 - 5.6 % 6.1 (H)   Review of Glycemic Control  Diabetes history: DM2 Outpatient Diabetes medications: None Current orders for Inpatient glycemic control: None  Inpatient Diabetes Program Recommendations: Correction (SSI): While inpatient, please consider ordering CBGs with Novolog 0-9 units TID with meals and Novolog 0-5 units QHS.  NOTE: Noted consult for Diabetes Coordinator. Diabetes Coordinator is not on campus over the weekend but available by pager for questions from 8am to 5pm daily. Chart reviewed. Patient has a history of DM2 and not on any DM medications as an outpatient. A1C 6.1% on 01/10/18 indicating good glycemic control over the past 2-3 months. CBGs 102-113 mg/dl since arriving at the hospital. Recommend ordering CBGs and Novolog correction scale (in case needed for any elevation of glucose while inpatient). Will follow.  Thanks, Barnie Alderman, RN, MSN, CDE Diabetes Coordinator Inpatient Diabetes Program [phone] (Team Pager from 8am to 5pm)

## 2018-01-12 ENCOUNTER — Encounter (HOSPITAL_COMMUNITY): Payer: Self-pay | Admitting: Cardiovascular Disease

## 2018-01-12 LAB — POCT I-STAT, CHEM 8
BUN: 11 mg/dL (ref 6–20)
CHLORIDE: 99 mmol/L — AB (ref 101–111)
CREATININE: 0.7 mg/dL (ref 0.61–1.24)
Calcium, Ion: 1.14 mmol/L — ABNORMAL LOW (ref 1.15–1.40)
GLUCOSE: 244 mg/dL — AB (ref 65–99)
HCT: 42 % (ref 39.0–52.0)
HEMOGLOBIN: 14.3 g/dL (ref 13.0–17.0)
POTASSIUM: 3.5 mmol/L (ref 3.5–5.1)
Sodium: 136 mmol/L (ref 135–145)
TCO2: 23 mmol/L (ref 22–32)

## 2018-01-12 LAB — GLUCOSE, CAPILLARY
GLUCOSE-CAPILLARY: 120 mg/dL — AB (ref 65–99)
GLUCOSE-CAPILLARY: 94 mg/dL (ref 65–99)
GLUCOSE-CAPILLARY: 111 mg/dL — AB (ref 65–99)
Glucose-Capillary: 103 mg/dL — ABNORMAL HIGH (ref 65–99)

## 2018-01-12 LAB — POCT ACTIVATED CLOTTING TIME: Activated Clotting Time: 521 seconds

## 2018-01-12 LAB — PATHOLOGIST SMEAR REVIEW: PATH REVIEW: REACTIVE

## 2018-01-12 MED FILL — Nitroglycerin IV Soln 100 MCG/ML in D5W: INTRA_ARTERIAL | Qty: 10 | Status: AC

## 2018-01-12 NOTE — Progress Notes (Signed)
CARDIAC REHAB PHASE I   PRE:  Rate/Rhythm: 68 SR    BP: sitting 101/70    SaO2: 93 RA  MODE:  Ambulation: 210 ft   POST:  Rate/Rhythm: 92 SR    BP: sitting 120/77     SaO2: 96 RA  Tolerated fairly well, no specific c/o. Slightly wobbly walking. Sts this is about the distance he normally walks due to PVD. Discussed with pt and wife HF, increasing ambulation, Brilinta, low sodium, and CRPII. Voiced understanding. They have materials. [place], ACSM 01/12/2018 12:04 PM

## 2018-01-12 NOTE — Progress Notes (Addendum)
Progress Note  Patient Name: [name] Date of Encounter: 01/12/2018  Primary Cardiologist:  Hochrein  Subjective   Feels better today.  Status post STEMI intervention on the right coronary performed on 2018-01-17.  No recurrence of chest discomfort.  Has residual LAD disease.  Inpatient Medications    Scheduled Meds: . aspirin  81 mg Oral Daily  . atorvastatin  80 mg Oral q1800  . carvedilol  3.125 mg Oral BID WC  . losartan  25 mg Oral Daily  . sodium chloride flush  3 mL Intravenous Q12H  . spironolactone  12.5 mg Oral Daily  . ticagrelor  90 mg Oral BID   Continuous Infusions: . sodium chloride Stopped (2018/01/17 2000)  . sodium chloride 250 mL (01/17/18 1807)   PRN Meds: sodium chloride, acetaminophen, morphine injection, ondansetron (ZOFRAN) IV, sodium chloride flush   Vital Signs    Vitals:   01/11/18 2300 01/12/18 0000 01/12/18 0010 01/12/18 0400  BP:  (!) 99/45 (!) 104/54 108/76  Pulse: 61 60 64 61  Resp: 15 10 16 14   Temp:  98.3 F (36.8 C)  98.1 F (36.7 C)  TempSrc:  Oral  Oral  SpO2: 94% 93% 92% 95%  Weight:      Height:        Intake/Output Summary (Last 24 hours) at 01/12/2018 0749 Last data filed at 01/12/2018 0657 Gross per 24 hour  Intake 2040 ml  Output 2850 ml  Net -810 ml   Filed Weights   01/09/18 2321  Weight: 265 lb (120.2 kg)    Telemetry    NSR - Personally Reviewed  ECG    17-Jan-2018 revealed normal sinus rhythm with small inferior Q waves and prominent voltage.- Personally Reviewed  Physical Exam  Obese GEN: No acute distress.   Neck: No JVD Cardiac: RRR, no murmurs, rubs, or gallops.  Respiratory: Clear to auscultation bilaterally. GI: Soft, nontender, non-distended  MS: No edema; No deformity. Neuro:  Nonfocal  Psych: Normal affect   Labs    Chemistry Recent Labs  Lab 01/09/18 2323 01-17-2018 1134  NA 133* 136  K 3.9 4.1  CL 99* 101  CO2 21* 24  GLUCOSE 232* 111*  BUN 10 8  CREATININE 0.90 0.83    CALCIUM 8.5* 8.8*  PROT 6.3*  --   ALBUMIN 3.6  --   AST 25  --   ALT 18  --   ALKPHOS 79  --   BILITOT 0.8  --   GFRNONAA >60 >60  GFRAA >60 >60  ANIONGAP 13 11     Hematology Recent Labs  Lab 01/09/18 2323 01/17/2018 1134  WBC 13.6* 9.1  RBC 5.25 5.06  HGB 15.4 14.5  HCT 43.2 41.7  MCV 82.3 82.4  MCH 29.3 28.7  MCHC 35.6 34.8  RDW 13.6 13.9  PLT 274 229    Cardiac Enzymes Recent Labs  Lab 01/17/18 0123 01/11/18 0314  TROPONINI 4.48* 9.15*    Recent Labs  Lab 01/09/18 2335  TROPIPOC 0.02     BNP Recent Labs  Lab 01/09/18 2323  BNP 26.1     DDimer No results for input(s): DDIMER in the last 168 hours.   Radiology    No results found.  Cardiac Studies   Cardiac cath  findings 17-Jan-2018: Coronary Diagrams   Diagnostic Diagram       Post-Intervention Diagram       LAD 50%but ? Up to 80% by my eye. LCx mid 100% s/p PCI  with DES RCA mid 100% s/p PCI with DES(culprit) EF 20-25%    EF 35% by echo  Patient Profile     62 y.o. male with hyperlipidemia, type 2 diabetes, hypertension, PAD, tobacco abuse, internal cardiac defibrillator for pre-existing LV systolic dysfunction who presented with inferior ST elevation myocardial infarction on 01/10/2018 and was treated with interventional therapy by Dr. Quay Burow reducing total occlusion of the mid RCA to 0% with TIMI grade III flow.  Assessment & Plan    1.  Acute inferior myocardial infarction, ST elevation MI.  The patient had acute intervention with stenting of the mid RCA and the chronically occluded circumflex.  Still has residual significant proximal LAD and diffuse diagonal disease.  Probably needs to have the LAD territory intervened upon once he recovers from the current acute event. 2.  Large abdominal aortic aneurysm and known PAD, needs noninvasive sizing and clinical follow-up. 3.  Acute on chronic systolic heart failure, overt evidence of heart failure currently.  Titrate heart  failure therapy as tolerated by blood pressure. 4.  Diabetes mellitus, type II 5.  Internal cardiac defibrillator, at EOL.  Phase 1 cardiac rehab.  Abdominal aortic duplex versus CT to assess aneurysm as OP.  Discuss pictures with interventional team to determine if percutaneous intervention staged within the next month after recovery from this infarction would be appropriate. Plan transfer to telemetry.  Guarded overall prognosis.  For questions or updates, please contact [facility] Please consult [email or web address] for contact info under Cardiology/STEMI.      Signed, Sinclair Grooms, MD  01/12/2018, 7:49 AM

## 2018-01-12 NOTE — Care Management Note (Addendum)
Case Management Note  Patient Details  Name: [name] MRN: [id number] Date of Birth: [birth year]  Subjective/Objective:  From home,pta indep s/p coronary stent intervention, will be on brilinta, NCM awaiting benefit check for brilinta. NCM gave patient the 30 day savings card and his co pay of 50.00, MD please give patient a paper script , his pharmacy does not have brilinta in stock , he will take paper script to a local pharmacy.                 Action/Plan: NCM awaiting benefit check for brilinta.  Expected Discharge Date:                  Expected Discharge Plan:  Home/Self Care  In-House Referral:     Discharge planning Services  CM Consult  Post Acute Care Choice:    Choice offered to:     DME Arranged:    DME Agency:     HH Arranged:    HH Agency:     Status of Service:  In process, will continue to follow  If discussed at Long Length of Stay Meetings, dates discussed:    Additional Comments:  Zenon Mayo, RN 01/12/2018, 11:01 AM

## 2018-01-12 NOTE — Progress Notes (Signed)
4.  S/W TONYA @ [facility] # [phone]    BRILINTA 90 MG BID   COVER- YES  CO-PAY- $ 50.00  TIER- 2 DRUG  PRIOR APPROVAL- NO   NO DEDUCTIBLE   PREFERRED PHARMACY : [facility]

## 2018-01-12 NOTE — Research (Signed)
Spoke with patient and Family about possible candidate for the AEGIS II research study. Patient has history of CHF not certain of NYHA Class. Ef 25 % on cath increased to 40% on echo. Patient encouraged questions and answered. ICF left for review. I will return tomorrow for questions and follow up.

## 2018-01-12 NOTE — Progress Notes (Signed)
Report called to 3E

## 2018-01-13 ENCOUNTER — Other Ambulatory Visit: Payer: Self-pay | Admitting: Cardiology

## 2018-01-13 ENCOUNTER — Encounter (HOSPITAL_COMMUNITY): Payer: Self-pay | Admitting: *Deleted

## 2018-01-13 DIAGNOSIS — I25110 Atherosclerotic heart disease of native coronary artery with unstable angina pectoris: Secondary | ICD-10-CM

## 2018-01-13 DIAGNOSIS — Z955 Presence of coronary angioplasty implant and graft: Secondary | ICD-10-CM

## 2018-01-13 LAB — GLUCOSE, CAPILLARY
GLUCOSE-CAPILLARY: 106 mg/dL — AB (ref 65–99)
Glucose-Capillary: 108 mg/dL — ABNORMAL HIGH (ref 65–99)

## 2018-01-13 MED ORDER — TICAGRELOR 90 MG PO TABS: 90.0000 mg | ORAL_TABLET | Freq: Two times a day (BID) | ORAL | 2 refills | Status: DC

## 2018-01-13 MED ORDER — NITROGLYCERIN 0.4 MG SL SUBL: 0.4000 mg | SUBLINGUAL_TABLET | SUBLINGUAL | 2 refills | Status: DC | PRN

## 2018-01-13 MED ORDER — LOSARTAN POTASSIUM 25 MG PO TABS: 25.0000 mg | ORAL_TABLET | Freq: Every day | ORAL | 2 refills | Status: DC

## 2018-01-13 MED ORDER — CARVEDILOL 3.125 MG PO TABS: 3.1250 mg | ORAL_TABLET | Freq: Two times a day (BID) | ORAL | 2 refills | Status: DC

## 2018-01-13 MED ORDER — ATORVASTATIN CALCIUM 80 MG PO TABS: 80.0000 mg | ORAL_TABLET | Freq: Every day | ORAL | 0 refills | Status: DC

## 2018-01-13 MED ORDER — SPIRONOLACTONE 25 MG PO TABS: 12.5000 mg | ORAL_TABLET | Freq: Every day | ORAL | 2 refills | Status: DC

## 2018-01-13 NOTE — Progress Notes (Signed)
Progress Note  Reviewed digital images with Dr.Berry.  Rather than 50% LAD there appears to be at least 70-80% LAD distal to the large septal perforator.  Patient is ablated, denies chest discomfort, and can be discharged today.  Needs 1 week Lexiscan myocardial perfusion study to look for anterior wall ischemia.  If demonstrated, should probably be said to have PCI LAD versus consideration of CABG.  We need to be certain that Dr. Percival Spanish understands our rationale for early nuclear scintigraphy.  Needs 1-2-week TOC.

## 2018-01-13 NOTE — Progress Notes (Signed)
Discharge instructions, meds and follow ups reviewed with patient and wife. Encouraged pt to follow thru with follow up appts.    Pt ambulated in hall with no difficulty and tolerated well.  Pt demonstrates no distress  Wife to provide transportation to home.

## 2018-01-13 NOTE — Discharge Summary (Signed)
The patient has been seen in conjunction with Reino Bellis, [facility]. All aspects of care have been considered and discussed. The patient has been personally interviewed, examined, and all clinical data has been reviewed.   Please see my note from earlier in the day.  Discharge Summary    Patient ID: [name],  MRN: [id number], DOB/AGE: 12/23/1956 62 y.o.  Admit date: 01/09/2018 Discharge date: 01/13/2018  Primary Care Provider: Viviana Simpler I Primary Cardiologist: Hochrein   Discharge Diagnoses    Active Problems:   CAD (coronary artery disease), native coronary artery   Acute ST elevation myocardial infarction (STEMI) involving right coronary artery [facility])   AAA (abdominal aortic aneurysm) without rupture [facility])   Status post coronary artery stent placement   Allergies No Known Allergies  Diagnostic Studies/Procedures    Cath: 01/10/18  Conclusion     Mid RCA lesion is 100% stenosed.  A stent was successfully placed.  Post intervention, there is a 0% residual stenosis.  Prox Cx to Mid Cx lesion is 100% stenosed.  Ost LAD to Prox LAD lesion is 50% stenosed.  A stent was successfully placed.  Post intervention, there is a 0% residual stenosis.  There is severe left ventricular systolic dysfunction.  LV end diastolic pressure is moderately elevated.  The left ventricular ejection fraction is less than 25% by visual estimate.   IMPRESSION: Successful PCI and drug-eluting stenting of the mid dominant RCA which is most likely the culprit lesion as well as the mid AV groove circumflex was synergy drug-eluting stents. The door to balloon time was 28 minutes. He has severe LV dysfunction with a moderately dilated left ventricle and an EF of 20-25%. He also has a large infrarenal abdominal aortic aneurysm extending into the iliac arteries. Angiomax will continue for 4 hours for this and that will be discontinued. The sheath will be removed and pressure held  several hours after that. He'll need to continue dual antiplatelet therapy with aspirin and Plavix for a minimum of 12 months. A 2-D echocardiogram will be obtained. The patient left the lab in stable condition.  Quay Burow. MD, [facility]  TTE: 01/11/18  Study Conclusions  - Left ventricle: The cavity size was normal. There was severe   concentric hypertrophy. Systolic function was mildly to   moderately reduced. The estimated ejection fraction was in the   range of 40% to 45%. Diffuse hypokinesis. Doppler parameters are   consistent with abnormal left ventricular relaxation (grade 1   diastolic dysfunction). LV filling pressure appears elevated.   Ejection fraction (MOD, 2-plane): 41%. - Aortic valve: Sclerosis without stenosis. There was no   regurgitation. - Mitral valve: Mildly thickened leaflets . There was trivial   regurgitation. - Left atrium: The atrium was normal in size. - Inferior vena cava: The vessel was normal in size. The   respirophasic diameter changes were in the normal range (>= 50%),   consistent with normal central venous pressure.  Impressions:  - Compared to a prior study in 04/2017, the LVEF has reduced from   50-55% down to 40-45% with global hypokinesis. _____________   History of Present Illness     62 y/o who follows with Dr. Percival Spanish with hx of CAD, PVD, Type 2 DM, HTN, HLP, Non-ischemic cardiomyopathy s/p improvement to 55% (no replacement ICD after ERI) who presented complaining of sudden onset of chest pain that started 1 hr ago. Pain was left sided, reproducible, burning in nature. Pain radiated down the L  arm and he had associated numbness in the left arm. He was given a sublingual NTG in transit, but this didn't improve his pain. On arriving to the hospital he had a NTG patch placed, which dropped his BP's in to the MAP's of low 60's. The initial EKG also looked as if patient had inferior ST elevations. Cardiology was consulted for further  recommendations. On repeating EKG, there was no evidence of these ST elevations, but a repeat EKG obtained showed this again. At this point, given the hypotension, NTG patch was removed and patient was started on IV saline which improved his MAP's in the the 70-80's. Initial troponin was negative, but patient continued to have refractory pain, hence, he was taken to the cath lab for further evaluation.   He also added that he's had ongoing L sided chest pain that came on suddenly the week prior to admission. He had a minimum of one episode/day, but pain resolved spontaneously after 10 minutes without intervention. Due to this pain being worse and unremitting, patient came to the hospital for further evaluation. Patient has known CAD hx in the past as indicated below, but this was managed medically. He also had a stress test (nuclear) b/c he had chest pain which showed an inferior infarct from apex to base with mild peri-infarct ischemia prior to the heart catheterization, however it as noted that if patient continued to have symptoms that revascularization would be considered.  Hospital Course     Underwent cardiac cath on 01/10/18 with successful stenting of the mRCA and occluded Lcx. Still with residual in the pLAD and diffuse diagonal disease. Plan for DAPT with ASA/Brilinta. Troponin peaked at 9.15. He was started on low dose BB, ARB and spironolactone. Trig 585 on admission, therefore was started on high dose statin. Follow up echo showed an EF of 40-45% with diffuse hypokinesis and G1DD. Noted to have a large abd aortic aneurysm that will need noninvasive testing as outpatient. His films were reviewed with interventional team and planned for outpatient stress test to determine  look for anterior wall ischemia.  If demonstrated, should probably be said to have PCI LAD versus consideration of CABG per Dr. Tamala Julian. Worked well with cardiac rehab without recurrence of chest pain.   [name] was seen by  Dr. Tamala Julian and determined stable for discharge home. Follow up in the office has been arranged. Medications are listed below.   _____________  Discharge Vitals Blood pressure 119/76, pulse 70, temperature 98 F (36.7 C), temperature source Oral, resp. rate 18, height 5\' 11"  (1.803 m), weight 264 lb 9.6 oz (120 kg), SpO2 95 %.  Filed Weights   01/09/18 2321 01/12/18 1754 01/13/18 0458  Weight: 265 lb (120.2 kg) 265 lb 11.2 oz (120.5 kg) 264 lb 9.6 oz (120 kg)    Labs & Radiologic Studies    CBC No results for input(s): WBC, NEUTROABS, HGB, HCT, MCV, PLT in the last 72 hours. Basic Metabolic Panel No results for input(s): NA, K, CL, CO2, GLUCOSE, BUN, CREATININE, CALCIUM, MG, PHOS in the last 72 hours. Liver Function Tests No results for input(s): AST, ALT, ALKPHOS, BILITOT, PROT, ALBUMIN in the last 72 hours. No results for input(s): LIPASE, AMYLASE in the last 72 hours. Cardiac Enzymes Recent Labs    01/11/18 0314  TROPONINI 9.15*   BNP Invalid input(s): POCBNP D-Dimer No results for input(s): DDIMER in the last 72 hours. Hemoglobin A1C Recent Labs    01/10/18 1711  HGBA1C 6.1*  Fasting Lipid Panel No results for input(s): CHOL, HDL, LDLCALC, TRIG, CHOLHDL, LDLDIRECT in the last 72 hours. Thyroid Function Tests No results for input(s): TSH, T4TOTAL, T3FREE, THYROIDAB in the last 72 hours.  Invalid input(s): FREET3 _____________  Dg Chest Port 1 View  Result Date: 01/09/2018 CLINICAL DATA:  Chest pain and shortness of breath EXAM: PORTABLE CHEST 1 VIEW COMPARISON:  Chest radiograph 03/02/2015 FINDINGS: Mild interstitial pulmonary edema. Mild cardiomegaly. No focal consolidation. No pleural effusion or pneumothorax. IMPRESSION: Mild cardiomegaly and mild interstitial edema. Electronically Signed   By: Ulyses Jarred M.D.   On: 01/09/2018 23:48   Disposition   Pt is being discharged home today in good condition.  Follow-up Plans & Appointments    Follow-up  Information    Minus Breeding, MD Follow up on 01/21/2018.   Specialty:  Cardiology Why:  at 3pm for your follow up appt Contact information: [place] [address] [address] Follow up on 01/21/2018.   Specialty:  Cardiology Why:  at 1pm for your follow up stress test.  Contact information: [place] [phone]         Discharge Instructions    ([place]) Call MD:  Anytime you have any of the following symptoms: 1) 3 pound weight gain in 24 hours or 5 pounds in 1 week 2) shortness of breath, with or without a dry hacking cough 3) swelling in the hands, feet or stomach 4) if you have to sleep on extra pillows at night in order to breathe.   Complete by:  As directed    AMB Referral to Cardiac Rehabilitation - Phase II   Complete by:  As directed    Diagnosis:  STEMI   Amb Referral to Cardiac Rehabilitation   Complete by:  As directed    Diagnosis:   Coronary Stents STEMI PTCA     Call MD for:  redness, tenderness, or signs of infection (pain, swelling, redness, odor or green/yellow discharge around incision site)   Complete by:  As directed    Diet - low sodium heart healthy   Complete by:  As directed    Discharge instructions   Complete by:  As directed    Groin Site Care Refer to this sheet in the next few weeks. These instructions provide you with information on caring for yourself after your procedure. Your caregiver may also give you more specific instructions. Your treatment has been planned according to current medical practices, but problems sometimes occur. Call your caregiver if you have any problems or questions after your procedure. HOME CARE INSTRUCTIONS You may shower 24 hours after the procedure. Remove the bandage (dressing) and gently wash the site with plain soap and water. Gently pat the site dry.  Do not apply powder or lotion  to the site.  Do not sit in a bathtub, swimming pool, or whirlpool for 5 to 7 days.  No bending, squatting, or lifting anything over 10 pounds (4.5 kg) as directed by your caregiver.  Inspect the site at least twice daily.  Do not drive home if you are discharged the same day of the procedure. Have someone else drive you.  You may drive 24 hours after the procedure unless otherwise instructed by your caregiver.  What to expect: Any bruising will usually fade within 1 to 2 weeks.  Blood that collects in the tissue (hematoma) may be  painful to the touch. It should usually decrease in size and tenderness within 1 to 2 weeks.  SEEK IMMEDIATE MEDICAL CARE IF: You have unusual pain at the groin site or down the affected leg.  You have redness, warmth, swelling, or pain at the groin site.  You have drainage (other than a small amount of blood on the dressing).  You have chills.  You have a fever or persistent symptoms for more than 72 hours.  You have a fever and your symptoms suddenly get worse.  Your leg becomes pale, cool, tingly, or numb.  You have heavy bleeding from the site. Hold pressure on the site. Marland Kitchen  PLEASE DO NOT MISS ANY DOSES OF YOUR BRILINTA!!!!! Also keep a log of you blood pressures and bring back to your follow up appt. Please call the office with any questions.   Patients taking blood thinners should generally stay away from medicines like ibuprofen, Advil, Motrin, naproxen, and Aleve due to risk of stomach bleeding. You may take Tylenol as directed or talk to your primary doctor about alternatives.   Increase activity slowly   Complete by:  As directed       Discharge Medications     Medication List    STOP taking these medications   bisoprolol 5 MG tablet Commonly known as:  ZEBETA   enalapril 2.5 MG tablet Commonly known as:  VASOTEC   isosorbide mononitrate 10 MG tablet Commonly known as:  ISMO,MONOKET     TAKE these medications   aspirin 81 MG  tablet Take 81 mg by mouth daily.   atorvastatin 80 MG tablet Commonly known as:  LIPITOR Take 1 tablet (80 mg total) by mouth daily at 6 PM.   carvedilol 3.125 MG tablet Commonly known as:  COREG Take 1 tablet (3.125 mg total) by mouth 2 (two) times daily with a meal.   losartan 25 MG tablet Commonly known as:  COZAAR Take 1 tablet (25 mg total) by mouth daily. Start taking on:  01/14/2018   multivitamin per tablet Take 1 tablet by mouth daily.   nitroGLYCERIN 0.4 MG SL tablet Commonly known as:  NITROSTAT Place 1 tablet (0.4 mg total) under the tongue every 5 (five) minutes as needed.   spironolactone 25 MG tablet Commonly known as:  ALDACTONE Take 0.5 tablets (12.5 mg total) by mouth daily. Start taking on:  01/14/2018   ticagrelor 90 MG Tabs tablet Commonly known as:  BRILINTA Take 1 tablet (90 mg total) by mouth 2 (two) times daily.   vitamin B-12 1000 MCG tablet Commonly known as:  CYANOCOBALAMIN Take 1,000 mcg by mouth daily.        Aspirin prescribed at discharge?  Yes High Intensity Statin Prescribed? (Lipitor 40-80mg  or Crestor 20-40mg ): Yes Beta Blocker Prescribed? Yes For EF <40%, was ACEI/ARB Prescribed? Yes ADP Receptor Inhibitor Prescribed? (i.e. Plavix etc.-Includes Medically Managed Patients): Yes For EF <40%, Aldosterone Inhibitor Prescribed? Yes Was EF assessed during THIS hospitalization? Yes Was Cardiac Rehab II ordered? (Included Medically managed Patients): Yes   Outstanding Labs/Studies   FLP/LFTs in 6 weeks. BMET at follow up appt. Outpt stress test ordered to to look for anterior wall ischemia.  Duration of Discharge Encounter   Greater than 30 minutes including physician time.  Signed, Reino Bellis NP-C 01/13/2018, 12:42 PM

## 2018-01-13 NOTE — Progress Notes (Signed)
Pt without bowel movement since 01/09/18. Pt refusing med for constipation, stating that he hasn't had a bowel movement recently because he has not been eating solid food until 01/12/18. Will continue to monitor.

## 2018-01-13 NOTE — Progress Notes (Signed)
CARDIAC REHAB PHASE I   PRE:  Rate/Rhythm: 68 SR    BP: sitting 125/70    SaO2:   MODE:  Ambulation: 190 ft   POST:  Rate/Rhythm: 90 SR with PVC    BP: sitting 128/88     SaO2:   [place] today. No CP. Sts his legs start getting weak and tight at about this distance. Encouraged numerous short walks at home. Also encouraged setting a chair in the driveway and resting then walking again. He knows to be watchful of angina in the next week or so before Lexiscan. Good reception. Not going to smoke. [place], ACSM 01/13/2018 11:39 AM

## 2018-01-13 NOTE — Plan of Care (Signed)
Education: Knowledge of General Education information will improve 01/13/2018 0734 - Progressing by Lavonna Rua, RN 01/12/2018 1744 - Progressing by Lavonna Rua, RN   Health Behavior/Discharge Planning: Ability to manage health-related needs will improve 01/13/2018 0734 - Progressing by Lavonna Rua, RN 01/12/2018 1744 - Progressing by Lavonna Rua, RN   Clinical Measurements: Ability to maintain clinical measurements within normal limits will improve 01/13/2018 0734 - Progressing by Lavonna Rua, RN 01/12/2018 1744 - Progressing by Lavonna Rua, RN Will remain free from infection 01/13/2018 0734 - Progressing by Lavonna Rua, RN 01/12/2018 1744 - Progressing by Lavonna Rua, RN Diagnostic test results will improve 01/13/2018 0734 - Progressing by Lavonna Rua, RN 01/12/2018 1744 - Progressing by Lavonna Rua, RN Respiratory complications will improve 01/13/2018 0734 - Progressing by Lavonna Rua, RN 01/12/2018 1744 - Progressing by Lavonna Rua, RN Cardiovascular complication will be avoided 01/13/2018 0734 - Progressing by Lavonna Rua, RN 01/12/2018 1744 - Progressing by Lavonna Rua, RN   Activity: Risk for activity intolerance will decrease 01/13/2018 0734 - Progressing by Lavonna Rua, RN 01/12/2018 1744 - Progressing by Lavonna Rua, RN   Nutrition: Adequate nutrition will be maintained 01/13/2018 0734 - Progressing by Lavonna Rua, RN 01/12/2018 1744 - Progressing by Lavonna Rua, RN   Coping: Level of anxiety will decrease 01/13/2018 0734 - Progressing by Lavonna Rua, RN 01/12/2018 1744 - Progressing by Lavonna Rua, RN   Elimination: Will not experience complications related to bowel motility 01/13/2018 0734 - Progressing by Lavonna Rua, RN 01/12/2018 1744 - Progressing by Lavonna Rua, RN Will not experience complications related to urinary retention 01/13/2018 0734 - Progressing by Lavonna Rua, RN 01/12/2018 1744 - Progressing  by Lavonna Rua, RN   Pain Managment: General experience of comfort will improve 01/13/2018 0734 - Progressing by Lavonna Rua, RN 01/12/2018 1744 - Progressing by Lavonna Rua, RN   Safety: Ability to remain free from injury will improve 01/13/2018 0734 - Progressing by Lavonna Rua, RN 01/12/2018 1744 - Progressing by Lavonna Rua, RN   Skin Integrity: Risk for impaired skin integrity will decrease 01/13/2018 0734 - Progressing by Lavonna Rua, RN 01/12/2018 1744 - Progressing by Lavonna Rua, RN   Education: Understanding of CV disease, CV risk reduction, and recovery process will improve 01/13/2018 0734 - Progressing by Lavonna Rua, RN 01/12/2018 1744 - Progressing by Lavonna Rua, RN   Activity: Ability to return to baseline activity level will improve 01/13/2018 0734 - Progressing by Lavonna Rua, RN 01/12/2018 1744 - Progressing by Lavonna Rua, RN   Cardiovascular: Ability to achieve and maintain adequate cardiovascular perfusion will improve 01/13/2018 0734 - Progressing by Lavonna Rua, RN 01/12/2018 1744 - Progressing by Lavonna Rua, RN Vascular access site(s) Level 0-1 will be maintained 01/13/2018 0734 - Progressing by Lavonna Rua, RN 01/12/2018 1744 - Progressing by Lavonna Rua, RN   Health Behavior/Discharge Planning: Ability to safely manage health-related needs after discharge will improve 01/13/2018 0734 - Progressing by Lavonna Rua, RN 01/12/2018 1744 - Progressing by Lavonna Rua, RN   Education: Understanding of cardiac disease, CV risk reduction, and recovery process will improve 01/13/2018 0734 - Progressing by Lavonna Rua, RN 01/12/2018 1744 - Progressing by Lavonna Rua, RN Understanding of medication regimen will improve 01/13/2018 0734 - Progressing by Lavonna Rua,  RN 01/12/2018 1744 - Progressing by Lavonna Rua, RN   Activity: Ability to tolerate increased activity will improve 01/13/2018 0734 - Progressing by  Lavonna Rua, RN 01/12/2018 1744 - Progressing by Lavonna Rua, RN   Cardiac: Ability to achieve and maintain adequate cardiopulmonary perfusion will improve 01/13/2018 0734 - Progressing by Lavonna Rua, RN 01/12/2018 1744 - Progressing by Lavonna Rua, RN Vascular access site(s) Level 0-1 will be maintained 01/13/2018 0734 - Progressing by Lavonna Rua, RN 01/12/2018 1744 - Progressing by Lavonna Rua, RN   Health Behavior/Discharge Planning: Ability to safely manage health-related needs after discharge will improve 01/13/2018 0734 - Progressing by Lavonna Rua, RN 01/12/2018 1744 - Progressing by Lavonna Rua, RN

## 2018-01-14 ENCOUNTER — Telehealth: Payer: Self-pay | Admitting: Cardiology

## 2018-01-14 NOTE — Telephone Encounter (Signed)
Patient wife (Mrs.Rickels) calling, states that patient has not had a bowel movement since his procedure on Friday

## 2018-01-14 NOTE — Telephone Encounter (Signed)
Returned call to wife-states patient has not had bowel movement since heart cath on Friday.   Reports patient feels bloated, not in any pain, + gas.    Advised to try OTC Miralax and stay hydrated.    Wife aware and verbalized understanding.

## 2018-01-15 ENCOUNTER — Telehealth: Payer: Self-pay | Admitting: Cardiology

## 2018-01-15 NOTE — Telephone Encounter (Signed)
New message    Pt c/o Shortness Of Breath: STAT if SOB developed within the last 24 hours or pt is noticeably SOB on the phone  1. Are you currently SOB (can you hear that pt is SOB on the phone)? N/a- wife on phone  2. How long have you been experiencing SOB? 1 day  3. Are you SOB when sitting or when up moving around? sitting  4. Are you currently experiencing any other symptoms? lightheaded

## 2018-01-15 NOTE — Telephone Encounter (Signed)
Returned call to patient's wife who is s/p coronary stent on 1/19. Patient c/o episodic shortness of breath, preceded by lightheadedness and symptoms resolve when he takes a deep breath. He denies chest pain. He is sitting during these episodes. VSS per wife (noted below). He has a stress test scheduled for 1/30 to evaluate for anterior wall ischemia.   HR was in the 60s yesterday = 67 (AM), 60 (midday), 66 (before bed) BP this AM 113/76, HR 63 BP around noon 122/65, HR 46   Spoke with DOD Dr. Sallyanne Kuster - symptoms sound like they are potentially related to Brilinta. Advised that patient try some caffeine with his medication to help alleviate this side effect. Asked that they call back if this recommendation does not help with symptoms.

## 2018-01-16 ENCOUNTER — Telehealth (HOSPITAL_COMMUNITY): Payer: Self-pay

## 2018-01-16 NOTE — Telephone Encounter (Signed)
Patients insurance is active and benefits verified through Medicare Part A & B - No co-pay, deductible amount of $185.00/$0.00 has been met, no out of pocket, 20% co-insurance, and no pre-authorization is required. Passport/reference [id number]  Patients insurance is active and benefits verified through [place] - No co-pay, no deductible, out of pocket amount of $7,350/$3,527 has been met, 30% co-insurance, and no pre-authorization is required. Passport/reference [phone]  Patient will be contacted and scheduled after their follow up appt with the Cardiologist office upon review by the RN Navigator.

## 2018-01-16 NOTE — Telephone Encounter (Signed)
Encounter complete. 

## 2018-01-19 NOTE — Progress Notes (Signed)
HPI The patient presents for follow up of CAD.   In 2012 he had a stress test indicating anterior ischemia mild but from base to apex.  I sent him for a cath demonstrating LAD a 60-70% stenosis, moderate size second diagonal with 50% stenosis, circumflex AV groove 75-80% stenosis with a branching obtuse marginal with a superior branch subtotal stenosis followed by diffuse disease, right coronary artery had nonobstructive disease.  EF was 35%.   He was managed medically.  He has had an ICD.  However, when he had ERI Dr. Lovena Le elected not to replace the device. He had a recall lead and it was removed.  He was in the hospital earlier this month with inferior MI.    He had disease as below and underwent urgent DES to to the mid RCA and occluded left circumflex.    EF on echo was 40 - 45%.  He had some residual disease in the LAD and the plan was for out patient stress testing with PCI as needed.  He returns for [facility] follow up.  Of note he had an infrarenal abdominal aortic aneurysm extending into the iliac arteries noted at the time of catheterization.  Today he had a stress perfusion study and the preliminary results suggest no reduced perfusion in the area of the LAD although final images are pending.  He has now stopped smoking.  He is not describing any new chest pressure, neck or arm discomfort.  He is not describing any new palpitations, presyncope or syncope.  He has had no weight gain or edema.  He has had none of the chest pain that he had in the weeks leading up to his catheterization.  He has not required any nitroglycerin.  No Known Allergies  Current Outpatient Medications  Medication Sig Dispense Refill  . aspirin 81 MG tablet Take 81 mg by mouth daily.     Marland Kitchen atorvastatin (LIPITOR) 80 MG tablet Take 1 tablet (80 mg total) by mouth daily at 6 PM. 90 tablet 3  . carvedilol (COREG) 3.125 MG tablet Take 1 tablet (3.125 mg total) by mouth 2 (two) times daily with a meal. 180 tablet 3  . losartan  (COZAAR) 25 MG tablet Take 1 tablet (25 mg total) by mouth daily. 90 tablet 3  . multivitamin (THERAGRAN) per tablet Take 1 tablet by mouth daily.     . nitroGLYCERIN (NITROSTAT) 0.4 MG SL tablet Place 1 tablet (0.4 mg total) under the tongue every 5 (five) minutes as needed. 25 tablet 2  . spironolactone (ALDACTONE) 25 MG tablet Take 0.5 tablets (12.5 mg total) by mouth daily. 45 tablet 3  . ticagrelor (BRILINTA) 90 MG TABS tablet Take 1 tablet (90 mg total) by mouth 2 (two) times daily. 180 tablet 3  . vitamin B-12 (CYANOCOBALAMIN) 1000 MCG tablet Take 1,000 mcg by mouth daily.      No current facility-administered medications for this visit.     Past Medical History:  Diagnosis Date  . Arthritis   . Automatic implantable cardioverter-defibrillator in situ   . Benign essential HTN 09/17/2016  . COPD (chronic obstructive pulmonary disease) ([place])    emphysema by CXR  . Coronary artery disease     LAD a 60-70% stenosis, moderate size second diagonal with 50% stenosis, circumflex AV groove 75-80% stenosis with a branching obtuse marginal with a superior branch subtotal stenosis followed by diffuse disease, right coronary artery  had nonobstructive disease.  EF was 35%  . Diabetes mellitus  type 2  . Diverticulosis of colon   . GERD (gastroesophageal reflux disease)    with esophagitis  . History of colonic polyps   . Hypertension   . Non-ischemic cardiomyopathy ([place])   . Presence of permanent cardiac pacemaker   . PVD (peripheral vascular disease) ([place])    bilateral common iliac artery aneurysms. Right SFA occlusion over a long segment. Left SFA disease with occlusion of the left TP trunk. Artirogram Oct. 2006  . Shortness of breath    exertion  . Sleep apnea   . Tobacco abuse   . Urinary incontinence    detrussor instability    Past Surgical History:  Procedure Laterality Date  . CARDIAC CATHETERIZATION  06/2004   negative  . COLONOSCOPY  04/2005  . COPD exacerbation    .  CORONARY ANGIOPLASTY  01/09/2018  . CORONARY STENT INTERVENTION N/A 01/10/2018   Procedure: CORONARY STENT INTERVENTION;  Surgeon: Lorretta Harp, MD;  Location: [facility];  Service: Cardiovascular;  Laterality: N/A;  . CORONARY/GRAFT ACUTE MI REVASCULARIZATION N/A 01/10/2018   Procedure: Coronary/Graft Acute MI Revascularization;  Surgeon: Lorretta Harp, MD;  Location: [facility];  Service: Cardiovascular;  Laterality: N/A;  . LEFT HEART CATH AND CORONARY ANGIOGRAPHY N/A 01/10/2018   Procedure: LEFT HEART CATH AND CORONARY ANGIOGRAPHY;  Surgeon: Lorretta Harp, MD;  Location: [facility];  Service: Cardiovascular;  Laterality: N/A;  . PACEMAKER INSERTION  11/2005  . PACEMAKER LEAD REMOVAL N/A 10/17/2014   Procedure: PACEMAKER LEAD REMOVAL;  Surgeon: Evans Lance, MD;  Location: [facility];  Service: Cardiovascular;  Laterality: N/A;  . s/p ICD placement      Medtronic Maximo [phone] single chamber    ROS:   As stated in the HPI and negative for all other systems.   PHYSICAL EXAM BP 137/82   Pulse 66   Ht 5\' 8"  (1.727 m)   Wt 261 lb 12.8 oz (118.8 kg)   BMI 39.81 kg/m   GENERAL:  Well appearing.  NECK:  No jugular venous distention, waveform within normal limits, carotid upstroke brisk and symmetric, no bruits, no thyromegaly LUNGS:  Clear to auscultation bilaterally CHEST:  ICD scar well healed. HEART:  PMI not displaced or sustained,S1 and S2 within normal limits, no S3, no S4, no clicks, no rubs, no murmurs ABD:  Flat, positive bowel sounds normal in frequency in pitch, no bruits, no rebound, no guarding, no midline pulsatile mass, no hepatomegaly, no splenomegaly, obese EXT:  2 plus pulses upper and absent DP/PT bilateral, no edema, no cyanosis no clubbing    EKG: NA  Lab Results  Component Value Date   CHOL 278 (H) 01/09/2018   TRIG 685 (H) 01/09/2018   HDL 30 (L) 01/09/2018   LDLCALC UNABLE TO CALCULATE IF TRIGLYCERIDE OVER 400 mg/dL 01/09/2018     LDLDIRECT 73.0 12/11/2016   CATH  01/10/18  Coronary Findings   Diagnostic  Dominance: Right  Left Anterior Descending  Ost LAD to Prox LAD lesion 50% stenosed  Ost LAD to Prox LAD lesion is 50% stenosed.  Left Circumflex  Prox Cx to Mid Cx lesion 100% stenosed  Prox Cx to Mid Cx lesion is 100% stenosed.  Right Coronary Artery  Mid RCA lesion 100% stenosed  Mid RCA lesion is 100% stenosed.  Intervention   Prox Cx to Mid Cx lesion  Stent  A stent was successfully placed.  Post-Intervention Lesion Assessment  There is no residual stenosis post intervention.  Mid RCA lesion  Stent  A stent was successfully placed.  Post-Intervention Lesion Assessment  There is no residual stenosis post intervention.     ASSESSMENT AND PLAN  CAD:   The patient does not appear to have ischemia in the anterior distribution so he will be managed medically.  He needs aggressive risk reduction.  He will continue the meds as listed.   HTN: This will be managed in the context of treating his slightly reduced ejection fraction.  He has been intolerant of med titrations in the past because of dizziness but will try to do this going forward.  For today he will stay on the meds as listed.  CAROTID STENOSIS:  He had 60 - 79% on the left.  He is due for carotid follow-up in May and we will arrange this.  TOBACCO ABUSE:  He quit smoking.  CARDIOMYOPATHY/CHRONIC SYSTOLIC HF:   EF was 40 - 45% on echo.  As above we will attempt med titration going forward.  He was taken off ACE inhibitors before because of dizziness.  He is tolerating low-dose Cozaar for now.    DYSLIPIDEMIA:   His lipids were very elevated in the hospital when he was not  taking a statin.  This has been started.  I will check a lipid in March.    ABDOMINAL ANEURYSM:  We will get an CT aortogram.

## 2018-01-21 ENCOUNTER — Ambulatory Visit (HOSPITAL_COMMUNITY)
Admission: RE | Admit: 2018-01-21 | Discharge: 2018-01-21 | Disposition: A | Discharge status: Home / Self Care | Payer: BLUE CROSS/BLUE SHIELD | Source: Ambulatory Visit | Attending: Cardiology | Admitting: Cardiology

## 2018-01-21 ENCOUNTER — Ambulatory Visit (INDEPENDENT_AMBULATORY_CARE_PROVIDER_SITE_OTHER): Payer: BLUE CROSS/BLUE SHIELD | Admitting: Cardiology

## 2018-01-21 ENCOUNTER — Encounter: Payer: Self-pay | Admitting: Cardiology

## 2018-01-21 VITALS — BP 137/82 | HR 66 | Ht 68.0 in | Wt 261.8 lb

## 2018-01-21 DIAGNOSIS — I714 Abdominal aortic aneurysm, without rupture, unspecified: Secondary | ICD-10-CM

## 2018-01-21 DIAGNOSIS — I25110 Atherosclerotic heart disease of native coronary artery with unstable angina pectoris: Secondary | ICD-10-CM | POA: Insufficient documentation

## 2018-01-21 DIAGNOSIS — Z79899 Other long term (current) drug therapy: Secondary | ICD-10-CM

## 2018-01-21 DIAGNOSIS — I2119 ST elevation (STEMI) myocardial infarction involving other coronary artery of inferior wall: Secondary | ICD-10-CM | POA: Diagnosis not present

## 2018-01-21 DIAGNOSIS — E785 Hyperlipidemia, unspecified: Secondary | ICD-10-CM

## 2018-01-21 DIAGNOSIS — I10 Essential (primary) hypertension: Secondary | ICD-10-CM | POA: Diagnosis not present

## 2018-01-21 DIAGNOSIS — I2111 ST elevation (STEMI) myocardial infarction involving right coronary artery: Secondary | ICD-10-CM

## 2018-01-21 LAB — MYOCARDIAL PERFUSION IMAGING
CHL CUP NUCLEAR SDS: 4
CHL CUP NUCLEAR SSS: 16
LV dias vol: 239 mL (ref 62–150)
LV sys vol: 157 mL
NUC STRESS TID: 1.29
Peak HR: 89 {beats}/min
Rest HR: 64 {beats}/min
SRS: 12

## 2018-01-21 MED ORDER — TICAGRELOR 90 MG PO TABS: 90.0000 mg | ORAL_TABLET | Freq: Two times a day (BID) | ORAL | 3 refills | Status: DC

## 2018-01-21 MED ORDER — ATORVASTATIN CALCIUM 80 MG PO TABS: 80.0000 mg | ORAL_TABLET | Freq: Every day | ORAL | 3 refills | Status: DC

## 2018-01-21 MED ORDER — SPIRONOLACTONE 25 MG PO TABS: 12.5000 mg | ORAL_TABLET | Freq: Every day | ORAL | 3 refills | Status: DC

## 2018-01-21 MED ORDER — REGADENOSON 0.4 MG/5ML IV SOLN
0.4000 mg | Freq: Once | INTRAVENOUS | Status: AC
  Administered 2018-01-21: 0.4 mg via INTRAVENOUS

## 2018-01-21 MED ORDER — LOSARTAN POTASSIUM 25 MG PO TABS: 25.0000 mg | ORAL_TABLET | Freq: Every day | ORAL | 3 refills | Status: DC

## 2018-01-21 MED ORDER — TECHNETIUM TC 99M TETROFOSMIN IV KIT
10.6000 | PACK | Freq: Once | INTRAVENOUS | Status: AC | PRN
  Administered 2018-01-21: 10.6 via INTRAVENOUS
  Filled 2018-01-21: qty 11

## 2018-01-21 MED ORDER — TECHNETIUM TC 99M TETROFOSMIN IV KIT
29.1000 | PACK | Freq: Once | INTRAVENOUS | Status: AC | PRN
  Administered 2018-01-21: 29.1 via INTRAVENOUS
  Filled 2018-01-21: qty 30

## 2018-01-21 MED ORDER — CARVEDILOL 3.125 MG PO TABS: 3.1250 mg | ORAL_TABLET | Freq: Two times a day (BID) | ORAL | 3 refills | Status: DC

## 2018-01-21 NOTE — Patient Instructions (Addendum)
Medication Instructions:  Continue current medications  If you need a refill on your cardiac medications before your next appointment, please call your pharmacy.  Labwork: BMP HERE IN OUR OFFICE AT [facility]  Take the provided lab slips for you to take with you to the lab for you blood draw.   You will NOT need to fast   You may go to any [facility] lab that is convenient for you however, we do have a lab in our office that is able to assist you. You do NOT need an appointment for our lab. Once in our office lobby there is a podium to the right of the check-in desk where you are to sign-in and ring a doorbell to alert [place] you are here. Lab is open Monday-Friday from 8:00am to 4:00pm; and is closed for lunch from 12:45p-1:45pm   Testing/Procedures: Non-Cardiac CT scanning, (CAT scanning), is a noninvasive, special x-ray that produces cross-sectional images of the body using x-rays and a computer. CT scans help physicians diagnose and treat medical conditions. For some CT exams, a contrast material is used to enhance visibility in the area of the body being studied. CT scans provide greater clarity and reveal more details than regular x-ray exams.    Follow-Up: Your physician wants you to follow-up in:  After CT.   Thank you for choosing [facility]!!

## 2018-01-26 ENCOUNTER — Telehealth (HOSPITAL_COMMUNITY): Payer: Self-pay

## 2018-01-26 NOTE — Telephone Encounter (Signed)
Attempted to call patient in regards to Cardiac Rehab - Lm on vm °

## 2018-01-28 ENCOUNTER — Other Ambulatory Visit (INDEPENDENT_AMBULATORY_CARE_PROVIDER_SITE_OTHER): Payer: BLUE CROSS/BLUE SHIELD

## 2018-01-28 DIAGNOSIS — I10 Essential (primary) hypertension: Secondary | ICD-10-CM

## 2018-01-28 DIAGNOSIS — I714 Abdominal aortic aneurysm, without rupture, unspecified: Secondary | ICD-10-CM

## 2018-01-28 LAB — COMPREHENSIVE METABOLIC PANEL
ALT: 24 U/L (ref 0–53)
AST: 23 U/L (ref 0–37)
Albumin: 4.3 g/dL (ref 3.5–5.2)
Alkaline Phosphatase: 93 U/L (ref 39–117)
BUN: 15 mg/dL (ref 6–23)
CO2: 28 meq/L (ref 19–32)
Calcium: 9.4 mg/dL (ref 8.4–10.5)
Chloride: 100 mEq/L (ref 96–112)
Creatinine, Ser: 0.77 mg/dL (ref 0.40–1.50)
GFR: 109.09 mL/min (ref >60.00)
GLUCOSE: 95 mg/dL (ref 70–99)
POTASSIUM: 4.3 meq/L (ref 3.5–5.1)
Sodium: 135 mEq/L (ref 135–145)
Total Bilirubin: 0.8 mg/dL (ref 0.2–1.2)
Total Protein: 7.6 g/dL (ref 6.0–8.3)

## 2018-01-28 LAB — LIPID PANEL
CHOL/HDL RATIO: 4
CHOLESTEROL: 117 mg/dL (ref 0–200)
HDL: 29.1 mg/dL — AB (ref >39.00)
NonHDL: 88.21
TRIGLYCERIDES: 223 mg/dL — AB (ref 0.0–149.0)
VLDL: 44.6 mg/dL — ABNORMAL HIGH (ref 0.0–40.0)

## 2018-01-28 LAB — CBC WITH DIFFERENTIAL/PLATELET
BASOS ABS: 0.1 10*3/uL (ref 0.0–0.1)
Basophils Relative: 0.7 % (ref 0.0–3.0)
EOS PCT: 1.4 % (ref 0.0–5.0)
Eosinophils Absolute: 0.1 10*3/uL (ref 0.0–0.7)
HCT: 43.7 % (ref 39.0–52.0)
Hemoglobin: 15.1 g/dL (ref 13.0–17.0)
Lymphocytes Relative: 34.2 % (ref 12.0–46.0)
Lymphs Abs: 3.5 10*3/uL (ref 0.7–4.0)
MCHC: 34.5 g/dL (ref 30.0–36.0)
MCV: 82.7 fl (ref 78.0–100.0)
MONO ABS: 0.6 10*3/uL (ref 0.1–1.0)
MONOS PCT: 6.1 % (ref 3.0–12.0)
NEUTROS ABS: 6 10*3/uL (ref 1.4–7.7)
NEUTROS PCT: 57.6 % (ref 43.0–77.0)
PLATELETS: 297 10*3/uL (ref 150.0–400.0)
RBC: 5.29 Mil/uL (ref 4.22–5.81)
RDW: 13.2 % (ref 11.5–15.5)
WBC: 10.4 10*3/uL (ref 4.0–10.5)

## 2018-01-28 LAB — LDL CHOLESTEROL, DIRECT: Direct LDL: 35 mg/dL

## 2018-01-28 NOTE — Addendum Note (Signed)
Addended by: Ellamae Sia on: 01/28/2018 04:23 PM   Modules accepted: Orders

## 2018-02-02 ENCOUNTER — Encounter (HOSPITAL_COMMUNITY): Payer: Self-pay

## 2018-02-02 ENCOUNTER — Telehealth (HOSPITAL_COMMUNITY): Payer: Self-pay

## 2018-02-02 NOTE — Telephone Encounter (Signed)
2nd attempt to call patient in regards to Cardiac Rehab - lm on vm. Sending letter. °

## 2018-02-03 ENCOUNTER — Ambulatory Visit (INDEPENDENT_AMBULATORY_CARE_PROVIDER_SITE_OTHER)
Admission: RE | Admit: 2018-02-03 | Discharge: 2018-02-03 | Disposition: A | Discharge status: Home / Self Care | Payer: BLUE CROSS/BLUE SHIELD | Source: Ambulatory Visit | Attending: Cardiology | Admitting: Cardiology

## 2018-02-03 DIAGNOSIS — I714 Abdominal aortic aneurysm, without rupture, unspecified: Secondary | ICD-10-CM

## 2018-02-03 MED ORDER — IOPAMIDOL (ISOVUE-370) INJECTION 76%
100.0000 mL | Freq: Once | INTRAVENOUS | Status: AC | PRN
  Administered 2018-02-03: 100 mL via INTRAVENOUS

## 2018-02-09 ENCOUNTER — Telehealth: Payer: Self-pay | Admitting: *Deleted

## 2018-02-09 DIAGNOSIS — I723 Aneurysm of iliac artery: Secondary | ICD-10-CM

## 2018-02-09 NOTE — Telephone Encounter (Signed)
Spoke with pt about his CT angio, advised pt that he will be refer to vascular surgery because of change in size of his aneurysm, pt voice understanding. Referral send to [facility].

## 2018-02-09 NOTE — Telephone Encounter (Signed)
-----   Message from Minus Breeding, MD sent at 02/08/2018  1:01 PM EST ----- Right common iliac artery aneurysm measures 6.7 cm compared with 3.4 cm on the prior study. Left common iliac artery aneurysm measures 5.8 cm compared with 2.8 cm on the prior study.  He needs to referred to the next available [facility] new patient appt.    Call Mr. Maultsby with the results and send results to Venia Carbon, MD

## 2018-02-18 ENCOUNTER — Telehealth (HOSPITAL_COMMUNITY): Payer: Self-pay

## 2018-02-18 NOTE — Telephone Encounter (Signed)
3rd attempt to call patient in regards to cardiac rehab - lm on vm

## 2018-02-27 ENCOUNTER — Encounter: Payer: BLUE CROSS/BLUE SHIELD | Admitting: Vascular Surgery

## 2018-03-05 NOTE — H&P (View-Only) (Signed)
Requested by: Venia Carbon, MD [place], [place] [id number]  Reason for consultation: AAA   History of Present Illness   [name] is a 62 y.o. ([birth year]) male who presents with chief complaint: known aneurysm.  Patient previously had a AAA diagnosed during a cardiac catheterization.  His most recent CTA lead to referral for management of his known AAA.  Previous studies demonstrate an AAA, measuring 4.2 cm, R CIA aneurysm 6.7 cm, and L CIA aneurysm 5.8 cm.  The patient does have chronic back pain without abdominal pain.  The patient notes not history of embolic episodes from the AAA.  The patient's risk factors for AAA included: male sex, age and prior smoking.  The patient does not smoke cigarettes.  Patient has known short distal intermittent claudication.  He recently had PCI in Jan 2019.   Past Medical History:  Diagnosis Date  . Arthritis   . Automatic implantable cardioverter-defibrillator in situ   . Benign essential HTN 09/17/2016  . COPD (chronic obstructive pulmonary disease) ([place])    emphysema by CXR  . Coronary artery disease     LAD a 60-70% stenosis, moderate size second diagonal with 50% stenosis, circumflex AV groove 75-80% stenosis with a branching obtuse marginal with a superior branch subtotal stenosis followed by diffuse disease, right coronary artery  had nonobstructive disease.  EF was 35%  . Diabetes mellitus    type 2  . Diverticulosis of colon   . GERD (gastroesophageal reflux disease)    with esophagitis  . History of colonic polyps   . Hypertension   . Non-ischemic cardiomyopathy ([place])   . Presence of permanent cardiac pacemaker   . PVD (peripheral vascular disease) ([place])    bilateral common iliac artery aneurysms. Right SFA occlusion over a long segment. Left SFA disease with occlusion of the left TP trunk. Artirogram Oct. 2006  . Shortness of breath    exertion  . Sleep apnea   . Tobacco abuse   . Urinary  incontinence    detrussor instability    Past Surgical History:  Procedure Laterality Date  . CARDIAC CATHETERIZATION  06/2004   negative  . COLONOSCOPY  04/2005  . COPD exacerbation    . CORONARY ANGIOPLASTY  01/09/2018  . CORONARY STENT INTERVENTION N/A 01/10/2018   Procedure: CORONARY STENT INTERVENTION;  Surgeon: Lorretta Harp, MD;  Location: [facility];  Service: Cardiovascular;  Laterality: N/A;  . CORONARY/GRAFT ACUTE MI REVASCULARIZATION N/A 01/10/2018   Procedure: Coronary/Graft Acute MI Revascularization;  Surgeon: Lorretta Harp, MD;  Location: [facility];  Service: Cardiovascular;  Laterality: N/A;  . LEFT HEART CATH AND CORONARY ANGIOGRAPHY N/A 01/10/2018   Procedure: LEFT HEART CATH AND CORONARY ANGIOGRAPHY;  Surgeon: Lorretta Harp, MD;  Location: [facility];  Service: Cardiovascular;  Laterality: N/A;  . PACEMAKER INSERTION  11/2005  . PACEMAKER LEAD REMOVAL N/A 10/17/2014   Procedure: PACEMAKER LEAD REMOVAL;  Surgeon: Evans Lance, MD;  Location: [facility] OR;  Service: Cardiovascular;  Laterality: N/A;  . s/p ICD placement      Medtronic Maximo [phone] single chamber    Social History   Socioeconomic History  . Marital status: Married    Spouse name: Not on file  . Number of children: 2  . Years of education: Not on file  . Highest education level: Not on file  Social Needs  . Financial resource strain: Not on file  .  Food insecurity - worry: Not on file  . Food insecurity - inability: Not on file  . Transportation needs - medical: Not on file  . Transportation needs - non-medical: Not on file  Occupational History  . Occupation: Grave digger--now disabled  Tobacco Use  . Smoking status: Current Every Day Smoker    Packs/day: 2.00    Years: 15.00    Pack years: 30.00    Types: Cigarettes  . Smokeless tobacco: Never Used  . Tobacco comment: GAVE 1-800-QUIT-NOW  Substance and Sexual Activity  . Alcohol use: No  . Drug use: No    . Sexual activity: No  Other Topics Concern  . Not on file  Social History Narrative   No living will   Requests wife as health care POA   Would accept resuscitation but doesn't want prolonged ventilation   No tube feeds if cognitively unaware    Family History  Problem Relation Age of Onset  . Stroke Father   . Coronary artery disease Maternal Aunt   . Heart failure Maternal Aunt   . Lung cancer Maternal Aunt   . Lung cancer Maternal Uncle   . Cancer Brother        Mouth  . Cancer Sister        throat    Current Outpatient Medications  Medication Sig Dispense Refill  . aspirin 81 MG tablet Take 81 mg by mouth daily.     Marland Kitchen atorvastatin (LIPITOR) 80 MG tablet Take 1 tablet (80 mg total) by mouth daily at 6 PM. 90 tablet 3  . carvedilol (COREG) 3.125 MG tablet Take 1 tablet (3.125 mg total) by mouth 2 (two) times daily with a meal. 180 tablet 3  . losartan (COZAAR) 25 MG tablet Take 1 tablet (25 mg total) by mouth daily. 90 tablet 3  . multivitamin (THERAGRAN) per tablet Take 1 tablet by mouth daily.     . nitroGLYCERIN (NITROSTAT) 0.4 MG SL tablet Place 1 tablet (0.4 mg total) under the tongue every 5 (five) minutes as needed. 25 tablet 2  . spironolactone (ALDACTONE) 25 MG tablet Take 0.5 tablets (12.5 mg total) by mouth daily. 45 tablet 3  . ticagrelor (BRILINTA) 90 MG TABS tablet Take 1 tablet (90 mg total) by mouth 2 (two) times daily. 180 tablet 3  . vitamin B-12 (CYANOCOBALAMIN) 1000 MCG tablet Take 1,000 mcg by mouth daily.      No current facility-administered medications for this visit.      No Known Allergies  REVIEW OF SYSTEMS (negative unless checked):   Cardiac:  []  Chest pain or chest pressure? [x]  Shortness of breath upon activity? []  Shortness of breath when lying flat? []  Irregular heart rhythm?  Vascular:  []  Pain in calf, thigh, or hip brought on by walking? []  Pain in feet at night that wakes you up from your sleep? []  Blood clot in your  veins? []  Leg swelling?  Pulmonary:  []  Oxygen at home? []  Productive cough? []  Wheezing?  Neurologic:  []  Sudden weakness in arms or legs? [x]  Sudden numbness in arms or legs? []  Sudden onset of difficult speaking or slurred speech? []  Temporary loss of vision in one eye? []  Problems with dizziness?  Gastrointestinal:  []  Blood in stool? []  Vomited blood?  Genitourinary:  []  Burning when urinating? []  Blood in urine?  Psychiatric:  []  Major depression  Hematologic:  []  Bleeding problems? []  Problems with blood clotting?  Dermatologic:  []  Rashes or ulcers?  Constitutional:  []   Fever or chills?  Ear/Nose/Throat:  []  Change in hearing? []  Nose bleeds? []  Sore throat?  Musculoskeletal:  []  Back pain? []  Joint pain? []  Muscle pain?   For VQI Use Only   PRE-ADM LIVING Home  AMB STATUS Ambulatory  CAD Sx History of MI, but no symptoms MI < 6 months ago  PRIOR CHF None  STRESS TEST Ischemia   Physical Examination     Vitals:   03/06/18 1032  BP: 130/70  Pulse: (!) 48  Resp: 18  Temp: (!) 97.4 F (36.3 C)  TempSrc: Oral  SpO2: 96%  Weight: 269 lb (122 kg)  Height: 5\' 9"  (1.753 m)   Body mass index is 39.72 kg/m.  General Alert, O x 3, Obese, NAD  Head [place]/AT,    Ear/Nose/ Throat Hearing grossly intact, nares without erythema or drainage, oropharynx without Erythema or Exudate, Mallampati score: 3,   Eyes PERRLA, EOMI,    Neck Supple, mid-line trachea,    Pulmonary Sym exp, good B air movt, CTA B  Cardiac RRR, Nl S1, S2, no Murmurs, No rubs, No S3,S4  Vascular Vessel Right Left  Radial Palpable Palpable  Brachial Palpable Palpable  Carotid Palpable, No Bruit Palpable, No Bruit  Aorta Not palpable N/A  Femoral Palpable Palpable  Popliteal Not palpable Not palpable  PT Not palpable Not palpable  DP Not palpable Not palpable    Gastro- intestinal soft, non-distended, non-tender to palpation, No guarding or rebound, no HSM, no masses, no  CVAT B, No palpable prominent aortic pulse, large pannus  Musculo- skeletal M/S 5/5 throughout  , Extremities without ischemic changes  , Non-pitting edema present: B 1-2+, No visible varicosities , Lipodermatosclerosis present: mild B  Neurologic Cranial nerves 2-12 intact , Pain and light touch intact in extremities , Motor exam as listed above  Psychiatric Judgement intact, Mood & affect appropriate for pt's clinical situation  Dermatologic See M/S exam for extremity exam, No rashes otherwise noted  Lymphatic  Palpable lymph nodes: None    Radiology     CTA abd/pelvis (02/03/18): VASCULAR  Abdominal aortic aneurysm measures 4.2 cm in diameter. Recommend followup by ultrasound in 1 year. This recommendation follows ACR consensus guidelines: White Paper of the ACR Incidental Findings Committee II on Vascular Findings. J Am Coll Radiol 2013; 10:789-794.  Right common iliac artery aneurysm measures 6.7 cm compared with 3.4 cm on the prior study.  Left common iliac artery aneurysm measures 5.8 cm compared with 2.8 cm on the prior study.  Significant narrowing at the origin of the left external iliac artery.  Bilateral superficial femoral artery occlusion.  NON-VASCULAR  No acute intra-abdominal pathology.  Lumbar spinal stenosis.  Based on my review of this patient's CTA, he has moderate sized AAA and bilateral large CIA.  Both internal iliac artery appeared heavily diseased, making neither suitable for IBE   Medical Decision Making   RENJI BERWICK is a 62 y.o. ([birth year]) male  who presents with: asx Moderate sized AAA with bilateral large common iliac artery aneurysm, B PAD, B IIA atherosclerosis   I reviewed the CTA with my [employer] rep.  We both agree that IBE is likely not possible given the significant B IIA stenoses.    I suspect both may be in the process of occluding, but can't be certain with aortogram.  Patient needs staged bilateral internal iliac  artery embolization to avoid Type II endoleaks, will try to start with L IIA and stage the R IIA embolization one  month later to get pelvic collaterization.  After the second IIA embolization, will repair the AAA and both CIA aneuysms with EVAR and extension into B external iliac artery 2 weeks after the B IIA are embolization..  Given pt's history of short distal intermittent claudication, will get some additional studies: BLE ABI, B popliteal artery duplex, and Aortoiliac duplex.  I offered the patient first: aortogram, bilateral pelvic angiogram, possible L IIA embolization on 28 MAR 19. I discussed with the patient the nature of angiographic procedures, especially the limited patencies of any endovascular intervention.   The patient is aware of that the risks of an angiographic procedure include but are not limited to: bleeding, infection, access site complications, renal failure, embolization, rupture of vessel, dissection, arteriovenous fistula, possible need for emergent surgical intervention, possible need for surgical procedures to treat the patient's pathology, anaphylactic reaction to contrast, and stroke and death.   We discussed extensively that embolizing the L IIA can result in but not limited to: pelvic ischemia resulting in buttock claudication, colonic ischemia, erectile dynsfunction and spinal ischemia. The patient is aware of the risks and agrees to proceed.  I emphasized the importance of maximal medical management including strict control of blood pressure, blood glucose, and lipid levels, antiplatelet agents, obtaining regular exercise, and cessation of smoking.   Thank you for allowing [place] to participate in this patient's care.   Adele Barthel, MD, FACS [facility] Office: [phone] Pager: [phone]  03/05/2018, 11:45 AM

## 2018-03-05 NOTE — Progress Notes (Signed)
Requested by: Venia Carbon, MD [place], [place] [id number]  Reason for consultation: AAA   History of Present Illness   [name] is a 62 y.o. (October 29, 1956) male who presents with chief complaint: known aneurysm.  Patient previously had a AAA diagnosed during a cardiac catheterization.  His most recent CTA lead to referral for management of his known AAA.  Previous studies demonstrate an AAA, measuring 4.2 cm, R CIA aneurysm 6.7 cm, and L CIA aneurysm 5.8 cm.  The patient does have chronic back pain without abdominal pain.  The patient notes not history of embolic episodes from the AAA.  The patient's risk factors for AAA included: male sex, age and prior smoking.  The patient does not smoke cigarettes.  Patient has known short distal intermittent claudication.  He recently had PCI in Jan 2019.   Past Medical History:  Diagnosis Date  . Arthritis   . Automatic implantable cardioverter-defibrillator in situ   . Benign essential HTN 09/17/2016  . COPD (chronic obstructive pulmonary disease) ([place])    emphysema by CXR  . Coronary artery disease     LAD a 60-70% stenosis, moderate size second diagonal with 50% stenosis, circumflex AV groove 75-80% stenosis with a branching obtuse marginal with a superior branch subtotal stenosis followed by diffuse disease, right coronary artery  had nonobstructive disease.  EF was 35%  . Diabetes mellitus    type 2  . Diverticulosis of colon   . GERD (gastroesophageal reflux disease)    with esophagitis  . History of colonic polyps   . Hypertension   . Non-ischemic cardiomyopathy ([place])   . Presence of permanent cardiac pacemaker   . PVD (peripheral vascular disease) ([place])    bilateral common iliac artery aneurysms. Right SFA occlusion over a long segment. Left SFA disease with occlusion of the left TP trunk. Artirogram Oct. 2006  . Shortness of breath    exertion  . Sleep apnea   . Tobacco abuse   . Urinary  incontinence    detrussor instability    Past Surgical History:  Procedure Laterality Date  . CARDIAC CATHETERIZATION  06/2004   negative  . COLONOSCOPY  04/2005  . COPD exacerbation    . CORONARY ANGIOPLASTY  01/09/2018  . CORONARY STENT INTERVENTION N/A 01/10/2018   Procedure: CORONARY STENT INTERVENTION;  Surgeon: Lorretta Harp, MD;  Location: [facility];  Service: Cardiovascular;  Laterality: N/A;  . CORONARY/GRAFT ACUTE MI REVASCULARIZATION N/A 01/10/2018   Procedure: Coronary/Graft Acute MI Revascularization;  Surgeon: Lorretta Harp, MD;  Location: [facility];  Service: Cardiovascular;  Laterality: N/A;  . LEFT HEART CATH AND CORONARY ANGIOGRAPHY N/A 01/10/2018   Procedure: LEFT HEART CATH AND CORONARY ANGIOGRAPHY;  Surgeon: Lorretta Harp, MD;  Location: [facility];  Service: Cardiovascular;  Laterality: N/A;  . PACEMAKER INSERTION  11/2005  . PACEMAKER LEAD REMOVAL N/A 10/17/2014   Procedure: PACEMAKER LEAD REMOVAL;  Surgeon: Evans Lance, MD;  Location: [facility];  Service: Cardiovascular;  Laterality: N/A;  . s/p ICD placement      Medtronic Maximo [phone] single chamber    Social History   Socioeconomic History  . Marital status: Married    Spouse name: Not on file  . Number of children: 2  . Years of education: Not on file  . Highest education level: Not on file  Social Needs  . Financial resource strain: Not on file  .  Food insecurity - worry: Not on file  . Food insecurity - inability: Not on file  . Transportation needs - medical: Not on file  . Transportation needs - non-medical: Not on file  Occupational History  . Occupation: Grave digger--now disabled  Tobacco Use  . Smoking status: Current Every Day Smoker    Packs/day: 2.00    Years: 15.00    Pack years: 30.00    Types: Cigarettes  . Smokeless tobacco: Never Used  . Tobacco comment: GAVE 1-800-QUIT-NOW  Substance and Sexual Activity  . Alcohol use: No  . Drug use: No    . Sexual activity: No  Other Topics Concern  . Not on file  Social History Narrative   No living will   Requests wife as health care POA   Would accept resuscitation but doesn't want prolonged ventilation   No tube feeds if cognitively unaware    Family History  Problem Relation Age of Onset  . Stroke Father   . Coronary artery disease Maternal Aunt   . Heart failure Maternal Aunt   . Lung cancer Maternal Aunt   . Lung cancer Maternal Uncle   . Cancer Brother        Mouth  . Cancer Sister        throat    Current Outpatient Medications  Medication Sig Dispense Refill  . aspirin 81 MG tablet Take 81 mg by mouth daily.     Marland Kitchen atorvastatin (LIPITOR) 80 MG tablet Take 1 tablet (80 mg total) by mouth daily at 6 PM. 90 tablet 3  . carvedilol (COREG) 3.125 MG tablet Take 1 tablet (3.125 mg total) by mouth 2 (two) times daily with a meal. 180 tablet 3  . losartan (COZAAR) 25 MG tablet Take 1 tablet (25 mg total) by mouth daily. 90 tablet 3  . multivitamin (THERAGRAN) per tablet Take 1 tablet by mouth daily.     . nitroGLYCERIN (NITROSTAT) 0.4 MG SL tablet Place 1 tablet (0.4 mg total) under the tongue every 5 (five) minutes as needed. 25 tablet 2  . spironolactone (ALDACTONE) 25 MG tablet Take 0.5 tablets (12.5 mg total) by mouth daily. 45 tablet 3  . ticagrelor (BRILINTA) 90 MG TABS tablet Take 1 tablet (90 mg total) by mouth 2 (two) times daily. 180 tablet 3  . vitamin B-12 (CYANOCOBALAMIN) 1000 MCG tablet Take 1,000 mcg by mouth daily.      No current facility-administered medications for this visit.      No Known Allergies  REVIEW OF SYSTEMS (negative unless checked):   Cardiac:  []  Chest pain or chest pressure? [x]  Shortness of breath upon activity? []  Shortness of breath when lying flat? []  Irregular heart rhythm?  Vascular:  []  Pain in calf, thigh, or hip brought on by walking? []  Pain in feet at night that wakes you up from your sleep? []  Blood clot in your  veins? []  Leg swelling?  Pulmonary:  []  Oxygen at home? []  Productive cough? []  Wheezing?  Neurologic:  []  Sudden weakness in arms or legs? [x]  Sudden numbness in arms or legs? []  Sudden onset of difficult speaking or slurred speech? []  Temporary loss of vision in one eye? []  Problems with dizziness?  Gastrointestinal:  []  Blood in stool? []  Vomited blood?  Genitourinary:  []  Burning when urinating? []  Blood in urine?  Psychiatric:  []  Major depression  Hematologic:  []  Bleeding problems? []  Problems with blood clotting?  Dermatologic:  []  Rashes or ulcers?  Constitutional:  []   Fever or chills?  Ear/Nose/Throat:  []  Change in hearing? []  Nose bleeds? []  Sore throat?  Musculoskeletal:  []  Back pain? []  Joint pain? []  Muscle pain?   For VQI Use Only   PRE-ADM LIVING Home  AMB STATUS Ambulatory  CAD Sx History of MI, but no symptoms MI < 6 months ago  PRIOR CHF None  STRESS TEST Ischemia   Physical Examination     Vitals:   03/06/18 1032  BP: 130/70  Pulse: (!) 48  Resp: 18  Temp: (!) 97.4 F (36.3 C)  TempSrc: Oral  SpO2: 96%  Weight: 269 lb (122 kg)  Height: 5\' 9"  (1.753 m)   Body mass index is 39.72 kg/m.  General Alert, O x 3, Obese, NAD  Head [place]/AT,    Ear/Nose/ Throat Hearing grossly intact, nares without erythema or drainage, oropharynx without Erythema or Exudate, Mallampati score: 3,   Eyes PERRLA, EOMI,    Neck Supple, mid-line trachea,    Pulmonary Sym exp, good B air movt, CTA B  Cardiac RRR, Nl S1, S2, no Murmurs, No rubs, No S3,S4  Vascular Vessel Right Left  Radial Palpable Palpable  Brachial Palpable Palpable  Carotid Palpable, No Bruit Palpable, No Bruit  Aorta Not palpable N/A  Femoral Palpable Palpable  Popliteal Not palpable Not palpable  PT Not palpable Not palpable  DP Not palpable Not palpable    Gastro- intestinal soft, non-distended, non-tender to palpation, No guarding or rebound, no HSM, no masses, no  CVAT B, No palpable prominent aortic pulse, large pannus  Musculo- skeletal M/S 5/5 throughout  , Extremities without ischemic changes  , Non-pitting edema present: B 1-2+, No visible varicosities , Lipodermatosclerosis present: mild B  Neurologic Cranial nerves 2-12 intact , Pain and light touch intact in extremities , Motor exam as listed above  Psychiatric Judgement intact, Mood & affect appropriate for pt's clinical situation  Dermatologic See M/S exam for extremity exam, No rashes otherwise noted  Lymphatic  Palpable lymph nodes: None    Radiology     CTA abd/pelvis (02/03/18): VASCULAR  Abdominal aortic aneurysm measures 4.2 cm in diameter. Recommend followup by ultrasound in 1 year. This recommendation follows ACR consensus guidelines: White Paper of the ACR Incidental Findings Committee II on Vascular Findings. J Am Coll Radiol 2013; 10:789-794.  Right common iliac artery aneurysm measures 6.7 cm compared with 3.4 cm on the prior study.  Left common iliac artery aneurysm measures 5.8 cm compared with 2.8 cm on the prior study.  Significant narrowing at the origin of the left external iliac artery.  Bilateral superficial femoral artery occlusion.  NON-VASCULAR  No acute intra-abdominal pathology.  Lumbar spinal stenosis.  Based on my review of this patient's CTA, he has moderate sized AAA and bilateral large CIA.  Both internal iliac artery appeared heavily diseased, making neither suitable for IBE   Medical Decision Making   [name] is a 62 y.o. (10-Jun-1956) male  who presents with: asx Moderate sized AAA with bilateral large common iliac artery aneurysm, B PAD, B IIA atherosclerosis   I reviewed the CTA with my [employer] rep.  We both agree that IBE is likely not possible given the significant B IIA stenoses.    I suspect both may be in the process of occluding, but can't be certain with aortogram.  Patient needs staged bilateral internal iliac  artery embolization to avoid Type II endoleaks, will try to start with L IIA and stage the R IIA embolization one  month later to get pelvic collaterization.  After the second IIA embolization, will repair the AAA and both CIA aneuysms with EVAR and extension into B external iliac artery 2 weeks after the B IIA are embolization..  Given pt's history of short distal intermittent claudication, will get some additional studies: BLE ABI, B popliteal artery duplex, and Aortoiliac duplex.  I offered the patient first: aortogram, bilateral pelvic angiogram, possible L IIA embolization on 28 MAR 19. I discussed with the patient the nature of angiographic procedures, especially the limited patencies of any endovascular intervention.   The patient is aware of that the risks of an angiographic procedure include but are not limited to: bleeding, infection, access site complications, renal failure, embolization, rupture of vessel, dissection, arteriovenous fistula, possible need for emergent surgical intervention, possible need for surgical procedures to treat the patient's pathology, anaphylactic reaction to contrast, and stroke and death.   We discussed extensively that embolizing the L IIA can result in but not limited to: pelvic ischemia resulting in buttock claudication, colonic ischemia, erectile dynsfunction and spinal ischemia. The patient is aware of the risks and agrees to proceed.  I emphasized the importance of maximal medical management including strict control of blood pressure, blood glucose, and lipid levels, antiplatelet agents, obtaining regular exercise, and cessation of smoking.   Thank you for allowing [place] to participate in this patient's care.   Adele Barthel, MD, FACS [facility] Office: [phone] Pager: [phone]  03/05/2018, 11:45 AM

## 2018-03-06 ENCOUNTER — Other Ambulatory Visit: Payer: Self-pay

## 2018-03-06 ENCOUNTER — Encounter: Payer: Self-pay | Admitting: Vascular Surgery

## 2018-03-06 ENCOUNTER — Other Ambulatory Visit: Payer: Self-pay | Admitting: *Deleted

## 2018-03-06 ENCOUNTER — Encounter: Payer: Self-pay | Admitting: *Deleted

## 2018-03-06 ENCOUNTER — Ambulatory Visit (INDEPENDENT_AMBULATORY_CARE_PROVIDER_SITE_OTHER): Payer: BLUE CROSS/BLUE SHIELD | Admitting: Vascular Surgery

## 2018-03-06 VITALS — BP 130/70 | HR 48 | Temp 97.4°F | Resp 18 | Ht 69.0 in | Wt 269.0 lb

## 2018-03-06 DIAGNOSIS — I2111 ST elevation (STEMI) myocardial infarction involving right coronary artery: Secondary | ICD-10-CM

## 2018-03-06 DIAGNOSIS — I714 Abdominal aortic aneurysm, without rupture, unspecified: Secondary | ICD-10-CM

## 2018-03-06 DIAGNOSIS — I723 Aneurysm of iliac artery: Secondary | ICD-10-CM | POA: Diagnosis not present

## 2018-03-09 ENCOUNTER — Other Ambulatory Visit: Payer: Self-pay

## 2018-03-09 DIAGNOSIS — I723 Aneurysm of iliac artery: Secondary | ICD-10-CM

## 2018-03-09 DIAGNOSIS — E1151 Type 2 diabetes mellitus with diabetic peripheral angiopathy without gangrene: Secondary | ICD-10-CM

## 2018-03-10 ENCOUNTER — Telehealth: Payer: Self-pay | Admitting: Cardiology

## 2018-03-10 NOTE — Telephone Encounter (Signed)
Staff message conversation copied below for reference:   Wells Guiles - this request will be forwarded to our pre-operative clearance advance practice providers to review and advise. I personally do not handle such requests. It is best for clearance requests to be put in Epic as a phone note and routed to the provider/provider's office from whom you are requesting clearance so that it can be documented within the patient's chart and not solely in staff messages.   Thank you,  Eliezer Lofts RN  ----- Message ----- From: Willy Eddy, RN Sent: 03/10/2018  11:57 AM To: Fidel Levy, RN Subject: FW: Medication clearance/change in therapy       ----- Message ----- From: Willy Eddy, RN Sent: 03/09/2018   2:39 PM To: Minus Breeding, MD Subject: FW: Medication clearance/change in therapy       ----- Message ----- From: Willy Eddy, RN Sent: 03/06/2018  12:52 PM To: Minus Breeding, MD Subject: Medication clearance/change in therapy         Dr. Bridgett Larsson has scheduled Iliac Embolization for 03/19/18. Patient will need to be off [place] for 5 days prior to this procedure. Procedure can be safely performed with Plavix. Requestion clearance and assistance with this. Please Advise. Thank you, [place] RN [phone]

## 2018-03-10 NOTE — Telephone Encounter (Signed)
   [facility] Pre-operative Risk Assessment    Request for surgical clearance:  1. What type of surgery is being performed? Iliac embolization   2. When is this surgery scheduled? 03/19/18   3. What type of clearance is required (medical clearance vs. Pharmacy clearance to hold med vs. Both)? Both   4. Are there any medications that need to be held prior to surgery and how long? Brilinta - 5 days prior  5. Practice name and name of physician performing surgery? Dr. Bridgett Larsson @ [facility]   6. What is your office phone and fax number? Jacqlyn Larsen RN [phone] (not sure if phone or fax #)  7. Anesthesia type (None, local, MAC, general) ? Not specified    Fidel Levy 03/10/2018, 12:33 PM  _________________________________________________________________   (provider comments below)

## 2018-03-11 ENCOUNTER — Other Ambulatory Visit: Payer: Self-pay | Admitting: *Deleted

## 2018-03-11 ENCOUNTER — Telehealth: Payer: Self-pay | Admitting: *Deleted

## 2018-03-11 MED ORDER — CLOPIDOGREL BISULFATE 75 MG PO TABS: 75.0000 mg | ORAL_TABLET | Freq: Every day | ORAL | 11 refills | Status: DC

## 2018-03-11 NOTE — Telephone Encounter (Signed)
-----   Message from Minus Breeding, MD sent at 03/10/2018  5:20 PM EDT ----- Regarding: RE: Medication clearance/change in therapy This note is not part of the permanent records.   This request should to be sent in a format other than staff message.  I did hear back from Dr. Bridgett Larsson.  He cannot quantify the risk of aneurysmal rupture without repair.  However, he believes that this risk will be high without treatment.  Therefore, the patient can proceed with the percutaneous treatment.  He will need to be started on Plavix 75 mg po daily.  Brilinta will need to be stopped.  The planned procedure does not sound like it will be high risk and in particular will not have a high risk of blood loss.  Dr. Percival Spanish   ----- Message ----- From: Willy Eddy, RN Sent: 03/09/2018   2:39 PM To: Minus Breeding, MD Subject: [place]ton Alar: Medication clearance/change in therapy       ----- Message ----- From: Willy Eddy, RN Sent: 03/06/2018  12:52 PM To: Minus Breeding, MD Subject: Medication clearance/change in therapy         Dr. Bridgett Larsson has scheduled Iliac Embolization for 03/19/18. Patient will need to be off [place] for 5 days prior to this procedure. Procedure can be safely performed with Plavix. Requestion clearance and assistance with this. Please Advise. Thank you, [place] RN [phone]

## 2018-03-11 NOTE — Telephone Encounter (Signed)
Instructed to hold the [place] for 5 days and use Plavix for 5 days prior to procedure. Rx sent to patient 's pharmacy ([facility]). Instructed to resume the Brillinta as instructed by Dr. Bridgett Larsson.

## 2018-03-12 ENCOUNTER — Ambulatory Visit (HOSPITAL_COMMUNITY)
Admission: RE | Admit: 2018-03-12 | Discharge: 2018-03-12 | Disposition: A | Discharge status: Home / Self Care | Payer: BLUE CROSS/BLUE SHIELD | Source: Ambulatory Visit | Attending: Vascular Surgery | Admitting: Vascular Surgery

## 2018-03-12 DIAGNOSIS — E669 Obesity, unspecified: Secondary | ICD-10-CM | POA: Insufficient documentation

## 2018-03-12 DIAGNOSIS — E1151 Type 2 diabetes mellitus with diabetic peripheral angiopathy without gangrene: Secondary | ICD-10-CM

## 2018-03-12 DIAGNOSIS — I2510 Atherosclerotic heart disease of native coronary artery without angina pectoris: Secondary | ICD-10-CM | POA: Insufficient documentation

## 2018-03-12 DIAGNOSIS — I252 Old myocardial infarction: Secondary | ICD-10-CM | POA: Insufficient documentation

## 2018-03-12 DIAGNOSIS — I77811 Abdominal aortic ectasia: Secondary | ICD-10-CM | POA: Diagnosis not present

## 2018-03-12 DIAGNOSIS — E785 Hyperlipidemia, unspecified: Secondary | ICD-10-CM | POA: Diagnosis not present

## 2018-03-12 DIAGNOSIS — I723 Aneurysm of iliac artery: Secondary | ICD-10-CM | POA: Diagnosis not present

## 2018-03-13 ENCOUNTER — Ambulatory Visit (HOSPITAL_COMMUNITY)
Admission: RE | Admit: 2018-03-13 | Discharge: 2018-03-13 | Disposition: A | Discharge status: Home / Self Care | Payer: BLUE CROSS/BLUE SHIELD | Source: Ambulatory Visit | Attending: Vascular Surgery | Admitting: Vascular Surgery

## 2018-03-13 ENCOUNTER — Ambulatory Visit (INDEPENDENT_AMBULATORY_CARE_PROVIDER_SITE_OTHER)
Admission: RE | Admit: 2018-03-13 | Discharge: 2018-03-13 | Disposition: A | Discharge status: Home / Self Care | Payer: BLUE CROSS/BLUE SHIELD | Source: Ambulatory Visit | Attending: Vascular Surgery | Admitting: Vascular Surgery

## 2018-03-13 DIAGNOSIS — I2510 Atherosclerotic heart disease of native coronary artery without angina pectoris: Secondary | ICD-10-CM | POA: Diagnosis not present

## 2018-03-13 DIAGNOSIS — I10 Essential (primary) hypertension: Secondary | ICD-10-CM | POA: Diagnosis not present

## 2018-03-13 DIAGNOSIS — Z794 Long term (current) use of insulin: Secondary | ICD-10-CM | POA: Insufficient documentation

## 2018-03-13 DIAGNOSIS — R9389 Abnormal findings on diagnostic imaging of other specified body structures: Secondary | ICD-10-CM | POA: Diagnosis not present

## 2018-03-13 DIAGNOSIS — I724 Aneurysm of artery of lower extremity: Secondary | ICD-10-CM | POA: Diagnosis not present

## 2018-03-13 DIAGNOSIS — R0989 Other specified symptoms and signs involving the circulatory and respiratory systems: Secondary | ICD-10-CM | POA: Diagnosis not present

## 2018-03-13 DIAGNOSIS — E1151 Type 2 diabetes mellitus with diabetic peripheral angiopathy without gangrene: Secondary | ICD-10-CM | POA: Diagnosis not present

## 2018-03-19 ENCOUNTER — Observation Stay (HOSPITAL_COMMUNITY)
Admission: RE | Admit: 2018-03-19 | Discharge: 2018-03-20 | Disposition: A | Discharge status: Home / Self Care | Payer: BLUE CROSS/BLUE SHIELD | Source: Ambulatory Visit | Attending: Vascular Surgery | Admitting: Vascular Surgery

## 2018-03-19 ENCOUNTER — Other Ambulatory Visit: Payer: Self-pay

## 2018-03-19 ENCOUNTER — Encounter (HOSPITAL_COMMUNITY): Payer: Self-pay | Admitting: General Practice

## 2018-03-19 ENCOUNTER — Encounter (HOSPITAL_COMMUNITY)
Admission: RE | Disposition: A | Discharge status: Home / Self Care | Payer: Self-pay | Source: Ambulatory Visit | Attending: Vascular Surgery

## 2018-03-19 DIAGNOSIS — Z7982 Long term (current) use of aspirin: Secondary | ICD-10-CM | POA: Diagnosis not present

## 2018-03-19 DIAGNOSIS — G8929 Other chronic pain: Secondary | ICD-10-CM | POA: Insufficient documentation

## 2018-03-19 DIAGNOSIS — F17210 Nicotine dependence, cigarettes, uncomplicated: Secondary | ICD-10-CM | POA: Diagnosis not present

## 2018-03-19 DIAGNOSIS — I10 Essential (primary) hypertension: Secondary | ICD-10-CM | POA: Insufficient documentation

## 2018-03-19 DIAGNOSIS — G4730 Sleep apnea, unspecified: Secondary | ICD-10-CM | POA: Insufficient documentation

## 2018-03-19 DIAGNOSIS — I428 Other cardiomyopathies: Secondary | ICD-10-CM | POA: Diagnosis not present

## 2018-03-19 DIAGNOSIS — M549 Dorsalgia, unspecified: Secondary | ICD-10-CM | POA: Diagnosis not present

## 2018-03-19 DIAGNOSIS — Z7902 Long term (current) use of antithrombotics/antiplatelets: Secondary | ICD-10-CM | POA: Insufficient documentation

## 2018-03-19 DIAGNOSIS — I723 Aneurysm of iliac artery: Principal | ICD-10-CM | POA: Insufficient documentation

## 2018-03-19 DIAGNOSIS — I714 Abdominal aortic aneurysm, without rupture, unspecified: Secondary | ICD-10-CM | POA: Diagnosis present

## 2018-03-19 DIAGNOSIS — Z95810 Presence of automatic (implantable) cardiac defibrillator: Secondary | ICD-10-CM | POA: Insufficient documentation

## 2018-03-19 DIAGNOSIS — Z955 Presence of coronary angioplasty implant and graft: Secondary | ICD-10-CM | POA: Insufficient documentation

## 2018-03-19 DIAGNOSIS — E1151 Type 2 diabetes mellitus with diabetic peripheral angiopathy without gangrene: Secondary | ICD-10-CM | POA: Diagnosis not present

## 2018-03-19 DIAGNOSIS — J449 Chronic obstructive pulmonary disease, unspecified: Secondary | ICD-10-CM | POA: Diagnosis not present

## 2018-03-19 DIAGNOSIS — I745 Embolism and thrombosis of iliac artery: Secondary | ICD-10-CM | POA: Diagnosis present

## 2018-03-19 DIAGNOSIS — K219 Gastro-esophageal reflux disease without esophagitis: Secondary | ICD-10-CM | POA: Insufficient documentation

## 2018-03-19 DIAGNOSIS — M1990 Unspecified osteoarthritis, unspecified site: Secondary | ICD-10-CM | POA: Diagnosis not present

## 2018-03-19 DIAGNOSIS — I2510 Atherosclerotic heart disease of native coronary artery without angina pectoris: Secondary | ICD-10-CM | POA: Diagnosis not present

## 2018-03-19 HISTORY — PX: EMBOLIZATION: SHX1496

## 2018-03-19 HISTORY — PX: EMBOLIZATION: CATH118239

## 2018-03-19 LAB — POCT I-STAT, CHEM 8
BUN: 14 mg/dL (ref 6–20)
CHLORIDE: 100 mmol/L — AB (ref 101–111)
CREATININE: 0.6 mg/dL — AB (ref 0.61–1.24)
Calcium, Ion: 1.22 mmol/L (ref 1.15–1.40)
Glucose, Bld: 201 mg/dL — ABNORMAL HIGH (ref 65–99)
HEMATOCRIT: 38 % — AB (ref 39.0–52.0)
HEMOGLOBIN: 12.9 g/dL — AB (ref 13.0–17.0)
POTASSIUM: 4.2 mmol/L (ref 3.5–5.1)
Sodium: 138 mmol/L (ref 135–145)
TCO2: 26 mmol/L (ref 22–32)

## 2018-03-19 LAB — CBC
HEMATOCRIT: 39 % (ref 39.0–52.0)
Hemoglobin: 13.5 g/dL (ref 13.0–17.0)
MCH: 28.7 pg (ref 26.0–34.0)
MCHC: 34.6 g/dL (ref 30.0–36.0)
MCV: 83 fL (ref 78.0–100.0)
PLATELETS: 245 10*3/uL (ref 150–400)
RBC: 4.7 MIL/uL (ref 4.22–5.81)
RDW: 13.3 % (ref 11.5–15.5)
WBC: 8.8 10*3/uL (ref 4.0–10.5)

## 2018-03-19 LAB — GLUCOSE, CAPILLARY
GLUCOSE-CAPILLARY: 102 mg/dL — AB (ref 65–99)
GLUCOSE-CAPILLARY: 141 mg/dL — AB (ref 65–99)
Glucose-Capillary: 162 mg/dL — ABNORMAL HIGH (ref 65–99)

## 2018-03-19 LAB — CREATININE, SERUM
Creatinine, Ser: 0.69 mg/dL (ref 0.61–1.24)
GFR calc non Af Amer: >60 mL/min (ref >60)

## 2018-03-19 SURGERY — EMBOLIZATION
Anesthesia: LOCAL | Laterality: Left

## 2018-03-19 MED ORDER — SODIUM CHLORIDE 0.9% FLUSH
3.0000 mL | Freq: Two times a day (BID) | INTRAVENOUS | Status: DC
  Administered 2018-03-19: 3 mL via INTRAVENOUS

## 2018-03-19 MED ORDER — LIDOCAINE HCL 1 % IJ SOLN
INTRAMUSCULAR | Status: AC
  Filled 2018-03-19: qty 20

## 2018-03-19 MED ORDER — ALUM & MAG HYDROXIDE-SIMETH 200-200-20 MG/5ML PO SUSP: 15.0000 mL | ORAL | Status: DC | PRN

## 2018-03-19 MED ORDER — MIDAZOLAM HCL 2 MG/2ML IJ SOLN
INTRAMUSCULAR | Status: DC | PRN
  Administered 2018-03-19: 1 mg via INTRAVENOUS

## 2018-03-19 MED ORDER — HEPARIN (PORCINE) IN NACL 2-0.9 UNIT/ML-% IJ SOLN
INTRAMUSCULAR | Status: AC | PRN
  Administered 2018-03-19 (×2): 500 mL

## 2018-03-19 MED ORDER — METOPROLOL TARTRATE 5 MG/5ML IV SOLN: 2.0000 mg | INTRAVENOUS | Status: DC | PRN

## 2018-03-19 MED ORDER — ONDANSETRON HCL 4 MG/2ML IJ SOLN: 4.0000 mg | Freq: Four times a day (QID) | INTRAMUSCULAR | Status: DC | PRN

## 2018-03-19 MED ORDER — SODIUM CHLORIDE 0.9 % WEIGHT BASED INFUSION
1.0000 mL/kg/h | INTRAVENOUS | Status: AC
  Administered 2018-03-19: 1 mL/kg/h via INTRAVENOUS

## 2018-03-19 MED ORDER — LABETALOL HCL 5 MG/ML IV SOLN: 10.0000 mg | INTRAVENOUS | Status: DC | PRN

## 2018-03-19 MED ORDER — HYDRALAZINE HCL 20 MG/ML IJ SOLN: 5.0000 mg | INTRAMUSCULAR | Status: DC | PRN

## 2018-03-19 MED ORDER — FENTANYL CITRATE (PF) 100 MCG/2ML IJ SOLN
INTRAMUSCULAR | Status: DC | PRN
  Administered 2018-03-19: 50 ug via INTRAVENOUS

## 2018-03-19 MED ORDER — LIDOCAINE HCL (PF) 1 % IJ SOLN
INTRAMUSCULAR | Status: DC | PRN
  Administered 2018-03-19: 20 mL

## 2018-03-19 MED ORDER — FENTANYL CITRATE (PF) 100 MCG/2ML IJ SOLN
INTRAMUSCULAR | Status: AC
  Filled 2018-03-19: qty 2

## 2018-03-19 MED ORDER — MIDAZOLAM HCL 2 MG/2ML IJ SOLN
INTRAMUSCULAR | Status: AC
  Filled 2018-03-19: qty 2

## 2018-03-19 MED ORDER — SODIUM CHLORIDE 0.9 % IV SOLN
INTRAVENOUS | Status: DC
  Administered 2018-03-19: 06:00:00 via INTRAVENOUS

## 2018-03-19 MED ORDER — IODIXANOL 320 MG/ML IV SOLN
INTRAVENOUS | Status: DC | PRN
  Administered 2018-03-19: 120 mL via INTRA_ARTERIAL

## 2018-03-19 MED ORDER — HEPARIN (PORCINE) IN NACL 2-0.9 UNIT/ML-% IJ SOLN
INTRAMUSCULAR | Status: AC
  Filled 2018-03-19: qty 1000

## 2018-03-19 MED ORDER — ENOXAPARIN SODIUM 40 MG/0.4ML ~~LOC~~ SOLN
40.0000 mg | SUBCUTANEOUS | Status: DC
  Administered 2018-03-19: 40 mg via SUBCUTANEOUS
  Filled 2018-03-19: qty 0.4

## 2018-03-19 MED ORDER — SODIUM CHLORIDE 0.9 % IV SOLN: 250.0000 mL | INTRAVENOUS | Status: DC | PRN

## 2018-03-19 MED ORDER — PANTOPRAZOLE SODIUM 40 MG PO TBEC
40.0000 mg | DELAYED_RELEASE_TABLET | Freq: Every day | ORAL | Status: DC
  Administered 2018-03-19: 40 mg via ORAL
  Filled 2018-03-19: qty 1

## 2018-03-19 MED ORDER — SODIUM CHLORIDE 0.9% FLUSH: 3.0000 mL | INTRAVENOUS | Status: DC | PRN

## 2018-03-19 MED ORDER — ACETAMINOPHEN 325 MG PO TABS: 650.0000 mg | ORAL_TABLET | ORAL | Status: DC | PRN

## 2018-03-19 SURGICAL SUPPLY — 13 items
CATH ANGIO 5F BER2 65CM (CATHETERS) ×2 IMPLANT
CATH OMNI FLUSH 5F 65CM (CATHETERS) ×2 IMPLANT
CATH STRAIGHT 5FR 65CM (CATHETERS) ×2 IMPLANT
COIL NESTER 14X6 (Embolic) ×6 IMPLANT
DEVICE TORQUE .025-.038 (MISCELLANEOUS) ×2 IMPLANT
GUIDEWIRE ANGLED .035X260CM (WIRE) ×2 IMPLANT
KIT MICROPUNCTURE NIT STIFF (SHEATH) ×2 IMPLANT
KIT PV (KITS) ×2 IMPLANT
SHEATH AVANTI 11CM 5FR (SHEATH) ×2 IMPLANT
SYR MEDRAD MARK V 150ML (SYRINGE) ×2 IMPLANT
TRANSDUCER W/STOPCOCK (MISCELLANEOUS) ×2 IMPLANT
TRAY PV CATH (CUSTOM PROCEDURE TRAY) ×2 IMPLANT
WIRE BENTSON .035X145CM (WIRE) ×2 IMPLANT

## 2018-03-19 NOTE — Progress Notes (Addendum)
Site area: LFA Site Prior to Removal:  Level 0 Pressure Applied For:23 min Manual: yes   Patient Status During Pull:  stable Post Pull Site:  Level Post Pull Instructions Given:  yes Post Pull Pulses Present: doppler  Dressing Applied:  tegaderm Bedrest begins @ 1003 till 1315 Comments:William Council/canderson

## 2018-03-19 NOTE — Progress Notes (Signed)
Pt received from cath lab. L groin site level 0. Dopplered pulses in both feet. Telemetry applied. CHG complete. Pt and family oriented to room. Pt family brought lunch, did not want tray ordered. VSS. Call light in reach. Will continue to monitor.  Clyde Canterbury, RN

## 2018-03-19 NOTE — Interval H&P Note (Signed)
   History and Physical Update  The patient was interviewed and re-examined.  The patient's previous History and Physical has been reviewed and is unchanged from my consult.  There is no change in the plan of care: aortogram, bilateral pelvic angiogram, possible left or right internal iliac artery embolization.  The patient is aware of that the risks of an angiographic procedure include but are not limited to: bleeding, infection, access site complications, renal failure, embolization, rupture of vessel, dissection, arteriovenous fistula, possible need for emergent surgical intervention, possible need for surgical procedures to treat the patient's pathology, anaphylactic reaction to contrast, and stroke and death.   We discussed extensively that embolizing the IIA can result in but not limited to: pelvic ischemia resulting in buttock claudication, colonic ischemia, erectile dynsfunction and spinal ischemia. The patient is aware of the risks and agrees to proceed.  Adele Barthel, MD, FACS [facility] Office: [phone] Pager: [phone]  03/19/2018, 6:53 AM

## 2018-03-19 NOTE — Op Note (Addendum)
OPERATIVE NOTE   PROCEDURE: 1.  Left common femoral artery cannulation under ultrasound guidance 2.  Placement of catheter in aorta 3.  Aortogram 4.  Limited bilateral leg runoff 5.  Selection of left internal iliac artery  6.  Embolization of left internal iliac artery x 3 (Nestor 6 mm x 3) 7.  Conscious sedation for 33 minutes  PRE-OPERATIVE DIAGNOSIS: bilateral large common iliac artery aneurysm, moderate abdominal aortic aneurysm   POST-OPERATIVE DIAGNOSIS: same as above   SURGEON: Adele Barthel, MD  ANESTHESIA: conscious sedation  ESTIMATED BLOOD LOSS: 50 cc  CONTRAST: 120 cc  FINDING(S):  Aorta: tortuous aorta  Superior mesenteric artery: patent Celiac artery: patent   Right Left  RA patent Patent upper and lower pole renal artery, lower pole originates on distal aorta  CIA Large calcified aneurysm, patent Large calcified aneurysm, patent    EIA Patent, proximal stenosis ~50% Patent, proximal stenosis <50%  IIA Take off not well visualized, somewhat diseased main artery Bifurcation at takeoff, main arterial embolized with subsequent encroachment of proximal branch and subsequent thrombosis  CFA patent patent   SPECIMEN(S):  none  INDICATIONS:   [name] is a 62 y.o. male who presents with moderate size abdominal aortic aneurysm and large bilateral common iliac artery aneurysms.  Due the anatomy and prior abdominal procedures, I felt this patient's three aneurysm were best managed endovascularly.  This would require staged internal iliac artery embolization followed by endovascular aortic repair.  The patient presents for: aortogram, left internal iliac artery embolization.  I discussed with the patient the nature of angiographic procedures, especially the limited patencies of any endovascular intervention.  The patient is aware of that the risks of an angiographic procedure include but are not limited to: bleeding, infection, access site complications, renal  failure, embolization, rupture of vessel, dissection, possible need for emergent surgical intervention, possible need for surgical procedures to treat the patient's pathology, and stroke and death.  I emphasized the risk of pelvic ischemia given the internal iliac artery was going to be embolized.  The patient is aware of the risks and agrees to proceed.  DESCRIPTION: After full informed consent was obtained from the patient, the patient was brought back to the angiography suite.  The patient was placed supine upon the angiography table and connected to cardiopulmonary monitoring equipment.  The patient was then given conscious sedation, the amounts of which are documented in the patient's chart.  A circulating radiologic technician maintained continuous monitoring of the patient's cardiopulmonary status.  Additionally, the control room radiologic technician provided backup monitoring throughout the procedure.  The patient was prepped and drape in the standard fashion for an angiographic procedure.  At this point, attention was turned to the left groin.  Under ultrasound guidance, the subcutaneous tissue surrounding the left common femoral artery was anesthesized with 1% lidocaine with epinephrine.  The artery was then cannulated with a micropuncture needle.  The microwire was advanced into the iliac arterial system.  The needle was exchanged for a microsheath, which was loaded into the common femoral artery over the wire.  The microwire was exchanged for a Bentson wire which coiled at the proximal external iliac artery.  The microsheath was then exchanged for a 5-Fr sheath which was loaded into the common femoral artery.  I loaded a straight catheter over the wire.  The wire continued to coil, so I removed the wire and did a hand injection this demonstrated ~50% stenosis in the proximal external iliac artery.  A glidewire was placed and used to steer through the stenosis into the common iliac artery.  Using  this combination, I was able to get into the suprarenal aorta.  This was somewhat challenging due to the tortuosity.  The Omniflush catheter was then loaded over the wire up to the level of L1.  The catheter was connected to the power injector circuit.  After de-airring and de-clotting the circuit, a power injector aortogram was completed.  I pulled the catheter down to the distal aorta.  LAO and RAO pelvic angiograms were obtained.  The findings are listed above.  I pulled the catheter into the left common iliac artery aneurysm and did a RAO injection which splayed out the internal iliac artery takeoff.  There appeared to be one main internal iliac artery with a branch coming off nearly at the take off.  I planned on embolizing the proximal 2-3 cm of the internal iliac artery.  I exchanged the catheter for a BER-2 catheter and then exchanged the wire for a Glidewire.  Using this combination, I got into the internal iliac artery and advanced into the mid-segment.  I placed 3 6 mm Nestor coils so that the overlap extended into the proximal 2-3 of the internal iliac artery.  After waiting a few minutes, there was limited backbleeding from within the internal iliac artery.  I pulled the catheter back into the external iliac artery and readvanced the wire into the left common iliac artery.  Completion injection demonstrated occlusion of the proximal internal iliac artery with compromised and delay filling of the proximal branch.  I expect this branch to thrombose by the time of the endovascular aortic repair.  All wire and catheters were removed.  The sheath was aspirated.  No clots were present and the sheath was reloaded with heparinized saline.     COMPLICATIONS: none  CONDITION: stable   Adele Barthel, MD, [facility] [place] Office: [phone] Pager: [phone]  03/19/2018, 8:35 AM

## 2018-03-20 ENCOUNTER — Encounter (HOSPITAL_COMMUNITY): Payer: Self-pay | Admitting: Vascular Surgery

## 2018-03-20 DIAGNOSIS — I714 Abdominal aortic aneurysm, without rupture: Secondary | ICD-10-CM | POA: Diagnosis not present

## 2018-03-20 DIAGNOSIS — G8929 Other chronic pain: Secondary | ICD-10-CM | POA: Diagnosis not present

## 2018-03-20 DIAGNOSIS — E1151 Type 2 diabetes mellitus with diabetic peripheral angiopathy without gangrene: Secondary | ICD-10-CM | POA: Diagnosis not present

## 2018-03-20 DIAGNOSIS — Z95810 Presence of automatic (implantable) cardiac defibrillator: Secondary | ICD-10-CM | POA: Diagnosis not present

## 2018-03-20 DIAGNOSIS — I428 Other cardiomyopathies: Secondary | ICD-10-CM | POA: Diagnosis not present

## 2018-03-20 DIAGNOSIS — I2510 Atherosclerotic heart disease of native coronary artery without angina pectoris: Secondary | ICD-10-CM | POA: Diagnosis not present

## 2018-03-20 DIAGNOSIS — M549 Dorsalgia, unspecified: Secondary | ICD-10-CM | POA: Diagnosis not present

## 2018-03-20 DIAGNOSIS — K219 Gastro-esophageal reflux disease without esophagitis: Secondary | ICD-10-CM | POA: Diagnosis not present

## 2018-03-20 DIAGNOSIS — M1990 Unspecified osteoarthritis, unspecified site: Secondary | ICD-10-CM | POA: Diagnosis not present

## 2018-03-20 DIAGNOSIS — I10 Essential (primary) hypertension: Secondary | ICD-10-CM | POA: Diagnosis not present

## 2018-03-20 DIAGNOSIS — J449 Chronic obstructive pulmonary disease, unspecified: Secondary | ICD-10-CM | POA: Diagnosis not present

## 2018-03-20 DIAGNOSIS — Z7902 Long term (current) use of antithrombotics/antiplatelets: Secondary | ICD-10-CM | POA: Diagnosis not present

## 2018-03-20 DIAGNOSIS — G4730 Sleep apnea, unspecified: Secondary | ICD-10-CM | POA: Diagnosis not present

## 2018-03-20 DIAGNOSIS — F17210 Nicotine dependence, cigarettes, uncomplicated: Secondary | ICD-10-CM | POA: Diagnosis not present

## 2018-03-20 DIAGNOSIS — I723 Aneurysm of iliac artery: Secondary | ICD-10-CM | POA: Diagnosis not present

## 2018-03-20 DIAGNOSIS — Z7982 Long term (current) use of aspirin: Secondary | ICD-10-CM | POA: Diagnosis not present

## 2018-03-20 LAB — BASIC METABOLIC PANEL
Anion gap: 11 (ref 5–15)
BUN: 12 mg/dL (ref 6–20)
CALCIUM: 8.7 mg/dL — AB (ref 8.9–10.3)
CO2: 24 mmol/L (ref 22–32)
CREATININE: 0.76 mg/dL (ref 0.61–1.24)
Chloride: 101 mmol/L (ref 101–111)
GFR calc Af Amer: >60 mL/min (ref >60)
Glucose, Bld: 144 mg/dL — ABNORMAL HIGH (ref 65–99)
POTASSIUM: 4 mmol/L (ref 3.5–5.1)
SODIUM: 136 mmol/L (ref 135–145)

## 2018-03-20 LAB — CBC
HCT: 38.1 % — ABNORMAL LOW (ref 39.0–52.0)
Hemoglobin: 13 g/dL (ref 13.0–17.0)
MCH: 28.4 pg (ref 26.0–34.0)
MCHC: 34.1 g/dL (ref 30.0–36.0)
MCV: 83.2 fL (ref 78.0–100.0)
PLATELETS: 217 10*3/uL (ref 150–400)
RBC: 4.58 MIL/uL (ref 4.22–5.81)
RDW: 13.5 % (ref 11.5–15.5)
WBC: 9.3 10*3/uL (ref 4.0–10.5)

## 2018-03-20 LAB — HIV ANTIBODY (ROUTINE TESTING W REFLEX): HIV Screen 4th Generation wRfx: NONREACTIVE

## 2018-03-20 MED FILL — Lidocaine HCl Local Inj 1%: INTRAMUSCULAR | Qty: 20 | Status: AC

## 2018-03-20 MED FILL — Heparin Sodium (Porcine) 2 Unit/ML in Sodium Chloride 0.9%: INTRAMUSCULAR | Qty: 1000 | Status: AC

## 2018-03-20 NOTE — Progress Notes (Signed)
Discussed with the patient and all questioned fully answered. He will call me if any problems arise.  IV removed. Telemetry removed, CCMD notified. Pt prefers to take his morning medications at home. Given printed discharge paperwork in discharge envelope.  L groin site clean, dry. Pt verbalized understanding of teaching on s/s of infection.   Fritz Pickerel, RN

## 2018-03-20 NOTE — Progress Notes (Addendum)
[facility] [place]  Subjective  -  Doing well, no abdominal pain or lumbar pain.   Objective 132/66 89 98.4 F (36.9 C) (Oral) 15 93%  Intake/Output Summary (Last 24 hours) at 03/20/2018 6599 Last data filed at 03/20/2018 0000 Gross per 24 hour  Intake 2096.8 ml  Output 1275 ml  Net 821.8 ml    B feet warm and well perfused Strength 5/5 Abdomin soft NTTP Left groin soft without hematoma Heart RRR   Assessment/Planning: POD # 1 Embolization of left internal iliac artery x 3 (Nestor 6 mm x 3)   Patients disposition stable for discharge home He has voided, tolerating PO's and has asymptomatic Abdominal aortic aneurysm measures 4.2 cm in diameter.  Right common iliac artery aneurysm measures 6.7 cm compared with 3.4 cm on the prior study.     Plan stage the R IIA embolization one month later to get pelvic collaterization.  After the second IIA embolization, will repair the AAA and both CIA aneuysms with EVAR and extension into B external iliac artery 2 weeks after the B IIA are embolization..  Return next month for second stage procedure plan in place per Dr. Bridgett Larsson.   [name] 03/20/2018 7:12 AM --  Laboratory Lab Results: Recent Labs    03/19/18 1502 03/20/18 0321  WBC 8.8 9.3  HGB 13.5 13.0  HCT 39.0 38.1*  PLT 245 217   BMET Recent Labs    03/19/18 0607 03/19/18 1502 03/20/18 0321  NA 138  --  136  K 4.2  --  4.0  CL 100*  --  101  CO2  --   --  24  GLUCOSE 201*  --  144*  BUN 14  --  12  CREATININE 0.60* 0.69 0.76  CALCIUM  --   --  8.7*    COAG Lab Results  Component Value Date   INR 0.99 01/09/2018   INR 0.9 09/30/2011   No results found for: PTT   Addendum  I have independently interviewed and examined the patient, and I agree with the physician assistant's findings.  No abd pain.  No bloody stools.  Tolerating PO.  No buttock claudication.  No groin hematoma.   R IIA embolization in one  month.  Will perform a L renal angiogram then to determine what portion of the kidney is perfused by the main artery vs accessory renal  Ok to d/c  Adele Barthel, MD, [facility] [place] Office: [phone] Pager: [phone]  03/20/2018, 7:50 AM

## 2018-03-20 NOTE — Discharge Instructions (Signed)
   [facility] [place]   Discharge Instructions  Endovascular Aortic Aneurysm Repair  Please refer to the following instructions for your post-procedure care. Your surgeon or Physician Assistant will discuss any changes with you.  Activity  You are encouraged to walk as much as you can. You can slowly return to normal activities but must avoid strenuous activity and heavy lifting until your doctor tells you it's OK. Avoid activities such as vacuuming or swinging a gold club. It is normal to feel tired for several weeks after your surgery. Do not drive until your doctor gives the OK and you are no longer taking prescription pain medications. It is also normal to have difficulty with sleep habits, eating, and bowel movements after surgery. These will go away with time.  Bathing/Showering  You may shower after you go home. If you have an incision, do not soak in a bathtub, hot tub, or swim until the incision heals completely.  Incision Care  Shower every day. Clean your incision with mild soap and water. Pat the area dry with a clean towel. You do not need a bandage unless otherwise instructed. Do not apply any ointments or creams to your incision. If you clothing is irritating, you may cover your incision with a dry gauze pad.  Diet  Resume your normal diet. There are no special food restrictions following this procedure. A low fat/low cholesterol diet is recommended for all patients with vascular disease. In order to heal from your surgery, it is CRITICAL to get adequate nutrition. Your body requires vitamins, minerals, and protein. Vegetables are the best source of vitamins and minerals. Vegetables also provide the perfect balance of protein. Processed food has little nutritional value, so try to avoid this.  Medications  Resume taking all of your medications unless your doctor or nurse practitioner tells you not to. If your incision is causing pain, you may take  over-the-counter pain relievers such as acetaminophen (Tylenol). If you were prescribed a stronger pain medication, please be aware these medications can cause nausea and constipation. Prevent nausea by taking the medication with a snack or meal. Avoid constipation by drinking plenty of fluids and eating foods with a high amount of fiber, such as fruits, vegetables, and grains. Do not take Tylenol if you are taking prescription pain medications.   Follow up  Our office will schedule a follow-up appointment with a C.T. scan 3-4 weeks after your surgery.  Please call us immediately for any of the following conditions  Severe or worsening pain in your legs or feet or in your abdomen back or chest. Increased pain, redness, drainage (pus) from your incision sit. Increased abdominal pain, bloating, nausea, vomiting or persistent diarrhea. Fever of 101 degrees or higher. Swelling in your leg (s),  Reduce your risk of vascular disease  Stop smoking. If you would like help call [facility] at 1-800-QUIT-NOW (1-800-784-8669) or [place] at 336-586-4000. Manage your cholesterol Maintain a desired weight Control your diabetes Keep your blood pressure down  If you have questions, please call the office at [phone].   

## 2018-03-25 NOTE — Discharge Summary (Addendum)
[facility] Discharge Summary   Patient ID:  [name] MRN: [id number] DOB/AGE: 07-19-1956 62 y.o.  Admit date: 03/19/2018 Discharge date: 03/20/2018 Date of Surgery: 03/19/2018 Surgeon: Juliann Mule): Conrad Delta, MD  Admission Diagnosis: illac artery anorizum  Discharge Diagnoses:  illac artery anorizum  Secondary Diagnoses: Past Medical History:  Diagnosis Date  . Arthritis   . Automatic implantable cardioverter-defibrillator in situ   . Benign essential HTN 09/17/2016  . COPD (chronic obstructive pulmonary disease) ([place])    emphysema by CXR  . Coronary artery disease     LAD a 60-70% stenosis, moderate size second diagonal with 50% stenosis, circumflex AV groove 75-80% stenosis with a branching obtuse marginal with a superior branch subtotal stenosis followed by diffuse disease, right coronary artery  had nonobstructive disease.  EF was 35%  . Diabetes mellitus    type 2  . Diverticulosis of colon   . GERD (gastroesophageal reflux disease)    with esophagitis  . History of colonic polyps   . Hypertension   . Non-ischemic cardiomyopathy ([place])   . Presence of permanent cardiac pacemaker   . PVD (peripheral vascular disease) ([place])    bilateral common iliac artery aneurysms. Right SFA occlusion over a long segment. Left SFA disease with occlusion of the left TP trunk. Artirogram Oct. 2006  . Shortness of breath    exertion  . Sleep apnea   . Tobacco abuse   . Urinary incontinence    detrussor instability    Procedure(s): EMBOLIZATION - Left Internal  Discharged Condition: good  HPI:    Hospital Course:  [name] is a 62 y.o. male is S/P Procedure(s): EMBOLIZATION - Left Internal Iliac artery  No abdominal pain, No buttock claudication.  No groin hematoma.    Patients disposition stable for discharge home  He has voided, tolerating PO's and has asymptomatic Abdominal aortic aneurysm measures 4.2 cm in diameter. Right common iliac  artery aneurysm measures 6.7 cm compared with 3.4 cm on the prior study.  Plan stage the R IIA embolization one month later to get pelvic collaterization.  After the second IIA embolization, will repair the AAA and both CIA aneuysms with EVAR and extension into B external iliac artery 2 weeks after the B IIA are embolization.. Return next month for second stage procedure plan in place per Dr. Bridgett Larsson.     Significant Diagnostic Studies: CBC Lab Results  Component Value Date   WBC 9.3 03/20/2018   HGB 13.0 03/20/2018   HCT 38.1 (L) 03/20/2018   MCV 83.2 03/20/2018   PLT 217 03/20/2018    BMET    Component Value Date/Time   NA 136 03/20/2018 0321   K 4.0 03/20/2018 0321   CL 101 03/20/2018 0321   CO2 24 03/20/2018 0321   GLUCOSE 144 (H) 03/20/2018 0321   GLUCOSE 97 01/02/2007 0000   BUN 12 03/20/2018 0321   CREATININE 0.76 03/20/2018 0321   CALCIUM 8.7 (L) 03/20/2018 0321   GFRNONAA >60 03/20/2018 0321   GFRAA >60 03/20/2018 0321   COAG Lab Results  Component Value Date   INR 0.99 01/09/2018   INR 0.9 09/30/2011     Disposition:  Discharge to :Home Discharge Instructions    Call MD for:  redness, tenderness, or signs of infection (pain, swelling, bleeding, redness, odor or green/yellow discharge around incision site)   Complete by:  As directed    Call MD for:  severe or increased pain, loss or decreased feeling  in affected limb(s)   Complete by:  As directed    Call MD for:  temperature >100.5   Complete by:  As directed    Resume previous diet   Complete by:  As directed      Allergies as of 03/20/2018   No Known Allergies     Medication List    STOP taking these medications   ticagrelor 90 MG Tabs tablet Commonly known as:  BRILINTA     TAKE these medications   aspirin 81 MG tablet Take 81 mg by mouth daily.   atorvastatin 80 MG tablet Commonly known as:  LIPITOR Take 1 tablet (80 mg total) by mouth daily at 6 PM.   carvedilol 3.125 MG  tablet Commonly known as:  COREG Take 1 tablet (3.125 mg total) by mouth 2 (two) times daily with a meal.   clopidogrel 75 MG tablet Commonly known as:  PLAVIX Take 1 tablet (75 mg total) by mouth daily.   losartan 25 MG tablet Commonly known as:  COZAAR Take 1 tablet (25 mg total) by mouth daily.   multivitamin per tablet Take 1 tablet by mouth daily.   nitroGLYCERIN 0.4 MG SL tablet Commonly known as:  NITROSTAT Place 1 tablet (0.4 mg total) under the tongue every 5 (five) minutes as needed. What changed:  reasons to take this   spironolactone 25 MG tablet Commonly known as:  ALDACTONE Take 0.5 tablets (12.5 mg total) by mouth daily.   vitamin B-12 1000 MCG tablet Commonly known as:  CYANOCOBALAMIN Take 1,000 mcg by mouth daily.      Verbal and written Discharge instructions given to the patient. Wound care per Discharge AVS Follow-up Information    Conrad [place], MD Follow up today.   Specialties:  Vascular Surgery, Cardiology Why:  has procedure plan in place f/u PRN Contact information: [place] [address] [phone]           Signed: Roxy Horseman 03/25/2018, 12:56 PM   Addendum  [name] is a 62 y.o. (1956-10-02) male is with large bilateral common iliac artery aneurysms and moderate abdominal aortic aneurysm.  The plan is staged bilateral internal iliac artery aneurysms followed by EVAR.  He presented this admission for embolization of left internal iliac artery.  This was completed with minimal coil embolization of the proximal internal iliac artery.  Of note, an aberrant lower pole left renal artery was found during the angiogram.  During his right internal iliac artery embolization, will interrogate this the left upper pole renal to see how much of the renal perfusion is provided by each.  Post-operatively, the patient had no obvious pelvic ischemia signs or symptoms overnight.  He will follow up in one month for the right  internal iliac artery embolization.   Adele Barthel, MD, FACS [facility] [place] Office: [phone] Pager: [phone]  03/30/2018, 8:16 AM

## 2018-03-30 ENCOUNTER — Other Ambulatory Visit: Payer: Self-pay | Admitting: *Deleted

## 2018-03-30 ENCOUNTER — Telehealth: Payer: Self-pay | Admitting: *Deleted

## 2018-03-30 NOTE — Telephone Encounter (Signed)
Phone call to patient's wife. Instructed to be at [facility] admitting department at 5:30 am on 4/25/for procedure. NPO past MN night prior except take Coreg, Losartan,Aldactome am of with sips of water. Hold all others until after procedure. Patient has remained on Plavix since 03/19/18 procedure and instructed to continue. Must have driver home and someone with him after d/c. Verbalized understanding.

## 2018-04-16 ENCOUNTER — Encounter (HOSPITAL_COMMUNITY)
Admission: RE | Disposition: A | Discharge status: Home / Self Care | Payer: Self-pay | Source: Ambulatory Visit | Attending: Vascular Surgery

## 2018-04-16 ENCOUNTER — Ambulatory Visit (HOSPITAL_COMMUNITY)
Admission: RE | Admit: 2018-04-16 | Discharge: 2018-04-16 | Disposition: A | Discharge status: Home / Self Care | Payer: BLUE CROSS/BLUE SHIELD | Source: Ambulatory Visit | Attending: Vascular Surgery | Admitting: Vascular Surgery

## 2018-04-16 ENCOUNTER — Encounter (HOSPITAL_COMMUNITY): Payer: Self-pay | Admitting: Vascular Surgery

## 2018-04-16 DIAGNOSIS — I2510 Atherosclerotic heart disease of native coronary artery without angina pectoris: Secondary | ICD-10-CM | POA: Diagnosis not present

## 2018-04-16 DIAGNOSIS — I723 Aneurysm of iliac artery: Secondary | ICD-10-CM | POA: Diagnosis not present

## 2018-04-16 DIAGNOSIS — Z955 Presence of coronary angioplasty implant and graft: Secondary | ICD-10-CM | POA: Diagnosis not present

## 2018-04-16 DIAGNOSIS — Z9889 Other specified postprocedural states: Secondary | ICD-10-CM | POA: Diagnosis not present

## 2018-04-16 DIAGNOSIS — Z823 Family history of stroke: Secondary | ICD-10-CM | POA: Insufficient documentation

## 2018-04-16 DIAGNOSIS — I714 Abdominal aortic aneurysm, without rupture: Secondary | ICD-10-CM | POA: Insufficient documentation

## 2018-04-16 DIAGNOSIS — E1151 Type 2 diabetes mellitus with diabetic peripheral angiopathy without gangrene: Secondary | ICD-10-CM | POA: Diagnosis not present

## 2018-04-16 DIAGNOSIS — M1990 Unspecified osteoarthritis, unspecified site: Secondary | ICD-10-CM | POA: Insufficient documentation

## 2018-04-16 DIAGNOSIS — I10 Essential (primary) hypertension: Secondary | ICD-10-CM | POA: Insufficient documentation

## 2018-04-16 DIAGNOSIS — G4730 Sleep apnea, unspecified: Secondary | ICD-10-CM | POA: Diagnosis not present

## 2018-04-16 DIAGNOSIS — I429 Cardiomyopathy, unspecified: Secondary | ICD-10-CM | POA: Insufficient documentation

## 2018-04-16 DIAGNOSIS — Z95810 Presence of automatic (implantable) cardiac defibrillator: Secondary | ICD-10-CM | POA: Insufficient documentation

## 2018-04-16 DIAGNOSIS — Z8249 Family history of ischemic heart disease and other diseases of the circulatory system: Secondary | ICD-10-CM | POA: Diagnosis not present

## 2018-04-16 DIAGNOSIS — Z87891 Personal history of nicotine dependence: Secondary | ICD-10-CM | POA: Insufficient documentation

## 2018-04-16 DIAGNOSIS — Z86010 Personal history of colonic polyps: Secondary | ICD-10-CM | POA: Diagnosis not present

## 2018-04-16 DIAGNOSIS — J449 Chronic obstructive pulmonary disease, unspecified: Secondary | ICD-10-CM | POA: Insufficient documentation

## 2018-04-16 HISTORY — PX: EMBOLIZATION: CATH118239

## 2018-04-16 HISTORY — PX: RENAL ANGIOGRAPHY: CATH118260

## 2018-04-16 LAB — POCT I-STAT, CHEM 8
BUN: 11 mg/dL (ref 6–20)
CALCIUM ION: 1.19 mmol/L (ref 1.15–1.40)
CHLORIDE: 101 mmol/L (ref 101–111)
Creatinine, Ser: 0.6 mg/dL — ABNORMAL LOW (ref 0.61–1.24)
Glucose, Bld: 195 mg/dL — ABNORMAL HIGH (ref 65–99)
HEMATOCRIT: 37 % — AB (ref 39.0–52.0)
Hemoglobin: 12.6 g/dL — ABNORMAL LOW (ref 13.0–17.0)
POTASSIUM: 4 mmol/L (ref 3.5–5.1)
SODIUM: 139 mmol/L (ref 135–145)
TCO2: 25 mmol/L (ref 22–32)

## 2018-04-16 LAB — GLUCOSE, CAPILLARY
GLUCOSE-CAPILLARY: 165 mg/dL — AB (ref 65–99)
Glucose-Capillary: 198 mg/dL — ABNORMAL HIGH (ref 65–99)

## 2018-04-16 SURGERY — EMBOLIZATION
Anesthesia: LOCAL | Laterality: Right

## 2018-04-16 MED ORDER — SODIUM CHLORIDE 0.9 % IV SOLN: 250.0000 mL | INTRAVENOUS | Status: DC | PRN

## 2018-04-16 MED ORDER — SODIUM CHLORIDE 0.9 % IV SOLN
INTRAVENOUS | Status: DC
  Administered 2018-04-16: 06:00:00 via INTRAVENOUS

## 2018-04-16 MED ORDER — MIDAZOLAM HCL 2 MG/2ML IJ SOLN
INTRAMUSCULAR | Status: AC
  Filled 2018-04-16: qty 2

## 2018-04-16 MED ORDER — MIDAZOLAM HCL 2 MG/2ML IJ SOLN
INTRAMUSCULAR | Status: DC | PRN
  Administered 2018-04-16: 1 mg via INTRAVENOUS

## 2018-04-16 MED ORDER — FENTANYL CITRATE (PF) 100 MCG/2ML IJ SOLN
INTRAMUSCULAR | Status: DC | PRN
  Administered 2018-04-16 (×2): 25 ug via INTRAVENOUS
  Administered 2018-04-16: 50 ug via INTRAVENOUS

## 2018-04-16 MED ORDER — SODIUM CHLORIDE 0.9% FLUSH: 3.0000 mL | Freq: Two times a day (BID) | INTRAVENOUS | Status: DC

## 2018-04-16 MED ORDER — SODIUM CHLORIDE 0.9% FLUSH: 3.0000 mL | INTRAVENOUS | Status: DC | PRN

## 2018-04-16 MED ORDER — HEPARIN (PORCINE) IN NACL 1000-0.9 UT/500ML-% IV SOLN
INTRAVENOUS | Status: AC
  Filled 2018-04-16: qty 1000

## 2018-04-16 MED ORDER — ONDANSETRON HCL 4 MG/2ML IJ SOLN: 4.0000 mg | Freq: Four times a day (QID) | INTRAMUSCULAR | Status: DC | PRN

## 2018-04-16 MED ORDER — ACETAMINOPHEN 325 MG PO TABS: 650.0000 mg | ORAL_TABLET | ORAL | Status: DC | PRN

## 2018-04-16 MED ORDER — HEPARIN (PORCINE) IN NACL 2-0.9 UNITS/ML
INTRAMUSCULAR | Status: AC | PRN
  Administered 2018-04-16: 1000 mL via INTRA_ARTERIAL

## 2018-04-16 MED ORDER — HYDRALAZINE HCL 20 MG/ML IJ SOLN: 5.0000 mg | INTRAMUSCULAR | Status: DC | PRN

## 2018-04-16 MED ORDER — FENTANYL CITRATE (PF) 100 MCG/2ML IJ SOLN
INTRAMUSCULAR | Status: AC
  Filled 2018-04-16: qty 2

## 2018-04-16 MED ORDER — LIDOCAINE HCL (PF) 1 % IJ SOLN
INTRAMUSCULAR | Status: DC | PRN
  Administered 2018-04-16: 15 mL

## 2018-04-16 MED ORDER — NYSTATIN 100000 UNIT/GM EX CREA: TOPICAL_CREAM | CUTANEOUS | 1 refills | Status: DC

## 2018-04-16 MED ORDER — OXYCODONE HCL 5 MG PO TABS: 5.0000 mg | ORAL_TABLET | ORAL | Status: DC | PRN

## 2018-04-16 MED ORDER — LABETALOL HCL 5 MG/ML IV SOLN: 10.0000 mg | INTRAVENOUS | Status: DC | PRN

## 2018-04-16 MED ORDER — IODIXANOL 320 MG/ML IV SOLN
INTRAVENOUS | Status: DC | PRN
  Administered 2018-04-16: 85 mL via INTRA_ARTERIAL

## 2018-04-16 MED ORDER — SODIUM CHLORIDE 0.9 % WEIGHT BASED INFUSION: 1.0000 mL/kg/h | INTRAVENOUS | Status: DC

## 2018-04-16 MED ORDER — LIDOCAINE HCL (PF) 1 % IJ SOLN
INTRAMUSCULAR | Status: AC
  Filled 2018-04-16: qty 30

## 2018-04-16 MED ORDER — MORPHINE SULFATE (PF) 10 MG/ML IV SOLN: 2.0000 mg | INTRAVENOUS | Status: DC | PRN

## 2018-04-16 SURGICAL SUPPLY — 16 items
CATH ANGIO 5F BER2 65CM (CATHETERS) ×3 IMPLANT
CATH BEACON 5 .035 65 VANSC3 (CATHETERS) ×3 IMPLANT
CATH STRAIGHT 5FR 65CM (CATHETERS) ×3 IMPLANT
CATH TEMPO 5F RIM 65CM (CATHETERS) ×3 IMPLANT
COIL NESTER 14X6 (Embolic) ×9 IMPLANT
COVER PRB 48X5XTLSCP FOLD TPE (BAG) ×4 IMPLANT
COVER PROBE 5X48 (BAG) ×2
DEVICE TORQUE .025-.038 (MISCELLANEOUS) ×3 IMPLANT
GUIDEWIRE ANGLED .035X150CM (WIRE) ×3 IMPLANT
KIT MICROPUNCTURE NIT STIFF (SHEATH) ×3 IMPLANT
KIT PV (KITS) ×3 IMPLANT
SHEATH AVANTI 11CM 5FR (SHEATH) ×6 IMPLANT
SYR MEDRAD MARK V 150ML (SYRINGE) ×3 IMPLANT
TRANSDUCER W/STOPCOCK (MISCELLANEOUS) ×3 IMPLANT
TRAY PV CATH (CUSTOM PROCEDURE TRAY) ×3 IMPLANT
WIRE BENTSON .035X145CM (WIRE) ×3 IMPLANT

## 2018-04-16 NOTE — Discharge Instructions (Signed)
**Note [name]-identified via Obfuscation** Femoral Site Care °Refer to this sheet in the next few weeks. These instructions provide you with information about caring for yourself after your procedure. Your health care provider may also give you more specific instructions. Your treatment has been planned according to current medical practices, but problems sometimes occur. Call your health care provider if you have any problems or questions after your procedure. °What can I expect after the procedure? °After your procedure, it is typical to have the following: °· Bruising at the site that usually fades within 1-2 weeks. °· Blood collecting in the tissue (hematoma) that may be painful to the touch. It should usually decrease in size and tenderness within 1-2 weeks. ° °Follow these instructions at home: °· Take medicines only as directed by your health care provider. °· You may shower 24-48 hours after the procedure or as directed by your health care provider. Remove the bandage (dressing) and gently wash the site with plain soap and water. Pat the area dry with a clean towel. Do not rub the site, because this may cause bleeding. °· Do not take baths, swim, or use a hot tub until your health care provider approves. °· Check your insertion site every day for redness, swelling, or drainage. °· Do not apply powder or lotion to the site. °· Limit use of stairs to twice a day for the first 2-3 days or as directed by your health care provider. °· Do not squat for the first 2-3 days or as directed by your health care provider. °· Do not lift over 10 lb (4.5 kg) for 5 days after your procedure or as directed by your health care provider. °· Ask your health care provider when it is okay to: °? Return to work or school. °? Resume usual physical activities or sports. °? Resume sexual activity. °· Do not drive home if you are discharged the same day as the procedure. Have someone else drive you. °· You may drive 24 hours after the procedure unless otherwise instructed by  your health care provider. °· Do not operate machinery or power tools for 24 hours after the procedure or as directed by your health care provider. °· If your procedure was done as an outpatient procedure, which means that you went home the same day as your procedure, a responsible adult should be with you for the first 24 hours after you arrive home. °· Keep all follow-up visits as directed by your health care provider. This is important. °Contact a health care provider if: °· You have a fever. °· You have chills. °· You have increased bleeding from the site. Hold pressure on the site. °Get help right away if: °· You have unusual pain at the site. °· You have redness, warmth, or swelling at the site. °· You have drainage (other than a small amount of blood on the dressing) from the site. °· The site is bleeding, and the bleeding does not stop after 30 minutes of holding steady pressure on the site. °· Your leg or foot becomes pale, cool, tingly, or numb. °This information is not intended to replace advice given to you by your health care provider. Make sure you discuss any questions you have with your health care provider. °Document Released: 08/12/2014 Document Revised: 05/16/2016 Document Reviewed: 06/28/2014 °Elsevier Interactive Patient Education © 2018 Elsevier Inc. ° °

## 2018-04-16 NOTE — Progress Notes (Signed)
Site area: rt groin fa sheath Site Prior to Removal:  Level 0 Pressure Applied For: 20 minutes Manual:   yes Patient Status During Pull:  stable Post Pull Site:  Level 0 Post Pull Instructions Given:  yes Post Pull Pulses Present: palpable Dressing Applied:  Gauze and tegaderm Bedrest begins @  7124 Comments:

## 2018-04-16 NOTE — H&P (Addendum)
Brief History and Physical  History of Present Illness   [name] is a 62 y.o. male who presents with chief complaint: AAA and B CIA aneurysms.  The patient presents today for R IIA embolization.  Planned staged B IIA embolization: L IIA completed last month without complications.  Today, R IIA embolization planned.  Finally, EVAR with B EIA extension planned in another 2-3 weeks..    Past Medical History:  Diagnosis Date  . Arthritis   . Automatic implantable cardioverter-defibrillator in situ   . Benign essential HTN 09/17/2016  . COPD (chronic obstructive pulmonary disease) ([place])    emphysema by CXR  . Coronary artery disease     LAD a 60-70% stenosis, moderate size second diagonal with 50% stenosis, circumflex AV groove 75-80% stenosis with a branching obtuse marginal with a superior branch subtotal stenosis followed by diffuse disease, right coronary artery  had nonobstructive disease.  EF was 35%  . Diabetes mellitus    type 2  . Diverticulosis of colon   . GERD (gastroesophageal reflux disease)    with esophagitis  . History of colonic polyps   . Hypertension   . Non-ischemic cardiomyopathy ([place])   . Presence of permanent cardiac pacemaker   . PVD (peripheral vascular disease) ([place])    bilateral common iliac artery aneurysms. Right SFA occlusion over a long segment. Left SFA disease with occlusion of the left TP trunk. Artirogram Oct. 2006  . Shortness of breath    exertion  . Sleep apnea   . Tobacco abuse   . Urinary incontinence    detrussor instability    Past Surgical History:  Procedure Laterality Date  . CARDIAC CATHETERIZATION  06/2004   negative  . COLONOSCOPY  04/2005  . COPD exacerbation    . CORONARY ANGIOPLASTY  01/09/2018  . CORONARY STENT INTERVENTION N/A 01/10/2018   Procedure: CORONARY STENT INTERVENTION;  Surgeon: Lorretta Harp, MD;  Location: [facility];  Service: Cardiovascular;  Laterality: N/A;  . CORONARY/GRAFT ACUTE MI  REVASCULARIZATION N/A 01/10/2018   Procedure: Coronary/Graft Acute MI Revascularization;  Surgeon: Lorretta Harp, MD;  Location: [facility];  Service: Cardiovascular;  Laterality: N/A;  . EMBOLIZATION Left 03/19/2018  . EMBOLIZATION Left 03/19/2018   Procedure: EMBOLIZATION - Left Internal;  Surgeon: Conrad Happy, MD;  Location: [facility];  Service: Cardiovascular;  Laterality: Left;  . LEFT HEART CATH AND CORONARY ANGIOGRAPHY N/A 01/10/2018   Procedure: LEFT HEART CATH AND CORONARY ANGIOGRAPHY;  Surgeon: Lorretta Harp, MD;  Location: [facility];  Service: Cardiovascular;  Laterality: N/A;  . PACEMAKER INSERTION  11/2005  . PACEMAKER LEAD REMOVAL N/A 10/17/2014   Procedure: PACEMAKER LEAD REMOVAL;  Surgeon: Evans Lance, MD;  [facility];  Service: Cardiovascular;  Laterality: N/A;  . s/p ICD placement      [employer] Maximo [phone] single chamber    Social History   Socioeconomic History  . Marital status: Married    Spouse name: Not on file  . Number of children: 2  . Years of education: Not on file  . Highest education level: Not on file  Occupational History  . Occupation: Grave digger--now disabled  Social Needs  . Financial resource strain: Not on file  . Food insecurity:    Worry: Not on file    Inability: Not on file  . Transportation needs:    Medical: Not on file    Non-medical: Not on  file  Tobacco Use  . Smoking status: Former Smoker    Packs/day: 2.00    Years: 15.00    Pack years: 30.00    Types: Cigarettes    Last attempt to quit: 01/09/2018    Years since quitting: 0.2  . Smokeless tobacco: Never Used  . Tobacco comment: GAVE 1-800-QUIT-NOW  Substance and Sexual Activity  . Alcohol use: No  . Drug use: No  . Sexual activity: Never  Lifestyle  . Physical activity:    Days per week: Not on file    Minutes per session: Not on file  . Stress: Not on file  Relationships  . Social connections:    Talks on phone: Not on  file    Gets together: Not on file    Attends religious service: Not on file    Active member of club or organization: Not on file    Attends meetings of clubs or organizations: Not on file    Relationship status: Not on file  . Intimate partner violence:    Fear of current or ex partner: Not on file    Emotionally abused: Not on file    Physically abused: Not on file    Forced sexual activity: Not on file  Other Topics Concern  . Not on file  Social History Narrative   No living will   Requests wife as health care POA   Would accept resuscitation but doesn't want prolonged ventilation   No tube feeds if cognitively unaware    Family History  Problem Relation Age of Onset  . Stroke Father   . Coronary artery disease Maternal Aunt   . Heart failure Maternal Aunt   . Lung cancer Maternal Aunt   . Lung cancer Maternal Uncle   . Cancer Brother        Mouth  . Cancer Sister        throat    Current Facility-Administered Medications  Medication Dose Route Frequency Provider Last Rate Last Dose  . 0.9 %  sodium chloride infusion   Intravenous Continuous Conrad [place], MD 100 mL/hr at 04/16/18 0602      No Known Allergies  Review of Systems: As listed above, otherwise negative.   Physical Examination   Vitals:   04/16/18 0538  BP: 125/74  Pulse: 75  Temp: 97.7 F (36.5 C)  TempSrc: Oral  SpO2: 97%  Weight: 270 lb (122.5 kg)  Height: 5\' 10"  (1.778 m)   Body mass index is 38.74 kg/m.  General Alert, O x 3, WD, NAD  Pulmonary Sym exp, good B air movt, CTA B  Cardiac RRR, Nl S1, S2, no Murmurs, No rubs, No S3,S4  Musculo- skeletal MS 5/5, no ischemic lesions  Neurologic Pain and light touch intact in extremities,     Laboratory  See [facility]   Medical Decision Making   DOY [name] is a 62 y.o. male who presents with: moderate sized AAA, large B CIA aneurysms.  On prior aortogram, an aberrant L lower pole renal artery was found.  Selection of the upper  pole renal artery and injection to determine which portion of the kidney is perfuse is planned for today's procedure. The patient is scheduled for: R IIA embolization, selection of L renal artery. I discussed with the patient the nature of angiographic procedures, especially the limited patencies of any endovascular intervention.  The patient is aware of that the risks of an angiographic procedure include but are not limited to: bleeding,  infection, access site complications, renal failure, embolization, rupture of vessel, dissection, possible need for emergent surgical intervention, possible need for surgical procedures to treat the patient's pathology, and stroke and death.    Pt is aware of the risks of pelvic ischemic especially given prior L IIA embolization.  The patient is aware of the risks and agrees to proceed.   Adele Barthel, MD, FACS [facility] [place] Office: [phone] Pager: [phone]  04/16/2018, 7:09 AM

## 2018-04-16 NOTE — Op Note (Signed)
OPERATIVE NOTE   PROCEDURE: 1.  Right common femoral artery cannulation under ultrasound guidance 2.  Right pelvic angiogram 3.  Selection of right internal iliac artery  4.  Embolization of right internal iliac artery x 3 (Nestor 6 mm x 3) 5.  Left renal artery selection 6.  Left renal angiogram 7.  Conscious sedation for 93 minutes  PRE-OPERATIVE DIAGNOSIS: moderate abdominal aortic aneurysm, bilateral large common iliac artery aneurysm  POST-OPERATIVE DIAGNOSIS: same as above   SURGEON: Adele Barthel, MD  ANESTHESIA: conscious sedation  ESTIMATED BLOOD LOSS: 50 cc  CONTRAST: 85 cc  FINDING(S):  Calcific stenosis at orifice of right internal iliac artery  Successful embolization of right internal iliac artery in mid-distal segment of main internal iliac artery  Left main renal artery perfuses 85% of left kidney  SPECIMEN(S):  none  INDICATIONS:   [name] is a 62 y.o. male who presents with moderate size abdominal aortic aneurysm and large bilateral common iliac artery aneurysms.  The patient presents for: right internal iliac artery embolization and left renal artery angiogram.  I discussed with the patient the nature of angiographic procedures, especially the limited patencies of any endovascular intervention.  The patient is aware of that the risks of an angiographic procedure include but are not limited to: bleeding, infection, access site complications, renal failure, embolization, rupture of vessel, dissection, possible need for emergent surgical intervention, possible need for surgical procedures to treat the patient's pathology, and stroke and death.  The patient is aware of the risks and agrees to proceed.  DESCRIPTION: After full informed consent was obtained from the patient, the patient was brought back to the angiography suite.  The patient was placed supine upon the angiography table and connected to cardiopulmonary monitoring equipment.  The patient was  then given conscious sedation, the amounts of which are documented in the patient's chart.  A circulating radiologic technician maintained continuous monitoring of the patient's cardiopulmonary status.  Additionally, the control room radiologic technician provided backup monitoring throughout the procedure.  The patient was prepped and drape in the standard fashion for an angiographic procedure.  At this point, attention was turned to the right groin.  Under ultrasound guidance,  The subcutaneous tissue surrounding the right common femoral artery was anesthesized with 1% lidocaine with epinephrine.  The artery was then cannulated with a 18 gauge needle.  The wire only advanced a short distance into the artery due to significant calcification.  I elected to pull the wire and needle and hold pressure for 5 minutes.    Under ultrasound guidance, the subcutaneous tissue surrounding the right common femoral artery was anesthesized with 1% lidocaine with epinephrine.  The artery was then cannulated with a micropuncture needle.  The microwire was advanced into the iliac arterial system.  The needle was exchanged for a microsheath, which was loaded into the common femoral artery over the wire.  The microwire was exchanged for a Bentson wire which was advanced into the aorta.  The microsheath was then exchanged for a 5-Fr sheath which was loaded into the common femoral artery.  The right common iliac artery was calcified, so I could easily determine the level of the internal iliac artery take off.  Multiple angled injections were obtain before clearly identifying the take off the internal iliac artery and rotating the common iliac artery aneurysm off the internal iliac artery.  I selected the right internal iliac artery with a Vanchies-3 catheter and a Glidewire.  I advanced the  wire into the third order branches of the internal iliac artery and then advanced the catheter into the end of the main internal iliac artery.   I exchanged the catheter for a straight catheter.  I delivered three 6 mm Nestor coils into the mid-distal segment of the main internal iliac artery.  After waiting 3 minutes, no further retrograde flow from the distal internal iliac artery was visualized.  I removed the catheter.  A BER-2 catheter and Bentson wire was used to get into the suprarenal aorta.  I then did a hand injection of the aorta to identify the level of the main left renal artery.  I selected the main left renal artery using this combination.  I did a left renal artery angiogram which demonstrates the main left renal artery perfuses >85% of the left kidney.  I disengaged this catheter and removed it.  The sheath was aspirated.  No clots were present and the sheath was reloaded with heparinized saline.     COMPLICATIONS: none  CONDITION: stable   Adele Barthel, MD, [facility] [place] Office: [phone] Pager: [phone]  04/16/2018, 9:20 AM

## 2018-04-16 NOTE — H&P (View-Only) (Signed)
Brief History and Physical  History of Present Illness   [name] is a 62 y.o. male who presents with chief complaint: AAA and B CIA aneurysms.  The patient presents today for R IIA embolization.  Planned staged B IIA embolization: L IIA completed last month without complications.  Today, R IIA embolization planned.  Finally, EVAR with B EIA extension planned in another 2-3 weeks..    Past Medical History:  Diagnosis Date  . Arthritis   . Automatic implantable cardioverter-defibrillator in situ   . Benign essential HTN 09/17/2016  . COPD (chronic obstructive pulmonary disease) ([place])    emphysema by CXR  . Coronary artery disease     LAD a 60-70% stenosis, moderate size second diagonal with 50% stenosis, circumflex AV groove 75-80% stenosis with a branching obtuse marginal with a superior branch subtotal stenosis followed by diffuse disease, right coronary artery  had nonobstructive disease.  EF was 35%  . Diabetes mellitus    type 2  . Diverticulosis of colon   . GERD (gastroesophageal reflux disease)    with esophagitis  . History of colonic polyps   . Hypertension   . Non-ischemic cardiomyopathy ([place])   . Presence of permanent cardiac pacemaker   . PVD (peripheral vascular disease) ([place])    bilateral common iliac artery aneurysms. Right SFA occlusion over a long segment. Left SFA disease with occlusion of the left TP trunk. Artirogram Oct. 2006  . Shortness of breath    exertion  . Sleep apnea   . Tobacco abuse   . Urinary incontinence    detrussor instability    Past Surgical History:  Procedure Laterality Date  . CARDIAC CATHETERIZATION  06/2004   negative  . COLONOSCOPY  04/2005  . COPD exacerbation    . CORONARY ANGIOPLASTY  01/09/2018  . CORONARY STENT INTERVENTION N/A 01/10/2018   Procedure: CORONARY STENT INTERVENTION;  Surgeon: Lorretta Harp, MD;  Location: [place] [facility];  Service: Cardiovascular;  Laterality: N/A;  . CORONARY/GRAFT ACUTE MI  REVASCULARIZATION N/A 01/10/2018   Procedure: Coronary/Graft Acute MI Revascularization;  Surgeon: Lorretta Harp, MD;  Location: [facility];  Service: Cardiovascular;  Laterality: N/A;  . EMBOLIZATION Left 03/19/2018  . EMBOLIZATION Left 03/19/2018   Procedure: EMBOLIZATION - Left Internal;  Surgeon: Conrad Amsterdam, MD;  Location: [place];  Service: Cardiovascular;  Laterality: Left;  . LEFT HEART CATH AND CORONARY ANGIOGRAPHY N/A 01/10/2018   Procedure: LEFT HEART CATH AND CORONARY ANGIOGRAPHY;  Surgeon: Lorretta Harp, MD;  Location: [place] [facility];  Service: Cardiovascular;  Laterality: N/A;  . PACEMAKER INSERTION  11/2005  . PACEMAKER LEAD REMOVAL N/A 10/17/2014   Procedure: PACEMAKER LEAD REMOVAL;  Surgeon: Evans Lance, MD;  Location: [facility] OR;  Service: Cardiovascular;  Laterality: N/A;  . s/p ICD placement      Medtronic Maximo [phone] single chamber    Social History   Socioeconomic History  . Marital status: Married    Spouse name: Not on file  . Number of children: 2  . Years of education: Not on file  . Highest education level: Not on file  Occupational History  . Occupation: Grave digger--now disabled  Social Needs  . Financial resource strain: Not on file  . Food insecurity:    Worry: Not on file    Inability: Not on file  . Transportation needs:    Medical: Not on file    Non-medical: Not on  file  Tobacco Use  . Smoking status: Former Smoker    Packs/day: 2.00    Years: 15.00    Pack years: 30.00    Types: Cigarettes    Last attempt to quit: 01/09/2018    Years since quitting: 0.2  . Smokeless tobacco: Never Used  . Tobacco comment: GAVE 1-800-QUIT-NOW  Substance and Sexual Activity  . Alcohol use: No  . Drug use: No  . Sexual activity: Never  Lifestyle  . Physical activity:    Days per week: Not on file    Minutes per session: Not on file  . Stress: Not on file  Relationships  . Social connections:    Talks on phone: Not on  file    Gets together: Not on file    Attends religious service: Not on file    Active member of club or organization: Not on file    Attends meetings of clubs or organizations: Not on file    Relationship status: Not on file  . Intimate partner violence:    Fear of current or ex partner: Not on file    Emotionally abused: Not on file    Physically abused: Not on file    Forced sexual activity: Not on file  Other Topics Concern  . Not on file  Social History Narrative   No living will   Requests wife as health care POA   Would accept resuscitation but doesn't want prolonged ventilation   No tube feeds if cognitively unaware    Family History  Problem Relation Age of Onset  . Stroke Father   . Coronary artery disease Maternal Aunt   . Heart failure Maternal Aunt   . Lung cancer Maternal Aunt   . Lung cancer Maternal Uncle   . Cancer Brother        Mouth  . Cancer Sister        throat    Current Facility-Administered Medications  Medication Dose Route Frequency Provider Last Rate Last Dose  . 0.9 %  sodium chloride infusion   Intravenous Continuous Conrad Hempstead, MD 100 mL/hr at 04/16/18 0602      No Known Allergies  Review of Systems: As listed above, otherwise negative.   Physical Examination   Vitals:   04/16/18 0538  BP: 125/74  Pulse: 75  Temp: 97.7 F (36.5 C)  TempSrc: Oral  SpO2: 97%  Weight: 270 lb (122.5 kg)  Height: 5\' 10"  (1.778 m)   Body mass index is 38.74 kg/m.  General Alert, O x 3, WD, NAD  Pulmonary Sym exp, good B air movt, CTA B  Cardiac RRR, Nl S1, S2, no Murmurs, No rubs, No S3,S4  Musculo- skeletal MS 5/5, no ischemic lesions  Neurologic Pain and light touch intact in extremities,     Laboratory  See [facility]   Medical Decision Making   [name] is a 62 y.o. male who presents with: moderate sized AAA, large B CIA aneurysms.  On prior aortogram, an aberrant L lower pole renal artery was found.  Selection of the upper  pole renal artery and injection to determine which portion of the kidney is perfuse is planned for today's procedure. The patient is scheduled for: R IIA embolization, selection of L renal artery. I discussed with the patient the nature of angiographic procedures, especially the limited patencies of any endovascular intervention.  The patient is aware of that the risks of an angiographic procedure include but are not limited to: bleeding,  infection, access site complications, renal failure, embolization, rupture of vessel, dissection, possible need for emergent surgical intervention, possible need for surgical procedures to treat the patient's pathology, and stroke and death.    Pt is aware of the risks of pelvic ischemic especially given prior L IIA embolization.  The patient is aware of the risks and agrees to proceed.   Adele Barthel, MD, FACS [facility] [place] Office: [phone] Pager: [phone]  04/16/2018, 7:09 AM

## 2018-04-17 MED FILL — Heparin Sod (Porcine)-NaCl IV Soln 1000 Unit/500ML-0.9%: INTRAVENOUS | Qty: 1000 | Status: AC

## 2018-04-20 ENCOUNTER — Other Ambulatory Visit: Payer: Self-pay | Admitting: *Deleted

## 2018-04-21 ENCOUNTER — Telehealth: Payer: Self-pay | Admitting: *Deleted

## 2018-04-21 NOTE — Telephone Encounter (Signed)
Call to wife instructions given. To be at [facility] admitting department at 5:30 am on 05/11/18 for EVAR. No food or drink past MN night prior. To stay on ASA and PLAVIX per Dr. Bridgett Larsson. Expect a call and follow the detailed instructions received from the hospital pre-admission department about this surgery. Verbalized understanding of all instructions.

## 2018-04-27 ENCOUNTER — Telehealth: Payer: Self-pay | Admitting: *Deleted

## 2018-04-27 NOTE — Telephone Encounter (Signed)
Surgery re-scheduled for Dr. Bridgett Larsson. Patient's wife called to be at [facility] admitting office at 10 am on 05/07/18 for EVAR. Time confirmed with Cammy Brochure . All other pre-op instructions unchanged. To follow the detailed instructions received from the pre-admission department.

## 2018-04-29 NOTE — Pre-Procedure Instructions (Signed)
JOLAN UPCHURCH  04/29/2018      [facility] - [place], [place] - [place] [address], [address] [place] [address] Phone: [phone] Fax: [phone]  [id number] - [place], [place] [employer] [place] [address] Phone: [phone] Fax: [phone]    Your procedure is scheduled on 05/07/2018.  Report to [facility] Admitting at 1030 A.M.  Call this number if you have problems the morning of surgery:  [phone]   Remember:  Do not eat food or drink liquids after midnight.   Continue all medications as directed by your physician except follow these medication instructions before surgery below   Take these medicines the morning of surgery with A SIP OF WATER: Carvedilol (Coreg) Nitroglycerin (Nitrostat) - if needed  7 days prior to surgery STOP taking any Aleve, Naproxen, Ibuprofen, Motrin, Advil, Goody's, BC's, all herbal medications, fish oil, and all vitamins  Follow your doctors instructions regarding your Aspirin and Plavix.  If no instructions were given by your doctor, then you will need to call your surgeon's office to get instructions.     How to Manage Your Diabetes Before and After Surgery  Why is it important to control my blood sugar before and after surgery? . Improving blood sugar levels before and after surgery helps healing and can limit problems. . A way of improving blood sugar control is eating a healthy diet by: o  Eating less sugar and carbohydrates o  Increasing activity/exercise o  Talking with your doctor about reaching your blood sugar goals . High blood sugars (greater than 180 mg/dL) can raise your risk of infections and slow your recovery, so you will need to focus on controlling your diabetes during the weeks before surgery. . Make sure that the doctor who takes care of your diabetes knows about your planned surgery including the date and location.  How do I  manage my blood sugar before surgery? . Check your blood sugar at least 4 times a day, starting 2 days before surgery, to make sure that the level is not too high or low. o Check your blood sugar the morning of your surgery when you wake up and every 2 hours until you get to the Short Stay unit. . If your blood sugar is less than 70 mg/dL, you will need to treat for low blood sugar: o Do not take insulin. o Treat a low blood sugar (less than 70 mg/dL) with  cup of clear juice (cranberry or apple), 4 glucose tablets, OR glucose gel. Recheck blood sugar in 15 minutes after treatment (to make sure it is greater than 70 mg/dL). If your blood sugar is not greater than 70 mg/dL on recheck, call [phone] o  for further instructions. . Report your blood sugar to the short stay nurse when you get to Short Stay.  . If you are admitted to the hospital after surgery: o Your blood sugar will be checked by the staff and you will probably be given insulin after surgery (instead of oral diabetes medicines) to make sure you have good blood sugar levels. o The goal for blood sugar control after surgery is 80-180 mg/dL.   WHAT DO I DO ABOUT MY DIABETES MEDICATION?  Marland Kitchen Do not take oral diabetes medicines (pills) the morning of surgery.  . THE NIGHT BEFORE SURGERY, take ___________ units of ___________insulin.    . THE MORNING OF SURGERY, take _____________ units of  __________insulin.    Do not wear jewelry.  Do not wear lotions, powders, or colognes, or deodorant.  Men may shave face and neck.  Do not bring valuables to the hospital.  [facility] is not responsible for any belongings or valuables.  Hearing aids, eyeglasses, contacts, dentures or bridgework may not be worn into surgery.  Leave your suitcase in the car.  After surgery it may be brought to your room.  For patients admitted to the hospital, discharge time will be determined by your treatment team.  Patients discharged the day of  surgery will not be allowed to drive home.   Name and phone number of your driver:    Special instructions:   [place]- Preparing For Surgery  Before surgery, you can play an important role. Because skin is not sterile, your skin needs to be as free of germs as possible. You can reduce the number of germs on your skin by washing with CHG (chlorahexidine gluconate) Soap before surgery.  CHG is an antiseptic cleaner which kills germs and bonds with the skin to continue killing germs even after washing.  Please do not use if you have an allergy to CHG or antibacterial soaps. If your skin becomes reddened/irritated stop using the CHG.  Do not shave (including legs and underarms) for at least 48 hours prior to first CHG shower. It is OK to shave your face.  Please follow these instructions carefully.   1. Shower the NIGHT BEFORE SURGERY and the MORNING OF SURGERY with CHG.   2. If you chose to wash your hair, wash your hair first as usual with your normal shampoo.  3. After you shampoo, rinse your hair and body thoroughly to remove the shampoo.  4. Use CHG as you would any other liquid soap. You can apply CHG directly to the skin and wash gently with a scrungie or a clean washcloth.   5. Apply the CHG Soap to your body ONLY FROM THE NECK DOWN.  Do not use on open wounds or open sores. Avoid contact with your eyes, ears, mouth and genitals (private parts). Wash Face and genitals (private parts)  with your normal soap.  6. Wash thoroughly, paying special attention to the area where your surgery will be performed.  7. Thoroughly rinse your body with warm water from the neck down.  8. DO NOT shower/wash with your normal soap after using and rinsing off the CHG Soap.  9. Pat yourself dry with a CLEAN TOWEL.  10. Wear CLEAN PAJAMAS to bed the night before surgery, wear comfortable clothes the morning of surgery  11. Place CLEAN SHEETS on your bed the night of your first shower and DO NOT  SLEEP WITH PETS.    Day of Surgery: Shower as stated above. Do not apply any deodorants/lotions.  Please wear clean clothes to the hospital/surgery center.      Please read over the following fact sheets that you were given.

## 2018-04-30 ENCOUNTER — Encounter (HOSPITAL_COMMUNITY)
Admission: RE | Admit: 2018-04-30 | Discharge: 2018-04-30 | Disposition: A | Discharge status: Home / Self Care | Payer: BLUE CROSS/BLUE SHIELD | Source: Ambulatory Visit | Attending: Vascular Surgery | Admitting: Vascular Surgery

## 2018-04-30 ENCOUNTER — Encounter (HOSPITAL_COMMUNITY): Payer: Self-pay

## 2018-04-30 DIAGNOSIS — Z01812 Encounter for preprocedural laboratory examination: Secondary | ICD-10-CM | POA: Insufficient documentation

## 2018-04-30 DIAGNOSIS — I714 Abdominal aortic aneurysm, without rupture: Secondary | ICD-10-CM | POA: Insufficient documentation

## 2018-04-30 LAB — COMPREHENSIVE METABOLIC PANEL
ALT: 30 U/L (ref 17–63)
ANION GAP: 13 (ref 5–15)
AST: 28 U/L (ref 15–41)
Albumin: 3.7 g/dL (ref 3.5–5.0)
Alkaline Phosphatase: 101 U/L (ref 38–126)
BILIRUBIN TOTAL: 0.8 mg/dL (ref 0.3–1.2)
BUN: 11 mg/dL (ref 6–20)
CO2: 21 mmol/L — ABNORMAL LOW (ref 22–32)
Calcium: 8.8 mg/dL — ABNORMAL LOW (ref 8.9–10.3)
Chloride: 101 mmol/L (ref 101–111)
Creatinine, Ser: 0.83 mg/dL (ref 0.61–1.24)
Glucose, Bld: 357 mg/dL — ABNORMAL HIGH (ref 65–99)
POTASSIUM: 4.3 mmol/L (ref 3.5–5.1)
Sodium: 135 mmol/L (ref 135–145)
TOTAL PROTEIN: 6.9 g/dL (ref 6.5–8.1)

## 2018-04-30 LAB — SURGICAL PCR SCREEN
MRSA, PCR: NEGATIVE
Staphylococcus aureus: NEGATIVE

## 2018-04-30 LAB — CBC
HCT: 39.4 % (ref 39.0–52.0)
Hemoglobin: 13.4 g/dL (ref 13.0–17.0)
MCH: 28.5 pg (ref 26.0–34.0)
MCHC: 34 g/dL (ref 30.0–36.0)
MCV: 83.7 fL (ref 78.0–100.0)
Platelets: 267 K/uL (ref 150–400)
RBC: 4.71 MIL/uL (ref 4.22–5.81)
RDW: 13.3 % (ref 11.5–15.5)
WBC: 6.8 K/uL (ref 4.0–10.5)

## 2018-04-30 LAB — PROTIME-INR
INR: 0.98
PROTHROMBIN TIME: 12.9 s (ref 11.4–15.2)

## 2018-04-30 LAB — URINALYSIS, ROUTINE W REFLEX MICROSCOPIC
Bilirubin Urine: NEGATIVE
Glucose, UA: >=500 mg/dL — AB
Hgb urine dipstick: NEGATIVE
Ketones, ur: NEGATIVE mg/dL
Nitrite: NEGATIVE
Protein, ur: NEGATIVE mg/dL
Specific Gravity, Urine: 1.026 (ref 1.005–1.030)
WBC, UA: >50 WBC/hpf — ABNORMAL HIGH (ref 0–5)
pH: 5 (ref 5.0–8.0)

## 2018-04-30 LAB — APTT: aPTT: 32 seconds (ref 24–36)

## 2018-04-30 LAB — HEMOGLOBIN A1C
Hgb A1c MFr Bld: 7.3 % — ABNORMAL HIGH (ref 4.8–5.6)
Mean Plasma Glucose: 162.81 mg/dL

## 2018-04-30 LAB — GLUCOSE, CAPILLARY: Glucose-Capillary: 299 mg/dL — ABNORMAL HIGH (ref 65–99)

## 2018-04-30 MED ORDER — CHLORHEXIDINE GLUCONATE 4 % EX LIQD: 60.0000 mL | Freq: Once | CUTANEOUS | Status: DC

## 2018-04-30 NOTE — Final Progress Note (Signed)
Pt is to speak with Dr Bridgett Larsson today re. Asa,plavix

## 2018-05-01 ENCOUNTER — Other Ambulatory Visit: Payer: Self-pay | Admitting: *Deleted

## 2018-05-01 ENCOUNTER — Telehealth: Payer: Self-pay | Admitting: *Deleted

## 2018-05-01 DIAGNOSIS — E1151 Type 2 diabetes mellitus with diabetic peripheral angiopathy without gangrene: Secondary | ICD-10-CM

## 2018-05-01 MED ORDER — CIPROFLOXACIN HCL 500 MG PO TABS: 500.0000 mg | ORAL_TABLET | Freq: Two times a day (BID) | ORAL | 0 refills | Status: DC

## 2018-05-01 NOTE — Telephone Encounter (Signed)
Call to patient and spoke with wife. Instructed to start Cipro Today and drink plenty of water and watch carbohydrates. Verbalized understanding.

## 2018-05-01 NOTE — Progress Notes (Signed)
Anesthesia Chart Review:   Case:  [id number] Date/Time:  05/07/18 1217   Procedure:  ABDOMINAL AORTIC ENDOVASCULAR STENT GRAFT (N/A )   Anesthesia type:  Choice   Pre-op diagnosis:  ABDOMINAL AORTIC ANEURYSM   Location:  [facility] OR ROOM 16 / [place] OR   Surgeon:  Conrad Ivy, MD      DISCUSSION:  - Pt is a 62 year old male with hx CAD (s/p DES to RCA and CX 01/10/18), cardiomyopathy (EF 40-45%), DM (diet controlled) with glucose 357 and A1c 7.3 at pre-admission testing.  - To stay on ASA and plavix perioperatively - Cardiologist Dr. Percival Spanish referred pt for surgery - Previously had ICD for cardiomyopathy.  ICD had reached end of life in 2015; device was removed and pt elected to not have it replaced.    VS: BP 119/74   Pulse 86   Temp 36.8 C   Resp 18   Ht 5\' 10"  (1.778 m)   Wt 277 lb (125.6 kg)   SpO2 96%   BMI 39.75 kg/m    PROVIDERS: PCP is Venia Carbon, MD Cardiologist is Minus Breeding, MD who referred pt for surgery. Last office visit 01/21/18   LABS:  - HbA1c 7.3, glucose 357. Pt has diet controlled DM.   - UA with large amount WBCs, few bacteria  - Dr. Bridgett Larsson has reviewed labs.  - I routed UA, A1c and CMP results to [facility] in Dr. Lianne Moris office - I also routed A1c and CMP to PCP for f/u.   (all labs ordered are listed, but only abnormal results are displayed)  Labs Reviewed  GLUCOSE, CAPILLARY - Abnormal; Notable for the following components:      Result Value   Glucose-Capillary 299 (*)    All other components within normal limits  COMPREHENSIVE METABOLIC PANEL - Abnormal; Notable for the following components:   CO2 21 (*)    Glucose, Bld 357 (*)    Calcium 8.8 (*)    All other components within normal limits  URINALYSIS, ROUTINE W REFLEX MICROSCOPIC - Abnormal; Notable for the following components:   APPearance HAZY (*)    Glucose, UA >=500 (*)    Leukocytes, UA MODERATE (*)    WBC, UA >50 (*)    Bacteria, UA FEW (*)    All other components within normal  limits  HEMOGLOBIN A1C - Abnormal; Notable for the following components:   Hgb A1c MFr Bld 7.3 (*)    All other components within normal limits  SURGICAL PCR SCREEN  APTT  CBC  PROTIME-INR  TYPE AND SCREEN     IMAGES:  CT angio abdomen pelvis 02/03/18:  - VASCULAR 1. Abdominal aortic aneurysm measures 4.2 cm in diameter.  2. Right common iliac artery aneurysm measures 6.7 cm compared with 3.4 cm on the prior study. 3. Left common iliac artery aneurysm measures 5.8 cm compared with 2.8 cm on the prior study. 4. Significant narrowing at the origin of the left external iliac artery. 5. Bilateral superficial femoral artery occlusion. - NON-VASCULAR 1. No acute intra-abdominal pathology. 2. Lumbar spinal stenosis.   1 view CXR 01/09/18: Mild cardiomegaly and mild interstitial edema.   EKG 01/10/18: Sinus rhythm with 1st degree A-V block with occasional Premature ventricular complexes. Nonspecific ST and T wave abnormality   CV:  Nuclear stress test 01/21/18:   Nuclear stress EF: 35%.  There was no ST segment deviation noted during stress.  Defect 1: There is a large defect of moderate severity  present in the basal inferior, basal inferolateral, mid inferior, mid inferolateral and apical inferior location.  Findings consistent with prior myocardial infarction with a small amount of peri-infarct ischemia.  This is a high risk study due to reduced systolic function.  The left ventricular ejection fraction is moderately decreased (30-44%).  There was frequent ventricular ectopy which can cause inaccurate calculation of ejection fraction.  Echo  01/11/18:  - Left ventricle: The cavity size was normal. There was severe concentric hypertrophy. Systolic function was mildly to moderately reduced. The estimated ejection fraction was in the range of 40% to 45%. Diffuse hypokinesis. Doppler parameters are consistent with abnormal left ventricular relaxation (grade 1 diastolic  dysfunction). LV filling pressure appears elevated. Ejection fraction (MOD, 2-plane): 41%. - Aortic valve: Sclerosis without stenosis. There was no regurgitation. - Mitral valve: Mildly thickened leaflets . There was trivial regurgitation. - Left atrium: The atrium was normal in size. - Inferior vena cava: The vessel was normal in size. The respirophasic diameter changes were in the normal range (>= 50%), consistent with normal central venous pressure. - Impressions: Compared to a prior study in 04/2017, the LVEF has reduced from 50-55% down to 40-45% with global hypokinesis.  Cardiac cath 01/10/18:   Mid RCA lesion is 100% stenosed.  A stent was successfully placed.  Post intervention, there is a 0% residual stenosis.  Prox Cx to Mid Cx lesion is 100% stenosed.  Ost LAD to Prox LAD lesion is 50% stenosed.  A stent was successfully placed in CX.  Post intervention, there is a 0% residual stenosis.  There is severe left ventricular systolic dysfunction.  LV end diastolic pressure is moderately elevated.  The left ventricular ejection fraction is less than 25% by visual estimate.  Carotid duplex 05/14/17:  - Heterogeneous plaque, bilaterally. - Stable, 1-39% RICA stenosis. - Stable, 07-37% LICA stenosis.  - >50% LECA stenosis - Right vertebral artery not seen. - Left vertebral artery was patent with antegrade flow. - Normal subclavian arteries, bilaterally.   Past Medical History:  Diagnosis Date  . Arthritis   . Automatic implantable cardioverter-defibrillator in situ   . Benign essential HTN 09/17/2016  . COPD (chronic obstructive pulmonary disease) ([place])    emphysema by CXR  . Coronary artery disease     LAD a 60-70% stenosis, moderate size second diagonal with 50% stenosis, circumflex AV groove 75-80% stenosis with a branching obtuse marginal with a superior branch subtotal stenosis followed by diffuse disease, right coronary artery  had nonobstructive disease.  EF was  35%  . Diabetes mellitus    type 2  no meds  . Diverticulosis of colon   . GERD (gastroesophageal reflux disease)    with esophagitis  . History of colonic polyps   . Hypertension   . Non-ischemic cardiomyopathy ([place])   . Presence of permanent cardiac pacemaker   . PVD (peripheral vascular disease) ([place])    bilateral common iliac artery aneurysms. Right SFA occlusion over a long segment. Left SFA disease with occlusion of the left TP trunk. Artirogram Oct. 2006  . Shortness of breath    exertion  . Sleep apnea   . Tobacco abuse   . Urinary incontinence    detrussor instability    Past Surgical History:  Procedure Laterality Date  . CARDIAC CATHETERIZATION  06/2004   negative  . COLONOSCOPY  04/2005  . COPD exacerbation    . CORONARY ANGIOPLASTY  01/09/2018  . CORONARY STENT INTERVENTION N/A 01/10/2018   Procedure: CORONARY STENT  INTERVENTION;  Surgeon: Lorretta Harp, MD;  Location: [place] [facility];  Service: Cardiovascular;  Laterality: N/A;  . CORONARY/GRAFT ACUTE MI REVASCULARIZATION N/A 01/10/2018   Procedure: Coronary/Graft Acute MI Revascularization;  Surgeon: Lorretta Harp, MD;  Location: [place] [facility];  Service: Cardiovascular;  Laterality: N/A;  . EMBOLIZATION Left 03/19/2018  . EMBOLIZATION Left 03/19/2018   Procedure: EMBOLIZATION - Left Internal;  Surgeon: Conrad [place], MD;  Location: [place] [facility];  Service: Cardiovascular;  Laterality: Left;  . EMBOLIZATION Right 04/16/2018   Procedure: EMBOLIZATION;  Surgeon: Conrad [place], MD;  Location: [place] [facility];  Service: Cardiovascular;  Laterality: Right;  . LEFT HEART CATH AND CORONARY ANGIOGRAPHY N/A 01/10/2018   Procedure: LEFT HEART CATH AND CORONARY ANGIOGRAPHY;  Surgeon: Lorretta Harp, MD;  Location: [place] [facility];  Service: Cardiovascular;  Laterality: N/A;  . PACEMAKER INSERTION  11/2005  . PACEMAKER LEAD REMOVAL N/A 10/17/2014   Procedure: PACEMAKER LEAD REMOVAL;  Surgeon: Evans Lance, MD;  Location: [place];  Service: Cardiovascular;  Laterality: N/A;  . RENAL ANGIOGRAPHY Left 04/16/2018   Procedure: RENAL ANGIOGRAPHY;  Surgeon: Conrad Raiford, MD;  Location: [place] [facility];  Service: Cardiovascular;  Laterality: Left;  . s/p ICD placement      [employer] Maximo [phone] single chamber    MEDICATIONS: . aspirin 81 MG tablet  . atorvastatin (LIPITOR) 80 MG tablet  . carvedilol (COREG) 3.125 MG tablet  . clopidogrel (PLAVIX) 75 MG tablet  . losartan (COZAAR) 25 MG tablet  . multivitamin (THERAGRAN) per tablet  . nitroGLYCERIN (NITROSTAT) 0.4 MG SL tablet  . spironolactone (ALDACTONE) 25 MG tablet  . vitamin B-12 (CYANOCOBALAMIN) 1000 MCG tablet   No current facility-administered medications for this encounter.     If glucose acceptable day of surgery, I anticipate pt can proceed with surgery as scheduled.  Willeen Cass, FNP-BC [facility]/Anesthesiology Phone: [phone] 05/01/2018 11:04 AM

## 2018-05-05 ENCOUNTER — Other Ambulatory Visit (HOSPITAL_COMMUNITY): Payer: BLUE CROSS/BLUE SHIELD

## 2018-05-06 MED ORDER — DEXTROSE 5 % IV SOLN
3.0000 g | INTRAVENOUS | Status: AC
  Administered 2018-05-07: 3 g via INTRAVENOUS
  Filled 2018-05-06: qty 3

## 2018-05-06 MED ORDER — SODIUM CHLORIDE 0.9 % IV SOLN: INTRAVENOUS | Status: DC

## 2018-05-07 ENCOUNTER — Encounter (HOSPITAL_COMMUNITY)
Admission: RE | Disposition: A | Discharge status: Home / Self Care | Payer: Self-pay | Source: Ambulatory Visit | Attending: Vascular Surgery

## 2018-05-07 ENCOUNTER — Inpatient Hospital Stay (HOSPITAL_COMMUNITY)
Admission: RE | Admit: 2018-05-07 | Discharge: 2018-05-10 | DRG: 269 | Disposition: A | Discharge status: Home / Self Care | Payer: BLUE CROSS/BLUE SHIELD | Source: Ambulatory Visit | Attending: Vascular Surgery | Admitting: Vascular Surgery

## 2018-05-07 ENCOUNTER — Inpatient Hospital Stay (HOSPITAL_COMMUNITY): Payer: BLUE CROSS/BLUE SHIELD | Admitting: Anesthesiology

## 2018-05-07 ENCOUNTER — Encounter (HOSPITAL_COMMUNITY): Payer: Self-pay | Admitting: *Deleted

## 2018-05-07 ENCOUNTER — Inpatient Hospital Stay (HOSPITAL_COMMUNITY): Payer: BLUE CROSS/BLUE SHIELD | Admitting: Emergency Medicine

## 2018-05-07 DIAGNOSIS — I10 Essential (primary) hypertension: Secondary | ICD-10-CM | POA: Diagnosis not present

## 2018-05-07 DIAGNOSIS — I708 Atherosclerosis of other arteries: Secondary | ICD-10-CM | POA: Diagnosis present

## 2018-05-07 DIAGNOSIS — I2510 Atherosclerotic heart disease of native coronary artery without angina pectoris: Secondary | ICD-10-CM | POA: Diagnosis not present

## 2018-05-07 DIAGNOSIS — Z823 Family history of stroke: Secondary | ICD-10-CM | POA: Diagnosis not present

## 2018-05-07 DIAGNOSIS — K219 Gastro-esophageal reflux disease without esophagitis: Secondary | ICD-10-CM | POA: Diagnosis not present

## 2018-05-07 DIAGNOSIS — Z95810 Presence of automatic (implantable) cardiac defibrillator: Secondary | ICD-10-CM

## 2018-05-07 DIAGNOSIS — Z87891 Personal history of nicotine dependence: Secondary | ICD-10-CM

## 2018-05-07 DIAGNOSIS — E669 Obesity, unspecified: Secondary | ICD-10-CM | POA: Diagnosis present

## 2018-05-07 DIAGNOSIS — E1151 Type 2 diabetes mellitus with diabetic peripheral angiopathy without gangrene: Secondary | ICD-10-CM | POA: Diagnosis present

## 2018-05-07 DIAGNOSIS — I723 Aneurysm of iliac artery: Secondary | ICD-10-CM | POA: Diagnosis present

## 2018-05-07 DIAGNOSIS — G4730 Sleep apnea, unspecified: Secondary | ICD-10-CM | POA: Diagnosis present

## 2018-05-07 DIAGNOSIS — J441 Chronic obstructive pulmonary disease with (acute) exacerbation: Secondary | ICD-10-CM | POA: Diagnosis not present

## 2018-05-07 DIAGNOSIS — G4733 Obstructive sleep apnea (adult) (pediatric): Secondary | ICD-10-CM | POA: Diagnosis not present

## 2018-05-07 DIAGNOSIS — J449 Chronic obstructive pulmonary disease, unspecified: Secondary | ICD-10-CM | POA: Diagnosis present

## 2018-05-07 DIAGNOSIS — I429 Cardiomyopathy, unspecified: Secondary | ICD-10-CM | POA: Diagnosis present

## 2018-05-07 DIAGNOSIS — Z955 Presence of coronary angioplasty implant and graft: Secondary | ICD-10-CM | POA: Diagnosis not present

## 2018-05-07 DIAGNOSIS — I252 Old myocardial infarction: Secondary | ICD-10-CM

## 2018-05-07 DIAGNOSIS — Z8249 Family history of ischemic heart disease and other diseases of the circulatory system: Secondary | ICD-10-CM

## 2018-05-07 DIAGNOSIS — Z8679 Personal history of other diseases of the circulatory system: Secondary | ICD-10-CM

## 2018-05-07 DIAGNOSIS — Z9889 Other specified postprocedural states: Secondary | ICD-10-CM

## 2018-05-07 DIAGNOSIS — I714 Abdominal aortic aneurysm, without rupture, unspecified: Secondary | ICD-10-CM | POA: Diagnosis present

## 2018-05-07 DIAGNOSIS — I998 Other disorder of circulatory system: Secondary | ICD-10-CM | POA: Diagnosis not present

## 2018-05-07 HISTORY — PX: ABDOMINAL AORTIC ENDOVASCULAR STENT GRAFT: SHX5707

## 2018-05-07 HISTORY — PX: FEMORAL ARTERY EXPLORATION: SHX5160

## 2018-05-07 HISTORY — PX: INTRAOPERATIVE ARTERIOGRAM: SHX5157

## 2018-05-07 LAB — URINALYSIS, ROUTINE W REFLEX MICROSCOPIC
Bacteria, UA: NONE SEEN
Bilirubin Urine: NEGATIVE
HGB URINE DIPSTICK: NEGATIVE
KETONES UR: NEGATIVE mg/dL
Leukocytes, UA: NEGATIVE
Nitrite: NEGATIVE
PH: 6 (ref 5.0–8.0)
PROTEIN: NEGATIVE mg/dL
Specific Gravity, Urine: 1.018 (ref 1.005–1.030)

## 2018-05-07 LAB — GLUCOSE, CAPILLARY
Glucose-Capillary: 127 mg/dL — ABNORMAL HIGH (ref 65–99)
Glucose-Capillary: 179 mg/dL — ABNORMAL HIGH (ref 65–99)
Glucose-Capillary: 181 mg/dL — ABNORMAL HIGH (ref 65–99)

## 2018-05-07 SURGERY — EXPLORATION, ARTERY, FEMORAL
Anesthesia: General | Laterality: Right

## 2018-05-07 SURGERY — INSERTION, ENDOVASCULAR STENT GRAFT, AORTA, ABDOMINAL
Anesthesia: General

## 2018-05-07 MED ORDER — PROPOFOL 10 MG/ML IV BOLUS
INTRAVENOUS | Status: AC
  Filled 2018-05-07: qty 20

## 2018-05-07 MED ORDER — FENTANYL CITRATE (PF) 100 MCG/2ML IJ SOLN
INTRAMUSCULAR | Status: AC
  Filled 2018-05-07: qty 2

## 2018-05-07 MED ORDER — SODIUM CHLORIDE 0.9 % IV SOLN
INTRAVENOUS | Status: AC
  Filled 2018-05-07: qty 1.2

## 2018-05-07 MED ORDER — FENTANYL CITRATE (PF) 250 MCG/5ML IJ SOLN
INTRAMUSCULAR | Status: AC
  Filled 2018-05-07: qty 5

## 2018-05-07 MED ORDER — PROPOFOL 10 MG/ML IV BOLUS
INTRAVENOUS | Status: DC | PRN
  Administered 2018-05-07: 150 mg via INTRAVENOUS

## 2018-05-07 MED ORDER — HEPARIN SODIUM (PORCINE) 1000 UNIT/ML IJ SOLN
INTRAMUSCULAR | Status: DC | PRN
  Administered 2018-05-07: 13000 [IU] via INTRAVENOUS
  Administered 2018-05-07: 2000 [IU] via INTRAVENOUS

## 2018-05-07 MED ORDER — FENTANYL CITRATE (PF) 100 MCG/2ML IJ SOLN
25.0000 ug | INTRAMUSCULAR | Status: DC | PRN
  Administered 2018-05-07 (×3): 50 ug via INTRAVENOUS

## 2018-05-07 MED ORDER — PHENYLEPHRINE 40 MCG/ML (10ML) SYRINGE FOR IV PUSH (FOR BLOOD PRESSURE SUPPORT)
PREFILLED_SYRINGE | INTRAVENOUS | Status: AC
  Filled 2018-05-07: qty 10

## 2018-05-07 MED ORDER — MEPERIDINE HCL 50 MG/ML IJ SOLN: 6.2500 mg | INTRAMUSCULAR | Status: DC | PRN

## 2018-05-07 MED ORDER — LIDOCAINE HCL (CARDIAC) PF 100 MG/5ML IV SOSY
PREFILLED_SYRINGE | INTRAVENOUS | Status: DC | PRN
  Administered 2018-05-07: 30 mg via INTRAVENOUS

## 2018-05-07 MED ORDER — PROTAMINE SULFATE 10 MG/ML IV SOLN
INTRAVENOUS | Status: AC
  Filled 2018-05-07: qty 5

## 2018-05-07 MED ORDER — HEPARIN SODIUM (PORCINE) 1000 UNIT/ML IJ SOLN
INTRAMUSCULAR | Status: AC
  Filled 2018-05-07: qty 1

## 2018-05-07 MED ORDER — ONDANSETRON HCL 4 MG/2ML IJ SOLN
INTRAMUSCULAR | Status: AC
  Filled 2018-05-07: qty 2

## 2018-05-07 MED ORDER — LACTATED RINGERS IV SOLN
INTRAVENOUS | Status: DC
  Administered 2018-05-07: 13:00:00 via INTRAVENOUS

## 2018-05-07 MED ORDER — PROTAMINE SULFATE 10 MG/ML IV SOLN
INTRAVENOUS | Status: DC | PRN
  Administered 2018-05-07: 20 mg via INTRAVENOUS
  Administered 2018-05-07: 10 mg via INTRAVENOUS
  Administered 2018-05-07: 20 mg via INTRAVENOUS
  Administered 2018-05-07: 30 mg via INTRAVENOUS

## 2018-05-07 MED ORDER — HYDROMORPHONE HCL 2 MG/ML IJ SOLN
0.2500 mg | INTRAMUSCULAR | Status: DC | PRN
  Administered 2018-05-07: 0.5 mg via INTRAVENOUS

## 2018-05-07 MED ORDER — FENTANYL CITRATE (PF) 100 MCG/2ML IJ SOLN
INTRAMUSCULAR | Status: DC | PRN
  Administered 2018-05-07 (×3): 50 ug via INTRAVENOUS
  Administered 2018-05-07: 150 ug via INTRAVENOUS

## 2018-05-07 MED ORDER — HEPARIN SODIUM (PORCINE) 5000 UNIT/ML IJ SOLN
INTRAMUSCULAR | Status: AC
  Filled 2018-05-07: qty 1.2

## 2018-05-07 MED ORDER — ONDANSETRON HCL 4 MG/2ML IJ SOLN
INTRAMUSCULAR | Status: DC | PRN
  Administered 2018-05-07: 4 mg via INTRAVENOUS

## 2018-05-07 MED ORDER — PHENYLEPHRINE HCL 10 MG/ML IJ SOLN
INTRAMUSCULAR | Status: DC | PRN
  Administered 2018-05-07: 100 ug via INTRAVENOUS

## 2018-05-07 MED ORDER — SUCCINYLCHOLINE CHLORIDE 20 MG/ML IJ SOLN
INTRAMUSCULAR | Status: DC | PRN
  Administered 2018-05-07: 140 mg via INTRAVENOUS

## 2018-05-07 MED ORDER — MIDAZOLAM HCL 2 MG/2ML IJ SOLN
INTRAMUSCULAR | Status: AC
  Filled 2018-05-07: qty 2

## 2018-05-07 MED ORDER — ONDANSETRON HCL 4 MG/2ML IJ SOLN
4.0000 mg | Freq: Once | INTRAMUSCULAR | Status: AC | PRN
  Administered 2018-05-08: 4 mg via INTRAVENOUS

## 2018-05-07 MED ORDER — HEPARIN SODIUM (PORCINE) 5000 UNIT/ML IJ SOLN
INTRAMUSCULAR | Status: DC | PRN
  Administered 2018-05-07: 500 mL

## 2018-05-07 MED ORDER — LACTATED RINGERS IV SOLN
INTRAVENOUS | Status: DC | PRN
  Administered 2018-05-07: 15:00:00 via INTRAVENOUS

## 2018-05-07 MED ORDER — HEMOSTATIC AGENTS (NO CHARGE) OPTIME
TOPICAL | Status: DC | PRN
  Administered 2018-05-07: 1 via TOPICAL

## 2018-05-07 MED ORDER — FENTANYL CITRATE (PF) 100 MCG/2ML IJ SOLN
INTRAMUSCULAR | Status: DC | PRN
  Administered 2018-05-07: 150 ug via INTRAVENOUS
  Administered 2018-05-07: 100 ug via INTRAVENOUS

## 2018-05-07 MED ORDER — SUCCINYLCHOLINE CHLORIDE 200 MG/10ML IV SOSY
PREFILLED_SYRINGE | INTRAVENOUS | Status: AC
  Filled 2018-05-07: qty 10

## 2018-05-07 MED ORDER — FENTANYL CITRATE (PF) 100 MCG/2ML IJ SOLN
50.0000 ug | Freq: Once | INTRAMUSCULAR | Status: AC
  Administered 2018-05-07: 50 ug via INTRAVENOUS

## 2018-05-07 MED ORDER — IODIXANOL 320 MG/ML IV SOLN
INTRAVENOUS | Status: DC | PRN
  Administered 2018-05-07: 50 mL via INTRAVENOUS

## 2018-05-07 MED ORDER — LABETALOL HCL 5 MG/ML IV SOLN
INTRAVENOUS | Status: DC | PRN
  Administered 2018-05-07 (×2): 10 mg via INTRAVENOUS

## 2018-05-07 MED ORDER — SUGAMMADEX SODIUM 500 MG/5ML IV SOLN
INTRAVENOUS | Status: AC
  Filled 2018-05-07: qty 5

## 2018-05-07 MED ORDER — 0.9 % SODIUM CHLORIDE (POUR BTL) OPTIME
TOPICAL | Status: DC | PRN
  Administered 2018-05-07: 2000 mL

## 2018-05-07 MED ORDER — CEFAZOLIN SODIUM 1 G IJ SOLR
INTRAMUSCULAR | Status: AC
  Filled 2018-05-07: qty 10

## 2018-05-07 MED ORDER — PHENYLEPHRINE 40 MCG/ML (10ML) SYRINGE FOR IV PUSH (FOR BLOOD PRESSURE SUPPORT)
PREFILLED_SYRINGE | INTRAVENOUS | Status: AC
Start: 2018-05-07 — End: ?
  Filled 2018-05-07: qty 10

## 2018-05-07 MED ORDER — SUGAMMADEX SODIUM 200 MG/2ML IV SOLN
INTRAVENOUS | Status: DC | PRN
  Administered 2018-05-07: 251.2 mg via INTRAVENOUS

## 2018-05-07 MED ORDER — PHENYLEPHRINE HCL 10 MG/ML IJ SOLN
INTRAVENOUS | Status: DC | PRN
  Administered 2018-05-07: 30 ug/min via INTRAVENOUS

## 2018-05-07 MED ORDER — ROCURONIUM BROMIDE 50 MG/5ML IV SOLN
INTRAVENOUS | Status: AC
  Filled 2018-05-07: qty 1

## 2018-05-07 MED ORDER — PROPOFOL 10 MG/ML IV BOLUS
INTRAVENOUS | Status: DC | PRN
  Administered 2018-05-07: 120 mg via INTRAVENOUS

## 2018-05-07 MED ORDER — SODIUM CHLORIDE 0.9 % IV SOLN
INTRAVENOUS | Status: DC | PRN
  Administered 2018-05-07 (×2): 500 mL

## 2018-05-07 MED ORDER — PROTAMINE SULFATE 10 MG/ML IV SOLN
INTRAVENOUS | Status: DC | PRN
  Administered 2018-05-07: 30 mg via INTRAVENOUS
  Administered 2018-05-07 (×2): 10 mg via INTRAVENOUS

## 2018-05-07 MED ORDER — DEXAMETHASONE SODIUM PHOSPHATE 10 MG/ML IJ SOLN
INTRAMUSCULAR | Status: DC | PRN
  Administered 2018-05-07: 10 mg via INTRAVENOUS

## 2018-05-07 MED ORDER — LIDOCAINE 2% (20 MG/ML) 5 ML SYRINGE
INTRAMUSCULAR | Status: AC
  Filled 2018-05-07: qty 5

## 2018-05-07 MED ORDER — IODIXANOL 320 MG/ML IV SOLN
INTRAVENOUS | Status: DC | PRN
  Administered 2018-05-07: 50 mL via INTRAVENOUS
  Administered 2018-05-07: 150 mL via INTRAVENOUS

## 2018-05-07 MED ORDER — HYDROMORPHONE HCL 2 MG/ML IJ SOLN
INTRAMUSCULAR | Status: AC
  Filled 2018-05-07: qty 1

## 2018-05-07 MED ORDER — CEFAZOLIN SODIUM-DEXTROSE 2-3 GM-%(50ML) IV SOLR
INTRAVENOUS | Status: DC | PRN
  Administered 2018-05-07: 2 g via INTRAVENOUS

## 2018-05-07 MED ORDER — LACTATED RINGERS IV SOLN
INTRAVENOUS | Status: DC
  Administered 2018-05-07 (×3): via INTRAVENOUS

## 2018-05-07 MED ORDER — SODIUM CHLORIDE 0.9 % IV SOLN
INTRAVENOUS | Status: DC
  Administered 2018-05-07: via INTRAVENOUS

## 2018-05-07 MED ORDER — ROCURONIUM BROMIDE 100 MG/10ML IV SOLN
INTRAVENOUS | Status: DC | PRN
  Administered 2018-05-07 (×3): 20 mg via INTRAVENOUS
  Administered 2018-05-07: 60 mg via INTRAVENOUS

## 2018-05-07 MED ORDER — 0.9 % SODIUM CHLORIDE (POUR BTL) OPTIME
TOPICAL | Status: DC | PRN
  Administered 2018-05-07: 1000 mL

## 2018-05-07 SURGICAL SUPPLY — 69 items
BAG BANDED W/RUBBER/TAPE 36X54 (MISCELLANEOUS) ×3 IMPLANT
BANDAGE ACE 4X5 VEL STRL LF (GAUZE/BANDAGES/DRESSINGS) IMPLANT
BANDAGE ESMARK 6X9 LF (GAUZE/BANDAGES/DRESSINGS) IMPLANT
BNDG ESMARK 6X9 LF (GAUZE/BANDAGES/DRESSINGS)
CANISTER SUCT 3000ML PPV (MISCELLANEOUS) ×3 IMPLANT
CANNULA VESSEL 3MM 2 BLNT TIP (CANNULA) IMPLANT
CATH EMB 3FR 80CM (CATHETERS) ×3 IMPLANT
CATH EMB 4FR 80CM (CATHETERS) ×3 IMPLANT
CLIP VESOCCLUDE MED 24/CT (CLIP) ×3 IMPLANT
CLIP VESOCCLUDE SM WIDE 24/CT (CLIP) ×3 IMPLANT
COVER DOME SNAP 22 D (MISCELLANEOUS) ×3 IMPLANT
COVER PROBE W GEL 5X96 (DRAPES) ×3 IMPLANT
CUFF TOURNIQUET SINGLE 24IN (TOURNIQUET CUFF) IMPLANT
CUFF TOURNIQUET SINGLE 34IN LL (TOURNIQUET CUFF) IMPLANT
CUFF TOURNIQUET SINGLE 44IN (TOURNIQUET CUFF) IMPLANT
DERMABOND ADVANCED (GAUZE/BANDAGES/DRESSINGS) ×2
DERMABOND ADVANCED .7 DNX12 (GAUZE/BANDAGES/DRESSINGS) ×1 IMPLANT
DRAIN CHANNEL 15F RND FF W/TCR (WOUND CARE) ×3 IMPLANT
DRAPE C-ARM 42X72 X-RAY (DRAPES) IMPLANT
ELECT REM PT RETURN 9FT ADLT (ELECTROSURGICAL) ×3
ELECTRODE REM PT RTRN 9FT ADLT (ELECTROSURGICAL) ×1 IMPLANT
EVACUATOR SILICONE 100CC (DRAIN) ×3 IMPLANT
GAUZE SPONGE 4X4 12PLY STRL (GAUZE/BANDAGES/DRESSINGS) ×3 IMPLANT
GLOVE BIO SURGEON STRL SZ7 (GLOVE) ×3 IMPLANT
GLOVE BIO SURGEON STRL SZ7.5 (GLOVE) ×6 IMPLANT
GLOVE BIOGEL M STRL SZ7.5 (GLOVE) ×3 IMPLANT
GLOVE BIOGEL PI IND STRL 6.5 (GLOVE) ×1 IMPLANT
GLOVE BIOGEL PI IND STRL 7.5 (GLOVE) ×2 IMPLANT
GLOVE BIOGEL PI INDICATOR 6.5 (GLOVE) ×2
GLOVE BIOGEL PI INDICATOR 7.5 (GLOVE) ×4
GOWN STRL REUS W/ TWL LRG LVL3 (GOWN DISPOSABLE) ×3 IMPLANT
GOWN STRL REUS W/TWL LRG LVL3 (GOWN DISPOSABLE) ×6
HEMOSTAT SPONGE AVITENE ULTRA (HEMOSTASIS) ×3 IMPLANT
INSERT FOGARTY SM (MISCELLANEOUS) IMPLANT
KIT BASIN OR (CUSTOM PROCEDURE TRAY) ×3 IMPLANT
KIT TURNOVER KIT B (KITS) ×3 IMPLANT
MARKER GRAFT CORONARY BYPASS (MISCELLANEOUS) IMPLANT
NS IRRIG 1000ML POUR BTL (IV SOLUTION) ×3 IMPLANT
PACK PERIPHERAL VASCULAR (CUSTOM PROCEDURE TRAY) ×3 IMPLANT
PAD ARMBOARD 7.5X6 YLW CONV (MISCELLANEOUS) ×6 IMPLANT
PATCH VASC XENOSURE 1CMX6CM (Vascular Products) ×2 IMPLANT
PATCH VASC XENOSURE 1X6 (Vascular Products) ×1 IMPLANT
SET MICROPUNCTURE 5F STIFF (MISCELLANEOUS) ×3 IMPLANT
SET Y EXTENSION LINE CSP (IV SETS) ×3 IMPLANT
STAPLER VISISTAT 35W (STAPLE) IMPLANT
STOPCOCK 4 WAY LG BORE MALE ST (IV SETS) ×3 IMPLANT
SUT ETHILON 3 0 PS 1 (SUTURE) ×3 IMPLANT
SUT GORETEX 5 0 TT13 24 (SUTURE) IMPLANT
SUT GORETEX 6.0 TT13 (SUTURE) IMPLANT
SUT MNCRL AB 4-0 PS2 18 (SUTURE) ×3 IMPLANT
SUT PROLENE 5 0 C 1 24 (SUTURE) ×6 IMPLANT
SUT PROLENE 6 0 BV (SUTURE) ×12 IMPLANT
SUT PROLENE 7 0 BV 1 (SUTURE) IMPLANT
SUT PROLENE BLUE 7 0 (SUTURE) ×3 IMPLANT
SUT SILK 2 0 PERMA HAND 18 BK (SUTURE) ×3 IMPLANT
SUT SILK 3 0 (SUTURE)
SUT SILK 3-0 18XBRD TIE 12 (SUTURE) IMPLANT
SUT VIC AB 2-0 CT1 27 (SUTURE) ×2
SUT VIC AB 2-0 CT1 TAPERPNT 27 (SUTURE) ×1 IMPLANT
SUT VIC AB 3-0 SH 27 (SUTURE) ×2
SUT VIC AB 3-0 SH 27X BRD (SUTURE) ×1 IMPLANT
SYR 20CC LL (SYRINGE) ×6 IMPLANT
SYR 3ML LL SCALE MARK (SYRINGE) ×6 IMPLANT
TAPE CLOTH SURG 4X10 WHT LF (GAUZE/BANDAGES/DRESSINGS) ×3 IMPLANT
TOWEL GREEN STERILE (TOWEL DISPOSABLE) ×3 IMPLANT
TRAY FOLEY MTR SLVR 16FR STAT (SET/KITS/TRAYS/PACK) IMPLANT
TUBING EXTENTION W/L.L. (IV SETS) ×3 IMPLANT
UNDERPAD 30X30 (UNDERPADS AND DIAPERS) ×3 IMPLANT
WATER STERILE IRR 1000ML POUR (IV SOLUTION) ×3 IMPLANT

## 2018-05-07 SURGICAL SUPPLY — 83 items
CANISTER SUCT 3000ML PPV (MISCELLANEOUS) ×3 IMPLANT
CANNULA VESSEL 3MM 2 BLNT TIP (CANNULA) ×3 IMPLANT
CATH ANGIO 5F BER2 65CM (CATHETERS) ×3 IMPLANT
CATH BEACON 5.038 65CM KMP-01 (CATHETERS) IMPLANT
CATH OMNI FLUSH .035X70CM (CATHETERS) ×3 IMPLANT
CATH SOFT-VU ST 4F 90CM (CATHETERS) ×3 IMPLANT
CLIP VESOCCLUDE MED 24/CT (CLIP) ×3 IMPLANT
CLIP VESOCCLUDE SM WIDE 24/CT (CLIP) ×3 IMPLANT
COVER PROBE W GEL 5X96 (DRAPES) IMPLANT
COVER SURGICAL LIGHT HANDLE (MISCELLANEOUS) ×3 IMPLANT
DERMABOND ADVANCED (GAUZE/BANDAGES/DRESSINGS) ×4
DERMABOND ADVANCED .7 DNX12 (GAUZE/BANDAGES/DRESSINGS) ×2 IMPLANT
DEVICE CLOSURE PERCLS PRGLD 6F (VASCULAR PRODUCTS) ×4 IMPLANT
DRSG TEGADERM 2-3/8X2-3/4 SM (GAUZE/BANDAGES/DRESSINGS) ×6 IMPLANT
DRYSEAL FLEXSHEATH 12FR 33CM (SHEATH) ×2
DRYSEAL FLEXSHEATH 18FR 33CM (SHEATH) ×2
ELECT REM PT RETURN 9FT ADLT (ELECTROSURGICAL) ×6
ELECTRODE REM PT RTRN 9FT ADLT (ELECTROSURGICAL) ×2 IMPLANT
EXCLUDER TNK LEG 31MX14X17 (Endovascular Graft) ×1 IMPLANT
EXCLUDER TRUNK LEG 31MX14X17 (Endovascular Graft) ×3 IMPLANT
EXTENDER ENDOPROSTHESIS 10X7 (Endovascular Graft) ×6 IMPLANT
GAUZE 4X4 16PLY RFD (DISPOSABLE) ×3 IMPLANT
GAUZE SPONGE 2X2 8PLY STRL LF (GAUZE/BANDAGES/DRESSINGS) ×2 IMPLANT
GLOVE BIO SURGEON STRL SZ7 (GLOVE) ×3 IMPLANT
GLOVE BIOGEL PI IND STRL 7.5 (GLOVE) ×1 IMPLANT
GLOVE BIOGEL PI INDICATOR 7.5 (GLOVE) ×2
GOWN STRL REUS W/ TWL LRG LVL3 (GOWN DISPOSABLE) ×3 IMPLANT
GOWN STRL REUS W/TWL LRG LVL3 (GOWN DISPOSABLE) ×6
GRAFT BALLN CATH 65CM (STENTS) ×1 IMPLANT
GUIDEWIRE ANGLED .035X150CM (WIRE) ×3 IMPLANT
GUIDEWIRE SAF TJ AMPL .035X180 (WIRE) ×6 IMPLANT
GUIDEWIRE WHOLEY .035 145 JTIP (WIRE) ×3 IMPLANT
HEMOSTAT SPONGE AVITENE ULTRA (HEMOSTASIS) ×3 IMPLANT
KIT BASIN OR (CUSTOM PROCEDURE TRAY) ×3 IMPLANT
KIT TURNOVER KIT B (KITS) ×3 IMPLANT
LEG CONTRALATERAL 16X12X12 (Vascular Products) ×3 IMPLANT
LEG CONTRALATERAL 16X12X14 (Vascular Products) ×4 IMPLANT
LOOP VESSEL MAXI BLUE (MISCELLANEOUS) ×3 IMPLANT
LOOP VESSEL MINI RED (MISCELLANEOUS) ×3 IMPLANT
NEEDLE PERC 18GX7CM (NEEDLE) ×3 IMPLANT
NS IRRIG 1000ML POUR BTL (IV SOLUTION) ×3 IMPLANT
PACK ENDOVASCULAR (PACKS) ×3 IMPLANT
PAD ARMBOARD 7.5X6 YLW CONV (MISCELLANEOUS) ×6 IMPLANT
PENCIL BUTTON HOLSTER BLD 10FT (ELECTRODE) ×3 IMPLANT
PERCLOSE PROGLIDE 6F (VASCULAR PRODUCTS) ×12
SET MICROPUNCTURE 5F STIFF (MISCELLANEOUS) ×3 IMPLANT
SHEATH AVANTI 11CM 5FR (SHEATH) ×6 IMPLANT
SHEATH AVANTI 11CM 8FR (SHEATH) ×3 IMPLANT
SHEATH BRITE TIP 6FR 35CM (SHEATH) IMPLANT
SHEATH BRITE TIP 8FR 23CM (SHEATH) ×3 IMPLANT
SHEATH DRYSEAL FLEX 12FR 33CM (SHEATH) ×1 IMPLANT
SHEATH DRYSEAL FLEX 18FR 33CM (SHEATH) ×1 IMPLANT
SPONGE GAUZE 2X2 STER 10/PKG (GAUZE/BANDAGES/DRESSINGS) ×4
STAPLER VISISTAT 35W (STAPLE) IMPLANT
STENT GRAFT BALLN CATH 65CM (STENTS) ×2
STENT GRAFT CONTRALAT 16X12X14 (Vascular Products) ×2 IMPLANT
STOPCOCK 4 WAY LG BORE MALE ST (IV SETS) ×3 IMPLANT
STOPCOCK MORSE 400PSI 3WAY (MISCELLANEOUS) ×3 IMPLANT
SUT ETHILON 3 0 PS 1 (SUTURE) IMPLANT
SUT MNCRL AB 4-0 PS2 18 (SUTURE) ×9 IMPLANT
SUT PROLENE 5 0 C 1 24 (SUTURE) ×6 IMPLANT
SUT PROLENE 5 0 C 1 36 (SUTURE) ×3 IMPLANT
SUT PROLENE 6 0 CC (SUTURE) ×6 IMPLANT
SUT SILK 2 0 (SUTURE) ×2
SUT SILK 2-0 18XBRD TIE 12 (SUTURE) ×1 IMPLANT
SUT SILK 3 0 (SUTURE) ×2
SUT SILK 3-0 18XBRD TIE 12 (SUTURE) ×1 IMPLANT
SUT SILK 4 0 (SUTURE) ×2
SUT SILK 4-0 18XBRD TIE 12 (SUTURE) ×1 IMPLANT
SUT VIC AB 2-0 CT1 27 (SUTURE) ×2
SUT VIC AB 2-0 CT1 TAPERPNT 27 (SUTURE) ×1 IMPLANT
SUT VIC AB 2-0 CTX 36 (SUTURE) IMPLANT
SUT VIC AB 3-0 SH 27 (SUTURE)
SUT VIC AB 3-0 SH 27X BRD (SUTURE) IMPLANT
SYR 30ML LL (SYRINGE) IMPLANT
SYR BULB IRRIGATION 50ML (SYRINGE) ×3 IMPLANT
TOWEL GREEN STERILE (TOWEL DISPOSABLE) ×3 IMPLANT
TRAY FOLEY MTR SLVR 16FR STAT (SET/KITS/TRAYS/PACK) ×3 IMPLANT
TUBING CIL FLEX 10 FLL-RA (TUBING) ×3 IMPLANT
TUBING HIGH PRESSURE 120CM (CONNECTOR) ×3 IMPLANT
WIRE AMPLATZ SS-J .035X180CM (WIRE) IMPLANT
WIRE BENTSON .035X145CM (WIRE) ×3 IMPLANT
WIRE TORQFLEX AUST .018X40CM (WIRE) ×3 IMPLANT

## 2018-05-07 NOTE — Interval H&P Note (Signed)
History and Physical Interval Note:  05/07/2018 3:27 PM  [name]  has presented today for surgery, with the diagnosis of ABDOMINAL AORTIC ANEURYSM  The various methods of treatment have been discussed with the patient and family. After consideration of risks, benefits and other options for treatment, the patient has consented to  Procedure(s): ABDOMINAL AORTIC ENDOVASCULAR STENT GRAFT (N/A) as a surgical intervention .  The patient's history has been reviewed, patient examined, no change in status, stable for surgery.  I have reviewed the patient's chart and labs.  Questions were answered to the patient's satisfaction.     Adele Barthel

## 2018-05-07 NOTE — Anesthesia Procedure Notes (Signed)
Procedure Name: Intubation Date/Time: 05/07/2018 8:11 PM Performed by: Eligha Bridegroom, CRNA Pre-anesthesia Checklist: Patient identified, Emergency Drugs available, Suction available, Patient being monitored and Timeout performed Patient Re-evaluated:Patient Re-evaluated prior to induction Oxygen Delivery Method: Circle system utilized Preoxygenation: Pre-oxygenation with 100% oxygen Induction Type: IV induction Ventilation: Mask ventilation without difficulty Laryngoscope Size: Mac and 4 Grade View: Grade I Tube type: Oral Tube size: 7.5 mm Number of attempts: 1 Placement Confirmation: ETT inserted through vocal cords under direct vision,  positive ETCO2 and breath sounds checked- equal and bilateral Secured at: 22 cm Tube secured with: Tape Dental Injury: Teeth and Oropharynx as per pre-operative assessment

## 2018-05-07 NOTE — Anesthesia Procedure Notes (Signed)
Arterial Line Insertion Start/End5/15/2019 3:37 PM Performed by: CRNA  Patient location: OR. radial was placed Catheter size: 20 G Hand hygiene performed  and maximum sterile barriers used   Attempts: 1 Procedure performed without using ultrasound guided technique. Following insertion, dressing applied. Patient tolerated the procedure well with no immediate complications.

## 2018-05-07 NOTE — Progress Notes (Signed)
   Daily Progress Note   R leg appears unchanged from OR appearance.  Prior monophasic right PT signal is gone and patient is complaining of pain in R leg.  - Will emergently bring patient to OR to right femoral exploration and other necessary procedures - I discussed this with his family who agree to proceeding with the procedure   Adele Barthel, MD, FACS [facility] Office: [phone] Pager: [phone]  05/07/2018, 7:40 PM

## 2018-05-07 NOTE — Op Note (Signed)
    NAME: [name]    MRN: [id number] DOB: 07/09/1956    DATE OF OPERATION: 05/07/2018  PREOP DIAGNOSIS:    Abdominal aortic aneurysm  POSTOP DIAGNOSIS:    Same  PROCEDURE:    1. Pre-closure right common femoral artery 2. Placement of contralateral limbs (3 components)  CO-SURGEON's: Judeth Cornfield. Scot Dock, MD, Adele Barthel, MD  ANESTHESIA: general   EBL: per anesthesia records  INDICATIONS:    [name] is a 62 y.o. male who presents for elective repair of his abdominal aortic aneurysm. He also has bilateral common iliac artery aneurysms. He has undergone previous coil embolization of both hypogastric arteries.  FINDINGS:    completion arteriogram showed the grafts in excellent position with no type I or type II endoleak's.  TECHNIQUE:   Given the complexity of the case, and the patients comorbidities, a two team approach was used in order to expedite the procedure. After the patient was prepped and draped in usual sterile fashion, on the right side under ultrasound guidance the right common femoral artery was cannulated with a micropuncture needle and a micropuncture sheath introduced over the wire. I then advanced a Bentson wire into the infrarenal aorta under fluoroscopic control and a long 8 French sheath was placed.  Once the trunk ipsilateral component had been placed the catheter was retracted for cannulation of the contralateral gate. I exchanged the pigtail catheter for an H2 catheter. This was used to direct the wire into the contralateral kidney. H2 catheter was exchanged for the pigtail catheter which was positioned in the main trunk and turned to confirm the correct position. The Amplatz wire was advanced through the catheter. The initial piece placed on the right was a 12 mm x 14 cm contralateral limb. This was positioned into the contralateral limb with 3 cm of overlap. We then positioned a 12 mm x 12 cm limb which overlapped into the initial piece. Finally a  10 mm x 7 cm piece was placed with 4 cm of proximal overlapping 3 cm of distal overlap. These components were all balloons at the completion of the procedure.  After the completion arteriogram was performed the Bentson wire had been placed up the right side. The sheath was removed and pressure held for hemostasis. The 2 Perclose devices were secured with good hemostasis. The wire was then removed. The Perclose devices were again secured for good hemostasis. The sutures were then cut and pressure held. The incision was closed with 4-0 Monocryl.   Deitra Mayo, MD, FACS [facility]  DATE OF DICTATION:   05/07/2018

## 2018-05-07 NOTE — Transfer of Care (Signed)
Immediate Anesthesia Transfer of Care Note  Patient: [name]  Procedure(s) Performed: ABDOMINAL AORTIC ENDOVASCULAR STENT GRAFT (N/A )  Patient Location: PACU  Anesthesia Type:General  Level of Consciousness: awake and alert   Airway & Oxygen Therapy: Patient Spontanous Breathing and Patient connected to nasal cannula oxygen  Post-op Assessment: Report given to RN and Post -op Vital signs reviewed and stable  Post vital signs: Reviewed and stable  Last Vitals:  Vitals Value Taken Time  BP 128/90 05/07/2018  7:09 PM  Temp    Pulse 64 05/07/2018  7:19 PM  Resp 13 05/07/2018  7:19 PM  SpO2 91 % 05/07/2018  7:19 PM  Vitals shown include unvalidated device data.  Last Pain:  Vitals:   05/07/18 1314  PainSc: 5       Patients Stated Pain Goal: 3 (07/68/08 8110)  Complications: No apparent anesthesia complications

## 2018-05-07 NOTE — Progress Notes (Signed)
Upon arrival to PACU unable to doppler right LE pulses.  Dr. Bridgett Larsson made aware and in to see patient.  Plan to return to OR.

## 2018-05-07 NOTE — Anesthesia Procedure Notes (Signed)
Procedure Name: Intubation Date/Time: 05/07/2018 3:48 PM Performed by: Eligha Bridegroom, CRNA Pre-anesthesia Checklist: Patient identified, Emergency Drugs available, Suction available, Patient being monitored and Timeout performed Patient Re-evaluated:Patient Re-evaluated prior to induction Oxygen Delivery Method: Circle system utilized Induction Type: IV induction Ventilation: Mask ventilation without difficulty and Oral airway inserted - appropriate to patient size Laryngoscope Size: Mac and 4 Grade View: Grade I Tube type: Oral Number of attempts: 1 Airway Equipment and Method: Stylet Secured at: 22 cm Tube secured with: Tape Dental Injury: Teeth and Oropharynx as per pre-operative assessment

## 2018-05-07 NOTE — Anesthesia Preprocedure Evaluation (Signed)
Anesthesia Evaluation  Patient identified by MRN, date of birth, ID band Patient awake    Reviewed: Allergy & Precautions, NPO status , Patient's Chart, lab work & pertinent test results  Airway Mallampati: I  TM Distance: >3 FB Neck ROM: Full    Dental   Pulmonary COPD, former smoker,    Pulmonary exam normal        Cardiovascular hypertension, Pt. on medications + CAD and + Past MI  Normal cardiovascular exam+ Cardiac Defibrillator      Neuro/Psych    GI/Hepatic GERD  Medicated and Controlled,  Endo/Other  diabetes, Type 2  Renal/GU      Musculoskeletal   Abdominal   Peds  Hematology   Anesthesia Other Findings   Reproductive/Obstetrics                             Anesthesia Physical Anesthesia Plan  ASA: III  Anesthesia Plan: General   Post-op Pain Management:    Induction: Intravenous and Cricoid pressure planned  PONV Risk Score and Plan: 2 and Treatment may vary due to age or medical condition  Airway Management Planned: Oral ETT  Additional Equipment:   Intra-op Plan:   Post-operative Plan: Extubation in OR  Informed Consent:   Plan Discussed with: CRNA and Surgeon  Anesthesia Plan Comments:         Anesthesia Quick Evaluation

## 2018-05-07 NOTE — Transfer of Care (Signed)
Immediate Anesthesia Transfer of Care Note  Patient: [name]  Procedure(s) Performed: FEMORAL ARTERY EXPLORATION, EXTENDED PROFUNDAPLASTY (Right ) INTRA OPERATIVE ARTERIOGRAM (Right )  Patient Location: PACU  Anesthesia Type:General  Level of Consciousness: sedated, drowsy, patient cooperative and responds to stimulation  Airway & Oxygen Therapy: Patient Spontanous Breathing and Patient connected to nasal cannula oxygen  Post-op Assessment: Report given to RN, Post -op Vital signs reviewed and stable and Patient moving all extremities X 4  Post vital signs: Reviewed and stable  Last Vitals:  Vitals Value Taken Time  BP 105/64 05/07/2018 11:31 PM  Temp    Pulse 72 05/07/2018 11:31 PM  Resp 23 05/07/2018 11:32 PM  SpO2 94 % 05/07/2018 11:31 PM  Vitals shown include unvalidated device data.  Last Pain:  Vitals:   05/07/18 1950  PainSc: 10-Worst pain ever      Patients Stated Pain Goal: 3 (58/83/25 4982)  Complications: No apparent anesthesia complications

## 2018-05-07 NOTE — Anesthesia Preprocedure Evaluation (Addendum)
Anesthesia Evaluation  Patient identified by MRN, date of birth, ID band Patient awake    Reviewed: Allergy & Precautions, H&P , NPO status , Patient's Chart, lab work & pertinent test results, reviewed documented beta blocker date and time   Airway Mallampati: II  TM Distance: >3 FB Neck ROM: Full    Dental no notable dental hx. (+) Edentulous Upper, Edentulous Lower, Dental Advisory Given   Pulmonary sleep apnea , COPD, former smoker,    Pulmonary exam normal breath sounds clear to auscultation       Cardiovascular hypertension, Pt. on medications and Pt. on home beta blockers + CAD, + Past MI and + Cardiac Stents  + pacemaker  Rhythm:Regular Rate:Normal     Neuro/Psych negative neurological ROS  negative psych ROS   GI/Hepatic negative GI ROS, Neg liver ROS,   Endo/Other  diabetes  Renal/GU negative Renal ROS  negative genitourinary   Musculoskeletal  (+) Arthritis , Osteoarthritis,    Abdominal   Peds  Hematology negative hematology ROS (+)   Anesthesia Other Findings   Reproductive/Obstetrics negative OB ROS                            Anesthesia Physical Anesthesia Plan  ASA: III  Anesthesia Plan: General   Post-op Pain Management:    Induction: Intravenous  PONV Risk Score and Plan: 3 and Ondansetron, Dexamethasone and Midazolam  Airway Management Planned: Oral ETT  Additional Equipment: Arterial line  Intra-op Plan:   Post-operative Plan: Extubation in OR  Informed Consent: I have reviewed the patients History and Physical, chart, labs and discussed the procedure including the risks, benefits and alternatives for the proposed anesthesia with the patient or authorized representative who has indicated his/her understanding and acceptance.   Dental advisory given  Plan Discussed with: CRNA  Anesthesia Plan Comments:         Anesthesia Quick Evaluation

## 2018-05-08 ENCOUNTER — Encounter (HOSPITAL_COMMUNITY): Payer: Self-pay | Admitting: Vascular Surgery

## 2018-05-08 ENCOUNTER — Other Ambulatory Visit: Payer: Self-pay

## 2018-05-08 ENCOUNTER — Inpatient Hospital Stay (HOSPITAL_COMMUNITY): Payer: BLUE CROSS/BLUE SHIELD

## 2018-05-08 DIAGNOSIS — Z9889 Other specified postprocedural states: Secondary | ICD-10-CM

## 2018-05-08 DIAGNOSIS — I998 Other disorder of circulatory system: Secondary | ICD-10-CM

## 2018-05-08 LAB — CBC
HCT: 35.9 % — ABNORMAL LOW (ref 39.0–52.0)
Hemoglobin: 12.3 g/dL — ABNORMAL LOW (ref 13.0–17.0)
MCH: 28.7 pg (ref 26.0–34.0)
MCHC: 34.3 g/dL (ref 30.0–36.0)
MCV: 83.7 fL (ref 78.0–100.0)
PLATELETS: 241 10*3/uL (ref 150–400)
RBC: 4.29 MIL/uL (ref 4.22–5.81)
RDW: 13.2 % (ref 11.5–15.5)
WBC: 13.7 10*3/uL — ABNORMAL HIGH (ref 4.0–10.5)

## 2018-05-08 LAB — BASIC METABOLIC PANEL
Anion gap: 11 (ref 5–15)
BUN: 11 mg/dL (ref 6–20)
CO2: 24 mmol/L (ref 22–32)
CREATININE: 1 mg/dL (ref 0.61–1.24)
Calcium: 8.2 mg/dL — ABNORMAL LOW (ref 8.9–10.3)
Chloride: 102 mmol/L (ref 101–111)
GFR calc Af Amer: >60 mL/min (ref >60)
Glucose, Bld: 222 mg/dL — ABNORMAL HIGH (ref 65–99)
POTASSIUM: 4.6 mmol/L (ref 3.5–5.1)
SODIUM: 137 mmol/L (ref 135–145)

## 2018-05-08 LAB — APTT: aPTT: 34 seconds (ref 24–36)

## 2018-05-08 LAB — HEMOGLOBIN AND HEMATOCRIT, BLOOD
HCT: 37.8 % — ABNORMAL LOW (ref 39.0–52.0)
Hemoglobin: 12.8 g/dL — ABNORMAL LOW (ref 13.0–17.0)

## 2018-05-08 LAB — PREPARE RBC (CROSSMATCH)

## 2018-05-08 LAB — MAGNESIUM: Magnesium: 1.5 mg/dL — ABNORMAL LOW (ref 1.7–2.4)

## 2018-05-08 LAB — PROTIME-INR
INR: 1.14
PROTHROMBIN TIME: 14.5 s (ref 11.4–15.2)

## 2018-05-08 MED ORDER — HYDRALAZINE HCL 20 MG/ML IJ SOLN: 5.0000 mg | INTRAMUSCULAR | Status: DC | PRN

## 2018-05-08 MED ORDER — POTASSIUM CHLORIDE CRYS ER 20 MEQ PO TBCR: 20.0000 meq | EXTENDED_RELEASE_TABLET | Freq: Every day | ORAL | Status: DC | PRN

## 2018-05-08 MED ORDER — GUAIFENESIN-DM 100-10 MG/5ML PO SYRP: 15.0000 mL | ORAL_SOLUTION | ORAL | Status: DC | PRN

## 2018-05-08 MED ORDER — CARVEDILOL 3.125 MG PO TABS
3.1250 mg | ORAL_TABLET | Freq: Two times a day (BID) | ORAL | Status: DC
  Administered 2018-05-08 – 2018-05-10 (×5): 3.125 mg via ORAL
  Filled 2018-05-08 (×6): qty 1

## 2018-05-08 MED ORDER — MORPHINE SULFATE (PF) 4 MG/ML IV SOLN
4.0000 mg | INTRAVENOUS | Status: DC | PRN
  Administered 2018-05-08 (×3): 4 mg via INTRAVENOUS
  Filled 2018-05-08 (×3): qty 1

## 2018-05-08 MED ORDER — ONDANSETRON HCL 4 MG/2ML IJ SOLN
INTRAMUSCULAR | Status: AC
  Filled 2018-05-08: qty 2

## 2018-05-08 MED ORDER — POLYETHYLENE GLYCOL 3350 17 G PO PACK: 17.0000 g | PACK | Freq: Every day | ORAL | Status: DC | PRN

## 2018-05-08 MED ORDER — PHENOL 1.4 % MT LIQD: 1.0000 | OROMUCOSAL | Status: DC | PRN

## 2018-05-08 MED ORDER — CEFAZOLIN SODIUM-DEXTROSE 2-4 GM/100ML-% IV SOLN
2.0000 g | Freq: Three times a day (TID) | INTRAVENOUS | Status: AC
  Administered 2018-05-08 (×2): 2 g via INTRAVENOUS
  Filled 2018-05-08 (×2): qty 100

## 2018-05-08 MED ORDER — PANTOPRAZOLE SODIUM 40 MG PO TBEC
40.0000 mg | DELAYED_RELEASE_TABLET | Freq: Every day | ORAL | Status: DC
  Administered 2018-05-08 – 2018-05-10 (×3): 40 mg via ORAL
  Filled 2018-05-08 (×3): qty 1

## 2018-05-08 MED ORDER — VITAMIN B-12 1000 MCG PO TABS
1000.0000 ug | ORAL_TABLET | Freq: Every day | ORAL | Status: DC
  Administered 2018-05-08 – 2018-05-10 (×3): 1000 ug via ORAL
  Filled 2018-05-08 (×3): qty 1

## 2018-05-08 MED ORDER — LOSARTAN POTASSIUM 25 MG PO TABS
25.0000 mg | ORAL_TABLET | Freq: Every day | ORAL | Status: DC
  Administered 2018-05-08 – 2018-05-10 (×3): 25 mg via ORAL
  Filled 2018-05-08 (×3): qty 1

## 2018-05-08 MED ORDER — MAGNESIUM SULFATE 2 GM/50ML IV SOLN: 2.0000 g | Freq: Every day | INTRAVENOUS | Status: DC | PRN

## 2018-05-08 MED ORDER — NITROGLYCERIN 0.4 MG SL SUBL: 0.4000 mg | SUBLINGUAL_TABLET | SUBLINGUAL | Status: DC | PRN

## 2018-05-08 MED ORDER — ASPIRIN 81 MG PO CHEW
81.0000 mg | CHEWABLE_TABLET | Freq: Every day | ORAL | Status: DC
  Administered 2018-05-08 – 2018-05-10 (×3): 81 mg via ORAL
  Filled 2018-05-08 (×3): qty 1

## 2018-05-08 MED ORDER — OXYCODONE-ACETAMINOPHEN 5-325 MG PO TABS
1.0000 | ORAL_TABLET | ORAL | Status: DC | PRN
  Administered 2018-05-08 – 2018-05-10 (×9): 2 via ORAL
  Filled 2018-05-08 (×9): qty 2

## 2018-05-08 MED ORDER — LABETALOL HCL 5 MG/ML IV SOLN: 10.0000 mg | INTRAVENOUS | Status: DC | PRN

## 2018-05-08 MED ORDER — SPIRONOLACTONE 12.5 MG HALF TABLET
12.5000 mg | ORAL_TABLET | Freq: Every day | ORAL | Status: DC
  Administered 2018-05-08 – 2018-05-10 (×3): 12.5 mg via ORAL
  Filled 2018-05-08 (×3): qty 1

## 2018-05-08 MED ORDER — ACETAMINOPHEN 325 MG RE SUPP: 325.0000 mg | RECTAL | Status: DC | PRN

## 2018-05-08 MED ORDER — CLOPIDOGREL BISULFATE 75 MG PO TABS
75.0000 mg | ORAL_TABLET | Freq: Every day | ORAL | Status: DC
  Administered 2018-05-08 – 2018-05-10 (×3): 75 mg via ORAL
  Filled 2018-05-08 (×3): qty 1

## 2018-05-08 MED ORDER — METOPROLOL TARTRATE 5 MG/5ML IV SOLN: 2.0000 mg | INTRAVENOUS | Status: DC | PRN

## 2018-05-08 MED ORDER — ATORVASTATIN CALCIUM 80 MG PO TABS
80.0000 mg | ORAL_TABLET | Freq: Every day | ORAL | Status: DC
  Administered 2018-05-08 – 2018-05-09 (×2): 80 mg via ORAL
  Filled 2018-05-08 (×3): qty 1

## 2018-05-08 MED ORDER — SODIUM CHLORIDE 0.9 % IV SOLN: 500.0000 mL | Freq: Once | INTRAVENOUS | Status: DC | PRN

## 2018-05-08 MED ORDER — SODIUM CHLORIDE 0.9 % IV SOLN
Freq: Once | INTRAVENOUS | Status: AC
  Administered 2018-05-08: via INTRAVENOUS

## 2018-05-08 MED ORDER — ACETAMINOPHEN 325 MG PO TABS: 325.0000 mg | ORAL_TABLET | ORAL | Status: DC | PRN

## 2018-05-08 MED ORDER — ALUM & MAG HYDROXIDE-SIMETH 200-200-20 MG/5ML PO SUSP: 15.0000 mL | ORAL | Status: DC | PRN

## 2018-05-08 MED ORDER — HEPARIN SODIUM (PORCINE) 5000 UNIT/ML IJ SOLN
5000.0000 [IU] | Freq: Three times a day (TID) | INTRAMUSCULAR | Status: DC
  Administered 2018-05-08 – 2018-05-10 (×7): 5000 [IU] via SUBCUTANEOUS
  Filled 2018-05-08 (×7): qty 1

## 2018-05-08 MED ORDER — DOCUSATE SODIUM 100 MG PO CAPS
100.0000 mg | ORAL_CAPSULE | Freq: Every day | ORAL | Status: DC
  Filled 2018-05-08 (×3): qty 1

## 2018-05-08 MED ORDER — ONDANSETRON HCL 4 MG/2ML IJ SOLN
4.0000 mg | Freq: Four times a day (QID) | INTRAMUSCULAR | Status: DC | PRN
  Administered 2018-05-08 (×2): 4 mg via INTRAVENOUS
  Filled 2018-05-08 (×3): qty 2

## 2018-05-08 NOTE — Op Note (Signed)
OPERATIVE NOTE   PROCEDURE: 1. Open exposure and repair of left common femoral artery  2. Endovascular aortic repair with main body with attached limb (Gore C3 31 mm x 14 mm x 17 cm) 3. Placement of iliac limb extension x 2 (Gore 12 mm x 14 cm, Gore 10 mm x 7 cm)  PRE-OPERATIVE DIAGNOSIS: moderate abdominal aortic aneurysm, large bilateral common iliac artery aneurysms   POST-OPERATIVE DIAGNOSIS: same as above   CO-SURGEONS:  Adele Barthel, MD, Gae Gallop, MD  ANESTHESIA: general  ESTIMATED BLOOD LOSS: 300 cc  FINDING(S): 1.  Successful exclusion of aneurysm at end of case  2.  Endoleaks: none 3.  Bilateral patent renal arteries  4.  No internal iliac artery flow bilateral 5.  Poor opacification of left iliac limbs despite physiologic evidence of right common femoral artery perfusion 6.  Distal pedal signals: Right posterior tibial artery monophasic, left posterior tibial artery biphasic  SPECIMEN(S):  none  INDICATIONS:   [name] is a 62 y.o. (1956/04/28) male who presents with moderate sized abdominal aortic aneurysm and large bilateral common iliac artery aneurysms.  To facilitate repair, I recommend bilateral internal iliac artery embolization and endovascular aortic repair.  The left internal iliac artery was embolized and then the right embolized in a staged fashion to try to get pelvic collateralization.  Patient present today for endovascular aortic repair to finish the repair of his aneurysm.  The patient is aware the risks of endovascular aortic surgery include but are not limited to: bleeding, access site complications, need for transfusion, infection, death, stroke, paralysis, wound complications, spinal, pelvic and bowel ischemia, extended ventilation, anaphylactic reaction to contrast, contrast induced nephropathy, embolism, and need for additional procedure to address endoleaks.  Overall, I cited a mortality rate of 1-2% and morbidity rate of 15%.   The patient is aware of the risks and agree to proceed.   DESCRIPTION: After obtaining full informed written consent, the patient was brought back to the operating room and placed supine upon the operating table.  The patient received IV antibiotics prior to induction.  A procedure time out was completed and the correct surgical site was verified.  After obtaining adequate anesthesia, the patient was prepped and draped in the standard fashion for: open or endovascular aortic procedure.  A two surgeon technique was utilized to maintain constant control of the main body of the endograft and to expedite completion of this complex case.  Ipsilateral Access (Left)  I turned my attention to the left groin  Under ultrasound guidance,  the common femoral artery was then cannulated with a 18 gauge needle.  The Bentson wire was passed up into the distal external iliac artery.  The needle was exchanged for a 8-Fr dilator, which was used to dilate the subcutaneous tract and arterial puncture.  Even with the dilator, I could not get the wire to pass more proximally.  I did a hand injection via the dilator which demonstrated a twist in the distal external iliac artery.  I switched to a Glidewire but the wire still did not cross the distal external iliac artery.  I tried exchanging the dilator for a 4-Fr straight catheter.  This combination failed to cross the distal external iliac artery.  On one attempt the catheter flipped the wire out of the artery, so I held pressure for 5 minutes.  Meanwhile Dr. Scot Dock got contralateral access via the Preclose technique.  See his notes for details.  After waiting 5 minutes,  no further active bleeding occurred.  I repeated the same process and this time cannulated the left common femoral artery under Sonosite guidance with the micropuncture system.  The microwire also hung up on the distal external iliac artery.  The microsheath easily passed into the left common iliac artery.   I placed a Bentson wire through the microsheath.  The wire hung up in the distal external iliac artery.  I tried to load a 5-Fr sheath, but this did not track and kicked the wire out.  I held pressure for 5 minutes  Heparinization The patient was given 13000 units of Heparin intravenously, which was a therapeutic bolus. An additional 2000 units of Heparin was administered every hour after initial bolus to maintain anticoagulation.  In total, 15000 units of Heparin was administrated to achieve and maintain a therapeutic level of anticoagulation.  Left Femoral Exposure I made an oblique incision in the left groin, one finger width above the groin crease.  I dissected down the common femoral artery with some difficult due to obesity.  The common femoral artery was 5-6 cm deep.  I exposed a 3 cm segment of the left common femoral artery.  I placed a vessel loop for control.  I cannulated this left common femoral artery with a 18 gauge needle and passed the Bentson wire into the distal external iliac artery.  This time I could pass a 5-Fr sheath into the distal external iliac artery.  Using a Glidewire and BER-2, I cross the left external iliac artery stenosis and advanced into the tortuous aorta, eventually getting into the suprarenal aorta.  The wire was exchanged for an Amplatz wire which was advanced into the descending thoracic aorta.  The catheter was removed and then left sheath was exchanged for a 18-Fr Dryseal sheath.  The dilator was removed and the sheath loaded with heparinized saline.    Placement of Main Body of Endograft and Initial Aortogram At this point, a BER-2 catheter was loaded over the contralateral wire and used with the Glidewire to steering into the suprarenal segment of the aorta.  I exchanged the BER-2 for a marker pigtail catheter, which was placed in the presumed immediate suprarenal position.  The wire was removed and the pigtail catheter was connected to the power injector  circuit.  The main body of the endograft (Gore C3 31 mm x 14 mm x 17 cm) was loaded over ipsilateral wire into the presumed infrarenal position.  A power injector aortogram was completed at appropriate angulation to evaluate the takeoff of the renal arteries.  I deployed the main body distal to the infrarenal bend in the aorta.  A completion aortogram was completed which demonstrated: inadequate expansion of top of graft again left wall.  This was felt to be due to residual constraint of the graft.  In the event of inadequate apposition, placement of cuff was planned.  Contralateral Gate Cannulation, Contralateral limb extension At this point, Dr. Scot Dock cannulated the contralateral gate and placed multiple limb extension as documented in his notes.  This was necessary due to the length of this patient iliac segments and need to cover the bilateral internal iliac artery but extending into the external iliac artery.  Ipsilateral Limb Extension At this point, I turned my attention to the ipsilateral groin.  I released the ipsilateral limb and released the main body.  I removed the stent delivery system.  I I did a right anterior oblique pelvic injection to image the level of the external  iliac artery.  The left internal iliac artery was marked by coils.  I selected a Gore 12 mm x 14 cm iliac limb extension and deployed it with adequate overlap in the ipsilateral limb, landing in the distal segment in the left common iliac artery.  The stent delivery device was removed.  A bridging piece, Gore 10 mm x 7 cm, was then placed with adequate overlapping, landing distally into the left external iliac artery with 2 cm overlap.  Aortic and Limb Molding The aortic molding and occlusion balloon (MOB) was loaded on the ipsilateral wire and advanced into the main body.  I molded the proximal graft, overlapping segments and the distal extent of the graft in the ipsilateral iliac artery.  The balloon was deflated and  placed on the contralateral wire.  Dr. Scot Dock molded the overlapping iliac limb segments with the MOB.  The balloon was deflated and removed.  Completion Aortogram The pigtail catheter was replaced on the ipsilateral wire and advanced into the suprarenal aorta.  The wire was removed and the catheter was connected to the power injector circuit.  A power injector aortogram was completed: no endoleak was evident with intact bilateral renal artery flow without any internal iliac artery flow.  The main body looked incompletely apposed up top.  There also did not appear be flow down the left iliac limb, despite have pulsatile bleeding from the sheath.    The MOB was reloaded on the right wire and I inflated the MOB at the top of the main body.  On completion aortogram, there was no proximal endoleak with near total wall apposition.  There are appeared to a small, 1 mm segment of inadequate wall apposition, due to the angulation in the infrarenal neck.  Again, there appeared to be no flow in the left iliac limbs despite clinical evidence of intact flow.  Dr. Scot Dock replaced the Bentson wire into the catheter, straightening out the crook in the catheter.  He  removed the catheter.  I then removed the left Amplatz wire from the sheath.    Completion of Pre-close Repair of Common Femoral Arteries At this point, I gave 50 mg of Protamine to reverse anticoagulation.  Dr. Scot Dock completed the preclose repair of the right common femoral artery, see his notes for details.    Repair of Left Common Femoral Artery  I turned my attention to the left femoral artery.  I clamped it distally and then clamped it proximally as the sheath was removed.  I repaired the left common femoral artery with two running 6-0 Prolene stitches, running one from each end of the arteriotomy.  I took extra care to tack the anterior plaque to the suture line.  Prior to completion, I backbled all arteries.  No clot was noted. I packed this  incision with Avitene and held pressure.    Meanwhile, Dr. Scot Dock closed the right stab incision as documented in his note.  The skin was cleaned, dried, and Dermabond used to reinforce the skin closures.  A sterile dressing was applied to the right groin.  After waiting a few minutes, no further active bleeding was noted.  A few points of ooze were controlled with electrocautery.  I repaired the left groin with a double layer of 2-0 Vicryl immediately above the artery.  The superficial subcutaneous tissue was reapproximated with a double layer of 3-0 Vicryl.  The skin was reapproximated with a running subcuticular stitch of 4-0 Monocryl.  The skin was cleaned, dried, and  reinforced with Dermabond.  Completion Distal Pulse Distally, I could doppler on the right: monophasic posterior tibial artery , and on the left: biphasic posterior tibial artery.   Adele Barthel, MD, FACS [facility] [place] Office: [phone] Pager: [phone]  05/08/2018, 1:02 AM

## 2018-05-08 NOTE — Progress Notes (Signed)
VASCULAR LAB PRELIMINARY  ARTERIAL  ABI completed:    RIGHT    LEFT    PRESSURE WAVEFORM  PRESSURE WAVEFORM  BRACHIAL 134 Triphasic BRACHIAL 158 Triphasic  DP 68 Monophasic DP 75 Monophasic  AT   AT    PT 75 Monophasic PT 67 Biphasic  PER   PER    GREAT TOE  Trivial dampened waveform GREAT TOE  Unable to identify discernable waveform    RIGHT LEFT  ABI 0.47 0.47   Bilateral ABIs are suggestive of severe arterial insufficiency at rest.  Preliminary results discussed with Dr. Bridgett Larsson.  05/08/2018 11:26 AM Maudry Mayhew, BS, RVT, RDCS, RDMS

## 2018-05-08 NOTE — Progress Notes (Signed)
Inpatient Diabetes Program Recommendations  AACE/ADA: New Consensus Statement on Inpatient Glycemic Control (2015)  Target Ranges:  Prepandial:   less than 140 mg/dL      Peak postprandial:   less than 180 mg/dL (1-2 hours)      Critically ill patients:  140 - 180 mg/dL   Lab Results  Component Value Date   GLUCAP 179 (H) 05/07/2018   HGBA1C 7.3 (H) 04/30/2018    Review of Glycemic Control Results for KEYMON, MCELROY (MRN [id number]) as of 05/08/2018 08:39  Ref. Range 05/07/2018 12:57 05/07/2018 19:10 05/07/2018 23:32  Glucose-Capillary Latest Ref Range: 65 - 99 mg/dL 127 (H) 181 (H) 179 (H)   Diabetes history: Type 2 DM, diet controlled Outpatient Diabetes medications: none Current orders for Inpatient glycemic control: none  Inpatient Diabetes Program Recommendations:    Noted patient received Decadron 10 mg X1 during surgery, expect BS to increase.   Noted order for RN to notify MD when BS >140 mg/dL to initiate glycemic control order set. Would recommend placing orders for glycemic control order set, specifically Novolog 0-9 units TID +HS.  Thanks, Bronson Curb, MSN, RNC-OB Diabetes Coordinator [phone] (8a-5p)

## 2018-05-08 NOTE — Op Note (Signed)
OPERATIVE NOTE   PROCEDURE: 1. Limited femoral endarterectomy with extended profundaplasty with bovine pericardial patch angioplasty 2. Open cannulation of profunda femoral artery  3. Right leg runoff  PRE-OPERATIVE DIAGNOSIS: acute right leg ischemia  POST-OPERATIVE DIAGNOSIS: same as above   SURGEON: Adele Barthel, MD  ASSISTANT(S): Dagoberto Ligas, PAC  ANESTHESIA: general  ESTIMATED BLOOD LOSS: 300 cc  FINDING(S): 1.  Dissected right profunda femoral artery with extensive intimal damage 2.  Proglide sutures in proximal profunda femoral artery  3.  No thrombus in profunda femoral artery  4.  Backbleeding present from profunda femoral artery  5.  Chronically occluded superficial femoral artery  6.  Collaterals fill popliteal artery at the knee  7.  All tibial arteries patent at proximal calf with diffuse disease 8.  Dopplerable R AT > PT at end of case  SPECIMEN(S): none  INDICATIONS:   [name] is a 62 y.o. male who presents with EVAR earlier this evening.  In the PACU, pt lost monophasic signals in right leg.  Given the right common femoral artery was cannulated and closed via percutaneous technique.  I had concerns this resulted in acute disruption of blood flow in the right leg.  He was taken emergently to the operating room for right femoral artery exploration.   DESCRIPTION: After obtaining full informed written consent, the patient was brought back to the operating room and placed supine upon the operating table.  The patient received IV antibiotics prior to induction.  A procedure time out was completed and the correct surgical site was verified.  After obtaining adequate anesthesia, the patient was prepped and draped in the standard fashion for: right femoropopliteal bypass.  Under Sonosite guidance, I marked the common femoral artery.  I made a longitudinal incision over the right common femoral artery.  I dissected down to the femoral artery with some  difficulty due to the patient's obesity.  I identified a structure that would turn out to be the superficial femoral artery.  There was no pulse in this vessel.  I dissected more proximally, identifying a high bifurcation in the femoral artery.  This lead to discovery of the profunda femoral artery.  The proglide sutures appeared to be present the anteromedial wall of the proximal 1/3 of the profunda femoral artery.  I dissected out the distal external iliac artery, profunda femoral artery and superficial femoral artery.  Vessel loops were applied to all arteries.  The patient was given 13000 units of Heparin intravenously, which was a therapeutic bolus. An additional 2000 units of Heparin was administered every hour after initial bolus to maintain anticoagulation.  In total, 15000 units of Heparin was administrated to achieve and maintain a therapeutic level of anticoagulation.  After waiting 3 minutes, I clamped the distal external iliac artery.  I made arteriotomy from the distal common femoral artery into the profunda femoral artery.  Immediately extensive damage to the intima and wall was evident.  The proximal wall of the profunda femoral artery appeared to be torn partially off the femoral bifurcation.  There was extensive anterior plaque in the common femoral artery, extending down distal to the Proglide sutures, which appeared to be in the anterior wall.  There was a posterior plaque that appeared to be raised in the proximal profunda femoral artery.  There was intact reasonable backbleeding from the distal profunda femoral artery.  I passed a 3 mm Fogarty distally and no thrombus was noted.  I tried pass the 3 Fogarty catheter  proximally, but these was substantial resistance encountered.  There remained pulsatile bleeding from the distal external iliac artery.  The distal external iliac artery was reclamped.  I tried to pass the 3 Fogarty distally but there was essentially no lumen in the superficial  femoral artery.  I sharply debrided portions of the damaged wall.  I also performed a limited endarterectomy on dissection flaps.  I had to repair a portion of the proximal profunda femoral artery with interrupted 7-0 Prolene stitches.  After removing all flow limiting dissection flaps, I felt patching the common femoral artery and profunda femoral artery was indicated.  I obtained a bovine pericardial patch and fashioned it for the dimension of the common femoral artery down to the profunda femoral artery distal to the Proglide sutures.  I sewed this patch in place with two 5-0 Prolene sutures, taking care to take large bites in this damaged arterial wall.  Prior completing this patch angioplasty, I backbled the distal profunda femoral artery.  There remain reasonable backbleeding.  This large profunda femoral artery caliber suggests strongly that the right superficial femoral artery is chronically occluded.  Antegrade bleeding from distal external iliac artery was pulsatile.  At this point, right foot had pinked up remarkably and now an anterior tibial artery and posterior tibial artery signal were dopplerable.  I elected to complete a completion angiogram.  I cannulated the bovine patch in the profunda femoral artery.  I passed the wire into the common femoral artery and then exchanged the needle for the microsheath which I placed into the profunda femoral artery and stopping in the common femoral artery.  I completed the right leg runoff in stations.  This demonstrated chronic occlusion of right superficial femoral artery and collateral flow into right at-the-knee popliteal artery leading to diseased 3 vessel runoff.    I placed a 6-0 Prolene Z-stitch around the sheath and tie the stitch while pulling the sheath.  At this point, patient was given a total 80 mg of Protamine.  Bleeding points in the suture line were repaired with 6-0 sutures.  Finally no active bleeding was present.  I elected to place a  Blake drain in the right groin due to risk of acute rebleeding.  Trocar of the drain was passed distal to the incision in the subcutaneous tissue.  I closed the deep subcutaneous tissue overlying common femoral artery and drain with a double layer of 2-0 Vicryl.  The superficial tissue was reapproximated with a double layer of 3-0 Vicryl.  The skin was reapproximated with a running subcuticular stitch of 4-0 Monocryl.  The skin was cleaned, dried, and reinforced with Dermabond.  Distally at the end of the case: AT>PT.   COMPLICATIONS: none  CONDITION: stable   Adele Barthel, MD, [facility] [place] Office: [phone] Pager: [phone]  05/08/2018, 1:07 AM

## 2018-05-08 NOTE — Anesthesia Postprocedure Evaluation (Signed)
Anesthesia Post Note  Patient: NEMIAH KISSNER  Procedure(s) Performed: FEMORAL ARTERY EXPLORATION, EXTENDED PROFUNDAPLASTY (Right ) INTRA OPERATIVE ARTERIOGRAM (Right )     Patient location during evaluation: PACU Anesthesia Type: General Level of consciousness: awake and alert Pain management: pain level controlled Vital Signs Assessment: post-procedure vital signs reviewed and stable Respiratory status: spontaneous breathing, nonlabored ventilation, respiratory function stable and patient connected to nasal cannula oxygen Cardiovascular status: blood pressure returned to baseline and stable Postop Assessment: no apparent nausea or vomiting Anesthetic complications: no    Last Vitals:  Vitals:   05/08/18 0045 05/08/18 0100  BP: 115/62 114/64  Pulse: 67 65  Resp: 18 16  Temp: 36.7 C   SpO2: 98% 99%    Last Pain:  Vitals:   05/08/18 0045  TempSrc: Oral  PainSc: [place]                 Kalani Sthilaire DAVID

## 2018-05-08 NOTE — Progress Notes (Addendum)
   Daily Progress Note   Assessment/Planning:   POD #1 s/p EVAR complicated by R femoral injury by sheath leading to Limited R fem EA, profundaplasty   Signals in R foot worse then at end of case but still intact  Will recheck ABI  On completion angiogram, there is no direct route between the femoral and tibial so I doubt embolization.  There is also diffuse tibial disease c/w diabetic status.  If R PFA is reoccluding, only remaining option is to attempt R EIA to CFA bypass.  Problem is the diseased R EIA and limited un-stented length remaining.  In regards to his neuropathic sx, pt is an diabetic with Hg A1c 7.3 (BG 222-357 while hosp).  He has history of sx that resolve with ambulation suggesting some spinal involvement also   Subjective  - 1 Day Post-Op   Move R foot better today, some baseline numbness in R foot, 2 u PRBC ordered given hypotension in PACU and 500-600 cc EBL total yesterday   Objective   Vitals:   05/08/18 0100 05/08/18 0327 05/08/18 0408 05/08/18 0442  BP: 114/64 131/66 110/66 125/70  Pulse: 65 73 75   Resp: 16 19 15 11   Temp:  99.1 F (37.3 C) 99.5 F (37.5 C)   TempSrc:  Oral Oral   SpO2: 99% 96% 95% 95%  Weight:      Height:         Intake/Output Summary (Last 24 hours) at 05/08/2018 0717 Last data filed at 05/08/2018 5176 Gross per 24 hour  Intake 3165 ml  Output 1950 ml  Net 1215 ml    PULM  BLL rales  CV  RRR  GI  soft, NTND  VASC B groin inc c/d/i, drain R groin: 20 cc (serosang) overnight, biphasic PT, monophasic AT, somewhat cyanotic toes  NEURO R foot motor and sensation intact    Laboratory   CBC CBC Latest Ref Rng & Units 05/08/2018 04/30/2018 04/16/2018  WBC 4.0 - 10.5 K/uL 13.7(H) 6.8 -  Hemoglobin 13.0 - 17.0 g/dL 12.3(L) 13.4 12.6(L)  Hematocrit 39.0 - 52.0 % 35.9(L) 39.4 37.0(L)  Platelets 150 - 400 K/uL 241 267 -    BMET    Component Value Date/Time   NA 137 05/08/2018 0028   K 4.6 05/08/2018 0028   CL 102  05/08/2018 0028   CO2 24 05/08/2018 0028   GLUCOSE 222 (H) 05/08/2018 0028   GLUCOSE 97 01/02/2007 0000   BUN 11 05/08/2018 0028   CREATININE 1.00 05/08/2018 0028   CALCIUM 8.2 (L) 05/08/2018 0028   GFRNONAA >60 05/08/2018 0028   GFRAA >60 05/08/2018 0028     Adele Barthel, MD, FACS [facility] [place] Office: [phone] Pager: [phone]  05/08/2018, 7:17 AM

## 2018-05-08 NOTE — Anesthesia Postprocedure Evaluation (Signed)
Anesthesia Post Note  Patient: [name]  Procedure(s) Performed: ABDOMINAL AORTIC ENDOVASCULAR STENT GRAFT (N/A )     Patient location during evaluation: PACU Anesthesia Type: General Level of consciousness: awake and alert Pain management: pain level controlled Vital Signs Assessment: post-procedure vital signs reviewed and stable Respiratory status: spontaneous breathing, nonlabored ventilation, respiratory function stable and patient connected to nasal cannula oxygen Cardiovascular status: blood pressure returned to baseline and stable Postop Assessment: no apparent nausea or vomiting Anesthetic complications: no    Last Vitals:  Vitals:   05/08/18 0045 05/08/18 0100  BP: 115/62 114/64  Pulse: 67 65  Resp: 18 16  Temp: 36.7 C   SpO2: 98% 99%    Last Pain:  Vitals:   05/08/18 0045  TempSrc: Oral  PainSc: [place]                 Shylie Polo DAVID

## 2018-05-09 LAB — BASIC METABOLIC PANEL
Anion gap: 10 (ref 5–15)
BUN: 14 mg/dL (ref 6–20)
CO2: 25 mmol/L (ref 22–32)
Calcium: 8.3 mg/dL — ABNORMAL LOW (ref 8.9–10.3)
Chloride: 99 mmol/L — ABNORMAL LOW (ref 101–111)
Creatinine, Ser: 1.1 mg/dL (ref 0.61–1.24)
GFR calc Af Amer: >60 mL/min (ref >60)
GLUCOSE: 226 mg/dL — AB (ref 65–99)
Potassium: 4 mmol/L (ref 3.5–5.1)
Sodium: 134 mmol/L — ABNORMAL LOW (ref 135–145)

## 2018-05-09 LAB — TYPE AND SCREEN
ABO/RH(D): O POS
ANTIBODY SCREEN: NEGATIVE
UNIT DIVISION: 0
UNIT DIVISION: 0

## 2018-05-09 LAB — GLUCOSE, CAPILLARY
GLUCOSE-CAPILLARY: 186 mg/dL — AB (ref 65–99)
GLUCOSE-CAPILLARY: 123 mg/dL — AB (ref 65–99)
Glucose-Capillary: 222 mg/dL — ABNORMAL HIGH (ref 65–99)

## 2018-05-09 LAB — CBC
HEMATOCRIT: 36.8 % — AB (ref 39.0–52.0)
HEMOGLOBIN: 12.8 g/dL — AB (ref 13.0–17.0)
MCH: 29 pg (ref 26.0–34.0)
MCHC: 34.8 g/dL (ref 30.0–36.0)
MCV: 83.4 fL (ref 78.0–100.0)
Platelets: 209 10*3/uL (ref 150–400)
RBC: 4.41 MIL/uL (ref 4.22–5.81)
RDW: 13.5 % (ref 11.5–15.5)
WBC: 11.9 10*3/uL — ABNORMAL HIGH (ref 4.0–10.5)

## 2018-05-09 LAB — BPAM RBC
BLOOD PRODUCT EXPIRATION DATE: 201906112359
Blood Product Expiration Date: 201906112359
ISSUE DATE / TIME: 201905170352
ISSUE DATE / TIME: 201905170019
Unit Type and Rh: 5100
Unit Type and Rh: 5100

## 2018-05-09 MED ORDER — INSULIN ASPART 100 UNIT/ML ~~LOC~~ SOLN
0.0000 [IU] | Freq: Three times a day (TID) | SUBCUTANEOUS | Status: DC
  Administered 2018-05-09: 3 [IU] via SUBCUTANEOUS
  Administered 2018-05-09 – 2018-05-10 (×2): 2 [IU] via SUBCUTANEOUS

## 2018-05-09 NOTE — Plan of Care (Signed)
  Problem: Bowel/Gastric: Goal: Gastrointestinal status for postoperative course will improve Outcome: Progressing   Problem: Respiratory: Goal: Respiratory status will improve Outcome: Progressing   Problem: Tissue Perfusion: Goal: Adequacy of tissue perfusion will improve Outcome: Progressing   Problem: Coping: Goal: Level of anxiety will decrease Outcome: Progressing   Problem: Pain Managment: Goal: General experience of comfort will improve Outcome: Progressing   Problem: Education: Goal: Knowledge of the prescribed therapeutic regimen will improve Outcome: Completed/Met

## 2018-05-09 NOTE — Progress Notes (Addendum)
Progress Note    05/09/2018 10:50 AM 2 Days Post-Op  Subjective:  Pt states he did have some cramping in his calves and hips while walking prior to surgery.  He wants to go home  Tm 99.2 now afebrile HR 70's-90's NSR 270'J-500'X systolic 38% RA  Vitals:   05/09/18 0512 05/09/18 0815  BP: (!) 151/67 (!) 170/96  Pulse: 91 100  Resp: 15 (!) 22  Temp: 98.5 F (36.9 C) 98.5 F (36.9 C)  SpO2: 95% 95%    Physical Exam: Cardiac:  regular Lungs:  Non labored Incisions:  Right groin incision is clean and dry; left groin is soft without hematoma Extremities:  Pulses not palpable   CBC    Component Value Date/Time   WBC 13.7 (H) 05/08/2018 0028   RBC 4.29 05/08/2018 0028   HGB 12.8 (L) 05/08/2018 1139   HCT 37.8 (L) 05/08/2018 1139   PLT 241 05/08/2018 0028   MCV 83.7 05/08/2018 0028   MCH 28.7 05/08/2018 0028   MCHC 34.3 05/08/2018 0028   RDW 13.2 05/08/2018 0028   LYMPHSABS 3.5 01/28/2018 1624   MONOABS 0.6 01/28/2018 1624   EOSABS 0.1 01/28/2018 1624   BASOSABS 0.1 01/28/2018 1624    BMET    Component Value Date/Time   NA 137 05/08/2018 0028   K 4.6 05/08/2018 0028   CL 102 05/08/2018 0028   CO2 24 05/08/2018 0028   GLUCOSE 222 (H) 05/08/2018 0028   GLUCOSE 97 01/02/2007 0000   BUN 11 05/08/2018 0028   CREATININE 1.00 05/08/2018 0028   CALCIUM 8.2 (L) 05/08/2018 0028   GFRNONAA >60 05/08/2018 0028   GFRAA >60 05/08/2018 0028    INR    Component Value Date/Time   INR 1.14 05/08/2018 0028     Intake/Output Summary (Last 24 hours) at 05/09/2018 1050 Last data filed at 05/09/2018 0800 Gross per 24 hour  Intake 0 ml  Output 840 ml  Net -840 ml     RIGHT    LEFT    PRESSURE WAVEFORM  PRESSURE WAVEFORM  BRACHIAL 134 Triphasic BRACHIAL 158 Triphasic  DP 68 Monophasic DP 75 Monophasic  AT   AT    PT 75 Monophasic PT 67 Biphasic  PER   PER    GREAT TOE  Trivial dampened waveform GREAT TOE  Unable to identify discernable waveform      RIGHT LEFT  ABI 0.47 0.47     Assessment:  62 y.o. male is s/p:  1. Open exposure and repair of left common femoral artery  2. Endovascular aortic repair with main body with attached limb (Gore C3 31 mm x 14 mm x 17 cm) 3. Placement of iliac limb extension x 2 (Gore 12 mm x 14 cm, Gore 10 mm x 7 cm) And 1. Limited femoral endarterectomy with extended profundaplasty with bovine pericardial patch angioplasty 2. Open cannulation of profunda femoral artery  3. Right leg runoff 2 Days Post-Op  Plan: -pt doing well this am -will dc drain as it has 40cc total for 24 hrs -pt with ABI's of 0.45 bilaterally with SFA occlusions.  Feet do not feel much different that pre-op. -if he does well today, will discharge home tomorrow -DVT prophylaxis:  SQ heparin   Leontine Locket, PA-C [facility] [phone] 05/09/2018 10:50 AM   I have examined the patient, reviewed and agree with above.  Discussed with patient and wife present.  Known SFA occlusive disease with ankle arm index of 0.47 bilaterally.  No evidence of new ischemia.  Mobilize today and probable discharge in am  Curt Jews, MD 05/09/2018 11:52 AM

## 2018-05-09 NOTE — Plan of Care (Signed)
Care plans reviewed and patient is progressing.  

## 2018-05-09 NOTE — Plan of Care (Signed)
  Problem: Cardiac: Goal: Ability to maintain an adequate cardiac output will improve Outcome: Progressing   Problem: Clinical Measurements: Goal: Postoperative complications will be avoided or minimized Outcome: Progressing   Problem: Respiratory: Goal: Respiratory status will improve Outcome: Progressing   Problem: Education: Goal: Knowledge of General Education information will improve Outcome: Progressing   Problem: Pain Managment: Goal: General experience of comfort will improve Outcome: Progressing   Problem: Bowel/Gastric: Goal: Gastrointestinal status for postoperative course will improve Outcome: Completed/Met

## 2018-05-10 LAB — GLUCOSE, CAPILLARY
GLUCOSE-CAPILLARY: 158 mg/dL — AB (ref 65–99)
GLUCOSE-CAPILLARY: 137 mg/dL — AB (ref 65–99)
Glucose-Capillary: 143 mg/dL — ABNORMAL HIGH (ref 65–99)

## 2018-05-10 MED ORDER — OXYCODONE-ACETAMINOPHEN 5-325 MG PO TABS: 1.0000 | ORAL_TABLET | Freq: Four times a day (QID) | ORAL | 0 refills | Status: DC | PRN

## 2018-05-10 NOTE — Progress Notes (Signed)
[facility] Discharge Summary   [name] 04-05-1956 62 y.o. male  MRN: [id number]  Admission Date: 05/07/2018  Discharge Date: 05/10/18  Physician: Conrad Fetters [place], MD  Admission Diagnosis: ABDOMINAL AORTIC ANEURYSM   HPI:   This is a 62 y.o. male who presents with chief complaint: AAA and B CIA aneurysms.  The patient presents today for R IIA embolization.  Planned staged B IIA embolization: L IIA completed last month without complications.  Today, R IIA embolization planned.  Finally, EVAR with B EIA extension planned in another 2-3 weeks.Marland Kitchen    Hospital Course:  The patient was admitted to the hospital and taken to the operating room on 05/07/2018 and underwent: 1. Open exposure and repair of leftcommon femoral artery 2. Endovascular aortic repair with main body with attached limb (Gore C356mm x86mm x 17cm) 3. Placement of iliac limbextension x 2(Gore54mm x 14cm, Gore 10 mm x 7 cm)  Findings: 1. Successful exclusion of aneurysm at end of case  2.  Endoleaks: none 3. Bilateral patent renal arteries  4.  No internal iliac artery flow bilateral 5.  Poor opacification of left iliac limbs despite physiologic evidence of right common femoral artery perfusion 6. Distal pedal signals: Right posterior tibial artery monophasic, left posterior tibial artery biphasic  In the recovery room, unable to doppler signals right foot.  The pt was taken back to the OR and underwent: 1. Limited femoral endarterectomy with extended profundaplasty with bovine pericardial patch angioplasty 2. Open cannulation of profunda femoral artery  3. Right leg runoff  FINDING(S): 1.  Dissected right profunda femoral artery with extensive intimal damage 2.  Proglide sutures in proximal profunda femoral artery  3.  No thrombus in profunda femoral artery  4.  Backbleeding present from profunda femoral artery  5.  Chronically occluded superficial femoral artery  6.  Collaterals fill popliteal  artery at the knee  7.  All tibial arteries patent at proximal calf with diffuse disease 8.  Dopplerable R AT > PT at end of case  The pt tolerated the procedure well and was transported to the PACU in good condition.    By POD 1, his signals in the right foot were still worse than at the end of the case, but still in tact.  ABI's were obtained and were ~ .45% bilaterally.  On completion angiogram, there is no direct route between the femoral and tibial so I doubt embolization.  There is also diffuse tibial disease c/w diabetic status.  If R PFA is reoccluding, only remaining option is to attempt R EIA to CFA bypass.  Problem is the diseased R EIA and limited un-stented length remaining.  In regards to his neuropathic sx, pt is an diabetic with Hg A1c 7.3 (BG 222-357 while hosp).  He has history of sx that resolve with ambulation suggesting some spinal involvement also  By POD 2, he was doing well.  His drain was removed.  His feet did not feel much different than pre op.  Known SFA occlusive disease with ankle arm index of 0.47 bilaterally.  No evidence of new ischemia.  Mobilize today and probable discharge in am.  By POD 3, he was up and moving.  He did have some soreness in the groin, but that improved with mobilization.  He says his feet feel better today and less numbness.  His doppler signals were good PT bilaterally.  He does have a HgbA1c of 7.3 and is not on any oral medication.  I discussed  with the pt that he will need to make appointment with his PCP to evaluate his diabetes to get better control.  He expressed understanding and will make appointment.   The remainder of the hospital course consisted of increasing mobilization and increasing intake of solids without difficulty.  CBC    Component Value Date/Time   WBC 11.9 (H) 05/09/2018 1549   RBC 4.41 05/09/2018 1549   HGB 12.8 (L) 05/09/2018 1549   HCT 36.8 (L) 05/09/2018 1549   PLT 209 05/09/2018 1549   MCV 83.4 05/09/2018  1549   MCH 29.0 05/09/2018 1549   MCHC 34.8 05/09/2018 1549   RDW 13.5 05/09/2018 1549   LYMPHSABS 3.5 01/28/2018 1624   MONOABS 0.6 01/28/2018 1624   EOSABS 0.1 01/28/2018 1624   BASOSABS 0.1 01/28/2018 1624    BMET    Component Value Date/Time   NA 134 (L) 05/09/2018 1549   K 4.0 05/09/2018 1549   CL 99 (L) 05/09/2018 1549   CO2 25 05/09/2018 1549   GLUCOSE 226 (H) 05/09/2018 1549   GLUCOSE 97 01/02/2007 0000   BUN 14 05/09/2018 1549   CREATININE 1.10 05/09/2018 1549   CALCIUM 8.3 (L) 05/09/2018 1549   GFRNONAA >60 05/09/2018 1549   GFRAA >60 05/09/2018 1549         Discharge Diagnosis:  ABDOMINAL AORTIC ANEURYSM  Secondary Diagnosis: Patient Active Problem List   Diagnosis Date Noted  . Abdominal aneurysm ([place]) 05/07/2018  . Iliac artery occlusion ([facility]) 03/19/2018  . Aneurysm of iliac artery ([place]) 03/06/2018  . Acute MI, inferior wall ([place]) 01/21/2018  . Status post coronary artery stent placement   . Acute ST elevation myocardial infarction (STEMI) involving right coronary artery ([place]) 01/10/2018  . AAA (abdominal aortic aneurysm) without rupture ([place])   . Bilateral carotid artery stenosis 04/28/2017  . Dyslipidemia 04/28/2017  . Bilateral low back pain without sciatica 04/14/2017  . Cerumen impaction 11/25/2016  . Benign essential HTN 09/17/2016  . CAD (coronary artery disease), native coronary artery 09/17/2016  . Mood disorder ([place]) 11/29/2015  . Left leg swelling 05/15/2015  . COPD exacerbation ([place]) 12/15/2012  . Routine general medical examination at a health care facility 10/26/2012  . Atherosclerosis of native coronary artery with angina pectoris ([place]) 10/17/2011  . OSA (obstructive sleep apnea) 08/28/2011  . Obesity 08/27/2011  . Chronic systolic heart failure ([place]) 08/06/2011  . B12 DEFICIENCY 04/14/2009  . Tobacco use 02/16/2009  . CAROTID BRUIT 02/16/2009  . Diabetic polyneuropathy ([place]) 12/07/2008  . URINARY INCONTINENCE 05/27/2008  .  COPD (chronic obstructive pulmonary disease) with emphysema ([place]) 02/09/2008  . Hyperlipemia 10/13/2007  . Peripheral vascular disease in diabetes mellitus ([place]) 09/15/2007  . HEMORRHOIDS, EXTERNAL 09/15/2007  . DIVERTICULOSIS, COLON 09/15/2007  . COLONIC POLYPS, HX OF 09/15/2007  . Type 2 diabetes, controlled, with peripheral circulatory disorder ([place]) 05/25/2007  . GERD 05/25/2007  . REFLUX ESOPHAGITIS 04/23/2007   Past Medical History:  Diagnosis Date  . Arthritis   . Automatic implantable cardioverter-defibrillator in situ   . Benign essential HTN 09/17/2016  . COPD (chronic obstructive pulmonary disease) ([place])    emphysema by CXR  . Coronary artery disease     LAD a 60-70% stenosis, moderate size second diagonal with 50% stenosis, circumflex AV groove 75-80% stenosis with a branching obtuse marginal with a superior branch subtotal stenosis followed by diffuse disease, right coronary artery  had nonobstructive disease.  EF was 35%  . Diabetes mellitus    type 2  no  meds  . Diverticulosis of colon   . GERD (gastroesophageal reflux disease)    with esophagitis  . History of colonic polyps   . Hypertension   . Non-ischemic cardiomyopathy ([place])   . Presence of permanent cardiac pacemaker   . PVD (peripheral vascular disease) ([place])    bilateral common iliac artery aneurysms. Right SFA occlusion over a long segment. Left SFA disease with occlusion of the left TP trunk. Artirogram Oct. 2006  . Shortness of breath    exertion  . Sleep apnea   . Tobacco abuse   . Urinary incontinence    detrussor instability     Allergies as of 05/10/2018   No Known Allergies     Medication List    TAKE these medications   aspirin 81 MG tablet Take 81 mg by mouth daily.   atorvastatin 80 MG tablet Commonly known as:  LIPITOR Take 1 tablet (80 mg total) by mouth daily at 6 PM.   carvedilol 3.125 MG tablet Commonly known as:  COREG Take 1 tablet (3.125 mg total) by mouth 2 (two) times  daily with a meal.   ciprofloxacin 500 MG tablet Commonly known as:  CIPRO Take 1 tablet (500 mg total) by mouth 2 (two) times daily.   clopidogrel 75 MG tablet Commonly known as:  PLAVIX Take 1 tablet (75 mg total) by mouth daily.   losartan 25 MG tablet Commonly known as:  COZAAR Take 1 tablet (25 mg total) by mouth daily.   multivitamin per tablet Take 1 tablet by mouth daily.   nitroGLYCERIN 0.4 MG SL tablet Commonly known as:  NITROSTAT Place 1 tablet (0.4 mg total) under the tongue every 5 (five) minutes as needed. What changed:  reasons to take this   oxyCODONE-acetaminophen 5-325 MG tablet Commonly known as:  PERCOCET/ROXICET Take 1 tablet by mouth every 6 (six) hours as needed for moderate pain.   spironolactone 25 MG tablet Commonly known as:  ALDACTONE Take 0.5 tablets (12.5 mg total) by mouth daily.   vitamin B-12 1000 MCG tablet Commonly known as:  CYANOCOBALAMIN Take 1,000 mcg by mouth daily.       Discharge Instructions:  [facility]  Discharge Instructions Endovascular Aortic Aneurysm Repair  Please refer to the following instructions for your post-procedure care. Your surgeon or Physician Assistant will discuss any changes with you.  Activity  You are encouraged to walk as much as you can. You can slowly return to normal activities but must avoid strenuous activity and heavy lifting until your doctor tells you it's OK. Avoid activities such as vacuuming or swinging a gold club. It is normal to feel tired for several weeks after your surgery. Do not drive until your doctor gives the OK and you are no longer taking prescription pain medications. It is also normal to have difficulty with sleep habits, eating, and bowel movements after surgery. These will go away with time.  Bathing/Showering  You may shower after you go home. If you have an incision, do not soak in a bathtub, hot tub, or swim until the incision heals  completely.  Incision Care  Shower every day. Clean your incision with mild soap and water. Pat the area dry with a clean towel. You do not need a bandage unless otherwise instructed. Do not apply any ointments or creams to your incision. If you clothing is irritating, you may cover your incision with a dry gauze pad.  Diet  Resume your normal diet.  There are no special food restrictions following this procedure. A low fat/low cholesterol diet is recommended for all patients with vascular disease. In order to heal from your surgery, it is CRITICAL to get adequate nutrition. Your body requires vitamins, minerals, and protein. Vegetables are the best source of vitamins and minerals. Vegetables also provide the perfect balance of protein. Processed food has little nutritional value, so try to avoid this.  Medications  Resume taking all of your medications unless your doctor or Physician Assistnat tells you not to. If your incision is causing pain, you may take over-the-counter pain relievers such as acetaminophen (Tylenol). If you were prescribed a stronger pain medication, please be aware these medications can cause nausea and constipation. Prevent nausea by taking the medication with a snack or meal. Avoid constipation by drinking plenty of fluids and eating foods with a high amount of fiber, such as fruits, vegetables, and grains. Do not take Tylenol if you are taking prescription pain medications.   Follow up  [place] office will schedule a follow-up appointment with a C.T. scan 3-4 weeks after your surgery.  Please call us immediately for any of the following conditions  Severe or worsening pain in your legs or feet or in your abdomen back or chest. Increased pain, redness, drainage (pus) from your incision sit. Increased abdominal pain, bloating, nausea, vomiting or persistent diarrhea. Fever of 101 degrees or higher. Swelling in your leg (s),  Reduce your risk of vascular disease  .Stop  smoking. If you would like help call [facility] at 1-800-QUIT-NOW (947) 334-8428) or [place] at [phone]. .Manage your cholesterol .Maintain a desired weight .Control your diabetes .Keep your blood pressure down  If you have questions, please call the office at [phone].    Prescriptions given: Roxicet #12 No Refill  Disposition: home  Patient's condition: is Good  Follow up: 1. Dr. Bridgett Larsson in 4 weeks with CTA protocol 2. Dr. Silvio Pate to evaluate diabetes and get better control as hgbA1c is 7.3.   Leontine Locket, PA-C [facility] [phone] 05/10/2018  8:20 AM   - For VQI Registry use - Post-op:  Time to Extubation: [x]  In OR, [ ]  < 12 hrs, [ ]  12-24 hrs, [ ]  >=24 hrs Vasopressors Req. Post-op: No MI: No., [ ]  Troponin only, [ ]  EKG or Clinical New Arrhythmia: No CHF: No ICU Stay: 2 day in stepdown Transfusion: Yes     If yes, 1 units given  Complications: Resp failure: No., [ ]  Pneumonia, [ ]  Ventilator Chg in renal function: No., [ ]  Inc. Cr > 0.5, [ ]  Temp. Dialysis,  [ ]  Permanent dialysis Leg ischemia: Yes.  , no Surgery needed, [x ] Yes, Surgery needed,  [ ]  Amputation Bowel ischemia: No., [ ]  Medical Rx, [ ]  Surgical Rx Wound complication: No., [ ]  Superficial separation/infection, [ ]  Return to OR Return to OR: Yes  Return to OR for bleeding: No Stroke: No., [ ]  Minor, [ ]  Major  Discharge medications: Statin use:  Yes  ASA use:  Yes  Plavix use:  Yes  Beta blocker use:  Yes  ARB use:  Yes ACEI use:  No CCB use:  No

## 2018-05-10 NOTE — Discharge Instructions (Signed)
°[facility] [place]  ° °Discharge Instructions ° °Endovascular Aortic Aneurysm Repair ° °Please refer to the following instructions for your post-procedure care. Your surgeon or Physician Assistant will discuss any changes with you. ° °Activity ° °You are encouraged to walk as much as you can. You can slowly return to normal activities but must avoid strenuous activity and heavy lifting until your doctor tells you it's OK. Avoid activities such as vacuuming or swinging a gold club. It is normal to feel tired for several weeks after your surgery. Do not drive until your doctor gives the OK and you are no longer taking prescription pain medications. It is also normal to have difficulty with sleep habits, eating, and bowel movements after surgery. These will go away with time. ° °Bathing/Showering ° °Shower daily after you go home.  Do not soak in a bathtub, hot tub, or swim until the incision heals completely. ° °If you have incisions in your groin, wash the groin wounds with soap and water daily and pat dry. (No tub bath-only shower)  Then put a dry gauze or washcloth there to keep this area dry to help prevent wound infection daily and as needed.  Do not use Vaseline or neosporin on your incisions.  Only use soap and water on your incisions and then protect and keep dry. ° °Incision Care ° °Shower every day. Clean your incision with mild soap and water. Pat the area dry with a clean towel. You do not need a bandage unless otherwise instructed. Do not apply any ointments or creams to your incision. If you clothing is irritating, you may cover your incision with a dry gauze pad. ° °Wash the groin wound with soap and water daily and pat dry. (No tub bath-only shower)  Then put a dry gauze or washcloth there to keep this area dry daily and as needed.  Do not use Vaseline or neosporin on your incisions.  Only use soap and water on your incisions and then protect and keep dry. ° ° °Diet ° °Resume  your normal diet. There are no special food restrictions following this procedure. A low fat/low cholesterol diet is recommended for all patients with vascular disease. In order to heal from your surgery, it is CRITICAL to get adequate nutrition. Your body requires vitamins, minerals, and protein. Vegetables are the best source of vitamins and minerals. Vegetables also provide the perfect balance of protein. Processed food has little nutritional value, so try to avoid this. ° °Medications ° °Resume taking all of your medications unless your doctor or nurse practitioner tells you not to. If your incision is causing pain, you may take over-the-counter pain relievers such as acetaminophen (Tylenol). If you were prescribed a stronger pain medication, please be aware these medications can cause nausea and constipation. Prevent nausea by taking the medication with a snack or meal. Avoid constipation by drinking plenty of fluids and eating foods with a high amount of fiber, such as fruits, vegetables, and grains.  °Do not take Tylenol if you are taking prescription pain medications. ° ° °Follow up ° °Our office will schedule a follow-up appointment with a CT scan 3-4 weeks after your surgery. ° °Please call us immediately for any of the following conditions ° °• Severe or worsening pain in your legs or feet or in your abdomen back or chest. °• Increased pain, redness, drainage (pus) from your incision site. °• Increased abdominal pain, bloating, nausea, vomiting or persistent diarrhea. °• Fever of 101   degrees or higher. °• Swelling in your leg (s), °•  °Reduce your risk of vascular disease ° °•Stop smoking. If you would like help call [facility] at 1-800-QUIT-NOW (1-800-784-8669) or [place] at 336-586-4000. °•Manage your cholesterol °•Maintain a desired weight °•Control your diabetes °•Keep your blood pressure down ° °If you have questions, please call the office at [phone]. ° °

## 2018-05-10 NOTE — Plan of Care (Signed)
Care plans reviewed and patient is progressing.  

## 2018-05-10 NOTE — Progress Notes (Addendum)
  Progress Note    05/10/2018 8:03 AM 3 Days Post-Op  Subjective:  Says he is sore, but once up and moving it is better.  Says he is a little sore in his buttocks but he feels this was from laying on the table in OR.  Says his feet feel a little bit better today and numbness is better.   Tm 99 HR 70's-90's NSR 546'T-035'W systolic 65% RA  Vitals:   05/10/18 0501 05/10/18 0734  BP: 100/87 (!) 151/83  Pulse: 80 74  Resp: (!) 21 14  Temp: 98.3 F (36.8 C) 99 F (37.2 C)  SpO2: 96% 96%    Physical Exam: Cardiac:  regular Lungs:  Non labored Incisions:  Left groin incision with dry gauze; left groin is soft without hematoma. Extremities:  Good doppler signals bilateral PT   CBC    Component Value Date/Time   WBC 11.9 (H) 05/09/2018 1549   RBC 4.41 05/09/2018 1549   HGB 12.8 (L) 05/09/2018 1549   HCT 36.8 (L) 05/09/2018 1549   PLT 209 05/09/2018 1549   MCV 83.4 05/09/2018 1549   MCH 29.0 05/09/2018 1549   MCHC 34.8 05/09/2018 1549   RDW 13.5 05/09/2018 1549   LYMPHSABS 3.5 01/28/2018 1624   MONOABS 0.6 01/28/2018 1624   EOSABS 0.1 01/28/2018 1624   BASOSABS 0.1 01/28/2018 1624    BMET    Component Value Date/Time   NA 134 (L) 05/09/2018 1549   K 4.0 05/09/2018 1549   CL 99 (L) 05/09/2018 1549   CO2 25 05/09/2018 1549   GLUCOSE 226 (H) 05/09/2018 1549   GLUCOSE 97 01/02/2007 0000   BUN 14 05/09/2018 1549   CREATININE 1.10 05/09/2018 1549   CALCIUM 8.3 (L) 05/09/2018 1549   GFRNONAA >60 05/09/2018 1549   GFRAA >60 05/09/2018 1549    INR    Component Value Date/Time   INR 1.14 05/08/2018 0028     Intake/Output Summary (Last 24 hours) at 05/10/2018 0803 Last data filed at 05/09/2018 2342 Gross per 24 hour  Intake 480 ml  Output 725 ml  Net -245 ml     Assessment:  62 y.o. male is s/p:  1. Open exposure and repair of leftcommon femoral artery 2. Endovascular aortic repair with main body with attached limb (Gore C372mm x77mm x  17cm) 3. Placement of iliac limbextension x 2(Gore37mm x 14cm, Gore 10 mm x 7 cm) And 1. Limited femoral endarterectomy with extended profundaplasty with bovine pericardial patch angioplasty 2. Open cannulation ofprofunda femoral artery 3. Right leg runoff  3 Days Post-Op  Plan: -pt doing well this am; good doppler signals bilateral PT left>right and numbness improved. -still with soreness but this has also improved -DVT prophylaxis:  SQ heparin -discharge home today and f/u with Dr. Bridgett Larsson in 4 weeks with CTA -pt's hgbA1c is 7.3 he is not on any DM medications.  Discussed with pt that he needs to f/u with his PCP for his DM.  He says he will call and make an appointment.   Leontine Locket, PA-C [facility] [phone] 05/10/2018 8:03 AM  I have examined the patient, reviewed and agree with above.  Curt Jews, MD 05/10/2018 10:35 AM

## 2018-05-10 NOTE — Progress Notes (Signed)
[name] and his wife given discharge instructions.  Discussed new medications and prescriptions, medication changes and side effects.  Discussed follow up appointments and activities.  Discussed signs and symptoms to watch for and when to call the physician.  Verbalized understanding.

## 2018-05-10 NOTE — Plan of Care (Signed)
Patient is adequate for discharge.  

## 2018-05-11 ENCOUNTER — Telehealth: Payer: Self-pay | Admitting: Internal Medicine

## 2018-05-11 NOTE — Discharge Summary (Addendum)
EVAR Discharge Summary   ARLISS FRISINA 1956-07-31 62 y.o. male  MRN: [id number]  Admission Date: 05/07/2018  Discharge Date: 05/10/18  Physician: No att. providers found  Admission Diagnosis: ABDOMINAL AORTIC ANEURYSM   HPI:   This is a 62 y.o. male who presents with chief complaint: AAA and B CIA aneurysms.  The patient presents today for R IIA embolization.  Planned staged B IIA embolization: L IIA completed last month without complications.  Today, R IIA embolization planned.  Finally, EVAR with B EIA extension planned in another 2-3 weeks.Marland Kitchen    Hospital Course:  The patient was admitted to the hospital and taken to the operating room on 05/07/2018 and underwent: 1. Open exposure and repair of left common femoral artery  2. Endovascular aortic repair with main body with attached limb (Gore C3 31 mm x 14 mm x 17 cm) 3. Placement of iliac limb extension x 2 (Gore 12 mm x 14 cm, Gore 10 mm x 7 cm)  FINDING(S): 1. Successful exclusion of aneurysm at end of case  2.  Endoleaks: none 3. Bilateral patent renal arteries  4.  No internal iliac artery flow bilateral 5.  Poor opacification of left iliac limbs despite physiologic evidence of right common femoral artery perfusion 6. Distal pedal signals: Right posterior tibial artery monophasic, left posterior tibial artery biphasic    The pt tolerated the procedure well and was transported to the PACU in good condition.   In the recovery room, the right leg appeard unchanged from OR appearance.  Prior monophasic right PT signal is gone and patient is complaining of pain in R leg.  - Will emergently bring patient to OR to right femoral exploration and other necessary procedures - I discussed this with his family who agree to proceeding with the procedure.  He was taken back to the OR and underwent:  1. Limited femoral endarterectomy with extended profundaplasty with bovine pericardial patch angioplasty 2. Open cannulation of  profunda femoral artery  3. Right leg runoff  FINDING(S): 1.  Dissected right profunda femoral artery with extensive intimal damage 2.  Proglide sutures in proximal profunda femoral artery  3.  No thrombus in profunda femoral artery  4.  Backbleeding present from profunda femoral artery  5.  Chronically occluded superficial femoral artery  6.  Collaterals fill popliteal artery at the knee  7.  All tibial arteries patent at proximal calf with diffuse disease 8.  Dopplerable R AT > PT at end of case  On POD 1, the signals in his right foot were worse than at the end of the case, but still in tact.  ABI's were rechecked and ~ 0.47 bilaterally with known SFA occlusions.  On POD 2, pt was doing well. His feet did not feel much differently than pre op.  Dr. Donnetta Hutching discussed with pt about SFA occlusive disease with ankle arm index of 0.47 bilaterally.  No evidence of new ischemia.  Mobilize today and probable discharge in am.   By POD 3, he did have good doppler signals bilateral PT with left > right.  He stated his numbness had improved.  He was still having soreness, but this was improved as well.    Discussed with the pt that his HgbA1c was 7.3 and he is not on any medications.  Discussed with him the need to f/u with his PCP in the next week to evaluate and get better control.  He expresses understanding and will call to make an appointment.  The remainder of the hospital course consisted of increasing mobilization and increasing intake of solids without difficulty.  CBC    Component Value Date/Time   WBC 11.9 (H) 05/09/2018 1549   RBC 4.41 05/09/2018 1549   HGB 12.8 (L) 05/09/2018 1549   HCT 36.8 (L) 05/09/2018 1549   PLT 209 05/09/2018 1549   MCV 83.4 05/09/2018 1549   MCH 29.0 05/09/2018 1549   MCHC 34.8 05/09/2018 1549   RDW 13.5 05/09/2018 1549   LYMPHSABS 3.5 01/28/2018 1624   MONOABS 0.6 01/28/2018 1624   EOSABS 0.1 01/28/2018 1624   BASOSABS 0.1 01/28/2018 1624    BMET     Component Value Date/Time   NA 134 (L) 05/09/2018 1549   K 4.0 05/09/2018 1549   CL 99 (L) 05/09/2018 1549   CO2 25 05/09/2018 1549   GLUCOSE 226 (H) 05/09/2018 1549   GLUCOSE 97 01/02/2007 0000   BUN 14 05/09/2018 1549   CREATININE 1.10 05/09/2018 1549   CALCIUM 8.3 (L) 05/09/2018 1549   GFRNONAA >60 05/09/2018 1549   GFRAA >60 05/09/2018 1549       Discharge Instructions    Discharge patient   Complete by:  As directed    Discharge disposition:  01-Home or Self Care   Discharge patient date:  05/10/2018      Discharge Diagnosis:  ABDOMINAL AORTIC ANEURYSM  Secondary Diagnosis: Patient Active Problem List   Diagnosis Date Noted  . Abdominal aneurysm ([place]) 05/07/2018  . Iliac artery occlusion (HCC) 03/19/2018  . Aneurysm of iliac artery ([place]) 03/06/2018  . Acute MI, inferior wall ([place]) 01/21/2018  . Status post coronary artery stent placement   . Acute ST elevation myocardial infarction (STEMI) involving right coronary artery ([place]) 01/10/2018  . AAA (abdominal aortic aneurysm) without rupture ([place])   . Bilateral carotid artery stenosis 04/28/2017  . Dyslipidemia 04/28/2017  . Bilateral low back pain without sciatica 04/14/2017  . Cerumen impaction 11/25/2016  . Benign essential HTN 09/17/2016  . CAD (coronary artery disease), native coronary artery 09/17/2016  . Mood disorder ([place]) 11/29/2015  . Left leg swelling 05/15/2015  . COPD exacerbation ([place]) 12/15/2012  . Routine general medical examination at a health care facility 10/26/2012  . Atherosclerosis of native coronary artery with angina pectoris ([place]) 10/17/2011  . OSA (obstructive sleep apnea) 08/28/2011  . Obesity 08/27/2011  . Chronic systolic heart failure ([place]) 08/06/2011  . B12 DEFICIENCY 04/14/2009  . Tobacco use 02/16/2009  . CAROTID BRUIT 02/16/2009  . Diabetic polyneuropathy ([place]) 12/07/2008  . URINARY INCONTINENCE 05/27/2008  . COPD (chronic obstructive pulmonary disease) with emphysema ([place])  02/09/2008  . Hyperlipemia 10/13/2007  . Peripheral vascular disease in diabetes mellitus ([place]) 09/15/2007  . HEMORRHOIDS, EXTERNAL 09/15/2007  . DIVERTICULOSIS, COLON 09/15/2007  . COLONIC POLYPS, HX OF 09/15/2007  . Type 2 diabetes, controlled, with peripheral circulatory disorder ([place]) 05/25/2007  . GERD 05/25/2007  . REFLUX ESOPHAGITIS 04/23/2007   Past Medical History:  Diagnosis Date  . Arthritis   . Automatic implantable cardioverter-defibrillator in situ   . Benign essential HTN 09/17/2016  . COPD (chronic obstructive pulmonary disease) ([place])    emphysema by CXR  . Coronary artery disease     LAD a 60-70% stenosis, moderate size second diagonal with 50% stenosis, circumflex AV groove 75-80% stenosis with a branching obtuse marginal with a superior branch subtotal stenosis followed by diffuse disease, right coronary artery  had nonobstructive disease.  EF was 35%  . Diabetes mellitus    type  2  no meds  . Diverticulosis of colon   . GERD (gastroesophageal reflux disease)    with esophagitis  . History of colonic polyps   . Hypertension   . Non-ischemic cardiomyopathy ([place])   . Presence of permanent cardiac pacemaker   . PVD (peripheral vascular disease) ([place])    bilateral common iliac artery aneurysms. Right SFA occlusion over a long segment. Left SFA disease with occlusion of the left TP trunk. Artirogram Oct. 2006  . Shortness of breath    exertion  . Sleep apnea   . Tobacco abuse   . Urinary incontinence    detrussor instability     Allergies as of 05/10/2018   No Known Allergies     Medication List    TAKE these medications   aspirin 81 MG tablet Take 81 mg by mouth daily.   atorvastatin 80 MG tablet Commonly known as:  LIPITOR Take 1 tablet (80 mg total) by mouth daily at 6 PM.   carvedilol 3.125 MG tablet Commonly known as:  COREG Take 1 tablet (3.125 mg total) by mouth 2 (two) times daily with a meal.   ciprofloxacin 500 MG tablet Commonly known  as:  CIPRO Take 1 tablet (500 mg total) by mouth 2 (two) times daily.   clopidogrel 75 MG tablet Commonly known as:  PLAVIX Take 1 tablet (75 mg total) by mouth daily.   losartan 25 MG tablet Commonly known as:  COZAAR Take 1 tablet (25 mg total) by mouth daily.   multivitamin per tablet Take 1 tablet by mouth daily.   nitroGLYCERIN 0.4 MG SL tablet Commonly known as:  NITROSTAT Place 1 tablet (0.4 mg total) under the tongue every 5 (five) minutes as needed. What changed:  reasons to take this   oxyCODONE-acetaminophen 5-325 MG tablet Commonly known as:  PERCOCET/ROXICET Take 1 tablet by mouth every 6 (six) hours as needed for moderate pain.   spironolactone 25 MG tablet Commonly known as:  ALDACTONE Take 0.5 tablets (12.5 mg total) by mouth daily.   vitamin B-12 1000 MCG tablet Commonly known as:  CYANOCOBALAMIN Take 1,000 mcg by mouth daily.       Discharge Instructions:  [facility]  Discharge Instructions Endovascular Aortic Aneurysm Repair  Please refer to the following instructions for your post-procedure care. Your surgeon or Physician Assistant will discuss any changes with you.  Activity  You are encouraged to walk as much as you can. You can slowly return to normal activities but must avoid strenuous activity and heavy lifting until your doctor tells you it's OK. Avoid activities such as vacuuming or swinging a gold club. It is normal to feel tired for several weeks after your surgery. Do not drive until your doctor gives the OK and you are no longer taking prescription pain medications. It is also normal to have difficulty with sleep habits, eating, and bowel movements after surgery. These will go away with time.  Bathing/Showering  You may shower after you go home. If you have an incision, do not soak in a bathtub, hot tub, or swim until the incision heals completely.  Incision Care  Shower every day. Clean your incision  with mild soap and water. Pat the area dry with a clean towel. You do not need a bandage unless otherwise instructed. Do not apply any ointments or creams to your incision. If you clothing is irritating, you may cover your incision with a dry gauze pad.  Diet  Resume  your normal diet. There are no special food restrictions following this procedure. A low fat/low cholesterol diet is recommended for all patients with vascular disease. In order to heal from your surgery, it is CRITICAL to get adequate nutrition. Your body requires vitamins, minerals, and protein. Vegetables are the best source of vitamins and minerals. Vegetables also provide the perfect balance of protein. Processed food has little nutritional value, so try to avoid this.  Medications  Resume taking all of your medications unless your doctor or Physician Assistnat tells you not to. If your incision is causing pain, you may take over-the-counter pain relievers such as acetaminophen (Tylenol). If you were prescribed a stronger pain medication, please be aware these medications can cause nausea and constipation. Prevent nausea by taking the medication with a snack or meal. Avoid constipation by drinking plenty of fluids and eating foods with a high amount of fiber, such as fruits, vegetables, and grains. Do not take Tylenol if you are taking prescription pain medications.   Follow up  [facility] office will schedule a follow-up appointment with a C.T. scan 3-4 weeks after your surgery.  Please call us immediately for any of the following conditions  Severe or worsening pain in your legs or feet or in your abdomen back or chest. Increased pain, redness, drainage (pus) from your incision sit. Increased abdominal pain, bloating, nausea, vomiting or persistent diarrhea. Fever of 101 degrees or higher. Swelling in your leg (s),  Reduce your risk of vascular disease  .Stop smoking. If you would like help call [facility] at 1-800-QUIT-NOW  [phone]) or [place] at [phone]. .Manage your cholesterol .Maintain a desired weight .Control your diabetes .Keep your blood pressure down  If you have questions, please call the office at [phone].    Prescriptions given: Roxicet #12 No Refill  Disposition: home  Patient's condition: is Good  Follow up: 1. Dr. Bridgett Larsson in 4 weeks with CTA protocol 2. Dr. Silvio Pate in the next week to evaluate DM for hgbA1c of 7.3   Leontine Locket, [facility] [phone] 05/11/2018  10:57 AM   Addendum  SHAAN RHOADS is a 62 y.o. (1955-12-27) male with moderate sized abdominal aortic aneurysm and large bilateral common iliac artery aneurysms.  He underwent staged internal iliac artery embolization as it was felt that placement of a iliac branch device was not possible due to internal iliac artery orifice stenoses.    The patient underwent a moderately difficult EVAR noteable for difficulty getting into left femoral artery, leading to cutdown.  His EVAR was completed with endoleak and preservation of both renal arteries.  The extension into the external iliac artery prevented any flow into the internal iliac artery which had been embolized previously.  On the table, it was difficult to visualize flow into the left iliac limb despite obvious evidence of patency clinically.  Post-operatively, the patient lost signals in his right leg.  He was emergently brought back to the operating room.  Intimal disruption in the hypertrophied profunda femoral artery was noted.  This was repaired with a limited endarterectomy, tacking down the flaps and bovine patch angioplasty.  The completion images demonstrated widely patent profunda femoral artery post-repair with chronic collateral in at-the-knee popliteal artery with chronic flush occlusion of right superficial femoral artery.  Post-operatively, this patient was observed from any bleeding from the fragile profunda femoral  artery with a drain.  This was eventually removed on POD #2.  The patient began ambulating well and discharged on POD #  3.  The patient will follow up in the office in 4 weeks with a CTA abd/pelvis.  Adele Barthel, MD, FACS [facility] Office: [phone] Pager: [phone]  05/13/2018, 8:34 AM      - For VQI Registry use - Post-op:  Time to Extubation: [x]  In OR, [ ]  < 12 hrs, [ ]  12-24 hrs, [ ]  >=24 hrs Vasopressors Req. Post-op: No MI: No., [ ]  Troponin only, [ ]  EKG or Clinical New Arrhythmia: No CHF: No ICU Stay: 3 day in stepdown Transfusion: Yes     If yes, 1 unit given  Complications: Resp failure: No., [ ]  Pneumonia, [ ]  Ventilator Chg in renal function: No., [ ]  Inc. Cr > 0.5, [ ]  Temp. Dialysis,  [ ]  Permanent dialysis Leg ischemia: Yes no Surgery needed, [x ] Yes, Surgery needed,  [ ]  Amputation Bowel ischemia: No., [ ]  Medical Rx, [ ]  Surgical Rx Wound complication: No., [ ]  Superficial separation/infection, [ ]  Return to OR Return to OR: Yes  Return to OR for bleeding: No Stroke: No., [ ]  Minor, [ ]  Major  Discharge medications: Statin use:  Yes  ASA use:  Yes  Plavix use:  Yes  Beta blocker use:  Yes  ARB use:  Yes ACEI use:  No CCB use:  No

## 2018-05-11 NOTE — Telephone Encounter (Signed)
Any 15 mins slot (even same day) in the next 1-2 weeks is fine. It was not a horrible level.

## 2018-05-11 NOTE — Telephone Encounter (Signed)
See below crm.  Ok to work in if so when  Copied from [place]. Topic: Appointment Scheduling - Scheduling Inquiry for Clinic >> May 11, 2018 12:38 PM Conception Chancy, NT wrote: Reason for CRM: patient wife is calling and trying to schedule patient a hospital follow up. He was discharged on 05/10/18. Patient had surgery and was told that his A1C levels were elevated and would need to follow up with Dr. Silvio Pate. Please contact patient to schedule as the schedule is full.

## 2018-05-11 NOTE — Telephone Encounter (Signed)
Appointment 5/21

## 2018-05-12 ENCOUNTER — Encounter: Payer: Self-pay | Admitting: Internal Medicine

## 2018-05-12 ENCOUNTER — Ambulatory Visit (INDEPENDENT_AMBULATORY_CARE_PROVIDER_SITE_OTHER): Payer: BLUE CROSS/BLUE SHIELD | Admitting: Internal Medicine

## 2018-05-12 VITALS — BP 114/70 | HR 74 | Temp 97.9°F | Ht 69.0 in | Wt 276.0 lb

## 2018-05-12 DIAGNOSIS — E1151 Type 2 diabetes mellitus with diabetic peripheral angiopathy without gangrene: Secondary | ICD-10-CM | POA: Diagnosis not present

## 2018-05-12 DIAGNOSIS — I714 Abdominal aortic aneurysm, without rupture, unspecified: Secondary | ICD-10-CM

## 2018-05-12 DIAGNOSIS — I2111 ST elevation (STEMI) myocardial infarction involving right coronary artery: Secondary | ICD-10-CM

## 2018-05-12 MED ORDER — METFORMIN HCL ER 500 MG PO TB24: 1000.0000 mg | ORAL_TABLET | Freq: Every day | ORAL | 11 refills | Status: DC

## 2018-05-12 NOTE — Progress Notes (Signed)
Subjective:    Patient ID: [name], male    DOB: [birth year], 62 y.o.   MRN: [id number]  HPI Here for hospital follow up Here with wife Had stent in aorta and embolization of iliac arteries Had to go back and repair artery that was damaged in procedure Due for follow up CT scan and vascular follow up later in the month  Had to have incision in femoral area Bleeding has stopped--but still bruised and painful  Follow up here due to elevated sugars Got insulin in the hospital Now checking regularly again 139 today Never over 170 Recent A1c up to 7.3% Has not been on any Rx before  Current Outpatient Medications on File Prior to Visit  Medication Sig Dispense Refill  . aspirin 81 MG tablet Take 81 mg by mouth daily.     Marland Kitchen atorvastatin (LIPITOR) 80 MG tablet Take 1 tablet (80 mg total) by mouth daily at 6 PM. 90 tablet 3  . carvedilol (COREG) 3.125 MG tablet Take 1 tablet (3.125 mg total) by mouth 2 (two) times daily with a meal. 180 tablet 3  . clopidogrel (PLAVIX) 75 MG tablet Take 1 tablet (75 mg total) by mouth daily. 30 tablet 11  . losartan (COZAAR) 25 MG tablet Take 1 tablet (25 mg total) by mouth daily. 90 tablet 3  . multivitamin (THERAGRAN) per tablet Take 1 tablet by mouth daily.     . nitroGLYCERIN (NITROSTAT) 0.4 MG SL tablet Place 1 tablet (0.4 mg total) under the tongue every 5 (five) minutes as needed. (Patient taking differently: Place 0.4 mg under the tongue every 5 (five) minutes as needed for chest pain. ) 25 tablet 2  . oxyCODONE-acetaminophen (PERCOCET/ROXICET) 5-325 MG tablet Take 1 tablet by mouth every 6 (six) hours as needed for moderate pain. 12 tablet 0  . spironolactone (ALDACTONE) 25 MG tablet Take 0.5 tablets (12.5 mg total) by mouth daily. 45 tablet 3  . vitamin B-12 (CYANOCOBALAMIN) 1000 MCG tablet Take 1,000 mcg by mouth daily.      No current facility-administered medications on file prior to visit.     No Known Allergies  Past Medical  History:  Diagnosis Date  . Arthritis   . Automatic implantable cardioverter-defibrillator in situ   . Benign essential HTN 09/17/2016  . COPD (chronic obstructive pulmonary disease) ([place])    emphysema by CXR  . Coronary artery disease     LAD a 60-70% stenosis, moderate size second diagonal with 50% stenosis, circumflex AV groove 75-80% stenosis with a branching obtuse marginal with a superior branch subtotal stenosis followed by diffuse disease, right coronary artery  had nonobstructive disease.  EF was 35%  . Diabetes mellitus    type 2  no meds  . Diverticulosis of colon   . GERD (gastroesophageal reflux disease)    with esophagitis  . History of colonic polyps   . Hypertension   . Non-ischemic cardiomyopathy ([place])   . Presence of permanent cardiac pacemaker   . PVD (peripheral vascular disease) ([place])    bilateral common iliac artery aneurysms. Right SFA occlusion over a long segment. Left SFA disease with occlusion of the left TP trunk. Artirogram Oct. 2006  . Shortness of breath    exertion  . Sleep apnea   . Tobacco abuse   . Urinary incontinence    detrussor instability    Past Surgical History:  Procedure Laterality Date  . ABDOMINAL AORTIC ENDOVASCULAR STENT GRAFT N/A 05/07/2018   Procedure: ABDOMINAL AORTIC  ENDOVASCULAR STENT GRAFT;  Surgeon: Conrad Rockwall, MD;  Location: [facility];  Service: Vascular;  Laterality: N/A;  . CARDIAC CATHETERIZATION  06/2004   negative  . COLONOSCOPY  04/2005  . COPD exacerbation    . CORONARY ANGIOPLASTY  01/09/2018  . CORONARY STENT INTERVENTION N/A 01/10/2018   Procedure: CORONARY STENT INTERVENTION;  Surgeon: Lorretta Harp, MD;  Location: [facility];  Service: Cardiovascular;  Laterality: N/A;  . CORONARY/GRAFT ACUTE MI REVASCULARIZATION N/A 01/10/2018   Procedure: Coronary/Graft Acute MI Revascularization;  Surgeon: Lorretta Harp, MD;  Location: [facility];  Service: Cardiovascular;  Laterality: N/A;  .  EMBOLIZATION Left 03/19/2018  . EMBOLIZATION Left 03/19/2018   Procedure: EMBOLIZATION - Left Internal;  Surgeon: Conrad [place], MD;  Location: [facility];  Service: Cardiovascular;  Laterality: Left;  . EMBOLIZATION Right 04/16/2018   Procedure: EMBOLIZATION;  Surgeon: Conrad [place], MD;  Location: [facility];  Service: Cardiovascular;  Laterality: Right;  . FEMORAL ARTERY EXPLORATION Right 05/07/2018   Procedure: FEMORAL ARTERY EXPLORATION, EXTENDED PROFUNDAPLASTY;  Surgeon: Conrad Conway, MD;  Location: [facility];  Service: Vascular;  Laterality: Right;  . INTRAOPERATIVE ARTERIOGRAM Right 05/07/2018   Procedure: INTRA OPERATIVE ARTERIOGRAM;  Surgeon: Conrad Edgefield, MD;  Location: [facility];  Service: Vascular;  Laterality: Right;  . LEFT HEART CATH AND CORONARY ANGIOGRAPHY N/A 01/10/2018   Procedure: LEFT HEART CATH AND CORONARY ANGIOGRAPHY;  Surgeon: Lorretta Harp, MD;  Location: [facility];  Service: Cardiovascular;  Laterality: N/A;  . PACEMAKER INSERTION  11/2005  . PACEMAKER LEAD REMOVAL N/A 10/17/2014   Procedure: PACEMAKER LEAD REMOVAL;  Surgeon: Evans Lance, MD;  Location: [facility];  Service: Cardiovascular;  Laterality: N/A;  . RENAL ANGIOGRAPHY Left 04/16/2018   Procedure: RENAL ANGIOGRAPHY;  Surgeon: Conrad Chesterfield, MD;  Location: [facility];  Service: Cardiovascular;  Laterality: Left;  . s/p ICD placement      [employer] Maximo [phone] single chamber    Family History  Problem Relation Age of Onset  . Stroke Father   . Coronary artery disease Maternal Aunt   . Heart failure Maternal Aunt   . Lung cancer Maternal Aunt   . Lung cancer Maternal Uncle   . Cancer Brother        Mouth  . Cancer Sister        throat    Social History   Socioeconomic History  . Marital status: Married    Spouse name: Not on file  . Number of children: 2  . Years of education: Not on file  . Highest education level: Not on file  Occupational History  . Occupation:  Grave digger--now disabled  Social Needs  . Financial resource strain: Not on file  . Food insecurity:    Worry: Not on file    Inability: Not on file  . Transportation needs:    Medical: Not on file    Non-medical: Not on file  Tobacco Use  . Smoking status: Former Smoker    Packs/day: 2.00    Years: 15.00    Pack years: 30.00    Types: Cigarettes    Last attempt to quit: 01/09/2018    Years since quitting: 0.3  . Smokeless tobacco: Never Used  . Tobacco comment: GAVE 1-800-QUIT-NOW  Substance and Sexual Activity  . Alcohol use: No  . Drug use: No  . Sexual activity: Never  Lifestyle  . Physical activity:  Days per week: Not on file    Minutes per session: Not on file  . Stress: Not on file  Relationships  . Social connections:    Talks on phone: Not on file    Gets together: Not on file    Attends religious service: Not on file    Active member of club or organization: Not on file    Attends meetings of clubs or organizations: Not on file    Relationship status: Not on file  . Intimate partner violence:    Fear of current or ex partner: Not on file    Emotionally abused: Not on file    Physically abused: Not on file    Forced sexual activity: Not on file  Other Topics Concern  . Not on file  Social History Narrative   No living will   Requests wife as health care POA   Would accept resuscitation but doesn't want prolonged ventilation   No tube feeds if cognitively unaware   Review of Systems Weight is up from MI in January Not sleeping well--nothing new Feels better since the procedure--drinking more water     Objective:   Physical Exam  Constitutional: He appears well-developed. No distress.  Neck: No thyromegaly present.  Cardiovascular: Normal rate, regular rhythm and normal heart sounds. Exam reveals no gallop and no friction rub.  Very faint or absent pedal pulses  Pulmonary/Chest: Effort normal and breath sounds normal. No respiratory distress. He  has no wheezes. He has no rales.  Abdominal:  Right femoral site healing No sig bruising and no hematoma  Lymphadenopathy:    He has no cervical adenopathy.  Psychiatric: He has a normal mood and affect. His behavior is normal.          Assessment & Plan:

## 2018-05-12 NOTE — Assessment & Plan Note (Signed)
Did okay with the procedure--but needed second procedure to repair artery damage Doing well now Has vascular follow up scheduled

## 2018-05-12 NOTE — Assessment & Plan Note (Signed)
A1c up considerably but still not bad Will start metformin

## 2018-05-12 NOTE — Patient Instructions (Signed)
Please start the metformin with 1 tab with breakfast daily. If no diarrhea or abdominal problems after a week, increase to 2 tabs daily.

## 2018-05-20 ENCOUNTER — Telehealth: Payer: Self-pay | Admitting: Vascular Surgery

## 2018-05-20 ENCOUNTER — Other Ambulatory Visit: Payer: Self-pay

## 2018-05-20 DIAGNOSIS — I714 Abdominal aortic aneurysm, without rupture, unspecified: Secondary | ICD-10-CM

## 2018-05-20 DIAGNOSIS — Z48812 Encounter for surgical aftercare following surgery on the circulatory system: Secondary | ICD-10-CM

## 2018-05-20 NOTE — Telephone Encounter (Signed)
sch appt spk to pt wife 06/17/18 10am CT 1145am P/o MD s/p EVAR per stf msg

## 2018-05-21 ENCOUNTER — Telehealth: Payer: Self-pay | Admitting: *Deleted

## 2018-05-21 NOTE — Telephone Encounter (Signed)
Call from patient's wife states patient has a small opening at groin site with small amount "white pus" drainage. Keeping area clean, covered and change dressing when needed. Denies any fever or chills, redness or puffiness at site. Appt given to see PA.

## 2018-05-22 ENCOUNTER — Other Ambulatory Visit: Payer: Self-pay

## 2018-05-22 ENCOUNTER — Ambulatory Visit (INDEPENDENT_AMBULATORY_CARE_PROVIDER_SITE_OTHER): Payer: Self-pay | Admitting: Physician Assistant

## 2018-05-22 ENCOUNTER — Encounter: Payer: Self-pay | Admitting: Physician Assistant

## 2018-05-22 VITALS — BP 152/91 | HR 73 | Temp 97.8°F | Resp 20 | Ht 69.0 in | Wt 269.0 lb

## 2018-05-22 DIAGNOSIS — I714 Abdominal aortic aneurysm, without rupture, unspecified: Secondary | ICD-10-CM

## 2018-05-22 MED ORDER — CEPHALEXIN 500 MG PO CAPS: 500.0000 mg | ORAL_CAPSULE | Freq: Two times a day (BID) | ORAL | 0 refills | Status: DC

## 2018-05-22 NOTE — Progress Notes (Signed)
    Post-operative EVAR   History of Present Illness   [name] is a 62 y.o. male who presents post-op s/p endovascular repair of AAA and B CIA aneurysms with L CFA cutdown and subsequent emergent return to OR for repair of R CFA and profunda by Dr. Bridgett Larsson and Dr. Scot Dock 05/07/18.  He returns to clinic today due to concerns about surgical incision.  Patient's wife has noticed some separation of skin edges of R groin incision in the past 2 days.  She states there has only been minimal thin yellow tinged drainage but not enough to soak through underwear.  Patient denies claudication, rest pain or non healing wounds BLE.  Also denies fevers, chills, N/V.    Past Medical History, Past Surgical History, Social History, Family History, Medications, Allergies, and Review of Systems are unchanged from previous evaluation pre-operatively..   For VQI Use Only   PRE-ADM LIVING: Home  AMB STATUS: Ambulatory   Physical Examination   Vitals:   05/22/18 1311  Weight: 269 lb (122 kg)  Height: 5\' 9"  (1.753 m)    Vascular Vessel Right Left  Aorta Not palpable N/A  Femoral Palpable Palpable        Gastrointestinal Well healed L groin incision; skin layer dehiscence of R groin mid to proximal aspect of incision; no purulence or surrounding erythema; monophasic R AT and PT, multiphasic L PT, monophasic L AT soft, non-distended, non-tender to palpation,         Medical Decision Making   FREAD KOTTKE is a 61 y.o. male who presents s/p  endovascular repair of AAA and B CIA aneurysms with L CFA cutdown and subsequent emergent return to OR for repair of R CFA and profunda with bovine patch by Dr. Bridgett Larsson and Dr. Scot Dock 05/07/18.     Skin layer dehiscence R groin incision without tunneling, no exposure of bovine patch  Recommended washing incision with soap and water BID and patting dry; keep dry gauze and change as needed throughout the day to wick moisture  500mg  Keflex BID for 7 days  called into [facility] in [place]  Patient will follow up with Dr. Bridgett Larsson this coming Wednesday 05/27/18 or the following Wednesday 06/03/18  We will also try to reschedule CTA abd/pelvis the same day to save patient a trip  Patient and wife will call or return to office sooner with worsening of R groin incision or any other concerns   Dagoberto Ligas, PA-C [facility] Office: [phone]

## 2018-05-25 ENCOUNTER — Inpatient Hospital Stay: Admission: RE | Admit: 2018-05-25 | Payer: BLUE CROSS/BLUE SHIELD | Source: Ambulatory Visit

## 2018-05-28 NOTE — Progress Notes (Deleted)
    Post-operative EVAR   History of Present Illness   [name] is a 61 y.o. male who presents post-op s/p EVAR (05/08/18).  This procedure was complicated by a R PFA intimal dissection that required limited endarterectomy and patch angioplasty (05/08/18).  These procedures subsequent required bilateral femoral exposure.    The patient has *** had back or abdominal pain.  The patient had some drainage from R groin and was started on Abx.  Past Medical History, Past Surgical History, Social History, Family History, Medications, Allergies, and Review of Systems were reviewed on 06/01/18 and was unchanged from previous evaluation on 05/22/18.   For VQI Use Only   PRE-ADM LIVING: {VQI Pre-admission Living:20973}  AMB STATUS: {VQI Ambulatory Status:20974}   Physical Examination  ***There were no vitals filed for this visit.  Vascular Vessel Right Left  Aorta Not palpable N/A  Femoral {Palpable:19197::"Not palpable","Faintly palpable","Palpable"} {Palpable:19197::"Not palpable","Faintly palpable","Palpable"}    Gastrointestinal soft, {Distension:19197::"distended","non-distended"}, {TTP:19197::"TTP in *** quadrant","appropriate tenderness to palpation","non-tender to palpation"}, {Guarding:19197::"Guarding and rebound in *** quadrant","No guarding or rebound"}, {HSM:19197::"HSM present","no HSM"}, {Masses:19197::"Mass present: ***","no masses"}, {Flank:19197::"CVAT on ***","Flank bruit present on ***","no CVAT B"}, {AAA:19197::"Palpable prominent aortic pulse","No palpable prominent aortic pulse"}, {Surgical incision:19197::"Surg incisions: ***","Surgical incisions well healed"," "}     Radiology   CTA Abd & Pelvis (***)  AAA sac size: *** cm x *** cm  *** endoleak detected   Medical Decision Making   [name] is a 61 y.o. male who presents s/p EVAR, limited R PFA EA w/ BPA.     Pt is ***symptomatic with *** sac size.  I discussed with the patient the importance of  surveillance of the endograft.  The next endograft duplex will be scheduled for *** months.  The next CTA will be scheduled for *** months.  The patient will follow up at the above intervals.  Thank you for allowing [place] to participate in this patient's care.   Adele Barthel, MD, FACS [facility] Office: [phone] Pager: [phone]

## 2018-05-29 ENCOUNTER — Ambulatory Visit: Admission: RE | Admit: 2018-05-29 | Payer: BLUE CROSS/BLUE SHIELD | Source: Ambulatory Visit

## 2018-06-01 ENCOUNTER — Encounter: Payer: BLUE CROSS/BLUE SHIELD | Admitting: Vascular Surgery

## 2018-06-01 ENCOUNTER — Other Ambulatory Visit: Payer: Self-pay

## 2018-06-01 DIAGNOSIS — I713 Abdominal aortic aneurysm, ruptured, unspecified: Secondary | ICD-10-CM

## 2018-06-09 ENCOUNTER — Ambulatory Visit (HOSPITAL_COMMUNITY)
Admission: RE | Admit: 2018-06-09 | Discharge: 2018-06-09 | Disposition: A | Discharge status: Home / Self Care | Payer: BLUE CROSS/BLUE SHIELD | Source: Ambulatory Visit | Attending: Vascular Surgery | Admitting: Vascular Surgery

## 2018-06-09 DIAGNOSIS — I771 Stricture of artery: Secondary | ICD-10-CM | POA: Insufficient documentation

## 2018-06-09 DIAGNOSIS — I713 Abdominal aortic aneurysm, ruptured, unspecified: Secondary | ICD-10-CM

## 2018-06-09 DIAGNOSIS — Z95828 Presence of other vascular implants and grafts: Secondary | ICD-10-CM | POA: Diagnosis not present

## 2018-06-09 MED ORDER — IOPAMIDOL (ISOVUE-370) INJECTION 76%
INTRAVENOUS | Status: AC
  Administered 2018-06-09: 100 mL
  Filled 2018-06-09: qty 100

## 2018-06-10 ENCOUNTER — Encounter: Payer: Self-pay | Admitting: Vascular Surgery

## 2018-06-10 ENCOUNTER — Other Ambulatory Visit: Payer: Self-pay

## 2018-06-10 ENCOUNTER — Ambulatory Visit (INDEPENDENT_AMBULATORY_CARE_PROVIDER_SITE_OTHER): Payer: Self-pay | Admitting: Vascular Surgery

## 2018-06-10 VITALS — BP 134/80 | HR 76 | Temp 97.4°F | Resp 15 | Ht 69.0 in | Wt 272.2 lb

## 2018-06-10 DIAGNOSIS — I714 Abdominal aortic aneurysm, without rupture, unspecified: Secondary | ICD-10-CM

## 2018-06-10 DIAGNOSIS — I723 Aneurysm of iliac artery: Secondary | ICD-10-CM

## 2018-06-10 MED ORDER — FLUCONAZOLE 150 MG PO TABS: 150.0000 mg | ORAL_TABLET | Freq: Every day | ORAL | 0 refills | Status: DC

## 2018-06-10 NOTE — Progress Notes (Signed)
    Post-operative EVAR   History of Present Illness   [name] is a 62 y.o. male who presents post-op s/p EVAR, repair of R CFA and PFA w/ BPA (05/22/18).  Most recent CTA (06/09/18) demonstrates: no endoleak and stable sac size.  The patient has not had back or abdominal pain.  R groin wound is healing,  L groin has healed but has a rash.  For VQI Use Only   PRE-ADM LIVING: Home  AMB STATUS: Ambulatory   Physical Examination   Vitals:   06/10/18 1151 06/10/18 1154  BP: (!) 145/84 134/80  Pulse: 76   Resp: 15   Temp: (!) 97.4 F (36.3 C)   TempSrc: Oral   SpO2: 95%   Weight: 272 lb 3.2 oz (123.5 kg)   Height: 5\' 9"  (1.753 m)     Vascular Vessel Right Left  Aorta Not palpable N/A  Femoral Palpable Palpable    Gastrointestinal soft, non-distended, non-tender to palpation, No guarding or rebound, no HSM, no masses, no CVAT B, No palpable prominent aortic pulse,     Vascular (cont) Right groin: with minimal fat evident, good granulation tissue, L groin healed with recurrent superficial fungal infection     Radiology   CTA Abd & Pelvis (06/09/18): unread  Based on my review of the CTA:  Immediately Infrarenal position of main body   No limb dysfunction  Excluded AAA and B CIA aneurysms  B IIA collateral flow evident  Some R EIA stenosis evident with intact R CFA to PFA flow  Distal thigh inadequately imaged due to cutoff  L CFA to PFA flow evident  B SFA occlusion  Medical Decision Making   [name] is a 62 y.o. male who presents s/p EVAR of AAA and B CIA aneurysms with L CFA cutdown and subsequent emergent return to OR for repair of R CFA and PFA with bovine patch (05/07/18).  Recurrent candidiasis L groin   Continue wet-to-dry dressing to R groin daily.  Should be healed in 2-3 weeks.  Follow up in 3 weeks for wound check  To avoid seeding the graft, I am giving 150 mg of Diflucan to eradicate this patient's candidiasis.  I made the patient  aware of the risk of idiosyncratic drug reaction with diflucan, leading to acute liver failure.  Thank you for allowing [place] to participate in this patient's care.   Adele Barthel, MD, FACS [facility] Office: [phone] Pager: [phone]

## 2018-06-17 ENCOUNTER — Other Ambulatory Visit: Payer: BLUE CROSS/BLUE SHIELD

## 2018-06-17 ENCOUNTER — Encounter: Payer: BLUE CROSS/BLUE SHIELD | Admitting: Vascular Surgery

## 2018-06-24 ENCOUNTER — Other Ambulatory Visit: Payer: Self-pay | Admitting: Internal Medicine

## 2018-06-24 ENCOUNTER — Encounter: Payer: Self-pay | Admitting: Internal Medicine

## 2018-06-25 MED ORDER — METHOCARBAMOL 500 MG PO TABS: 500.0000 mg | ORAL_TABLET | Freq: Three times a day (TID) | ORAL | 0 refills | Status: DC | PRN

## 2018-07-07 NOTE — Progress Notes (Signed)
    Post-operative EVAR   History of Present Illness   [name] is a 62 y.o. ([birth year]) male who presents post-op s/p EVAR, repair of R CFA and PFA w/ BPA (05/22/18).  Most recent CTA (06/09/18) demonstrates: no endoleak and stable sac size.  The patient has not had back or abdominal pain.  R groin wound is nearly healed,  L groin has healed.  B groins still have rashes  Physical Examination   Vitals:   07/10/18 0832 07/10/18 0833  BP: (!) 167/93 (!) 169/90  Pulse: 76   Resp: 20   Temp: 98.4 F (36.9 C)   TempSrc: Oral   SpO2: 95%   Weight: 274 lb 9.6 oz (124.6 kg)   Height: 5\' 9"  (1.753 m)      Vascular Vessel Right Left  Aorta Not palpable N/A  Femoral Palpable Palpable    Gastrointestinal soft, non-distended, non-tender to palpation, No guarding or rebound, no HSM, no masses, no CVAT B, No palpable prominent aortic pulse,     Vascular (cont) Right groin: superficial opening, nearly healed , L groin: healed, B erythematous rash consistent with fungal infection    Medical Decision Making   [name] is a 62 y.o. (June 04, 1956) male  who presents s/p EVAR of AAA and B CIA aneurysms with L CFA cutdown and subsequent emergent return to OR for repair of R CFA and PFA with bovine patch (05/07/18),  Recurrent candidiasis L groin   R groin should be healed in 1-2 weeks.  Soap and water to both groins BID with clean bandage to right groin wound after such.  Follow up in 6 months with EVAR duplex and ABI  I deferred further mgmt of the candidiasis to the patient's PCP as I tried a dose of Diflucan already.  Thank you for allowing [place] to participate in this patient's care.   Adele Barthel, MD, FACS [facility] Office: [phone] Pager: [phone]

## 2018-07-08 ENCOUNTER — Ambulatory Visit: Payer: BLUE CROSS/BLUE SHIELD | Admitting: Vascular Surgery

## 2018-07-10 ENCOUNTER — Encounter: Payer: Self-pay | Admitting: Vascular Surgery

## 2018-07-10 ENCOUNTER — Ambulatory Visit (INDEPENDENT_AMBULATORY_CARE_PROVIDER_SITE_OTHER): Payer: BLUE CROSS/BLUE SHIELD | Admitting: Vascular Surgery

## 2018-07-10 ENCOUNTER — Other Ambulatory Visit: Payer: Self-pay

## 2018-07-10 ENCOUNTER — Ambulatory Visit: Payer: BLUE CROSS/BLUE SHIELD | Admitting: Vascular Surgery

## 2018-07-10 VITALS — BP 169/90 | HR 76 | Temp 98.4°F | Resp 20 | Ht 69.0 in | Wt 274.6 lb

## 2018-07-10 DIAGNOSIS — I714 Abdominal aortic aneurysm, without rupture, unspecified: Secondary | ICD-10-CM

## 2018-07-21 ENCOUNTER — Ambulatory Visit (INDEPENDENT_AMBULATORY_CARE_PROVIDER_SITE_OTHER): Payer: BLUE CROSS/BLUE SHIELD | Admitting: Internal Medicine

## 2018-07-21 ENCOUNTER — Encounter: Payer: Self-pay | Admitting: Internal Medicine

## 2018-07-21 VITALS — BP 134/90 | HR 78 | Temp 98.0°F | Resp 24 | Ht 69.0 in | Wt 277.0 lb

## 2018-07-21 DIAGNOSIS — M545 Low back pain, unspecified: Secondary | ICD-10-CM

## 2018-07-21 DIAGNOSIS — J439 Emphysema, unspecified: Secondary | ICD-10-CM

## 2018-07-21 DIAGNOSIS — I2111 ST elevation (STEMI) myocardial infarction involving right coronary artery: Secondary | ICD-10-CM

## 2018-07-21 MED ORDER — TRAMADOL HCL 50 MG PO TABS: 50.0000 mg | ORAL_TABLET | Freq: Three times a day (TID) | ORAL | 0 refills | Status: DC | PRN

## 2018-07-21 MED ORDER — SPACER/AERO-HOLDING CHAMBERS DEVI: 1.0000 | Freq: Once | 0 refills | Status: AC

## 2018-07-21 MED ORDER — ALBUTEROL SULFATE HFA 108 (90 BASE) MCG/ACT IN AERS: 2.0000 | INHALATION_SPRAY | Freq: Four times a day (QID) | RESPIRATORY_TRACT | 3 refills | Status: DC | PRN

## 2018-07-21 MED ORDER — TIOTROPIUM BROMIDE MONOHYDRATE 18 MCG IN CAPS: 18.0000 ug | ORAL_CAPSULE | Freq: Every day | RESPIRATORY_TRACT | 11 refills | Status: DC

## 2018-07-21 NOTE — Addendum Note (Signed)
Addended by: Pilar Grammes on: 07/21/2018 03:20 PM   Modules accepted: Orders

## 2018-07-21 NOTE — Assessment & Plan Note (Signed)
Worse function lately Will restart spiriva Albuterol rescue inhaler

## 2018-07-21 NOTE — Assessment & Plan Note (Signed)
Seems to be muscular No radicular symptoms Discussed posture, heat, walking to loosen up Short course tramadol PT in not better next week

## 2018-07-21 NOTE — Progress Notes (Signed)
Subjective:    Patient ID: [name], male    DOB: 09/13/1956, 62 y.o.   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 Will have flare once a year or so Started again about 2 weeks ago----I sent the same muscle relaxer but not helping this year  Sitting, walking, standing are all painful Will feel weak in legs if trying to walk for a while---back will be bent over Some radiation to left leg and hip--but mostly in back  Also tried tylenol (headaches also)---not really helping  Also having DOE---gasps for air even with short walks Did have inhaler in the past---hadn't needed it for some time (but it did help) Only occasional cough Some wheezing  Current Outpatient Medications on File Prior to Visit  Medication Sig Dispense Refill  . atorvastatin (LIPITOR) 80 MG tablet Take 1 tablet (80 mg total) by mouth daily at 6 PM. 90 tablet 3  . carvedilol (COREG) 3.125 MG tablet Take 1 tablet (3.125 mg total) by mouth 2 (two) times daily with a meal. 180 tablet 3  . clopidogrel (PLAVIX) 75 MG tablet Take 1 tablet (75 mg total) by mouth daily. 30 tablet 11  . fluconazole (DIFLUCAN) 150 MG tablet Take 1 tablet (150 mg total) by mouth daily. 1 tablet 0  . losartan (COZAAR) 25 MG tablet Take 1 tablet (25 mg total) by mouth daily. 90 tablet 3  . metFORMIN (GLUCOPHAGE-XR) 500 MG 24 hr tablet Take 2 tablets (1,000 mg total) by mouth daily with breakfast. 60 tablet 11  . methocarbamol (ROBAXIN) 500 MG tablet Take 1 tablet (500 mg total) by mouth 3 (three) times daily as needed for muscle spasms. 60 tablet 0  . multivitamin (THERAGRAN) per tablet Take 1 tablet by mouth daily.     . nitroGLYCERIN (NITROSTAT) 0.4 MG SL tablet Place 1 tablet (0.4 mg total) under the tongue every 5 (five) minutes as needed. (Patient taking differently: Place 0.4 mg under the tongue every 5 (five) minutes  as needed for chest pain. ) 25 tablet 2  . spironolactone (ALDACTONE) 25 MG tablet Take 0.5 tablets (12.5 mg total) by mouth daily. 45 tablet 3  . vitamin B-12 (CYANOCOBALAMIN) 1000 MCG tablet Take 1,000 mcg by mouth daily.      No current facility-administered medications on file prior to visit.     No Known Allergies  Past Medical History:  Diagnosis Date  . Arthritis   . Automatic implantable cardioverter-defibrillator in situ   . Benign essential HTN 09/17/2016  . COPD (chronic obstructive pulmonary disease) ([place])    emphysema by CXR  . Coronary artery disease     LAD a 60-70% stenosis, moderate size second diagonal with 50% stenosis, circumflex AV groove 75-80% stenosis with a branching obtuse marginal with a superior branch subtotal stenosis followed by diffuse disease, right coronary artery  had nonobstructive disease.  EF was 35%  . Diabetes mellitus    type 2  no meds  . Diverticulosis of colon   . GERD (gastroesophageal reflux disease)    with esophagitis  . History of colonic polyps   . Hypertension   . Non-ischemic cardiomyopathy ([place])   . Presence of permanent cardiac pacemaker   . PVD (peripheral vascular disease) ([place])    bilateral common iliac artery aneurysms. Right SFA occlusion over a long segment. Left SFA disease with  occlusion of the left TP trunk. Artirogram Oct. 2006  . Shortness of breath    exertion  . Sleep apnea   . Tobacco abuse   . Urinary incontinence    detrussor instability    Past Surgical History:  Procedure Laterality Date  . ABDOMINAL AORTIC ENDOVASCULAR STENT GRAFT N/A 05/07/2018   Procedure: ABDOMINAL AORTIC ENDOVASCULAR STENT GRAFT;  Surgeon: Conrad Ensenada, MD;  Location: [facility];  Service: Vascular;  Laterality: N/A;  . CARDIAC CATHETERIZATION  06/2004   negative  . COLONOSCOPY  04/2005  . COPD exacerbation    . CORONARY ANGIOPLASTY  01/09/2018  . CORONARY STENT INTERVENTION N/A 01/10/2018   Procedure: CORONARY STENT INTERVENTION;   Surgeon: Lorretta Harp, MD;  Location: [facility];  Service: Cardiovascular;  Laterality: N/A;  . CORONARY/GRAFT ACUTE MI REVASCULARIZATION N/A 01/10/2018   Procedure: Coronary/Graft Acute MI Revascularization;  Surgeon: Lorretta Harp, MD;  Location: [facility];  Service: Cardiovascular;  Laterality: N/A;  . EMBOLIZATION Left 03/19/2018  . EMBOLIZATION Left 03/19/2018   Procedure: EMBOLIZATION - Left Internal;  Surgeon: Conrad Choc[place], MD;  Location: [facility];  Service: Cardiovascular;  Laterality: Left;  . EMBOLIZATION Right 04/16/2018   Procedure: EMBOLIZATION;  Surgeon: Conrad Lumpkin, MD;  Location: [facility];  Service: Cardiovascular;  Laterality: Right;  . FEMORAL ARTERY EXPLORATION Right 05/07/2018   Procedure: FEMORAL ARTERY EXPLORATION, EXTENDED PROFUNDAPLASTY;  Surgeon: Conrad Walworth, MD;  Location: [facility];  Service: Vascular;  Laterality: Right;  . INTRAOPERATIVE ARTERIOGRAM Right 05/07/2018   Procedure: INTRA OPERATIVE ARTERIOGRAM;  Surgeon: Conrad [place] Ridge, MD;  Location: [facility];  Service: Vascular;  Laterality: Right;  . LEFT HEART CATH AND CORONARY ANGIOGRAPHY N/A 01/10/2018   Procedure: LEFT HEART CATH AND CORONARY ANGIOGRAPHY;  Surgeon: Lorretta Harp, MD;  Location: [facility];  Service: Cardiovascular;  Laterality: N/A;  . PACEMAKER INSERTION  11/2005  . PACEMAKER LEAD REMOVAL N/A 10/17/2014   Procedure: PACEMAKER LEAD REMOVAL;  Surgeon: Evans Lance, MD;  Location: [facility];  Service: Cardiovascular;  Laterality: N/A;  . RENAL ANGIOGRAPHY Left 04/16/2018   Procedure: RENAL ANGIOGRAPHY;  Surgeon: Conrad [place], MD;  Location: [facility];  Service: Cardiovascular;  Laterality: Left;  . s/p ICD placement      Med[employer] Maximo [phone] single chamber    Family History  Problem Relation Age of Onset  . Stroke Father   . Coronary artery disease Maternal Aunt   . Heart failure Maternal Aunt   . Lung cancer Maternal Aunt   . Lung  cancer Maternal Uncle   . Cancer Brother        Mouth  . Cancer Sister        throat    Social History   Socioeconomic History  . Marital status: Married    Spouse name: Not on file  . Number of children: 2  . Years of education: Not on file  . Highest education level: Not on file  Occupational History  . Occupation: Grave digger--now disabled  Social Needs  . Financial resource strain: Not on file  . Food insecurity:    Worry: Not on file    Inability: Not on file  . Transportation needs:    Medical: Not on file    Non-medical: Not on file  Tobacco Use  . Smoking status: Former Smoker    Packs/day: 2.00    Years: 15.00  Pack years: 30.00    Types: Cigarettes    Last attempt to quit: 01/09/2018    Years since quitting: 0.5  . Smokeless tobacco: Never Used  . Tobacco comment: GAVE 1-800-QUIT-NOW  Substance and Sexual Activity  . Alcohol use: No  . Drug use: No  . Sexual activity: Never  Lifestyle  . Physical activity:    Days per week: Not on file    Minutes per session: Not on file  . Stress: Not on file  Relationships  . Social connections:    Talks on phone: Not on file    Gets together: Not on file    Attends religious service: Not on file    Active member of club or organization: Not on file    Attends meetings of clubs or organizations: Not on file    Relationship status: Not on file  . Intimate partner violence:    Fear of current or ex partner: Not on file    Emotionally abused: Not on file    Physically abused: Not on file    Forced sexual activity: Not on file  Other Topics Concern  . Not on file  Social History Narrative   No living will   Requests wife as health care POA   Would accept resuscitation but doesn't want prolonged ventilation   No tube feeds if cognitively unaware   Review of Systems No chest pain or angina now Sugars generally okay--checks weekly    Objective:   Physical Exam  Constitutional: No distress.  Respiratory:  Effort normal. No respiratory distress. He has no wheezes. He has no rales.  Slightly decreased breath sounds but clear  Musculoskeletal:  Tenderness along lumbar spine and right lumbar paraspinals SLR painful at about 60 degrees bilaterally ROM fairly normal in hips  Neurological:  Walks hunched over ----normal weight bearing though No leg weakness           Assessment & Plan:

## 2018-07-22 IMAGING — CT CT CTA ABD/PEL W/CM AND/OR W/O CM
2 of 6 series · 15 of 46 positions shown, 17 images · IV contrast (ISOVUE 370)
Comparison: 05/31/2008

CLINICAL DATA: Abdominal aortic aneurysm

EXAM:
CTA ABDOMEN AND PELVIS wITHOUT AND WITH CONTRAST
TECHNIQUE: Multidetector CT imaging of the abdomen and pelvis was performed
using the standard protocol during bolus administration of
intravenous contrast. Multiplanar reconstructed images and MIPs were
obtained and reviewed to evaluate the vascular anatomy.
CONTRAST:  100mL LVQWR5-HJL IOPAMIDOL (LVQWR5-HJL) INJECTION 76%

[Series 4: arterial 3.0 i40f 2 · axial · arterial · 0.98mm/px · z∈[-440,-26]mm · 12 of 163 slices shown, 14 images]
[im 13/163  soft-tissue]
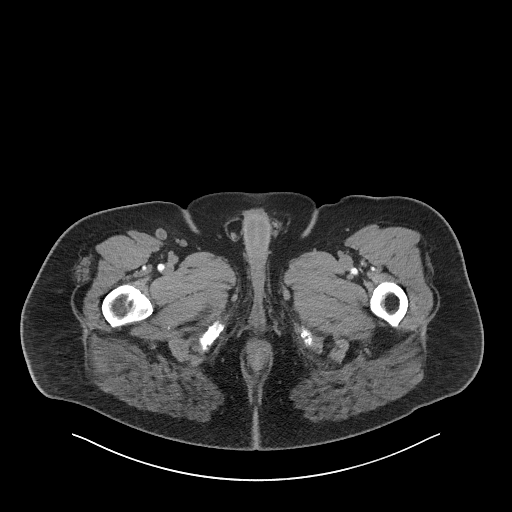
[im 13/163  bone]
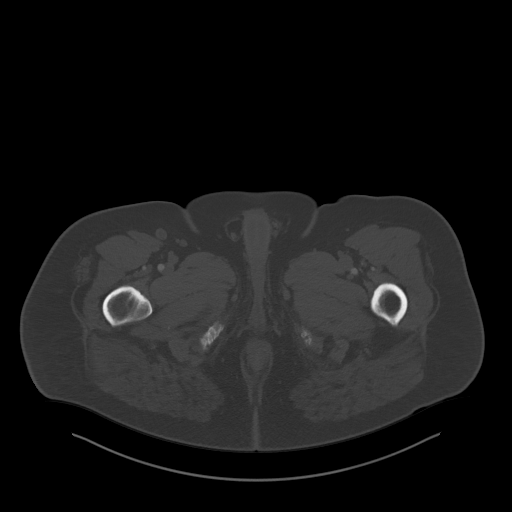
[im 25/163  soft-tissue]
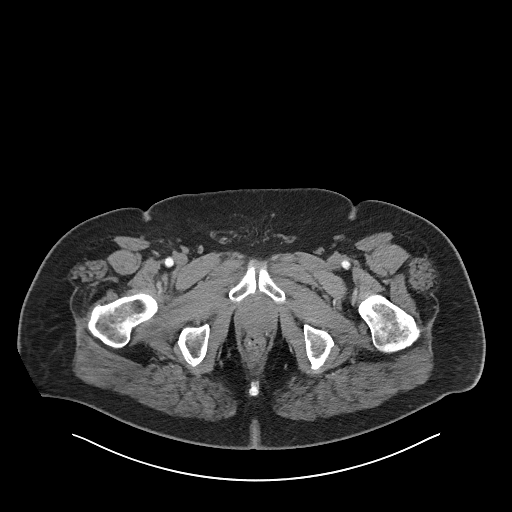
[im 37/163  soft-tissue]
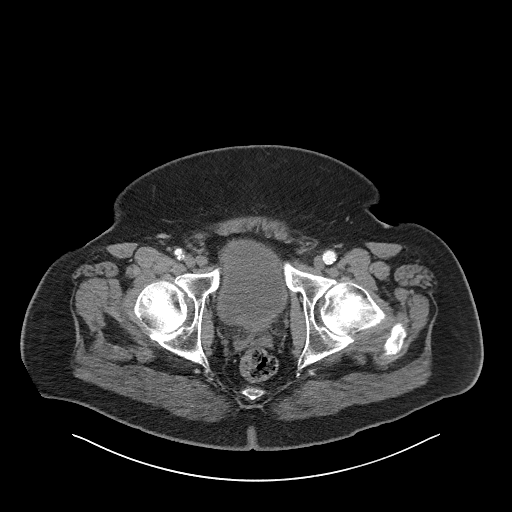
[im 49/163  soft-tissue]
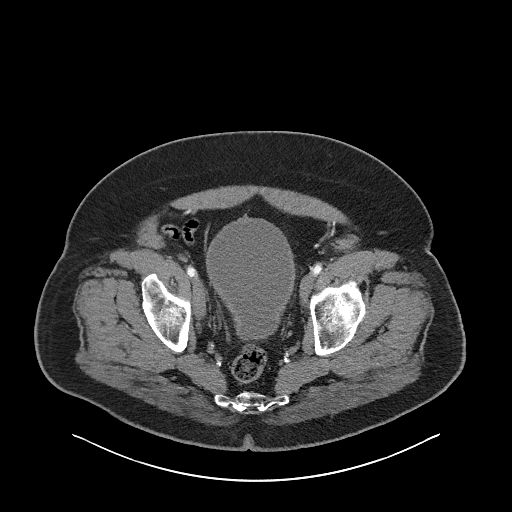
[im 61/163  soft-tissue]
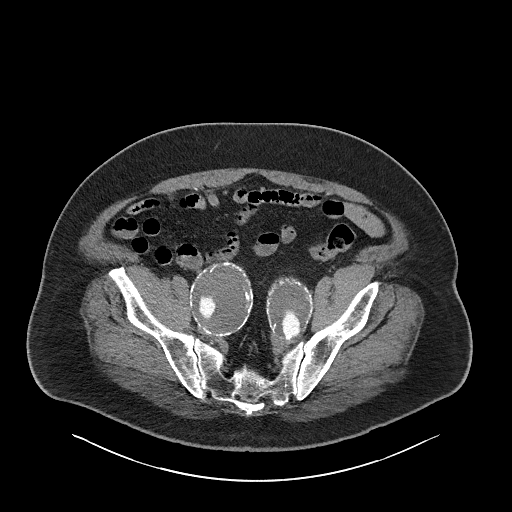
[im 73/163  soft-tissue]
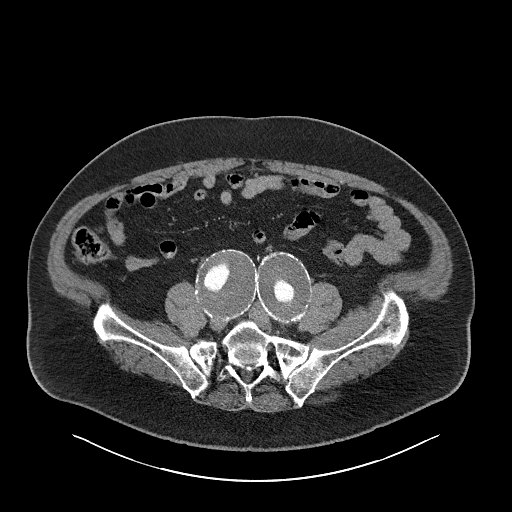
[im 91/163  soft-tissue]
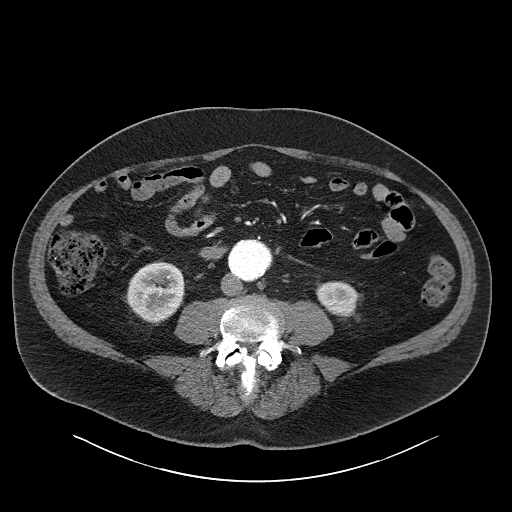
[im 103/163  soft-tissue]
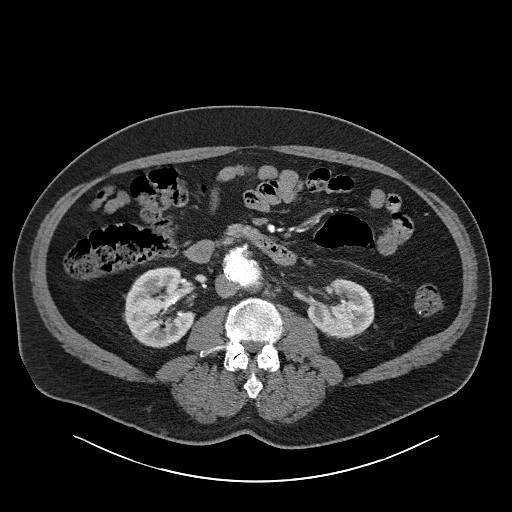
[im 115/163  soft-tissue]
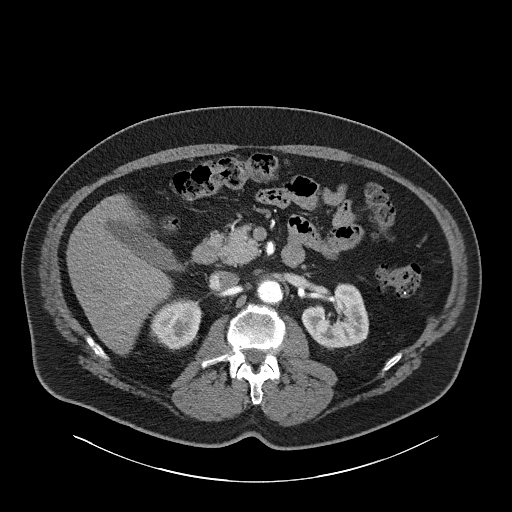
[im 115/163  bone]
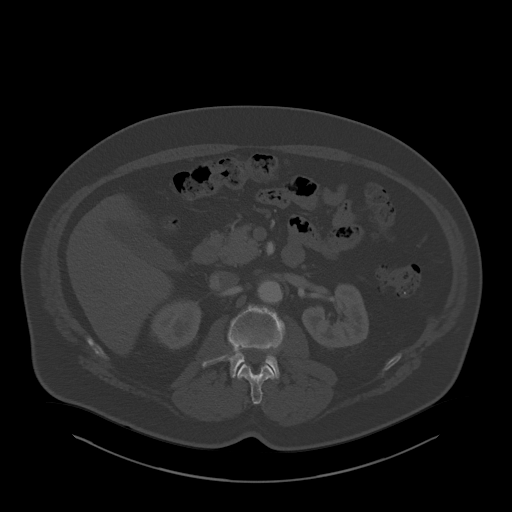
[im 127/163  soft-tissue]
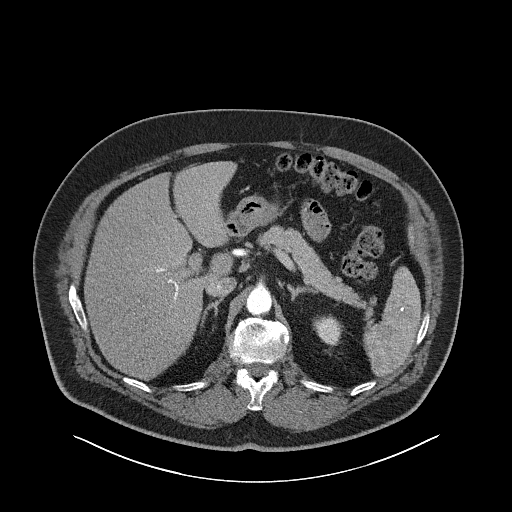
[im 139/163  soft-tissue]
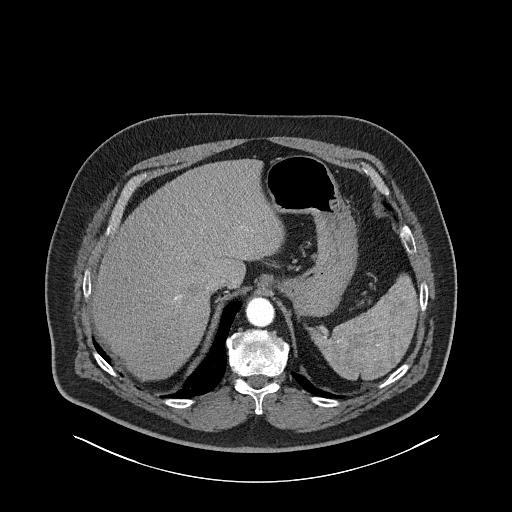
[im 151/163  soft-tissue]
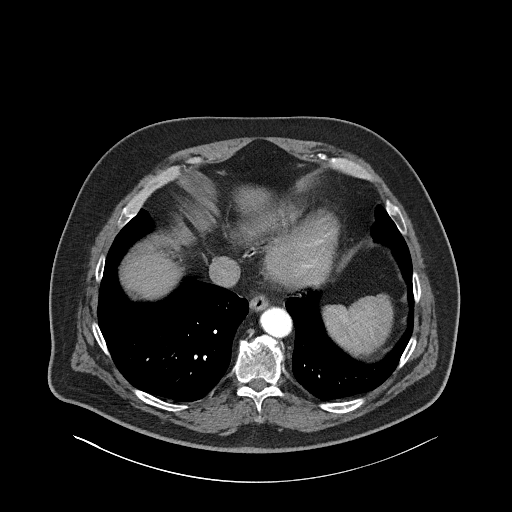

[Series 7: coronals · coronal · 0.97mm/px · 3 of 151 slices shown]
[im 38/151  soft-tissue]
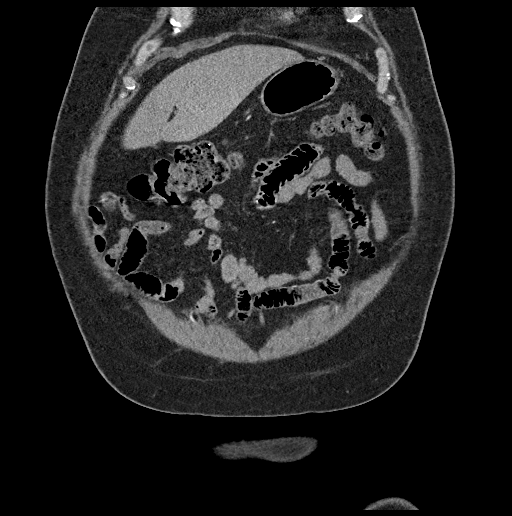
[im 76/151  soft-tissue]
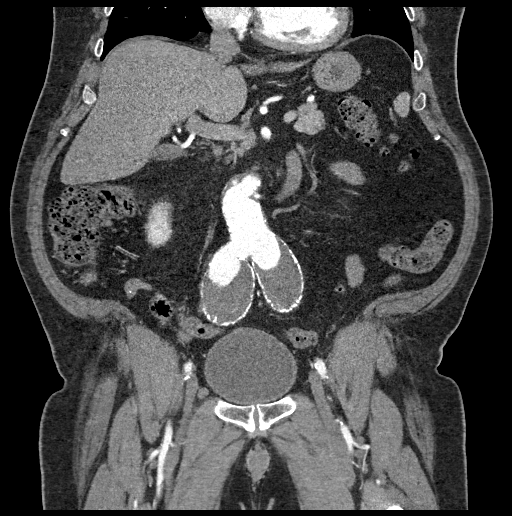
[im 113/151  soft-tissue]
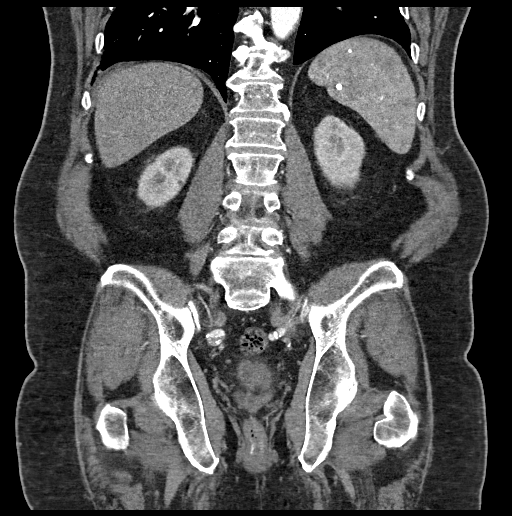

[15 of 46 positions shown; findings below may reference images not displayed]

FINDINGS: VASCULAR

Aorta: Atherosclerotic calcifications and plaque are seen throughout
the abdominal aorta. There are small penetrating ulcers in the
infrarenal abdominal aorta. Aneurysmal dilatation of the abdominal
aorta below the renal arteries measures up to 4.2 cm in maximal
diameter.

Celiac: Patent.  Accessory left hepatic artery anatomy.

SMA: There are diffuse atherosclerotic changes with smooth plaque in
the mid and distal SMA. Branch vessels grossly patent. No
significant narrowing.

Renals: Single right and 2 left renal arteries are patent with
scattered atherosclerotic calcification and plaque.

IMA: Diminutive and patent.

Inflow: Right common iliac artery is aneurysmal with a maximal AP
diameter 6.7 cm. Internal and external iliac arteries are
nonaneurysmal and patent with diffuse atherosclerotic changes.

Left common iliac artery is aneurysmal with a maximal AP diameter of
5.8 cm. The left internal and external iliac arteries are
nonaneurysmal. Significant narrowing at the origin of the left
external iliac artery is suspected. This is due to web like plaque.

Proximal Outflow: Bilateral superficial femoral artery occlusion.

Review of the MIP images confirms the above findings.

NON-VASCULAR

Lower chest: Basilar subsegmental atelectasis.

Hepatobiliary: Small calcified granuloma in the left lobe.
Gallbladder is unremarkable.

Pancreas: Unremarkable

Spleen: Calcified granulomata.

Adrenals/Urinary Tract: Kidneys and adrenal glands are within normal
limits. The posterior bladder is somewhat lobulated but without
obvious mass.

Stomach/Bowel: Normal appendix. No evidence of small-bowel
obstruction. No obvious mass in the colon.

Lymphatic: No evidence of abnormal retroperitoneal adenopathy.

Reproductive: Prostate is within normal limits.

Other: No free fluid.

Musculoskeletal: No vertebral compression deformity. Advanced
degenerative disc disease in the lumbar spine. Lumbosacral junction
is transitional. Spinal stenosis is noted in the lower lumbar spine.
IMPRESSION: VASCULAR

Abdominal aortic aneurysm measures 4.2 cm in diameter. Recommend
followup by ultrasound in 1 year. This recommendation follows ACR
consensus guidelines: White Paper of the ACR Incidental Findings
Committee II on Vascular Findings. [HOSPITAL] [id number];
[DATE].

Right common iliac artery aneurysm measures 6.7 cm compared with
cm on the prior study.

Left common iliac artery aneurysm measures 5.8 cm compared with
cm on the prior study.

Significant narrowing at the origin of the left external iliac
artery.

Bilateral superficial femoral artery occlusion.

NON-VASCULAR

No acute intra-abdominal pathology.

Lumbar spinal stenosis.

## 2018-08-10 ENCOUNTER — Other Ambulatory Visit: Payer: Self-pay

## 2018-08-10 DIAGNOSIS — I714 Abdominal aortic aneurysm, without rupture, unspecified: Secondary | ICD-10-CM

## 2018-08-10 DIAGNOSIS — E1151 Type 2 diabetes mellitus with diabetic peripheral angiopathy without gangrene: Secondary | ICD-10-CM

## 2018-08-25 ENCOUNTER — Ambulatory Visit (INDEPENDENT_AMBULATORY_CARE_PROVIDER_SITE_OTHER): Payer: BLUE CROSS/BLUE SHIELD | Admitting: Adult Health

## 2018-08-25 ENCOUNTER — Encounter: Payer: Self-pay | Admitting: Adult Health

## 2018-08-25 VITALS — BP 162/76 | HR 80 | Ht 69.0 in | Wt 278.4 lb

## 2018-08-25 DIAGNOSIS — I25110 Atherosclerotic heart disease of native coronary artery with unstable angina pectoris: Secondary | ICD-10-CM

## 2018-08-25 DIAGNOSIS — E118 Type 2 diabetes mellitus with unspecified complications: Secondary | ICD-10-CM

## 2018-08-25 DIAGNOSIS — Z79899 Other long term (current) drug therapy: Secondary | ICD-10-CM | POA: Diagnosis not present

## 2018-08-25 DIAGNOSIS — R079 Chest pain, unspecified: Secondary | ICD-10-CM | POA: Diagnosis not present

## 2018-08-25 DIAGNOSIS — E7800 Pure hypercholesterolemia, unspecified: Secondary | ICD-10-CM

## 2018-08-25 DIAGNOSIS — I10 Essential (primary) hypertension: Secondary | ICD-10-CM | POA: Diagnosis not present

## 2018-08-25 DIAGNOSIS — R0602 Shortness of breath: Secondary | ICD-10-CM

## 2018-08-25 DIAGNOSIS — I2111 ST elevation (STEMI) myocardial infarction involving right coronary artery: Secondary | ICD-10-CM

## 2018-08-25 NOTE — Progress Notes (Signed)
Cardiology Office Note   Date:  08/25/2018   ID:  [name], [name] 01-May-1956, MRN [id number]  PCP:  Venia Carbon, MD  Cardiologist: [facility]  Chief Complaint  Patient presents with  . Chest Pain  . Hypertension     History of Present Illness: [name] is a 62 y.o. male who presents for ongoing assessment and management of CAD, cardiac cath 2012 demonstrated 60%-70% LAD stenosis, moderate sized second diagaonal with 50% stenosis, Cx AV groove 75%-80% stenosis, with branching OM with superior branch subtotal stenosis followed by diffuse diease, RCA had non-obstructive disease, EF of 35%, managed medically.  Other hx of ICD in situ, but after obtaining ERI, Dr. Lovena Le elected to to replace the device. He did have a recalled lead but it was removed.   He had an inferior MI in 12/2017, requiring emergent DES to the mid RCA and occlude left Cx. EF of 40%-45%. He had some residual LAD disease in the LAD ans was planned for stress test with PCI if needed.  He also was noted to have an infrarenal AAA. He is followed by Dr. Bridgett Larsson and this has been repaired.   Study Highlights 01/21/2018   Nuclear stress EF: 35%.  There was no ST segment deviation noted during stress.  Defect 1: There is a large defect of moderate severity present in the basal inferior, basal inferolateral, mid inferior, mid inferolateral and apical inferior location.  Findings consistent with prior myocardial infarction with a small amount of peri-infarct ischemia.  This is a high risk study due to reduced systolic function.  The left ventricular ejection fraction is moderately decreased (30-44%).  There was frequent ventricular ectopy which can cause inaccurate calculation of ejection fraction   He states that over the last 4-5 days he has been having intermittent chest pain described sharp followed by burning pain. This is located on the left chest, non radiating, sometimes associated with diaphoresis, not  associated with exertion. He has had symptoms of burning in his chest in the past prior to having MI. This has become concerning to him and therefore he asked to be seen.   He is very sedentary, usually sits around watching TV or laying in the bed. He has felt tired and has had no energy for the last few days. He has been staying in bed more than being up and active over the last couple of days.   He has been seen by PCP who placed him on inhalers, as he states sometimes he cannot catch his breath while at rest. He admits to having OSA but is not compliant with CPAP, due to claustrophobia. He does not check his blood sugar at home and is unaware of how it is being controlled.   Past Medical History:  Diagnosis Date  . Arthritis   . Automatic implantable cardioverter-defibrillator in situ   . Benign essential HTN 09/17/2016  . COPD (chronic obstructive pulmonary disease) ([place])    emphysema by CXR  . Coronary artery disease     LAD a 60-70% stenosis, moderate size second diagonal with 50% stenosis, circumflex AV groove 75-80% stenosis with a branching obtuse marginal with a superior branch subtotal stenosis followed by diffuse disease, right coronary artery  had nonobstructive disease.  EF was 35%  . Diabetes mellitus    type 2  no meds  . Diverticulosis of colon   . GERD (gastroesophageal reflux disease)    with esophagitis  . History of colonic polyps   .  Hypertension   . Non-ischemic cardiomyopathy ([place])   . Presence of permanent cardiac pacemaker   . PVD (peripheral vascular disease) ([place])    bilateral common iliac artery aneurysms. Right SFA occlusion over a long segment. Left SFA disease with occlusion of the left TP trunk. Artirogram Oct. 2006  . Shortness of breath    exertion  . Sleep apnea   . Tobacco abuse   . Urinary incontinence    detrussor instability    Past Surgical History:  Procedure Laterality Date  . ABDOMINAL AORTIC ENDOVASCULAR STENT GRAFT N/A 05/07/2018    Procedure: ABDOMINAL AORTIC ENDOVASCULAR STENT GRAFT;  Surgeon: Conrad Rowes Run, MD;  Location: [facility];  Service: Vascular;  Laterality: N/A;  . CARDIAC CATHETERIZATION  06/2004   negative  . COLONOSCOPY  04/2005  . COPD exacerbation    . CORONARY ANGIOPLASTY  01/09/2018  . CORONARY STENT INTERVENTION N/A 01/10/2018   Procedure: CORONARY STENT INTERVENTION;  Surgeon: Lorretta Harp, MD;  Location: [place] [facility];  Service: Cardiovascular;  Laterality: N/A;  . CORONARY/GRAFT ACUTE MI REVASCULARIZATION N/A 01/10/2018   Procedure: Coronary/Graft Acute MI Revascularization;  Surgeon: Lorretta Harp, MD;  Location: [place] [facility];  Service: Cardiovascular;  Laterality: N/A;  . EMBOLIZATION Left 03/19/2018  . EMBOLIZATION Left 03/19/2018   Procedure: EMBOLIZATION - Left Internal;  Surgeon: Conrad Ortley, MD;  Location: [place] [facility];  Service: Cardiovascular;  Laterality: Left;  . EMBOLIZATION Right 04/16/2018   Procedure: EMBOLIZATION;  Surgeon: Conrad Loudon, MD;  Location: [place] [facility];  Service: Cardiovascular;  Laterality: Right;  . FEMORAL ARTERY EXPLORATION Right 05/07/2018   Procedure: FEMORAL ARTERY EXPLORATION, EXTENDED PROFUNDAPLASTY;  Surgeon: Conrad Pritchett, MD;  Location: [place];  Service: Vascular;  Laterality: Right;  . INTRAOPERATIVE ARTERIOGRAM Right 05/07/2018   Procedure: INTRA OPERATIVE ARTERIOGRAM;  Surgeon: Conrad Van Alstyne, MD;  Location: [place];  Service: Vascular;  Laterality: Right;  . LEFT HEART CATH AND CORONARY ANGIOGRAPHY N/A 01/10/2018   Procedure: LEFT HEART CATH AND CORONARY ANGIOGRAPHY;  Surgeon: Lorretta Harp, MD;  Location: [place] [facility];  Service: Cardiovascular;  Laterality: N/A;  . PACEMAKER INSERTION  11/2005  . PACEMAKER LEAD REMOVAL N/A 10/17/2014   Procedure: PACEMAKER LEAD REMOVAL;  Surgeon: Evans Lance, MD;  Location: [place];  Service: Cardiovascular;  Laterality: N/A;  . RENAL ANGIOGRAPHY Left 04/16/2018   Procedure: RENAL  ANGIOGRAPHY;  Surgeon: Conrad , MD;  Location: [place] [facility];  Service: Cardiovascular;  Laterality: Left;  . s/p ICD placement      Medtronic Maximo [phone] single chamber     Current Outpatient Medications  Medication Sig Dispense Refill  . albuterol (PROVENTIL HFA;VENTOLIN HFA) 108 (90 Base) MCG/ACT inhaler Inhale 2 puffs into the lungs every 6 (six) hours as needed for wheezing or shortness of breath. 1 Inhaler 3  . atorvastatin (LIPITOR) 80 MG tablet Take 1 tablet (80 mg total) by mouth daily at 6 PM. 90 tablet 3  . carvedilol (COREG) 3.125 MG tablet Take 1 tablet (3.125 mg total) by mouth 2 (two) times daily with a meal. 180 tablet 3  . clopidogrel (PLAVIX) 75 MG tablet Take 1 tablet (75 mg total) by mouth daily. 30 tablet 11  . losartan (COZAAR) 25 MG tablet Take 1 tablet (25 mg total) by mouth daily. 90 tablet 3  . metFORMIN (GLUCOPHAGE-XR) 500 MG 24 hr tablet Take 2 tablets (1,000 mg total) by mouth daily with breakfast.  60 tablet 11  . methocarbamol (ROBAXIN) 500 MG tablet Take 1 tablet (500 mg total) by mouth 3 (three) times daily as needed for muscle spasms. 60 tablet 0  . multivitamin (THERAGRAN) per tablet Take 1 tablet by mouth daily.     . nitroGLYCERIN (NITROSTAT) 0.4 MG SL tablet Place 1 tablet (0.4 mg total) under the tongue every 5 (five) minutes as needed. (Patient taking differently: Place 0.4 mg under the tongue every 5 (five) minutes as needed for chest pain. ) 25 tablet 2  . spironolactone (ALDACTONE) 25 MG tablet Take 0.5 tablets (12.5 mg total) by mouth daily. 45 tablet 3  . tiotropium (SPIRIVA) 18 MCG inhalation capsule Place 1 capsule (18 mcg total) into inhaler and inhale daily. 30 capsule 11  . vitamin B-12 (CYANOCOBALAMIN) 1000 MCG tablet Take 1,000 mcg by mouth daily.      No current facility-administered medications for this visit.     Allergies:   Patient has no known allergies.    Social History:  The patient  reports that he quit smoking about  7 months ago. His smoking use included cigarettes. He has a 30.00 pack-year smoking history. He has never used smokeless tobacco. He reports that he does not drink alcohol or use drugs.   Family History:  The patient's family history includes Cancer in his brother and sister; Coronary artery disease in his maternal aunt; Heart failure in his maternal aunt; Lung cancer in his maternal aunt and maternal uncle; Stroke in his father.    ROS: All other systems are reviewed and negative. Unless otherwise mentioned in H&P    PHYSICAL EXAM: VS:  BP (!) 162/76 (BP Location: Right Arm, Patient Position: Sitting, Cuff Size: Large)   Pulse 80   Ht 5' 9"  (1.753 m)   Wt 278 lb 6.4 oz (126.3 kg)   BMI 41.11 kg/m  , BMI Body mass index is 41.11 kg/m. GEN: Well nourished, well developed, in no acute distress  HEENT: normal  Neck: no JVD, carotid bruits, or masses Cardiac: IRRR; no murmurs, rubs, or gallops,no edema  Respiratory:  Clear to auscultation bilaterally, normal work of breathing GI: soft, nontender, nondistended, + BS, Central obesity.  MS: no deformity or atrophy  Skin: warm and dry, no rash Neuro:  Strength and sensation are intact Psych: euthymic mood, flat affect, appears depressed.   EKG: SR with lst degree AV Block, bigeminy, with LVH.  Recent Labs: 01/09/2018: B Natriuretic Peptide 26.1 01/10/2018: TSH 1.214 04/30/2018: ALT 30 05/08/2018: Magnesium 1.5 05/09/2018: BUN 14; Creatinine, Ser 1.10; Hemoglobin 12.8; Platelets 209; Potassium 4.0; Sodium 134    Lipid Panel    Component Value Date/Time   CHOL 117 01/28/2018 1624   TRIG 223.0 (H) 01/28/2018 1624   TRIG 575 (HH) 01/02/2007 0000   HDL 29.10 (L) 01/28/2018 1624   CHOLHDL 4 01/28/2018 1624   VLDL 44.6 (H) 01/28/2018 1624   LDLCALC UNABLE TO CALCULATE IF TRIGLYCERIDE OVER 400 mg/dL 01/09/2018 2323   LDLDIRECT 35.0 01/28/2018 1624      Wt Readings from Last 3 Encounters:  08/25/18 278 lb 6.4 oz (126.3 kg)  07/21/18 277  lb (125.6 kg)  07/10/18 274 lb 9.6 oz (124.6 kg)      Other studies Reviewed: Echocardiogram 2018-01-12 Left ventricle: The cavity size was normal. There was severe   concentric hypertrophy. Systolic function was mildly to   moderately reduced. The estimated ejection fraction was in the   range of 40% to 45%. Diffuse hypokinesis. Doppler  parameters are   consistent with abnormal left ventricular relaxation (grade 1   diastolic dysfunction). LV filling pressure appears elevated.   Ejection fraction (MOD, 2-plane): 41%. - Aortic valve: Sclerosis without stenosis. There was no   regurgitation. - Mitral valve: Mildly thickened leaflets . There was trivial   regurgitation. - Left atrium: The atrium was normal in size. - Inferior vena cava: The vessel was normal in size. The   respirophasic diameter changes were in the normal range (>= 50%),   consistent with normal central venous pressure.  Impressions:  - Compared to a prior study in 04/2017, the LVEF has reduced from   50-55% down to 40-45% with global hypokinesis.  CT of the abdomen  06/09/2018 IMPRESSION: VASCULAR  Aorto bi-iliac stent graft has been placed. Maximal aortic sac diameter is 4.3 cm compared with 4.2 cm on the prior study. Maximalright common iliac artery sac diameter is 6.4 cm compared with 6.6 cm on the prior study. Maximal left common iliac artery sac diameter is stable at 5.4 cm. There is no evidence of endoleak within any of these 3 aneurysm sacs. Bilateral internal iliac artery coil embolization is noted.  Bilateral external iliac artery stent with patency of the external iliac arteries bilaterally.  Stable bilateral superficial femoral artery occlusion.  Excluded small accessory left renal artery with hypoperfusion in the lower pole segment.   ASSESSMENT AND PLAN:  1. CAD: Known history of multi-vessel CAD, DES to the RCA in 12/2017, with occluded Cx. EF of 40-45%.  His symptoms are similar but not  exactly like his prior angina pain. He has not been consistent on taking his medications, and checking his BP. I will repeat his Carlton Adam, even though it was done in 12/2017 as he continues to have significant risk factors with diabetes and hypercholesterolemia, and uncontrolled BP.  This will be a two-day stress as his body habitus of central obesity will require this.   2. Hypertension: I have rechecked it in the exam room myself. BP remains elevated at 168/72. He has not taken his antihypertensive medications today. I have asked him to take his medications as directed and record his BP twice a day one hour after he has taken his medication. They will bring this record back on his next visit.  I will repeat his echo to assist with medication management. Possibly change losartan to Entresto.   3. Diabetes: He is not consistently checking BG at home, and has not adhered to a diabetic diet. I have asked him to be more mindful of his eating habits to keep BG under control. I will check labs to include Hgb A1C.   4. PAD: Followed by Dr. Bridgett Larsson   Current medicines are reviewed at length with the patient today.    Labs/ tests ordered today include: BMET, CBC, Hgb A1c, Lipids and LFT's  Phill Myron. West Pugh, ANP, AACC   08/25/2018 11:17 AM    [place]. [address] [place], [place] [address] Phone: [phone]; Fax: [phone]

## 2018-08-25 NOTE — Patient Instructions (Signed)
Medication Instructions:  NO CHANGES- Your physician recommends that you continue on your current medications as directed. Please refer to the Current Medication list given to you today.  If you need a refill on your cardiac medications before your next appointment, please call your pharmacy.  Labwork: A1C,BMET,CBC,FLP AND LFT TODAY HERE IN OUR OFFICE AT [facility]  Take the provided lab slips with you to the lab for your blood draw.   Testing/Procedures: Echocardiogram - Your physician has requested that you have an echocardiogram. Echocardiography is a painless test that uses sound waves to create images of your heart. It provides your doctor with information about the size and shape of your heart and how well your heart's chambers and valves are working. This procedure takes approximately one hour. There are no restrictions for this procedure. This will be performed at our [facility] location - [address].  Your physician has requested that you have a lexiscan myoview. A cardiac stress test is a cardiological test that measures the heart's ability to respond to external stress in a controlled clinical environment. The stress response is induced byintravenous pharmacological stimulation.  Special Instructions: MAKE SURE TO TAKE YOUR MEDICATIONS  IF YOU SHOULD HAVE CHEST PAIN MAKE SURE TO TAKE YOUR NITRO....MAY TAKE 1 TAB EVERY 5 MINUTES FOR 3 DOSES IF THIS DOES NOT RELIEVE CHEST PAIN HAVE SOMEONE DRIVE YOU DIRECTLY TO THE ER!  TAKE AND LOG YOUR BP TWICE DAILY-BRING LOG WITH YOU TO YOUR FOLLOW UP APPOINTMENTS   Follow-Up: Your physician wants you to follow-up in: [place] (NURSE PRACTIONIER), DNP,AACC IF PRIMARY CARDIOLOGIST IS UNAVAILABLE.    Thank you for choosing [facility] at [facility]!!

## 2018-08-26 ENCOUNTER — Telehealth: Payer: Self-pay | Admitting: Cardiology

## 2018-08-26 ENCOUNTER — Ambulatory Visit (HOSPITAL_COMMUNITY)
Admission: RE | Admit: 2018-08-26 | Discharge: 2018-08-26 | Disposition: A | Discharge status: Home / Self Care | Payer: BLUE CROSS/BLUE SHIELD | Source: Ambulatory Visit | Attending: Cardiovascular Disease | Admitting: Cardiovascular Disease

## 2018-08-26 DIAGNOSIS — R079 Chest pain, unspecified: Secondary | ICD-10-CM

## 2018-08-26 DIAGNOSIS — R0602 Shortness of breath: Secondary | ICD-10-CM | POA: Diagnosis not present

## 2018-08-26 DIAGNOSIS — I10 Essential (primary) hypertension: Secondary | ICD-10-CM

## 2018-08-26 DIAGNOSIS — I25110 Atherosclerotic heart disease of native coronary artery with unstable angina pectoris: Secondary | ICD-10-CM | POA: Diagnosis not present

## 2018-08-26 LAB — BASIC METABOLIC PANEL
BUN/Creatinine Ratio: 13 (ref 10–24)
BUN: 9 mg/dL (ref 8–27)
CALCIUM: 8.7 mg/dL (ref 8.6–10.2)
CHLORIDE: 102 mmol/L (ref 96–106)
CO2: 20 mmol/L (ref 20–29)
Creatinine, Ser: 0.72 mg/dL — ABNORMAL LOW (ref 0.76–1.27)
GFR calc Af Amer: 116 mL/min/{1.73_m2} (ref >59)
GFR calc non Af Amer: 101 mL/min/{1.73_m2} (ref >59)
GLUCOSE: 216 mg/dL — AB (ref 65–99)
POTASSIUM: 4.3 mmol/L (ref 3.5–5.2)
SODIUM: 139 mmol/L (ref 134–144)

## 2018-08-26 LAB — HEPATIC FUNCTION PANEL
ALT: 22 IU/L (ref 0–44)
AST: 18 IU/L (ref 0–40)
Albumin: 4.4 g/dL (ref 3.6–4.8)
Alkaline Phosphatase: 125 IU/L — ABNORMAL HIGH (ref 39–117)
BILIRUBIN TOTAL: 0.4 mg/dL (ref 0.0–1.2)
Bilirubin, Direct: 0.14 mg/dL (ref 0.00–0.40)
Total Protein: 6.9 g/dL (ref 6.0–8.5)

## 2018-08-26 LAB — LIPID PANEL
CHOL/HDL RATIO: 3.8 ratio (ref 0.0–5.0)
Cholesterol, Total: 156 mg/dL (ref 100–199)
HDL: 41 mg/dL (ref >39)
LDL CALC: 35 mg/dL (ref 0–99)
TRIGLYCERIDES: 400 mg/dL — AB (ref 0–149)
VLDL CHOLESTEROL CAL: 80 mg/dL — AB (ref 5–40)

## 2018-08-26 LAB — CBC
Hematocrit: 41.6 % (ref 37.5–51.0)
Hemoglobin: 14.2 g/dL (ref 13.0–17.7)
MCH: 28.1 pg (ref 26.6–33.0)
MCHC: 34.1 g/dL (ref 31.5–35.7)
MCV: 82 fL (ref 79–97)
PLATELETS: 264 10*3/uL (ref 150–450)
RBC: 5.06 x10E6/uL (ref 4.14–5.80)
RDW: 14.2 % (ref 12.3–15.4)
WBC: 6.9 10*3/uL (ref 3.4–10.8)

## 2018-08-26 LAB — TSH: TSH: 2.46 u[IU]/mL (ref 0.450–4.500)

## 2018-08-26 LAB — HEMOGLOBIN A1C
ESTIMATED AVERAGE GLUCOSE: 169 mg/dL
HEMOGLOBIN A1C: 7.5 % — AB (ref 4.8–5.6)

## 2018-08-26 MED ORDER — REGADENOSON 0.4 MG/5ML IV SOLN
0.4000 mg | Freq: Once | INTRAVENOUS | Status: AC
  Administered 2018-08-26: 0.4 mg via INTRAVENOUS

## 2018-08-26 MED ORDER — TECHNETIUM TC 99M TETROFOSMIN IV KIT
26.3000 | PACK | Freq: Once | INTRAVENOUS | Status: AC | PRN
  Administered 2018-08-26: 26.3 via INTRAVENOUS
  Filled 2018-08-26: qty 27

## 2018-08-26 MED ORDER — AMINOPHYLLINE 25 MG/ML IV SOLN
75.0000 mg | Freq: Once | INTRAVENOUS | Status: AC
  Administered 2018-08-26: 75 mg via INTRAVENOUS

## 2018-08-26 NOTE — Telephone Encounter (Signed)
New Message: ° ° ° °Patient is calling for lab results  °

## 2018-08-27 ENCOUNTER — Ambulatory Visit (HOSPITAL_COMMUNITY)
Admission: RE | Admit: 2018-08-27 | Discharge: 2018-08-27 | Disposition: A | Discharge status: Home / Self Care | Payer: BLUE CROSS/BLUE SHIELD | Source: Ambulatory Visit | Attending: Cardiovascular Disease | Admitting: Cardiovascular Disease

## 2018-08-27 LAB — MYOCARDIAL PERFUSION IMAGING
CHL CUP NUCLEAR SDS: 5
CHL CUP NUCLEAR SRS: 9
CHL CUP RESTING HR STRESS: 68 {beats}/min
CSEPPHR: 91 {beats}/min
LV dias vol: 300 mL (ref 62–150)
LVSYSVOL: 217 mL
NUC STRESS TID: 0.95
SSS: 14

## 2018-08-27 MED ORDER — TECHNETIUM TC 99M TETROFOSMIN IV KIT
23.7000 | PACK | Freq: Once | INTRAVENOUS | Status: AC | PRN
  Administered 2018-08-27: 23.7 via INTRAVENOUS

## 2018-08-27 NOTE — Telephone Encounter (Signed)
Pt's wife updated with Lab results along with Jory Sims, DNP recommendation. Verbalized understanding. Wife also states pt had second part of Lexiscan and took all his morning medication before test but BP has been running high. Last BP was 190/91. Pt is asymptomatic but wanting to know if the test could be the cause of the elevation. Will route for recommendation.

## 2018-08-27 NOTE — Telephone Encounter (Signed)
The Lexiscan does not usually create elevated BP. It may be from the aggravation of being her so long. He is to rest and keep track of BP. If remains elevated tomorrow he should call back.

## 2018-08-27 NOTE — Telephone Encounter (Signed)
Wife updated with Jory Sims, DNP recommendation. Verbalized understanding.

## 2018-08-28 ENCOUNTER — Other Ambulatory Visit: Payer: Self-pay

## 2018-08-28 ENCOUNTER — Ambulatory Visit (HOSPITAL_COMMUNITY): Payer: BLUE CROSS/BLUE SHIELD | Attending: Adult Health

## 2018-08-28 DIAGNOSIS — I119 Hypertensive heart disease without heart failure: Secondary | ICD-10-CM | POA: Diagnosis not present

## 2018-08-28 DIAGNOSIS — I25110 Atherosclerotic heart disease of native coronary artery with unstable angina pectoris: Secondary | ICD-10-CM

## 2018-08-28 DIAGNOSIS — R079 Chest pain, unspecified: Secondary | ICD-10-CM | POA: Diagnosis not present

## 2018-08-28 DIAGNOSIS — R0602 Shortness of breath: Secondary | ICD-10-CM

## 2018-08-28 DIAGNOSIS — I10 Essential (primary) hypertension: Secondary | ICD-10-CM

## 2018-08-31 NOTE — Progress Notes (Signed)
Cardiology Office Note   Date:  09/01/2018   ID:  [name], [name] [birth year], MRN [id number]  PCP:  Venia Carbon, MD  Cardiologist:  [facility]  Chief Complaint  Patient presents with  . Coronary Artery Disease  . Chest Pain  . Cardiomyopathy     History of Present Illness: [name] is a 62 y.o. male who presents for ongoing assessment and management of chest pain and hypertension. He has a history of CAD with cardiac cath 2012 demonstrated 60%-70% LAD stenosis, moderate sized second diagaonal with 50% stenosis, Cx AV groove 75%-80% stenosis, with branching OM with superior branch subtotal stenosis followed by diffuse diease, RCA had non-obstructive disease, EF of 35%, managed medically; ICD in situ, will not be replacing at ERI per Dr. Lovena Le.   He had an inferior MI in 12/2017, requiring emergent DES to the mid RCA and occlude left Cx. EF of 40%-45%. He had some residual LAD disease in the LAD ans was planned for stress test with PCI if needed.  He also was noted to have an infrarenal AAA. He is followed by Dr. Bridgett Larsson and this has been repaired.  On last office visit on 08/25/2018 he complained of intermittent chest pain, described as sharp and burning, associated with diaphoresis. Symptoms were similar to prior MI symptoms. Stress test was ordered for evaluation of progression of CAD.  Stress test dated 08/27/2018 was negative for new areas of ischemia. EF was decreased to <30%.  Echocardiogram revealed improved EF of 45% from prior echo.   He comes today without new complaints. He continues to have some dyspnea.   Past Medical History:  Diagnosis Date  . Arthritis   . Automatic implantable cardioverter-defibrillator in situ   . Benign essential HTN 09/17/2016  . COPD (chronic obstructive pulmonary disease) ([place])    emphysema by CXR  . Coronary artery disease     LAD a 60-70% stenosis, moderate size second diagonal with 50% stenosis, circumflex AV groove 75-80% stenosis with a  branching obtuse marginal with a superior branch subtotal stenosis followed by diffuse disease, right coronary artery  had nonobstructive disease.  EF was 35%  . Diabetes mellitus    type 2  no meds  . Diverticulosis of colon   . GERD (gastroesophageal reflux disease)    with esophagitis  . History of colonic polyps   . Hypertension   . Non-ischemic cardiomyopathy ([place])   . Presence of permanent cardiac pacemaker   . PVD (peripheral vascular disease) ([place])    bilateral common iliac artery aneurysms. Right SFA occlusion over a long segment. Left SFA disease with occlusion of the left TP trunk. Artirogram Oct. 2006  . Shortness of breath    exertion  . Sleep apnea   . Tobacco abuse   . Urinary incontinence    detrussor instability    Past Surgical History:  Procedure Laterality Date  . ABDOMINAL AORTIC ENDOVASCULAR STENT GRAFT N/A 05/07/2018   Procedure: ABDOMINAL AORTIC ENDOVASCULAR STENT GRAFT;  Surgeon: Conrad Concord, MD;  Location: [place];  Service: Vascular;  Laterality: N/A;  . CARDIAC CATHETERIZATION  06/2004   negative  . COLONOSCOPY  04/2005  . COPD exacerbation    . CORONARY ANGIOPLASTY  01/09/2018  . CORONARY STENT INTERVENTION N/A 01/10/2018   Procedure: CORONARY STENT INTERVENTION;  Surgeon: Lorretta Harp, MD;  Location: [place] [facility];  Service: Cardiovascular;  Laterality: N/A;  . CORONARY/GRAFT ACUTE MI REVASCULARIZATION N/A 01/10/2018   Procedure: Coronary/Graft Acute  MI Revascularization;  Surgeon: Lorretta Harp, MD;  Location: [place] [facility];  Service: Cardiovascular;  Laterality: N/A;  . EMBOLIZATION Left 03/19/2018  . EMBOLIZATION Left 03/19/2018   Procedure: EMBOLIZATION - Left Internal;  Surgeon: Conrad Arley, MD;  Location: [place] [facility];  Service: Cardiovascular;  Laterality: Left;  . EMBOLIZATION Right 04/16/2018   Procedure: EMBOLIZATION;  Surgeon: Conrad [place], MD;  Location: [place] [facility];  Service: Cardiovascular;  Laterality:  Right;  . FEMORAL ARTERY EXPLORATION Right 05/07/2018   Procedure: FEMORAL ARTERY EXPLORATION, EXTENDED PROFUNDAPLASTY;  Surgeon: Conrad Harcourt, MD;  Location: [place];  Service: Vascular;  Laterality: Right;  . INTRAOPERATIVE ARTERIOGRAM Right 05/07/2018   Procedure: INTRA OPERATIVE ARTERIOGRAM;  Surgeon: Conrad [place], MD;  Location: [place];  Service: Vascular;  Laterality: Right;  . LEFT HEART CATH AND CORONARY ANGIOGRAPHY N/A 01/10/2018   Procedure: LEFT HEART CATH AND CORONARY ANGIOGRAPHY;  Surgeon: Lorretta Harp, MD;  Location: [place] [facility];  Service: Cardiovascular;  Laterality: N/A;  . PACEMAKER INSERTION  11/2005  . PACEMAKER LEAD REMOVAL N/A 10/17/2014   Procedure: PACEMAKER LEAD REMOVAL;  Surgeon: Evans Lance, MD;  Location: [place];  Service: Cardiovascular;  Laterality: N/A;  . RENAL ANGIOGRAPHY Left 04/16/2018   Procedure: RENAL ANGIOGRAPHY;  Surgeon: Conrad , MD;  Location: [place] [facility];  Service: Cardiovascular;  Laterality: Left;  . s/p ICD placement      Medtronic Maximo [phone] single chamber     Current Outpatient Medications  Medication Sig Dispense Refill  . albuterol (PROVENTIL HFA;VENTOLIN HFA) 108 (90 Base) MCG/ACT inhaler Inhale 2 puffs into the lungs every 6 (six) hours as needed for wheezing or shortness of breath. 1 Inhaler 3  . atorvastatin (LIPITOR) 80 MG tablet Take 1 tablet (80 mg total) by mouth daily at 6 PM. 90 tablet 3  . clopidogrel (PLAVIX) 75 MG tablet Take 1 tablet (75 mg total) by mouth daily. 30 tablet 11  . losartan (COZAAR) 25 MG tablet Take 1 tablet (25 mg total) by mouth daily. 90 tablet 3  . metFORMIN (GLUCOPHAGE-XR) 500 MG 24 hr tablet Take 2 tablets (1,000 mg total) by mouth daily with breakfast. 60 tablet 11  . methocarbamol (ROBAXIN) 500 MG tablet Take 1 tablet (500 mg total) by mouth 3 (three) times daily as needed for muscle spasms. 60 tablet 0  . multivitamin (THERAGRAN) per tablet Take 1 tablet by mouth daily.     .  nitroGLYCERIN (NITROSTAT) 0.4 MG SL tablet Place 1 tablet (0.4 mg total) under the tongue every 5 (five) minutes as needed. (Patient taking differently: Place 0.4 mg under the tongue every 5 (five) minutes as needed for chest pain. ) 25 tablet 2  . spironolactone (ALDACTONE) 25 MG tablet Take 0.5 tablets (12.5 mg total) by mouth daily. 45 tablet 3  . tiotropium (SPIRIVA) 18 MCG inhalation capsule Place 1 capsule (18 mcg total) into inhaler and inhale daily. 30 capsule 11  . vitamin B-12 (CYANOCOBALAMIN) 1000 MCG tablet Take 1,000 mcg by mouth daily.     . bisoprolol (ZEBETA) 10 MG tablet Take 1 tablet (10 mg total) by mouth daily. 90 tablet 3   No current facility-administered medications for this visit.     Allergies:   Patient has no known allergies.    Social History:  The patient  reports that he quit smoking about 7 months ago. His smoking use included cigarettes. He has a 30.00 pack-year smoking  history. He has never used smokeless tobacco. He reports that he does not drink alcohol or use drugs.   Family History:  The patient's family history includes Cancer in his brother and sister; Coronary artery disease in his maternal aunt; Heart failure in his maternal aunt; Lung cancer in his maternal aunt and maternal uncle; Stroke in his father.    ROS: All other systems are reviewed and negative. Unless otherwise mentioned in H&P    PHYSICAL EXAM: VS:  BP 122/70   Pulse 98   Ht 5\' 9"  (1.753 m)   Wt 274 lb 12.8 oz (124.6 kg)   BMI 40.58 kg/m  , BMI Body mass index is 40.58 kg/m. GEN: Well nourished, well developed, in no acute distress  HEENT: normal  Neck: no JVD, carotid bruits, or masses Cardiac: IRRR; soft systolic murmurs, rubs, or gallops,no edema  Respiratory: Clear to auscultation bilaterally, diminished in the bases, normal work of breathing GI: soft, nontender, nondistended, + BS Obese MS: no deformity or atrophy  Skin: warm and dry, no rash Neuro:  Strength and sensation  are intact Psych: euthymic mood, full affect   EKG:  SR with multiple PVC's, couplets, rate of 98 bpm, LVH was noted.   Recent Labs: 01/09/2018: B Natriuretic Peptide 26.1 05/08/2018: Magnesium 1.5 08/25/2018: ALT 22; BUN 9; Creatinine, Ser 0.72; Hemoglobin 14.2; Platelets 264; Potassium 4.3; Sodium 139; TSH 2.460    Lipid Panel    Component Value Date/Time   CHOL 156 08/25/2018 1154   TRIG 400 (H) 08/25/2018 1154   TRIG 575 (HH) 01/02/2007 0000   HDL 41 08/25/2018 1154   CHOLHDL 3.8 08/25/2018 1154   CHOLHDL 4 01/28/2018 1624   VLDL 44.6 (H) 01/28/2018 1624   LDLCALC 35 08/25/2018 1154   LDLDIRECT 35.0 01/28/2018 1624      Wt Readings from Last 3 Encounters:  09/01/18 274 lb 12.8 oz (124.6 kg)  08/26/18 278 lb (126.1 kg)  08/25/18 278 lb 6.4 oz (126.3 kg)      Other studies Reviewed: Study Highlights     Nuclear stress EF: 28%.  The left ventricular ejection fraction is severely decreased (<30%).  There was no ST segment deviation noted during stress.  Findings consistent with prior myocardial infarction.  This is an intermediate risk study.   1. EF 28%, diffuse hypokinesis.  2. Fixed medium-sized, moderate intensity basal to apical inferior and basal to mid inferolateral perfusion defect.  Suspect infarction without significant ischemia.    Echocardiogram 04/27/2018 Left ventricle: The cavity size was moderately dilated. There was   moderate concentric hypertrophy. Systolic function was mildly to   moderately reduced. The estimated ejection fraction was in the   range of 40% to 45%. Diffuse hypokinesis. Doppler parameters are   consistent with abnormal left ventricular relaxation (grade 1   diastolic dysfunction). Doppler parameters are consistent with   indeterminate ventricular filling pressure. - Aortic valve: Transvalvular velocity was within the normal range.   There was no stenosis. There was no regurgitation. - Mitral valve: There was trivial  regurgitation. - Left atrium: The atrium was severely dilated. - Right ventricle: The cavity size was normal. Wall thickness was   normal. Systolic function was normal. - Tricuspid valve: There was trivial regurgitation. - Pulmonary arteries: Systolic pressure was within the normal   range. PA peak pressure: 21 mm Hg (S).  ASSESSMENT AND PLAN:  1. Dyspnea: Multifactorial in the setting of frequent ventricular ectopy, OSA non-compliant with CPAP, and COPD. His stress test  is negative for new areas of ischemia, echo revealed improved EF to 45% compared to EF on NM study.   2. Hypertension: He brought his BP cuff from home along with home BP recordings. His cuff is too small for his arm with home BP machine and does not correlate with our office BP. He is advised to get a larger cuff to better accuracy.   3. Frequent ventricular ectopy: I will change him to a cardio-specific BB which is tolerated in lung disease. I will stop coreg and begin bisoprolol 10 mg daily to aid in PVC;s and allow for BP control. He will follow up for BP check and continue to record BP at home with better cuff.   4. COPD: He is not using the neb tx as directed. Has not used the machine in several months.  I have recommended that he begin using as directed to improve his breathing status. He will need referral to pulmonary.  5. OSA:  Non-compliant with CPAP. May be contributing to dyspnea.   Current medicines are reviewed at length with the patient today.    Labs/ tests ordered today include: None   Phill Myron. West Pugh, ANP, AACC   09/01/2018 4:47 PM    [facility] [address], [place], [place] [address] Phone: [phone]; Fax: [phone]

## 2018-09-01 ENCOUNTER — Encounter: Payer: Self-pay | Admitting: Adult Health

## 2018-09-01 ENCOUNTER — Ambulatory Visit (INDEPENDENT_AMBULATORY_CARE_PROVIDER_SITE_OTHER): Payer: BLUE CROSS/BLUE SHIELD | Admitting: Adult Health

## 2018-09-01 VITALS — BP 122/70 | HR 98 | Ht 69.0 in | Wt 274.8 lb

## 2018-09-01 DIAGNOSIS — I493 Ventricular premature depolarization: Secondary | ICD-10-CM

## 2018-09-01 DIAGNOSIS — I2111 ST elevation (STEMI) myocardial infarction involving right coronary artery: Secondary | ICD-10-CM

## 2018-09-01 MED ORDER — BISOPROLOL FUMARATE 10 MG PO TABS: 10.0000 mg | ORAL_TABLET | Freq: Every day | ORAL | 3 refills | Status: DC

## 2018-09-01 NOTE — Patient Instructions (Signed)
Curt Bears NP has recommended you make the following change in your medication:  -- STOP carvedilol  -- START bisoprolol 10mg  daily  Curt Bears NP recommends that you schedule a follow-up appointment with Dr. Percival Spanish (first available)

## 2018-10-12 DIAGNOSIS — E119 Type 2 diabetes mellitus without complications: Secondary | ICD-10-CM | POA: Diagnosis not present

## 2018-10-12 DIAGNOSIS — H524 Presbyopia: Secondary | ICD-10-CM | POA: Diagnosis not present

## 2018-10-12 LAB — HM DIABETES EYE EXAM

## 2018-10-12 NOTE — Progress Notes (Signed)
Cardiology Office Note   Date:  10/13/2018   ID:  Deuntae, Kocsis 1956/09/27, MRN [id number]  PCP:  Venia Carbon, MD  Cardiologist:   No primary care provider on file.   Chief Complaint  Patient presents with  . Coronary Artery Disease      History of Present Illness: [name] is a 62 y.o. male who presents for  follow up of CAD.   In 2012 he had a stress test indicating anterior ischemia mild but from base to apex.  I sent him for a cath demonstrating LAD a 60-70% stenosis, moderate size second diagonal with 50% stenosis, circumflex AV groove 75-80% stenosis with a branching obtuse marginal with a superior branch subtotal stenosis followed by diffuse disease, right coronary artery had nonobstructive disease.  EF was 35%.   He was managed medically.  He has had an ICD.  However, when he had ERI Dr. Lovena Le elected not to replace the device. He had a recall lead and it was removed.  In Jan he had an inferior MI.    He underwent urgent DES to to the mid RCA and occluded left circumflex.    EF on echo was 40 - 45%.  He had some residual disease in the LAD and the plan was for out patient stress testing with PCI as needed.   On follow up in Sept he had increased dyspnea but no new ischemia on follow up [place]..  The EF was thought to be multifactorial.  He was managed clinically.    Since I last saw him he has had no new cardiovascular symptoms.  He has chronic dyspnea.  He has not had to take any nitroglycerin since he was seen however.  He denies any chest pressure, neck or arm discomfort.  He has had no palpitations, presyncope or syncope.  He is limited by back pain.  He has his chronic multifactorial dyspnea.  He is very sedentary.  He says some days he does need to get a bed.  He has poor dietary habits.  Past Medical History:  Diagnosis Date  . Arthritis   . Automatic implantable cardioverter-defibrillator in situ   . Benign essential HTN 09/17/2016  . COPD  (chronic obstructive pulmonary disease) ([place])    emphysema by CXR  . Coronary artery disease     LAD a 60-70% stenosis, moderate size second diagonal with 50% stenosis, circumflex AV groove 75-80% stenosis with a branching obtuse marginal with a superior branch subtotal stenosis followed by diffuse disease, right coronary artery  had nonobstructive disease.  EF was 35%  . Diabetes mellitus    type 2  no meds  . Diverticulosis of colon   . GERD (gastroesophageal reflux disease)    with esophagitis  . History of colonic polyps   . Hypertension   . Non-ischemic cardiomyopathy ([place])   . Presence of permanent cardiac pacemaker   . PVD (peripheral vascular disease) ([place])    bilateral common iliac artery aneurysms. Right SFA occlusion over a long segment. Left SFA disease with occlusion of the left TP trunk. Artirogram Oct. 2006  . Shortness of breath    exertion  . Sleep apnea   . Tobacco abuse   . Urinary incontinence    detrussor instability    Past Surgical History:  Procedure Laterality Date  . ABDOMINAL AORTIC ENDOVASCULAR STENT GRAFT N/A 05/07/2018   Procedure: ABDOMINAL AORTIC ENDOVASCULAR STENT GRAFT;  Surgeon: Conrad [place], MD;  Location:  [place] [facility];  Service: Vascular;  Laterality: N/A;  . CARDIAC CATHETERIZATION  06/2004   negative  . COLONOSCOPY  04/2005  . COPD exacerbation    . CORONARY ANGIOPLASTY  01/09/2018  . CORONARY STENT INTERVENTION N/A 01/10/2018   Procedure: CORONARY STENT INTERVENTION;  Surgeon: Lorretta Harp, MD;  Location: [place] [facility];  Service: Cardiovascular;  Laterality: N/A;  . CORONARY/GRAFT ACUTE MI REVASCULARIZATION N/A 01/10/2018   Procedure: Coronary/Graft Acute MI Revascularization;  Surgeon: Lorretta Harp, MD;  Location: [place] [facility];  Service: Cardiovascular;  Laterality: N/A;  . EMBOLIZATION Left 03/19/2018  . EMBOLIZATION Left 03/19/2018   Procedure: EMBOLIZATION - Left Internal;  Surgeon: Conrad Dante, MD;  Location: [place]  [facility];  Service: Cardiovascular;  Laterality: Left;  . EMBOLIZATION Right 04/16/2018   Procedure: EMBOLIZATION;  Surgeon: Conrad [place], MD;  Location: [place] [facility];  Service: Cardiovascular;  Laterality: Right;  . FEMORAL ARTERY EXPLORATION Right 05/07/2018   Procedure: FEMORAL ARTERY EXPLORATION, EXTENDED PROFUNDAPLASTY;  Surgeon: Conrad [place], MD;  Location: [place];  Service: Vascular;  Laterality: Right;  . INTRAOPERATIVE ARTERIOGRAM Right 05/07/2018   Procedure: INTRA OPERATIVE ARTERIOGRAM;  Surgeon: Conrad [place], MD;  Location: [place];  Service: Vascular;  Laterality: Right;  . LEFT HEART CATH AND CORONARY ANGIOGRAPHY N/A 01/10/2018   Procedure: LEFT HEART CATH AND CORONARY ANGIOGRAPHY;  Surgeon: Lorretta Harp, MD;  Location: [place] [facility];  Service: Cardiovascular;  Laterality: N/A;  . PACEMAKER INSERTION  11/2005  . PACEMAKER LEAD REMOVAL N/A 10/17/2014   Procedure: PACEMAKER LEAD REMOVAL;  Surgeon: Evans Lance, MD;  Location: [place];  Service: Cardiovascular;  Laterality: N/A;  . RENAL ANGIOGRAPHY Left 04/16/2018   Procedure: RENAL ANGIOGRAPHY;  Surgeon: Conrad [place], MD;  Location: [place] [facility];  Service: Cardiovascular;  Laterality: Left;  . s/p ICD placement      Medtronic Maximo [phone] single chamber     Current Outpatient Medications  Medication Sig Dispense Refill  . albuterol (PROVENTIL HFA;VENTOLIN HFA) 108 (90 Base) MCG/ACT inhaler Inhale 2 puffs into the lungs every 6 (six) hours as needed for wheezing or shortness of breath. 1 Inhaler 3  . atorvastatin (LIPITOR) 80 MG tablet Take 1 tablet (80 mg total) by mouth daily at 6 PM. 90 tablet 3  . bisoprolol (ZEBETA) 10 MG tablet Take 1 tablet (10 mg total) by mouth daily. 90 tablet 3  . clopidogrel (PLAVIX) 75 MG tablet Take 1 tablet (75 mg total) by mouth daily. 30 tablet 11  . metFORMIN (GLUCOPHAGE-XR) 500 MG 24 hr tablet Take 2 tablets (1,000 mg total) by mouth daily with breakfast. 60 tablet 11  .  multivitamin (THERAGRAN) per tablet Take 1 tablet by mouth daily.     . nitroGLYCERIN (NITROSTAT) 0.4 MG SL tablet Place 1 tablet (0.4 mg total) under the tongue every 5 (five) minutes as needed. (Patient taking differently: Place 0.4 mg under the tongue every 5 (five) minutes as needed for chest pain. ) 25 tablet 2  . spironolactone (ALDACTONE) 25 MG tablet Take 0.5 tablets (12.5 mg total) by mouth daily. 45 tablet 3  . tiotropium (SPIRIVA) 18 MCG inhalation capsule Place 1 capsule (18 mcg total) into inhaler and inhale daily. 30 capsule 11  . vitamin B-12 (CYANOCOBALAMIN) 1000 MCG tablet Take 1,000 mcg by mouth daily.     Marland Kitchen losartan (COZAAR) 25 MG tablet Take 1 tablet (25 mg total) by mouth daily. 90 tablet  3   No current facility-administered medications for this visit.     Allergies:   Patient has no known allergies.    ROS:  Please see the history of present illness.   Otherwise, review of systems are positive for none.   All other systems are reviewed and negative.    PHYSICAL EXAM: VS:  BP 126/78 (BP Location: Right Arm, Patient Position: Sitting, Cuff Size: Normal)   Pulse 60   Ht 5\' 9"  (1.753 m)   Wt 283 lb (128.4 kg)   SpO2 97%   BMI 41.79 kg/m  , BMI Body mass index is 41.79 kg/m. GENERAL:  Well appearing NECK:  No jugular venous distention, waveform within normal limits, carotid upstroke brisk and symmetric, no bruits, no thyromegaly LUNGS:  Clear to auscultation bilaterally CHEST:  Well healed ICD pocket.   HEART:  PMI not displaced or sustained,S1 and S2 within normal limits, no S3, no S4, no clicks, no rubs, no murmurs ABD:  Flat, positive bowel sounds normal in frequency in pitch, no bruits, no rebound, no guarding, no midline pulsatile mass, no hepatomegaly, no splenomegaly EXT:  2 plus pulses throughout, no edema, no cyanosis no clubbing   EKG:  EKG is not ordered today.    Recent Labs: 01/09/2018: B Natriuretic Peptide 26.1 05/08/2018: Magnesium 1.5 08/25/2018:  ALT 22; BUN 9; Creatinine, Ser 0.72; Hemoglobin 14.2; Platelets 264; Potassium 4.3; Sodium 139; TSH 2.460    Lipid Panel    Component Value Date/Time   CHOL 156 08/25/2018 1154   TRIG 400 (H) 08/25/2018 1154   TRIG 575 (HH) 01/02/2007 0000   HDL 41 08/25/2018 1154   CHOLHDL 3.8 08/25/2018 1154   CHOLHDL 4 01/28/2018 1624   VLDL 44.6 (H) 01/28/2018 1624   LDLCALC 35 08/25/2018 1154   LDLDIRECT 35.0 01/28/2018 1624      Wt Readings from Last 3 Encounters:  10/13/18 283 lb (128.4 kg)  09/01/18 274 lb 12.8 oz (124.6 kg)  08/26/18 278 lb (126.1 kg)      Other studies Reviewed: Additional studies/ records that were reviewed today include: PVD. Review of the above records demonstrates:  Please see elsewhere in the note.     ASSESSMENT AND PLAN:  CAD:  The patient has no new sypmtoms.  No further cardiovascular testing is indicated.  We will continue with aggressive risk reduction and meds as listed.  DYSPNEA:  This was thought to be related to multiple factors including COPD, obesity, deconditioning and chronic systolic and diastolic HF. No change in therapy.    HTN:  The blood pressure is at target. No change in medications is indicated. We will continue with therapeutic lifestyle changes (TLC).   PVD:   He had repair of aortic aneurysms earlier this year.   He is to get  EVAR duplex and ABI and we checked with [facility] and he is in the recall list.   DYSLIPIDEMIA:   He will remain on the meds as listed.  LDL is 35.    CARDIOMYOPATHY:  EF is mildly reduced.   No change in therapy.     PVCs:  He does not feel these.  No change in therapy.  BACK PAIN:  This is his biggest limitation and he has follow with Venia Carbon, MD in Dec and I asked him to talk about this.     Current medicines are reviewed at length with the patient today.  The patient does not have concerns regarding medicines.  The following changes  have been made:  no change  Labs/ tests ordered today include:  None No orders of the defined types were placed in this encounter.    Disposition:   FU with APP in six months.     Signed, Minus Breeding, MD  10/13/2018 3:57 PM    [place]

## 2018-10-13 ENCOUNTER — Encounter: Payer: Self-pay | Admitting: Internal Medicine

## 2018-10-13 ENCOUNTER — Ambulatory Visit (INDEPENDENT_AMBULATORY_CARE_PROVIDER_SITE_OTHER): Payer: BLUE CROSS/BLUE SHIELD | Admitting: Cardiology

## 2018-10-13 ENCOUNTER — Encounter: Payer: Self-pay | Admitting: Cardiology

## 2018-10-13 VITALS — BP 126/78 | HR 60 | Ht 69.0 in | Wt 283.0 lb

## 2018-10-13 DIAGNOSIS — I493 Ventricular premature depolarization: Secondary | ICD-10-CM | POA: Insufficient documentation

## 2018-10-13 DIAGNOSIS — I10 Essential (primary) hypertension: Secondary | ICD-10-CM | POA: Diagnosis not present

## 2018-10-13 DIAGNOSIS — I739 Peripheral vascular disease, unspecified: Secondary | ICD-10-CM | POA: Diagnosis not present

## 2018-10-13 DIAGNOSIS — I2111 ST elevation (STEMI) myocardial infarction involving right coronary artery: Secondary | ICD-10-CM

## 2018-10-13 DIAGNOSIS — R0602 Shortness of breath: Secondary | ICD-10-CM | POA: Diagnosis not present

## 2018-10-13 DIAGNOSIS — I2510 Atherosclerotic heart disease of native coronary artery without angina pectoris: Secondary | ICD-10-CM | POA: Diagnosis not present

## 2018-10-13 NOTE — Patient Instructions (Signed)
Medication Instructions:  Continue current medications  If you need a refill on your cardiac medications before your next appointment, please call your pharmacy.  Labwork: None Ordered   If you have labs (blood work) drawn today and your tests are completely normal, you will receive your results only by: Marland Kitchen MyChart Message (if you have MyChart) OR . A paper copy in the mail If you have any lab test that is abnormal or we need to change your treatment, we will call you to review the results.  Testing/Procedures: None ordered   Follow-Up: You will need a follow up appointment in 6 Months   Please call our office 2 months in advance([phone]) to schedule the appointment.  You may see  DR Percival Spanish or one of the following Advanced Practice Providers on your designated Care Team:   . Jory Sims, DNP, ANP . Rhonda Barrett, PA-C .  Marland Kitchen Kerin Ransom, PA-C . Daleen Snook Kroeger, PA-C . Sande Rives, PA-C .  Marland Kitchen Almyra Deforest, PA-C . Fabian Sharp, PA-C  At [facility], you and your health needs are our priority.  As part of our continuing mission to provide you with exceptional heart care, we have created designated Provider Care Teams.  These Care Teams include your primary Cardiologist (physician) and Advanced Practice Providers (APPs -  Physician Assistants and Nurse Practitioners) who all work together to provide you with the care you need, when you need it.   Thank you for choosing [facility] at [facility]!!

## 2018-11-01 ENCOUNTER — Other Ambulatory Visit: Payer: Self-pay | Admitting: Internal Medicine

## 2018-12-04 ENCOUNTER — Ambulatory Visit (INDEPENDENT_AMBULATORY_CARE_PROVIDER_SITE_OTHER): Payer: BLUE CROSS/BLUE SHIELD | Admitting: Internal Medicine

## 2018-12-04 ENCOUNTER — Encounter: Payer: Self-pay | Admitting: Internal Medicine

## 2018-12-04 VITALS — BP 140/82 | HR 60 | Temp 97.5°F | Ht 70.0 in | Wt 283.0 lb

## 2018-12-04 DIAGNOSIS — I25119 Atherosclerotic heart disease of native coronary artery with unspecified angina pectoris: Secondary | ICD-10-CM | POA: Diagnosis not present

## 2018-12-04 DIAGNOSIS — E1151 Type 2 diabetes mellitus with diabetic peripheral angiopathy without gangrene: Secondary | ICD-10-CM

## 2018-12-04 DIAGNOSIS — M545 Low back pain, unspecified: Secondary | ICD-10-CM

## 2018-12-04 DIAGNOSIS — F39 Unspecified mood [affective] disorder: Secondary | ICD-10-CM

## 2018-12-04 DIAGNOSIS — Z0000 Encounter for general adult medical examination without abnormal findings: Secondary | ICD-10-CM | POA: Diagnosis not present

## 2018-12-04 DIAGNOSIS — Z23 Encounter for immunization: Secondary | ICD-10-CM

## 2018-12-04 DIAGNOSIS — Z1211 Encounter for screening for malignant neoplasm of colon: Secondary | ICD-10-CM

## 2018-12-04 LAB — POCT GLYCOSYLATED HEMOGLOBIN (HGB A1C): HEMOGLOBIN A1C: 8 % — AB (ref 4.0–5.6)

## 2018-12-04 LAB — HM DIABETES FOOT EXAM

## 2018-12-04 NOTE — Progress Notes (Signed)
Subjective:    Patient ID: [name], male    DOB: [birth year], 62 y.o.   MRN: [id number]  HPI Here for physical  Back still bad, joints hurt Thinks his back is worse Really bad with any walking--- low back and into hips--then has to stop and feels like he will fall  Aneurysms have been fixed Needs to have other leg surgeries (?circulation based)  Rarely checks sugars Last week was 228--but not fasting Not always compliant Weight continues to rise  Breathing is about the same Uses the albuterol 2-3 times a day spiriva also Occasional chest pain---better with rest  Current Outpatient Medications on File Prior to Visit  Medication Sig Dispense Refill  . atorvastatin (LIPITOR) 80 MG tablet Take 1 tablet (80 mg total) by mouth daily at 6 PM. 90 tablet 3  . bisoprolol (ZEBETA) 10 MG tablet Take 1 tablet (10 mg total) by mouth daily. 90 tablet 3  . clopidogrel (PLAVIX) 75 MG tablet Take 1 tablet (75 mg total) by mouth daily. 30 tablet 11  . losartan (COZAAR) 25 MG tablet Take 1 tablet (25 mg total) by mouth daily. 90 tablet 3  . metFORMIN (GLUCOPHAGE-XR) 500 MG 24 hr tablet Take 2 tablets (1,000 mg total) by mouth daily with breakfast. 60 tablet 11  . multivitamin (THERAGRAN) per tablet Take 1 tablet by mouth daily.     . nitroGLYCERIN (NITROSTAT) 0.4 MG SL tablet Place 1 tablet (0.4 mg total) under the tongue every 5 (five) minutes as needed. (Patient taking differently: Place 0.4 mg under the tongue every 5 (five) minutes as needed for chest pain. ) 25 tablet 2  . spironolactone (ALDACTONE) 25 MG tablet Take 0.5 tablets (12.5 mg total) by mouth daily. 45 tablet 3  . tiotropium (SPIRIVA) 18 MCG inhalation capsule Place 1 capsule (18 mcg total) into inhaler and inhale daily. 30 capsule 11  . VENTOLIN HFA 108 (90 Base) MCG/ACT inhaler TAKE 2 PUFFS BY MOUTH EVERY 6 HOURS AS NEEDED FOR WHEEZE OR SHORTNESS OF BREATH *USE W/ SPACER* 18 Inhaler 3  . vitamin B-12 (CYANOCOBALAMIN) 1000  MCG tablet Take 1,000 mcg by mouth daily.      No current facility-administered medications on file prior to visit.     No Known Allergies  Past Medical History:  Diagnosis Date  . Arthritis   . Automatic implantable cardioverter-defibrillator in situ   . Benign essential HTN 09/17/2016  . COPD (chronic obstructive pulmonary disease) ([place])    emphysema by CXR  . Coronary artery disease     LAD a 60-70% stenosis, moderate size second diagonal with 50% stenosis, circumflex AV groove 75-80% stenosis with a branching obtuse marginal with a superior branch subtotal stenosis followed by diffuse disease, right coronary artery  had nonobstructive disease.  EF was 35%  . Diabetes mellitus    type 2  no meds  . Diverticulosis of colon   . GERD (gastroesophageal reflux disease)    with esophagitis  . History of colonic polyps   . Hypertension   . Non-ischemic cardiomyopathy ([place])   . Presence of permanent cardiac pacemaker   . PVD (peripheral vascular disease) ([place])    bilateral common iliac artery aneurysms. Right SFA occlusion over a long segment. Left SFA disease with occlusion of the left TP trunk. Artirogram Oct. 2006  . Shortness of breath    exertion  . Sleep apnea   . Tobacco abuse   . Urinary incontinence    detrussor instability  Past Surgical History:  Procedure Laterality Date  . ABDOMINAL AORTIC ENDOVASCULAR STENT GRAFT N/A 05/07/2018   Procedure: ABDOMINAL AORTIC ENDOVASCULAR STENT GRAFT;  Surgeon: Conrad Fairmount, MD;  Location: [facility];  Service: Vascular;  Laterality: N/A;  . CARDIAC CATHETERIZATION  06/2004   negative  . COLONOSCOPY  04/2005  . COPD exacerbation    . CORONARY ANGIOPLASTY  01/09/2018  . CORONARY STENT INTERVENTION N/A 01/10/2018   Procedure: CORONARY STENT INTERVENTION;  Surgeon: Lorretta Harp, MD;  Location: [facility];  Service: Cardiovascular;  Laterality: N/A;  . CORONARY/GRAFT ACUTE MI REVASCULARIZATION N/A 01/10/2018   Procedure:  Coronary/Graft Acute MI Revascularization;  Surgeon: Lorretta Harp, MD;  Location: [facility];  Service: Cardiovascular;  Laterality: N/A;  . EMBOLIZATION Left 03/19/2018  . EMBOLIZATION Left 03/19/2018   Procedure: EMBOLIZATION - Left Internal;  Surgeon: Conrad [place], MD;  Location: [facility];  Service: Cardiovascular;  Laterality: Left;  . EMBOLIZATION Right 04/16/2018   Procedure: EMBOLIZATION;  Surgeon: Conrad Tyrone, MD;  Location: [facility];  Service: Cardiovascular;  Laterality: Right;  . FEMORAL ARTERY EXPLORATION Right 05/07/2018   Procedure: FEMORAL ARTERY EXPLORATION, EXTENDED PROFUNDAPLASTY;  Surgeon: Conrad [place]iapolis, MD;  Location: [facility];  Service: Vascular;  Laterality: Right;  . INTRAOPERATIVE ARTERIOGRAM Right 05/07/2018   Procedure: INTRA OPERATIVE ARTERIOGRAM;  Surgeon: Conrad Knox, MD;  Location: [facility];  Service: Vascular;  Laterality: Right;  . LEFT HEART CATH AND CORONARY ANGIOGRAPHY N/A 01/10/2018   Procedure: LEFT HEART CATH AND CORONARY ANGIOGRAPHY;  Surgeon: Lorretta Harp, MD;  Location: [facility];  Service: Cardiovascular;  Laterality: N/A;  . PACEMAKER INSERTION  11/2005  . PACEMAKER LEAD REMOVAL N/A 10/17/2014   Procedure: PACEMAKER LEAD REMOVAL;  Surgeon: Evans Lance, MD;  Location: [facility];  Service: Cardiovascular;  Laterality: N/A;  . RENAL ANGIOGRAPHY Left 04/16/2018   Procedure: RENAL ANGIOGRAPHY;  Surgeon: Conrad , MD;  Location: [facility];  Service: Cardiovascular;  Laterality: Left;  . s/p ICD placement      [employer] Maximo [phone] single chamber    Family History  Problem Relation Age of Onset  . Stroke Father   . Coronary artery disease Maternal Aunt   . Heart failure Maternal Aunt   . Lung cancer Maternal Aunt   . Lung cancer Maternal Uncle   . Cancer Brother        Mouth  . Cancer Sister        throat    Social History   Socioeconomic History  . Marital status: Married    Spouse name:  Not on file  . Number of children: 2  . Years of education: Not on file  . Highest education level: Not on file  Occupational History  . Occupation: Grave digger--now disabled  Social Needs  . Financial resource strain: Not on file  . Food insecurity:    Worry: Not on file    Inability: Not on file  . Transportation needs:    Medical: Not on file    Non-medical: Not on file  Tobacco Use  . Smoking status: Former Smoker    Packs/day: 2.00    Years: 15.00    Pack years: 30.00    Types: Cigarettes    Last attempt to quit: 01/09/2018    Years since quitting: 0.9  . Smokeless tobacco: Never Used  . Tobacco comment: GAVE 1-800-QUIT-NOW  Substance and Sexual Activity  .  Alcohol use: No  . Drug use: No  . Sexual activity: Never  Lifestyle  . Physical activity:    Days per week: Not on file    Minutes per session: Not on file  . Stress: Not on file  Relationships  . Social connections:    Talks on phone: Not on file    Gets together: Not on file    Attends religious service: Not on file    Active member of club or organization: Not on file    Attends meetings of clubs or organizations: Not on file    Relationship status: Not on file  . Intimate partner violence:    Fear of current or ex partner: Not on file    Emotionally abused: Not on file    Physically abused: Not on file    Forced sexual activity: Not on file  Other Topics Concern  . Not on file  Social History Narrative   No living will   Requests wife as health care POA   Would accept resuscitation but doesn't want prolonged ventilation   No tube feeds if cognitively unaware   Review of Systems  Constitutional: Positive for unexpected weight change.       Wears seat belt  HENT:       Edentulous No dentures Hearing aides--satisfied  Eyes: Negative for visual disturbance.       Recent eye exam  Respiratory: Positive for shortness of breath. Negative for cough.   Cardiovascular: Positive for chest pain.  Negative for palpitations and leg swelling.  Gastrointestinal: Negative for blood in stool and constipation.  Endocrine: Positive for polydipsia and polyuria.  Genitourinary: Positive for frequency. Negative for difficulty urinating.  Musculoskeletal: Positive for arthralgias and back pain.  Skin: Negative for rash.       No suspicious lesions  Allergic/Immunologic: Negative for environmental allergies and immunocompromised state.  Neurological: Positive for dizziness and headaches. Negative for syncope.  Hematological: Negative for adenopathy. Bruises/bleeds easily.  Psychiatric/Behavioral:       Sleep apnea--doesn't use his machine (claustrophobic but even nasal prongs intolerable) Ongoing depressed mood Anxious in car at times--nothing persistent       Objective:   Physical Exam  Constitutional: He appears well-developed. No distress.  HENT:  Head: Normocephalic and atraumatic.  Right Ear: External ear normal.  Left Ear: External ear normal.  Mouth/Throat: Oropharynx is clear and moist. No oropharyngeal exudate.  Eyes: Pupils are equal, round, and reactive to light. Conjunctivae are normal.  Neck: No thyromegaly present.  Cardiovascular: Normal rate, regular rhythm and normal heart sounds. Exam reveals no gallop.  No murmur heard. Feet warm but no palpable pulses   Respiratory: Effort normal. No respiratory distress. He has no wheezes. He has no rales.  Decreased breath sounds but clear  GI: Soft. There is no abdominal tenderness.  Musculoskeletal:        General: No tenderness or edema.  Lymphadenopathy:    He has no cervical adenopathy.  Neurological:  Decreased sensation in feet  Skin: No rash noted. No erythema.  No foot lesions  Psychiatric: He has a normal mood and affect. His behavior is normal.           Assessment & Plan:

## 2018-12-04 NOTE — Assessment & Plan Note (Signed)
Chronic dysthymia No Rx for this

## 2018-12-04 NOTE — Assessment & Plan Note (Signed)
Unable to exercise Discussed dietary restraint to lose weight Tdap and flu vaccines today Will do FIT No PSA after discussion

## 2018-12-04 NOTE — Assessment & Plan Note (Signed)
Stable angina pattern No changes

## 2018-12-04 NOTE — Assessment & Plan Note (Signed)
Worse Will set up with physiatry

## 2018-12-04 NOTE — Assessment & Plan Note (Addendum)
Hopefully still acceptable control Lab Results  Component Value Date   HGBA1C 8.0 (A) 12/04/2018   Acceptable but should be better if he is careful with his eating

## 2018-12-23 ENCOUNTER — Other Ambulatory Visit: Payer: Self-pay

## 2018-12-23 ENCOUNTER — Emergency Department (HOSPITAL_COMMUNITY): Payer: BLUE CROSS/BLUE SHIELD

## 2018-12-23 ENCOUNTER — Encounter (HOSPITAL_COMMUNITY): Payer: Self-pay | Admitting: *Deleted

## 2018-12-23 ENCOUNTER — Emergency Department (HOSPITAL_COMMUNITY)
Admission: EM | Admit: 2018-12-23 | Discharge: 2018-12-24 | Disposition: A | Discharge status: Home / Self Care | Payer: BLUE CROSS/BLUE SHIELD | Attending: Emergency Medicine | Admitting: Emergency Medicine

## 2018-12-23 DIAGNOSIS — Z95810 Presence of automatic (implantable) cardiac defibrillator: Secondary | ICD-10-CM | POA: Diagnosis not present

## 2018-12-23 DIAGNOSIS — I10 Essential (primary) hypertension: Secondary | ICD-10-CM | POA: Diagnosis not present

## 2018-12-23 DIAGNOSIS — J449 Chronic obstructive pulmonary disease, unspecified: Secondary | ICD-10-CM | POA: Insufficient documentation

## 2018-12-23 DIAGNOSIS — I2510 Atherosclerotic heart disease of native coronary artery without angina pectoris: Secondary | ICD-10-CM | POA: Diagnosis not present

## 2018-12-23 DIAGNOSIS — Z79899 Other long term (current) drug therapy: Secondary | ICD-10-CM | POA: Insufficient documentation

## 2018-12-23 DIAGNOSIS — R0602 Shortness of breath: Secondary | ICD-10-CM | POA: Diagnosis not present

## 2018-12-23 DIAGNOSIS — R05 Cough: Secondary | ICD-10-CM | POA: Insufficient documentation

## 2018-12-23 DIAGNOSIS — Z87891 Personal history of nicotine dependence: Secondary | ICD-10-CM | POA: Diagnosis not present

## 2018-12-23 DIAGNOSIS — R0789 Other chest pain: Secondary | ICD-10-CM | POA: Insufficient documentation

## 2018-12-23 DIAGNOSIS — R0981 Nasal congestion: Secondary | ICD-10-CM | POA: Diagnosis not present

## 2018-12-23 DIAGNOSIS — R079 Chest pain, unspecified: Secondary | ICD-10-CM | POA: Diagnosis not present

## 2018-12-23 DIAGNOSIS — E119 Type 2 diabetes mellitus without complications: Secondary | ICD-10-CM | POA: Diagnosis not present

## 2018-12-23 LAB — TROPONIN I: Troponin I: <0.03 ng/mL (ref <0.03)

## 2018-12-23 LAB — CBC
HCT: 44.1 % (ref 39.0–52.0)
Hemoglobin: 15.3 g/dL (ref 13.0–17.0)
MCH: 28.8 pg (ref 26.0–34.0)
MCHC: 34.7 g/dL (ref 30.0–36.0)
MCV: 83.1 fL (ref 80.0–100.0)
Platelets: 298 10*3/uL (ref 150–400)
RBC: 5.31 MIL/uL (ref 4.22–5.81)
RDW: 13.4 % (ref 11.5–15.5)
WBC: 9.5 10*3/uL (ref 4.0–10.5)
nRBC: 0 % (ref 0.0–0.2)

## 2018-12-23 LAB — BASIC METABOLIC PANEL
Anion gap: 13 (ref 5–15)
BUN: 9 mg/dL (ref 8–23)
CO2: 24 mmol/L (ref 22–32)
Calcium: 9.7 mg/dL (ref 8.9–10.3)
Chloride: 101 mmol/L (ref 98–111)
Creatinine, Ser: 1.03 mg/dL (ref 0.61–1.24)
GFR calc Af Amer: >60 mL/min (ref >60)
GFR calc non Af Amer: >60 mL/min (ref >60)
Glucose, Bld: 144 mg/dL — ABNORMAL HIGH (ref 70–99)
Potassium: 4.5 mmol/L (ref 3.5–5.1)
Sodium: 138 mmol/L (ref 135–145)

## 2018-12-23 MED ORDER — SODIUM CHLORIDE 0.9 % IV BOLUS
500.0000 mL | Freq: Once | INTRAVENOUS | Status: AC
  Administered 2018-12-23: 500 mL via INTRAVENOUS

## 2018-12-23 MED ORDER — FLUTICASONE PROPIONATE 50 MCG/ACT NA SUSP: 2.0000 | Freq: Every day | NASAL | 0 refills | Status: DC

## 2018-12-23 MED ORDER — PREDNISONE 10 MG (21) PO TBPK: ORAL_TABLET | ORAL | 0 refills | Status: DC

## 2018-12-23 NOTE — Discharge Instructions (Addendum)
Follow-up with cardiology as soon as possible in this manner. Return to the ED for recurrence of chest pain, shortness of breath, or any other major complaints.  Prednisone: Take this medication until finished. Flonase: Use this medication for nasal congestion.

## 2018-12-23 NOTE — ED Provider Notes (Signed)
[facility]ERGENCY DEPARTMENT Provider Note   CSN: [id number] Arrival date & time: 12/23/18  1738     History   Chief Complaint Chief Complaint  Patient presents with  . Chest Pain  . Shortness of Breath    HPI [name] is a 63 y.o. male.  HPI   [name] is a 63 y.o. male, with a history of HTN, COPD, CAD, GERD, presenting to the ED with chest pain. This morning while at rest had a 5 minute episode of left sided, sharp, chest pain, severe, radiating down the left arm. Chest pain resolved and has not recurred. Left arm pain continued intermittently, is throbbing, radiates from the left shoulder to the left hand, moderate in intensity.  He also notes shortness of breath last night and nasal congestion, sore throat, and productive cough beginning two days ago. His only current complaint at the time of my interview is his intermittent left arm pain. Denies fever/chills, acute dizziness, syncope, diaphoresis, N/V/D, abdominal pain, numbness, weakness, lower extremity edema/pain, acute orthopnea, or any other complaints.    Past Medical History:  Diagnosis Date  . Arthritis   . Automatic implantable cardioverter-defibrillator in situ   . Benign essential HTN 09/17/2016  . COPD (chronic obstructive pulmonary disease) ([place])    emphysema by CXR  . Coronary artery disease     LAD a 60-70% stenosis, moderate size second diagonal with 50% stenosis, circumflex AV groove 75-80% stenosis with a branching obtuse marginal with a superior branch subtotal stenosis followed by diffuse disease, right coronary artery  had nonobstructive disease.  EF was 35%  . Diabetes mellitus    type 2  no meds  . Diverticulosis of colon   . GERD (gastroesophageal reflux disease)    with esophagitis  . History of colonic polyps   . Hypertension   . Non-ischemic cardiomyopathy ([place])   . Presence of permanent cardiac pacemaker   . PVD (peripheral vascular disease) ([place])    bilateral common iliac artery aneurysms. Right SFA occlusion over a long segment. Left SFA disease with occlusion of the left TP trunk. Artirogram Oct. 2006  . Shortness of breath    exertion  . Sleep apnea   . Tobacco abuse   . Urinary incontinence    detrussor instability    Patient Active Problem List   Diagnosis Date Noted  . PVD (peripheral vascular disease) ([place]) 10/13/2018  . PVC's (premature ventricular contractions) 10/13/2018  . Iliac artery occlusion ([facility]) 03/19/2018  . Aneurysm of iliac artery ([place]) 03/06/2018  . Status post coronary artery stent placement   . AAA (abdominal aortic aneurysm) without rupture ([place])   . Bilateral carotid artery stenosis 04/28/2017  . Low back pain 04/14/2017  . Cerumen impaction 11/25/2016  . Benign essential HTN 09/17/2016  . CAD (coronary artery disease), native coronary artery 09/17/2016  . Mood disorder ([place]) 11/29/2015  . Left leg swelling 05/15/2015  . COPD exacerbation ([place]) 12/15/2012  . Routine general medical examination at a health care facility 10/26/2012  . Atherosclerosis of native coronary artery with angina pectoris ([place]) 10/17/2011  . OSA (obstructive sleep apnea) 08/28/2011  . Obesity 08/27/2011  . Chronic systolic heart failure ([place]) 08/06/2011  . B12 DEFICIENCY 04/14/2009  . CAROTID BRUIT 02/16/2009  . Diabetic polyneuropathy ([place]) 12/07/2008  . URINARY INCONTINENCE 05/27/2008  . COPD (chronic obstructive pulmonary disease) with emphysema ([place]) 02/09/2008  . Hyperlipemia 10/13/2007  . Peripheral vascular disease in diabetes mellitus ([place]) 09/15/2007  .  DIVERTICULOSIS, COLON 09/15/2007  . COLONIC POLYPS, HX OF 09/15/2007  . Type 2 diabetes, controlled, with peripheral circulatory disorder ([place]) 05/25/2007  . GERD 05/25/2007  . REFLUX ESOPHAGITIS 04/23/2007    Past Surgical History:  Procedure Laterality Date  . ABDOMINAL AORTIC ENDOVASCULAR STENT GRAFT N/A 05/07/2018   Procedure: ABDOMINAL AORTIC ENDOVASCULAR  STENT GRAFT;  Surgeon: Conrad Orwin, MD;  Location: [facility];  Service: Vascular;  Laterality: N/A;  . CARDIAC CATHETERIZATION  06/2004   negative  . COLONOSCOPY  04/2005  . COPD exacerbation    . CORONARY ANGIOPLASTY  01/09/2018  . CORONARY STENT INTERVENTION N/A 01/10/2018   Procedure: CORONARY STENT INTERVENTION;  Surgeon: Lorretta Harp, MD;  Location: [facility];  Service: Cardiovascular;  Laterality: N/A;  . CORONARY/GRAFT ACUTE MI REVASCULARIZATION N/A 01/10/2018   Procedure: Coronary/Graft Acute MI Revascularization;  Surgeon: Lorretta Harp, MD;  Location: [facility];  Service: Cardiovascular;  Laterality: N/A;  . EMBOLIZATION Left 03/19/2018  . EMBOLIZATION Left 03/19/2018   Procedure: EMBOLIZATION - Left Internal;  Surgeon: Conrad Merced, MD;  Location: [facility];  Service: Cardiovascular;  Laterality: Left;  . EMBOLIZATION Right 04/16/2018   Procedure: EMBOLIZATION;  Surgeon: Conrad [place], MD;  Location: [facility];  Service: Cardiovascular;  Laterality: Right;  . FEMORAL ARTERY EXPLORATION Right 05/07/2018   Procedure: FEMORAL ARTERY EXPLORATION, EXTENDED PROFUNDAPLASTY;  Surgeon: Conrad Chaplin, MD;  Location: [facility];  Service: Vascular;  Laterality: Right;  . INTRAOPERATIVE ARTERIOGRAM Right 05/07/2018   Procedure: INTRA OPERATIVE ARTERIOGRAM;  Surgeon: Conrad [place]ifee, MD;  Location: [facility];  Service: Vascular;  Laterality: Right;  . LEFT HEART CATH AND CORONARY ANGIOGRAPHY N/A 01/10/2018   Procedure: LEFT HEART CATH AND CORONARY ANGIOGRAPHY;  Surgeon: Lorretta Harp, MD;  Location: [facility];  Service: Cardiovascular;  Laterality: N/A;  . PACEMAKER INSERTION  11/2005  . PACEMAKER LEAD REMOVAL N/A 10/17/2014   Procedure: PACEMAKER LEAD REMOVAL;  Surgeon: Evans Lance, MD;  Location: [facility];  Service: Cardiovascular;  Laterality: N/A;  . RENAL ANGIOGRAPHY Left 04/16/2018   Procedure: RENAL ANGIOGRAPHY;  Surgeon: Conrad [place], MD;   Location: [facility];  Service: Cardiovascular;  Laterality: Left;  . s/p ICD placement      [employer] Maximo [phone] single chamber        Home Medications    Prior to Admission medications   Medication Sig Start Date End Date Taking? Authorizing Provider  atorvastatin (LIPITOR) 80 MG tablet Take 1 tablet (80 mg total) by mouth daily at 6 PM. 01/21/18  Yes Hochrein, Jeneen Rinks, MD  bisoprolol (ZEBETA) 10 MG tablet Take 1 tablet (10 mg total) by mouth daily. 09/01/18  Yes Lendon Colonel, NP  clopidogrel (PLAVIX) 75 MG tablet Take 1 tablet (75 mg total) by mouth daily. 03/11/18  Yes Conrad [place]emah, MD  losartan (COZAAR) 25 MG tablet Take 1 tablet (25 mg total) by mouth daily. 01/21/18  Yes Minus Breeding, MD  metFORMIN (GLUCOPHAGE-XR) 500 MG 24 hr tablet Take 2 tablets (1,000 mg total) by mouth daily with breakfast. 05/12/18  Yes Venia Carbon, MD  multivitamin [facility]) per tablet Take 1 tablet by mouth daily.    Yes [provider]  nitroGLYCERIN (NITROSTAT) 0.4 MG SL tablet Place 1 tablet (0.4 mg total) under the tongue every 5 (five) minutes as needed. Patient taking differently: Place 0.4 mg under the tongue every 5 (five) minutes as needed  for chest pain.  01/13/18  Yes Cheryln Manly, NP  spironolactone (ALDACTONE) 25 MG tablet Take 0.5 tablets (12.5 mg total) by mouth daily. 01/21/18  Yes Minus Breeding, MD  tiotropium (SPIRIVA) 18 MCG inhalation capsule Place 1 capsule (18 mcg total) into inhaler and inhale daily. 07/21/18  Yes Venia Carbon, MD  vitamin B-12 (CYANOCOBALAMIN) 1000 MCG tablet Take 1,000 mcg by mouth daily.    Yes [provider]  fluticasone (FLONASE) 50 MCG/ACT nasal spray Place 2 sprays into both nostrils daily. 12/23/18   Milam Allbaugh C, PA-C  predniSONE (STERAPRED UNI-PAK 21 TAB) 10 MG (21) TBPK tablet Take 6 tabs (60mg ) day 1, 5 tabs (50mg ) day 2, 4 tabs (40mg ) day 3, 3 tabs (30mg ) day 4, 2 tabs (20mg ) day 5, and 1 tab (10mg ) day 6.  12/23/18   Savreen Gebhardt C, PA-C  VENTOLIN HFA 108 (90 Base) MCG/ACT inhaler TAKE 2 PUFFS BY MOUTH EVERY 6 HOURS AS NEEDED FOR WHEEZE OR SHORTNESS OF BREATH *USE W/ SPACER* 11/02/18   Venia Carbon, MD    Family History Family History  Problem Relation Age of Onset  . Stroke Father   . Coronary artery disease Maternal Aunt   . Heart failure Maternal Aunt   . Lung cancer Maternal Aunt   . Lung cancer Maternal Uncle   . Cancer Brother        Mouth  . Cancer Sister        throat    Social History Social History   Tobacco Use  . Smoking status: Former Smoker    Packs/day: 2.00    Years: 15.00    Pack years: 30.00    Types: Cigarettes    Last attempt to quit: 01/09/2018    Years since quitting: 0.9  . Smokeless tobacco: Never Used  . Tobacco comment: GAVE 1-800-QUIT-NOW  Substance Use Topics  . Alcohol use: No  . Drug use: No     Allergies   Patient has no known allergies.   Review of Systems Review of Systems  Constitutional: Negative for chills, diaphoresis and fever.  HENT: Positive for congestion.   Respiratory: Positive for cough and shortness of breath (last night).   Cardiovascular: Positive for chest pain (resolved). Negative for leg swelling.  Gastrointestinal: Negative for abdominal pain, diarrhea, nausea and vomiting.  Musculoskeletal: Negative for back pain.  Neurological: Negative for dizziness, syncope, weakness and numbness.  All other systems reviewed and are negative.    Physical Exam Updated Vital Signs BP 130/77 (BP Location: Right Wrist)   Pulse 64   Temp 97.8 F (36.6 C)   Resp 20   Ht 5\' 10"  (1.778 m)   Wt 129.3 kg   SpO2 96%   BMI 40.89 kg/m   Physical Exam Vitals signs and nursing note reviewed.  Constitutional:      General: He is not in acute distress.    Appearance: He is well-developed. He is not diaphoretic.  HENT:     Head: Normocephalic and atraumatic.  Eyes:     Conjunctiva/sclera: Conjunctivae normal.  Neck:      Musculoskeletal: Neck supple.  Cardiovascular:     Rate and Rhythm: Normal rate and regular rhythm.     Pulses:          Radial pulses are 2+ on the right side and 2+ on the left side.       Posterior tibial pulses are 2+ on the right side and 2+ on the left side.  Heart sounds: Normal heart sounds.  Pulmonary:     Effort: Pulmonary effort is normal. No respiratory distress.     Breath sounds: Normal breath sounds.  Abdominal:     Palpations: Abdomen is soft.     Tenderness: There is no abdominal tenderness. There is no guarding.  Musculoskeletal:     Right lower leg: No edema.     Left lower leg: No edema.     Comments: Full ROM in the left shoulder, elbow, and wrist.  No current pain in this extremity. No pain with ROM. No swelling, erythema, or increased warmth.  Lymphadenopathy:     Cervical: No cervical adenopathy.  Skin:    General: Skin is warm and dry.  Neurological:     Mental Status: He is alert.     Comments: Sensation grossly intact to light touch in the extremities. Strength 5/5 in all extremities. No gait disturbance. Coordination intact. Cranial nerves III-XII grossly intact. No facial droop.   Psychiatric:        Behavior: Behavior normal.      ED Treatments / Results  Labs (all labs ordered are listed, but only abnormal results are displayed) Labs Reviewed  BASIC METABOLIC PANEL - Abnormal; Notable for the following components:      Result Value   Glucose, Bld 144 (*)    All other components within normal limits  CBC  TROPONIN I  TROPONIN I    EKG EKG Interpretation  Date/Time:  Wednesday December 23 2018 17:44:36 EST Ventricular Rate:  67 PR Interval:  216 QRS Duration: 98 QT Interval:  408 QTC Calculation: 431 R Axis:   57 Text Interpretation:  Sinus rhythm with 1st degree A-V block Nonspecific ST and T wave abnormality Abnormal ECG No significant change since last tracing Confirmed by Wandra Arthurs [phone]) on 12/23/2018 6:56:41  PM   Radiology Dg Chest 2 View  Result Date: 12/23/2018 CLINICAL DATA:  Chest pain and shortness of breath EXAM: CHEST - 2 VIEW COMPARISON:  05/08/2018 FINDINGS: The heart size and mediastinal contours are within normal limits. Both lungs are clear. Mild degenerative changes of the spine. IMPRESSION: No active cardiopulmonary disease. Electronically Signed   By: Donavan Foil M.D.   On: 12/23/2018 19:01    Procedures Procedures (including critical care time)  Medications Ordered in ED Medications  sodium chloride 0.9 % bolus 500 mL (0 mLs Intravenous Stopped 12/23/18 2146)     Initial Impression / Assessment and Plan / ED Course  I have reviewed the triage vital signs and the nursing notes.  Pertinent labs & imaging results that were available during my care of the patient were reviewed by me and considered in my medical decision making (see chart for details).  Clinical Course as of Dec 24 32  Wed Dec 23, 2018  1949 BP cuff noted to be around the forearm.  BP: 109/61 [SJ]  2120 Patient continues to be symptom-free.   [SJ]    Clinical Course User Index [SJ] Trachelle Low C, PA-C    Patient presents with an episode of chest pain that occurred this morning. Patient is nontoxic appearing, afebrile, not tachycardic, not tachypneic, not hypotensive, maintains excellent SPO2 on room air, and is in no apparent distress.  No leukocytosis.  Lab results reassuring.  No acute abnormalities on chest x-ray.  Delta troponins negative.  Doubt dissection or PE, especially with complete resolution in the patient's symptoms without recurrence. Cardiology follow-up.  Return precautions discussed.  Patient voices understanding  of these instructions, accepts the plan, and is comfortable with discharge.  Findings and plan of care discussed with Shirlyn Goltz, MD.   Vitals:   12/23/18 2215 12/23/18 2230 12/23/18 2245 12/23/18 2300  BP: 129/68 137/71 128/77 126/61  Pulse: (!) 58 61 (!) 59 61  Resp: 20 16  20 20   Temp:      SpO2: 96% 94% 96% 96%  Weight:      Height:         Final Clinical Impressions(s) / ED Diagnoses   Final diagnoses:  Atypical chest pain    ED Discharge Orders         Ordered    predniSONE (STERAPRED UNI-PAK 21 TAB) 10 MG (21) TBPK tablet     12/23/18 2312    fluticasone (FLONASE) 50 MCG/ACT nasal spray  Daily     12/23/18 2312           Lorayne Bender, PA-C 12/24/18 0038    Drenda Freeze, MD 12/26/18 [phone]

## 2018-12-23 NOTE — ED Notes (Signed)
Patient verbalizes understanding of discharge instructions. Opportunity for questioning and answers were provided. Armband removed by staff, pt discharged from ED.  

## 2018-12-23 NOTE — ED Triage Notes (Signed)
The pt is c/o chest pain sob with dizziness since 0900am today  Poor hygiene

## 2018-12-27 NOTE — Progress Notes (Signed)
Cardiology Office Note   Date:  12/28/2018   ID:  Kainoa, Swoboda December 06, 1956, MRN [id number]  PCP:  Venia Carbon, MD  Cardiologist:  [facility]  Chief Complaint  Patient presents with  . Follow-up    ER Visit      History of Present Illness: [name] is a 63 y.o. male who presents for ongoing assessment and management of CAD. Most recent cath in 2012 revealed LAD a 60-70% stenosis, moderate size second diagonal with 50% stenosis, circumflex AV groove 75-80% stenosis with a branching obtuse marginal with a superior branch subtotal stenosis followed by diffuse disease, right coronary artery had nonobstructive disease. Repeat  EF was 35%. He has an ICD that is at [facility] and is not being replaced.   He had an inferior MI in 12/2017, requiring emergent DES to the mid RCA and occlude left Cx. EF of 40%-45%. He had some residual LAD disease in the LAD and was planned for stress test with PCI if needed. He is being treated medically. He also was noted to have an infrarenal AAA. He is followed by Dr. Bridgett Larsson and this has been repaired.  He also has history of COPD with chronic dyspnea and chest pressure. He was seen in the ED with chest pain, described as sharp, severe radiating down the left arm. Throbbing pain radiating from the left shoulder to the left hand lasting all day. He was ruled out for ACS. He was treated with a steroid dose pack.   Past Medical History:  Diagnosis Date  . Arthritis   . Automatic implantable cardioverter-defibrillator in situ   . Benign essential HTN 09/17/2016  . COPD (chronic obstructive pulmonary disease) ([place])    emphysema by CXR  . Coronary artery disease     LAD a 60-70% stenosis, moderate size second diagonal with 50% stenosis, circumflex AV groove 75-80% stenosis with a branching obtuse marginal with a superior branch subtotal stenosis followed by diffuse disease, right coronary artery  had nonobstructive disease.  EF was 35%  . Diabetes mellitus    type  2  no meds  . Diverticulosis of colon   . GERD (gastroesophageal reflux disease)    with esophagitis  . History of colonic polyps   . Hypertension   . Non-ischemic cardiomyopathy ([place])   . Presence of permanent cardiac pacemaker   . PVD (peripheral vascular disease) ([place])    bilateral common iliac artery aneurysms. Right SFA occlusion over a long segment. Left SFA disease with occlusion of the left TP trunk. Artirogram Oct. 2006  . Shortness of breath    exertion  . Sleep apnea   . Tobacco abuse   . Urinary incontinence    detrussor instability    Past Surgical History:  Procedure Laterality Date  . ABDOMINAL AORTIC ENDOVASCULAR STENT GRAFT N/A 05/07/2018   Procedure: ABDOMINAL AORTIC ENDOVASCULAR STENT GRAFT;  Surgeon: Conrad Livingston Manor, MD;  Location: [place];  Service: Vascular;  Laterality: N/A;  . CARDIAC CATHETERIZATION  06/2004   negative  . COLONOSCOPY  04/2005  . COPD exacerbation    . CORONARY ANGIOPLASTY  01/09/2018  . CORONARY STENT INTERVENTION N/A 01/10/2018   Procedure: CORONARY STENT INTERVENTION;  Surgeon: Lorretta Harp, MD;  Location: [place] [facility];  Service: Cardiovascular;  Laterality: N/A;  . CORONARY/GRAFT ACUTE MI REVASCULARIZATION N/A 01/10/2018   Procedure: Coronary/Graft Acute MI Revascularization;  Surgeon: Lorretta Harp, MD;  Location: [place] [facility];  Service: Cardiovascular;  Laterality: N/A;  .  EMBOLIZATION Left 03/19/2018  . EMBOLIZATION Left 03/19/2018   Procedure: EMBOLIZATION - Left Internal;  Surgeon: Conrad [place]en, MD;  Location: [place] [facility];  Service: Cardiovascular;  Laterality: Left;  . EMBOLIZATION Right 04/16/2018   Procedure: EMBOLIZATION;  Surgeon: Conrad Denmark, MD;  Location: [place] [facility];  Service: Cardiovascular;  Laterality: Right;  . FEMORAL ARTERY EXPLORATION Right 05/07/2018   Procedure: FEMORAL ARTERY EXPLORATION, EXTENDED PROFUNDAPLASTY;  Surgeon: Conrad Bagdad, MD;  Location: [place];  Service: Vascular;   Laterality: Right;  . INTRAOPERATIVE ARTERIOGRAM Right 05/07/2018   Procedure: INTRA OPERATIVE ARTERIOGRAM;  Surgeon: Conrad Florence, MD;  Location: [place];  Service: Vascular;  Laterality: Right;  . LEFT HEART CATH AND CORONARY ANGIOGRAPHY N/A 01/10/2018   Procedure: LEFT HEART CATH AND CORONARY ANGIOGRAPHY;  Surgeon: Lorretta Harp, MD;  Location: [place] [facility];  Service: Cardiovascular;  Laterality: N/A;  . PACEMAKER INSERTION  11/2005  . PACEMAKER LEAD REMOVAL N/A 10/17/2014   Procedure: PACEMAKER LEAD REMOVAL;  Surgeon: Evans Lance, MD;  Location: [place];  Service: Cardiovascular;  Laterality: N/A;  . RENAL ANGIOGRAPHY Left 04/16/2018   Procedure: RENAL ANGIOGRAPHY;  Surgeon: Conrad Neilton, MD;  Location: [place] [facility];  Service: Cardiovascular;  Laterality: Left;  . s/p ICD placement      [employer] Maximo [phone] single chamber     Current Outpatient Medications  Medication Sig Dispense Refill  . atorvastatin (LIPITOR) 80 MG tablet Take 1 tablet (80 mg total) by mouth daily at 6 PM. 90 tablet 3  . bisoprolol (ZEBETA) 10 MG tablet Take 1 tablet (10 mg total) by mouth daily. 90 tablet 3  . clopidogrel (PLAVIX) 75 MG tablet Take 1 tablet (75 mg total) by mouth daily. 30 tablet 11  . fluticasone (FLONASE) 50 MCG/ACT nasal spray Place 2 sprays into both nostrils daily. 16 g 0  . losartan (COZAAR) 25 MG tablet Take 1 tablet (25 mg total) by mouth daily. 90 tablet 3  . metFORMIN (GLUCOPHAGE-XR) 500 MG 24 hr tablet Take 2 tablets (1,000 mg total) by mouth daily with breakfast. 60 tablet 11  . multivitamin (THERAGRAN) per tablet Take 1 tablet by mouth daily.     . nitroGLYCERIN (NITROSTAT) 0.4 MG SL tablet Place 1 tablet (0.4 mg total) under the tongue every 5 (five) minutes as needed. (Patient taking differently: Place 0.4 mg under the tongue every 5 (five) minutes as needed for chest pain. ) 25 tablet 2  . predniSONE (STERAPRED UNI-PAK 21 TAB) 10 MG (21) TBPK tablet Take 6 tabs (60mg )  day 1, 5 tabs (50mg ) day 2, 4 tabs (40mg ) day 3, 3 tabs (30mg ) day 4, 2 tabs (20mg ) day 5, and 1 tab (10mg ) day 6. 21 tablet 0  . spironolactone (ALDACTONE) 25 MG tablet Take 0.5 tablets (12.5 mg total) by mouth daily. 45 tablet 3  . tiotropium (SPIRIVA) 18 MCG inhalation capsule Place 1 capsule (18 mcg total) into inhaler and inhale daily. 30 capsule 11  . VENTOLIN HFA 108 (90 Base) MCG/ACT inhaler TAKE 2 PUFFS BY MOUTH EVERY 6 HOURS AS NEEDED FOR WHEEZE OR SHORTNESS OF BREATH *USE W/ SPACER* 18 Inhaler 3  . vitamin B-12 (CYANOCOBALAMIN) 1000 MCG tablet Take 1,000 mcg by mouth daily.      No current facility-administered medications for this visit.     Allergies:   Patient has no known allergies.    Social History:  The patient  reports that he quit smoking  about a year ago. His smoking use included cigarettes. He has a 30.00 pack-year smoking history. He has never used smokeless tobacco. He reports that he does not drink alcohol or use drugs.   Family History:  The patient's family history includes Cancer in his brother and sister; Coronary artery disease in his maternal aunt; Heart failure in his maternal aunt; Lung cancer in his maternal aunt and maternal uncle; Stroke in his father.    ROS: All other systems are reviewed and negative. Unless otherwise mentioned in H&P    PHYSICAL EXAM: VS:  BP 127/80   Pulse 67   Ht 5\' 10"  (1.778 m)   Wt 283 lb 3.2 oz (128.5 kg)   BMI 40.63 kg/m  , BMI Body mass index is 40.63 kg/m. GEN: Well nourished, well developed, in no acute distress, obese HEENT: normal Neck: no JVD, carotid bruits, or masses Cardiac: RRR; no murmurs, rubs, or gallops,no edema  Respiratory:  Clear to auscultation bilaterally, normal work of breathing GI: soft, nontender, nondistended, + BS MS: no deformity or atrophy Skin: warm and dry, no rash Neuro:  Strength and sensation are intact Psych: euthymic mood, full affect   EKG:  Not completed this office visit.    Recent Labs: 01/09/2018: B Natriuretic Peptide 26.1 05/08/2018: Magnesium 1.5 08/25/2018: ALT 22; TSH 2.460 12/23/2018: BUN 9; Creatinine, Ser 1.03; Hemoglobin 15.3; Platelets 298; Potassium 4.5; Sodium 138    Lipid Panel    Component Value Date/Time   CHOL 156 08/25/2018 1154   TRIG 400 (H) 08/25/2018 1154   TRIG 575 (HH) 01/02/2007 0000   HDL 41 08/25/2018 1154   CHOLHDL 3.8 08/25/2018 1154   CHOLHDL 4 01/28/2018 1624   VLDL 44.6 (H) 01/28/2018 1624   LDLCALC 35 08/25/2018 1154   LDLDIRECT 35.0 01/28/2018 1624      Wt Readings from Last 3 Encounters:  12/28/18 283 lb 3.2 oz (128.5 kg)  12/23/18 285 lb (129.3 kg)  12/04/18 283 lb (128.4 kg)      Other studies Reviewed: Cardiac cath 01/10/2018  Mid RCA lesion is 100% stenosed.  A stent was successfully placed.  Post intervention, there is a 0% residual stenosis.  Prox Cx to Mid Cx lesion is 100% stenosed.  Ost LAD to Prox LAD lesion is 50% stenosed.  A stent was successfully placed.  Post intervention, there is a 0% residual stenosis.  There is severe left ventricular systolic dysfunction.  LV end diastolic pressure is moderately elevated.  The left ventricular ejection fraction is less than 25% by visual estimate.  Echocardiogram 08/28/2018 Left ventricle: The cavity size was moderately dilated. There was   moderate concentric hypertrophy. Systolic function was mildly to   moderately reduced. The estimated ejection fraction was in the   range of 40% to 45%. Diffuse hypokinesis. Doppler parameters are   consistent with abnormal left ventricular relaxation (grade 1   diastolic dysfunction). Doppler parameters are consistent with   indeterminate ventricular filling pressure. - Aortic valve: Transvalvular velocity was within the normal range.   There was no stenosis. There was no regurgitation. - Mitral valve: There was trivial regurgitation. - Left atrium: The atrium was severely dilated. - Right ventricle:  The cavity size was normal. Wall thickness was   normal. Systolic function was normal. - Tricuspid valve: There was trivial regurgitation. - Pulmonary arteries: Systolic pressure was within the normal   range. PA peak pressure: 21 mm Hg (S).   ASSESSMENT AND PLAN:  1. CAD: PCI to the  mid RCA and occluded left Cx. He is being treated medically. Chest pain was found to be non-cardiac and ruled out for ACS, when seen in ER. It appears more musculoskeletal based upon throbbing. Feels better with steroid dose pack.   2. Chronic back and neck pain: He will be seeing orthopedic physician on referral from PCP  3. AAA: He is to follow up with Dr. Lianne Moris office as he is due for appt in January 2020.   Current medicines are reviewed at length with the patient today.    Labs/ tests ordered today include: None Phill Myron. [place] Pugh, ANP, AACC   12/28/2018 12:21 PM    [facility] [place] [address] Office [phone] Fax [phone]

## 2018-12-28 ENCOUNTER — Telehealth: Payer: Self-pay

## 2018-12-28 ENCOUNTER — Ambulatory Visit (INDEPENDENT_AMBULATORY_CARE_PROVIDER_SITE_OTHER): Payer: BLUE CROSS/BLUE SHIELD | Admitting: Adult Health

## 2018-12-28 ENCOUNTER — Encounter: Payer: Self-pay | Admitting: Adult Health

## 2018-12-28 VITALS — BP 127/80 | HR 67 | Ht 70.0 in | Wt 283.2 lb

## 2018-12-28 DIAGNOSIS — I714 Abdominal aortic aneurysm, without rupture, unspecified: Secondary | ICD-10-CM

## 2018-12-28 DIAGNOSIS — I2510 Atherosclerotic heart disease of native coronary artery without angina pectoris: Secondary | ICD-10-CM

## 2018-12-28 DIAGNOSIS — R079 Chest pain, unspecified: Secondary | ICD-10-CM

## 2018-12-28 NOTE — Telephone Encounter (Signed)
Pt said he is doing better. Saw cardiology today. They said everything looked fine. Referring him to spine specialist. Possibility his pain is coming from his back to his chest.

## 2018-12-28 NOTE — Patient Instructions (Signed)
Follow-Up: You will need a follow up appointment in April 2020. You may see Minus Breeding, MD  Jory Sims, DNP, [place]   or one of the following Advanced Practice Providers on your designated Care Team:   Jory Sims, DNP, AACC Rosaria Ferries, PA-C  Medication Instructions:  NO CHANGES- Your physician recommends that you continue on your current medications as directed. Please refer to the Current Medication list given to you today. If you need a refill on your cardiac medications before your next appointment, please call your pharmacy. Labwork: When you have labs (blood work) and your tests are completely normal, you will receive your results ONLY by [place] (if you have MyChart) -OR- A paper copy in the mail. At [facility], you and your health needs are our priority.  As part of our continuing mission to provide you with exceptional heart care, we have created designated Provider Care Teams.  These Care Teams include your primary Cardiologist (physician) and Advanced Practice Providers (APPs -  Physician Assistants and Nurse Practitioners) who all work together to provide you with the care you need, when you need it.  Thank you for choosing [facility] at [facility]!!

## 2018-12-29 ENCOUNTER — Encounter: Payer: Self-pay | Admitting: Physical Medicine & Rehabilitation

## 2019-01-02 ENCOUNTER — Other Ambulatory Visit: Payer: Self-pay | Admitting: Cardiology

## 2019-01-19 ENCOUNTER — Encounter: Payer: BLUE CROSS/BLUE SHIELD | Attending: Physical Medicine & Rehabilitation

## 2019-01-19 ENCOUNTER — Ambulatory Visit (HOSPITAL_BASED_OUTPATIENT_CLINIC_OR_DEPARTMENT_OTHER): Payer: BLUE CROSS/BLUE SHIELD | Admitting: Physical Medicine & Rehabilitation

## 2019-01-19 ENCOUNTER — Encounter: Payer: Self-pay | Admitting: Physical Medicine & Rehabilitation

## 2019-01-19 ENCOUNTER — Other Ambulatory Visit: Payer: Self-pay

## 2019-01-19 VITALS — BP 121/67 | HR 65 | Ht 70.0 in | Wt 281.8 lb

## 2019-01-19 DIAGNOSIS — M5442 Lumbago with sciatica, left side: Secondary | ICD-10-CM | POA: Diagnosis not present

## 2019-01-19 DIAGNOSIS — E1140 Type 2 diabetes mellitus with diabetic neuropathy, unspecified: Secondary | ICD-10-CM | POA: Insufficient documentation

## 2019-01-19 DIAGNOSIS — E1151 Type 2 diabetes mellitus with diabetic peripheral angiopathy without gangrene: Secondary | ICD-10-CM | POA: Insufficient documentation

## 2019-01-19 DIAGNOSIS — M7542 Impingement syndrome of left shoulder: Secondary | ICD-10-CM | POA: Diagnosis not present

## 2019-01-19 DIAGNOSIS — J439 Emphysema, unspecified: Secondary | ICD-10-CM | POA: Insufficient documentation

## 2019-01-19 DIAGNOSIS — Z950 Presence of cardiac pacemaker: Secondary | ICD-10-CM | POA: Insufficient documentation

## 2019-01-19 DIAGNOSIS — M47817 Spondylosis without myelopathy or radiculopathy, lumbosacral region: Secondary | ICD-10-CM | POA: Diagnosis not present

## 2019-01-19 DIAGNOSIS — I428 Other cardiomyopathies: Secondary | ICD-10-CM | POA: Diagnosis not present

## 2019-01-19 DIAGNOSIS — I2510 Atherosclerotic heart disease of native coronary artery without angina pectoris: Secondary | ICD-10-CM | POA: Insufficient documentation

## 2019-01-19 DIAGNOSIS — M5441 Lumbago with sciatica, right side: Secondary | ICD-10-CM

## 2019-01-19 DIAGNOSIS — G8929 Other chronic pain: Secondary | ICD-10-CM

## 2019-01-19 DIAGNOSIS — Z87891 Personal history of nicotine dependence: Secondary | ICD-10-CM | POA: Diagnosis not present

## 2019-01-19 DIAGNOSIS — Z800 Family history of malignant neoplasm of digestive organs: Secondary | ICD-10-CM | POA: Insufficient documentation

## 2019-01-19 DIAGNOSIS — Z8249 Family history of ischemic heart disease and other diseases of the circulatory system: Secondary | ICD-10-CM | POA: Insufficient documentation

## 2019-01-19 DIAGNOSIS — G4730 Sleep apnea, unspecified: Secondary | ICD-10-CM | POA: Insufficient documentation

## 2019-01-19 DIAGNOSIS — Z9861 Coronary angioplasty status: Secondary | ICD-10-CM | POA: Insufficient documentation

## 2019-01-19 DIAGNOSIS — I10 Essential (primary) hypertension: Secondary | ICD-10-CM | POA: Insufficient documentation

## 2019-01-19 DIAGNOSIS — Z801 Family history of malignant neoplasm of trachea, bronchus and lung: Secondary | ICD-10-CM | POA: Diagnosis not present

## 2019-01-19 DIAGNOSIS — M25512 Pain in left shoulder: Secondary | ICD-10-CM | POA: Diagnosis not present

## 2019-01-19 NOTE — Progress Notes (Signed)
Subjective:    Patient ID: [name], male    DOB: August 25, 1956, 63 y.o.   MRN: [id number]  HPI CC  Low back pain x 3-4 yrs  63yo male with hx of CAD and MI s/p stenting of RCA, EF ~35% has aorto iliac disease with multiple revascularization procedures of AAA and iliac arteries.  Pt states he has "blockage" between knee and ankle , and in thigh on the right.  Pain is midline radiating to the left hip. Sitting or laying relieve pain.  Pt has become more sedentary  Has Left > Right shoulder pain as well as neck pain for 4+ years Left shoulder pain increased with elevation No shoulder pain with neck ROM Has numbness and tingling in both hands at night and with driving car.  No known hx of carpal tunnel  Tried PT for back ~4 yrs ago which did not help his pain after 4 visits  Pt has aa lesser amt of numbness and tingling in toes bilaterally, pt is a diabetic  Still with occ CP but last ED and cardiology visits indicated non cardia c origin.  Per pt, cardiologist questioning neck as source of Left shoulder pain The patient reports having had a cortisone injection into the left shoulder which gave him pain relief for approximately 1 year  Gained about 30 lb in the last few years  HOH compensated with hearing aides  Bowel and bladder doing well Pain Inventory Average Pain 10 Pain Right Now 5 My pain is sharp  In the last 24 hours, has pain interfered with the following? General activity 7 Relation with others 0 Enjoyment of life 7 What TIME of day is your pain at its worst? daytime Sleep (in general) Poor  Pain is worse with: walking, bending, standing and some activites Pain improves with: na Relief from Meds: 0  Mobility use a walker how many minutes can you walk? 5 ability to climb steps?  no do you drive?  yes  Function disabled: date disabled 2005  Neuro/Psych numbness trouble walking dizziness  Prior Studies Any changes since last visit?   no  Physicians involved in your care Primary care Dr. Silvio Pate   Family History  Problem Relation Age of Onset  . Stroke Father   . Coronary artery disease Maternal Aunt   . Heart failure Maternal Aunt   . Lung cancer Maternal Aunt   . Lung cancer Maternal Uncle   . Cancer Brother        Mouth  . Cancer Sister        throat   Social History   Socioeconomic History  . Marital status: Married    Spouse name: Not on file  . Number of children: 2  . Years of education: Not on file  . Highest education level: Not on file  Occupational History  . Occupation: Grave digger--now disabled  Social Needs  . Financial resource strain: Not on file  . Food insecurity:    Worry: Not on file    Inability: Not on file  . Transportation needs:    Medical: Not on file    Non-medical: Not on file  Tobacco Use  . Smoking status: Former Smoker    Packs/day: 2.00    Years: 15.00    Pack years: 30.00    Types: Cigarettes    Last attempt to quit: 01/09/2018    Years since quitting: 1.0  . Smokeless tobacco: Never Used  . Tobacco comment: GAVE 1-800-QUIT-NOW  Substance and Sexual Activity  . Alcohol use: No  . Drug use: No  . Sexual activity: Never  Lifestyle  . Physical activity:    Days per week: Not on file    Minutes per session: Not on file  . Stress: Not on file  Relationships  . Social connections:    Talks on phone: Not on file    Gets together: Not on file    Attends religious service: Not on file    Active member of club or organization: Not on file    Attends meetings of clubs or organizations: Not on file    Relationship status: Not on file  Other Topics Concern  . Not on file  Social History Narrative   No living will   Requests wife as health care POA   Would accept resuscitation but doesn't want prolonged ventilation   No tube feeds if cognitively unaware   Past Surgical History:  Procedure Laterality Date  . ABDOMINAL AORTIC ENDOVASCULAR STENT GRAFT N/A  05/07/2018   Procedure: ABDOMINAL AORTIC ENDOVASCULAR STENT GRAFT;  Surgeon: Conrad Keeler, MD;  Location: [place];  Service: Vascular;  Laterality: N/A;  . CARDIAC CATHETERIZATION  06/2004   negative  . COLONOSCOPY  04/2005  . COPD exacerbation    . CORONARY ANGIOPLASTY  01/09/2018  . CORONARY STENT INTERVENTION N/A 01/10/2018   Procedure: CORONARY STENT INTERVENTION;  Surgeon: Lorretta Harp, MD;  Location: [place];  Service: Cardiovascular;  Laterality: N/A;  . CORONARY/GRAFT ACUTE MI REVASCULARIZATION N/A 01/10/2018   Procedure: Coronary/Graft Acute MI Revascularization;  Surgeon: Lorretta Harp, MD;  Location: [place] [facility];  Service: Cardiovascular;  Laterality: N/A;  . EMBOLIZATION Left 03/19/2018  . EMBOLIZATION Left 03/19/2018   Procedure: EMBOLIZATION - Left Internal;  Surgeon: Conrad Nora, MD;  Location: [facility];  Service: Cardiovascular;  Laterality: Left;  . EMBOLIZATION Right 04/16/2018   Procedure: EMBOLIZATION;  Surgeon: Conrad Aurora, MD;  Location: [place] [facility];  Service: Cardiovascular;  Laterality: Right;  . FEMORAL ARTERY EXPLORATION Right 05/07/2018   Procedure: FEMORAL ARTERY EXPLORATION, EXTENDED PROFUNDAPLASTY;  Surgeon: Conrad Suffolk, MD;  Location: [facility];  Service: Vascular;  Laterality: Right;  . INTRAOPERATIVE ARTERIOGRAM Right 05/07/2018   Procedure: INTRA OPERATIVE ARTERIOGRAM;  Surgeon: Conrad Lebanon, MD;  Location: [facility];  Service: Vascular;  Laterality: Right;  . LEFT HEART CATH AND CORONARY ANGIOGRAPHY N/A 01/10/2018   Procedure: LEFT HEART CATH AND CORONARY ANGIOGRAPHY;  Surgeon: Lorretta Harp, MD;  Location: [facility];  Service: Cardiovascular;  Laterality: N/A;  . PACEMAKER INSERTION  11/2005  . PACEMAKER LEAD REMOVAL N/A 10/17/2014   Procedure: PACEMAKER LEAD REMOVAL;  Surgeon: Evans Lance, MD;  Location: [place];  Service: Cardiovascular;  Laterality: N/A;  . RENAL ANGIOGRAPHY Left 04/16/2018   Procedure:  RENAL ANGIOGRAPHY;  Surgeon: Conrad , MD;  Location: [place] [facility];  Service: Cardiovascular;  Laterality: Left;  . s/p ICD placement      Medtronic Maximo [phone] single chamber   Past Medical History:  Diagnosis Date  . Arthritis   . Automatic implantable cardioverter-defibrillator in situ   . Benign essential HTN 09/17/2016  . COPD (chronic obstructive pulmonary disease) ([place])    emphysema by CXR  . Coronary artery disease     LAD a 60-70% stenosis, moderate size second diagonal with 50% stenosis, circumflex AV groove 75-80% stenosis with a branching obtuse marginal with a  superior branch subtotal stenosis followed by diffuse disease, right coronary artery  had nonobstructive disease.  EF was 35%  . Diabetes mellitus    type 2  no meds  . Diverticulosis of colon   . GERD (gastroesophageal reflux disease)    with esophagitis  . History of colonic polyps   . Hypertension   . Non-ischemic cardiomyopathy ([place])   . Presence of permanent cardiac pacemaker   . PVD (peripheral vascular disease) ([place])    bilateral common iliac artery aneurysms. Right SFA occlusion over a long segment. Left SFA disease with occlusion of the left TP trunk. Artirogram Oct. 2006  . Shortness of breath    exertion  . Sleep apnea   . Tobacco abuse   . Urinary incontinence    detrussor instability   BP 121/67   Pulse 65   Ht 5\' 10"  (1.778 m)   Wt 281 lb 12.8 oz (127.8 kg)   SpO2 93%   BMI 40.43 kg/m   Opioid Risk Score:   Fall Risk Score:  `1  Depression screen PHQ 2/9  Depression screen [facility] 2/9 01/19/2019 12/04/2018 10/28/2014  Decreased Interest 3 0 0  Down, Depressed, Hopeless 1 0 0  PHQ - 2 Score 4 0 0  Altered sleeping 3 - -  Tired, decreased energy 2 - -  Change in appetite 1 - -  Feeling bad or failure about yourself  0 - -  Trouble concentrating 0 - -  Moving slowly or fidgety/restless 0 - -  Suicidal thoughts 0 - -  PHQ-9 Score 10 - -  Difficult doing work/chores Not  difficult at all - -  Some recent data might be hidden    Review of Systems  Constitutional: Positive for unexpected weight change.  HENT: Negative.   Eyes: Negative.   Respiratory: Positive for apnea, shortness of breath and wheezing.   Cardiovascular: Negative.   Gastrointestinal: Negative.   Endocrine: Negative.   Genitourinary: Negative.   Musculoskeletal: Negative.   Skin: Negative.   Allergic/Immunologic: Negative.   Neurological: Negative.   Hematological: Bruises/bleeds easily.       High blood sugar  Psychiatric/Behavioral: Negative.   All other systems reviewed and are negative.      Objective:   Physical Exam Constitutional:      Appearance: Normal appearance. He is obese.  HENT:     Head: Normocephalic and atraumatic.     Ears:     Comments: Bilateral hearing aids    Nose: No congestion.     Mouth/Throat:     Mouth: Mucous membranes are moist.  Eyes:     Extraocular Movements: Extraocular movements intact.     Pupils: Pupils are equal, round, and reactive to light.  Cardiovascular:     Rate and Rhythm: Normal rate and regular rhythm.     Heart sounds: No murmur.     Comments: Pacemaker left upper chest wall area Pulmonary:     Effort: Pulmonary effort is normal. No respiratory distress.     Breath sounds: Normal breath sounds. No stridor. No wheezing.  Abdominal:     General: Abdomen is flat. There is no distension.     Palpations: Abdomen is soft.     Tenderness: There is no abdominal tenderness.  Musculoskeletal: Normal range of motion.  Neurological:     Mental Status: He is alert.     Deep Tendon Reflexes:     Reflex Scores:      Tricep reflexes are 1+ on the  right side and 1+ on the left side.      Bicep reflexes are 1+ on the right side and 1+ on the left side.      Brachioradialis reflexes are 1+ on the right side and 1+ on the left side.      Patellar reflexes are 2+ on the right side and 2+ on the left side.      Achilles reflexes are 2+  on the right side and 2+ on the left side.   Motor strength is 5/5 bilateral deltoid, bicep, tricep, grip, hip flexor, knee extensor, ankle dorsiflexor Left shoulder has positive impingement sign at 90 degrees of abduction. Cervical spine has 75% range of motion flexion extension lateral 10 rotation and bending negative foraminal compression test Sensation intact to light touch and pinprick bilateral C5-C6 C8 dermatomal distribution in the median nerve distribution there is reduction in pinprick bilaterally. There is reduction in left L3 pinprick but otherwise normal in the lower extremities. Gait is without assistive device no evidence of toe drag or knee instability Lumbar range of motion has normal lumbar flexion but limited lumbar extension to 25% Bilateral hip range of motion is normal with internal and external rotation.       Assessment & Plan:  1.  Chronic low back pain I reviewed his CT of the abdomen focusing on the lumbar spine.  He does not have any signs of central canal stenosis however he does have lumbar facet arthropathy primarily at L5-S1 and to lesser extent L4-5.  Could not really assess for foraminal stenosis.  He has no signs of lower extremity weakness. Would focus primarily on his axial back pain with lumbar medial branch blocks L3-L4 as well as L5 dorsal ramus injections. 2.  Left shoulder pain with positive impingement signs I do not think this is cervicogenic.  He has had good results with prior subacromial bursa injection with corticosteroid.  We will repeat at this time I do not think he will need to get cervical spine x-rays.  Shoulder injection left subacromial   Indication: Left shoulder pain not relieved by medication management and other conservative care.  Informed consent was obtained after describing risks and benefits of the procedure with the patient, this includes bleeding, bruising, infection and medication side effects. The patient wishes to proceed  and has given written consent. Patient was placed in a seated position. The left shoulder was marked and prepped with betadine in the subacromial area. A 25-gauge 1-1/2 inch needle was inserted into the subacromial area. After negative draw back for blood, a solution containing 1 mL of 6 mg per ML betamethasone and 4 mL of 1% lidocaine was injected. A band aid was applied. The patient tolerated the procedure well. Post procedure instructions were given.  3.  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 carpal tunnel syndrome versus diabetic polyneuropathy.  Will ask him to wear wrist splints.  If no improvement after 1 month would recommend EMG

## 2019-01-19 NOTE — Patient Instructions (Signed)
Shoulder Exercises Ask your health care provider which exercises are safe for you. Do exercises exactly as told by your health care provider and adjust them as directed. It is normal to feel mild stretching, pulling, tightness, or discomfort as you do these exercises, but you should stop right away if you feel sudden pain or your pain gets worse.Do not begin these exercises until told by your health care provider. Range of Motion Exercises        These exercises warm up your muscles and joints and improve the movement and flexibility of your shoulder. These exercises also help to relieve pain, numbness, and tingling. These exercises involve stretching your injured shoulder directly. Exercise A: Pendulum 1. Stand near a wall or a surface that you can hold onto for balance. 2. Bend at the waist and let your left / right arm hang straight down. Use your other arm to support you. Keep your back straight and do not lock your knees. 3. Relax your left / right arm and shoulder muscles, and move your hips and your trunk so your left / right arm swings freely. Your arm should swing because of the motion of your body, not because you are using your arm or shoulder muscles. 4. Keep moving your body so your arm swings in the following directions, as told by your health care provider: ? Side to side. ? Forward and backward. ? In clockwise and counterclockwise circles. 5. Continue each motion for __________ seconds, or for as long as told by your health care provider. 6. Slowly return to the starting position. Repeat __________ times. Complete this exercise __________ times a day. Exercise B:Flexion, Standing 1. Stand and hold a broomstick, a cane, or a similar object. Place your hands a little more than shoulder-width apart on the object. Your left / right hand should be palm-up, and your other hand should be palm-down. 2. Keep your elbow straight and keep your shoulder muscles relaxed. Push the stick  down with your healthy arm to raise your left / right arm in front of your body, and then over your head until you feel a stretch in your shoulder. ? Avoid shrugging your shoulder while you raise your arm. Keep your shoulder blade tucked down toward the middle of your back. 3. Hold for __________ seconds. 4. Slowly return to the starting position. Repeat __________ times. Complete this exercise __________ times a day. Exercise C: Abduction, Standing 1. Stand and hold a broomstick, a cane, or a similar object. Place your hands a little more than shoulder-width apart on the object. Your left / right hand should be palm-up, and your other hand should be palm-down. 2. While keeping your elbow straight and your shoulder muscles relaxed, push the stick across your body toward your left / right side. Raise your left / right arm to the side of your body and then over your head until you feel a stretch in your shoulder. ? Do not raise your arm above shoulder height, unless your health care provider tells you to do that. ? Avoid shrugging your shoulder while you raise your arm. Keep your shoulder blade tucked down toward the middle of your back. 3. Hold for __________ seconds. 4. Slowly return to the starting position. Repeat __________ times. Complete this exercise __________ times a day. Exercise D:Internal Rotation 1. Place your left / right hand behind your back, palm-up. 2. Use your other hand to dangle an exercise band, a towel, or a similar object over your shoulder.   Grasp the band with your left / right hand so you are holding onto both ends. 3. Gently pull up on the band until you feel a stretch in the front of your left / right shoulder. ? Avoid shrugging your shoulder while you raise your arm. Keep your shoulder blade tucked down toward the middle of your back. 4. Hold for __________ seconds. 5. Release the stretch by letting go of the band and lowering your hands. Repeat __________ times.  Complete this exercise __________ times a day. Stretching Exercises  These exercises warm up your muscles and joints and improve the movement and flexibility of your shoulder. These exercises also help to relieve pain, numbness, and tingling. These exercises are done using your healthy shoulder to help stretch the muscles of your injured shoulder. Exercise E: Corner Stretch (External Rotation and Abduction) 1. Stand in a doorway with one of your feet slightly in front of the other. This is called a staggered stance. If you cannot reach your forearms to the door frame, stand facing a corner of a room. 2. Choose one of the following positions as told by your health care provider: ? Place your hands and forearms on the door frame above your head. ? Place your hands and forearms on the door frame at the height of your head. ? Place your hands on the door frame at the height of your elbows. 3. Slowly move your weight onto your front foot until you feel a stretch across your chest and in the front of your shoulders. Keep your head and chest upright and keep your abdominal muscles tight. 4. Hold for __________ seconds. 5. To release the stretch, shift your weight to your back foot. Repeat __________ times. Complete this stretch __________ times a day. Exercise F:Extension, Standing 1. Stand and hold a broomstick, a cane, or a similar object behind your back. ? Your hands should be a little wider than shoulder-width apart. ? Your palms should face away from your back. 2. Keeping your elbows straight and keeping your shoulder muscles relaxed, move the stick away from your body until you feel a stretch in your shoulder. ? Avoid shrugging your shoulders while you move the stick. Keep your shoulder blade tucked down toward the middle of your back. 3. Hold for __________ seconds. 4. Slowly return to the starting position. Repeat __________ times. Complete this exercise __________ times a  day. Strengthening Exercises           These exercises build strength and endurance in your shoulder. Endurance is the ability to use your muscles for a long time, even after they get tired. Exercise G:External Rotation 1. Sit in a stable chair without armrests. 2. Secure an exercise band at elbow height on your left / right side. 3. Place a soft object, such as a folded towel or a small pillow, between your left / right upper arm and your body to move your elbow a few inches away (about 10 cm) from your side. 4. Hold the end of the band so it is tight and there is no slack. 5. Keeping your elbow pressed against the soft object, move your left / right forearm out, away from your abdomen. Keep your body steady so only your forearm moves. 6. Hold for __________ seconds. 7. Slowly return to the starting position. Repeat __________ times. Complete this exercise __________ times a day. Exercise H:Shoulder Abduction 1. Sit in a stable chair without armrests, or stand. 2. Hold a __________ weight in your   left / right hand, or hold an exercise band with both hands. 3. Start with your arms straight down and your left / right palm facing in, toward your body. 4. Slowly lift your left / right hand out to your side. Do not lift your hand above shoulder height unless your health care provider tells you that this is safe. ? Keep your arms straight. ? Avoid shrugging your shoulder while you do this movement. Keep your shoulder blade tucked down toward the middle of your back. 5. Hold for __________ seconds. 6. Slowly lower your arm, and return to the starting position. Repeat __________ times. Complete this exercise __________ times a day. Exercise I:Shoulder Extension 1. Sit in a stable chair without armrests, or stand. 2. Secure an exercise band to a stable object in front of you where it is at shoulder height. 3. Hold one end of the exercise band in each hand. Your palms should face each  other. 4. Straighten your elbows and lift your hands up to shoulder height. 5. Step back, away from the secured end of the exercise band, until the band is tight and there is no slack. 6. Squeeze your shoulder blades together as you pull your hands down to the sides of your thighs. Stop when your hands are straight down by your sides. Do not let your hands go behind your body. 7. Hold for __________ seconds. 8. Slowly return to the starting position. Repeat __________ times. Complete this exercise __________ times a day. Exercise J:Standing Shoulder Row 1. Sit in a stable chair without armrests, or stand. 2. Secure an exercise band to a stable object in front of you so it is at waist height. 3. Hold one end of the exercise band in each hand. Your palms should be in a thumbs-up position. 4. Bend each of your elbows to an "L" shape (about 90 degrees) and keep your upper arms at your sides. 5. Step back until the band is tight and there is no slack. 6. Slowly pull your elbows back behind you. 7. Hold for __________ seconds. 8. Slowly return to the starting position. Repeat __________ times. Complete this exercise __________ times a day. Exercise K:Shoulder Press-Ups 1. Sit in a stable chair that has armrests. Sit upright, with your feet flat on the floor. 2. Put your hands on the armrests so your elbows are bent and your fingers are pointing forward. Your hands should be about even with the sides of your body. 3. Push down on the armrests and use your arms to lift yourself off of the chair. Straighten your elbows and lift yourself up as much as you comfortably can. ? Move your shoulder blades down, and avoid letting your shoulders move up toward your ears. ? Keep your feet on the ground. As you get stronger, your feet should support less of your body weight as you lift yourself up. 4. Hold for __________ seconds. 5. Slowly lower yourself back into the chair. Repeat __________ times. Complete  this exercise __________ times a day. Exercise L: Wall Push-Ups 1. Stand so you are facing a stable wall. Your feet should be about one arm-length away from the wall. 2. Lean forward and place your palms on the wall at shoulder height. 3. Keep your feet flat on the floor as you bend your elbows and lean forward toward the wall. 4. Hold for __________ seconds. 5. Straighten your elbows to push yourself back to the starting position. Repeat __________ times. Complete this exercise __________ times   a day. This information is not intended to replace advice given to you by your health care provider. Make sure you discuss any questions you have with your health care provider. Document Released: 10/23/2005 Document Revised: 04/14/2018 Document Reviewed: 08/20/2015 [employer] Interactive Patient Education  2019 [employer]  

## 2019-01-24 ENCOUNTER — Other Ambulatory Visit: Payer: Self-pay

## 2019-01-24 ENCOUNTER — Encounter (HOSPITAL_COMMUNITY): Payer: Self-pay | Admitting: Emergency Medicine

## 2019-01-24 ENCOUNTER — Ambulatory Visit (HOSPITAL_COMMUNITY)
Admission: EM | Admit: 2019-01-24 | Discharge: 2019-01-24 | Disposition: A | Discharge status: Home / Self Care | Payer: BLUE CROSS/BLUE SHIELD | Attending: Family Medicine | Admitting: Family Medicine

## 2019-01-24 DIAGNOSIS — M542 Cervicalgia: Secondary | ICD-10-CM | POA: Diagnosis not present

## 2019-01-24 MED ORDER — KETOROLAC TROMETHAMINE 60 MG/2ML IM SOLN
INTRAMUSCULAR | Status: AC
  Filled 2019-01-24: qty 2

## 2019-01-24 MED ORDER — KETOROLAC TROMETHAMINE 60 MG/2ML IM SOLN
60.0000 mg | Freq: Once | INTRAMUSCULAR | Status: AC
  Administered 2019-01-24: 60 mg via INTRAMUSCULAR

## 2019-01-24 MED ORDER — PREDNISONE 20 MG PO TABS: 20.0000 mg | ORAL_TABLET | Freq: Two times a day (BID) | ORAL | 0 refills | Status: DC

## 2019-01-24 NOTE — ED Triage Notes (Signed)
PT reports neck pain and sore throat for 2 days.

## 2019-01-24 NOTE — ED Provider Notes (Signed)
[place]   [id number] 01/24/19 Arrival Time: 5374  CC: neck pain  SUBJECTIVE: History from: patient. [name] is a 63 y.o. male hx significant for HTN, COPD, CAD, DM II, cardiomyopathy, PVD, automatic defibrillator, tobacco abuse, complains of neck pain that began 2 days ago.  States symptoms began after working in the garage.  Admit to heavy lifting and sweeping.  Localizes the pain to the back of neck.  Describes the pain as constant and throbbing in character. "Beyond 10/10" at this time. Has tried tylenol and robaxin without relief.  Symptoms are made worse with neck ROM.  Denies similar symptoms in the past.  Denies fever, chills, erythema, ecchymosis, effusion, weakness, numbness and tingling, weakness in upper extremities.    Mentions hx of degenerative joint disease in back.       ROS: As per HPI.  Past Medical History:  Diagnosis Date  . Arthritis   . Automatic implantable cardioverter-defibrillator in situ   . Benign essential HTN 09/17/2016  . COPD (chronic obstructive pulmonary disease) ([place])    emphysema by CXR  . Coronary artery disease     LAD a 60-70% stenosis, moderate size second diagonal with 50% stenosis, circumflex AV groove 75-80% stenosis with a branching obtuse marginal with a superior branch subtotal stenosis followed by diffuse disease, right coronary artery  had nonobstructive disease.  EF was 35%  . Diabetes mellitus    type 2  no meds  . Diverticulosis of colon   . GERD (gastroesophageal reflux disease)    with esophagitis  . History of colonic polyps   . Hypertension   . Non-ischemic cardiomyopathy ([place])   . Presence of permanent cardiac pacemaker   . PVD (peripheral vascular disease) ([place])    bilateral common iliac artery aneurysms. Right SFA occlusion over a long segment. Left SFA disease with occlusion of the left TP trunk. Artirogram Oct. 2006  . Shortness of breath    exertion  . Sleep apnea   . Tobacco abuse   . Urinary  incontinence    detrussor instability   Past Surgical History:  Procedure Laterality Date  . ABDOMINAL AORTIC ENDOVASCULAR STENT GRAFT N/A 05/07/2018   Procedure: ABDOMINAL AORTIC ENDOVASCULAR STENT GRAFT;  Surgeon: Conrad Olathe, MD;  Location: [facility];  Service: Vascular;  Laterality: N/A;  . CARDIAC CATHETERIZATION  06/2004   negative  . COLONOSCOPY  04/2005  . COPD exacerbation    . CORONARY ANGIOPLASTY  01/09/2018  . CORONARY STENT INTERVENTION N/A 01/10/2018   Procedure: CORONARY STENT INTERVENTION;  Surgeon: Lorretta Harp, MD;  Location: [facility];  Service: Cardiovascular;  Laterality: N/A;  . CORONARY/GRAFT ACUTE MI REVASCULARIZATION N/A 01/10/2018   Procedure: Coronary/Graft Acute MI Revascularization;  Surgeon: Lorretta Harp, MD;  Location: [facility];  Service: Cardiovascular;  Laterality: N/A;  . EMBOLIZATION Left 03/19/2018  . EMBOLIZATION Left 03/19/2018   Procedure: EMBOLIZATION - Left Internal;  Surgeon: Conrad Chisholm, MD;  Location: [facility];  Service: Cardiovascular;  Laterality: Left;  . EMBOLIZATION Right 04/16/2018   Procedure: EMBOLIZATION;  Surgeon: Conrad Atlanta, MD;  Location: [facility];  Service: Cardiovascular;  Laterality: Right;  . FEMORAL ARTERY EXPLORATION Right 05/07/2018   Procedure: FEMORAL ARTERY EXPLORATION, EXTENDED PROFUNDAPLASTY;  Surgeon: Conrad [place], MD;  Location: [facility];  Service: Vascular;  Laterality: Right;  . INTRAOPERATIVE ARTERIOGRAM Right 05/07/2018   Procedure: INTRA OPERATIVE ARTERIOGRAM;  Surgeon: Conrad Hope, MD;  [facility];  Service: Vascular;  Laterality: Right;  . LEFT HEART CATH AND CORONARY ANGIOGRAPHY N/A 01/10/2018   Procedure: LEFT HEART CATH AND CORONARY ANGIOGRAPHY;  Surgeon: Lorretta Harp, MD;  Location: [facility];  Service: Cardiovascular;  Laterality: N/A;  . PACEMAKER INSERTION  11/2005  . PACEMAKER LEAD REMOVAL N/A 10/17/2014   Procedure: PACEMAKER LEAD REMOVAL;   Surgeon: Evans Lance, MD;  Location: [facility];  Service: Cardiovascular;  Laterality: N/A;  . RENAL ANGIOGRAPHY Left 04/16/2018   Procedure: RENAL ANGIOGRAPHY;  Surgeon: Conrad Sabine, MD;  Location: [facility];  Service: Cardiovascular;  Laterality: Left;  . s/p ICD placement      Medtronic Maximo [phone] single chamber   No Known Allergies No current facility-administered medications on file prior to encounter.    Current Outpatient Medications on File Prior to Encounter  Medication Sig Dispense Refill  . atorvastatin (LIPITOR) 80 MG tablet TAKE 1 TABLET BY MOUTH  DAILY AT 6 PM. 90 tablet 3  . bisoprolol (ZEBETA) 10 MG tablet Take 1 tablet (10 mg total) by mouth daily. 90 tablet 3  . clopidogrel (PLAVIX) 75 MG tablet Take 1 tablet (75 mg total) by mouth daily. 30 tablet 11  . fluticasone (FLONASE) 50 MCG/ACT nasal spray Place 2 sprays into both nostrils daily. 16 g 0  . losartan (COZAAR) 25 MG tablet TAKE 1 TABLET BY MOUTH  DAILY 90 tablet 3  . metFORMIN (GLUCOPHAGE-XR) 500 MG 24 hr tablet Take 2 tablets (1,000 mg total) by mouth daily with breakfast. 60 tablet 11  . multivitamin (THERAGRAN) per tablet Take 1 tablet by mouth daily.     . nitroGLYCERIN (NITROSTAT) 0.4 MG SL tablet Place 1 tablet (0.4 mg total) under the tongue every 5 (five) minutes as needed. (Patient taking differently: Place 0.4 mg under the tongue every 5 (five) minutes as needed for chest pain. ) 25 tablet 2  . spironolactone (ALDACTONE) 25 MG tablet TAKE ONE-HALF TABLET BY  MOUTH DAILY 45 tablet 3  . tiotropium (SPIRIVA) 18 MCG inhalation capsule Place 1 capsule (18 mcg total) into inhaler and inhale daily. 30 capsule 11  . VENTOLIN HFA 108 (90 Base) MCG/ACT inhaler TAKE 2 PUFFS BY MOUTH EVERY 6 HOURS AS NEEDED FOR WHEEZE OR SHORTNESS OF BREATH *USE W/ SPACER* 18 Inhaler 3  . vitamin B-12 (CYANOCOBALAMIN) 1000 MCG tablet Take 1,000 mcg by mouth daily.      Social History   Socioeconomic History  . Marital  status: Married    Spouse name: Not on file  . Number of children: 2  . Years of education: Not on file  . Highest education level: Not on file  Occupational History  . Occupation: Grave digger--now disabled  Social Needs  . Financial resource strain: Not on file  . Food insecurity:    Worry: Not on file    Inability: Not on file  . Transportation needs:    Medical: Not on file    Non-medical: Not on file  Tobacco Use  . Smoking status: Former Smoker    Packs/day: 2.00    Years: 15.00    Pack years: 30.00    Types: Cigarettes    Last attempt to quit: 01/09/2018    Years since quitting: 1.0  . Smokeless tobacco: Never Used  . Tobacco comment: GAVE 1-800-QUIT-NOW  Substance and Sexual Activity  . Alcohol use: No  . Drug use: No  . Sexual activity: Never  Lifestyle  .  Physical activity:    Days per week: Not on file    Minutes per session: Not on file  . Stress: Not on file  Relationships  . Social connections:    Talks on phone: Not on file    Gets together: Not on file    Attends religious service: Not on file    Active member of club or organization: Not on file    Attends meetings of clubs or organizations: Not on file    Relationship status: Not on file  . Intimate partner violence:    Fear of current or ex partner: Not on file    Emotionally abused: Not on file    Physically abused: Not on file    Forced sexual activity: Not on file  Other Topics Concern  . Not on file  Social History Narrative   No living will   Requests wife as health care POA   Would accept resuscitation but doesn't want prolonged ventilation   No tube feeds if cognitively unaware   Family History  Problem Relation Age of Onset  . Stroke Father   . Coronary artery disease Maternal Aunt   . Heart failure Maternal Aunt   . Lung cancer Maternal Aunt   . Lung cancer Maternal Uncle   . Cancer Brother        Mouth  . Cancer Sister        throat    OBJECTIVE:  Vitals:   01/24/19  1753  BP: (!) 174/90  Pulse: 73  Resp: 16  Temp: 98.4 F (36.9 C)  TempSrc: Oral  SpO2: 96%    General appearance: Alert; in no acute distress.  HEENT: NCAT; PERRL, nares patent without rhinorrhea, oropharynx clear, speaking in full sentences and tolerating own secretions Lungs: CTA bilaterally Heart: RRR Musculoskeletal: Neck Inspection: Skin warm, dry, clear and intact without obvious erythema, effusion, or ecchymosis.  Palpation: TTP over distal c-spine; mild surrounding tenderness over trapezius ROM: LROM about the neck Strength: 5/5 shld abduction, 5/5 shld adduction, 5/5 elbow flexion, 5/5 elbow extension, 5/5 grip strength Skin: warm and dry Neurologic: Ambulates without difficulty; Sensation intact about the upper extremities Psychological: alert and cooperative; normal mood and affect  ASSESSMENT & PLAN:  1. Neck pain    Discussed patient case with Dr. Meda Coffee.  Recommended toradol shot and short course of prednisone.  Patient will follow up with PCP tomorrow for further evaluation and management.  Strict ED precautions given.    Meds ordered this encounter  Medications  . ketorolac (TORADOL) injection 60 mg  . DISCONTD: predniSONE (DELTASONE) 20 MG tablet    Sig: Take 1 tablet (20 mg total) by mouth 2 (two) times daily with a meal for 5 days.    Dispense:  10 tablet    Refill:  0    Order Specific Question:   Supervising Provider    Answer:   Raylene Everts [[id number]]    Continue conservative management of rest, ice, and gentle stretches Toradol shot given in office Prednisone prescribed.  Take as directed and to completion.  This medication may increase your blood sugar.   Follow up with PCP Monday for further evaluation and management Return or go to the ER if you have any new or worsening symptoms (fever, chills, chest pain, abdominal pain, changes in bowel or bladder habits, numbness/ tingling in arms, weakness in arms, etc...)   Reviewed expectations re:  course of current medical issues. Questions answered. Outlined signs and symptoms indicating  need for more acute intervention. Patient verbalized understanding. After Visit Summary given.    Lestine Box, PA-C 01/24/19 1851

## 2019-01-24 NOTE — Discharge Instructions (Signed)
Continue conservative management of rest, ice, and gentle stretches Toradol shot given in office Prednisone prescribed.  Take as directed and to completion.  This medication may increase your blood sugar.   Follow up with PCP Monday for further evaluation and management Return or go to the ER if you have any new or worsening symptoms (fever, chills, chest pain, abdominal pain, changes in bowel or bladder habits, numbness/ tingling in arms, weakness in arms, etc...)

## 2019-01-28 ENCOUNTER — Ambulatory Visit (INDEPENDENT_AMBULATORY_CARE_PROVIDER_SITE_OTHER): Payer: BLUE CROSS/BLUE SHIELD | Admitting: Internal Medicine

## 2019-01-28 ENCOUNTER — Encounter: Payer: Self-pay | Admitting: Internal Medicine

## 2019-01-28 VITALS — BP 116/82 | HR 65 | Temp 97.6°F | Ht 70.0 in | Wt 279.0 lb

## 2019-01-28 DIAGNOSIS — I2510 Atherosclerotic heart disease of native coronary artery without angina pectoris: Secondary | ICD-10-CM

## 2019-01-28 DIAGNOSIS — S139XXA Sprain of joints and ligaments of unspecified parts of neck, initial encounter: Secondary | ICD-10-CM | POA: Diagnosis not present

## 2019-01-28 NOTE — Assessment & Plan Note (Signed)
Marked restriction in ROM still but pain is better He seems to have done his own adjustment when falling forward to his truck Will continue with pain management for his back---and likely have neck evaluation at some point as well No acute Rx---continue the prednisone

## 2019-01-28 NOTE — Progress Notes (Signed)
Subjective:    Patient ID: [name], male    DOB: Aug 11, 1956, 63 y.o.   MRN: [id number]  HPI Here for ER follow up---seen for neck pain Reviewed records Apparent neck strain---got toradol injection and course of outpatient prednisone  Still with neck soreness Chronic back pain---then got sharp pain left low back walking to truck yesterday Legs and knees gave out and he fell forward---caught himself on his truck bed He felt a pop and pain----then he noticed improvement in both his neck and his back  Is scheduled to have a series of shots with his back  Current Outpatient Medications on File Prior to Visit  Medication Sig Dispense Refill  . atorvastatin (LIPITOR) 80 MG tablet TAKE 1 TABLET BY MOUTH  DAILY AT 6 PM. 90 tablet 3  . bisoprolol (ZEBETA) 10 MG tablet Take 1 tablet (10 mg total) by mouth daily. 90 tablet 3  . clopidogrel (PLAVIX) 75 MG tablet Take 1 tablet (75 mg total) by mouth daily. 30 tablet 11  . fluticasone (FLONASE) 50 MCG/ACT nasal spray Place 2 sprays into both nostrils daily. 16 g 0  . losartan (COZAAR) 25 MG tablet TAKE 1 TABLET BY MOUTH  DAILY 90 tablet 3  . metFORMIN (GLUCOPHAGE-XR) 500 MG 24 hr tablet Take 2 tablets (1,000 mg total) by mouth daily with breakfast. 60 tablet 11  . multivitamin (THERAGRAN) per tablet Take 1 tablet by mouth daily.     . nitroGLYCERIN (NITROSTAT) 0.4 MG SL tablet Place 1 tablet (0.4 mg total) under the tongue every 5 (five) minutes as needed. (Patient taking differently: Place 0.4 mg under the tongue every 5 (five) minutes as needed for chest pain. ) 25 tablet 2  . spironolactone (ALDACTONE) 25 MG tablet TAKE ONE-HALF TABLET BY  MOUTH DAILY 45 tablet 3  . tiotropium (SPIRIVA) 18 MCG inhalation capsule Place 1 capsule (18 mcg total) into inhaler and inhale daily. 30 capsule 11  . VENTOLIN HFA 108 (90 Base) MCG/ACT inhaler TAKE 2 PUFFS BY MOUTH EVERY 6 HOURS AS NEEDED FOR WHEEZE OR SHORTNESS OF BREATH *USE W/ SPACER* 18 Inhaler 3    . vitamin B-12 (CYANOCOBALAMIN) 1000 MCG tablet Take 1,000 mcg by mouth daily.      No current facility-administered medications on file prior to visit.     No Known Allergies  Past Medical History:  Diagnosis Date  . Arthritis   . Automatic implantable cardioverter-defibrillator in situ   . Benign essential HTN 09/17/2016  . COPD (chronic obstructive pulmonary disease) ([place])    emphysema by CXR  . Coronary artery disease     LAD a 60-70% stenosis, moderate size second diagonal with 50% stenosis, circumflex AV groove 75-80% stenosis with a branching obtuse marginal with a superior branch subtotal stenosis followed by diffuse disease, right coronary artery  had nonobstructive disease.  EF was 35%  . Diabetes mellitus    type 2  no meds  . Diverticulosis of colon   . GERD (gastroesophageal reflux disease)    with esophagitis  . History of colonic polyps   . Hypertension   . Non-ischemic cardiomyopathy ([place])   . Presence of permanent cardiac pacemaker   . PVD (peripheral vascular disease) ([place])    bilateral common iliac artery aneurysms. Right SFA occlusion over a long segment. Left SFA disease with occlusion of the left TP trunk. Artirogram Oct. 2006  . Shortness of breath    exertion  . Sleep apnea   . Tobacco abuse   .  Urinary incontinence    detrussor instability    Past Surgical History:  Procedure Laterality Date  . ABDOMINAL AORTIC ENDOVASCULAR STENT GRAFT N/A 05/07/2018   Procedure: ABDOMINAL AORTIC ENDOVASCULAR STENT GRAFT;  Surgeon: Conrad [place], MD;  Location: [facility];  Service: Vascular;  Laterality: N/A;  . CARDIAC CATHETERIZATION  06/2004   negative  . COLONOSCOPY  04/2005  . COPD exacerbation    . CORONARY ANGIOPLASTY  01/09/2018  . CORONARY STENT INTERVENTION N/A 01/10/2018   Procedure: CORONARY STENT INTERVENTION;  Surgeon: Lorretta Harp, MD;  Location: [facility];  Service: Cardiovascular;  Laterality: N/A;  . CORONARY/GRAFT ACUTE MI  REVASCULARIZATION N/A 01/10/2018   Procedure: Coronary/Graft Acute MI Revascularization;  Surgeon: Lorretta Harp, MD;  Location: [facility];  Service: Cardiovascular;  Laterality: N/A;  . EMBOLIZATION Left 03/19/2018  . EMBOLIZATION Left 03/19/2018   Procedure: EMBOLIZATION - Left Internal;  Surgeon: Conrad Strandquist, MD;  Location: [facility];  Service: Cardiovascular;  Laterality: Left;  . EMBOLIZATION Right 04/16/2018   Procedure: EMBOLIZATION;  Surgeon: Conrad [place], MD;  Location: [facility];  Service: Cardiovascular;  Laterality: Right;  . FEMORAL ARTERY EXPLORATION Right 05/07/2018   Procedure: FEMORAL ARTERY EXPLORATION, EXTENDED PROFUNDAPLASTY;  Surgeon: Conrad Peralta, MD;  Location: [facility];  Service: Vascular;  Laterality: Right;  . INTRAOPERATIVE ARTERIOGRAM Right 05/07/2018   Procedure: INTRA OPERATIVE ARTERIOGRAM;  Surgeon: Conrad [place], MD;  Location: [facility];  Service: Vascular;  Laterality: Right;  . LEFT HEART CATH AND CORONARY ANGIOGRAPHY N/A 01/10/2018   Procedure: LEFT HEART CATH AND CORONARY ANGIOGRAPHY;  Surgeon: Lorretta Harp, MD;  Location: [facility];  Service: Cardiovascular;  Laterality: N/A;  . PACEMAKER INSERTION  11/2005  . PACEMAKER LEAD REMOVAL N/A 10/17/2014   Procedure: PACEMAKER LEAD REMOVAL;  Surgeon: Evans Lance, MD;  Location: [facility];  Service: Cardiovascular;  Laterality: N/A;  . RENAL ANGIOGRAPHY Left 04/16/2018   Procedure: RENAL ANGIOGRAPHY;  Surgeon: Conrad Wadley, MD;  Location: [facility];  Service: Cardiovascular;  Laterality: Left;  . s/p ICD placement      Medtronic Maximo [phone] single chamber    Family History  Problem Relation Age of Onset  . Stroke Father   . Coronary artery disease Maternal Aunt   . Heart failure Maternal Aunt   . Lung cancer Maternal Aunt   . Lung cancer Maternal Uncle   . Cancer Brother        Mouth  . Cancer Sister        throat    Social History   Socioeconomic History    . Marital status: Married    Spouse name: Not on file  . Number of children: 2  . Years of education: Not on file  . Highest education level: Not on file  Occupational History  . Occupation: Grave digger--now disabled  Social Needs  . Financial resource strain: Not on file  . Food insecurity:    Worry: Not on file    Inability: Not on file  . Transportation needs:    Medical: Not on file    Non-medical: Not on file  Tobacco Use  . Smoking status: Former Smoker    Packs/day: 2.00    Years: 15.00    Pack years: 30.00    Types: Cigarettes    Last attempt to quit: 01/09/2018    Years since quitting: 1.0  . Smokeless tobacco: Never Used  .  Tobacco comment: GAVE 1-800-QUIT-NOW  Substance and Sexual Activity  . Alcohol use: No  . Drug use: No  . Sexual activity: Never  Lifestyle  . Physical activity:    Days per week: Not on file    Minutes per session: Not on file  . Stress: Not on file  Relationships  . Social connections:    Talks on phone: Not on file    Gets together: Not on file    Attends religious service: Not on file    Active member of club or organization: Not on file    Attends meetings of clubs or organizations: Not on file    Relationship status: Not on file  . Intimate partner violence:    Fear of current or ex partner: Not on file    Emotionally abused: Not on file    Physically abused: Not on file    Forced sexual activity: Not on file  Other Topics Concern  . Not on file  Social History Narrative   No living will   Requests wife as health care POA   Would accept resuscitation but doesn't want prolonged ventilation   No tube feeds if cognitively unaware   Review of Systems  May have brought on problems with working in garage (had awoken feeling good so he did some chores/moved boats/sweeping while sitting in chair     Objective:   Physical Exam  Neck:  No sig tenderness Flexion is okay Very limited extension----- little rotation or tilt also   Neurological:  Normal strength in arms           Assessment & Plan:

## 2019-02-09 ENCOUNTER — Ambulatory Visit (HOSPITAL_BASED_OUTPATIENT_CLINIC_OR_DEPARTMENT_OTHER): Payer: BLUE CROSS/BLUE SHIELD | Admitting: Physical Medicine & Rehabilitation

## 2019-02-09 ENCOUNTER — Encounter: Payer: BLUE CROSS/BLUE SHIELD | Attending: Physical Medicine & Rehabilitation

## 2019-02-09 ENCOUNTER — Encounter: Payer: Self-pay | Admitting: Physical Medicine & Rehabilitation

## 2019-02-09 VITALS — BP 124/74 | HR 61 | Resp 14 | Ht 70.0 in | Wt 279.0 lb

## 2019-02-09 DIAGNOSIS — E1151 Type 2 diabetes mellitus with diabetic peripheral angiopathy without gangrene: Secondary | ICD-10-CM | POA: Insufficient documentation

## 2019-02-09 DIAGNOSIS — I428 Other cardiomyopathies: Secondary | ICD-10-CM | POA: Diagnosis not present

## 2019-02-09 DIAGNOSIS — I2510 Atherosclerotic heart disease of native coronary artery without angina pectoris: Secondary | ICD-10-CM | POA: Insufficient documentation

## 2019-02-09 DIAGNOSIS — Z800 Family history of malignant neoplasm of digestive organs: Secondary | ICD-10-CM | POA: Insufficient documentation

## 2019-02-09 DIAGNOSIS — J439 Emphysema, unspecified: Secondary | ICD-10-CM | POA: Insufficient documentation

## 2019-02-09 DIAGNOSIS — Z801 Family history of malignant neoplasm of trachea, bronchus and lung: Secondary | ICD-10-CM | POA: Diagnosis not present

## 2019-02-09 DIAGNOSIS — Z950 Presence of cardiac pacemaker: Secondary | ICD-10-CM | POA: Insufficient documentation

## 2019-02-09 DIAGNOSIS — M25512 Pain in left shoulder: Secondary | ICD-10-CM | POA: Insufficient documentation

## 2019-02-09 DIAGNOSIS — Z9861 Coronary angioplasty status: Secondary | ICD-10-CM | POA: Diagnosis not present

## 2019-02-09 DIAGNOSIS — Z87891 Personal history of nicotine dependence: Secondary | ICD-10-CM | POA: Insufficient documentation

## 2019-02-09 DIAGNOSIS — G4730 Sleep apnea, unspecified: Secondary | ICD-10-CM | POA: Insufficient documentation

## 2019-02-09 DIAGNOSIS — I10 Essential (primary) hypertension: Secondary | ICD-10-CM | POA: Diagnosis not present

## 2019-02-09 DIAGNOSIS — M47817 Spondylosis without myelopathy or radiculopathy, lumbosacral region: Secondary | ICD-10-CM | POA: Insufficient documentation

## 2019-02-09 DIAGNOSIS — G8929 Other chronic pain: Secondary | ICD-10-CM | POA: Diagnosis not present

## 2019-02-09 DIAGNOSIS — Z8249 Family history of ischemic heart disease and other diseases of the circulatory system: Secondary | ICD-10-CM | POA: Insufficient documentation

## 2019-02-09 NOTE — Progress Notes (Signed)
  PROCEDURE RECORD [facility]   Name: [name] DOB:[birth year] MRN: [id number]  Date:02/09/2019  Physician: Alysia Penna, MD    Nurse/CMA: Truman Hayward, CMA  Allergies: No Known Allergies  Consent Signed: Yes.    Is patient diabetic? Yes.    CBG today? ???  Pregnant: No. LMP: No LMP for male patient. (age [age])  Anticoagulants: no Anti-inflammatory: no Antibiotics: no  Procedure: bilateral lumbar 3,4,5 medial branch block  Position: Prone Start Time: 11:34am  End Time: 11:46am  Fluoro Time: 42  RN/CMA Osiah Haring, CMA Lee, CMA    Time 11:10am 11:51am    BP 124/74 116/72    Pulse 61 67    Respirations 14 14    O2 Sat 94 96    S/S 6 6    Pain Level 8/10 3/10     D/C home with wife, patient A & O X 3, D/C instructions reviewed, and sits independently.

## 2019-02-09 NOTE — Progress Notes (Signed)

## 2019-02-09 NOTE — Patient Instructions (Signed)

## 2019-03-08 ENCOUNTER — Encounter: Payer: Self-pay | Admitting: Physical Medicine & Rehabilitation

## 2019-03-08 ENCOUNTER — Other Ambulatory Visit: Payer: Self-pay

## 2019-03-08 ENCOUNTER — Encounter: Payer: BLUE CROSS/BLUE SHIELD | Attending: Physical Medicine & Rehabilitation

## 2019-03-08 ENCOUNTER — Ambulatory Visit (HOSPITAL_BASED_OUTPATIENT_CLINIC_OR_DEPARTMENT_OTHER): Payer: BLUE CROSS/BLUE SHIELD | Admitting: Physical Medicine & Rehabilitation

## 2019-03-08 VITALS — BP 146/90 | HR 83 | Ht 70.0 in | Wt 285.0 lb

## 2019-03-08 DIAGNOSIS — I2510 Atherosclerotic heart disease of native coronary artery without angina pectoris: Secondary | ICD-10-CM | POA: Insufficient documentation

## 2019-03-08 DIAGNOSIS — I10 Essential (primary) hypertension: Secondary | ICD-10-CM | POA: Insufficient documentation

## 2019-03-08 DIAGNOSIS — J439 Emphysema, unspecified: Secondary | ICD-10-CM | POA: Insufficient documentation

## 2019-03-08 DIAGNOSIS — M533 Sacrococcygeal disorders, not elsewhere classified: Secondary | ICD-10-CM

## 2019-03-08 DIAGNOSIS — Z801 Family history of malignant neoplasm of trachea, bronchus and lung: Secondary | ICD-10-CM | POA: Insufficient documentation

## 2019-03-08 DIAGNOSIS — M25512 Pain in left shoulder: Secondary | ICD-10-CM | POA: Diagnosis not present

## 2019-03-08 DIAGNOSIS — Z9861 Coronary angioplasty status: Secondary | ICD-10-CM | POA: Diagnosis not present

## 2019-03-08 DIAGNOSIS — G4730 Sleep apnea, unspecified: Secondary | ICD-10-CM | POA: Insufficient documentation

## 2019-03-08 DIAGNOSIS — Z8249 Family history of ischemic heart disease and other diseases of the circulatory system: Secondary | ICD-10-CM | POA: Insufficient documentation

## 2019-03-08 DIAGNOSIS — I428 Other cardiomyopathies: Secondary | ICD-10-CM | POA: Insufficient documentation

## 2019-03-08 DIAGNOSIS — M47817 Spondylosis without myelopathy or radiculopathy, lumbosacral region: Secondary | ICD-10-CM | POA: Diagnosis not present

## 2019-03-08 DIAGNOSIS — E1151 Type 2 diabetes mellitus with diabetic peripheral angiopathy without gangrene: Secondary | ICD-10-CM | POA: Diagnosis not present

## 2019-03-08 DIAGNOSIS — Z800 Family history of malignant neoplasm of digestive organs: Secondary | ICD-10-CM | POA: Insufficient documentation

## 2019-03-08 DIAGNOSIS — G8929 Other chronic pain: Secondary | ICD-10-CM | POA: Diagnosis not present

## 2019-03-08 DIAGNOSIS — Z87891 Personal history of nicotine dependence: Secondary | ICD-10-CM | POA: Diagnosis not present

## 2019-03-08 DIAGNOSIS — Z950 Presence of cardiac pacemaker: Secondary | ICD-10-CM | POA: Diagnosis not present

## 2019-03-08 NOTE — Progress Notes (Signed)
Subjective:    Patient ID: [name], male    DOB: 05/20/1956, 63 y.o.   MRN: [id number]  HPI  63 year old male with primary complaint of low back pain.  This pain does not radiate into the legs.  He has pain more on the right side than on the left side.  Pain does increase with bending.  The patient is very inactive.  He has difficulty getting his socks on.  He states that his feet are numb from diabetes 02/09/2019  Bilateral Lumbar L3, L4  medial branch blocks and L 5 dorsal ramus injection under fluoroscopic guidance Pt not sure if the injection was helpful.  Pre injection 8/10, post injection was 3/10 but this was seated (position of comfort), increased again when walking to the car  Hx of PAD bilateral LE , underwent iliac aneurysm repair  Hx pacemaker removal  Not very active , has gained 5lb  Took tylenol regular strength 3-4 tabs per day, this gives him good relief he only takes it once a day we discussed that he should not take more than 3 regular strength Tylenol as at one time and that he can take this at least a couple times a day  Discussed therapy which pt declines, states he is tried this in the past and he states that he would not keep up with a home exercise program  Discussed role of weight loss, ROM, strength exercise Pain Inventory Average Pain 7 Pain Right Now 7 My pain is na  In the last 24 hours, has pain interfered with the following? General activity 7 Relation with others 0 Enjoyment of life 0 What TIME of day is your pain at its worst? daytime Sleep (in general) Fair  Pain is worse with: walking, bending, standing and some activites Pain improves with: rest Relief from Meds: 0  Mobility use a cane use a walker ability to climb steps?  no do you drive?  yes  Function disabled: date disabled 2005  Neuro/Psych trouble walking  Prior Studies Any changes since last visit?  no  Physicians involved in your care Any changes since last  visit?  no   Family History  Problem Relation Age of Onset  . Stroke Father   . Coronary artery disease Maternal Aunt   . Heart failure Maternal Aunt   . Lung cancer Maternal Aunt   . Lung cancer Maternal Uncle   . Cancer Brother        Mouth  . Cancer Sister        throat   Social History   Socioeconomic History  . Marital status: Married    Spouse name: Not on file  . Number of children: 2  . Years of education: Not on file  . Highest education level: Not on file  Occupational History  . Occupation: Grave digger--now disabled  Social Needs  . Financial resource strain: Not on file  . Food insecurity:    Worry: Not on file    Inability: Not on file  . Transportation needs:    Medical: Not on file    Non-medical: Not on file  Tobacco Use  . Smoking status: Former Smoker    Packs/day: 2.00    Years: 15.00    Pack years: 30.00    Types: Cigarettes    Last attempt to quit: 01/09/2018    Years since quitting: 1.1  . Smokeless tobacco: Never Used  . Tobacco comment: GAVE 1-800-QUIT-NOW  Substance and Sexual Activity  .  Alcohol use: No  . Drug use: No  . Sexual activity: Never  Lifestyle  . Physical activity:    Days per week: Not on file    Minutes per session: Not on file  . Stress: Not on file  Relationships  . Social connections:    Talks on phone: Not on file    Gets together: Not on file    Attends religious service: Not on file    Active member of club or organization: Not on file    Attends meetings of clubs or organizations: Not on file    Relationship status: Not on file  Other Topics Concern  . Not on file  Social History Narrative   No living will   Requests wife as health care POA   Would accept resuscitation but doesn't want prolonged ventilation   No tube feeds if cognitively unaware   Past Surgical History:  Procedure Laterality Date  . ABDOMINAL AORTIC ENDOVASCULAR STENT GRAFT N/A 05/07/2018   Procedure: ABDOMINAL AORTIC ENDOVASCULAR  STENT GRAFT;  Surgeon: Conrad [place], MD;  Location: [place];  Service: Vascular;  Laterality: N/A;  . CARDIAC CATHETERIZATION  06/2004   negative  . COLONOSCOPY  04/2005  . COPD exacerbation    . CORONARY ANGIOPLASTY  01/09/2018  . CORONARY STENT INTERVENTION N/A 01/10/2018   Procedure: CORONARY STENT INTERVENTION;  Surgeon: Lorretta Harp, MD;  Location: [place] [facility];  Service: Cardiovascular;  Laterality: N/A;  . CORONARY/GRAFT ACUTE MI REVASCULARIZATION N/A 01/10/2018   Procedure: Coronary/Graft Acute MI Revascularization;  Surgeon: Lorretta Harp, MD;  Location: [place] [facility];  Service: Cardiovascular;  Laterality: N/A;  . EMBOLIZATION Left 03/19/2018  . EMBOLIZATION Left 03/19/2018   Procedure: EMBOLIZATION - Left Internal;  Surgeon: Conrad Atwater, MD;  Location: [place] [facility];  Service: Cardiovascular;  Laterality: Left;  . EMBOLIZATION Right 04/16/2018   Procedure: EMBOLIZATION;  Surgeon: Conrad Tavistock, MD;  Location: [place] [facility];  Service: Cardiovascular;  Laterality: Right;  . FEMORAL ARTERY EXPLORATION Right 05/07/2018   Procedure: FEMORAL ARTERY EXPLORATION, EXTENDED PROFUNDAPLASTY;  Surgeon: Conrad Vidalia, MD;  Location: [place];  Service: Vascular;  Laterality: Right;  . INTRAOPERATIVE ARTERIOGRAM Right 05/07/2018   Procedure: INTRA OPERATIVE ARTERIOGRAM;  Surgeon: Conrad , MD;  Location: [place];  Service: Vascular;  Laterality: Right;  . LEFT HEART CATH AND CORONARY ANGIOGRAPHY N/A 01/10/2018   Procedure: LEFT HEART CATH AND CORONARY ANGIOGRAPHY;  Surgeon: Lorretta Harp, MD;  Location: [place] [facility];  Service: Cardiovascular;  Laterality: N/A;  . PACEMAKER INSERTION  11/2005  . PACEMAKER LEAD REMOVAL N/A 10/17/2014   Procedure: PACEMAKER LEAD REMOVAL;  Surgeon: Evans Lance, MD;  Location: [place];  Service: Cardiovascular;  Laterality: N/A;  . RENAL ANGIOGRAPHY Left 04/16/2018   Procedure: RENAL ANGIOGRAPHY;  Surgeon: Conrad , MD;   Location: [place] [facility];  Service: Cardiovascular;  Laterality: Left;  . s/p ICD placement      Medtronic Maximo [phone] single chamber   Past Medical History:  Diagnosis Date  . Arthritis   . Automatic implantable cardioverter-defibrillator in situ   . Benign essential HTN 09/17/2016  . COPD (chronic obstructive pulmonary disease) ([place])    emphysema by CXR  . Coronary artery disease     LAD a 60-70% stenosis, moderate size second diagonal with 50% stenosis, circumflex AV groove 75-80% stenosis with a branching obtuse marginal with a superior branch subtotal stenosis followed by  diffuse disease, right coronary artery  had nonobstructive disease.  EF was 35%  . Diabetes mellitus    type 2  no meds  . Diverticulosis of colon   . GERD (gastroesophageal reflux disease)    with esophagitis  . History of colonic polyps   . Hypertension   . Non-ischemic cardiomyopathy ([place])   . Presence of permanent cardiac pacemaker   . PVD (peripheral vascular disease) ([place])    bilateral common iliac artery aneurysms. Right SFA occlusion over a long segment. Left SFA disease with occlusion of the left TP trunk. Artirogram Oct. 2006  . Shortness of breath    exertion  . Sleep apnea   . Tobacco abuse   . Urinary incontinence    detrussor instability   There were no vitals taken for this visit.  Opioid Risk Score:   Fall Risk Score:  `1  Depression screen PHQ 2/9  Depression screen [facility] 2/9 01/19/2019 12/04/2018 10/28/2014  Decreased Interest 3 0 0  Down, Depressed, Hopeless 1 0 0  PHQ - 2 Score 4 0 0  Altered sleeping 3 - -  Tired, decreased energy 2 - -  Change in appetite 1 - -  Feeling bad or failure about yourself  0 - -  Trouble concentrating 0 - -  Moving slowly or fidgety/restless 0 - -  Suicidal thoughts 0 - -  PHQ-9 Score 10 - -  Difficult doing work/chores Not difficult at all - -  Some recent data might be hidden     Review of Systems  Constitutional: Negative.   HENT:  Negative.   Eyes: Negative.   Respiratory: Positive for apnea and shortness of breath.   Cardiovascular: Negative.   Gastrointestinal: Negative.   Endocrine: Negative.   Genitourinary: Negative.   Musculoskeletal: Positive for arthralgias, back pain, gait problem and myalgias.  Skin: Negative.   Allergic/Immunologic: Negative.   Hematological: Bruises/bleeds easily.  Psychiatric/Behavioral: Negative.   All other systems reviewed and are negative.      Objective:   Physical Exam Mood and affect are appropriate General no acute distress Motor strength is 5/5 bilateral hip flexors knee extensors ankle dorsiflexors Sensation intact to pinprick using sterile pin on both lower extremities. Deep tendon reflexes are 0 bilateral upper extremities and 3+ bilateral knees and ankles. Negative straight leg raising Positive Faber's causing pain in the right SI joint area No pain with hip internal and external rotation no pain with knee range of motion no tenderness to palpation over the medial and lateral joint lines of the knee.  No evidence of knee effusion No evidence of ankle effusion Feet show no evidence of skin breakdown no hypersensitivity no dysvascular changes       Assessment & Plan:  1.  Chronic low back pain.  He had an equivocal response to the lumbar medial branch blocks.  He thinks that the relief he experienced right after the procedure was because he was in the seated position. On exam today he has primarily right-sided low back pain he does have localizing signs to the right sacroiliac joint.  He has no sign of radiculopathy.  We will do diagnostic/therapeutic right sacroiliac injection under fluoroscopic guidance. We discussed the importance of weight loss increasing his range of motion and strength.  He however does not feel his pain would allow him to exercise sufficiently.  He would like to do more fishing and other outdoor activities such as gardening.  This may be a  goal for him at  least to increase his activity level 2.  Probable diabetic neuropathy although he does have intact pinprick this may be impacting his balance 3.  Left shoulder pain resolved after subacromial injection 4.  Hyperreflexia both lower extremities with decreased reflexes in the upper extremities.  May have some cervical spondylosis with multilevel radiculopathy.  He does not have any significant neck pain at this time Last CT of the cervical spine 05/05/2014 demonstrated cervical degenerative changes with spondylosis no significant stenosis noted

## 2019-03-09 ENCOUNTER — Other Ambulatory Visit: Payer: Self-pay | Admitting: Physician Assistant

## 2019-03-09 MED ORDER — PLAVIX 75 MG PO TABS: 75.0000 mg | ORAL_TABLET | Freq: Every day | ORAL | 2 refills | Status: DC

## 2019-03-31 ENCOUNTER — Telehealth: Payer: Self-pay | Admitting: *Deleted

## 2019-03-31 NOTE — Telephone Encounter (Signed)
   Primary Cardiologist:  Minus Breeding, MD   Patient contacted.  History reviewed.  No symptoms to suggest any unstable cardiac conditions.  Based on discussion, with current pandemic situation, we will be postponing this appointment for [name] with a plan for f/u in greater than 12 wks or sooner if feasible/necessary.  If symptoms change, he has been instructed to contact our office.   Routing to C19 CANCEL pool for tracking (P CV DIV CV19 CANCEL - reason for visit "other.") and assigning priority (1 = 4-6 wks, 2 = 6-12 wks, 3 = >12 wks).   Barbaraann Barthel  03/31/2019 11:44 AM         .

## 2019-04-05 ENCOUNTER — Ambulatory Visit: Payer: BLUE CROSS/BLUE SHIELD | Admitting: Cardiology

## 2019-04-08 ENCOUNTER — Ambulatory Visit: Payer: BLUE CROSS/BLUE SHIELD | Admitting: Physical Medicine & Rehabilitation

## 2019-04-09 NOTE — Telephone Encounter (Signed)
Patient refused a virtual visit.  He wanted to waited until he can be seen in office, he scheduled an August appt.

## 2019-05-01 ENCOUNTER — Other Ambulatory Visit: Payer: Self-pay | Admitting: Internal Medicine

## 2019-05-26 ENCOUNTER — Other Ambulatory Visit: Payer: Self-pay | Admitting: Internal Medicine

## 2019-05-31 ENCOUNTER — Other Ambulatory Visit: Payer: Self-pay | Admitting: Physician Assistant

## 2019-06-08 ENCOUNTER — Other Ambulatory Visit: Payer: Self-pay

## 2019-06-08 ENCOUNTER — Ambulatory Visit (INDEPENDENT_AMBULATORY_CARE_PROVIDER_SITE_OTHER): Payer: BC Managed Care – PPO | Admitting: Internal Medicine

## 2019-06-08 ENCOUNTER — Encounter: Payer: Self-pay | Admitting: Internal Medicine

## 2019-06-08 VITALS — BP 122/78 | HR 62 | Temp 97.8°F | Ht 70.0 in | Wt 287.0 lb

## 2019-06-08 DIAGNOSIS — I25119 Atherosclerotic heart disease of native coronary artery with unspecified angina pectoris: Secondary | ICD-10-CM | POA: Diagnosis not present

## 2019-06-08 DIAGNOSIS — E1151 Type 2 diabetes mellitus with diabetic peripheral angiopathy without gangrene: Secondary | ICD-10-CM

## 2019-06-08 DIAGNOSIS — I5022 Chronic systolic (congestive) heart failure: Secondary | ICD-10-CM

## 2019-06-08 DIAGNOSIS — J439 Emphysema, unspecified: Secondary | ICD-10-CM | POA: Diagnosis not present

## 2019-06-08 DIAGNOSIS — E1140 Type 2 diabetes mellitus with diabetic neuropathy, unspecified: Secondary | ICD-10-CM

## 2019-06-08 DIAGNOSIS — F39 Unspecified mood [affective] disorder: Secondary | ICD-10-CM

## 2019-06-08 LAB — POCT GLYCOSYLATED HEMOGLOBIN (HGB A1C): Hemoglobin A1C: 9.8 % — AB (ref 4.0–5.6)

## 2019-06-08 MED ORDER — GLIPIZIDE 5 MG PO TABS: 5.0000 mg | ORAL_TABLET | Freq: Two times a day (BID) | ORAL | 3 refills | Status: DC

## 2019-06-08 NOTE — Assessment & Plan Note (Signed)
Not exacerbated 

## 2019-06-08 NOTE — Assessment & Plan Note (Signed)
Dysthymia Some degree of anhedonia No MDD

## 2019-06-08 NOTE — Assessment & Plan Note (Signed)
Increased angina pattern lately--but always responds to 1 nitro Discussed lifestyle 911 for persistent pain

## 2019-06-08 NOTE — Assessment & Plan Note (Signed)
Some increased inhaler need but doesn't seem to be exacerbated

## 2019-06-08 NOTE — Assessment & Plan Note (Signed)
Lab Results  Component Value Date   HGBA1C 9.8 (A) 06/08/2019   Out of control Insurance won't cover sodium glucose cotransport or GLP-1 agonist meds Will add glipizide He needs to improve eating Can't exercise

## 2019-06-08 NOTE — Progress Notes (Signed)
Subjective:    Patient ID: [name], male    DOB: Feb 15, 1956, 63 y.o.   MRN: [id number]  HPI Here for follow up of diabetes and other chronic health conditions  Doing okay Getting chest pain lately---goes away with 1 nitro Comes on at rest---but very sedentary (can't even carry groceries or walk around a store)  Tries to eat right Not checking sugars  No edema Gets SOB lately---using the inhaler more (like if he goes out and does anything) Seems to be related to his heart Does use the spiriva daily  Current Outpatient Medications on File Prior to Visit  Medication Sig Dispense Refill  . atorvastatin (LIPITOR) 80 MG tablet TAKE 1 TABLET BY MOUTH  DAILY AT 6 PM. 90 tablet 3  . bisoprolol (ZEBETA) 10 MG tablet Take 1 tablet (10 mg total) by mouth daily. 90 tablet 3  . clopidogrel (PLAVIX) 75 MG tablet TAKE 1 TABLET BY MOUTH EVERY DAY 30 tablet 2  . fluticasone (FLONASE) 50 MCG/ACT nasal spray Place 2 sprays into both nostrils daily. 16 g 0  . losartan (COZAAR) 25 MG tablet TAKE 1 TABLET BY MOUTH  DAILY 90 tablet 3  . metFORMIN (GLUCOPHAGE-XR) 500 MG 24 hr tablet TAKE 2 TABLETS BY MOUTH EVERY DAY WITH BREAKFAST 60 tablet 5  . multivitamin (THERAGRAN) per tablet Take 1 tablet by mouth daily.     . nitroGLYCERIN (NITROSTAT) 0.4 MG SL tablet Place 1 tablet (0.4 mg total) under the tongue every 5 (five) minutes as needed. (Patient taking differently: Place 0.4 mg under the tongue every 5 (five) minutes as needed for chest pain. ) 25 tablet 2  . spironolactone (ALDACTONE) 25 MG tablet TAKE ONE-HALF TABLET BY  MOUTH DAILY 45 tablet 3  . tiotropium (SPIRIVA) 18 MCG inhalation capsule Place 1 capsule (18 mcg total) into inhaler and inhale daily. 30 capsule 11  . VENTOLIN HFA 108 (90 Base) MCG/ACT inhaler TAKE 2 PUFFS BY MOUTH EVERY 6 HOURS AS NEEDED FOR WHEEZE OR SHORTNESS OF BREATH *USE W/ SPACER* 18 Inhaler 1  . vitamin B-12 (CYANOCOBALAMIN) 1000 MCG tablet Take 1,000 mcg by mouth  daily.      No current facility-administered medications on file prior to visit.     No Known Allergies  Past Medical History:  Diagnosis Date  . Arthritis   . Automatic implantable cardioverter-defibrillator in situ   . Benign essential HTN 09/17/2016  . COPD (chronic obstructive pulmonary disease) ([place])    emphysema by CXR  . Coronary artery disease     LAD a 60-70% stenosis, moderate size second diagonal with 50% stenosis, circumflex AV groove 75-80% stenosis with a branching obtuse marginal with a superior branch subtotal stenosis followed by diffuse disease, right coronary artery  had nonobstructive disease.  EF was 35%  . Diabetes mellitus    type 2  no meds  . Diverticulosis of colon   . GERD (gastroesophageal reflux disease)    with esophagitis  . History of colonic polyps   . Hypertension   . Non-ischemic cardiomyopathy ([place])   . Presence of permanent cardiac pacemaker   . PVD (peripheral vascular disease) ([place])    bilateral common iliac artery aneurysms. Right SFA occlusion over a long segment. Left SFA disease with occlusion of the left TP trunk. Artirogram Oct. 2006  . Shortness of breath    exertion  . Sleep apnea   . Tobacco abuse   . Urinary incontinence    detrussor instability  Past Surgical History:  Procedure Laterality Date  . ABDOMINAL AORTIC ENDOVASCULAR STENT GRAFT N/A 05/07/2018   Procedure: ABDOMINAL AORTIC ENDOVASCULAR STENT GRAFT;  Surgeon: Conrad Landis, MD;  Location: [place];  Service: Vascular;  Laterality: N/A;  . CARDIAC CATHETERIZATION  06/2004   negative  . COLONOSCOPY  04/2005  . COPD exacerbation    . CORONARY ANGIOPLASTY  01/09/2018  . CORONARY STENT INTERVENTION N/A 01/10/2018   Procedure: CORONARY STENT INTERVENTION;  Surgeon: Lorretta Harp, MD;  Location: [place] [facility];  Service: Cardiovascular;  Laterality: N/A;  . CORONARY/GRAFT ACUTE MI REVASCULARIZATION N/A 01/10/2018   Procedure: Coronary/Graft Acute MI  Revascularization;  Surgeon: Lorretta Harp, MD;  Location: [place] [facility];  Service: Cardiovascular;  Laterality: N/A;  . EMBOLIZATION Left 03/19/2018  . EMBOLIZATION Left 03/19/2018   Procedure: EMBOLIZATION - Left Internal;  Surgeon: Conrad Headland, MD;  Location: [place] [facility];  Service: Cardiovascular;  Laterality: Left;  . EMBOLIZATION Right 04/16/2018   Procedure: EMBOLIZATION;  Surgeon: Conrad [place], MD;  Location: [place] [facility];  Service: Cardiovascular;  Laterality: Right;  . FEMORAL ARTERY EXPLORATION Right 05/07/2018   Procedure: FEMORAL ARTERY EXPLORATION, EXTENDED PROFUNDAPLASTY;  Surgeon: Conrad St. [place], MD;  Location: [place];  Service: Vascular;  Laterality: Right;  . INTRAOPERATIVE ARTERIOGRAM Right 05/07/2018   Procedure: INTRA OPERATIVE ARTERIOGRAM;  Surgeon: Conrad [place], MD;  Location: [place];  Service: Vascular;  Laterality: Right;  . LEFT HEART CATH AND CORONARY ANGIOGRAPHY N/A 01/10/2018   Procedure: LEFT HEART CATH AND CORONARY ANGIOGRAPHY;  Surgeon: Lorretta Harp, MD;  Location: [place] [facility];  Service: Cardiovascular;  Laterality: N/A;  . PACEMAKER INSERTION  11/2005  . PACEMAKER LEAD REMOVAL N/A 10/17/2014   Procedure: PACEMAKER LEAD REMOVAL;  Surgeon: Evans Lance, MD;  Location: [place];  Service: Cardiovascular;  Laterality: N/A;  . RENAL ANGIOGRAPHY Left 04/16/2018   Procedure: RENAL ANGIOGRAPHY;  Surgeon: Conrad Warrenton, MD;  Location: [place] [facility];  Service: Cardiovascular;  Laterality: Left;  . s/p ICD placement      Medtronic Maximo [phone] single chamber    Family History  Problem Relation Age of Onset  . Stroke Father   . Coronary artery disease Maternal Aunt   . Heart failure Maternal Aunt   . Lung cancer Maternal Aunt   . Lung cancer Maternal Uncle   . Cancer Brother        Mouth  . Cancer Sister        throat    Social History   Socioeconomic History  . Marital status: Married    Spouse name: Not on file  . Number of  children: 2  . Years of education: Not on file  . Highest education level: Not on file  Occupational History  . Occupation: Grave digger--now disabled  Social Needs  . Financial resource strain: Not on file  . Food insecurity    Worry: Not on file    Inability: Not on file  . Transportation needs    Medical: Not on file    Non-medical: Not on file  Tobacco Use  . Smoking status: Former Smoker    Packs/day: 2.00    Years: 15.00    Pack years: 30.00    Types: Cigarettes    Quit date: 01/09/2018    Years since quitting: 1.4  . Smokeless tobacco: Never Used  . Tobacco comment: GAVE 1-800-QUIT-NOW  Substance and Sexual Activity  .  Alcohol use: No  . Drug use: No  . Sexual activity: Never  Lifestyle  . Physical activity    Days per week: Not on file    Minutes per session: Not on file  . Stress: Not on file  Relationships  . Social Herbalist on phone: Not on file    Gets together: Not on file    Attends religious service: Not on file    Active member of club or organization: Not on file    Attends meetings of clubs or organizations: Not on file    Relationship status: Not on file  . Intimate partner violence    Fear of current or ex partner: Not on file    Emotionally abused: Not on file    Physically abused: Not on file    Forced sexual activity: Not on file  Other Topics Concern  . Not on file  Social History Narrative   No living will   Requests wife as health care POA   Would accept resuscitation but doesn't want prolonged ventilation   No tube feeds if cognitively unaware   Review of Systems Legs and back hurt as well. Known disc disease and vascular occlusion in legs Weight up slightly Sleep is not great---sleep apnea but doesn't use the machine (can't deal with the mask, etc)    Objective:   Physical Exam  Constitutional: No distress.  Cardiovascular: Normal rate, regular rhythm and normal heart sounds. Exam reveals no gallop.  No murmur heard.  Feet cool without palpable pulses  Respiratory: Effort normal. No respiratory distress. He has no wheezes. He has no rales.  Decreased breath sounds but clear  Musculoskeletal:        General: No edema.  Skin:  No foot lesions  Psychiatric:  Frustrated Some degree of fatalism           Assessment & Plan:

## 2019-06-29 ENCOUNTER — Ambulatory Visit (INDEPENDENT_AMBULATORY_CARE_PROVIDER_SITE_OTHER)
Admission: RE | Admit: 2019-06-29 | Discharge: 2019-06-29 | Disposition: A | Discharge status: Home / Self Care | Payer: BC Managed Care – PPO | Source: Ambulatory Visit | Attending: Internal Medicine | Admitting: Internal Medicine

## 2019-06-29 ENCOUNTER — Encounter: Payer: Self-pay | Admitting: Internal Medicine

## 2019-06-29 ENCOUNTER — Ambulatory Visit (INDEPENDENT_AMBULATORY_CARE_PROVIDER_SITE_OTHER): Payer: BC Managed Care – PPO | Admitting: Internal Medicine

## 2019-06-29 ENCOUNTER — Other Ambulatory Visit: Payer: Self-pay

## 2019-06-29 DIAGNOSIS — M25562 Pain in left knee: Secondary | ICD-10-CM

## 2019-06-29 DIAGNOSIS — I25119 Atherosclerotic heart disease of native coronary artery with unspecified angina pectoris: Secondary | ICD-10-CM

## 2019-06-29 NOTE — Patient Instructions (Signed)
If your knee is not any better in the next 1-2 weeks--let me know and I will set you up with an orthopedist.

## 2019-06-29 NOTE — Assessment & Plan Note (Addendum)
May have meniscus tear Some apparent effusion May benefit from aspiration/steroids Will check x-ray first  X-ray benign Didn't really see effusion---so will hold off on aspiration/steroids Discussed brace Consider ortho eval if not improving

## 2019-06-29 NOTE — Progress Notes (Signed)
Subjective:    Patient ID: [name], male    DOB: [birth year], 63 y.o.   MRN: [id number]  HPI Here due to left knee pain Started around 2 weeks ago Had been walking more--but no clear injury Now needs crutches and handicapped scooter  Throbs at night Pain along medial border No clear swelling No redness or inflamed  Current Outpatient Medications on File Prior to Visit  Medication Sig Dispense Refill  . atorvastatin (LIPITOR) 80 MG tablet TAKE 1 TABLET BY MOUTH  DAILY AT 6 PM. 90 tablet 3  . bisoprolol (ZEBETA) 10 MG tablet Take 1 tablet (10 mg total) by mouth daily. 90 tablet 3  . clopidogrel (PLAVIX) 75 MG tablet TAKE 1 TABLET BY MOUTH EVERY DAY 30 tablet 2  . glipiZIDE (GLUCOTROL) 5 MG tablet Take 1 tablet (5 mg total) by mouth 2 (two) times daily before a meal. 180 tablet 3  . losartan (COZAAR) 25 MG tablet TAKE 1 TABLET BY MOUTH  DAILY 90 tablet 3  . metFORMIN (GLUCOPHAGE-XR) 500 MG 24 hr tablet TAKE 2 TABLETS BY MOUTH EVERY DAY WITH BREAKFAST 60 tablet 5  . multivitamin (THERAGRAN) per tablet Take 1 tablet by mouth daily.     . nitroGLYCERIN (NITROSTAT) 0.4 MG SL tablet Place 1 tablet (0.4 mg total) under the tongue every 5 (five) minutes as needed. (Patient taking differently: Place 0.4 mg under the tongue every 5 (five) minutes as needed for chest pain. ) 25 tablet 2  . spironolactone (ALDACTONE) 25 MG tablet TAKE ONE-HALF TABLET BY  MOUTH DAILY 45 tablet 3  . tiotropium (SPIRIVA) 18 MCG inhalation capsule Place 1 capsule (18 mcg total) into inhaler and inhale daily. 30 capsule 11  . VENTOLIN HFA 108 (90 Base) MCG/ACT inhaler TAKE 2 PUFFS BY MOUTH EVERY 6 HOURS AS NEEDED FOR WHEEZE OR SHORTNESS OF BREATH *USE W/ SPACER* 18 Inhaler 1  . vitamin B-12 (CYANOCOBALAMIN) 1000 MCG tablet Take 1,000 mcg by mouth daily.      No current facility-administered medications on file prior to visit.     No Known Allergies  Past Medical History:  Diagnosis Date  . Arthritis   .  Automatic implantable cardioverter-defibrillator in situ   . Benign essential HTN 09/17/2016  . COPD (chronic obstructive pulmonary disease) ([place])    emphysema by CXR  . Coronary artery disease     LAD a 60-70% stenosis, moderate size second diagonal with 50% stenosis, circumflex AV groove 75-80% stenosis with a branching obtuse marginal with a superior branch subtotal stenosis followed by diffuse disease, right coronary artery  had nonobstructive disease.  EF was 35%  . Diabetes mellitus    type 2  no meds  . Diverticulosis of colon   . GERD (gastroesophageal reflux disease)    with esophagitis  . History of colonic polyps   . Hypertension   . Non-ischemic cardiomyopathy ([place])   . Presence of permanent cardiac pacemaker   . PVD (peripheral vascular disease) ([place])    bilateral common iliac artery aneurysms. Right SFA occlusion over a long segment. Left SFA disease with occlusion of the left TP trunk. Artirogram Oct. 2006  . Shortness of breath    exertion  . Sleep apnea   . Tobacco abuse   . Urinary incontinence    detrussor instability    Past Surgical History:  Procedure Laterality Date  . ABDOMINAL AORTIC ENDOVASCULAR STENT GRAFT N/A 05/07/2018   Procedure: ABDOMINAL AORTIC ENDOVASCULAR STENT GRAFT;  Surgeon: Adele Barthel  L, MD;  Location: [facility] [facility];  Service: Vascular;  Laterality: N/A;  . CARDIAC CATHETERIZATION  06/2004   negative  . COLONOSCOPY  04/2005  . COPD exacerbation    . CORONARY ANGIOPLASTY  01/09/2018  . CORONARY STENT INTERVENTION N/A 01/10/2018   Procedure: CORONARY STENT INTERVENTION;  Surgeon: Lorretta Harp, MD;  Location: [facility] [facility];  Service: Cardiovascular;  Laterality: N/A;  . CORONARY/GRAFT ACUTE MI REVASCULARIZATION N/A 01/10/2018   Procedure: Coronary/Graft Acute MI Revascularization;  Surgeon: Lorretta Harp, MD;  Location: [place] [facility];  Service: Cardiovascular;  Laterality: N/A;  . EMBOLIZATION Left 03/19/2018  . EMBOLIZATION Left  03/19/2018   Procedure: EMBOLIZATION - Left Internal;  Surgeon: Conrad [place], MD;  Location: [facility];  Service: Cardiovascular;  Laterality: Left;  . EMBOLIZATION Right 04/16/2018   Procedure: EMBOLIZATION;  Surgeon: Conrad Windsor, MD;  Location: [facility];  Service: Cardiovascular;  Laterality: Right;  . FEMORAL ARTERY EXPLORATION Right 05/07/2018   Procedure: FEMORAL ARTERY EXPLORATION, EXTENDED PROFUNDAPLASTY;  Surgeon: Conrad Morenci, MD;  Location: [place];  Service: Vascular;  Laterality: Right;  . INTRAOPERATIVE ARTERIOGRAM Right 05/07/2018   Procedure: INTRA OPERATIVE ARTERIOGRAM;  Surgeon: Conrad Verona, MD;  Location: [place];  Service: Vascular;  Laterality: Right;  . LEFT HEART CATH AND CORONARY ANGIOGRAPHY N/A 01/10/2018   Procedure: LEFT HEART CATH AND CORONARY ANGIOGRAPHY;  Surgeon: Lorretta Harp, MD;  Location: [place] [facility];  Service: Cardiovascular;  Laterality: N/A;  . PACEMAKER INSERTION  11/2005  . PACEMAKER LEAD REMOVAL N/A 10/17/2014   Procedure: PACEMAKER LEAD REMOVAL;  Surgeon: Evans Lance, MD;  Location: [place];  Service: Cardiovascular;  Laterality: N/A;  . RENAL ANGIOGRAPHY Left 04/16/2018   Procedure: RENAL ANGIOGRAPHY;  Surgeon: Conrad [place], MD;  Location: [facility];  Service: Cardiovascular;  Laterality: Left;  . s/p ICD placement      Medtronic Maximo [phone] single chamber    Family History  Problem Relation Age of Onset  . Stroke Father   . Coronary artery disease Maternal Aunt   . Heart failure Maternal Aunt   . Lung cancer Maternal Aunt   . Lung cancer Maternal Uncle   . Cancer Brother        Mouth  . Cancer Sister        throat    Social History   Socioeconomic History  . Marital status: Married    Spouse name: Not on file  . Number of children: 2  . Years of education: Not on file  . Highest education level: Not on file  Occupational History  . Occupation: Grave digger--now disabled  Social Needs  .  Financial resource strain: Not on file  . Food insecurity    Worry: Not on file    Inability: Not on file  . Transportation needs    Medical: Not on file    Non-medical: Not on file  Tobacco Use  . Smoking status: Former Smoker    Packs/day: 2.00    Years: 15.00    Pack years: 30.00    Types: Cigarettes    Quit date: 01/09/2018    Years since quitting: 1.4  . Smokeless tobacco: Never Used  . Tobacco comment: GAVE 1-800-QUIT-NOW  Substance and Sexual Activity  . Alcohol use: No  . Drug use: No  . Sexual activity: Never  Lifestyle  . Physical activity    Days per week: Not on file  Minutes per session: Not on file  . Stress: Not on file  Relationships  . Social Herbalist on phone: Not on file    Gets together: Not on file    Attends religious service: Not on file    Active member of club or organization: Not on file    Attends meetings of clubs or organizations: Not on file    Relationship status: Not on file  . Intimate partner violence    Fear of current or ex partner: Not on file    Emotionally abused: Not on file    Physically abused: Not on file    Forced sexual activity: Not on file  Other Topics Concern  . Not on file  Social History Narrative   No living will   Requests wife as health care POA   Would accept resuscitation but doesn't want prolonged ventilation   No tube feeds if cognitively unaware   Review of Systems No fever Ankles, shoulders hurt. Has had injections in the past    Objective:   Physical Exam  Constitutional: He appears well-developed. No distress.  Musculoskeletal:     Comments: Right knee unremarkable Left knee appears to have some effusion No ligament instability Pain with medial meniscus stress  Neurological:  Limps on the left leg No clear weakness---pain           Assessment & Plan:

## 2019-07-01 ENCOUNTER — Other Ambulatory Visit: Payer: Self-pay

## 2019-07-01 MED ORDER — BISOPROLOL FUMARATE 10 MG PO TABS: 10.0000 mg | ORAL_TABLET | Freq: Every day | ORAL | 2 refills | Status: DC

## 2019-08-04 ENCOUNTER — Ambulatory Visit: Payer: BC Managed Care – PPO | Admitting: Cardiology

## 2019-08-22 NOTE — Progress Notes (Signed)
Cardiology Office Note   Date:  08/23/2019   ID:  [name], [name] 11/03/1956, MRN [id number]  PCP:  Venia Carbon, MD  Cardiologist:   Minus Breeding, MD   Chief Complaint  Patient presents with  . Chest Pain      History of Present Illness: [name] is a 63 y.o. male who presents for  follow up of CAD.   In 2012 he had a stress test indicating anterior ischemia mild but from base to apex.  I sent him for a cath demonstrating LAD a 60-70% stenosis, moderate size second diagonal with 50% stenosis, circumflex AV groove 75-80% stenosis with a branching obtuse marginal with a superior branch subtotal stenosis followed by diffuse disease, right coronary artery had nonobstructive disease.  EF was 35%.   He was managed medically.  He has had an ICD.  However, when he had ERI Dr. Lovena Le elected not to replace the device. He had a recall lead and it was removed.  In Jan 2019 he had an inferior MI .    He underwent urgent DES to to the mid RCA and occluded left circumflex.    EF on echo was 40 - 45%.  He had some residual disease in the LAD and the plan was for out patient stress testing with PCI as needed.   On follow up in Sept he had increased dyspnea but no new ischemia on follow up [place]..  The EF was thought to be multifactorial.  He was managed clinically.   He had AAA repair.    Since I last saw him he continues to have mostly problems with he got some injections that did not help.  He has knee problems.  He was supposed to get hip injections as well but then the pandemic struck.  He is not been doing very much.  He is limited by his back and knees.  He does get chest discomfort.  Since January he is taken about 16 nitroglycerin.  He cannot really say that this is an accelerated pattern.  He thinks it also happens with food and he describes an upper chest discomfort.  It goes away quickly with nitroglycerin after a few minutes.  He is not bringing this on with activity  although he is very limited.  He has chronic shortness of breath that sounds above baseline.  Is not describing new PND or orthopnea.  He is not describing new palpitations, presyncope or syncope.  He has put on a few pounds.  He is not having any new swelling.   Past Medical History:  Diagnosis Date  . Arthritis   . Automatic implantable cardioverter-defibrillator in situ   . Benign essential HTN 09/17/2016  . COPD (chronic obstructive pulmonary disease) ([place])    emphysema by CXR  . Coronary artery disease     LAD a 60-70% stenosis, moderate size second diagonal with 50% stenosis, circumflex AV groove 75-80% stenosis with a branching obtuse marginal with a superior branch subtotal stenosis followed by diffuse disease, right coronary artery  had nonobstructive disease.  EF was 35%  . Diabetes mellitus    type 2  no meds  . Diverticulosis of colon   . GERD (gastroesophageal reflux disease)    with esophagitis  . History of colonic polyps   . Hypertension   . Non-ischemic cardiomyopathy ([place])   . Presence of permanent cardiac pacemaker   . PVD (peripheral vascular disease) ([place])    bilateral common  iliac artery aneurysms. Right SFA occlusion over a long segment. Left SFA disease with occlusion of the left TP trunk. Artirogram Oct. 2006  . Shortness of breath    exertion  . Sleep apnea   . Tobacco abuse   . Urinary incontinence    detrussor instability    Past Surgical History:  Procedure Laterality Date  . ABDOMINAL AORTIC ENDOVASCULAR STENT GRAFT N/A 05/07/2018   Procedure: ABDOMINAL AORTIC ENDOVASCULAR STENT GRAFT;  Surgeon: Conrad [place], MD;  Location: [facility];  Service: Vascular;  Laterality: N/A;  . CARDIAC CATHETERIZATION  06/2004   negative  . COLONOSCOPY  04/2005  . COPD exacerbation    . CORONARY ANGIOPLASTY  01/09/2018  . CORONARY STENT INTERVENTION N/A 01/10/2018   Procedure: CORONARY STENT INTERVENTION;  Surgeon: Lorretta Harp, MD;  Location: [place] [facility];   Service: Cardiovascular;  Laterality: N/A;  . CORONARY/GRAFT ACUTE MI REVASCULARIZATION N/A 01/10/2018   Procedure: Coronary/Graft Acute MI Revascularization;  Surgeon: Lorretta Harp, MD;  Location: [facility];  Service: Cardiovascular;  Laterality: N/A;  . EMBOLIZATION Left 03/19/2018  . EMBOLIZATION Left 03/19/2018   Procedure: EMBOLIZATION - Left Internal;  Surgeon: Conrad [place], MD;  Location: [facility];  Service: Cardiovascular;  Laterality: Left;  . EMBOLIZATION Right 04/16/2018   Procedure: EMBOLIZATION;  Surgeon: Conrad Mehama, MD;  Location: [place] [facility];  Service: Cardiovascular;  Laterality: Right;  . FEMORAL ARTERY EXPLORATION Right 05/07/2018   Procedure: FEMORAL ARTERY EXPLORATION, EXTENDED PROFUNDAPLASTY;  Surgeon: Conrad Jerauld, MD;  Location: [facility];  Service: Vascular;  Laterality: Right;  . INTRAOPERATIVE ARTERIOGRAM Right 05/07/2018   Procedure: INTRA OPERATIVE ARTERIOGRAM;  Surgeon: Conrad , MD;  Location: [facility];  Service: Vascular;  Laterality: Right;  . LEFT HEART CATH AND CORONARY ANGIOGRAPHY N/A 01/10/2018   Procedure: LEFT HEART CATH AND CORONARY ANGIOGRAPHY;  Surgeon: Lorretta Harp, MD;  Location: [facility];  Service: Cardiovascular;  Laterality: N/A;  . PACEMAKER INSERTION  11/2005  . PACEMAKER LEAD REMOVAL N/A 10/17/2014   Procedure: PACEMAKER LEAD REMOVAL;  Surgeon: Evans Lance, MD;  Location: [facility];  Service: Cardiovascular;  Laterality: N/A;  . RENAL ANGIOGRAPHY Left 04/16/2018   Procedure: RENAL ANGIOGRAPHY;  Surgeon: Conrad , MD;  Location: [place] [facility];  Service: Cardiovascular;  Laterality: Left;  . s/p ICD placement      Medtronic Maximo [phone] single chamber     Current Outpatient Medications  Medication Sig Dispense Refill  . atorvastatin (LIPITOR) 80 MG tablet TAKE 1 TABLET BY MOUTH  DAILY AT 6 PM. 90 tablet 3  . bisoprolol (ZEBETA) 10 MG tablet Take 1 tablet (10 mg total) by mouth daily. 90 tablet  2  . clopidogrel (PLAVIX) 75 MG tablet TAKE 1 TABLET BY MOUTH EVERY DAY 30 tablet 2  . glipiZIDE (GLUCOTROL) 5 MG tablet Take 1 tablet (5 mg total) by mouth 2 (two) times daily before a meal. 180 tablet 3  . losartan (COZAAR) 25 MG tablet TAKE 1 TABLET BY MOUTH  DAILY 90 tablet 3  . metFORMIN (GLUCOPHAGE-XR) 500 MG 24 hr tablet TAKE 2 TABLETS BY MOUTH EVERY DAY WITH BREAKFAST 60 tablet 5  . multivitamin (THERAGRAN) per tablet Take 1 tablet by mouth daily.     . nitroGLYCERIN (NITROSTAT) 0.4 MG SL tablet Place 1 tablet (0.4 mg total) under the tongue every 5 (five) minutes as needed. (Patient taking differently: Place 0.4 mg under the  tongue every 5 (five) minutes as needed for chest pain. ) 25 tablet 2  . spironolactone (ALDACTONE) 25 MG tablet TAKE ONE-HALF TABLET BY  MOUTH DAILY 45 tablet 3  . tiotropium (SPIRIVA) 18 MCG inhalation capsule Place 1 capsule (18 mcg total) into inhaler and inhale daily. 30 capsule 11  . VENTOLIN HFA 108 (90 Base) MCG/ACT inhaler TAKE 2 PUFFS BY MOUTH EVERY 6 HOURS AS NEEDED FOR WHEEZE OR SHORTNESS OF BREATH *USE W/ SPACER* 18 Inhaler 1  . vitamin B-12 (CYANOCOBALAMIN) 1000 MCG tablet Take 1,000 mcg by mouth daily.     . isosorbide mononitrate (IMDUR) 60 MG 24 hr tablet Take 1 tablet (60 mg total) by mouth daily. 90 tablet 3   No current facility-administered medications for this visit.     Allergies:   Patient has no known allergies.    ROS:  Please see the history of present illness.   Otherwise, review of systems are positive for none.   All other systems are reviewed and negative.    PHYSICAL EXAM: VS:  Pulse 69   Temp (!) 97 F (36.1 C) (Temporal)   Ht 5\' 9"  (1.753 m)   Wt 286 lb 6.4 oz (129.9 kg)   SpO2 96%   BMI 42.29 kg/m  , BMI Body mass index is 42.29 kg/m. GENERAL:  Well appearing NECK:  No jugular venous distention, waveform within normal limits, carotid upstroke brisk and symmetric, no bruits, no thyromegaly LUNGS:  Clear to auscultation  bilaterally CHEST:  Unremarkable HEART:  PMI not displaced or sustained,S1 and S2 within normal limits, no S3, no S4, no clicks, no rubs, no murmurs ABD:  Flat, positive bowel sounds normal in frequency in pitch, no bruits, no rebound, no guarding, no midline pulsatile mass, no hepatomegaly, no splenomegaly EXT:  2 plus pulses throughout, no edema, no cyanosis no clubbing   EKG:  EKG is  ordered today. Sinus bradycardia, rate 59, axis WNL, intervals WNL, non specific lateral ST changes.    Recent Labs: 08/25/2018: ALT 22; TSH 2.460 12/23/2018: BUN 9; Creatinine, Ser 1.03; Hemoglobin 15.3; Platelets 298; Potassium 4.5; Sodium 138    Lipid Panel    Component Value Date/Time   CHOL 156 08/25/2018 1154   TRIG 400 (H) 08/25/2018 1154   TRIG 575 (HH) 01/02/2007 0000   HDL 41 08/25/2018 1154   CHOLHDL 3.8 08/25/2018 1154   CHOLHDL 4 01/28/2018 1624   VLDL 44.6 (H) 01/28/2018 1624   LDLCALC 35 08/25/2018 1154   LDLDIRECT 35.0 01/28/2018 1624      Wt Readings from Last 3 Encounters:  08/23/19 286 lb 6.4 oz (129.9 kg)  06/29/19 285 lb (129.3 kg)  06/08/19 287 lb (130.2 kg)      Other studies Reviewed: Additional studies/ records that were reviewed today include: Labs Review of the above records demonstrates:  Please see elsewhere in the note.     ASSESSMENT AND PLAN:  CAD:    He does have some chest discomfort.  Is very difficult to sort out from angina versus something else.  I am to start by adding Imdur 60 mg daily and then he might need up titration of this.  However, not be good at this point that I would put him through an ischemia work-up with the vague symptoms.   DYSPNEA:   His EF is mildly reduced and actually was better last year on follow-up and had been previously.  He has COPD, obesity, deconditioning.  I do not suspect that he  is volume overloaded as a significant etiology.  No further work-up.   HTN:    Blood pressure is at target as above.  No change in therapy. c  lifestyle changes (TLC).   PVD:   He had repair of his aorta with EAVR.  Continue with risk reduction.   DYSLIPIDEMIA:   LDL was 35, HDL 31.  He will remain on meds as listed.  CARDIOMYOPATHY:  EF was 40 - 45%.  As above this is stable.    The following changes have been made:  As above  Labs/ tests ordered today include:   None  No orders of the defined types were placed in this encounter.    Disposition:   FU Jory Sims DNP in one month.    Signed, Minus Breeding, MD  08/23/2019 5:28 PM    [facility]

## 2019-08-23 ENCOUNTER — Other Ambulatory Visit: Payer: Self-pay

## 2019-08-23 ENCOUNTER — Ambulatory Visit (INDEPENDENT_AMBULATORY_CARE_PROVIDER_SITE_OTHER): Payer: BC Managed Care – PPO | Admitting: Cardiology

## 2019-08-23 ENCOUNTER — Encounter: Payer: Self-pay | Admitting: Cardiology

## 2019-08-23 VITALS — BP 118/80 | HR 69 | Temp 97.0°F | Ht 69.0 in | Wt 286.4 lb

## 2019-08-23 DIAGNOSIS — R079 Chest pain, unspecified: Secondary | ICD-10-CM

## 2019-08-23 DIAGNOSIS — I493 Ventricular premature depolarization: Secondary | ICD-10-CM | POA: Diagnosis not present

## 2019-08-23 DIAGNOSIS — I10 Essential (primary) hypertension: Secondary | ICD-10-CM

## 2019-08-23 DIAGNOSIS — E785 Hyperlipidemia, unspecified: Secondary | ICD-10-CM | POA: Diagnosis not present

## 2019-08-23 DIAGNOSIS — I2510 Atherosclerotic heart disease of native coronary artery without angina pectoris: Secondary | ICD-10-CM

## 2019-08-23 DIAGNOSIS — I739 Peripheral vascular disease, unspecified: Secondary | ICD-10-CM | POA: Diagnosis not present

## 2019-08-23 DIAGNOSIS — I25119 Atherosclerotic heart disease of native coronary artery with unspecified angina pectoris: Secondary | ICD-10-CM

## 2019-08-23 MED ORDER — ISOSORBIDE MONONITRATE ER 60 MG PO TB24: 60.0000 mg | ORAL_TABLET | Freq: Every day | ORAL | 3 refills | Status: DC

## 2019-08-23 NOTE — Patient Instructions (Signed)
Medication Instructions:  START ISOSORBIDE 60 MG DAILY   If you need a refill on your cardiac medications before your next appointment, please call your pharmacy.   Lab work: NONE  Testing/Procedures: NONE   Follow-Up:  Your physician recommends that you schedule a follow-up appointment in: [place] OFFICE [place]

## 2019-08-24 ENCOUNTER — Ambulatory Visit: Payer: BC Managed Care – PPO | Admitting: Cardiology

## 2019-08-29 ENCOUNTER — Other Ambulatory Visit: Payer: Self-pay | Admitting: Vascular Surgery

## 2019-09-03 ENCOUNTER — Other Ambulatory Visit (INDEPENDENT_AMBULATORY_CARE_PROVIDER_SITE_OTHER): Payer: BC Managed Care – PPO

## 2019-09-03 DIAGNOSIS — R079 Chest pain, unspecified: Secondary | ICD-10-CM | POA: Diagnosis not present

## 2019-09-08 ENCOUNTER — Other Ambulatory Visit (INDEPENDENT_AMBULATORY_CARE_PROVIDER_SITE_OTHER): Payer: BC Managed Care – PPO

## 2019-09-08 ENCOUNTER — Other Ambulatory Visit: Payer: Self-pay

## 2019-09-08 DIAGNOSIS — E1151 Type 2 diabetes mellitus with diabetic peripheral angiopathy without gangrene: Secondary | ICD-10-CM

## 2019-09-08 LAB — POCT GLYCOSYLATED HEMOGLOBIN (HGB A1C): Hemoglobin A1C: 7.3 % — AB (ref 4.0–5.6)

## 2019-09-22 ENCOUNTER — Ambulatory Visit (INDEPENDENT_AMBULATORY_CARE_PROVIDER_SITE_OTHER): Payer: BC Managed Care – PPO | Admitting: Adult Health

## 2019-09-22 ENCOUNTER — Other Ambulatory Visit: Payer: Self-pay

## 2019-09-22 ENCOUNTER — Encounter: Payer: Self-pay | Admitting: Adult Health

## 2019-09-22 VITALS — BP 130/70 | HR 63 | Ht 69.0 in | Wt 286.6 lb

## 2019-09-22 DIAGNOSIS — I714 Abdominal aortic aneurysm, without rupture, unspecified: Secondary | ICD-10-CM

## 2019-09-22 DIAGNOSIS — I10 Essential (primary) hypertension: Secondary | ICD-10-CM | POA: Diagnosis not present

## 2019-09-22 DIAGNOSIS — I723 Aneurysm of iliac artery: Secondary | ICD-10-CM

## 2019-09-22 DIAGNOSIS — M545 Low back pain, unspecified: Secondary | ICD-10-CM

## 2019-09-22 DIAGNOSIS — I25119 Atherosclerotic heart disease of native coronary artery with unspecified angina pectoris: Secondary | ICD-10-CM

## 2019-09-22 DIAGNOSIS — I2510 Atherosclerotic heart disease of native coronary artery without angina pectoris: Secondary | ICD-10-CM | POA: Diagnosis not present

## 2019-09-22 DIAGNOSIS — G8929 Other chronic pain: Secondary | ICD-10-CM | POA: Diagnosis not present

## 2019-09-22 DIAGNOSIS — I739 Peripheral vascular disease, unspecified: Secondary | ICD-10-CM

## 2019-09-22 NOTE — Patient Instructions (Signed)
Medication Instructions:  Continue current medications  If you need a refill on your cardiac medications before your next appointment, please call your pharmacy.  Labwork: None Ordered   Testing/Procedures: None Ordered  Follow-Up: You will need a follow up appointment in 6 months.  Please call our office 2 months in advance to schedule this appointment.  You may see [name] Hochrein, MD or one of the following Advanced Practice Providers on your designated Care Team:   Rhonda Barrett, PA-C . Kathryn Lawrence, DNP, ANP    At [facility], you and your health needs are our priority.  As part of our continuing mission to provide you with exceptional heart care, we have created designated Provider Care Teams.  These Care Teams include your primary Cardiologist (physician) and Advanced Practice Providers (APPs -  Physician Assistants and Nurse Practitioners) who all work together to provide you with the care you need, when you need it.  Thank you for choosing [facility] at [facility]!!     

## 2019-09-22 NOTE — Progress Notes (Signed)
Cardiology Office Note   Date:  09/22/2019   ID:  Lan, Hodgson Nov 12, 1956, MRN [id number]  PCP:  Venia Carbon, MD  Cardiologist: Dr. Percival Spanish  CC: Follow Up   History of Present Illness: [name] is a 63 y.o. male who presents for ongoing assessment and management of CAD, with cardiac cath in 2012 demonstrating LAD a 60-70% stenosis, moderate size second diagonal with 50% stenosis, circumflex AV groove 75-80% stenosis with a branching obtuse marginal with a superior branch subtotal stenosis followed by diffuse disease, right coronary artery had nonobstructive disease.  EF was 35%. He had a ICD placed, and at [facility], Dr.Taylor elected not to replace it.    In Jan 2019 he had an inferior MI .  He underwent urgent DES to to the mid RCA and occluded left circumflex. EF on echo was 40 - 45%.  He had some residual disease in the LAD and the plan was for out patient stress testing with PCI  If abnormal. . He had a AAA repair 04/2018.   On last visit with Dr. Percival Spanish on 08/23/2019 he was started on isosorbide 60 mg daily for complaints of chest pain that was not clearly defined as cardiac/iscemic pain. He was not planned for stress test or repeat cath.   He comes today in a motorized wheelchair.  He states that he is unable to bear weight on his legs due to pain and weakness.  He has been followed by Dr. Bridgett Larsson and by Dr. Doren Custard was most recent visit in May 2019.  The patient had abdominal aortic endovascular stent placed was femoral artery arteriogram on 05/07/2018.  Since that time the patient has not had follow-up appointments with vascular and continues to have worsening discomfort in his legs.  He denies any recurrent chest pain or shortness of breath.  He is very inactive as he is unable to stand for very long due to pain in his legs and lower back.  The patient has been medically compliant.  He denies any bleeding or bruising after taking Plavix.  Blood pressure has been well controlled at  home.  Labs been recently completed by his primary care physician Dr. Silvio Pate.   Past Medical History:  Diagnosis Date  . Arthritis   . Automatic implantable cardioverter-defibrillator in situ   . Benign essential HTN 09/17/2016  . COPD (chronic obstructive pulmonary disease) ([place])    emphysema by CXR  . Coronary artery disease     LAD a 60-70% stenosis, moderate size second diagonal with 50% stenosis, circumflex AV groove 75-80% stenosis with a branching obtuse marginal with a superior branch subtotal stenosis followed by diffuse disease, right coronary artery  had nonobstructive disease.  EF was 35%  . Diabetes mellitus    type 2  no meds  . Diverticulosis of colon   . GERD (gastroesophageal reflux disease)    with esophagitis  . History of colonic polyps   . Hypertension   . Non-ischemic cardiomyopathy ([place])   . Presence of permanent cardiac pacemaker   . PVD (peripheral vascular disease) ([place])    bilateral common iliac artery aneurysms. Right SFA occlusion over a long segment. Left SFA disease with occlusion of the left TP trunk. Artirogram Oct. 2006  . Shortness of breath    exertion  . Sleep apnea   . Tobacco abuse   . Urinary incontinence    detrussor instability    Past Surgical History:  Procedure Laterality Date  . ABDOMINAL AORTIC  ENDOVASCULAR STENT GRAFT N/A 05/07/2018   Procedure: ABDOMINAL AORTIC ENDOVASCULAR STENT GRAFT;  Surgeon: Conrad Fate, MD;  Location: [facility];  Service: Vascular;  Laterality: N/A;  . CARDIAC CATHETERIZATION  06/2004   negative  . COLONOSCOPY  04/2005  . COPD exacerbation    . CORONARY ANGIOPLASTY  01/09/2018  . CORONARY STENT INTERVENTION N/A 01/10/2018   Procedure: CORONARY STENT INTERVENTION;  Surgeon: Lorretta Harp, MD;  Location: [facility] [facility];  Service: Cardiovascular;  Laterality: N/A;  . CORONARY/GRAFT ACUTE MI REVASCULARIZATION N/A 01/10/2018   Procedure: Coronary/Graft Acute MI Revascularization;  Surgeon: Lorretta Harp, MD;  Location: [place] [facility];  Service: Cardiovascular;  Laterality: N/A;  . EMBOLIZATION Left 03/19/2018  . EMBOLIZATION Left 03/19/2018   Procedure: EMBOLIZATION - Left Internal;  Surgeon: Conrad Dorado, MD;  Location: [facility] [facility];  Service: Cardiovascular;  Laterality: Left;  . EMBOLIZATION Right 04/16/2018   Procedure: EMBOLIZATION;  Surgeon: Conrad Groveland, MD;  Location: [facility];  Service: Cardiovascular;  Laterality: Right;  . FEMORAL ARTERY EXPLORATION Right 05/07/2018   Procedure: FEMORAL ARTERY EXPLORATION, EXTENDED PROFUNDAPLASTY;  Surgeon: Conrad Oakesdale, MD;  Location: [place] [facility];  Service: Vascular;  Laterality: Right;  . INTRAOPERATIVE ARTERIOGRAM Right 05/07/2018   Procedure: INTRA OPERATIVE ARTERIOGRAM;  Surgeon: Conrad Carteret, MD;  Location: [place];  Service: Vascular;  Laterality: Right;  . LEFT HEART CATH AND CORONARY ANGIOGRAPHY N/A 01/10/2018   Procedure: LEFT HEART CATH AND CORONARY ANGIOGRAPHY;  Surgeon: Lorretta Harp, MD;  Location: [place] [facility];  Service: Cardiovascular;  Laterality: N/A;  . PACEMAKER INSERTION  11/2005  . PACEMAKER LEAD REMOVAL N/A 10/17/2014   Procedure: PACEMAKER LEAD REMOVAL;  Surgeon: Evans Lance, MD;  Location: [facility];  Service: Cardiovascular;  Laterality: N/A;  . RENAL ANGIOGRAPHY Left 04/16/2018   Procedure: RENAL ANGIOGRAPHY;  Surgeon: Conrad [place], MD;  Location: [facility] [facility];  Service: Cardiovascular;  Laterality: Left;  . s/p ICD placement      Medtronic Maximo [phone] single chamber     Current Outpatient Medications  Medication Sig Dispense Refill  . atorvastatin (LIPITOR) 80 MG tablet TAKE 1 TABLET BY MOUTH  DAILY AT 6 PM. 90 tablet 3  . bisoprolol (ZEBETA) 10 MG tablet Take 1 tablet (10 mg total) by mouth daily. 90 tablet 2  . clopidogrel (PLAVIX) 75 MG tablet TAKE 1 TABLET BY MOUTH EVERY DAY 90 tablet 3  . glipiZIDE (GLUCOTROL) 5 MG tablet Take 1 tablet (5 mg total) by mouth 2 (two) times daily before  a meal. 180 tablet 3  . isosorbide mononitrate (IMDUR) 60 MG 24 hr tablet Take 1 tablet (60 mg total) by mouth daily. 90 tablet 3  . losartan (COZAAR) 25 MG tablet TAKE 1 TABLET BY MOUTH  DAILY 90 tablet 3  . metFORMIN (GLUCOPHAGE-XR) 500 MG 24 hr tablet TAKE 2 TABLETS BY MOUTH EVERY DAY WITH BREAKFAST 60 tablet 5  . multivitamin (THERAGRAN) per tablet Take 1 tablet by mouth daily.     . nitroGLYCERIN (NITROSTAT) 0.4 MG SL tablet Place 1 tablet (0.4 mg total) under the tongue every 5 (five) minutes as needed. (Patient taking differently: Place 0.4 mg under the tongue every 5 (five) minutes as needed for chest pain. ) 25 tablet 2  . spironolactone (ALDACTONE) 25 MG tablet TAKE ONE-HALF TABLET BY  MOUTH DAILY 45 tablet 3  . tiotropium (SPIRIVA) 18 MCG inhalation capsule Place 1 capsule (18  mcg total) into inhaler and inhale daily. 30 capsule 11  . VENTOLIN HFA 108 (90 Base) MCG/ACT inhaler TAKE 2 PUFFS BY MOUTH EVERY 6 HOURS AS NEEDED FOR WHEEZE OR SHORTNESS OF BREATH *USE W/ SPACER* 18 Inhaler 1  . vitamin B-12 (CYANOCOBALAMIN) 1000 MCG tablet Take 1,000 mcg by mouth daily.      No current facility-administered medications for this visit.     Allergies:   Patient has no known allergies.    Social History:  The patient  reports that he quit smoking about 20 months ago. His smoking use included cigarettes. He has a 30.00 pack-year smoking history. He has never used smokeless tobacco. He reports that he does not drink alcohol or use drugs.   Family History:  The patient's family history includes Cancer in his brother and sister; Coronary artery disease in his maternal aunt; Heart failure in his maternal aunt; Lung cancer in his maternal aunt and maternal uncle; Stroke in his father.    ROS: All other systems are reviewed and negative. Unless otherwise mentioned in H&P    PHYSICAL EXAM: VS:  BP 130/70   Pulse 63   Ht 5\' 9"  (1.753 m)   Wt 286 lb 9.6 oz (130 kg)   SpO2 95%   BMI 42.32 kg/m   , BMI Body mass index is 42.32 kg/m. Obese  GEN: Well nourished, well developed, in no acute distress HEENT: normal Neck: no JVD, carotid bruits, or masses Cardiac:RRR; no murmurs, rubs, or gallops,no edema  Respiratory:  Clear to auscultation bilaterally, normal work of breathing GI: soft, nontender, nondistended, + BS MS: no deformity or atrophy. Pulses are not diminished.  Skin: warm and dry, no rash Neuro:  Strength and sensation are intact Psych: euthymic mood, full affect   EKG:  No completed this office visit.   Recent Labs: 12/23/2018: BUN 9; Creatinine, Ser 1.03; Hemoglobin 15.3; Platelets 298; Potassium 4.5; Sodium 138    Lipid Panel    Component Value Date/Time   CHOL 156 08/25/2018 1154   TRIG 400 (H) 08/25/2018 1154   TRIG 575 (HH) 01/02/2007 0000   HDL 41 08/25/2018 1154   CHOLHDL 3.8 08/25/2018 1154   CHOLHDL 4 01/28/2018 1624   VLDL 44.6 (H) 01/28/2018 1624   LDLCALC 35 08/25/2018 1154   LDLDIRECT 35.0 01/28/2018 1624      Wt Readings from Last 3 Encounters:  09/22/19 286 lb 9.6 oz (130 kg)  08/23/19 286 lb 6.4 oz (129.9 kg)  06/29/19 285 lb (129.3 kg)      Other studies Reviewed: Echocardiogram 09/05/2018 Left ventricle: The cavity size was moderately dilated. There was   moderate concentric hypertrophy. Systolic function was mildly to   moderately reduced. The estimated ejection fraction was in the   range of 40% to 45%. Diffuse hypokinesis. Doppler parameters are   consistent with abnormal left ventricular relaxation (grade 1   diastolic dysfunction). Doppler parameters are consistent with   indeterminate ventricular filling pressure. - Aortic valve: Transvalvular velocity was within the normal range.   There was no stenosis. There was no regurgitation. - Mitral valve: There was trivial regurgitation. - Left atrium: The atrium was severely dilated. - Right ventricle: The cavity size was normal. Wall thickness was   normal. Systolic function was  normal. - Tricuspid valve: There was trivial regurgitation. - Pulmonary arteries: Systolic pressure was within the normal   range. PA peak pressure: 21 mm Hg (S).   Stress Test 08/27/2018 Nuclear stress EF: 28%.  The left ventricular ejection fraction is severely decreased (<30%).  There was no ST segment deviation noted during stress.  Findings consistent with prior myocardial infarction.  This is an intermediate risk study.   1. EF 28%, diffuse hypokinesis.  2. Fixed medium-sized, moderate intensity basal to apical inferior and basal to mid inferolateral perfusion defect.  Suspect infarction without significant ischemia.   ASSESSMENT AND PLAN:  1. CAD: Patient had an inferior MI in January 2019, underwent urgent drug-eluting stent to the mid RCA with occluded left circumflex.  EF was reduced at 40% to 45%.  The patient had AAA repair in May 2019 with iliac artery aneurysm repair in 2019 by vein and vascular, followed by Dr. Bridgett Larsson and Dr. Doren Custard.  The patient has tolerated his current medications well without any recurrent discomfort in his chest.  He is pleased with the isosorbide, as is his completely resolved his pain.  We will not make any changes in his medication regimen at this time.  2.  PAD: Multiple interventions were completed with multiple studies per vein and vascular with AAA repair, right iliac artery repair, with abnormal ABIs noted.  He is being referred back to vein and vascular and Dr. Bridgett Larsson for ongoing management and follow-up concerning further treatment if necessary.  He is to continue statin therapy with Lipitor 80 mg daily.  Labs are completed by Dr. Silvio Pate.  3.  Hypertension: Blood pressures well controlled on losartan, bisoprolol, spironolactone, and isosorbide mononitrate.  We will not make any changes in his regimen at this time.  4.  Chronic back pain: He is followed by Dr. Aretta Nip orthopedic physician.  He has been getting injections in his back and hip for  chronic pain.  He has not been seen since March 62,020 prior to [facility] pandemic restrictions.  He will need to follow-up with that practice for ongoing management of his orthopedic issues.  We have called the office there to have them arrange an appointment for him.  5.  Hypercholesterolemia: Continue on atorvastatin 80 mg daily.  He will need fasting lipids and LFTs if not completed by PCP on follow-up in 6 months.   Current medicines are reviewed at length with the patient today.    Labs/ tests ordered today include: None  Phill Myron. West Pugh, ANP, AACC   09/22/2019 3:50 PM    [facility] [address] Office [phone] Fax [phone]

## 2019-09-23 DIAGNOSIS — M25569 Pain in unspecified knee: Secondary | ICD-10-CM

## 2019-09-29 ENCOUNTER — Ambulatory Visit (INDEPENDENT_AMBULATORY_CARE_PROVIDER_SITE_OTHER): Payer: BC Managed Care – PPO | Admitting: Orthopaedic Surgery

## 2019-09-29 ENCOUNTER — Telehealth: Payer: Self-pay

## 2019-09-29 ENCOUNTER — Encounter: Payer: Self-pay | Admitting: Orthopaedic Surgery

## 2019-09-29 VITALS — Ht 69.0 in | Wt 269.0 lb

## 2019-09-29 DIAGNOSIS — G8929 Other chronic pain: Secondary | ICD-10-CM | POA: Diagnosis not present

## 2019-09-29 DIAGNOSIS — I25119 Atherosclerotic heart disease of native coronary artery with unspecified angina pectoris: Secondary | ICD-10-CM

## 2019-09-29 DIAGNOSIS — M25562 Pain in left knee: Secondary | ICD-10-CM

## 2019-09-29 DIAGNOSIS — M1712 Unilateral primary osteoarthritis, left knee: Secondary | ICD-10-CM

## 2019-09-29 MED ORDER — LIDOCAINE HCL 1 % IJ SOLN
3.0000 mL | INTRAMUSCULAR | Status: AC | PRN
  Administered 2019-09-29: 3 mL

## 2019-09-29 MED ORDER — METHYLPREDNISOLONE ACETATE 40 MG/ML IJ SUSP
40.0000 mg | INTRAMUSCULAR | Status: AC | PRN
  Administered 2019-09-29: 13:00:00 40 mg via INTRA_ARTICULAR

## 2019-09-29 NOTE — Telephone Encounter (Signed)
Left knee gel injection ?

## 2019-09-29 NOTE — Progress Notes (Signed)
Office Visit Note   Patient: [name]           Date of Birth: [birth year]           MRN: [id number] Visit Date: 09/29/2019              Requested by: Venia Carbon, MD [place],  [place] [address] PCP: Venia Carbon, MD   Assessment & Plan: Visit Diagnoses:  1. Chronic pain of left knee   2. Unilateral primary osteoarthritis, left knee     Plan: I counseled him about using his cane in his opposite hand on the right side for now to offload the left knee.  I recommended Voltaren gel to place in the medial aspect of his knee and I did recommend a steroid injection.  He agrees with this treatment plan.  I explained that the steroid ejection can elevate his blood glucose.  He understands that as well and did tolerate the steroid injection well.  He is appropriate candidate to try hyaluronic acid for his left knee and we have ordered this for him.  We will hopefully see him back in 4 weeks to place hyaluronic acid into the left knee.  All question concerns were answered and addressed.  Follow-Up Instructions: Return in about 4 weeks (around 10/27/2019).   Orders:  Orders Placed This Encounter  Procedures   Large Joint Inj   No orders of the defined types were placed in this encounter.     Procedures: Large Joint Inj: L knee on 09/29/2019 1:04 PM Indications: diagnostic evaluation and pain Details: 22 G 1.5 in needle, superolateral approach  Arthrogram: No  Medications: 3 mL lidocaine 1 %; 40 mg methylPREDNISolone acetate 40 MG/ML Outcome: tolerated well, no immediate complications Procedure, treatment alternatives, risks and benefits explained, specific risks discussed. Consent was given by the patient. Immediately prior to procedure a time out was called to verify the correct patient, procedure, equipment, support staff and site/side marked as required. Patient was prepped and draped in the usual sterile fashion.       Clinical Data: No  additional findings.   Subjective: Chief Complaint  Patient presents with   Left Knee - Pain  The patient is a very pleasant 63 year old gentleman sent from Dr. Silvio Pate to evaluate and treat left knee pain.  Is been getting worse for a couple months now.  He has x-rays back in July for me to review of the left knee and they were read as negative.  Both his knees have been hurting for some time but the left is 1 hurts and really the worst.  Is a constant pain ache with activities.  He says he cannot go long distances.  He is using a cane in that hand.  He does report right knee pain the left knee is was bothering him the most and is what he has x-rays of.  He also has a complex medical history of peripheral vascular disease, heart failure in the past and diabetes.  He also has COPD.  He is very deconditioned and overweight and does have difficulty mobilizing in general.  He has never had a steroid injection in his left knee.  He had one his left shoulder before.  HPI  Review of Systems He currently denies any chest pain or shortness of breath.  He denies any fever, chills, nausea, vomiting.  He says he can ambulate only short distances.  Objective: Vital Signs: Ht 5\' 9"  (  1.753 m)    Wt 269 lb (122 kg)    BMI 39.72 kg/m   Physical Exam He is alert and orient x3 and in no acute distress.  He has difficulty getting up on the exam table and mobilizing in general. Ortho Exam Examination of both knees shows no effusion.  He has significant medial joint line tenderness and tenderness over the medial epicondyle area of the left knee.  Both knees have good range of motion and are ligamentously stable. Specialty Comments:  No specialty comments available.  Imaging: No results found. X-rays independently reviewed of the left knee showed only mild arthritic changes with slight medial joint space narrowing and some patellofemoral arthritis but overall no acute findings with no effusion and good  alignment.  PMFS History: Patient Active Problem List   Diagnosis Date Noted   Knee pain, left 06/29/2019   Cervical sprain 01/28/2019   Subacromial impingement of left shoulder 01/19/2019   Type 2 diabetes mellitus with diabetic neuropathy ([place]) 01/19/2019   PVD (peripheral vascular disease) ([place]) 10/13/2018   PVC's (premature ventricular contractions) 10/13/2018   Iliac artery occlusion ([place]) 03/19/2018   Aneurysm of iliac artery ([place]) 03/06/2018   Status post coronary artery stent placement    AAA (abdominal aortic aneurysm) without rupture ([facility])    Bilateral carotid artery stenosis 04/28/2017   Low back pain 04/14/2017   Cerumen impaction 11/25/2016   Benign essential HTN 09/17/2016   CAD (coronary artery disease), native coronary artery 09/17/2016   Mood disorder ([place]) 11/29/2015   Left leg swelling 05/15/2015   COPD exacerbation ([place]) 12/15/2012   Routine general medical examination at a health care facility 10/26/2012   Atherosclerosis of native coronary artery with angina pectoris ([place]) 10/17/2011   OSA (obstructive sleep apnea) 08/28/2011   Obesity 99991111   Chronic systolic heart failure ([place]) 08/06/2011   B12 DEFICIENCY 04/14/2009   CAROTID BRUIT 02/16/2009   Diabetic polyneuropathy ([place]) 12/07/2008   URINARY INCONTINENCE 05/27/2008   COPD (chronic obstructive pulmonary disease) with emphysema ([place]) 02/09/2008   Hyperlipemia 10/13/2007   Peripheral vascular disease in diabetes mellitus ([place]) 09/15/2007   DIVERTICULOSIS, COLON 09/15/2007   COLONIC POLYPS, HX OF 09/15/2007   Type 2 diabetes, controlled, with peripheral circulatory disorder ([place]) 05/25/2007   GERD 05/25/2007   REFLUX ESOPHAGITIS 04/23/2007   Past Medical History:  Diagnosis Date   Arthritis    Automatic implantable cardioverter-defibrillator in situ    Benign essential HTN 09/17/2016   COPD (chronic obstructive pulmonary disease) ([place])    emphysema by CXR    Coronary artery disease     LAD a 60-70% stenosis, moderate size second diagonal with 50% stenosis, circumflex AV groove 75-80% stenosis with a branching obtuse marginal with a superior branch subtotal stenosis followed by diffuse disease, right coronary artery  had nonobstructive disease.  EF was 35%   Diabetes mellitus    type 2  no meds   Diverticulosis of colon    GERD (gastroesophageal reflux disease)    with esophagitis   History of colonic polyps    Hypertension    Non-ischemic cardiomyopathy ([facility])    Presence of permanent cardiac pacemaker    PVD (peripheral vascular disease) ([facility])    bilateral common iliac artery aneurysms. Right SFA occlusion over a long segment. Left SFA disease with occlusion of the left TP trunk. Artirogram Oct. 2006   Shortness of breath    exertion   Sleep apnea    Tobacco abuse    Urinary incontinence  detrussor instability    Family History  Problem Relation Age of Onset   Stroke Father    Coronary artery disease Maternal Aunt    Heart failure Maternal Aunt    Lung cancer Maternal Aunt    Lung cancer Maternal Uncle    Cancer Brother        Mouth   Cancer Sister        throat    Past Surgical History:  Procedure Laterality Date   ABDOMINAL AORTIC ENDOVASCULAR STENT GRAFT N/A 05/07/2018   Procedure: ABDOMINAL AORTIC ENDOVASCULAR STENT GRAFT;  Surgeon: Conrad Kirby, MD;  [facility];  Service: Vascular;  Laterality: N/A;   CARDIAC CATHETERIZATION  06/2004   negative   COLONOSCOPY  04/2005   COPD exacerbation     CORONARY ANGIOPLASTY  01/09/2018   CORONARY STENT INTERVENTION N/A 01/10/2018   Procedure: CORONARY STENT INTERVENTION;  Surgeon: Lorretta Harp, MD;  Location: [place] [facility];  Service: Cardiovascular;  Laterality: N/A;   CORONARY/GRAFT ACUTE MI REVASCULARIZATION N/A 01/10/2018   Procedure: Coronary/Graft Acute MI Revascularization;  Surgeon: Lorretta Harp, MD;  Location: [place] [facility];  Service: Cardiovascular;  Laterality: N/A;   EMBOLIZATION Left 03/19/2018   EMBOLIZATION Left 03/19/2018   Procedure: EMBOLIZATION - Left Internal;  Surgeon: Conrad [place], MD;  Location: [place] [facility];  Service: Cardiovascular;  Laterality: Left;   EMBOLIZATION Right 04/16/2018   Procedure: EMBOLIZATION;  Surgeon: Conrad [place]ville, MD;  Location: [place] [facility];  Service: Cardiovascular;  Laterality: Right;   FEMORAL ARTERY EXPLORATION Right 05/07/2018   Procedure: FEMORAL ARTERY EXPLORATION, EXTENDED PROFUNDAPLASTY;  Surgeon: Conrad Taft [place], MD;  Location: [place];  Service: Vascular;  Laterality: Right;   INTRAOPERATIVE ARTERIOGRAM Right 05/07/2018   Procedure: INTRA OPERATIVE ARTERIOGRAM;  Surgeon: Conrad Muir, MD;  Location: [place];  Service: Vascular;  Laterality: Right;   LEFT HEART CATH AND CORONARY ANGIOGRAPHY N/A 01/10/2018   Procedure: LEFT HEART CATH AND CORONARY ANGIOGRAPHY;  Surgeon: Lorretta Harp, MD;  Location: [place] [facility];  Service: Cardiovascular;  Laterality: N/A;   PACEMAKER INSERTION  11/2005   PACEMAKER LEAD REMOVAL N/A 10/17/2014   Procedure: PACEMAKER LEAD REMOVAL;  Surgeon: Evans Lance, MD;  Location: [place];  Service: Cardiovascular;  Laterality: N/A;   RENAL ANGIOGRAPHY Left 04/16/2018   Procedure: RENAL ANGIOGRAPHY;  Surgeon: Conrad [place], MD;  Location: [place] [facility];  Service: Cardiovascular;  Laterality: Left;   s/p ICD placement      Medtronic Maximo [phone] single chamber   Social History   Occupational History   Occupation: Grave digger--now disabled  Tobacco Use   Smoking status: Former Smoker    Packs/day: 2.00    Years: 15.00    Pack years: 30.00    Types: Cigarettes    Quit date: 01/09/2018    Years since quitting: 1.7   Smokeless tobacco: Never Used   Tobacco comment: GAVE 1-800-QUIT-NOW  Substance and Sexual Activity   Alcohol use: No   Drug use: No   Sexual activity: Never

## 2019-09-29 NOTE — Telephone Encounter (Signed)
Noted  

## 2019-09-30 ENCOUNTER — Telehealth: Payer: Self-pay

## 2019-09-30 NOTE — Telephone Encounter (Signed)
Submitted VOB for SynviscOne, left knee. 

## 2019-10-05 ENCOUNTER — Other Ambulatory Visit: Payer: Self-pay | Admitting: Internal Medicine

## 2019-10-24 ENCOUNTER — Other Ambulatory Visit: Payer: Self-pay | Admitting: Cardiology

## 2019-10-25 ENCOUNTER — Other Ambulatory Visit: Payer: Self-pay

## 2019-10-27 ENCOUNTER — Encounter: Payer: Self-pay | Admitting: Radiology

## 2019-10-27 NOTE — Progress Notes (Signed)
Patient's primary insurance will cover 80% of all costs meds/admin.  Secondary insurance will cover 70% of remaining 20%.  He should have minimal OOP costs, amount unable to determine.  No PA, buy and bill ok.

## 2019-10-28 ENCOUNTER — Encounter: Payer: Self-pay | Admitting: Orthopaedic Surgery

## 2019-10-28 ENCOUNTER — Ambulatory Visit (INDEPENDENT_AMBULATORY_CARE_PROVIDER_SITE_OTHER): Payer: BC Managed Care – PPO | Admitting: Orthopaedic Surgery

## 2019-10-28 DIAGNOSIS — M1712 Unilateral primary osteoarthritis, left knee: Secondary | ICD-10-CM

## 2019-10-28 MED ORDER — HYLAN G-F 20 48 MG/6ML IX SOSY
48.0000 mg | PREFILLED_SYRINGE | INTRA_ARTICULAR | Status: AC | PRN
  Administered 2019-10-28: 48 mg via INTRA_ARTICULAR

## 2019-10-28 NOTE — Progress Notes (Signed)
   Procedure Note  Patient: [name]             Date of Birth: 1956-08-23           MRN: [id number]             Visit Date: 10/28/2019  Procedures: Visit Diagnoses:  1. Unilateral primary osteoarthritis, left knee     Large Joint Inj: L knee on 10/28/2019 1:36 PM Indications: pain and diagnostic evaluation Details: 22 G 1.5 in needle, superolateral approach  Arthrogram: No  Medications: 48 mg Hylan 48 MG/6ML Outcome: tolerated well, no immediate complications Procedure, treatment alternatives, risks and benefits explained, specific risks discussed. Consent was given by the patient. Immediately prior to procedure a time out was called to verify the correct patient, procedure, equipment, support staff and site/side marked as required. Patient was prepped and draped in the usual sterile fashion.    The patient is here for scheduled hyaluronic acid injection in his left knee with Synvisc 1 to treat the pain from osteoarthritis.  He has tried other conservative treatments including steroid injection.  The risk and benefits of these types of injections been explained in detail he does wish to proceed.  He has chronic pain in his left knee due to the osteoarthritis that is well-known.  He has medial lateral joint line tenderness today and no effusion.  There are some patellofemoral crepitation.  The knee is ligamentously stable.  I did place the injection without difficulty.  All question concerns were answered and addressed.  He understands the follow-up is as needed and if things worsen he can always come back for steroid injection in 3 months from now for a repeat hyaluronic acid injection in 6 months but only if he needs these.

## 2019-12-10 ENCOUNTER — Ambulatory Visit (INDEPENDENT_AMBULATORY_CARE_PROVIDER_SITE_OTHER): Payer: BC Managed Care – PPO | Admitting: Internal Medicine

## 2019-12-10 ENCOUNTER — Encounter: Payer: Self-pay | Admitting: Internal Medicine

## 2019-12-10 ENCOUNTER — Other Ambulatory Visit: Payer: Self-pay

## 2019-12-10 VITALS — BP 140/78 | HR 66 | Temp 97.2°F | Ht 70.0 in | Wt 290.0 lb

## 2019-12-10 DIAGNOSIS — G8929 Other chronic pain: Secondary | ICD-10-CM

## 2019-12-10 DIAGNOSIS — Z23 Encounter for immunization: Secondary | ICD-10-CM | POA: Diagnosis not present

## 2019-12-10 DIAGNOSIS — M19011 Primary osteoarthritis, right shoulder: Secondary | ICD-10-CM | POA: Insufficient documentation

## 2019-12-10 DIAGNOSIS — I5022 Chronic systolic (congestive) heart failure: Secondary | ICD-10-CM | POA: Diagnosis not present

## 2019-12-10 DIAGNOSIS — I25119 Atherosclerotic heart disease of native coronary artery with unspecified angina pectoris: Secondary | ICD-10-CM | POA: Diagnosis not present

## 2019-12-10 DIAGNOSIS — E1151 Type 2 diabetes mellitus with diabetic peripheral angiopathy without gangrene: Secondary | ICD-10-CM

## 2019-12-10 DIAGNOSIS — Z0000 Encounter for general adult medical examination without abnormal findings: Secondary | ICD-10-CM

## 2019-12-10 DIAGNOSIS — Z0001 Encounter for general adult medical examination with abnormal findings: Secondary | ICD-10-CM

## 2019-12-10 DIAGNOSIS — M19012 Primary osteoarthritis, left shoulder: Secondary | ICD-10-CM

## 2019-12-10 LAB — RENAL FUNCTION PANEL
Albumin: 4.6 g/dL (ref 3.5–5.2)
BUN: 13 mg/dL (ref 6–23)
CO2: 29 mEq/L (ref 19–32)
Calcium: 9.6 mg/dL (ref 8.4–10.5)
Chloride: 100 mEq/L (ref 96–112)
Creatinine, Ser: 0.9 mg/dL (ref 0.40–1.50)
GFR: 85.21 mL/min (ref >60.00)
Glucose, Bld: 149 mg/dL — ABNORMAL HIGH (ref 70–99)
Phosphorus: 3.6 mg/dL (ref 2.3–4.6)
Potassium: 4.7 mEq/L (ref 3.5–5.1)
Sodium: 138 mEq/L (ref 135–145)

## 2019-12-10 LAB — CBC
HCT: 43.8 % (ref 39.0–52.0)
Hemoglobin: 15.1 g/dL (ref 13.0–17.0)
MCHC: 34.5 g/dL (ref 30.0–36.0)
MCV: 83.3 fl (ref 78.0–100.0)
Platelets: 269 10*3/uL (ref 150.0–400.0)
RBC: 5.26 Mil/uL (ref 4.22–5.81)
RDW: 14.2 % (ref 11.5–15.5)
WBC: 9.1 10*3/uL (ref 4.0–10.5)

## 2019-12-10 LAB — HEPATIC FUNCTION PANEL
ALT: 27 U/L (ref 0–53)
AST: 21 U/L (ref 0–37)
Albumin: 4.6 g/dL (ref 3.5–5.2)
Alkaline Phosphatase: 91 U/L (ref 39–117)
Bilirubin, Direct: 0.2 mg/dL (ref 0.0–0.3)
Total Bilirubin: 0.9 mg/dL (ref 0.2–1.2)
Total Protein: 7.2 g/dL (ref 6.0–8.3)

## 2019-12-10 LAB — LIPID PANEL
Cholesterol: 125 mg/dL (ref 0–200)
HDL: 37.6 mg/dL — ABNORMAL LOW (ref >39.00)
NonHDL: 87.14
Total CHOL/HDL Ratio: 3
Triglycerides: 307 mg/dL — ABNORMAL HIGH (ref 0.0–149.0)
VLDL: 61.4 mg/dL — ABNORMAL HIGH (ref 0.0–40.0)

## 2019-12-10 LAB — LDL CHOLESTEROL, DIRECT: Direct LDL: 26 mg/dL

## 2019-12-10 LAB — HEMOGLOBIN A1C: Hgb A1c MFr Bld: 8 % — ABNORMAL HIGH (ref 4.6–6.5)

## 2019-12-10 LAB — T4, FREE: Free T4: 0.97 ng/dL (ref 0.60–1.60)

## 2019-12-10 MED ORDER — PANTOPRAZOLE SODIUM 40 MG PO TBEC: 40.0000 mg | DELAYED_RELEASE_TABLET | Freq: Every day | ORAL | 3 refills | Status: DC

## 2019-12-10 NOTE — Progress Notes (Signed)
Methylprednisolone 40mg /ml NDC: [id number] Exp Date 6/21 Lot Number [id number]

## 2019-12-10 NOTE — Assessment & Plan Note (Signed)
Seems to be compensated 

## 2019-12-10 NOTE — Assessment & Plan Note (Signed)
Hopefully still acceptable control Neuropathy as well On statin

## 2019-12-10 NOTE — Assessment & Plan Note (Signed)
Seems to have stable DOE Chest pain could also be GERD--will try omeprazole

## 2019-12-10 NOTE — Assessment & Plan Note (Signed)
Severe now Discussed options and he asks for injection  Sterile prep Ethyl chloride then 2cc 1% lidocaine 40mg  depomedrol/4cc 1% lidocaine instilled without difficulty Mild improvement in pain and ROM within 2-3 minutes Discussed home care

## 2019-12-10 NOTE — Progress Notes (Signed)
Subjective:    Patient ID: [name], male    DOB: [birth year], 63 y.o.   MRN: [id number]  HPI Here for physical  This visit occurred during the SARS-CoV-2 public health emergency.  Safety protocols were in place, including screening questions prior to the visit, additional usage of staff PPE, and extensive cleaning of exam room while observing appropriate contact time as indicated for disinfecting solutions.   Staying home with COVID for the most part Wears mask  Having more problems with arthritis Knees, ankles, etc Getting injections from Dr Ninfa Linden in knees--this helps (cortisone and then gel). This has helped Left shoulder bad again---would like another injection there Disk disease in back causes ongoing pain Takes 16 of the 500mg  tylenol a day---discussed he should cut down to no more than 6-8 per day Has tried voltaren gel--this helps some.  Checks sugars twice a week Wife checks--tells him it is good Ongoing vascular disease and disability  Current Outpatient Medications on File Prior to Visit  Medication Sig Dispense Refill  . atorvastatin (LIPITOR) 80 MG tablet TAKE 1 TABLET BY MOUTH  DAILY AT 6 PM. 90 tablet 3  . bisoprolol (ZEBETA) 10 MG tablet Take 1 tablet (10 mg total) by mouth daily. 90 tablet 2  . carvedilol (COREG) 3.125 MG tablet TAKE 1 TABLET BY MOUTH TWO  TIMES DAILY WITH A MEAL 180 tablet 3  . clopidogrel (PLAVIX) 75 MG tablet TAKE 1 TABLET BY MOUTH EVERY DAY 90 tablet 3  . glipiZIDE (GLUCOTROL) 5 MG tablet Take 1 tablet (5 mg total) by mouth 2 (two) times daily before a meal. 180 tablet 3  . losartan (COZAAR) 25 MG tablet TAKE 1 TABLET BY MOUTH  DAILY 90 tablet 3  . metFORMIN (GLUCOPHAGE-XR) 500 MG 24 hr tablet TAKE 2 TABLETS BY MOUTH EVERY DAY WITH BREAKFAST 180 tablet 0  . multivitamin (THERAGRAN) per tablet Take 1 tablet by mouth daily.     . nitroGLYCERIN (NITROSTAT) 0.4 MG SL tablet Place 1 tablet (0.4 mg total) under the tongue every 5 (five)  minutes as needed. (Patient taking differently: Place 0.4 mg under the tongue every 5 (five) minutes as needed for chest pain. ) 25 tablet 2  . spironolactone (ALDACTONE) 25 MG tablet TAKE ONE-HALF TABLET BY  MOUTH DAILY 45 tablet 3  . tiotropium (SPIRIVA) 18 MCG inhalation capsule Place 1 capsule (18 mcg total) into inhaler and inhale daily. 30 capsule 11  . VENTOLIN HFA 108 (90 Base) MCG/ACT inhaler TAKE 2 PUFFS BY MOUTH EVERY 6 HOURS AS NEEDED FOR WHEEZE OR SHORTNESS OF BREATH *USE W/ SPACER* 18 Inhaler 1  . vitamin B-12 (CYANOCOBALAMIN) 1000 MCG tablet Take 1,000 mcg by mouth daily.     . isosorbide mononitrate (IMDUR) 60 MG 24 hr tablet Take 1 tablet (60 mg total) by mouth daily. 90 tablet 3   No current facility-administered medications on file prior to visit.    No Known Allergies  Past Medical History:  Diagnosis Date  . Arthritis   . Automatic implantable cardioverter-defibrillator in situ   . Benign essential HTN 09/17/2016  . COPD (chronic obstructive pulmonary disease) ([place])    emphysema by CXR  . Coronary artery disease     LAD a 60-70% stenosis, moderate size second diagonal with 50% stenosis, circumflex AV groove 75-80% stenosis with a branching obtuse marginal with a superior branch subtotal stenosis followed by diffuse disease, right coronary artery  had nonobstructive disease.  EF was 35%  . Diabetes mellitus  type 2  no meds  . Diverticulosis of colon   . GERD (gastroesophageal reflux disease)    with esophagitis  . History of colonic polyps   . Hypertension   . Non-ischemic cardiomyopathy ([place])   . Presence of permanent cardiac pacemaker   . PVD (peripheral vascular disease) ([place])    bilateral common iliac artery aneurysms. Right SFA occlusion over a long segment. Left SFA disease with occlusion of the left TP trunk. Artirogram Oct. 2006  . Shortness of breath    exertion  . Sleep apnea   . Tobacco abuse   . Urinary incontinence    detrussor instability     Past Surgical History:  Procedure Laterality Date  . ABDOMINAL AORTIC ENDOVASCULAR STENT GRAFT N/A 05/07/2018   Procedure: ABDOMINAL AORTIC ENDOVASCULAR STENT GRAFT;  Surgeon: Conrad Elgin, MD;  Location: [facility];  Service: Vascular;  Laterality: N/A;  . CARDIAC CATHETERIZATION  06/2004   negative  . COLONOSCOPY  04/2005  . COPD exacerbation    . CORONARY ANGIOPLASTY  01/09/2018  . CORONARY STENT INTERVENTION N/A 01/10/2018   Procedure: CORONARY STENT INTERVENTION;  Surgeon: Lorretta Harp, MD;  Location: [facility];  Service: Cardiovascular;  Laterality: N/A;  . CORONARY/GRAFT ACUTE MI REVASCULARIZATION N/A 01/10/2018   Procedure: Coronary/Graft Acute MI Revascularization;  Surgeon: Lorretta Harp, MD;  Location: [facility];  Service: Cardiovascular;  Laterality: N/A;  . EMBOLIZATION Left 03/19/2018  . EMBOLIZATION Left 03/19/2018   Procedure: EMBOLIZATION - Left Internal;  Surgeon: Conrad Pana, MD;  Location: [facility];  Service: Cardiovascular;  Laterality: Left;  . EMBOLIZATION Right 04/16/2018   Procedure: EMBOLIZATION;  Surgeon: Conrad [place], MD;  Location: [facility];  Service: Cardiovascular;  Laterality: Right;  . FEMORAL ARTERY EXPLORATION Right 05/07/2018   Procedure: FEMORAL ARTERY EXPLORATION, EXTENDED PROFUNDAPLASTY;  Surgeon: Conrad Hudspeth, MD;  Location: [facility];  Service: Vascular;  Laterality: Right;  . INTRAOPERATIVE ARTERIOGRAM Right 05/07/2018   Procedure: INTRA OPERATIVE ARTERIOGRAM;  Surgeon: Conrad Topaz, MD;  Location: [facility];  Service: Vascular;  Laterality: Right;  . LEFT HEART CATH AND CORONARY ANGIOGRAPHY N/A 01/10/2018   Procedure: LEFT HEART CATH AND CORONARY ANGIOGRAPHY;  Surgeon: Lorretta Harp, MD;  Location: [facility];  Service: Cardiovascular;  Laterality: N/A;  . PACEMAKER INSERTION  11/2005  . PACEMAKER LEAD REMOVAL N/A 10/17/2014   Procedure: PACEMAKER LEAD REMOVAL;  Surgeon: Evans Lance, MD;   Location: [facility];  Service: Cardiovascular;  Laterality: N/A;  . RENAL ANGIOGRAPHY Left 04/16/2018   Procedure: RENAL ANGIOGRAPHY;  Surgeon: Conrad Stewart, MD;  Location: [facility];  Service: Cardiovascular;  Laterality: Left;  . s/p ICD placement      [employer] Maximo [phone] single chamber    Family History  Problem Relation Age of Onset  . Stroke Father   . Coronary artery disease Maternal Aunt   . Heart failure Maternal Aunt   . Lung cancer Maternal Aunt   . Lung cancer Maternal Uncle   . Cancer Brother        Mouth  . Cancer Sister        throat    Social History   Socioeconomic History  . Marital status: Married    Spouse name: Not on file  . Number of children: 2  . Years of education: Not on file  . Highest education level: Not on file  Occupational History  .  Occupation: Grave digger--now disabled  Tobacco Use  . Smoking status: Former Smoker    Packs/day: 2.00    Years: 15.00    Pack years: 30.00    Types: Cigarettes    Quit date: 01/09/2018    Years since quitting: 1.9  . Smokeless tobacco: Never Used  . Tobacco comment: GAVE 1-800-QUIT-NOW  Substance and Sexual Activity  . Alcohol use: No  . Drug use: No  . Sexual activity: Never  Other Topics Concern  . Not on file  Social History Narrative   No living will   Requests wife as health care POA   Would accept resuscitation but doesn't want prolonged ventilation   No tube feeds if cognitively unaware   Social Determinants of Health   Financial Resource Strain:   . Difficulty of Paying Living Expenses: Not on file  Food Insecurity:   . Worried About Charity fundraiser in the Last Year: Not on file  . Ran Out of Food in the Last Year: Not on file  Transportation Needs:   . Lack of Transportation (Medical): Not on file  . Lack of Transportation (Non-Medical): Not on file  Physical Activity:   . Days of Exercise per Week: Not on file  . Minutes of Exercise per Session: Not on file  Stress:    . Feeling of Stress : Not on file  Social Connections:   . Frequency of Communication with Friends and Family: Not on file  . Frequency of Social Gatherings with Friends and Family: Not on file  . Attends Religious Services: Not on file  . Active Member of Clubs or Organizations: Not on file  . Attends Archivist Meetings: Not on file  . Marital Status: Not on file  Intimate Partner Violence:   . Fear of Current or Ex-Partner: Not on file  . Emotionally Abused: Not on file  . Physically Abused: Not on file  . Sexually Abused: Not on file   Review of Systems  Constitutional:       Weight up some Not exercising Wears seat belt  HENT: Positive for hearing loss and tinnitus.        Some pain with swallowing-- especially meat No teeth  Eyes: Negative for visual disturbance.       No diplopia or unilateral vision loss Needs new glasses  Respiratory: Positive for cough and wheezing.        Easy DOE--stable (will use inhaler)  Cardiovascular: Positive for leg swelling. Negative for palpitations.       Still gets upper chest pain with exertion and sometimes after eating (meat)  Gastrointestinal: Negative for blood in stool and constipation.       ?heartburn  Endocrine: Positive for polydipsia. Negative for polyuria.  Genitourinary: Negative for difficulty urinating and urgency.       Nocturia x 4-5 No sex  Musculoskeletal: Positive for arthralgias and back pain.  Skin: Negative for rash.  Allergic/Immunologic: Negative for environmental allergies and immunocompromised state.       Occasional sneezing  Neurological: Negative for dizziness, syncope, light-headedness and headaches.  Hematological: Negative for adenopathy. Bruises/bleeds easily.  Psychiatric/Behavioral: The patient is not nervous/anxious.        Doesn't sleep well. Told he has apnea--but doesn't use a machine Only sleeps 1-2 hours at a time---like when he used to work 2 full time jobs Still frustrated/mild  depression about being disabled       Objective:   Physical Exam  Constitutional: He is  oriented to person, place, and time. He appears well-developed. No distress.  HENT:  Head: Normocephalic and atraumatic.  Right Ear: External ear normal.  Left Ear: External ear normal.  Mouth/Throat: Oropharynx is clear and moist. No oropharyngeal exudate.  Eyes: Pupils are equal, round, and reactive to light. Conjunctivae are normal.  Neck: No thyromegaly present.  Cardiovascular: Normal rate, regular rhythm and normal heart sounds. Exam reveals no gallop.  No murmur heard. Feet warm but no pulses  Respiratory: Effort normal. No respiratory distress. He has no wheezes. He has no rales.  Decreased breath sounds but clear  GI: Soft. There is no abdominal tenderness.  Musculoskeletal:     Comments: Trace edema Left shoulder with sig decreased ROM and pain  Lymphadenopathy:    He has no cervical adenopathy.  Neurological: He is alert and oriented to person, place, and time.  Little sensation in feet  Skin:  No foot lesions  Psychiatric:  Mildly down but appropriate affect           Assessment & Plan:

## 2019-12-10 NOTE — Progress Notes (Signed)
Hearing Screening   125Hz  250Hz  500Hz  1000Hz  2000Hz  3000Hz  4000Hz  6000Hz  8000Hz   Right ear:           Left ear:           Comments: Has hearing aids. Has 1 in the left ear today.   Visual Acuity Screening   Right eye Left eye Both eyes  Without correction: 20/25 20/15 20/15   With correction:

## 2019-12-10 NOTE — Assessment & Plan Note (Signed)
Prefers no cancer screening at this point Flu vaccine today Not able to exercise

## 2019-12-10 NOTE — Assessment & Plan Note (Addendum)
Back and multiple joints  Will refer to physiatry to see if they can help

## 2019-12-12 ENCOUNTER — Other Ambulatory Visit: Payer: Self-pay | Admitting: Adult Health

## 2019-12-21 ENCOUNTER — Ambulatory Visit
Admission: EM | Admit: 2019-12-21 | Discharge: 2019-12-21 | Disposition: A | Discharge status: Home / Self Care | Payer: BC Managed Care – PPO | Attending: Emergency Medicine | Admitting: Emergency Medicine

## 2019-12-21 ENCOUNTER — Telehealth: Payer: Self-pay | Admitting: Internal Medicine

## 2019-12-21 ENCOUNTER — Other Ambulatory Visit: Payer: Self-pay

## 2019-12-21 DIAGNOSIS — J029 Acute pharyngitis, unspecified: Secondary | ICD-10-CM

## 2019-12-21 DIAGNOSIS — R0602 Shortness of breath: Secondary | ICD-10-CM

## 2019-12-21 DIAGNOSIS — R1111 Vomiting without nausea: Secondary | ICD-10-CM | POA: Diagnosis not present

## 2019-12-21 DIAGNOSIS — R05 Cough: Secondary | ICD-10-CM | POA: Diagnosis not present

## 2019-12-21 DIAGNOSIS — R0981 Nasal congestion: Secondary | ICD-10-CM | POA: Diagnosis not present

## 2019-12-21 DIAGNOSIS — U071 COVID-19: Secondary | ICD-10-CM | POA: Diagnosis not present

## 2019-12-21 DIAGNOSIS — R059 Cough, unspecified: Secondary | ICD-10-CM

## 2019-12-21 LAB — POC SARS CORONAVIRUS 2 AG -  ED: SARS Coronavirus 2 Ag: POSITIVE — AB

## 2019-12-21 MED ORDER — ONDANSETRON HCL 4 MG PO TABS: 4.0000 mg | ORAL_TABLET | Freq: Four times a day (QID) | ORAL | 0 refills | Status: DC | PRN

## 2019-12-21 NOTE — ED Triage Notes (Signed)
Pt presents with complaints of cough, congestion, runny nose, sore throat, wheezing and nausea since last night.

## 2019-12-21 NOTE — Telephone Encounter (Signed)
I think it would be better if he gets seen in addition to testing. I would recommend a visit at a [facility] urgent care

## 2019-12-21 NOTE — Telephone Encounter (Signed)
Pt having prod cough, congestion, not feeling well x 1 day.  Denies fever, increased SOB.  No known exposure to anyone with Covid or that's sick.  For the Holidays they were only around immediate family and no one was sick.  Pt laid around in bed all day yesterday. No body aches.   Wife is concerned and feels that he may need to be tested.   Aware that I will send to Dr Silvio Pate for further recommendations given his hx of COPD and exacerbations.  Please advise, thanks.   No Known Allergies

## 2019-12-21 NOTE — Telephone Encounter (Signed)
Spoke to pt's wife per DPR. Gave her information on the [facility] in [place]. She will call and make him an appointment.

## 2019-12-21 NOTE — ED Provider Notes (Signed)
[name]    CSN: [id number] Arrival date & time: 12/21/19  1145      History   Chief Complaint Chief Complaint  Patient presents with  . Cough    HPI [name] is a 63 y.o. male.   Patient presents with chills, nonproductive cough, nasal congestion, rhinorrhea, sore throat, shortness of breath, and nausea x 1 day.  He denies fever, vomiting, diarrhea, rash, or other symptoms.  No treatments attempted at home.    The history is provided by the patient.    Past Medical History:  Diagnosis Date  . Arthritis   . Automatic implantable cardioverter-defibrillator in situ   . Benign essential HTN 09/17/2016  . COPD (chronic obstructive pulmonary disease) ([place])    emphysema by CXR  . Coronary artery disease     LAD a 60-70% stenosis, moderate size second diagonal with 50% stenosis, circumflex AV groove 75-80% stenosis with a branching obtuse marginal with a superior branch subtotal stenosis followed by diffuse disease, right coronary artery  had nonobstructive disease.  EF was 35%  . Diabetes mellitus    type 2  no meds  . Diverticulosis of colon   . GERD (gastroesophageal reflux disease)    with esophagitis  . History of colonic polyps   . Hypertension   . Non-ischemic cardiomyopathy ([place])   . Presence of permanent cardiac pacemaker   . PVD (peripheral vascular disease) ([place])    bilateral common iliac artery aneurysms. Right SFA occlusion over a long segment. Left SFA disease with occlusion of the left TP trunk. Artirogram Oct. 2006  . Shortness of breath    exertion  . Sleep apnea   . Tobacco abuse   . Urinary incontinence    detrussor instability    Patient Active Problem List   Diagnosis Date Noted  . Osteoarthritis of left shoulder 12/10/2019  . Knee pain, left 06/29/2019  . Cervical sprain 01/28/2019  . Subacromial impingement of left shoulder 01/19/2019  . Type 2 diabetes mellitus with diabetic neuropathy ([place]) 01/19/2019  . PVD (peripheral  vascular disease) ([place]) 10/13/2018  . PVC's (premature ventricular contractions) 10/13/2018  . Iliac artery occlusion (HCC) 03/19/2018  . Aneurysm of iliac artery ([place]) 03/06/2018  . Status post coronary artery stent placement   . AAA (abdominal aortic aneurysm) without rupture ([place])   . Bilateral carotid artery stenosis 04/28/2017  . Chronic pain 04/14/2017  . Cerumen impaction 11/25/2016  . Benign essential HTN 09/17/2016  . CAD (coronary artery disease), native coronary artery 09/17/2016  . Mood disorder ([place]) 11/29/2015  . Left leg swelling 05/15/2015  . COPD exacerbation ([place]) 12/15/2012  . Routine general medical examination at a health care facility 10/26/2012  . Atherosclerosis of native coronary artery with angina pectoris ([place]) 10/17/2011  . OSA (obstructive sleep apnea) 08/28/2011  . Obesity 08/27/2011  . Chronic systolic heart failure ([place]) 08/06/2011  . B12 DEFICIENCY 04/14/2009  . CAROTID BRUIT 02/16/2009  . Diabetic polyneuropathy ([place]) 12/07/2008  . URINARY INCONTINENCE 05/27/2008  . COPD (chronic obstructive pulmonary disease) with emphysema ([place]) 02/09/2008  . Hyperlipemia 10/13/2007  . DIVERTICULOSIS, COLON 09/15/2007  . COLONIC POLYPS, HX OF 09/15/2007  . Type 2 diabetes, controlled, with peripheral circulatory disorder ([place]) 05/25/2007  . GERD 05/25/2007  . REFLUX ESOPHAGITIS 04/23/2007    Past Surgical History:  Procedure Laterality Date  . ABDOMINAL AORTIC ENDOVASCULAR STENT GRAFT N/A 05/07/2018   Procedure: ABDOMINAL AORTIC ENDOVASCULAR STENT GRAFT;  Surgeon: Conrad [place]igg[place], MD;  Location: [facility]  OR;  Service: Vascular;  Laterality: N/A;  . CARDIAC CATHETERIZATION  06/2004   negative  . COLONOSCOPY  04/2005  . COPD exacerbation    . CORONARY ANGIOPLASTY  01/09/2018  . CORONARY STENT INTERVENTION N/A 01/10/2018   Procedure: CORONARY STENT INTERVENTION;  Surgeon: Lorretta Harp, MD;  Location: [place] [facility];  Service: Cardiovascular;  Laterality: N/A;    . CORONARY/GRAFT ACUTE MI REVASCULARIZATION N/A 01/10/2018   Procedure: Coronary/Graft Acute MI Revascularization;  Surgeon: Lorretta Harp, MD;  Location: [place] [facility];  Service: Cardiovascular;  Laterality: N/A;  . EMBOLIZATION Left 03/19/2018  . EMBOLIZATION Left 03/19/2018   Procedure: EMBOLIZATION - Left Internal;  Surgeon: Conrad Teter[place], MD;  Location: [place] [facility];  Service: Cardiovascular;  Laterality: Left;  . EMBOLIZATION Right 04/16/2018   Procedure: EMBOLIZATION;  Surgeon: Conrad Crowheart, MD;  Location: [place] [facility];  Service: Cardiovascular;  Laterality: Right;  . FEMORAL ARTERY EXPLORATION Right 05/07/2018   Procedure: FEMORAL ARTERY EXPLORATION, EXTENDED PROFUNDAPLASTY;  Surgeon: Conrad [place], MD;  Location: [place];  Service: Vascular;  Laterality: Right;  . INTRAOPERATIVE ARTERIOGRAM Right 05/07/2018   Procedure: INTRA OPERATIVE ARTERIOGRAM;  Surgeon: Conrad Sab[place] Ene[place], MD;  Location: [place];  Service: Vascular;  Laterality: Right;  . LEFT HEART CATH AND CORONARY ANGIOGRAPHY N/A 01/10/2018   Procedure: LEFT HEART CATH AND CORONARY ANGIOGRAPHY;  Surgeon: Lorretta Harp, MD;  Location: [place] [facility];  Service: Cardiovascular;  Laterality: N/A;  . PACEMAKER INSERTION  11/2005  . PACEMAKER LEAD REMOVAL N/A 10/17/2014   Procedure: PACEMAKER LEAD REMOVAL;  Surgeon: Evans Lance, MD;  Location: [place];  Service: Cardiovascular;  Laterality: N/A;  . RENAL ANGIOGRAPHY Left 04/16/2018   Procedure: RENAL ANGIOGRAPHY;  Surgeon: Conrad , MD;  Location: [place] [facility];  Service: Cardiovascular;  Laterality: Left;  . s/p ICD placement      [employer] Maximo [phone] single chamber       Home Medications    Prior to Admission medications   Medication Sig Start Date End Date Taking? Authorizing Provider  atorvastatin (LIPITOR) 80 MG tablet TAKE 1 TABLET BY MOUTH  DAILY AT 6 PM. 12/13/19   Lendon Colonel, NP  bisoprolol (ZEBETA) 10 MG tablet Take 1 tablet (10  mg total) by mouth daily. 07/01/19   Lendon Colonel, NP  carvedilol (COREG) 3.125 MG tablet TAKE 1 TABLET BY MOUTH TWO  TIMES DAILY WITH A MEAL 10/25/19   Minus Breeding, MD  clopidogrel (PLAVIX) 75 MG tablet TAKE 1 TABLET BY MOUTH EVERY DAY 08/29/19   Waynetta Sandy, MD  glipiZIDE (GLUCOTROL) 5 MG tablet Take 1 tablet (5 mg total) by mouth 2 (two) times daily before a meal. 06/08/19   Venia Carbon, MD  isosorbide mononitrate (IMDUR) 60 MG 24 hr tablet Take 1 tablet (60 mg total) by mouth daily. 08/23/19 11/21/19  Minus Breeding, MD  losartan (COZAAR) 25 MG tablet TAKE 1 TABLET BY MOUTH  DAILY 12/13/19   Lendon Colonel, NP  metFORMIN (GLUCOPHAGE-XR) 500 MG 24 hr tablet TAKE 2 TABLETS BY MOUTH EVERY DAY WITH BREAKFAST 10/06/19   Venia Carbon, MD  multivitamin [facility]) per tablet Take 1 tablet by mouth daily.     [provider]  nitroGLYCERIN (NITROSTAT) 0.4 MG SL tablet Place 1 tablet (0.4 mg total) under the tongue every 5 (five) minutes as needed. Patient taking differently: Place 0.4 mg under the tongue every 5 (five)  minutes as needed for chest pain.  01/13/18   Cheryln Manly, NP  ondansetron (ZOFRAN) 4 MG tablet Take 1 tablet (4 mg total) by mouth every 6 (six) hours as needed for nausea or vomiting. 12/21/19   Sharion Balloon, NP  pantoprazole (PROTONIX) 40 MG tablet Take 1 tablet (40 mg total) by mouth daily. 12/10/19   Venia Carbon, MD  spironolactone (ALDACTONE) 25 MG tablet TAKE ONE-HALF TABLET BY  MOUTH DAILY 12/13/19   Lendon Colonel, NP  tiotropium (SPIRIVA) 18 MCG inhalation capsule Place 1 capsule (18 mcg total) into inhaler and inhale daily. 07/21/18   Venia Carbon, MD  VENTOLIN HFA 108 (90 Base) MCG/ACT inhaler TAKE 2 PUFFS BY MOUTH EVERY 6 HOURS AS NEEDED FOR WHEEZE OR SHORTNESS OF BREATH *USE W/ SPACER* 05/26/19   Venia Carbon, MD  vitamin B-12 (CYANOCOBALAMIN) 1000 MCG tablet Take 1,000 mcg by mouth daily.     [provider]    Family History Family History  Problem Relation Age of Onset  . Stroke Father   . Coronary artery disease Maternal Aunt   . Heart failure Maternal Aunt   . Lung cancer Maternal Aunt   . Lung cancer Maternal Uncle   . Cancer Brother        Mouth  . Cancer Sister        throat    Social History Social History   Tobacco Use  . Smoking status: Former Smoker    Packs/day: 2.00    Years: 15.00    Pack years: 30.00    Types: Cigarettes    Quit date: 01/09/2018    Years since quitting: 1.9  . Smokeless tobacco: Never Used  . Tobacco comment: GAVE 1-800-QUIT-NOW  Substance Use Topics  . Alcohol use: No  . Drug use: No     Allergies   Patient has no known allergies.   Review of Systems Review of Systems  Constitutional: Positive for chills. Negative for fever.  HENT: Positive for congestion, rhinorrhea and sore throat. Negative for ear pain.   Eyes: Negative for pain and visual disturbance.  Respiratory: Positive for cough and shortness of breath.   Cardiovascular: Negative for chest pain and palpitations.  Gastrointestinal: Positive for nausea. Negative for abdominal pain, diarrhea and vomiting.  Genitourinary: Negative for dysuria and hematuria.  Musculoskeletal: Negative for arthralgias and back pain.  Skin: Negative for color change and rash.  Neurological: Negative for seizures and syncope.  All other systems reviewed and are negative.    Physical Exam Triage Vital Signs ED Triage Vitals  Enc Vitals Group     BP      Pulse      Resp      Temp      Temp src      SpO2      Weight      Height      Head Circumference      Peak Flow      Pain Score      Pain Loc      Pain Edu?      Excl. in [place]?    No data found.  Updated Vital Signs BP 118/78 (BP Location: Left Arm)   Pulse 70   Temp 98.9 F (37.2 C)   Resp 18   SpO2 95%   Visual Acuity Right Eye Distance:   Left Eye Distance:   Bilateral Distance:    Right Eye Near:  Left Eye Near:    Bilateral Near:     Physical Exam Vitals and nursing note reviewed.  Constitutional:      General: He is not in acute distress.    Appearance: He is well-developed. He is ill-appearing.  HENT:     Head: Normocephalic and atraumatic.     Right Ear: Tympanic membrane normal.     Left Ear: Tympanic membrane normal.     Nose: Congestion present.     Mouth/Throat:     Mouth: Mucous membranes are moist.     Pharynx: Oropharynx is clear.  Eyes:     Conjunctiva/sclera: Conjunctivae normal.  Cardiovascular:     Rate and Rhythm: Normal rate and regular rhythm.     Heart sounds: No murmur.  Pulmonary:     Effort: Pulmonary effort is normal. No respiratory distress.     Breath sounds: Normal breath sounds. No wheezing or rhonchi.  Abdominal:     General: Bowel sounds are normal.     Palpations: Abdomen is soft.     Tenderness: There is no abdominal tenderness. There is no guarding or rebound.  Musculoskeletal:     Cervical back: Neck supple.  Skin:    General: Skin is warm and dry.     Findings: No rash.  Neurological:     General: No focal deficit present.     Mental Status: He is alert and oriented to person, place, and time.  Psychiatric:        Mood and Affect: Mood normal.        Behavior: Behavior normal.      UC Treatments / Results  Labs (all labs ordered are listed, but only abnormal results are displayed) Labs Reviewed  POC SARS CORONAVIRUS 2 AG -  ED - Abnormal; Notable for the following components:      Result Value   SARS Coronavirus 2 Ag Positive (*)    All other components within normal limits    EKG   Radiology No results found.  Procedures Procedures (including critical care time)  Medications Ordered in UC Medications - No data to display  Initial Impression / Assessment and Plan / UC Course  I have reviewed the triage vital signs and the nursing notes.  Pertinent labs & imaging results that were available during my care of  the patient were reviewed by me and considered in my medical decision making (see chart for details).   COVID-19.  POC COVID test positive.  Treating nausea with Zofran.  Instructed patient to take Tylenol as needed for fever or discomfort.  Instructed him to self quarantine per CDC guidelines.  Discussed with patient that he should go to the emergency department if he has shortness of breath, diarrhea, high fever, or other concerning symptoms.     Final Clinical Impressions(s) / UC Diagnoses   Final diagnoses:  Cough  COVID-19     Discharge Instructions     Your COVID test is positive.  Take Tylenol as needed for fever or discomfort.  Take the prescribed anti-nausea medication as needed.    You should self-quarantine for:  *10 days since your symptoms first appeared and  *24 hours with no fever, without the use of fever-reducing medications and  *your other symptoms of COVID are improving.  Most people do not need to be re-tested at the end of the quarantine period.    Go to the emergency department if you have high fever not relieved by Tylenol, shortness of breath, severe  diarrhea, or other concerning symptoms.       ED Prescriptions    Medication Sig Dispense Auth. Provider   ondansetron (ZOFRAN) 4 MG tablet Take 1 tablet (4 mg total) by mouth every 6 (six) hours as needed for nausea or vomiting. 12 tablet Sharion Balloon, NP     PDMP not reviewed this encounter.   Sharion Balloon, NP 12/21/19 1240

## 2019-12-21 NOTE — Discharge Instructions (Addendum)
Your COVID test is positive.  Take Tylenol as needed for fever or discomfort.  Take the prescribed anti-nausea medication as needed.    You should self-quarantine for:  *10 days since your symptoms first appeared and  *24 hours with no fever, without the use of fever-reducing medications and  *your other symptoms of COVID are improving.  Most people do not need to be re-tested at the end of the quarantine period.    Go to the emergency department if you have high fever not relieved by Tylenol, shortness of breath, severe diarrhea, or other concerning symptoms.

## 2019-12-22 ENCOUNTER — Other Ambulatory Visit: Payer: Self-pay | Admitting: Nurse Practitioner

## 2019-12-22 ENCOUNTER — Telehealth: Payer: Self-pay | Admitting: Nurse Practitioner

## 2019-12-22 DIAGNOSIS — E1151 Type 2 diabetes mellitus with diabetic peripheral angiopathy without gangrene: Secondary | ICD-10-CM

## 2019-12-22 DIAGNOSIS — U071 COVID-19: Secondary | ICD-10-CM

## 2019-12-22 NOTE — Telephone Encounter (Signed)
Called to Discuss with patient about Covid symptoms and the use of bamlanivimab, a monoclonal antibody infusion for those with mild to moderate Covid symptoms and at a high risk of hospitalization.     Pt is qualified for this infusion at the [facility] infusion center due to co-morbid conditions and/or a member of an at-risk group.     Patient Active Problem List   Diagnosis Date Noted  . Osteoarthritis of left shoulder 12/10/2019  . Knee pain, left 06/29/2019  . Cervical sprain 01/28/2019  . Subacromial impingement of left shoulder 01/19/2019  . Type 2 diabetes mellitus with diabetic neuropathy ([place]) 01/19/2019  . PVD (peripheral vascular disease) ([place]) 10/13/2018  . PVC's (premature ventricular contractions) 10/13/2018  . Iliac artery occlusion (HCC) 03/19/2018  . Aneurysm of iliac artery ([place]) 03/06/2018  . Status post coronary artery stent placement   . AAA (abdominal aortic aneurysm) without rupture ([place])   . Bilateral carotid artery stenosis 04/28/2017  . Chronic pain 04/14/2017  . Cerumen impaction 11/25/2016  . Benign essential HTN 09/17/2016  . CAD (coronary artery disease), native coronary artery 09/17/2016  . Mood disorder ([place]) 11/29/2015  . Left leg swelling 05/15/2015  . COPD exacerbation ([place]) 12/15/2012  . Routine general medical examination at a health care facility 10/26/2012  . Atherosclerosis of native coronary artery with angina pectoris ([place]) 10/17/2011  . OSA (obstructive sleep apnea) 08/28/2011  . Obesity 08/27/2011  . Chronic systolic heart failure ([place]) 08/06/2011  . B12 DEFICIENCY 04/14/2009  . CAROTID BRUIT 02/16/2009  . Diabetic polyneuropathy ([place]) 12/07/2008  . URINARY INCONTINENCE 05/27/2008  . COPD (chronic obstructive pulmonary disease) with emphysema ([place]) 02/09/2008  . Hyperlipemia 10/13/2007  . DIVERTICULOSIS, COLON 09/15/2007  . COLONIC POLYPS, HX OF 09/15/2007  . Type 2 diabetes, controlled, with peripheral circulatory disorder ([place])  05/25/2007  . GERD 05/25/2007  . REFLUX ESOPHAGITIS 04/23/2007    Patient would like to think about it and discuss this with his wife. Symptoms tier reviewed as well as criteria for ending isolation. Preventative practices reviewed. Patient verbalized understanding.    Patient advised to call back if he decides that he does want to get infusion. Callback number to the infusion center given. Patient advised to go to Urgent care or ED with severe symptoms.

## 2019-12-22 NOTE — Progress Notes (Signed)
  I connected by phone with [name] on 12/22/2019 at 2:20 PM to discuss the potential use of an new treatment for mild to moderate COVID-19 viral infection in non-hospitalized patients.  This patient is a 63 y.o. male that meets the FDA criteria for Emergency Use Authorization of bamlanivimab or casirivimab\imdevimab.  Has a (+) direct SARS-CoV-2 viral test result  Has mild or moderate COVID-19   Is ? 63 years of age and weighs ? 40 kg  Is NOT hospitalized due to COVID-19  Is NOT requiring oxygen therapy or requiring an increase in baseline oxygen flow rate due to COVID-19  Is within 10 days of symptom onset  Has at least one of the high risk factor(s) for progression to severe COVID-19 and/or hospitalization as defined in EUA.  Specific high risk criteria : Diabetes   I have spoken and communicated the following to the patient or parent/caregiver:  1. FDA has authorized the emergency use of bamlanivimab and casirivimab\imdevimab for the treatment of mild to moderate COVID-19 in adults and pediatric patients with positive results of direct SARS-CoV-2 viral testing who are 98 years of age and older weighing at least 40 kg, and who are at high risk for progressing to severe COVID-19 and/or hospitalization.  2. The significant known and potential risks and benefits of bamlanivimab and casirivimab\imdevimab, and the extent to which such potential risks and benefits are unknown.  3. Information on available alternative treatments and the risks and benefits of those alternatives, including clinical trials.  4. Patients treated with bamlanivimab and casirivimab\imdevimab should continue to self-isolate and use infection control measures (e.g., wear mask, isolate, social distance, avoid sharing personal items, clean and disinfect "high touch" surfaces, and frequent handwashing) according to CDC guidelines.   5. The patient or parent/caregiver has the option to accept or refuse  bamlanivimab or casirivimab\imdevimab .  After reviewing this information with the patient, The patient agreed to proceed with receiving the bamlanimivab infusion and will be provided a copy of the Fact sheet prior to receiving the infusion.[name] 12/22/2019 2:20 PM

## 2019-12-27 ENCOUNTER — Ambulatory Visit (HOSPITAL_COMMUNITY)
Admission: RE | Admit: 2019-12-27 | Discharge: 2019-12-27 | Disposition: A | Discharge status: Home / Self Care | Payer: BC Managed Care – PPO | Source: Ambulatory Visit | Attending: Pulmonary Disease | Admitting: Pulmonary Disease

## 2019-12-27 DIAGNOSIS — Z23 Encounter for immunization: Secondary | ICD-10-CM | POA: Diagnosis not present

## 2019-12-27 DIAGNOSIS — U071 COVID-19: Secondary | ICD-10-CM | POA: Diagnosis not present

## 2019-12-27 DIAGNOSIS — E1151 Type 2 diabetes mellitus with diabetic peripheral angiopathy without gangrene: Secondary | ICD-10-CM | POA: Insufficient documentation

## 2019-12-27 MED ORDER — METHYLPREDNISOLONE SODIUM SUCC 125 MG IJ SOLR: 125.0000 mg | Freq: Once | INTRAMUSCULAR | Status: DC | PRN

## 2019-12-27 MED ORDER — SODIUM CHLORIDE 0.9 % IV SOLN
INTRAVENOUS | Status: DC | PRN
  Administered 2019-12-27: 250 mL via INTRAVENOUS

## 2019-12-27 MED ORDER — FAMOTIDINE IN NACL 20-0.9 MG/50ML-% IV SOLN: 20.0000 mg | Freq: Once | INTRAVENOUS | Status: DC | PRN

## 2019-12-27 MED ORDER — DIPHENHYDRAMINE HCL 50 MG/ML IJ SOLN: 50.0000 mg | Freq: Once | INTRAMUSCULAR | Status: DC | PRN

## 2019-12-27 MED ORDER — EPINEPHRINE 0.3 MG/0.3ML IJ SOAJ: 0.3000 mg | Freq: Once | INTRAMUSCULAR | Status: DC | PRN

## 2019-12-27 MED ORDER — SODIUM CHLORIDE 0.9 % IV SOLN
700.0000 mg | Freq: Once | INTRAVENOUS | Status: AC
  Administered 2019-12-27: 700 mg via INTRAVENOUS
  Filled 2019-12-27: qty 20

## 2019-12-27 MED ORDER — ALBUTEROL SULFATE HFA 108 (90 BASE) MCG/ACT IN AERS: 2.0000 | INHALATION_SPRAY | Freq: Once | RESPIRATORY_TRACT | Status: DC | PRN

## 2019-12-27 NOTE — Discharge Instructions (Signed)

## 2019-12-27 NOTE — Progress Notes (Signed)
  Diagnosis: COVID-19  Physician:  Procedure: Covid Infusion Clinic Med: bamlanivimab infusion - Provided patient with bamlanimivab fact sheet for patients, parents and caregivers prior to infusion.  Complications: No immediate complications noted.  Discharge: Discharged home   Virgilio Belling 12/27/2019

## 2020-01-18 ENCOUNTER — Encounter: Payer: BC Managed Care – PPO | Admitting: Physical Medicine & Rehabilitation

## 2020-01-18 ENCOUNTER — Other Ambulatory Visit: Payer: Self-pay | Admitting: Internal Medicine

## 2020-02-02 ENCOUNTER — Other Ambulatory Visit: Payer: Self-pay | Admitting: Adult Health

## 2020-02-03 ENCOUNTER — Other Ambulatory Visit: Payer: Self-pay

## 2020-02-03 ENCOUNTER — Encounter: Payer: Self-pay | Admitting: Adult Health

## 2020-02-03 ENCOUNTER — Ambulatory Visit (INDEPENDENT_AMBULATORY_CARE_PROVIDER_SITE_OTHER): Payer: BC Managed Care – PPO | Admitting: Adult Health

## 2020-02-03 ENCOUNTER — Other Ambulatory Visit: Payer: Self-pay | Admitting: Adult Health

## 2020-02-03 VITALS — BP 118/80 | HR 68 | Ht 69.0 in | Wt 288.6 lb

## 2020-02-03 DIAGNOSIS — Z7189 Other specified counseling: Secondary | ICD-10-CM

## 2020-02-03 DIAGNOSIS — Z79899 Other long term (current) drug therapy: Secondary | ICD-10-CM

## 2020-02-03 DIAGNOSIS — E1140 Type 2 diabetes mellitus with diabetic neuropathy, unspecified: Secondary | ICD-10-CM

## 2020-02-03 DIAGNOSIS — I200 Unstable angina: Secondary | ICD-10-CM

## 2020-02-03 DIAGNOSIS — R079 Chest pain, unspecified: Secondary | ICD-10-CM

## 2020-02-03 DIAGNOSIS — I10 Essential (primary) hypertension: Secondary | ICD-10-CM | POA: Diagnosis not present

## 2020-02-03 DIAGNOSIS — I514 Myocarditis, unspecified: Secondary | ICD-10-CM

## 2020-02-03 DIAGNOSIS — I2510 Atherosclerotic heart disease of native coronary artery without angina pectoris: Secondary | ICD-10-CM

## 2020-02-03 MED ORDER — NITROGLYCERIN 0.4 MG SL SUBL: 0.4000 mg | SUBLINGUAL_TABLET | SUBLINGUAL | 2 refills | Status: DC | PRN

## 2020-02-03 NOTE — Patient Instructions (Addendum)
Medication Instructions:  Continue current medications  *If you need a refill on your cardiac medications before your next appointment, please call your pharmacy*  Lab Work: Pre Op Lab work   If you have labs (blood work) drawn today and your tests are completely normal, you will receive your results only by: Marland Kitchen MyChart Message (if you have MyChart) OR . A paper copy in the mail If you have any lab test that is abnormal or we need to change your treatment, we will call you to review the results.  Testing/Procedures: Your physician has requested that you have a left heart cardiac catheterization. Cardiac catheterization is used to diagnose and/or treat various heart conditions. Doctors may recommend this procedure for a number of different reasons. The most common reason is to evaluate chest pain. Chest pain can be a symptom of coronary artery disease (CAD), and cardiac catheterization can show whether plaque is narrowing or blocking your heart's arteries. This procedure is also used to evaluate the valves, as well as measure the blood flow and oxygen levels in different parts of your heart. For further information please visit [email or web address]. Please follow instruction sheet, as given.  Your physician has requested that you have an echocardiogram. Echocardiography is a painless test that uses sound waves to create images of your heart. It provides your doctor with information about the size and shape of your heart and how well your heart's chambers and valves are working. This procedure takes approximately one hour. There are no restrictions for this procedure.   Follow-Up: At [facility], you and your health needs are our priority.  As part of our continuing mission to provide you with exceptional heart care, we have created designated Provider Care Teams.  These Care Teams include your primary Cardiologist (physician) and Advanced Practice Providers (APPs -  Physician Assistants and Nurse  Practitioners) who all work together to provide you with the care you need, when you need it.  Your next appointment:   3 week(s)  The format for your next appointment:   In Person  Provider:   Minus Breeding, MD  Other Instructions    [place] [address] Dept: [phone] Loc: [place]  02/03/2020  You are scheduled for a Cardiac Catheterization on Tuesday, February 16 with Dr. Shelva Majestic.  1. Please arrive at the [facility] (Main Entrance A) at [facility]: [address] [place], [place] [address] at 10:00 AM (This time is two hours before your procedure to ensure your preparation). Free valet parking service is available.   Special note: Every effort is made to have your procedure done on time. Please understand that emergencies sometimes delay scheduled procedures.  2. Diet: Do not eat solid foods after midnight.  The patient may have clear liquids until 5am upon the day of the procedure.  3. Labs: You will need to have blood drawn on Thursday, February 11 at [place]  Open: 8am - 5pm (Lunch 12:30 - 1:30)   Phone: [phone]. You do not need to be fasting.  COVID19 Test: Friday February 12th @ 2:45 pm This is a Drive Up Visit at the [employer] [address] [place]. Someone will direct you to the appropriate testing line. Stay in your car and someone will be with you shortly.  4. Medication instructions in preparation for your procedure:  Contrast Allergy: No  Stop taking, Cozaar (Losartan) Tuesday, February 16,  Do not take Diabetes Med Glucophage (Metformin) on the day of the procedure and HOLD 48 HOURS AFTER THE PROCEDURE.  On the morning of your procedure, take your Plavix/Clopidogrel and any morning medicines NOT listed above.  You may use sips of  water.  5. Plan for one night stay--bring personal belongings. 6. Bring a current list of your medications and current insurance cards. 7. You MUST have a responsible person to drive you home. 8. Someone MUST be with you the first 24 hours after you arrive home or your discharge will be delayed. 9. Please wear clothes that are easy to get on and off and wear slip-on shoes.  Thank you for allowing [place] to care for you!   -- [facility]

## 2020-02-03 NOTE — Progress Notes (Signed)
LVM for this pt to inform him not to show up for his covid test as he tested + for covid on 12/21/2019. Based on the guidelines he is int he 100 da window to not retest. Pt can still have the scheduled procedure. The appt will be cancelled.

## 2020-02-03 NOTE — H&P (View-Only) (Signed)
Cardiology Office Note   Date:  02/03/2020   ID:  [name], DOB November 15, 1956, MRN [id number]  PCP:  Venia Carbon, MD  Cardiologist:  Dr. Percival Spanish  [id number] Pain    History of Present Illness: [name] is a 64 y.o. male who presents for ongoing assessment and management of CAD, with cardiac cath in 2012 demonstrating LAD a 60-70% stenosis, moderate size second diagonal with 50% stenosis, circumflex AV groove 75-80% stenosis with a branching obtuse marginal with a superior branch subtotal stenosis followed by diffuse disease, right coronary artery had nonobstructive disease. EF was 35%. He had a ICD placed, and at [facility], Dr.Taylor elected not to replace it.   In Jan 2019he had an inferior MI . He underwent urgent DES to to the mid RCA and occluded left circumflex. EF on echo was 40 - 45%. He had some residual disease in the LAD and the plan was for out patient stress testing with PCI  If abnormal. . He had a AAA repair 04/2018.   When last seen the patient was in a motorized wheelchair as he was unable to walk due to back pain and worsening discomfort in his legs.  He is medically compliant, denied any cardiac symptoms.   On 12/21/2019 the patient was seen in the ED with cough and was found to be positive for COVID-19.  He was sent home and to be quarantined with symptomatic treatment with Tylenol for fever reduction.  He was to return if symptoms worsened concerning his breathing status severe diarrhea or excessive pain.  There are no further notes stating that he did return.  [name] comes today with complaints of recurrent chest discomfort on the left radiating down the left arm with associated dyspnea.  The patient states that he has had to take nitroglycerin at least 3-4 times a week for relief.  The pain lasts anywhere from 5 to 10 minutes without associated diaphoresis or nausea.  At first he thought this was related to having had Covid as his energy level has declined.   However he states that this has not gotten better since he is recovered and he has become quite sedentary as a result.  He states that this is awakened him at night sometimes but he also feels it during the day at rest.  Described as an ache and pressure on the left side with some radiation to the left shoulder and arm and some tingling.  This is reminiscent of prior symptoms he had when he was treated for CAD with stent to the RCA and circumflex.  Past Medical History:  Diagnosis Date  . Arthritis   . Automatic implantable cardioverter-defibrillator in situ   . Benign essential HTN 09/17/2016  . COPD (chronic obstructive pulmonary disease) ([place])    emphysema by CXR  . Coronary artery disease     LAD a 60-70% stenosis, moderate size second diagonal with 50% stenosis, circumflex AV groove 75-80% stenosis with a branching obtuse marginal with a superior branch subtotal stenosis followed by diffuse disease, right coronary artery  had nonobstructive disease.  EF was 35%  . Diabetes mellitus    type 2  no meds  . Diverticulosis of colon   . GERD (gastroesophageal reflux disease)    with esophagitis  . History of colonic polyps   . Hypertension   . Non-ischemic cardiomyopathy ([place])   . Presence of permanent cardiac pacemaker   . PVD (peripheral vascular disease) ([place])    bilateral common  iliac artery aneurysms. Right SFA occlusion over a long segment. Left SFA disease with occlusion of the left TP trunk. Artirogram Oct. 2006  . Shortness of breath    exertion  . Sleep apnea   . Tobacco abuse   . Urinary incontinence    detrussor instability    Past Surgical History:  Procedure Laterality Date  . ABDOMINAL AORTIC ENDOVASCULAR STENT GRAFT N/A 05/07/2018   Procedure: ABDOMINAL AORTIC ENDOVASCULAR STENT GRAFT;  Surgeon: Conrad Mentone, MD;  Location: [place];  Service: Vascular;  Laterality: N/A;  . CARDIAC CATHETERIZATION  06/2004   negative  . COLONOSCOPY  04/2005  . COPD exacerbation    .  CORONARY ANGIOPLASTY  01/09/2018  . CORONARY STENT INTERVENTION N/A 01/10/2018   Procedure: CORONARY STENT INTERVENTION;  Surgeon: Lorretta Harp, MD;  Location: [place] [facility];  Service: Cardiovascular;  Laterality: N/A;  . CORONARY/GRAFT ACUTE MI REVASCULARIZATION N/A 01/10/2018   Procedure: Coronary/Graft Acute MI Revascularization;  Surgeon: Lorretta Harp, MD;  Location: [place] [facility];  Service: Cardiovascular;  Laterality: N/A;  . EMBOLIZATION Left 03/19/2018  . EMBOLIZATION Left 03/19/2018   Procedure: EMBOLIZATION - Left Internal;  Surgeon: Conrad [place], MD;  Location: [place] [facility];  Service: Cardiovascular;  Laterality: Left;  . EMBOLIZATION Right 04/16/2018   Procedure: EMBOLIZATION;  Surgeon: Conrad Chouteau, MD;  Location: [place] [facility];  Service: Cardiovascular;  Laterality: Right;  . FEMORAL ARTERY EXPLORATION Right 05/07/2018   Procedure: FEMORAL ARTERY EXPLORATION, EXTENDED PROFUNDAPLASTY;  Surgeon: Conrad Peralta, MD;  Location: [place];  Service: Vascular;  Laterality: Right;  . INTRAOPERATIVE ARTERIOGRAM Right 05/07/2018   Procedure: INTRA OPERATIVE ARTERIOGRAM;  Surgeon: Conrad [place], MD;  Location: [place];  Service: Vascular;  Laterality: Right;  . LEFT HEART CATH AND CORONARY ANGIOGRAPHY N/A 01/10/2018   Procedure: LEFT HEART CATH AND CORONARY ANGIOGRAPHY;  Surgeon: Lorretta Harp, MD;  Location: [place] [facility];  Service: Cardiovascular;  Laterality: N/A;  . PACEMAKER INSERTION  11/2005  . PACEMAKER LEAD REMOVAL N/A 10/17/2014   Procedure: PACEMAKER LEAD REMOVAL;  Surgeon: Evans Lance, MD;  Location: [place];  Service: Cardiovascular;  Laterality: N/A;  . RENAL ANGIOGRAPHY Left 04/16/2018   Procedure: RENAL ANGIOGRAPHY;  Surgeon: Conrad , MD;  Location: [place] [facility];  Service: Cardiovascular;  Laterality: Left;  . s/p ICD placement      Medtronic Maximo [id number] single chamber     Current Outpatient Medications  Medication Sig Dispense  Refill  . atorvastatin (LIPITOR) 80 MG tablet TAKE 1 TABLET BY MOUTH  DAILY AT 6 PM. 90 tablet 0  . bisoprolol (ZEBETA) 10 MG tablet TAKE 1 TABLET BY MOUTH EVERY DAY 90 tablet 1  . carvedilol (COREG) 3.125 MG tablet TAKE 1 TABLET BY MOUTH TWO  TIMES DAILY WITH A MEAL 180 tablet 3  . clopidogrel (PLAVIX) 75 MG tablet TAKE 1 TABLET BY MOUTH EVERY DAY 90 tablet 3  . glipiZIDE (GLUCOTROL) 5 MG tablet Take 1 tablet (5 mg total) by mouth 2 (two) times daily before a meal. 180 tablet 3  . isosorbide mononitrate (IMDUR) 60 MG 24 hr tablet Take 1 tablet (60 mg total) by mouth daily. 90 tablet 3  . losartan (COZAAR) 25 MG tablet TAKE 1 TABLET BY MOUTH  DAILY 90 tablet 0  . metFORMIN (GLUCOPHAGE-XR) 500 MG 24 hr tablet TAKE 2 TABLETS BY MOUTH EVERY DAY WITH BREAKFAST 180 tablet 3  . multivitamin (THERAGRAN)  per tablet Take 1 tablet by mouth daily.     . nitroGLYCERIN (NITROSTAT) 0.4 MG SL tablet Place 1 tablet (0.4 mg total) under the tongue every 5 (five) minutes as needed. 25 tablet 2  . ondansetron (ZOFRAN) 4 MG tablet Take 1 tablet (4 mg total) by mouth every 6 (six) hours as needed for nausea or vomiting. 12 tablet 0  . pantoprazole (PROTONIX) 40 MG tablet Take 1 tablet (40 mg total) by mouth daily. 90 tablet 3  . spironolactone (ALDACTONE) 25 MG tablet TAKE ONE-HALF TABLET BY  MOUTH DAILY 45 tablet 0  . tiotropium (SPIRIVA) 18 MCG inhalation capsule Place 1 capsule (18 mcg total) into inhaler and inhale daily. 30 capsule 11  . VENTOLIN HFA 108 (90 Base) MCG/ACT inhaler TAKE 2 PUFFS BY MOUTH EVERY 6 HOURS AS NEEDED FOR WHEEZE OR SHORTNESS OF BREATH *USE W/ SPACER* 18 Inhaler 1  . vitamin B-12 (CYANOCOBALAMIN) 1000 MCG tablet Take 1,000 mcg by mouth daily.      No current facility-administered medications for this visit.    Allergies:   Patient has no known allergies.    Social History:  The patient  reports that he quit smoking about 2 years ago. His smoking use included cigarettes. He has a 30.00  pack-year smoking history. He has never used smokeless tobacco. He reports that he does not drink alcohol or use drugs.   Family History:  The patient's family history includes Cancer in his brother and sister; Coronary artery disease in his maternal aunt; Heart failure in his maternal aunt; Lung cancer in his maternal aunt and maternal uncle; Stroke in his father.    ROS: All other systems are reviewed and negative. Unless otherwise mentioned in H&P    PHYSICAL EXAM: VS:  BP 118/80   Pulse 68   Ht 5\' 9"  (1.753 m)   Wt 288 lb 9.6 oz (130.9 kg)   SpO2 95%   BMI 42.62 kg/m  , BMI Body mass index is 42.62 kg/m. GEN: Well nourished, well developed, in no acute distress, morbidly obese HEENT: normal Neck: no JVD, carotid bruits, or masses Cardiac: RRR; no murmurs, rubs, or gallops,no edema  Respiratory:  Clear to auscultation bilaterally, normal work of breathing GI: soft, nontender, nondistended, + BS, significant central obesity. MS: no deformity or atrophy Skin: warm and dry, no rash Neuro:  Strength and sensation are intact Psych: euthymic mood, full affect   EKG: Personally reviewed.  Sinus rhythm with first-degree AV block PR interval 222 ms with T wave abnormality noted in the lateral leads with some T wave inversion noted.  Heart rate of 68 bpm  Recent Labs: 12/10/2019: ALT 27; BUN 13; Creatinine, Ser 0.90; Hemoglobin 15.1; Platelets 269.0; Potassium 4.7; Sodium 138    Lipid Panel    Component Value Date/Time   CHOL 125 12/10/2019 1131   CHOL 156 08/25/2018 1154   TRIG 307.0 (H) 12/10/2019 1131   TRIG 575 (HH) 01/02/2007 0000   HDL 37.60 (L) 12/10/2019 1131   HDL 41 08/25/2018 1154   CHOLHDL 3 12/10/2019 1131   VLDL 61.4 (H) 12/10/2019 1131   LDLCALC 35 08/25/2018 1154   LDLDIRECT 26.0 12/10/2019 1131      Wt Readings from Last 3 Encounters:  02/03/20 288 lb 9.6 oz (130.9 kg)  12/10/19 290 lb (131.5 kg)  09/29/19 269 lb (122 kg)      Other studies  Reviewed: [facility] 01/10/2018  Mid RCA lesion is 100% stenosed.  A stent was successfully  placed.  Post intervention, there is a 0% residual stenosis.  Prox Cx to Mid Cx lesion is 100% stenosed.  Ost LAD to Prox LAD lesion is 50% stenosed.  A stent was successfully placed.  Post intervention, there is a 0% residual stenosis.  There is severe left ventricular systolic dysfunction.  LV end diastolic pressure is moderately elevated.  The left ventricular ejection fraction is less than 25% by visual estimate.  NM Stress Test 12/25/2017   Nuclear stress EF: 35%.  There was no ST segment deviation noted during stress.  Defect 1: There is a large defect of moderate severity present in the basal inferior, basal inferolateral, mid inferior, mid inferolateral and apical inferior location.  Findings consistent with prior myocardial infarction with a small amount of peri-infarct ischemia.  This is a high risk study due to reduced systolic function.  The left ventricular ejection fraction is moderately decreased (30-44%).  There was frequent ventricular ectopy which can cause inaccurate calculation of ejection fraction.   Echocardiogram 08/28/2018 Left ventricle: The cavity size was moderately dilated. There was  moderate concentric hypertrophy. Systolic function was mildly to  moderately reduced. The estimated ejection fraction was in the  range of 40% to 45%. Diffuse hypokinesis. Doppler parameters are  consistent with abnormal left ventricular relaxation (grade 1  diastolic dysfunction). Doppler parameters are consistent with  indeterminate ventricular filling pressure.  - Aortic valve: Transvalvular velocity was within the normal range.  There was no stenosis. There was no regurgitation.  - Mitral valve: There was trivial regurgitation.  - Left atrium: The atrium was severely dilated.  - Right ventricle: The cavity size was normal. Wall thickness was  normal.  Systolic function was normal.  - Tricuspid valve: There was trivial regurgitation.  - Pulmonary arteries: Systolic pressure was within the normal  range. PA peak pressure: 21 mm Hg (S).    ASSESSMENT AND PLAN:  1.  Unstable angina: Symptoms reminiscent of prior angina before having cardiac catheterization with intervention.  This may be related to myocarditis versus worsening CAD.  I have discussed this with Dr. Oval Linsey who is DOD onsite today.  We will plan echocardiogram to evaluate further and schedule him for cardiac catheterization for diagnostic purposes or intervention if necessary.  This is scheduled with Dr. Claiborne Billings on 02/08/2020  2.  CAD: History of inferior MI in January 2019 with emergent cardiac catheterization with DES to the mid RCA and occluded left circumflex.  He did have some residual disease in the LAD.  Follow-up stress test was negative for new areas of ischemia.  Planned cardiac catheterization as above due to recurrent symptoms worrisome for unstable angina.  3.  History of COVID-19 infection:: Diagnosed in December 2020.  Has been under quarantine in December, and remains tired and has lack of energy.  In the setting of chest discomfort we will check an echocardiogram to rule out myocarditis.  4.  GERD with esophageal spasm: To follow-up with PCP.  5.  Hypertension: Well-controlled on her current medication regimen  6.  Diabetes type 2: Currently on Metformin 500 mg twice daily.  Will need to hold Metformin following cardiac catheterization to prevent contrast induced nephrotic syndrome.  The patient understands that risks include but are not limited to stroke (1 in 1000), death (1 in 11), kidney failure [usually temporary] (1 in 500), bleeding (1 in 200), allergic reaction [possibly serious] (1 in 200), and agrees to proceed.    Current medicines are reviewed at length with the patient  today.  I have spent 45 minutes dedicated to the care of this patient on the date  of this encounter to include pre-visit review of records, assessment, management and diagnostic testing,with shared decision making.  Labs/ tests ordered today include: Pre-Cath labs.  Phill Myron. West Pugh, ANP, AACC   02/03/2020 12:57 PM    [facility] [address] Office [phone] Fax [phone]  Notice: This dictation was prepared with Dragon dictation along with smaller phrase technology. Any transcriptional errors that result from this process are unintentional and may not be corrected upon review.

## 2020-02-03 NOTE — Progress Notes (Signed)
Cardiology Office Note   Date:  02/03/2020   ID:  [name], DOB 01/05/1956, MRN [id number]  PCP:  Venia Carbon, MD  Cardiologist:  Dr. Percival Spanish  [id number] Pain    History of Present Illness: [name] is a 64 y.o. male who presents for ongoing assessment and management of CAD, with cardiac cath in 2012 demonstrating LAD a 60-70% stenosis, moderate size second diagonal with 50% stenosis, circumflex AV groove 75-80% stenosis with a branching obtuse marginal with a superior branch subtotal stenosis followed by diffuse disease, right coronary artery had nonobstructive disease. EF was 35%. He had a ICD placed, and at [facility], Dr.Taylor elected not to replace it.   In Jan 2019he had an inferior MI . He underwent urgent DES to to the mid RCA and occluded left circumflex. EF on echo was 40 - 45%. He had some residual disease in the LAD and the plan was for out patient stress testing with PCI  If abnormal. . He had a AAA repair 04/2018.   When last seen the patient was in a motorized wheelchair as he was unable to walk due to back pain and worsening discomfort in his legs.  He is medically compliant, denied any cardiac symptoms.   On 12/21/2019 the patient was seen in the ED with cough and was found to be positive for COVID-19.  He was sent home and to be quarantined with symptomatic treatment with Tylenol for fever reduction.  He was to return if symptoms worsened concerning his breathing status severe diarrhea or excessive pain.  There are no further notes stating that he did return.  [name] comes today with complaints of recurrent chest discomfort on the left radiating down the left arm with associated dyspnea.  The patient states that he has had to take nitroglycerin at least 3-4 times a week for relief.  The pain lasts anywhere from 5 to 10 minutes without associated diaphoresis or nausea.  At first he thought this was related to having had Covid as his energy level has declined.   However he states that this has not gotten better since he is recovered and he has become quite sedentary as a result.  He states that this is awakened him at night sometimes but he also feels it during the day at rest.  Described as an ache and pressure on the left side with some radiation to the left shoulder and arm and some tingling.  This is reminiscent of prior symptoms he had when he was treated for CAD with stent to the RCA and circumflex.  Past Medical History:  Diagnosis Date  . Arthritis   . Automatic implantable cardioverter-defibrillator in situ   . Benign essential HTN 09/17/2016  . COPD (chronic obstructive pulmonary disease) ([place])    emphysema by CXR  . Coronary artery disease     LAD a 60-70% stenosis, moderate size second diagonal with 50% stenosis, circumflex AV groove 75-80% stenosis with a branching obtuse marginal with a superior branch subtotal stenosis followed by diffuse disease, right coronary artery  had nonobstructive disease.  EF was 35%  . Diabetes mellitus    type 2  no meds  . Diverticulosis of colon   . GERD (gastroesophageal reflux disease)    with esophagitis  . History of colonic polyps   . Hypertension   . Non-ischemic cardiomyopathy ([place])   . Presence of permanent cardiac pacemaker   . PVD (peripheral vascular disease) ([place])    bilateral common  iliac artery aneurysms. Right SFA occlusion over a long segment. Left SFA disease with occlusion of the left TP trunk. Artirogram Oct. 2006  . Shortness of breath    exertion  . Sleep apnea   . Tobacco abuse   . Urinary incontinence    detrussor instability    Past Surgical History:  Procedure Laterality Date  . ABDOMINAL AORTIC ENDOVASCULAR STENT GRAFT N/A 05/07/2018   Procedure: ABDOMINAL AORTIC ENDOVASCULAR STENT GRAFT;  Surgeon: Conrad Castalia, MD;  Location: [facility];  Service: Vascular;  Laterality: N/A;  . CARDIAC CATHETERIZATION  06/2004   negative  . COLONOSCOPY  04/2005  . COPD exacerbation    .  CORONARY ANGIOPLASTY  01/09/2018  . CORONARY STENT INTERVENTION N/A 01/10/2018   Procedure: CORONARY STENT INTERVENTION;  Surgeon: Lorretta Harp, MD;  Location: [facility];  Service: Cardiovascular;  Laterality: N/A;  . CORONARY/GRAFT ACUTE MI REVASCULARIZATION N/A 01/10/2018   Procedure: Coronary/Graft Acute MI Revascularization;  Surgeon: Lorretta Harp, MD;  Location: [facility];  Service: Cardiovascular;  Laterality: N/A;  . EMBOLIZATION Left 03/19/2018  . EMBOLIZATION Left 03/19/2018   Procedure: EMBOLIZATION - Left Internal;  Surgeon: Conrad Toco, MD;  Location: [facility];  Service: Cardiovascular;  Laterality: Left;  . EMBOLIZATION Right 04/16/2018   Procedure: EMBOLIZATION;  Surgeon: Conrad Amity, MD;  Location: [facility];  Service: Cardiovascular;  Laterality: Right;  . FEMORAL ARTERY EXPLORATION Right 05/07/2018   Procedure: FEMORAL ARTERY EXPLORATION, EXTENDED PROFUNDAPLASTY;  Surgeon: Conrad Castaic, MD;  Location: [facility];  Service: Vascular;  Laterality: Right;  . INTRAOPERATIVE ARTERIOGRAM Right 05/07/2018   Procedure: INTRA OPERATIVE ARTERIOGRAM;  Surgeon: Conrad Otter Lake, MD;  Location: [facility];  Service: Vascular;  Laterality: Right;  . LEFT HEART CATH AND CORONARY ANGIOGRAPHY N/A 01/10/2018   Procedure: LEFT HEART CATH AND CORONARY ANGIOGRAPHY;  Surgeon: Lorretta Harp, MD;  Location: [facility];  Service: Cardiovascular;  Laterality: N/A;  . PACEMAKER INSERTION  11/2005  . PACEMAKER LEAD REMOVAL N/A 10/17/2014   Procedure: PACEMAKER LEAD REMOVAL;  Surgeon: Evans Lance, MD;  Location: [facility];  Service: Cardiovascular;  Laterality: N/A;  . RENAL ANGIOGRAPHY Left 04/16/2018   Procedure: RENAL ANGIOGRAPHY;  Surgeon: Conrad , MD;  Location: [facility];  Service: Cardiovascular;  Laterality: Left;  . s/p ICD placement      Medtronic Maximo [phone] single chamber     Current Outpatient Medications  Medication Sig Dispense  Refill  . atorvastatin (LIPITOR) 80 MG tablet TAKE 1 TABLET BY MOUTH  DAILY AT 6 PM. 90 tablet 0  . bisoprolol (ZEBETA) 10 MG tablet TAKE 1 TABLET BY MOUTH EVERY DAY 90 tablet 1  . carvedilol (COREG) 3.125 MG tablet TAKE 1 TABLET BY MOUTH TWO  TIMES DAILY WITH A MEAL 180 tablet 3  . clopidogrel (PLAVIX) 75 MG tablet TAKE 1 TABLET BY MOUTH EVERY DAY 90 tablet 3  . glipiZIDE (GLUCOTROL) 5 MG tablet Take 1 tablet (5 mg total) by mouth 2 (two) times daily before a meal. 180 tablet 3  . isosorbide mononitrate (IMDUR) 60 MG 24 hr tablet Take 1 tablet (60 mg total) by mouth daily. 90 tablet 3  . losartan (COZAAR) 25 MG tablet TAKE 1 TABLET BY MOUTH  DAILY 90 tablet 0  . metFORMIN (GLUCOPHAGE-XR) 500 MG 24 hr tablet TAKE 2 TABLETS BY MOUTH EVERY DAY WITH BREAKFAST 180 tablet 3  . multivitamin (THERAGRAN)  per tablet Take 1 tablet by mouth daily.     . nitroGLYCERIN (NITROSTAT) 0.4 MG SL tablet Place 1 tablet (0.4 mg total) under the tongue every 5 (five) minutes as needed. 25 tablet 2  . ondansetron (ZOFRAN) 4 MG tablet Take 1 tablet (4 mg total) by mouth every 6 (six) hours as needed for nausea or vomiting. 12 tablet 0  . pantoprazole (PROTONIX) 40 MG tablet Take 1 tablet (40 mg total) by mouth daily. 90 tablet 3  . spironolactone (ALDACTONE) 25 MG tablet TAKE ONE-HALF TABLET BY  MOUTH DAILY 45 tablet 0  . tiotropium (SPIRIVA) 18 MCG inhalation capsule Place 1 capsule (18 mcg total) into inhaler and inhale daily. 30 capsule 11  . VENTOLIN HFA 108 (90 Base) MCG/ACT inhaler TAKE 2 PUFFS BY MOUTH EVERY 6 HOURS AS NEEDED FOR WHEEZE OR SHORTNESS OF BREATH *USE W/ SPACER* 18 Inhaler 1  . vitamin B-12 (CYANOCOBALAMIN) 1000 MCG tablet Take 1,000 mcg by mouth daily.      No current facility-administered medications for this visit.    Allergies:   Patient has no known allergies.    Social History:  The patient  reports that he quit smoking about 2 years ago. His smoking use included cigarettes. He has a 30.00  pack-year smoking history. He has never used smokeless tobacco. He reports that he does not drink alcohol or use drugs.   Family History:  The patient's family history includes Cancer in his brother and sister; Coronary artery disease in his maternal aunt; Heart failure in his maternal aunt; Lung cancer in his maternal aunt and maternal uncle; Stroke in his father.    ROS: All other systems are reviewed and negative. Unless otherwise mentioned in H&P    PHYSICAL EXAM: VS:  BP 118/80   Pulse 68   Ht 5\' 9"  (1.753 m)   Wt 288 lb 9.6 oz (130.9 kg)   SpO2 95%   BMI 42.62 kg/m  , BMI Body mass index is 42.62 kg/m. GEN: Well nourished, well developed, in no acute distress, morbidly obese HEENT: normal Neck: no JVD, carotid bruits, or masses Cardiac: RRR; no murmurs, rubs, or gallops,no edema  Respiratory:  Clear to auscultation bilaterally, normal work of breathing GI: soft, nontender, nondistended, + BS, significant central obesity. MS: no deformity or atrophy Skin: warm and dry, no rash Neuro:  Strength and sensation are intact Psych: euthymic mood, full affect   EKG: Personally reviewed.  Sinus rhythm with first-degree AV block PR interval 222 ms with T wave abnormality noted in the lateral leads with some T wave inversion noted.  Heart rate of 68 bpm  Recent Labs: 12/10/2019: ALT 27; BUN 13; Creatinine, Ser 0.90; Hemoglobin 15.1; Platelets 269.0; Potassium 4.7; Sodium 138    Lipid Panel    Component Value Date/Time   CHOL 125 12/10/2019 1131   CHOL 156 08/25/2018 1154   TRIG 307.0 (H) 12/10/2019 1131   TRIG 575 (HH) 01/02/2007 0000   HDL 37.60 (L) 12/10/2019 1131   HDL 41 08/25/2018 1154   CHOLHDL 3 12/10/2019 1131   VLDL 61.4 (H) 12/10/2019 1131   LDLCALC 35 08/25/2018 1154   LDLDIRECT 26.0 12/10/2019 1131      Wt Readings from Last 3 Encounters:  02/03/20 288 lb 9.6 oz (130.9 kg)  12/10/19 290 lb (131.5 kg)  09/29/19 269 lb (122 kg)      Other studies  Reviewed: LHC 01/10/2018  Mid RCA lesion is 100% stenosed.  A stent was successfully  placed.  Post intervention, there is a 0% residual stenosis.  Prox Cx to Mid Cx lesion is 100% stenosed.  Ost LAD to Prox LAD lesion is 50% stenosed.  A stent was successfully placed.  Post intervention, there is a 0% residual stenosis.  There is severe left ventricular systolic dysfunction.  LV end diastolic pressure is moderately elevated.  The left ventricular ejection fraction is less than 25% by visual estimate.  NM Stress Test 12/25/2017   Nuclear stress EF: 35%.  There was no ST segment deviation noted during stress.  Defect 1: There is a large defect of moderate severity present in the basal inferior, basal inferolateral, mid inferior, mid inferolateral and apical inferior location.  Findings consistent with prior myocardial infarction with a small amount of peri-infarct ischemia.  This is a high risk study due to reduced systolic function.  The left ventricular ejection fraction is moderately decreased (30-44%).  There was frequent ventricular ectopy which can cause inaccurate calculation of ejection fraction.   Echocardiogram 08/28/2018 Left ventricle: The cavity size was moderately dilated. There was  moderate concentric hypertrophy. Systolic function was mildly to  moderately reduced. The estimated ejection fraction was in the  range of 40% to 45%. Diffuse hypokinesis. Doppler parameters are  consistent with abnormal left ventricular relaxation (grade 1  diastolic dysfunction). Doppler parameters are consistent with  indeterminate ventricular filling pressure.  - Aortic valve: Transvalvular velocity was within the normal range.  There was no stenosis. There was no regurgitation.  - Mitral valve: There was trivial regurgitation.  - Left atrium: The atrium was severely dilated.  - Right ventricle: The cavity size was normal. Wall thickness was  normal.  Systolic function was normal.  - Tricuspid valve: There was trivial regurgitation.  - Pulmonary arteries: Systolic pressure was within the normal  range. PA peak pressure: 21 mm Hg (S).    ASSESSMENT AND PLAN:  1.  Unstable angina: Symptoms reminiscent of prior angina before having cardiac catheterization with intervention.  This may be related to myocarditis versus worsening CAD.  I have discussed this with Dr. Oval Linsey who is DOD onsite today.  We will plan echocardiogram to evaluate further and schedule him for cardiac catheterization for diagnostic purposes or intervention if necessary.  This is scheduled with Dr. Claiborne Billings on 02/08/2020  2.  CAD: History of inferior MI in January 2019 with emergent cardiac catheterization with DES to the mid RCA and occluded left circumflex.  He did have some residual disease in the LAD.  Follow-up stress test was negative for new areas of ischemia.  Planned cardiac catheterization as above due to recurrent symptoms worrisome for unstable angina.  3.  History of COVID-19 infection:: Diagnosed in December 2020.  Has been under quarantine in December, and remains tired and has lack of energy.  In the setting of chest discomfort we will check an echocardiogram to rule out myocarditis.  4.  GERD with esophageal spasm: To follow-up with PCP.  5.  Hypertension: Well-controlled on her current medication regimen  6.  Diabetes type 2: Currently on Metformin 500 mg twice daily.  Will need to hold Metformin following cardiac catheterization to prevent contrast induced nephrotic syndrome.  The patient understands that risks include but are not limited to stroke (1 in 1000), death (1 in 19), kidney failure [usually temporary] (1 in 500), bleeding (1 in 200), allergic reaction [possibly serious] (1 in 200), and agrees to proceed.    Current medicines are reviewed at length with the patient  today.  I have spent 45 minutes dedicated to the care of this patient on the date  of this encounter to include pre-visit review of records, assessment, management and diagnostic testing,with shared decision making.  Labs/ tests ordered today include: Pre-Cath labs.  Phill Myron. West Pugh, ANP, AACC   02/03/2020 12:57 PM    [facility], [facility] [place] [address] Office [phone] Fax [phone]  Notice: This dictation was prepared with Dragon dictation along with smaller phrase technology. Any transcriptional errors that result from this process are unintentional and may not be corrected upon review.

## 2020-02-04 ENCOUNTER — Inpatient Hospital Stay (HOSPITAL_COMMUNITY)
Admission: RE | Admit: 2020-02-04 | Discharge: 2020-02-04 | Disposition: A | Discharge status: Home / Self Care | Payer: BC Managed Care – PPO | Source: Ambulatory Visit

## 2020-02-04 LAB — CBC
Hematocrit: 42.3 % (ref 37.5–51.0)
Hemoglobin: 14.4 g/dL (ref 13.0–17.7)
MCH: 28.7 pg (ref 26.6–33.0)
MCHC: 34 g/dL (ref 31.5–35.7)
MCV: 84 fL (ref 79–97)
Platelets: 351 10*3/uL (ref 150–450)
RBC: 5.02 x10E6/uL (ref 4.14–5.80)
RDW: 14.8 % (ref 11.6–15.4)
WBC: 9.1 10*3/uL (ref 3.4–10.8)

## 2020-02-04 LAB — BASIC METABOLIC PANEL
BUN/Creatinine Ratio: 14 (ref 10–24)
BUN: 12 mg/dL (ref 8–27)
CO2: 21 mmol/L (ref 20–29)
Calcium: 9.4 mg/dL (ref 8.6–10.2)
Chloride: 97 mmol/L (ref 96–106)
Creatinine, Ser: 0.87 mg/dL (ref 0.76–1.27)
GFR calc Af Amer: 106 mL/min/{1.73_m2} (ref >59)
GFR calc non Af Amer: 92 mL/min/{1.73_m2} (ref >59)
Glucose: 223 mg/dL — ABNORMAL HIGH (ref 65–99)
Potassium: 5.2 mmol/L (ref 3.5–5.2)
Sodium: 135 mmol/L (ref 134–144)

## 2020-02-07 ENCOUNTER — Telehealth: Payer: Self-pay | Admitting: *Deleted

## 2020-02-07 ENCOUNTER — Telehealth: Payer: Self-pay | Admitting: Adult Health

## 2020-02-07 NOTE — Telephone Encounter (Signed)
[place];   Pt is having a procedure at the hospital tomorrow. She have a question about his medicine please.

## 2020-02-07 NOTE — Telephone Encounter (Signed)
Spoke with patient's wife. Wife wanted to clarify whether to hold Glipizide the morning of the procedure or not. Per instructions patient to hold Glipizide on morning of procedure. Patient's spouse verbalized understanding.

## 2020-02-07 NOTE — Telephone Encounter (Signed)
Pt contacted pre-catheterization scheduled at [facility] for: Tuesday February 08, 2020 12 Noon Verified arrival time and place: [facility]) at: 10 AM   No solid food after midnight prior to cath, clear liquids until 5 AM day of procedure. Contrast allergy: no  Hold: Metformin-day of procedure and 48 hours post procedure. Glipizide-AM of procedure. Spironolactone-AM of procedure.  Except hold medications AM meds can be  taken pre-cath with sip of water including: ASA 81 mg Plavix 75 mg  Confirmed patient has responsible adult to drive home post procedure and observe 24 hours after arriving home: yes  Currently, due to Covid-19 pandemic, only one person will be allowed with patient. Must be the same person for patient's entire stay and will be required to wear a mask. They will be asked to wait in the waiting room for the duration of the patient's stay.  Patients are required to wear a mask when they enter the hospital.      COVID-19 Pre-Screening Questions:  . In the past 7 to 10 days have you had a cough,  shortness of breath, headache, congestion, fever (100 or greater) body aches, chills, sore throat, or sudden loss of taste or sense of smell? no . Have you been around anyone with known Covid 19 in past 7-10 days? no . Have you been around anyone who is awaiting Covid 19 test results in the past 7 to 10 days? no . Have you been around anyone who has been exposed to Covid 19, or has mentioned symptoms of Covid 19 within the past 7 to 10 days? no   I reviewed procedure/mask/visitor instructions, COVID-19 screening questions with patient, he verbalized understanding, thanked me for call.  Pt COVID-19 + 12/21/19-pt states symptoms at time of COVID-19 have resolved.

## 2020-02-08 ENCOUNTER — Inpatient Hospital Stay (HOSPITAL_COMMUNITY)
Admission: RE | Admit: 2020-02-08 | Discharge: 2020-02-18 | DRG: 234 | Disposition: A | Discharge status: Home / Self Care | Payer: BC Managed Care – PPO | Attending: Cardiothoracic Surgery | Admitting: Cardiothoracic Surgery

## 2020-02-08 ENCOUNTER — Encounter (HOSPITAL_COMMUNITY)
Admission: RE | Disposition: A | Discharge status: Home / Self Care | Payer: Self-pay | Source: Home / Self Care | Attending: Cardiothoracic Surgery

## 2020-02-08 ENCOUNTER — Inpatient Hospital Stay (HOSPITAL_COMMUNITY): Payer: BC Managed Care – PPO

## 2020-02-08 ENCOUNTER — Encounter (HOSPITAL_COMMUNITY): Payer: Self-pay | Admitting: Cardiovascular Disease

## 2020-02-08 ENCOUNTER — Other Ambulatory Visit: Payer: Self-pay | Admitting: *Deleted

## 2020-02-08 ENCOUNTER — Other Ambulatory Visit: Payer: Self-pay

## 2020-02-08 DIAGNOSIS — Z951 Presence of aortocoronary bypass graft: Secondary | ICD-10-CM | POA: Diagnosis not present

## 2020-02-08 DIAGNOSIS — I4819 Other persistent atrial fibrillation: Secondary | ICD-10-CM | POA: Diagnosis present

## 2020-02-08 DIAGNOSIS — K5730 Diverticulosis of large intestine without perforation or abscess without bleeding: Secondary | ICD-10-CM | POA: Diagnosis present

## 2020-02-08 DIAGNOSIS — E785 Hyperlipidemia, unspecified: Secondary | ICD-10-CM | POA: Diagnosis not present

## 2020-02-08 DIAGNOSIS — Z87891 Personal history of nicotine dependence: Secondary | ICD-10-CM

## 2020-02-08 DIAGNOSIS — M549 Dorsalgia, unspecified: Secondary | ICD-10-CM | POA: Diagnosis present

## 2020-02-08 DIAGNOSIS — K219 Gastro-esophageal reflux disease without esophagitis: Secondary | ICD-10-CM | POA: Diagnosis not present

## 2020-02-08 DIAGNOSIS — J90 Pleural effusion, not elsewhere classified: Secondary | ICD-10-CM | POA: Diagnosis not present

## 2020-02-08 DIAGNOSIS — R262 Difficulty in walking, not elsewhere classified: Secondary | ICD-10-CM | POA: Diagnosis present

## 2020-02-08 DIAGNOSIS — I252 Old myocardial infarction: Secondary | ICD-10-CM

## 2020-02-08 DIAGNOSIS — Z7984 Long term (current) use of oral hypoglycemic drugs: Secondary | ICD-10-CM

## 2020-02-08 DIAGNOSIS — D62 Acute posthemorrhagic anemia: Secondary | ICD-10-CM | POA: Diagnosis not present

## 2020-02-08 DIAGNOSIS — Z955 Presence of coronary angioplasty implant and graft: Secondary | ICD-10-CM

## 2020-02-08 DIAGNOSIS — I25110 Atherosclerotic heart disease of native coronary artery with unstable angina pectoris: Secondary | ICD-10-CM

## 2020-02-08 DIAGNOSIS — Z7902 Long term (current) use of antithrombotics/antiplatelets: Secondary | ICD-10-CM

## 2020-02-08 DIAGNOSIS — K224 Dyskinesia of esophagus: Secondary | ICD-10-CM | POA: Diagnosis not present

## 2020-02-08 DIAGNOSIS — E782 Mixed hyperlipidemia: Secondary | ICD-10-CM | POA: Diagnosis not present

## 2020-02-08 DIAGNOSIS — Z4682 Encounter for fitting and adjustment of non-vascular catheter: Secondary | ICD-10-CM | POA: Diagnosis not present

## 2020-02-08 DIAGNOSIS — I110 Hypertensive heart disease with heart failure: Secondary | ICD-10-CM | POA: Diagnosis present

## 2020-02-08 DIAGNOSIS — J439 Emphysema, unspecified: Secondary | ICD-10-CM | POA: Diagnosis present

## 2020-02-08 DIAGNOSIS — I428 Other cardiomyopathies: Secondary | ICD-10-CM | POA: Diagnosis present

## 2020-02-08 DIAGNOSIS — Z9889 Other specified postprocedural states: Secondary | ICD-10-CM

## 2020-02-08 DIAGNOSIS — R0602 Shortness of breath: Secondary | ICD-10-CM

## 2020-02-08 DIAGNOSIS — I70201 Unspecified atherosclerosis of native arteries of extremities, right leg: Secondary | ICD-10-CM | POA: Diagnosis present

## 2020-02-08 DIAGNOSIS — J9811 Atelectasis: Secondary | ICD-10-CM | POA: Diagnosis not present

## 2020-02-08 DIAGNOSIS — Z4659 Encounter for fitting and adjustment of other gastrointestinal appliance and device: Secondary | ICD-10-CM

## 2020-02-08 DIAGNOSIS — I25810 Atherosclerosis of coronary artery bypass graft(s) without angina pectoris: Secondary | ICD-10-CM | POA: Diagnosis not present

## 2020-02-08 DIAGNOSIS — E1159 Type 2 diabetes mellitus with other circulatory complications: Secondary | ICD-10-CM | POA: Diagnosis not present

## 2020-02-08 DIAGNOSIS — U071 COVID-19: Secondary | ICD-10-CM | POA: Diagnosis not present

## 2020-02-08 DIAGNOSIS — I454 Nonspecific intraventricular block: Secondary | ICD-10-CM | POA: Diagnosis present

## 2020-02-08 DIAGNOSIS — I340 Nonrheumatic mitral (valve) insufficiency: Secondary | ICD-10-CM | POA: Diagnosis not present

## 2020-02-08 DIAGNOSIS — Z6841 Body Mass Index (BMI) 40.0 and over, adult: Secondary | ICD-10-CM

## 2020-02-08 DIAGNOSIS — Z8719 Personal history of other diseases of the digestive system: Secondary | ICD-10-CM

## 2020-02-08 DIAGNOSIS — Z8616 Personal history of COVID-19: Secondary | ICD-10-CM | POA: Diagnosis not present

## 2020-02-08 DIAGNOSIS — G4733 Obstructive sleep apnea (adult) (pediatric): Secondary | ICD-10-CM | POA: Diagnosis present

## 2020-02-08 DIAGNOSIS — I10 Essential (primary) hypertension: Secondary | ICD-10-CM | POA: Diagnosis not present

## 2020-02-08 DIAGNOSIS — Z7982 Long term (current) use of aspirin: Secondary | ICD-10-CM | POA: Diagnosis not present

## 2020-02-08 DIAGNOSIS — Z01818 Encounter for other preprocedural examination: Secondary | ICD-10-CM | POA: Diagnosis not present

## 2020-02-08 DIAGNOSIS — R918 Other nonspecific abnormal finding of lung field: Secondary | ICD-10-CM | POA: Diagnosis not present

## 2020-02-08 DIAGNOSIS — Z79899 Other long term (current) drug therapy: Secondary | ICD-10-CM

## 2020-02-08 DIAGNOSIS — I5022 Chronic systolic (congestive) heart failure: Secondary | ICD-10-CM | POA: Diagnosis not present

## 2020-02-08 DIAGNOSIS — I480 Paroxysmal atrial fibrillation: Secondary | ICD-10-CM | POA: Diagnosis not present

## 2020-02-08 DIAGNOSIS — Z8679 Personal history of other diseases of the circulatory system: Secondary | ICD-10-CM

## 2020-02-08 DIAGNOSIS — Z95810 Presence of automatic (implantable) cardiac defibrillator: Secondary | ICD-10-CM

## 2020-02-08 DIAGNOSIS — E1151 Type 2 diabetes mellitus with diabetic peripheral angiopathy without gangrene: Secondary | ICD-10-CM | POA: Diagnosis not present

## 2020-02-08 DIAGNOSIS — I25119 Atherosclerotic heart disease of native coronary artery with unspecified angina pectoris: Secondary | ICD-10-CM | POA: Diagnosis not present

## 2020-02-08 DIAGNOSIS — Z01810 Encounter for preprocedural cardiovascular examination: Secondary | ICD-10-CM | POA: Diagnosis not present

## 2020-02-08 DIAGNOSIS — I2510 Atherosclerotic heart disease of native coronary artery without angina pectoris: Secondary | ICD-10-CM

## 2020-02-08 HISTORY — PX: LEFT HEART CATH AND CORONARY ANGIOGRAPHY: CATH118249

## 2020-02-08 HISTORY — PX: CARDIAC CATHETERIZATION: SHX172

## 2020-02-08 LAB — GLUCOSE, CAPILLARY
Glucose-Capillary: 180 mg/dL — ABNORMAL HIGH (ref 70–99)
Glucose-Capillary: 125 mg/dL — ABNORMAL HIGH (ref 70–99)
Glucose-Capillary: 192 mg/dL — ABNORMAL HIGH (ref 70–99)
Glucose-Capillary: 150 mg/dL — ABNORMAL HIGH (ref 70–99)

## 2020-02-08 LAB — ECHOCARDIOGRAM COMPLETE
Height: 69 in
Weight: 4608 oz

## 2020-02-08 SURGERY — LEFT HEART CATH AND CORONARY ANGIOGRAPHY
Anesthesia: LOCAL

## 2020-02-08 MED ORDER — LIDOCAINE HCL (PF) 1 % IJ SOLN
INTRAMUSCULAR | Status: DC | PRN
  Administered 2020-02-08: 2 mL

## 2020-02-08 MED ORDER — BISOPROLOL FUMARATE 5 MG PO TABS
10.0000 mg | ORAL_TABLET | Freq: Every day | ORAL | Status: DC
  Administered 2020-02-09 – 2020-02-10 (×2): 10 mg via ORAL
  Filled 2020-02-08 (×2): qty 2

## 2020-02-08 MED ORDER — VERAPAMIL HCL 2.5 MG/ML IV SOLN
INTRAVENOUS | Status: DC | PRN
  Administered 2020-02-08: 10 mL via INTRA_ARTERIAL

## 2020-02-08 MED ORDER — FENTANYL CITRATE (PF) 100 MCG/2ML IJ SOLN
INTRAMUSCULAR | Status: DC | PRN
  Administered 2020-02-08: 50 ug via INTRAVENOUS

## 2020-02-08 MED ORDER — LIDOCAINE HCL (PF) 1 % IJ SOLN
INTRAMUSCULAR | Status: AC
  Filled 2020-02-08: qty 30

## 2020-02-08 MED ORDER — HEPARIN SODIUM (PORCINE) 1000 UNIT/ML IJ SOLN
INTRAMUSCULAR | Status: AC
  Filled 2020-02-08: qty 1

## 2020-02-08 MED ORDER — SODIUM CHLORIDE 0.9% FLUSH: 3.0000 mL | Freq: Two times a day (BID) | INTRAVENOUS | Status: DC

## 2020-02-08 MED ORDER — PANTOPRAZOLE SODIUM 40 MG PO TBEC
40.0000 mg | DELAYED_RELEASE_TABLET | Freq: Every day | ORAL | Status: DC
  Administered 2020-02-08 – 2020-02-10 (×3): 40 mg via ORAL
  Filled 2020-02-08 (×3): qty 1

## 2020-02-08 MED ORDER — HEPARIN (PORCINE) 25000 UT/250ML-% IV SOLN
1800.0000 [IU]/h | INTRAVENOUS | Status: DC
  Administered 2020-02-08: 1300 [IU]/h via INTRAVENOUS
  Administered 2020-02-09 – 2020-02-10 (×2): 1800 [IU]/h via INTRAVENOUS
  Filled 2020-02-08 (×4): qty 250

## 2020-02-08 MED ORDER — SODIUM CHLORIDE 0.9 % IV SOLN: 250.0000 mL | INTRAVENOUS | Status: DC | PRN

## 2020-02-08 MED ORDER — TIOTROPIUM BROMIDE MONOHYDRATE 18 MCG IN CAPS: 18.0000 ug | ORAL_CAPSULE | Freq: Every day | RESPIRATORY_TRACT | Status: DC

## 2020-02-08 MED ORDER — MIDAZOLAM HCL 2 MG/2ML IJ SOLN
INTRAMUSCULAR | Status: AC
  Filled 2020-02-08: qty 2

## 2020-02-08 MED ORDER — ATORVASTATIN CALCIUM 80 MG PO TABS
80.0000 mg | ORAL_TABLET | Freq: Every day | ORAL | Status: DC
  Administered 2020-02-08 – 2020-02-17 (×9): 80 mg via ORAL
  Filled 2020-02-08 (×9): qty 1

## 2020-02-08 MED ORDER — FENTANYL CITRATE (PF) 100 MCG/2ML IJ SOLN
INTRAMUSCULAR | Status: AC
  Filled 2020-02-08: qty 2

## 2020-02-08 MED ORDER — ATORVASTATIN CALCIUM 80 MG PO TABS: 80.0000 mg | ORAL_TABLET | Freq: Every day | ORAL | Status: DC

## 2020-02-08 MED ORDER — UMECLIDINIUM BROMIDE 62.5 MCG/INH IN AEPB
1.0000 | INHALATION_SPRAY | Freq: Every day | RESPIRATORY_TRACT | Status: DC
  Administered 2020-02-09 – 2020-02-10 (×2): 1 via RESPIRATORY_TRACT
  Filled 2020-02-08: qty 7

## 2020-02-08 MED ORDER — DIAZEPAM 5 MG PO TABS: 5.0000 mg | ORAL_TABLET | ORAL | Status: DC | PRN

## 2020-02-08 MED ORDER — HEPARIN (PORCINE) IN NACL 1000-0.9 UT/500ML-% IV SOLN
INTRAVENOUS | Status: AC
  Filled 2020-02-08: qty 1000

## 2020-02-08 MED ORDER — SODIUM CHLORIDE 0.9% FLUSH: 3.0000 mL | INTRAVENOUS | Status: DC | PRN

## 2020-02-08 MED ORDER — ALBUTEROL SULFATE HFA 108 (90 BASE) MCG/ACT IN AERS: 2.0000 | INHALATION_SPRAY | Freq: Four times a day (QID) | RESPIRATORY_TRACT | Status: DC | PRN

## 2020-02-08 MED ORDER — ACETAMINOPHEN 325 MG PO TABS: 650.0000 mg | ORAL_TABLET | ORAL | Status: DC | PRN

## 2020-02-08 MED ORDER — HEPARIN (PORCINE) IN NACL 1000-0.9 UT/500ML-% IV SOLN
INTRAVENOUS | Status: DC | PRN
  Administered 2020-02-08: 500 mL

## 2020-02-08 MED ORDER — ADULT MULTIVITAMIN W/MINERALS CH
1.0000 | ORAL_TABLET | Freq: Every day | ORAL | Status: DC
  Administered 2020-02-09 – 2020-02-10 (×2): 1 via ORAL
  Filled 2020-02-08 (×2): qty 1

## 2020-02-08 MED ORDER — GLIPIZIDE 10 MG PO TABS
5.0000 mg | ORAL_TABLET | Freq: Two times a day (BID) | ORAL | Status: DC
  Administered 2020-02-08 – 2020-02-10 (×5): 5 mg via ORAL
  Filled 2020-02-08 (×5): qty 1

## 2020-02-08 MED ORDER — SODIUM CHLORIDE 0.9 % WEIGHT BASED INFUSION
3.0000 mL/kg/h | INTRAVENOUS | Status: DC
  Administered 2020-02-08: 3 mL/kg/h via INTRAVENOUS

## 2020-02-08 MED ORDER — SPIRONOLACTONE 12.5 MG HALF TABLET
12.5000 mg | ORAL_TABLET | Freq: Every day | ORAL | Status: DC
  Administered 2020-02-09 – 2020-02-10 (×2): 12.5 mg via ORAL
  Filled 2020-02-08 (×3): qty 1

## 2020-02-08 MED ORDER — SODIUM CHLORIDE 0.9% FLUSH
3.0000 mL | Freq: Two times a day (BID) | INTRAVENOUS | Status: DC
  Administered 2020-02-08 – 2020-02-09 (×2): 3 mL via INTRAVENOUS

## 2020-02-08 MED ORDER — MIDAZOLAM HCL 2 MG/2ML IJ SOLN
INTRAMUSCULAR | Status: DC | PRN
  Administered 2020-02-08: 2 mg via INTRAVENOUS

## 2020-02-08 MED ORDER — ISOSORBIDE MONONITRATE ER 60 MG PO TB24: 60.0000 mg | ORAL_TABLET | Freq: Every day | ORAL | Status: DC

## 2020-02-08 MED ORDER — IOHEXOL 350 MG/ML SOLN
INTRAVENOUS | Status: DC | PRN
  Administered 2020-02-08: 85 mL via INTRA_ARTERIAL

## 2020-02-08 MED ORDER — NITROGLYCERIN 0.4 MG SL SUBL: 0.4000 mg | SUBLINGUAL_TABLET | SUBLINGUAL | Status: DC | PRN

## 2020-02-08 MED ORDER — SODIUM CHLORIDE 0.9 % IV SOLN: INTRAVENOUS | Status: DC

## 2020-02-08 MED ORDER — ASPIRIN 81 MG PO CHEW
81.0000 mg | CHEWABLE_TABLET | Freq: Every day | ORAL | Status: DC
  Administered 2020-02-09 – 2020-02-10 (×2): 81 mg via ORAL
  Filled 2020-02-08 (×3): qty 1

## 2020-02-08 MED ORDER — SODIUM CHLORIDE 0.9 % WEIGHT BASED INFUSION: 1.0000 mL/kg/h | INTRAVENOUS | Status: DC

## 2020-02-08 MED ORDER — ALBUTEROL SULFATE (2.5 MG/3ML) 0.083% IN NEBU: 2.5000 mg | INHALATION_SOLUTION | Freq: Four times a day (QID) | RESPIRATORY_TRACT | Status: DC | PRN

## 2020-02-08 MED ORDER — ISOSORBIDE MONONITRATE ER 30 MG PO TB24
30.0000 mg | ORAL_TABLET | Freq: Every day | ORAL | Status: DC
  Administered 2020-02-08 – 2020-02-10 (×3): 30 mg via ORAL
  Filled 2020-02-08 (×3): qty 1

## 2020-02-08 MED ORDER — LABETALOL HCL 5 MG/ML IV SOLN: 10.0000 mg | INTRAVENOUS | Status: AC | PRN

## 2020-02-08 MED ORDER — HYDRALAZINE HCL 20 MG/ML IJ SOLN: 10.0000 mg | INTRAMUSCULAR | Status: AC | PRN

## 2020-02-08 MED ORDER — ASPIRIN 81 MG PO CHEW
81.0000 mg | CHEWABLE_TABLET | ORAL | Status: AC
  Administered 2020-02-08: 81 mg via ORAL

## 2020-02-08 MED ORDER — HEPARIN SODIUM (PORCINE) 1000 UNIT/ML IJ SOLN
INTRAMUSCULAR | Status: DC | PRN
  Administered 2020-02-08: 6500 [IU] via INTRAVENOUS

## 2020-02-08 MED ORDER — VERAPAMIL HCL 2.5 MG/ML IV SOLN
INTRAVENOUS | Status: AC
  Filled 2020-02-08: qty 2

## 2020-02-08 MED ORDER — VITAMIN B-12 1000 MCG PO TABS
1000.0000 ug | ORAL_TABLET | Freq: Every day | ORAL | Status: DC
  Administered 2020-02-09 – 2020-02-10 (×2): 1000 ug via ORAL
  Filled 2020-02-08 (×2): qty 1

## 2020-02-08 MED ORDER — ONDANSETRON HCL 4 MG/2ML IJ SOLN: 4.0000 mg | Freq: Four times a day (QID) | INTRAMUSCULAR | Status: DC | PRN

## 2020-02-08 MED ORDER — ACETAMINOPHEN 500 MG PO TABS: 1000.0000 mg | ORAL_TABLET | Freq: Four times a day (QID) | ORAL | Status: DC | PRN

## 2020-02-08 SURGICAL SUPPLY — 10 items
CATH INFINITI 5FR ANG PIGTAIL (CATHETERS) ×2 IMPLANT
CATH OPTITORQUE TIG 4.0 5F (CATHETERS) ×2 IMPLANT
DEVICE RAD COMP TR BAND LRG (VASCULAR PRODUCTS) ×2 IMPLANT
GLIDESHEATH SLEND SS 6F .021 (SHEATH) ×2 IMPLANT
GUIDEWIRE INQWIRE 1.5J.035X260 (WIRE) ×1 IMPLANT
INQWIRE 1.5J .035X260CM (WIRE) ×2
KIT HEART LEFT (KITS) ×2 IMPLANT
PACK CARDIAC CATHETERIZATION (CUSTOM PROCEDURE TRAY) ×2 IMPLANT
TRANSDUCER W/STOPCOCK (MISCELLANEOUS) ×2 IMPLANT
TUBING CIL FLEX 10 FLL-RA (TUBING) ×2 IMPLANT

## 2020-02-08 NOTE — Interval H&P Note (Signed)
Cath Lab Visit (complete for each Cath Lab visit)  Clinical Evaluation Leading to the Procedure:   ACS: No.  Non-ACS:    Anginal Classification: CCS IV  Anti-ischemic medical therapy: Maximal Therapy (2 or more classes of medications)  Non-Invasive Test Results: No non-invasive testing performed  Prior CABG: No previous CABG      History and Physical Interval Note:  02/08/2020 12:43 PM  [name]  has presented today for surgery, with the diagnosis of unstable angina.  The various methods of treatment have been discussed with the patient and family. After consideration of risks, benefits and other options for treatment, the patient has consented to  Procedure(s): LEFT HEART CATH AND CORONARY ANGIOGRAPHY (N/A) as a surgical intervention.  The patient's history has been reviewed, patient examined, no change in status, stable for surgery.  I have reviewed the patient's chart and labs.  Questions were answered to the patient's satisfaction.     Shelva Majestic

## 2020-02-08 NOTE — Progress Notes (Signed)
[facility] for heparin Indication: chest pain/ACS  Heparin Dosing Weight: 101.1 kg  Labs: No results for input(s): HGB, HCT, PLT, APTT, LABPROT, INR, HEPARINUNFRC, HEPRLOWMOCWT, CREATININE, CKTOTAL, CKMB, TROPONINI in the last 72 hours.  Assessment: 64yo male with known coronary disease and prior history of MI, PTCI to RCA and CFX, and history of CHF seen by his cardiologist last week for evaluation of unstable angina and LHC recommended. Patient now s/p cath 2/16, which revealed severe multivessel CAD. CVTS consulted for consideration of CABG, and Pharmacy consulted to start heparin 8 hours post-sheath removal. Sheath removed at 1352 per cath procedure log. Last CBC wnl on 2/11 (still pending this admit). No active bleed issues reported. Patient is not on anticoagulation PTA.  Goal of Therapy:  Heparin level 0.3-0.7 units/ml Monitor platelets by anticoagulation protocol: Yes   Plan:  No bolus. Start heparin at 1300 units/hr at 2200 (8 hours post-sheath removal) 6hr heparin level Monitor daily CBC, s/sx bleeding F/u CABG workup   Elicia Lamp, PharmD, BCPS Clinical Pharmacist 02/08/2020 6:43 PM

## 2020-02-08 NOTE — Consult Note (Signed)
[address]       [place] [address]             [phone]        Kingslee W Eke [place] Medical Record [id number] Date of Birth: May 01, 1956  Referring: No ref. provider found Primary Care: Venia Carbon, MD Primary Cardiologist:James Hochrein, MD  Chief Complaint:   Chest pain   History of Present Illness:     Mr. Chugh is a 64 year old male with multiple medical problems including known coronary artery disease status post PTCI to the RCA and circumflex in January 2019 with drug-eluting stents.  Also has a history of systolic heart failure with ejection fraction of 35% back in 2012 that had improved to 40 to 45% by January 2019.  He also had an ICD placed in 2012 device was not replaced at its expected replacement interval.  He has a history of hypertension, type 2 diabetes mellitus, dyslipidemia, chronic obstructive pulmonary disease, gastroesophageal reflux disease, peripheral vascular disease with an abdominal aortic aneurysm repaired endovascularly in May 2019.  He has known bilateral iliac artery aneurysms as well.  He has bilateral carotid artery stenoses that he says have not reached the point of requiring intervention.  He had a oositive Covid test in December 2020.  He was treated symptomatically at home and has recovered except for some persistent fatigue that may well be related to his coronary disease.  He recently presented to his cardiologist for scheduled follow-up on February 11 at which time he described recurring chest pain with radiation to his left arm associated with dyspnea.  He reported he was taking nitroglycerin sublingually 3-4 times a week for management of the symptoms.  Further evaluation with left heart catheterization was recommended and he presented to [facility] earlier today for study.  Coronary angiography reveals severe three-vessel coronary artery disease.  We have been asked to evaluate Mr. Skipton for consideration  of surgical coronary revascularization.   Current Activity/ Functional Status:   Zubrod Score: At the time of surgery this patient's most appropriate activity status/level should be described as: []     0    Normal activity, no symptoms []     1    Restricted in physical strenuous activity but ambulatory, able to do out light work [x]     2    Ambulatory and capable of self care, unable to do work activities, up and about                 more than 50%  Of the time                            []     3    Only limited self care, in bed greater than 50% of waking hours []     4    Completely disabled, no self care, confined to bed or chair []     5    Moribund  Past Medical History:  Diagnosis Date  . Arthritis   . Automatic implantable cardioverter-defibrillator in situ   . Benign essential HTN 09/17/2016  . COPD (chronic obstructive pulmonary disease) ([place])    emphysema by CXR  . Coronary artery disease     LAD a 60-70% stenosis, moderate size second diagonal with 50% stenosis, circumflex AV groove 75-80% stenosis with a branching obtuse marginal with a superior branch subtotal stenosis followed by diffuse disease, right coronary  artery  had nonobstructive disease.  EF was 35%  . Diabetes mellitus    type 2  no meds  . Diverticulosis of colon   . GERD (gastroesophageal reflux disease)    with esophagitis  . History of colonic polyps   . Hypertension   . Non-ischemic cardiomyopathy ([place])   . Presence of permanent cardiac pacemaker   . PVD (peripheral vascular disease) ([place])    bilateral common iliac artery aneurysms. Right SFA occlusion over a long segment. Left SFA disease with occlusion of the left TP trunk. Artirogram Oct. 2006  . Shortness of breath    exertion  . Sleep apnea   . Tobacco abuse   . Urinary incontinence    detrussor instability    Past Surgical History:  Procedure Laterality Date  . ABDOMINAL AORTIC ENDOVASCULAR STENT GRAFT N/A 05/07/2018   Procedure: ABDOMINAL  AORTIC ENDOVASCULAR STENT GRAFT;  Surgeon: Conrad Burlingame, MD;  Location: [place];  Service: Vascular;  Laterality: N/A;  . CARDIAC CATHETERIZATION  06/2004   negative  . COLONOSCOPY  04/2005  . COPD exacerbation    . CORONARY ANGIOPLASTY  01/09/2018  . CORONARY STENT INTERVENTION N/A 01/10/2018   Procedure: CORONARY STENT INTERVENTION;  Surgeon: Lorretta Harp, MD;  Location: [place] [facility];  Service: Cardiovascular;  Laterality: N/A;  . CORONARY/GRAFT ACUTE MI REVASCULARIZATION N/A 01/10/2018   Procedure: Coronary/Graft Acute MI Revascularization;  Surgeon: Lorretta Harp, MD;  Location: [place] [facility];  Service: Cardiovascular;  Laterality: N/A;  . EMBOLIZATION Left 03/19/2018  . EMBOLIZATION Left 03/19/2018   Procedure: EMBOLIZATION - Left Internal;  Surgeon: Conrad [place], MD;  Location: [place] [facility];  Service: Cardiovascular;  Laterality: Left;  . EMBOLIZATION Right 04/16/2018   Procedure: EMBOLIZATION;  Surgeon: Conrad Macungie, MD;  Location: [place] [facility];  Service: Cardiovascular;  Laterality: Right;  . FEMORAL ARTERY EXPLORATION Right 05/07/2018   Procedure: FEMORAL ARTERY EXPLORATION, EXTENDED PROFUNDAPLASTY;  Surgeon: Conrad Correll, MD;  Location: [place];  Service: Vascular;  Laterality: Right;  . INTRAOPERATIVE ARTERIOGRAM Right 05/07/2018   Procedure: INTRA OPERATIVE ARTERIOGRAM;  Surgeon: Conrad [place], MD;  Location: [facility];  Service: Vascular;  Laterality: Right;  . LEFT HEART CATH AND CORONARY ANGIOGRAPHY N/A 01/10/2018   Procedure: LEFT HEART CATH AND CORONARY ANGIOGRAPHY;  Surgeon: Lorretta Harp, MD;  Location: [place] [facility];  Service: Cardiovascular;  Laterality: N/A;  . PACEMAKER INSERTION  11/2005  . PACEMAKER LEAD REMOVAL N/A 10/17/2014   Procedure: PACEMAKER LEAD REMOVAL;  Surgeon: Evans Lance, MD;  Location: [place];  Service: Cardiovascular;  Laterality: N/A;  . RENAL ANGIOGRAPHY Left 04/16/2018   Procedure: RENAL ANGIOGRAPHY;  Surgeon: Conrad [place], MD;  Location: [place] [facility];  Service: Cardiovascular;  Laterality: Left;  . s/p ICD placement      Medtronic Maximo [phone] single chamber    Social History   Tobacco Use  Smoking Status Former Smoker  . Packs/day: 2.00  . Years: 15.00  . Pack years: 30.00  . Types: Cigarettes  . Quit date: 01/09/2018  . Years since quitting: 2.0  Smokeless Tobacco Never Used  Tobacco Comment   GAVE 1-800-QUIT-NOW    Social History   Substance and Sexual Activity  Alcohol Use No     No Known Allergies  Current Facility-Administered Medications  Medication Dose Route Frequency Provider Last Rate Last Admin  . 0.9 %  sodium chloride infusion   Intravenous Continuous  Troy Sine, MD 100 mL/hr at 02/08/20 1429 Rate Change at 02/08/20 1429  . 0.9 %  sodium chloride infusion  250 mL Intravenous PRN Troy Sine, MD      . acetaminophen (TYLENOL) tablet 650 mg  650 mg Oral Q4H PRN Troy Sine, MD      . albuterol (PROVENTIL) (2.5 MG/3ML) 0.083% nebulizer solution 2.5 mg  2.5 mg Nebulization Q6H PRN Troy Sine, MD      . Derrill Memo ON 02/09/2020] aspirin chewable tablet 81 mg  81 mg Oral Daily Troy Sine, MD      . atorvastatin (LIPITOR) tablet 80 mg  80 mg Oral q1800 Troy Sine, MD      . Derrill Memo ON 02/09/2020] bisoprolol (ZEBETA) tablet 10 mg  10 mg Oral Daily Troy Sine, MD      . diazepam (VALIUM) tablet 5 mg  5 mg Oral Q4H PRN Troy Sine, MD      . glipiZIDE (GLUCOTROL) tablet 5 mg  5 mg Oral BID AC Troy Sine, MD      . hydrALAZINE (APRESOLINE) injection 10 mg  10 mg Intravenous Q20 Min PRN Troy Sine, MD      . isosorbide mononitrate (IMDUR) 24 hr tablet 30 mg  30 mg Oral Daily Troy Sine, MD      . isosorbide mononitrate (IMDUR) 24 hr tablet 60 mg  60 mg Oral Daily Troy Sine, MD      . labetalol (NORMODYNE) injection 10 mg  10 mg Intravenous Q10 min PRN Troy Sine, MD      . Derrill Memo ON 02/09/2020] multivitamin with minerals  tablet 1 tablet  1 tablet Oral Daily Troy Sine, MD      . nitroGLYCERIN (NITROSTAT) SL tablet 0.4 mg  0.4 mg Sublingual Q5 Min x 3 PRN Troy Sine, MD      . ondansetron [facility]) injection 4 mg  4 mg Intravenous Q6H PRN Troy Sine, MD      . pantoprazole (PROTONIX) EC tablet 40 mg  40 mg Oral Daily Shelva Majestic A, MD      . sodium chloride flush (NS) 0.9 % injection 3 mL  3 mL Intravenous Q12H Shelva Majestic A, MD      . sodium chloride flush (NS) 0.9 % injection 3 mL  3 mL Intravenous PRN Troy Sine, MD      . Derrill Memo ON 02/09/2020] spironolactone (ALDACTONE) tablet 12.5 mg  12.5 mg Oral Daily Troy Sine, MD      . Derrill Memo ON 02/09/2020] umeclidinium bromide (INCRUSE ELLIPTA) 62.5 MCG/INH 1 puff  1 puff Inhalation Daily Troy Sine, MD      . Derrill Memo ON 02/09/2020] vitamin B-12 (CYANOCOBALAMIN) tablet 1,000 mcg  1,000 mcg Oral Daily Troy Sine, MD        Medications Prior to Admission  Medication Sig Dispense Refill Last Dose  . acetaminophen (TYLENOL) 500 MG tablet Take 1,000 mg by mouth every 6 (six) hours as needed (for pain.).   Past Week at Unknown time  . atorvastatin (LIPITOR) 80 MG tablet TAKE 1 TABLET BY MOUTH  DAILY AT 6 PM. (Patient taking differently: Take 80 mg by mouth daily at 6 PM. ) 90 tablet 0 02/08/2020 at 0730  . bisoprolol (ZEBETA) 10 MG tablet TAKE 1 TABLET BY MOUTH EVERY DAY (Patient taking differently: Take 10 mg by mouth daily. ) 90 tablet 1 02/08/2020 at 0730  .  glipiZIDE (GLUCOTROL) 5 MG tablet Take 1 tablet (5 mg total) by mouth 2 (two) times daily before a meal. 180 tablet 3 02/07/2020 at Unknown time  . isosorbide mononitrate (IMDUR) 60 MG 24 hr tablet Take 1 tablet (60 mg total) by mouth daily. 90 tablet 3 02/08/2020 at 0730  . losartan (COZAAR) 25 MG tablet TAKE 1 TABLET BY MOUTH  DAILY (Patient taking differently: Take 25 mg by mouth daily. ) 90 tablet 0 02/07/2020 at Unknown time  . metFORMIN (GLUCOPHAGE-XR) 500 MG 24 hr tablet TAKE 2  TABLETS BY MOUTH EVERY DAY WITH BREAKFAST (Patient taking differently: Take 1,000 mg by mouth daily with breakfast. ) 180 tablet 3 02/07/2020 at Unknown time  . Multiple Vitamin (MULTIVITAMIN WITH MINERALS) TABS tablet Take 1 tablet by mouth daily.   02/08/2020 at 0730  . nitroGLYCERIN (NITROSTAT) 0.4 MG SL tablet Place 1 tablet (0.4 mg total) under the tongue every 5 (five) minutes as needed. (Patient taking differently: Place 0.4 mg under the tongue every 5 (five) minutes x 3 doses as needed for chest pain. ) 25 tablet 2 Past Month at Unknown time  . pantoprazole (PROTONIX) 40 MG tablet Take 1 tablet (40 mg total) by mouth daily. (Patient taking differently: Take 40 mg by mouth at bedtime. ) 90 tablet 3 02/07/2020 at Unknown time  . spironolactone (ALDACTONE) 25 MG tablet TAKE ONE-HALF TABLET BY  MOUTH DAILY (Patient taking differently: Take 12.5 mg by mouth daily. ) 45 tablet 0 02/08/2020 at 0730  . tiotropium (SPIRIVA) 18 MCG inhalation capsule Place 1 capsule (18 mcg total) into inhaler and inhale daily. 30 capsule 11 Past Month at Unknown time  . VENTOLIN HFA 108 (90 Base) MCG/ACT inhaler TAKE 2 PUFFS BY MOUTH EVERY 6 HOURS AS NEEDED FOR WHEEZE OR SHORTNESS OF BREATH *USE W/ SPACER* (Patient taking differently: Inhale 2 puffs into the lungs every 6 (six) hours as needed for wheezing or shortness of breath. ) 18 Inhaler 1 Past Month at Unknown time  . vitamin B-12 (CYANOCOBALAMIN) 1000 MCG tablet Take 1,000 mcg by mouth daily.    02/08/2020 at 0730  . carvedilol (COREG) 3.125 MG tablet TAKE 1 TABLET BY MOUTH TWO  TIMES DAILY WITH A MEAL (Patient not taking: Reported on 02/04/2020) 180 tablet 3 Not Taking at Unknown time  . ondansetron (ZOFRAN) 4 MG tablet Take 1 tablet (4 mg total) by mouth every 6 (six) hours as needed for nausea or vomiting. (Patient not taking: Reported on 02/04/2020) 12 tablet 0 Not Taking at Unknown time    Family History  Problem Relation Age of Onset  . Stroke Father   . Coronary  artery disease Maternal Aunt   . Heart failure Maternal Aunt   . Lung cancer Maternal Aunt   . Lung cancer Maternal Uncle   . Cancer Brother        Mouth  . Cancer Sister        throat     Review of Systems:   ROS    Cardiac Review of Systems: Y or  [    ]= no  Chest Pain [  y  ]  Resting SOB [ y  ] Exertional SOB  Blue.Reese  ]  Orthopnea [  ]   Pedal Edema [   ]    Palpitations [  ] Syncope  [  ]   Presyncope [   ]  General Review of Systems: [Y] = yes [  ]=no Constitional: recent weight change [  ];  anorexia [  ]; fatigue [  ]; nausea [  ]; night sweats [  ]; fever [  ]; or chills [  ]                                                               Dental: edentulous  Eye : blurred vision [  ]; diplopia [   ]; vision changes [  ];  Amaurosis fugax[  ]; Resp: cough [  ];  wheezing[  ];  hemoptysis[  ]; shortness of breath[  ]; paroxysmal nocturnal dyspnea[  ]; dyspnea on exertion[y  ]; or orthopnea[  ];  GI:  gallstones[  ], vomiting[  ];  dysphagia[  ]; melena[  ];  hematochezia [  ]; heartburn[  ];   Hx of  Colonoscopy[  ]; GU: kidney stones [  ]; hematuria[  ];   dysuria [  ];  nocturia[  ];  history of     obstruction [  ]; urinary frequency [  ]             Skin: rash, swelling[  ];, hair loss[  ];  peripheral edema[  ];  or itching[  ]; Musculosketetal: myalgias[  ];  joint swelling[  ];  joint erythema[  ];  joint pain[ y ];  back pain[ y ];  Heme/Lymph: bruising[  ];  bleeding[  ];  anemia[  ];  Neuro: TIA[  ];  headaches[  ];  stroke[  ];  vertigo[  ];  seizures[  ];   paresthesias[  ];  difficulty walking[y  ];  Psych:depression[  ]; anxiety[  ];  Endocrine: diabetes[y  ];  thyroid dysfunction[  ];              Physical Exam: BP (!) 149/64 (BP Location: Left Arm)   Pulse 64   Temp 98 F (36.7 C) (Oral)   Resp 20   Ht 5\' 9"  (1.753 m)   Wt 130.6 kg   SpO2 98%   BMI 42.53 kg/m    General appearance: alert, cooperative, appears stated age and no distress Head:  Normocephalic, without obvious abnormality, atraumatic Neck: no adenopathy, no carotid bruit, no JVD, supple, symmetrical, trachea midline and thyroid not enlarged, symmetric, no tenderness/mass/nodules Lymph nodes: Cervical, supraclavicular, and axillary nodes normal. and There is no obvious adenopathy Resp: Breath sounds are distant but clear throughout.  He has normal respiratory excursion. Cardio: Heart is in regular rate and rhythm.  I do not detect any murmurs or gallops. GI: soft, non-tender; bowel sounds normal; no masses,  no organomegaly Extremities: All are warm and appear well-perfused.  His toes have brisk capillary refill bilaterally.  I am not convinced the peripheral pulses are palpable in the lower extremities. Neurologic: Grossly normal  Diagnostic Studies & Laboratory data:  LEFT HEART CATH AND CORONARY ANGIOGRAPHY  Conclusion    Prox RCA-1 lesion is 99% stenosed.  Prox RCA-2 lesion is 99% stenosed with 99% stenosed side branch in RV Branch.  RPDA lesion is 80% stenosed.  Prox Cx to Mid Cx lesion is 5% stenosed.  2nd Mrg lesion is 100% stenosed.  Ost Cx lesion is 70% stenosed.  1st Diag lesion is 65% stenosed.  Mid LAD-1 lesion is 40% stenosed.  Mid LAD-2  lesion is 75% stenosed.  LV end diastolic pressure is normal.   Severe multivessel CAD with the culprit lesion being a large dominant RCA with 99% proximal stenosis and 99% stenosis in the proximal portion of the previously placed mid RCA stent.  There is also a large PDA vessel with diffuse proximal 80% stenosis with a "flush and fill "phenomena due to competitive filling via left to right collaterals.  Significant concomitant CAD with diffuse 60+ percent stenosis in a large proximal diagonal vessel with 40% stenosis in the LAD proximal to a large septal perforating artery.  The LAD after the septal perforating artery on a band has 75% ostial stenosis.  The left circumflex vessel has 70% ostial stenosis.   The stent in the mid circumflex is patent although the superior branch beyond the stent is totally occluded.  There is left to left collaterals supplying this branch.  Moderately severe LV dysfunction with EF estimate approximately 35% with significant hypokinesis involving the inferior wall.  LVEDP 12 mm.  RECOMMENDATION: Angiograms were reviewed with Dr. Tamala Julian in the laboratory.  With the patient's significant multivessel CAD with good distal targets, recommend initial surgical consultation for consideration of CABG revascularization surgery.  Will need Plavix washout.  Will start heparin 8 hours post sheath removal in light of the subtotal ulcerated plaque in the RCA.   Recommendations  Antiplatelet/Anticoag Recommend Aspirin 81mg  daily for moderate CAD. Surgical consultation for CABG revascularization.  The patient will need to hold clopidogrel for washout.  Indications  Coronary artery disease involving native coronary artery of native heart with unstable angina pectoris ([place]) [I25.110 (ICD-10-CM)]  Procedural Details  Technical Details Jorma Rossey is a 64 year old male who has a history of high vessel CAD first documented in 2012.  In January 2019 he suffered an inferior MI and was found to have total occlusion of the RCA and circumflex vessel and underwent stenting.  He had concomitant LAD disease.  Patient also has peripheral vascular disease and is status post abdominal aortic stent grafting.  Remotely the patient had reduced LV function which ultimately improved.  He initially had an ICD in place but this was not upgraded at [facility].  The patient tested positive for Covid on December 21, 2019 and was quarantined.  Recently he has developed unstable anginal symptomatology awakening him from sleep with chest pain.  He was seen by Bunnie Domino.  He is now referred for definitive cardiac catheterization.  The patient was brought to the cardiac catheterization lab in the fasting state. The  patient was premedicated with Versed 2 mg and fentanyl 50 mcg.   The right radial artery was punctured via the Seldinger technique, and a 6 [place] Glidesheath Slender was inserted without difficulty.  A radial cocktail consisting of Verapamil 3 mg was administered. The patient received weight adjusted heparin. A safety J wire was advanced into the ascending aorta. Diagnostic catheterization was done with a 5 [place] TIG 4.0 catheter. A 5 French pigtail catheter was used for hand injection left ventriculography.  I asked Dr. Tamala Julian to come into the labs to review the angiograms with me who agreed with this will consideration for CABG revascularization.  A TR radial band was applied for hemostasis. The patient left the catheterization laboratory in stable condition.   Estimated blood loss <50 mL.   During this procedure medications were administered to achieve and maintain moderate conscious sedation while the patient's heart rate, blood pressure, and oxygen saturation were continuously monitored and I was present face-to-face  100% of this time.  Medications (Filter: Administrations occurring from 02/08/20 1228 to 02/08/20 1406) (important)  Continuous medications are totaled by the amount administered until 02/08/20 1406.  Heparin (Porcine) in NaCl 1000-0.9 UT/500ML-% SOLN (mL) Total volume:  500 mL  Date/Time  Rate/Dose/Volume Action  02/08/20 1255  500 mL Given    Heparin (Porcine) in NaCl 1000-0.9 UT/500ML-% SOLN (mL) Total volume:  500 mL  Date/Time  Rate/Dose/Volume Action  02/08/20 1255  500 mL Given    fentaNYL (SUBLIMAZE) injection (mcg) Total dose:  50 mcg  Date/Time  Rate/Dose/Volume Action  02/08/20 1256  50 mcg Given    midazolam (VERSED) injection (mg) Total dose:  2 mg  Date/Time  Rate/Dose/Volume Action  02/08/20 1256  2 mg Given    lidocaine (PF) (XYLOCAINE) 1 % injection (mL) Total volume:  2 mL  Date/Time  Rate/Dose/Volume Action  02/08/20 1301  2 mL Given      Radial Cocktail/Verapamil only (mL) Total volume:  10 mL  Date/Time  Rate/Dose/Volume Action  02/08/20 1306  10 mL Given    heparin injection (Units) Total dose:  6,500 Units  Date/Time  Rate/Dose/Volume Action  02/08/20 1311  6,500 Units Given    iohexol (OMNIPAQUE) 350 MG/ML injection (mL) Total volume:  85 mL  Date/Time  Rate/Dose/Volume Action  02/08/20 1352  85 mL Given    Sedation Time  Sedation Time Physician-1: 50 minutes 37 seconds  Contrast  Medication Name Total Dose  iohexol (OMNIPAQUE) 350 MG/ML injection 85 mL    Radiation/Fluoro  Fluoro time: 5.6 (min) DAP: 33.7 (Gycm2) Cumulative Air Kerma: 712 (mGy)  Coronary Findings  Diagnostic Dominance: Right Left Anterior Descending  Mid LAD-1 lesion 40% stenosed  Mid LAD-1 lesion is 40% stenosed.  Mid LAD-2 lesion 75% stenosed  Mid LAD-2 lesion is 75% stenosed.  First Diagonal Branch  1st Diag lesion 65% stenosed  1st Diag lesion is 65% stenosed.  Left Circumflex  Ost Cx lesion 70% stenosed  Ost Cx lesion is 70% stenosed.  Prox Cx to Mid Cx lesion 5% stenosed  Prox Cx to Mid Cx lesion is 5% stenosed. The lesion was previously treated.  Second Obtuse Marginal Branch  Collaterals  2nd Mrg filled by collaterals from Dist LAD.    2nd Mrg lesion 100% stenosed  2nd Mrg lesion is 100% stenosed.  Right Coronary Artery  Prox RCA-1 lesion 99% stenosed  Prox RCA-1 lesion is 99% stenosed.  Prox RCA-2 lesion 99% stenosed with side branch in RV Branch 99% stenosed  Prox RCA-2 lesion is 99% stenosed with 99% stenosed side branch in RV Branch. The lesion was previously treated.  Right Posterior Descending Artery  Collaterals  RPDA filled by collaterals from Dist Cx.    RPDA lesion 80% stenosed  RPDA lesion is 80% stenosed.  Intervention  No interventions have been documented. Wall Motion  Resting               Left Heart  Left Ventricle LV end diastolic pressure is normal. There are LV function  abnormalities. And injection left ventriculography revealed an EF estimate in the range of 35% with significant hypokinesis involving the inferior wall.  A 12 mmHg  Coronary Diagrams  Diagnostic Dominance: Right       ECHOCARDIOGRAM REPORT       Patient Name:  CIRILO MORITA Date of Exam: 02/08/2020  Medical Rec #: [id number]   Height:    69.0 in  Accession #:  [id number]  Weight:  288.0 lb  Date of Birth: [birth year]  BSA:     2.41 m  Patient Age:  89 years   BP:      149/64 mmHg  Patient Gender: M       HR:      64 bpm.  Exam Location: Inpatient   Procedure: 2D Echo   Indications:  CAD Native Vessel 414.01/I25.10    History:    Patient has prior history of Echocardiogram examinations,  most         recent 08/28/2018. CHF, CAD, COPD; Risk  Factors:Hypertension,         Diabetes, Dyslipidemia, Sleep Apnea and Former Smoker.    Sonographer:  Clayton Lefort RDCS (AE)  Referring Phys: [place] Comments: Patient is morbidly obese and suboptimal subcostal  window.   IMPRESSIONS   1. Left ventricular ejection fraction, by estimation, is 45 to 50%. The  left ventricle has mildly decreased function. The left ventricle  demonstrates global hypokinesis. The left ventricular internal cavity size  was moderately dilated. There is moderate  left ventricular hypertrophy. Left ventricular diastolic parameters are  consistent with Grade I diastolic dysfunction (impaired relaxation).  2. Right ventricular systolic function is normal. The right ventricular  size is normal.  3. The mitral valve is normal in structure and function. No evidence of  mitral valve regurgitation. No evidence of mitral stenosis.  4. The aortic valve is normal in structure and function. Aortic valve  regurgitation is not visualized. No aortic stenosis is present.  5. The inferior vena cava is normal in size  with greater than 50%  respiratory variability, suggesting right atrial pressure of 3 mmHg.   Comparison(s): Prior images unable to be directly viewed, comparison made  by report only. No significant change from prior study.   FINDINGS  Left Ventricle: Left ventricular ejection fraction, by estimation, is 45  to 50%. The left ventricle has mildly decreased function. The left  ventricle demonstrates global hypokinesis. The left ventricular internal  cavity size was moderately dilated.  There is moderate left ventricular hypertrophy. Left ventricular diastolic  parameters are consistent with Grade I diastolic dysfunction (impaired  relaxation).   Right Ventricle: The right ventricular size is normal. No increase in  right ventricular wall thickness. Right ventricular systolic function is  normal.   Left Atrium: Left atrial size was normal in size.   Right Atrium: Right atrial size was normal in size.   Pericardium: There is no evidence of pericardial effusion.   Mitral Valve: The mitral valve is normal in structure and function. Normal  mobility of the mitral valve leaflets. No evidence of mitral valve  regurgitation. No evidence of mitral valve stenosis. MV peak gradient, 3.3  mmHg. The mean mitral valve gradient  is 1.0 mmHg.   Tricuspid Valve: The tricuspid valve is normal in structure. Tricuspid  valve regurgitation is not demonstrated. No evidence of tricuspid  stenosis.   Aortic Valve: The aortic valve is normal in structure and function. Aortic  valve regurgitation is not visualized. No aortic stenosis is present.  Aortic valve mean gradient measures 7.0 mmHg. Aortic valve peak gradient  measures 11.7 mmHg. Aortic valve  area, by VTI measures 1.58 cm.   Pulmonic Valve: The pulmonic valve was normal in structure. Pulmonic valve  regurgitation is not visualized. No evidence of pulmonic stenosis.   Aorta: The aortic root is normal in size and structure.   Venous: The  inferior vena cava  is normal in size with greater than 50%  respiratory variability, suggesting right atrial pressure of 3 mmHg.   IAS/Shunts: No atrial level shunt detected by color flow Doppler.     LEFT VENTRICLE  PLAX 2D  LVIDd:     6.20 cm   Diastology  LVIDs:     5.30 cm   LV e' lateral:  5.44 cm/s  LV PW:     1.40 cm   LV E/e' lateral: 9.1  LV IVS:    1.70 cm   LV e' medial:  4.35 cm/s  LVOT diam:   2.00 cm   LV E/e' medial: 11.3  LV SV:     47.75 ml  LV SV Index:  22.69  LVOT Area:   3.14 cm    LV Volumes (MOD)  LV vol d, MOD A2C: 183.0 ml  LV vol d, MOD A4C: 229.0 ml  LV vol s, MOD A2C: 151.0 ml  LV vol s, MOD A4C: 124.0 ml  LV SV MOD A2C:   32.0 ml  LV SV MOD A4C:   229.0 ml  LV SV MOD BP:   69.5 ml   RIGHT VENTRICLE  RV Basal diam: 3.10 cm  TAPSE (M-mode): 2.8 cm   LEFT ATRIUM       Index    RIGHT ATRIUM      Index  LA diam:    3.20 cm 1.33 cm/m RA Area:   19.80 cm  LA Vol (A2C):  47.9 ml 19.86 ml/m RA Volume:  50.80 ml 21.06 ml/m  LA Vol (A4C):  42.1 ml 17.45 ml/m  LA Biplane Vol: 46.1 ml 19.11 ml/m  AORTIC VALVE  AV Area (Vmax):  1.77 cm  AV Area (Vmean):  1.62 cm  AV Area (VTI):   1.58 cm  AV Vmax:      171.00 cm/s  AV Vmean:     119.000 cm/s  AV VTI:      0.303 m  AV Peak Grad:   11.7 mmHg  AV Mean Grad:   7.0 mmHg  LVOT Vmax:     96.60 cm/s  LVOT Vmean:    61.300 cm/s  LVOT VTI:     0.152 m  LVOT/AV VTI ratio: 0.50    AORTA  Ao Root diam: 3.50 cm  Ao Asc diam: 3.60 cm   MITRAL VALVE  MV Area (PHT): 3.99 cm  SHUNTS  MV Peak grad: 3.3 mmHg  Systemic VTI: 0.15 m  MV Mean grad: 1.0 mmHg  Systemic Diam: 2.00 cm  MV Vmax:    0.91 m/s  MV Vmean:   54.2 cm/s  MV Decel Time: 190 msec  MV E velocity: 49.30 cm/s  MV A velocity: 79.70 cm/s  MV E/A ratio: 0.62   Candee Furbish MD  Electronically signed by  Candee Furbish MD  Signature Date/Time: 02/08/2020/5:22:22 PM        Recent Radiology Findings:   No results found.   I have independently reviewed the above radiologic studies and discussed with the patient   Recent Lab Findings: Lab Results  Component Value Date   WBC 9.1 02/03/2020   HGB 14.4 02/03/2020   HCT 42.3 02/03/2020   PLT 351 02/03/2020   GLUCOSE 223 (H) 02/03/2020   CHOL 125 12/10/2019   TRIG 307.0 (H) 12/10/2019   HDL 37.60 (L) 12/10/2019   LDLDIRECT 26.0 12/10/2019   LDLCALC 35 08/25/2018   ALT 27 12/10/2019   AST 21 12/10/2019   NA 135 02/03/2020  K 5.2 02/03/2020   CL 97 02/03/2020   CREATININE 0.87 02/03/2020   BUN 12 02/03/2020   CO2 21 02/03/2020   TSH 2.460 08/25/2018   INR 1.14 05/08/2018   HGBA1C 8.0 (H) 12/10/2019      Assessment / Plan:     64yo male with known coronary disease and prior history of MI, PTCI to RCA and CFX, and history of CHF was seen by his cardiologist last week for evaluation of unstable angina.  Left heart catheterization was recommended and carried out earlier today.  This demonstrates severe multivessel coronary artery disease with progression of disease within the stents.  Ejection fraction is estimated at 35% today by ventriculogram and 405-50% by echo.   He has significant inferior wall hypokinesis. There was no significant valvular disease on Echo.    Coronary bypass grafting is his best option for revascularization but is not without significant risk given his multiple comorbidities including vascular disease, history of heart failure, obesity, and his overall deconditioned state.  We will initiate preoperative evaluation to include carotid Doppler studies and reevaluation of his peripheral vascular disease with lower extremity duplex exam. He should also have PFT's.  He has been taking Plavix for his drug-eluting stents placed back in 2019.  His last dose of Plavix was earlier this morning.  We will evaluate studies as results  become available and further recommendations regarding timing of possible surgery will follow.     I  spent 40 minutes counseling the patient face to face.   Antony Odea, PA-C  02/08/2020 4:24 PM

## 2020-02-08 NOTE — Interval H&P Note (Signed)
History and Physical Interval Note:  02/08/2020 12:43 PM  [name]  has presented today for surgery, with the diagnosis of unstable angina.  The various methods of treatment have been discussed with the patient and family. After consideration of risks, benefits and other options for treatment, the patient has consented to  Procedure(s): LEFT HEART CATH AND CORONARY ANGIOGRAPHY (N/A) as a surgical intervention.  The patient's history has been reviewed, patient examined, no change in status, stable for surgery.  I have reviewed the patient's chart and labs.  Questions were answered to the patient's satisfaction.     Shelva Majestic

## 2020-02-08 NOTE — Progress Notes (Signed)
  Echocardiogram 2D Echocardiogram has been performed.  [name] 02/08/2020, 4:17 PM

## 2020-02-09 ENCOUNTER — Inpatient Hospital Stay (HOSPITAL_COMMUNITY): Payer: BC Managed Care – PPO

## 2020-02-09 ENCOUNTER — Encounter (HOSPITAL_COMMUNITY): Payer: BC Managed Care – PPO

## 2020-02-09 DIAGNOSIS — R918 Other nonspecific abnormal finding of lung field: Secondary | ICD-10-CM | POA: Diagnosis not present

## 2020-02-09 DIAGNOSIS — E782 Mixed hyperlipidemia: Secondary | ICD-10-CM

## 2020-02-09 DIAGNOSIS — I10 Essential (primary) hypertension: Secondary | ICD-10-CM | POA: Diagnosis not present

## 2020-02-09 DIAGNOSIS — E1159 Type 2 diabetes mellitus with other circulatory complications: Secondary | ICD-10-CM | POA: Diagnosis not present

## 2020-02-09 DIAGNOSIS — I25110 Atherosclerotic heart disease of native coronary artery with unstable angina pectoris: Secondary | ICD-10-CM | POA: Diagnosis not present

## 2020-02-09 DIAGNOSIS — Z01810 Encounter for preprocedural cardiovascular examination: Secondary | ICD-10-CM | POA: Diagnosis not present

## 2020-02-09 LAB — BASIC METABOLIC PANEL
Anion gap: 12 (ref 5–15)
BUN: 7 mg/dL — ABNORMAL LOW (ref 8–23)
CO2: 23 mmol/L (ref 22–32)
Calcium: 8.7 mg/dL — ABNORMAL LOW (ref 8.9–10.3)
Chloride: 102 mmol/L (ref 98–111)
Creatinine, Ser: 0.74 mg/dL (ref 0.61–1.24)
GFR calc Af Amer: >60 mL/min (ref >60)
GFR calc non Af Amer: >60 mL/min (ref >60)
Glucose, Bld: 129 mg/dL — ABNORMAL HIGH (ref 70–99)
Potassium: 3.9 mmol/L (ref 3.5–5.1)
Sodium: 137 mmol/L (ref 135–145)

## 2020-02-09 LAB — GLUCOSE, CAPILLARY
Glucose-Capillary: 150 mg/dL — ABNORMAL HIGH (ref 70–99)
Glucose-Capillary: 211 mg/dL — ABNORMAL HIGH (ref 70–99)
Glucose-Capillary: 209 mg/dL — ABNORMAL HIGH (ref 70–99)
Glucose-Capillary: 166 mg/dL — ABNORMAL HIGH (ref 70–99)

## 2020-02-09 LAB — CBC
HCT: 38.6 % — ABNORMAL LOW (ref 39.0–52.0)
Hemoglobin: 13.1 g/dL (ref 13.0–17.0)
MCH: 27.9 pg (ref 26.0–34.0)
MCHC: 33.9 g/dL (ref 30.0–36.0)
MCV: 82.1 fL (ref 80.0–100.0)
Platelets: 262 10*3/uL (ref 150–400)
RBC: 4.7 MIL/uL (ref 4.22–5.81)
RDW: 14.6 % (ref 11.5–15.5)
WBC: 6.2 10*3/uL (ref 4.0–10.5)
nRBC: 0 % (ref 0.0–0.2)

## 2020-02-09 LAB — PULMONARY FUNCTION TEST
FEF 25-75 Pre: 1.72 L/sec
FEF2575-%Pred-Pre: 62 %
FEV1-%Pred-Pre: 61 %
FEV1-Pre: 2.07 L
FEV1FVC-%Pred-Pre: 103 %
FEV6-%Pred-Pre: 62 %
FEV6-Pre: 2.68 L
FEV6FVC-%Pred-Pre: 105 %
FVC-%Pred-Pre: 59 %
FVC-Pre: 2.68 L
Pre FEV1/FVC ratio: 77 %
Pre FEV6/FVC Ratio: 100 %

## 2020-02-09 LAB — HEPARIN LEVEL (UNFRACTIONATED)
Heparin Unfractionated: 0.11 IU/mL — ABNORMAL LOW (ref 0.30–0.70)
Heparin Unfractionated: 0.24 IU/mL — ABNORMAL LOW (ref 0.30–0.70)
Heparin Unfractionated: 0.39 IU/mL (ref 0.30–0.70)

## 2020-02-09 LAB — PLATELET INHIBITION P2Y12: Platelet Function  P2Y12: 217 [PRU] (ref 182–335)

## 2020-02-09 MED ORDER — INSULIN ASPART 100 UNIT/ML ~~LOC~~ SOLN
0.0000 [IU] | Freq: Three times a day (TID) | SUBCUTANEOUS | Status: DC
  Administered 2020-02-09 – 2020-02-10 (×3): 5 [IU] via SUBCUTANEOUS
  Administered 2020-02-10 (×2): 3 [IU] via SUBCUTANEOUS

## 2020-02-09 NOTE — Progress Notes (Signed)
[place] for heparin Indication: chest pain/ACS  Heparin Dosing Weight: 101.1 kg  Labs: Recent Labs    02/09/20 0402  HGB 13.1  HCT 38.6*  PLT 262  HEPARINUNFRC 0.11*    Assessment: 64 y.o. male with CAD s/p cath, awaiting possible CABG, for heparin  Goal of Therapy:  Heparin level 0.3-0.7 units/ml Monitor platelets by anticoagulation protocol: Yes   Plan:  Increase Heparin 1600 units/hr Check heparin level in 8 hours.   Phillis Knack, PharmD, BCPS  02/09/2020 6:00 AM

## 2020-02-09 NOTE — Progress Notes (Signed)
CARDIAC REHAB PHASE I   PRE:  Rate/Rhythm: 67 SR    BP: sitting 142/68    SaO2: 95 RA  MODE:  Ambulation: 240 ft   POST:  Rate/Rhythm: 73 SR    BP: sitting 140/61     SaO2: 96 RA  Pt able to stand following sternal precautions and walk with rollator. He is limited in mobility due to lumbar disc issues (needs surgery, delayed by pandemic) and claudication. Sts he has fallen in the past if he pushed his legs too far. Normally uses a cane. If he goes to a store with his wife, he uses the electric scooter when he gets in the store. They wonder if some of his limitations, esp his SOB, have been related to his heart. The rollator helped support his back in the hall but his hips started to tighten up and he sat to rest on rollator after 170 ft.  Return to EOB, sts he had some SOB and one twinge of CP, resolved quickly. VSS. He will benefit from rollator at d/c.  Discussed sternal precautions, IS (2000 mL), mobility post op and d/c planning with pt and wife. Good reception. Wife or his daughter can be with him at d/c. He will probably benefit from PT post op. Gave materials to read and preop video to watch. Encouraged practicing adhering to "Move in the Tube" guidelines. [id number]  Gann, ACSM 02/09/2020 10:29 AM

## 2020-02-09 NOTE — Progress Notes (Addendum)
Progress Note  Patient Name: [name] Date of Encounter: 02/09/2020  Primary Cardiologist: Minus Breeding, MD   Subjective   No acute overnight events. No recurrent chest pain. He reports baseline dyspnea with activity but states this is stable. Laying almost completely flat without any problems. No palpitations, lightheadedness, dizziness, abdominal, nausea/vomiting.  Plan is for CABG after Plavix washout. Patient is a little nervous about this but no specific question at this time.  Inpatient Medications    Scheduled Meds: . aspirin  81 mg Oral Daily  . atorvastatin  80 mg Oral q1800  . bisoprolol  10 mg Oral Daily  . glipiZIDE  5 mg Oral BID AC  . isosorbide mononitrate  30 mg Oral Daily  . multivitamin with minerals  1 tablet Oral Daily  . pantoprazole  40 mg Oral Daily  . sodium chloride flush  3 mL Intravenous Q12H  . spironolactone  12.5 mg Oral Daily  . umeclidinium bromide  1 puff Inhalation Daily  . vitamin B-12  1,000 mcg Oral Daily   Continuous Infusions: . sodium chloride Stopped (02/09/20 0040)  . sodium chloride    . heparin 1,600 Units/hr (02/09/20 0620)   PRN Meds: sodium chloride, acetaminophen, albuterol, diazepam, nitroGLYCERIN, ondansetron (ZOFRAN) IV, sodium chloride flush   Vital Signs    Vitals:   02/08/20 1858 02/08/20 1931 02/08/20 2042 02/09/20 0610  BP: (!) 139/55 (!) 106/38 121/87 129/64  Pulse: 66 65 66 64  Resp: (!) 21 (!) 21 15 14   Temp:   98 F (36.7 C) 97.8 F (36.6 C)  TempSrc:   Oral Oral  SpO2: 95% 95% 94% 96%  Weight:    129.2 kg  Height:        Intake/Output Summary (Last 24 hours) at 02/09/2020 0736 Last data filed at 02/09/2020 [id number] Gross per 24 hour  Intake 1079.28 ml  Output 1600 ml  Net -520.72 ml   Last 3 Weights 02/09/2020 02/08/2020 02/03/2020  Weight (lbs) 284 lb 14.4 oz 288 lb 288 lb 9.6 oz  Weight (kg) 129.23 kg 130.636 kg 130.908 kg      Telemetry    Normal sinus rhythm, rates in the 60's to 80's,  with occasional PVCs. - Personally Reviewed  ECG    No new ECG tracing today. - Personally Reviewed  Physical Exam   GEN:  Obese Caucasian male resting comfortably in no acute distress.   Neck: Supple. Cardiac: RRR. No murmurs, rubs, or gallops.  Respiratory: Clear to auscultation bilaterally. No wheezes, rhonchi, or rales. GI: Soft, obese, non-tender. Bowel sounds present. MS: No lower extremity edema. No deformity. Skin: Warm and dry. Neuro:  No focal deficits. Psych: Normal affect. Responds appropriately.  Labs    High Sensitivity Troponin:  No results for input(s): TROPONINIHS in the last 720 hours.    Chemistry Recent Labs  Lab 02/03/20 1142 02/09/20 0402  NA 135 137  K 5.2 3.9  CL 97 102  CO2 21 23  GLUCOSE 223* 129*  BUN 12 7*  CREATININE 0.87 0.74  CALCIUM 9.4 8.7*  GFRNONAA 92 >60  GFRAA 106 >60  ANIONGAP  --  12     Hematology Recent Labs  Lab 02/03/20 1142 02/09/20 0402  WBC 9.1 6.2  RBC 5.02 4.70  HGB 14.4 13.1  HCT 42.3 38.6*  MCV 84 82.1  MCH 28.7 27.9  MCHC 34.0 33.9  RDW 14.8 14.6  PLT 351 262    BNPNo results for input(s): BNP, PROBNP in  the last 168 hours.   DDimer No results for input(s): DDIMER in the last 168 hours.   Radiology    CARDIAC CATHETERIZATION  Result Date: 02/08/2020  Prox RCA-1 lesion is 99% stenosed.  Prox RCA-2 lesion is 99% stenosed with 99% stenosed side branch in RV Branch.  RPDA lesion is 80% stenosed.  Prox Cx to Mid Cx lesion is 5% stenosed.  2nd Mrg lesion is 100% stenosed.  Ost Cx lesion is 70% stenosed.  1st Diag lesion is 65% stenosed.  Mid LAD-1 lesion is 40% stenosed.  Mid LAD-2 lesion is 75% stenosed.  LV end diastolic pressure is normal.  Severe multivessel CAD with the culprit lesion being a large dominant RCA with 99% proximal stenosis and 99% stenosis in the proximal portion of the previously placed mid RCA stent.  There is also a large PDA vessel with diffuse proximal 80% stenosis with a  "flush and fill "phenomena due to competitive filling via left to right collaterals. Significant concomitant CAD with diffuse 60+ percent stenosis in a large proximal diagonal vessel with 40% stenosis in the LAD proximal to a large septal perforating artery.  The LAD after the septal perforating artery on a band has 75% ostial stenosis.  The left circumflex vessel has 70% ostial stenosis.  The stent in the mid circumflex is patent although the superior branch beyond the stent is totally occluded.  There is left to left collaterals supplying this branch. Moderately severe LV dysfunction with EF estimate approximately 35% with significant hypokinesis involving the inferior wall.  LVEDP 12 mm. RECOMMENDATION: Angiograms were reviewed with Dr. Tamala Julian in the laboratory.  With the patient's significant multivessel CAD with good distal targets, recommend initial surgical consultation for consideration of CABG revascularization surgery.  Will need Plavix washout.  Will start heparin 8 hours post sheath removal in light of the subtotal ulcerated plaque in the RCA.   ECHOCARDIOGRAM COMPLETE  Result Date: 02/08/2020    ECHOCARDIOGRAM REPORT   Patient Name:   [name] Date of Exam: 02/08/2020 Medical Rec #:  [id number]     Height:       69.0 in Accession #:    [id number]    Weight:       288.0 lb Date of Birth:  1956-10-31    BSA:          2.41 m Patient Age:    79 years      BP:           149/64 mmHg Patient Gender: M             HR:           64 bpm. Exam Location:  Inpatient Procedure: 2D Echo Indications:    CAD Native Vessel 414.01/I25.10  History:        Patient has prior history of Echocardiogram examinations, most                 recent 08/28/2018. CHF, CAD, COPD; Risk Factors:Hypertension,                 Diabetes, Dyslipidemia, Sleep Apnea and Former Smoker.  Sonographer:    Clayton Lefort RDCS (AE) Referring Phys: [place] Comments: Patient is morbidly obese and suboptimal subcostal window.  IMPRESSIONS  1. Left ventricular ejection fraction, by estimation, is 45 to 50%. The left ventricle has mildly decreased function. The left ventricle demonstrates global hypokinesis. The left ventricular internal cavity size was moderately dilated. There  is moderate  left ventricular hypertrophy. Left ventricular diastolic parameters are consistent with Grade I diastolic dysfunction (impaired relaxation).  2. Right ventricular systolic function is normal. The right ventricular size is normal.  3. The mitral valve is normal in structure and function. No evidence of mitral valve regurgitation. No evidence of mitral stenosis.  4. The aortic valve is normal in structure and function. Aortic valve regurgitation is not visualized. No aortic stenosis is present.  5. The inferior vena cava is normal in size with greater than 50% respiratory variability, suggesting right atrial pressure of 3 mmHg. Comparison(s): Prior images unable to be directly viewed, comparison made by report only. No significant change from prior study. FINDINGS  Left Ventricle: Left ventricular ejection fraction, by estimation, is 45 to 50%. The left ventricle has mildly decreased function. The left ventricle demonstrates global hypokinesis. The left ventricular internal cavity size was moderately dilated. There is moderate left ventricular hypertrophy. Left ventricular diastolic parameters are consistent with Grade I diastolic dysfunction (impaired relaxation). Right Ventricle: The right ventricular size is normal. No increase in right ventricular wall thickness. Right ventricular systolic function is normal. Left Atrium: Left atrial size was normal in size. Right Atrium: Right atrial size was normal in size. Pericardium: There is no evidence of pericardial effusion. Mitral Valve: The mitral valve is normal in structure and function. Normal mobility of the mitral valve leaflets. No evidence of mitral valve regurgitation. No evidence of mitral valve  stenosis. MV peak gradient, 3.3 mmHg. The mean mitral valve gradient is 1.0 mmHg. Tricuspid Valve: The tricuspid valve is normal in structure. Tricuspid valve regurgitation is not demonstrated. No evidence of tricuspid stenosis. Aortic Valve: The aortic valve is normal in structure and function. Aortic valve regurgitation is not visualized. No aortic stenosis is present. Aortic valve mean gradient measures 7.0 mmHg. Aortic valve peak gradient measures 11.7 mmHg. Aortic valve area, by VTI measures 1.58 cm. Pulmonic Valve: The pulmonic valve was normal in structure. Pulmonic valve regurgitation is not visualized. No evidence of pulmonic stenosis. Aorta: The aortic root is normal in size and structure. Venous: The inferior vena cava is normal in size with greater than 50% respiratory variability, suggesting right atrial pressure of 3 mmHg. IAS/Shunts: No atrial level shunt detected by color flow Doppler.  LEFT VENTRICLE PLAX 2D LVIDd:         6.20 cm      Diastology LVIDs:         5.30 cm      LV e' lateral:   5.44 cm/s LV PW:         1.40 cm      LV E/e' lateral: 9.1 LV IVS:        1.70 cm      LV e' medial:    4.35 cm/s LVOT diam:     2.00 cm      LV E/e' medial:  11.3 LV SV:         47.75 ml LV SV Index:   22.69 LVOT Area:     3.14 cm  LV Volumes (MOD) LV vol d, MOD A2C: 183.0 ml LV vol d, MOD A4C: 229.0 ml LV vol s, MOD A2C: 151.0 ml LV vol s, MOD A4C: 124.0 ml LV SV MOD A2C:     32.0 ml LV SV MOD A4C:     229.0 ml LV SV MOD BP:      69.5 ml RIGHT VENTRICLE RV Basal diam:  3.10 cm TAPSE (M-mode): 2.8 cm  LEFT ATRIUM             Index       RIGHT ATRIUM           Index LA diam:        3.20 cm 1.33 cm/m  RA Area:     19.80 cm LA Vol (A2C):   47.9 ml 19.86 ml/m RA Volume:   50.80 ml  21.06 ml/m LA Vol (A4C):   42.1 ml 17.45 ml/m LA Biplane Vol: 46.1 ml 19.11 ml/m  AORTIC VALVE AV Area (Vmax):    1.77 cm AV Area (Vmean):   1.62 cm AV Area (VTI):     1.58 cm AV Vmax:           171.00 cm/s AV Vmean:           119.000 cm/s AV VTI:            0.303 m AV Peak Grad:      11.7 mmHg AV Mean Grad:      7.0 mmHg LVOT Vmax:         96.60 cm/s LVOT Vmean:        61.300 cm/s LVOT VTI:          0.152 m LVOT/AV VTI ratio: 0.50  AORTA Ao Root diam: 3.50 cm Ao Asc diam:  3.60 cm MITRAL VALVE MV Area (PHT): 3.99 cm    SHUNTS MV Peak grad:  3.3 mmHg    Systemic VTI:  0.15 m MV Mean grad:  1.0 mmHg    Systemic Diam: 2.00 cm MV Vmax:       0.91 m/s MV Vmean:      54.2 cm/s MV Decel Time: 190 msec MV E velocity: 49.30 cm/s MV A velocity: 79.70 cm/s MV E/A ratio:  0.62 Candee Furbish MD Electronically signed by Candee Furbish MD Signature Date/Time: 02/08/2020/5:22:22 PM    Final     Cardiac Studies   Left Heart Catheterization 02/08/2020:   Prox RCA-1 lesion is 99% stenosed.   Prox RCA-2 lesion is 99% stenosed with 99% stenosed side branch in RV Branch.   RPDA lesion is 80% stenosed.   Prox Cx to Mid Cx lesion is 5% stenosed.   2nd Mrg lesion is 100% stenosed.   Ost Cx lesion is 70% stenosed.   1st Diag lesion is 65% stenosed.   Mid LAD-1 lesion is 40% stenosed.   Mid LAD-2 lesion is 75% stenosed.   LV end diastolic pressure is normal.    Severe multivessel CAD with the culprit lesion being a large dominant RCA with 99% proximal stenosis and 99% stenosis in the proximal portion of the previously placed mid RCA stent. There is also a large PDA vessel with diffuse proximal 80% stenosis with a "flush and fill "phenomena due to competitive filling via left to right collaterals.    Significant concomitant CAD with diffuse 60+ percent stenosis in a large proximal diagonal vessel with 40% stenosis in the LAD proximal to a large septal perforating artery. The LAD after the septal perforating artery on a band has 75% ostial stenosis. The left circumflex vessel has 70% ostial stenosis. The stent in the mid circumflex is patent although the superior branch beyond the stent is totally occluded. There is left to left  collaterals supplying this branch.    Moderately severe LV dysfunction with EF estimate approximately 35% with significant hypokinesis involving the inferior wall. LVEDP 12 mm.    Recommendation:  Angiograms were reviewed with  Dr. Tamala Julian in the laboratory. With the patient's significant multivessel CAD with good distal targets, recommend initial surgical consultation for consideration of CABG revascularization surgery. Will need Plavix washout. Will start heparin 8 hours post sheath removal in light of the subtotal ulcerated plaque in the RCA.  _______________  Echocardiogram 02/08/2020:  Impressions:  1. Left ventricular ejection fraction, by estimation, is 45 to 50%. The  left ventricle has mildly decreased function. The left ventricle  demonstrates global hypokinesis. The left ventricular internal cavity size  was moderately dilated. There is moderate  left ventricular hypertrophy. Left ventricular diastolic parameters are  consistent with Grade I diastolic dysfunction (impaired relaxation).  2. Right ventricular systolic function is normal. The right ventricular  size is normal.  3. The mitral valve is normal in structure and function. No evidence of  mitral valve regurgitation. No evidence of mitral stenosis.  4. The aortic valve is normal in structure and function. Aortic valve  regurgitation is not visualized. No aortic stenosis is present.  5. The inferior vena cava is normal in size with greater than 50%  respiratory variability, suggesting right atrial pressure of 3 mmHg.   Comparison(s): Prior images unable to be directly viewed, comparison made  by report only. No significant change from prior study.    Patient Profile   Mr. Sens is a 64 y.o. male with a history of CAD with inferior MI in 12/2017 s/p DES to mid RCA with occluded LCX and non-obstructed LAD disease, chronic systolic CHF with prior ICD but was not replaced at [facility], PAD, hypertension, type 2 diabetes  mellitus and COPD. He was recently seen in our office on 02/03/2020 for evaluation of chest pain consistent with unstable angina. Cardiac catheterization was recommended at that time. Patient presented for this on 02/08/2020 and was found to have severe multivessel CAD. CT surgery was consulted and plan is for CABG after Plavix washout.   Assessment & Plan    Multivessel CAD  - Left heart catheterization on 02/08/2020 showed multivessel CAD. CT surgery was consulted and plan is for CABG after Plavix washout.  - No angina.  - Plavix being held. Continue Aspirin 81mg  daily, Bisoprolol 10mg  daily, Imdur 30mg  daily, and Lipitor 80mg  daily.   Hypertension  - BP currently well controlled. - Continue current medications: Spironolactone 12.5mg  daily, Bisoprolol 10mg  daily, and Imdur 30mg  daily.  Dyslipidemia  - Lipid panel from 11/2019: Total Cholesterol 125, Triglycerides 307, HDL 37.60, Direct LDL 26.  - LDL goal <70 given CAD.  - Continue Lipitor 80mg  daily.   Type 2 Diabetes Mellitus  - Hemolgobin A1c 8.0 in 11/2019.  - Home Glipizide continued but Metformin held. - Will also place sliding scale insulin orders.    For questions or updates, please contact [place] Please consult [email or web address] for contact info under        Signed, Darreld Mclean, PA-C  02/09/2020, 7:36 AM    I have examined the patient and reviewed assessment and plan and discussed with patient.  Agree with above as stated.    CABG in a few days after Plavix washout.  Surgery to see today.  Encouraged some mild ambulation and incentive spirometry preoperatively.  Contiue IV heparin.   Larae Grooms

## 2020-02-09 NOTE — Progress Notes (Signed)
[place] for heparin Indication: chest pain/ACS  Heparin Dosing Weight: 101.1 kg  Labs: Recent Labs    02/09/20 0402 02/09/20 1143  HGB 13.1  --   HCT 38.6*  --   PLT 262  --   HEPARINUNFRC 0.11* 0.24*  CREATININE 0.74  --     Assessment: 64 y.o. male with CAD s/p cath, awaiting possible CABG, for heparin.  Heparin level below goal. No bleeding issues noted.   Goal of Therapy:  Heparin level 0.3-0.7 units/ml Monitor platelets by anticoagulation protocol: Yes   Plan:  Increase Heparin 1800 units/hr Check heparin level in 8 hours.   Erin Hearing PharmD., BCPS Clinical Pharmacist 02/09/2020 1:14 PM

## 2020-02-09 NOTE — Progress Notes (Signed)
ANTICOAGULATION CONSULT NOTE - Follow Up Consult  Pharmacy Consult for heparin Indication: CAD  Labs: Recent Labs    02/09/20 0402 02/09/20 1143 02/09/20 2230  HGB 13.1  --   --   HCT 38.6*  --   --   PLT 262  --   --   HEPARINUNFRC 0.11* 0.24* 0.39  CREATININE 0.74  --   --     Assessment/Plan:  64yo male therapeutic on heparin after rate change. Will continue gtt at current rate and confirm stable with am labs.   Wynona Neat, PharmD, BCPS  02/09/2020,11:27 PM

## 2020-02-10 ENCOUNTER — Inpatient Hospital Stay (HOSPITAL_COMMUNITY): Payer: BC Managed Care – PPO

## 2020-02-10 ENCOUNTER — Encounter (HOSPITAL_COMMUNITY): Payer: BC Managed Care – PPO

## 2020-02-10 DIAGNOSIS — I252 Old myocardial infarction: Secondary | ICD-10-CM | POA: Diagnosis not present

## 2020-02-10 DIAGNOSIS — E782 Mixed hyperlipidemia: Secondary | ICD-10-CM | POA: Diagnosis not present

## 2020-02-10 DIAGNOSIS — E1151 Type 2 diabetes mellitus with diabetic peripheral angiopathy without gangrene: Secondary | ICD-10-CM | POA: Diagnosis not present

## 2020-02-10 DIAGNOSIS — I340 Nonrheumatic mitral (valve) insufficiency: Secondary | ICD-10-CM | POA: Diagnosis not present

## 2020-02-10 DIAGNOSIS — E785 Hyperlipidemia, unspecified: Secondary | ICD-10-CM | POA: Diagnosis not present

## 2020-02-10 DIAGNOSIS — Z01818 Encounter for other preprocedural examination: Secondary | ICD-10-CM | POA: Diagnosis not present

## 2020-02-10 DIAGNOSIS — I480 Paroxysmal atrial fibrillation: Secondary | ICD-10-CM | POA: Diagnosis not present

## 2020-02-10 DIAGNOSIS — I70201 Unspecified atherosclerosis of native arteries of extremities, right leg: Secondary | ICD-10-CM | POA: Diagnosis not present

## 2020-02-10 DIAGNOSIS — I5022 Chronic systolic (congestive) heart failure: Secondary | ICD-10-CM | POA: Diagnosis not present

## 2020-02-10 DIAGNOSIS — Z4682 Encounter for fitting and adjustment of non-vascular catheter: Secondary | ICD-10-CM | POA: Diagnosis not present

## 2020-02-10 DIAGNOSIS — E1159 Type 2 diabetes mellitus with other circulatory complications: Secondary | ICD-10-CM | POA: Diagnosis not present

## 2020-02-10 DIAGNOSIS — I25110 Atherosclerotic heart disease of native coronary artery with unstable angina pectoris: Secondary | ICD-10-CM | POA: Diagnosis not present

## 2020-02-10 DIAGNOSIS — I10 Essential (primary) hypertension: Secondary | ICD-10-CM | POA: Diagnosis not present

## 2020-02-10 DIAGNOSIS — Z6841 Body Mass Index (BMI) 40.0 and over, adult: Secondary | ICD-10-CM | POA: Diagnosis not present

## 2020-02-10 DIAGNOSIS — Z8616 Personal history of COVID-19: Secondary | ICD-10-CM | POA: Diagnosis not present

## 2020-02-10 DIAGNOSIS — I2510 Atherosclerotic heart disease of native coronary artery without angina pectoris: Secondary | ICD-10-CM | POA: Diagnosis not present

## 2020-02-10 DIAGNOSIS — I4819 Other persistent atrial fibrillation: Secondary | ICD-10-CM | POA: Diagnosis not present

## 2020-02-10 DIAGNOSIS — I25810 Atherosclerosis of coronary artery bypass graft(s) without angina pectoris: Secondary | ICD-10-CM | POA: Diagnosis not present

## 2020-02-10 DIAGNOSIS — Z01810 Encounter for preprocedural cardiovascular examination: Secondary | ICD-10-CM | POA: Diagnosis not present

## 2020-02-10 DIAGNOSIS — D62 Acute posthemorrhagic anemia: Secondary | ICD-10-CM | POA: Diagnosis not present

## 2020-02-10 DIAGNOSIS — I110 Hypertensive heart disease with heart failure: Secondary | ICD-10-CM | POA: Diagnosis not present

## 2020-02-10 DIAGNOSIS — I428 Other cardiomyopathies: Secondary | ICD-10-CM | POA: Diagnosis not present

## 2020-02-10 DIAGNOSIS — I25119 Atherosclerotic heart disease of native coronary artery with unspecified angina pectoris: Secondary | ICD-10-CM | POA: Diagnosis not present

## 2020-02-10 LAB — BASIC METABOLIC PANEL
Anion gap: 11 (ref 5–15)
BUN: 9 mg/dL (ref 8–23)
CO2: 24 mmol/L (ref 22–32)
Calcium: 8.9 mg/dL (ref 8.9–10.3)
Chloride: 101 mmol/L (ref 98–111)
Creatinine, Ser: 0.84 mg/dL (ref 0.61–1.24)
GFR calc Af Amer: >60 mL/min (ref >60)
GFR calc non Af Amer: >60 mL/min (ref >60)
Glucose, Bld: 177 mg/dL — ABNORMAL HIGH (ref 70–99)
Potassium: 4.3 mmol/L (ref 3.5–5.1)
Sodium: 136 mmol/L (ref 135–145)

## 2020-02-10 LAB — URINALYSIS, ROUTINE W REFLEX MICROSCOPIC
Bilirubin Urine: NEGATIVE
Glucose, UA: 150 mg/dL — AB
Hgb urine dipstick: NEGATIVE
Ketones, ur: NEGATIVE mg/dL
Leukocytes,Ua: NEGATIVE
Nitrite: NEGATIVE
Protein, ur: NEGATIVE mg/dL
Specific Gravity, Urine: 1.012 (ref 1.005–1.030)
pH: 6 (ref 5.0–8.0)

## 2020-02-10 LAB — HEPARIN LEVEL (UNFRACTIONATED): Heparin Unfractionated: 0.41 IU/mL (ref 0.30–0.70)

## 2020-02-10 LAB — CBC
HCT: 39.1 % (ref 39.0–52.0)
Hemoglobin: 13.3 g/dL (ref 13.0–17.0)
MCH: 27.8 pg (ref 26.0–34.0)
MCHC: 34 g/dL (ref 30.0–36.0)
MCV: 81.8 fL (ref 80.0–100.0)
Platelets: 230 10*3/uL (ref 150–400)
RBC: 4.78 MIL/uL (ref 4.22–5.81)
RDW: 14.6 % (ref 11.5–15.5)
WBC: 6.1 10*3/uL (ref 4.0–10.5)
nRBC: 0 % (ref 0.0–0.2)

## 2020-02-10 LAB — GLUCOSE, CAPILLARY
Glucose-Capillary: 169 mg/dL — ABNORMAL HIGH (ref 70–99)
Glucose-Capillary: 202 mg/dL — ABNORMAL HIGH (ref 70–99)
Glucose-Capillary: 191 mg/dL — ABNORMAL HIGH (ref 70–99)
Glucose-Capillary: 170 mg/dL — ABNORMAL HIGH (ref 70–99)

## 2020-02-10 LAB — SURGICAL PCR SCREEN
MRSA, PCR: NEGATIVE
Staphylococcus aureus: NEGATIVE

## 2020-02-10 LAB — BLOOD GAS, ARTERIAL
Acid-Base Excess: 0 mmol/L (ref 0.0–2.0)
Bicarbonate: 24.3 mmol/L (ref 20.0–28.0)
Drawn by: 23604
FIO2: 21
O2 Saturation: 95.5 %
Patient temperature: 36.5
pCO2 arterial: 39.8 mmHg (ref 32.0–48.0)
pH, Arterial: 7.4 (ref 7.350–7.450)
pO2, Arterial: 76.4 mmHg — ABNORMAL LOW (ref 83.0–108.0)

## 2020-02-10 LAB — HEMOGLOBIN A1C
Hgb A1c MFr Bld: 8.3 % — ABNORMAL HIGH (ref 4.8–5.6)
Mean Plasma Glucose: 192 mg/dL

## 2020-02-10 LAB — TYPE AND SCREEN
ABO/RH(D): O POS
Antibody Screen: NEGATIVE

## 2020-02-10 MED ORDER — POTASSIUM CHLORIDE 2 MEQ/ML IV SOLN
80.0000 meq | INTRAVENOUS | Status: DC
  Filled 2020-02-10: qty 40

## 2020-02-10 MED ORDER — SODIUM CHLORIDE 0.9 % IV SOLN
1.5000 g | INTRAVENOUS | Status: AC
  Administered 2020-02-11: 1.5 g via INTRAVENOUS
  Filled 2020-02-10: qty 1.5

## 2020-02-10 MED ORDER — CHLORHEXIDINE GLUCONATE 0.12 % MT SOLN
15.0000 mL | Freq: Once | OROMUCOSAL | Status: AC
  Administered 2020-02-11: 06:00:00 15 mL via OROMUCOSAL
  Filled 2020-02-10: qty 15

## 2020-02-10 MED ORDER — SODIUM CHLORIDE 0.9 % IV SOLN
INTRAVENOUS | Status: DC
  Filled 2020-02-10: qty 30

## 2020-02-10 MED ORDER — SODIUM CHLORIDE 0.9 % IV SOLN
750.0000 mg | INTRAVENOUS | Status: AC
  Administered 2020-02-11: 750 mg via INTRAVENOUS
  Filled 2020-02-10: qty 750

## 2020-02-10 MED ORDER — MAGNESIUM SULFATE 50 % IJ SOLN
40.0000 meq | INTRAMUSCULAR | Status: DC
  Filled 2020-02-10: qty 9.85

## 2020-02-10 MED ORDER — DEXMEDETOMIDINE HCL IN NACL 400 MCG/100ML IV SOLN
0.1000 ug/kg/h | INTRAVENOUS | Status: AC
  Administered 2020-02-11: .3 ug/kg/h via INTRAVENOUS
  Filled 2020-02-10: qty 100

## 2020-02-10 MED ORDER — VANCOMYCIN HCL 1500 MG/300ML IV SOLN
1500.0000 mg | INTRAVENOUS | Status: AC
  Administered 2020-02-11: 1500 mg via INTRAVENOUS
  Filled 2020-02-10: qty 300

## 2020-02-10 MED ORDER — PHENYLEPHRINE HCL-NACL 20-0.9 MG/250ML-% IV SOLN
30.0000 ug/min | INTRAVENOUS | Status: AC
  Administered 2020-02-11: 25 ug/min via INTRAVENOUS
  Filled 2020-02-10: qty 250

## 2020-02-10 MED ORDER — CHLORHEXIDINE GLUCONATE CLOTH 2 % EX PADS: 6.0000 | MEDICATED_PAD | Freq: Once | CUTANEOUS | Status: AC

## 2020-02-10 MED ORDER — METOPROLOL TARTRATE 12.5 MG HALF TABLET
12.5000 mg | ORAL_TABLET | Freq: Once | ORAL | Status: AC
  Administered 2020-02-11: 06:00:00 12.5 mg via ORAL
  Filled 2020-02-10: qty 1

## 2020-02-10 MED ORDER — BISACODYL 5 MG PO TBEC
5.0000 mg | DELAYED_RELEASE_TABLET | Freq: Once | ORAL | Status: AC
  Administered 2020-02-10: 5 mg via ORAL
  Filled 2020-02-10: qty 1

## 2020-02-10 MED ORDER — TEMAZEPAM 15 MG PO CAPS: 15.0000 mg | ORAL_CAPSULE | Freq: Once | ORAL | Status: DC | PRN

## 2020-02-10 MED ORDER — NOREPINEPHRINE 4 MG/250ML-% IV SOLN
0.0000 ug/min | INTRAVENOUS | Status: DC
  Filled 2020-02-10: qty 250

## 2020-02-10 MED ORDER — MILRINONE LACTATE IN DEXTROSE 20-5 MG/100ML-% IV SOLN
0.3000 ug/kg/min | INTRAVENOUS | Status: DC
  Filled 2020-02-10: qty 100

## 2020-02-10 MED ORDER — TRANEXAMIC ACID (OHS) PUMP PRIME SOLUTION
2.0000 mg/kg | INTRAVENOUS | Status: DC
  Filled 2020-02-10: qty 2.56

## 2020-02-10 MED ORDER — TRANEXAMIC ACID 1000 MG/10ML IV SOLN
1.5000 mg/kg/h | INTRAVENOUS | Status: AC
  Administered 2020-02-11: 1.5 mg/kg/h via INTRAVENOUS
  Filled 2020-02-10: qty 25

## 2020-02-10 MED ORDER — CHLORHEXIDINE GLUCONATE CLOTH 2 % EX PADS
6.0000 | MEDICATED_PAD | Freq: Once | CUTANEOUS | Status: AC
  Administered 2020-02-10: 22:00:00 6 via TOPICAL

## 2020-02-10 MED ORDER — CHLORHEXIDINE GLUCONATE 4 % EX LIQD: CUTANEOUS | Status: DC

## 2020-02-10 MED ORDER — INSULIN REGULAR(HUMAN) IN NACL 100-0.9 UT/100ML-% IV SOLN
INTRAVENOUS | Status: AC
  Administered 2020-02-11: 6 [IU]/h via INTRAVENOUS
  Filled 2020-02-10: qty 100

## 2020-02-10 MED ORDER — ACETAMINOPHEN 500 MG PO TABS
1000.0000 mg | ORAL_TABLET | Freq: Once | ORAL | Status: AC
  Administered 2020-02-11: 06:00:00 1000 mg via ORAL
  Filled 2020-02-10: qty 2

## 2020-02-10 MED ORDER — PLASMA-LYTE 148 IV SOLN
INTRAVENOUS | Status: DC
  Filled 2020-02-10: qty 2.5

## 2020-02-10 MED ORDER — NITROGLYCERIN IN D5W 200-5 MCG/ML-% IV SOLN
2.0000 ug/min | INTRAVENOUS | Status: AC
  Administered 2020-02-11: 16.6 ug/min via INTRAVENOUS
  Filled 2020-02-10: qty 250

## 2020-02-10 MED ORDER — EPINEPHRINE HCL 5 MG/250ML IV SOLN IN NS
0.0000 ug/min | INTRAVENOUS | Status: DC
  Filled 2020-02-10: qty 250

## 2020-02-10 MED ORDER — TRANEXAMIC ACID (OHS) BOLUS VIA INFUSION
15.0000 mg/kg | INTRAVENOUS | Status: AC
  Administered 2020-02-11: 1923 mg via INTRAVENOUS
  Filled 2020-02-10: qty 1923

## 2020-02-10 MED ORDER — ~~LOC~~ CARDIAC SURGERY, PATIENT & FAMILY EDUCATION
Freq: Once | Status: AC
  Filled 2020-02-10: qty 1

## 2020-02-10 MED ORDER — CHLORHEXIDINE GLUCONATE 4 % EX LIQD
CUTANEOUS | Status: AC
  Filled 2020-02-10: qty 120

## 2020-02-10 NOTE — Anesthesia Preprocedure Evaluation (Deleted)
Anesthesia Evaluation    Airway        Dental   Pulmonary former smoker,           Cardiovascular hypertension,      Neuro/Psych    GI/Hepatic   Endo/Other  diabetes  Renal/GU      Musculoskeletal   Abdominal   Peds  Hematology   Anesthesia Other Findings   Reproductive/Obstetrics                             Anesthesia Physical Anesthesia Plan Anesthesia Quick Evaluation  

## 2020-02-10 NOTE — Progress Notes (Addendum)
Progress Note  Patient Name: [name] Date of Encounter: 02/10/2020  Primary Cardiologist: Minus Breeding, MD   Subjective   No acute overnight events. Patient had a very brief episodes of mild chest discomfort last night that only lasted for a few seconds. No other recurrent chest pain. No shortness of breath. No palpitations.   Inpatient Medications    Scheduled Meds:  aspirin  81 mg Oral Daily   atorvastatin  80 mg Oral q1800   bisoprolol  10 mg Oral Daily   [START ON 02/11/2020] epinephrine  0-10 mcg/min Intravenous To OR   glipiZIDE  5 mg Oral BID AC   [START ON 02/11/2020] heparin-papaverine-plasmalyte irrigation   Irrigation To OR   insulin aspart  0-15 Units Subcutaneous TID WC   [START ON 02/11/2020] insulin   Intravenous To OR   isosorbide mononitrate  30 mg Oral Daily   [START ON 02/11/2020] magnesium sulfate  40 mEq Other To OR   multivitamin with minerals  1 tablet Oral Daily   pantoprazole  40 mg Oral Daily   [START ON 02/11/2020] phenylephrine  30-200 mcg/min Intravenous To OR   [START ON 02/11/2020] potassium chloride  80 mEq Other To OR   sodium chloride flush  3 mL Intravenous Q12H   spironolactone  12.5 mg Oral Daily   [START ON 02/11/2020] tranexamic acid  15 mg/kg Intravenous To OR   [START ON 02/11/2020] tranexamic acid  2 mg/kg Intracatheter To OR   umeclidinium bromide  1 puff Inhalation Daily   vitamin B-12  1,000 mcg Oral Daily   Continuous Infusions:  sodium chloride Stopped (02/09/20 0040)   sodium chloride     [START ON 02/11/2020] cefUROXime (ZINACEF)  IV     [START ON 02/11/2020] cefUROXime (ZINACEF)  IV     [START ON 02/11/2020] dexmedetomidine     [START ON 02/11/2020] heparin 30,000 units/NS 1000 mL solution for CELLSAVER     heparin 1,800 Units/hr (02/09/20 1612)   [START ON 02/11/2020] milrinone     [START ON 02/11/2020] nitroGLYCERIN     [START ON 02/11/2020] norepinephrine     [START ON 02/11/2020] tranexamic  acid (CYKLOKAPRON) infusion ([facility])     [START ON 02/11/2020] vancomycin     PRN Meds: sodium chloride, acetaminophen, albuterol, diazepam, nitroGLYCERIN, ondansetron (ZOFRAN) IV, sodium chloride flush   Vital Signs    Vitals:   02/09/20 1644 02/09/20 2059 02/10/20 0550 02/10/20 0827  BP: 127/67 124/74 126/77   Pulse: 62 63 62   Resp: 18 17 17 15   Temp: (!) 97.5 F (36.4 C) 98 F (36.7 C) 98.2 F (36.8 C)   TempSrc: Oral Oral Oral   SpO2: 95% 93% 93% 95%  Weight:   128.2 kg   Height:        Intake/Output Summary (Last 24 hours) at 02/10/2020 0907 Last data filed at 02/10/2020 0551 Gross per 24 hour  Intake 101.9 ml  Output 1950 ml  Net -1848.1 ml   Last 3 Weights 02/10/2020 02/09/2020 02/08/2020  Weight (lbs) 282 lb 9.6 oz 284 lb 14.4 oz 288 lb  Weight (kg) 128.187 kg 129.23 kg 130.636 kg      Telemetry    Normal sinus rhythm with rates in the 60's to 80's. Occasional PVC. 1st degree AV block. - Personally Reviewed  ECG    No new ECG tracing today. - Personally Reviewed  Physical Exam   GEN:  Obese Caucasian male resting comfortably in no acute distress.   Neck:  Supple. Cardiac: RRR. No murmurs, rubs, or gallops. Right radial cath site soft with no signs of hematoma. Radial pulses 2+ and equal bilaterally. Respiratory: Clear to auscultation bilaterally. No wheezes, rhonchi, or rales. GI: Soft, obese, non-tender. Bowel sounds present. MS: No lower extremity edema. No deformity. Skin: Warm and dry. Neuro:  No focal deficits. Psych: Normal affect. Responds appropriately.  Labs    High Sensitivity Troponin:  No results for input(s): TROPONINIHS in the last 720 hours.    Chemistry Recent Labs  Lab 02/03/20 1142 02/09/20 0402 02/10/20 0757  NA 135 137 136  K 5.2 3.9 4.3  CL 97 102 101  CO2 21 23 24   GLUCOSE 223* 129* 177*  BUN 12 7* 9  CREATININE 0.87 0.74 0.84  CALCIUM 9.4 8.7* 8.9  GFRNONAA 92 >60 >60  GFRAA 106 >60 >60  ANIONGAP  --  12 11      Hematology Recent Labs  Lab 02/03/20 1142 02/09/20 0402 02/10/20 0358  WBC 9.1 6.2 6.1  RBC 5.02 4.70 4.78  HGB 14.4 13.1 13.3  HCT 42.3 38.6* 39.1  MCV 84 82.1 81.8  MCH 28.7 27.9 27.8  MCHC 34.0 33.9 34.0  RDW 14.8 14.6 14.6  PLT 351 262 230    BNPNo results for input(s): BNP, PROBNP in the last 168 hours.   DDimer No results for input(s): DDIMER in the last 168 hours.   Radiology    CT CHEST WO CONTRAST  Result Date: 02/09/2020 CLINICAL DATA:  Aortic disease. Preop for CABG. EXAM: CT CHEST WITHOUT CONTRAST TECHNIQUE: Multidetector CT imaging of the chest was performed following the standard protocol without IV contrast. COMPARISON:  None. FINDINGS: Cardiovascular: The heart size is normal with respect to size. Coronary artery calcifications are noted. There are atherosclerotic changes of the thoracic aorta without evidence for a thoracic aortic aneurysm. Mediastinum/Nodes: --No mediastinal or hilar lymphadenopathy. --No axillary lymphadenopathy. --No supraclavicular lymphadenopathy. --Normal thyroid gland. --The esophagus is unremarkable Lungs/Pleura: The trachea is unremarkable. There is a peripheral pulmonary nodule in the right upper lobe measuring approximately 5 mm (axial series 8, image 48). There is a 3 mm pulmonary nodule in the anterior right middle lobe (axial series 8, image 98). There are areas of scarring and atelectasis bilaterally. There is no acute abnormality. There is no pneumothorax or large pleural effusion Upper Abdomen: There is hepatic steatosis. The liver is likely enlarged. There are multiple calcifications throughout the patient's spleen. Musculoskeletal: No chest wall abnormality. No acute or significant osseous findings. Review of the MIP images confirms the above findings. IMPRESSION: 1. No acute abnormality of the chest. 2. Coronary artery calcifications. 3. Hepatic steatosis with probable hepatomegaly. 4. Calcifications throughout the patient's spleen,  suggestive of previous granulomatous disease. 5. Nonspecific pulmonary nodules, measuring up to 5 mm in the right upper lobe. No follow-up needed if patient is low-risk (and has no known or suspected primary neoplasm). Non-contrast chest CT can be considered in 12 months if patient is high-risk. This recommendation follows the consensus statement: Guidelines for Management of Incidental Pulmonary Nodules Detected on CT Images: From the Fleischner Society 2017; Radiology 2017; 284:228-243. 6. Aortic Atherosclerosis (ICD10-I70.0). Electronically Signed   By: Constance Holster M.D.   On: 02/09/2020 22:17   CARDIAC CATHETERIZATION  Result Date: 02/08/2020  Prox RCA-1 lesion is 99% stenosed.  Prox RCA-2 lesion is 99% stenosed with 99% stenosed side branch in RV Branch.  RPDA lesion is 80% stenosed.  Prox Cx to Mid Cx lesion is 5% stenosed.  2nd Mrg lesion is 100% stenosed.  Ost Cx lesion is 70% stenosed.  1st Diag lesion is 65% stenosed.  Mid LAD-1 lesion is 40% stenosed.  Mid LAD-2 lesion is 75% stenosed.  LV end diastolic pressure is normal.  Severe multivessel CAD with the culprit lesion being a large dominant RCA with 99% proximal stenosis and 99% stenosis in the proximal portion of the previously placed mid RCA stent.  There is also a large PDA vessel with diffuse proximal 80% stenosis with a "flush and fill "phenomena due to competitive filling via left to right collaterals. Significant concomitant CAD with diffuse 60+ percent stenosis in a large proximal diagonal vessel with 40% stenosis in the LAD proximal to a large septal perforating artery.  The LAD after the septal perforating artery on a band has 75% ostial stenosis.  The left circumflex vessel has 70% ostial stenosis.  The stent in the mid circumflex is patent although the superior branch beyond the stent is totally occluded.  There is left to left collaterals supplying this branch. Moderately severe LV dysfunction with EF estimate  approximately 35% with significant hypokinesis involving the inferior wall.  LVEDP 12 mm. RECOMMENDATION: Angiograms were reviewed with Dr. Tamala Julian in the laboratory.  With the patient's significant multivessel CAD with good distal targets, recommend initial surgical consultation for consideration of CABG revascularization surgery.  Will need Plavix washout.  Will start heparin 8 hours post sheath removal in light of the subtotal ulcerated plaque in the RCA.   ECHOCARDIOGRAM COMPLETE  Result Date: 02/08/2020    ECHOCARDIOGRAM REPORT   Patient Name:   [name] Date of Exam: 02/08/2020 Medical Rec #:  [id number]     Height:       69.0 in Accession #:    [id number]    Weight:       288.0 lb Date of Birth:  1956-11-21    BSA:          2.41 m Patient Age:    73 years      BP:           149/64 mmHg Patient Gender: M             HR:           64 bpm. Exam Location:  Inpatient Procedure: 2D Echo Indications:    CAD Native Vessel 414.01/I25.10  History:        Patient has prior history of Echocardiogram examinations, most                 recent 08/28/2018. CHF, CAD, COPD; Risk Factors:Hypertension,                 Diabetes, Dyslipidemia, Sleep Apnea and Former Smoker.  Sonographer:    Clayton Lefort RDCS (AE) Referring Phys: [place] Comments: Patient is morbidly obese and suboptimal subcostal window. IMPRESSIONS  1. Left ventricular ejection fraction, by estimation, is 45 to 50%. The left ventricle has mildly decreased function. The left ventricle demonstrates global hypokinesis. The left ventricular internal cavity size was moderately dilated. There is moderate  left ventricular hypertrophy. Left ventricular diastolic parameters are consistent with Grade I diastolic dysfunction (impaired relaxation).  2. Right ventricular systolic function is normal. The right ventricular size is normal.  3. The mitral valve is normal in structure and function. No evidence of mitral valve regurgitation. No evidence  of mitral stenosis.  4. The aortic valve is normal in structure and function. Aortic valve  regurgitation is not visualized. No aortic stenosis is present.  5. The inferior vena cava is normal in size with greater than 50% respiratory variability, suggesting right atrial pressure of 3 mmHg. Comparison(s): Prior images unable to be directly viewed, comparison made by report only. No significant change from prior study. FINDINGS  Left Ventricle: Left ventricular ejection fraction, by estimation, is 45 to 50%. The left ventricle has mildly decreased function. The left ventricle demonstrates global hypokinesis. The left ventricular internal cavity size was moderately dilated. There is moderate left ventricular hypertrophy. Left ventricular diastolic parameters are consistent with Grade I diastolic dysfunction (impaired relaxation). Right Ventricle: The right ventricular size is normal. No increase in right ventricular wall thickness. Right ventricular systolic function is normal. Left Atrium: Left atrial size was normal in size. Right Atrium: Right atrial size was normal in size. Pericardium: There is no evidence of pericardial effusion. Mitral Valve: The mitral valve is normal in structure and function. Normal mobility of the mitral valve leaflets. No evidence of mitral valve regurgitation. No evidence of mitral valve stenosis. MV peak gradient, 3.3 mmHg. The mean mitral valve gradient is 1.0 mmHg. Tricuspid Valve: The tricuspid valve is normal in structure. Tricuspid valve regurgitation is not demonstrated. No evidence of tricuspid stenosis. Aortic Valve: The aortic valve is normal in structure and function. Aortic valve regurgitation is not visualized. No aortic stenosis is present. Aortic valve mean gradient measures 7.0 mmHg. Aortic valve peak gradient measures 11.7 mmHg. Aortic valve area, by VTI measures 1.58 cm. Pulmonic Valve: The pulmonic valve was normal in structure. Pulmonic valve regurgitation is not  visualized. No evidence of pulmonic stenosis. Aorta: The aortic root is normal in size and structure. Venous: The inferior vena cava is normal in size with greater than 50% respiratory variability, suggesting right atrial pressure of 3 mmHg. IAS/Shunts: No atrial level shunt detected by color flow Doppler.  LEFT VENTRICLE PLAX 2D LVIDd:         6.20 cm      Diastology LVIDs:         5.30 cm      LV e' lateral:   5.44 cm/s LV PW:         1.40 cm      LV E/e' lateral: 9.1 LV IVS:        1.70 cm      LV e' medial:    4.35 cm/s LVOT diam:     2.00 cm      LV E/e' medial:  11.3 LV SV:         47.75 ml LV SV Index:   22.69 LVOT Area:     3.14 cm  LV Volumes (MOD) LV vol d, MOD A2C: 183.0 ml LV vol d, MOD A4C: 229.0 ml LV vol s, MOD A2C: 151.0 ml LV vol s, MOD A4C: 124.0 ml LV SV MOD A2C:     32.0 ml LV SV MOD A4C:     229.0 ml LV SV MOD BP:      69.5 ml RIGHT VENTRICLE RV Basal diam:  3.10 cm TAPSE (M-mode): 2.8 cm LEFT ATRIUM             Index       RIGHT ATRIUM           Index LA diam:        3.20 cm 1.33 cm/m  RA Area:     19.80 cm LA Vol (A2C):   47.9 ml 19.86 ml/m RA Volume:  50.80 ml  21.06 ml/m LA Vol (A4C):   42.1 ml 17.45 ml/m LA Biplane Vol: 46.1 ml 19.11 ml/m  AORTIC VALVE AV Area (Vmax):    1.77 cm AV Area (Vmean):   1.62 cm AV Area (VTI):     1.58 cm AV Vmax:           171.00 cm/s AV Vmean:          119.000 cm/s AV VTI:            0.303 m AV Peak Grad:      11.7 mmHg AV Mean Grad:      7.0 mmHg LVOT Vmax:         96.60 cm/s LVOT Vmean:        61.300 cm/s LVOT VTI:          0.152 m LVOT/AV VTI ratio: 0.50  AORTA Ao Root diam: 3.50 cm Ao Asc diam:  3.60 cm MITRAL VALVE MV Area (PHT): 3.99 cm    SHUNTS MV Peak grad:  3.3 mmHg    Systemic VTI:  0.15 m MV Mean grad:  1.0 mmHg    Systemic Diam: 2.00 cm MV Vmax:       0.91 m/s MV Vmean:      54.2 cm/s MV Decel Time: 190 msec MV E velocity: 49.30 cm/s MV A velocity: 79.70 cm/s MV E/A ratio:  0.62 Candee Furbish MD Electronically signed by Candee Furbish MD  Signature Date/Time: 02/08/2020/5:22:22 PM    Final     Cardiac Studies   Left Heart Catheterization 02/08/2020:   Prox RCA-1 lesion is 99% stenosed.   Prox RCA-2 lesion is 99% stenosed with 99% stenosed side branch in RV Branch.   RPDA lesion is 80% stenosed.   Prox Cx to Mid Cx lesion is 5% stenosed.   2nd Mrg lesion is 100% stenosed.   Ost Cx lesion is 70% stenosed.   1st Diag lesion is 65% stenosed.   Mid LAD-1 lesion is 40% stenosed.   Mid LAD-2 lesion is 75% stenosed.   LV end diastolic pressure is normal.    Severe multivessel CAD with the culprit lesion being a large dominant RCA with 99% proximal stenosis and 99% stenosis in the proximal portion of the previously placed mid RCA stent. There is also a large PDA vessel with diffuse proximal 80% stenosis with a "flush and fill "phenomena due to competitive filling via left to right collaterals.    Significant concomitant CAD with diffuse 60+ percent stenosis in a large proximal diagonal vessel with 40% stenosis in the LAD proximal to a large septal perforating artery. The LAD after the septal perforating artery on a band has 75% ostial stenosis. The left circumflex vessel has 70% ostial stenosis. The stent in the mid circumflex is patent although the superior branch beyond the stent is totally occluded. There is left to left collaterals supplying this branch.    Moderately severe LV dysfunction with EF estimate approximately 35% with significant hypokinesis involving the inferior wall. LVEDP 12 mm.    Recommendation:  Angiograms were reviewed with Dr. Tamala Julian in the laboratory. With the patient's significant multivessel CAD with good distal targets, recommend initial surgical consultation for consideration of CABG revascularization surgery. Will need Plavix washout. Will start heparin 8 hours post sheath removal in light of the subtotal ulcerated plaque in the RCA.  _______________  Echocardiogram 02/08/2020:    Impressions:  1. Left ventricular ejection fraction, by estimation, is 45 to 50%. The  left ventricle has mildly decreased function. The left ventricle  demonstrates global hypokinesis. The left ventricular internal cavity size  was moderately dilated. There is moderate  left ventricular hypertrophy. Left ventricular diastolic parameters are  consistent with Grade I diastolic dysfunction (impaired relaxation).  2. Right ventricular systolic function is normal. The right ventricular  size is normal.  3. The mitral valve is normal in structure and function. No evidence of  mitral valve regurgitation. No evidence of mitral stenosis.  4. The aortic valve is normal in structure and function. Aortic valve  regurgitation is not visualized. No aortic stenosis is present.  5. The inferior vena cava is normal in size with greater than 50%  respiratory variability, suggesting right atrial pressure of 3 mmHg.   Comparison(s): Prior images unable to be directly viewed, comparison made  by report only. No significant change from prior study.    Patient Profile   Mr. Alvardo is a 64 y.o. male with a history of CAD with inferior MI in 12/2017 s/p DES to mid RCA with occluded LCX and non-obstructed LAD disease, chronic systolic CHF with prior ICD but was not replaced at [facility], PAD, hypertension, type 2 diabetes mellitus and COPD. He was recently seen in our office on 02/03/2020 for evaluation of chest pain consistent with unstable angina. Cardiac catheterization was recommended at that time. Patient presented for this on 02/08/2020 and was found to have severe multivessel CAD. CT surgery was consulted and plan is for CABG after Plavix washout.   Assessment & Plan    Multivessel CAD  - Left heart catheterization on 02/08/2020 showed multivessel CAD. CT surgery was consulted and plan is for CABG tomorrow after Plavix washout.  - Very brief episode of mild chest pain last night that lasted for only a few  seconds. No other angina. - Continue IV Heparin. - Plavix being held. Continue Aspirin 81mg  daily, Bisoprolol 10mg  daily, Imdur 30mg  daily, and Lipitor 80mg  daily.   Hypertension  - BP currently well controlled. - Continue current medications: Spironolactone 12.5mg  daily, Bisoprolol 10mg  daily, and Imdur 30mg  daily.  Dyslipidemia  - Lipid panel from 11/2019: Total Cholesterol 125, Triglycerides 307, HDL 37.60, Direct LDL 26.  - LDL goal <70 given CAD.  - Continue Lipitor 80mg  daily.   Type 2 Diabetes Mellitus  - Hemolgobin A1c 8.0 in 11/2019.  - Home Glipizide continued but Metformin held. - Will also place sliding scale insulin orders.    For questions or updates, please contact [place] Please consult [email or web address] for contact info under        Signed, Darreld Mclean, PA-C  02/10/2020, 9:07 AM    I have examined the patient and reviewed assessment and plan and discussed with patient.  Agree with above as stated.  Patient with a brief episode of chest discomfort which resolved spontaneously.  Otherwise feeling well.  He is ambulating without difficulty.  He is using his incentive spirometer.  The plan is for CABG tomorrow.  Larae Grooms

## 2020-02-10 NOTE — Progress Notes (Signed)
ANTICOAGULATION CONSULT NOTE  Pharmacy Consult for heparin Indication: chest pain/ACS  Heparin Dosing Weight: 101.1 kg  Labs: Recent Labs    02/09/20 0402 02/09/20 0402 02/09/20 1143 02/09/20 2230 02/10/20 0358 02/10/20 0757  HGB 13.1  --   --   --  13.3  --   HCT 38.6*  --   --   --  39.1  --   PLT 262  --   --   --  230  --   HEPARINUNFRC 0.11*   < > 0.24* 0.39 0.41  --   CREATININE 0.74  --   --   --   --  0.84   < > = values in this interval not displayed.    Assessment: 64 y.o. male with CAD s/p cath, awaiting possible CABG, for heparin.  Heparin level therapeutic this morning, CBC stable, planning for OR tomorrow.  Goal of Therapy:  Heparin level 0.3-0.7 units/ml Monitor platelets by anticoagulation protocol: Yes   Plan:  -Continue heparin 1800 units/h -Daily heparin level and CBC   Arrie Senate, PharmD, BCPS Clinical Pharmacist [phone] Please check AMION for all [facility] numbers 02/10/2020

## 2020-02-10 NOTE — Anesthesia Preprocedure Evaluation (Addendum)
Anesthesia Evaluation  Patient identified by MRN, date of birth, ID band Patient awake    Reviewed: Allergy & Precautions, NPO status , Patient's Chart, lab work & pertinent test results  Airway Mallampati: III  TM Distance: >3 FB Neck ROM: Full    Dental  (+) Edentulous Upper, Edentulous Lower   Pulmonary shortness of breath, sleep apnea , COPD, former smoker,    breath sounds clear to auscultation       Cardiovascular hypertension, Pt. on medications and Pt. on home beta blockers + angina + CAD, + Cardiac Stents, + Peripheral Vascular Disease and +CHF  + pacemaker + Cardiac Defibrillator  Rhythm:Regular Rate:Normal     Neuro/Psych  Neuromuscular disease    GI/Hepatic Neg liver ROS, GERD  ,  Endo/Other  diabetes, Type 2, Oral Hypoglycemic Agents  Renal/GU negative Renal ROS     Musculoskeletal  (+) Arthritis ,   Abdominal   Peds  Hematology negative hematology ROS (+)   Anesthesia Other Findings   Reproductive/Obstetrics                            Lab Results  Component Value Date   WBC 6.1 02/10/2020   HGB 13.3 02/10/2020   HCT 39.1 02/10/2020   MCV 81.8 02/10/2020   PLT 230 02/10/2020   Lab Results  Component Value Date   CREATININE 0.84 02/10/2020   BUN 9 02/10/2020   NA 136 02/10/2020   K 4.3 02/10/2020   CL 101 02/10/2020   CO2 24 02/10/2020   IMPRESSIONS    1. Left ventricular ejection fraction, by estimation, is 45 to 50%. The  left ventricle has mildly decreased function. The left ventricle  demonstrates global hypokinesis. The left ventricular internal cavity size  was moderately dilated. There is moderate  left ventricular hypertrophy. Left ventricular diastolic parameters are  consistent with Grade I diastolic dysfunction (impaired relaxation).  2. Right ventricular systolic function is normal. The right ventricular  size is normal.  3. The mitral valve is  normal in structure and function. No evidence of  mitral valve regurgitation. No evidence of mitral stenosis.  4. The aortic valve is normal in structure and function. Aortic valve  regurgitation is not visualized. No aortic stenosis is present.  5. The inferior vena cava is normal in size with greater than 50%  respiratory variability, suggesting right atrial pressure of 3 mmHg.   Anesthesia Physical Anesthesia Plan  ASA: IV  Anesthesia Plan: General   Post-op Pain Management:    Induction: Intravenous  PONV Risk Score and Plan: 2 and Treatment may vary due to age or medical condition, Dexamethasone and Ondansetron  Airway Management Planned: Oral ETT  Additional Equipment: Arterial line, CVP, PA Cath, TEE and Ultrasound Guidance Line Placement  Intra-op Plan:   Post-operative Plan: Post-operative intubation/ventilation  Informed Consent: I have reviewed the patients History and Physical, chart, labs and discussed the procedure including the risks, benefits and alternatives for the proposed anesthesia with the patient or authorized representative who has indicated his/her understanding and acceptance.     Dental advisory given  Plan Discussed with: CRNA  Anesthesia Plan Comments:        Anesthesia Quick Evaluation

## 2020-02-10 NOTE — Progress Notes (Signed)
Pre CABG carotid arterial duplex and ABI exams completed  Preliminary results can be found under CV proc under chart review.  02/10/2020 12:59 PM  Cordel Drewes, K., RDMS, RVT

## 2020-02-11 ENCOUNTER — Inpatient Hospital Stay (HOSPITAL_COMMUNITY): Payer: BC Managed Care – PPO

## 2020-02-11 ENCOUNTER — Inpatient Hospital Stay (HOSPITAL_COMMUNITY)
Admission: RE | Disposition: A | Discharge status: Home / Self Care | Payer: Self-pay | Source: Home / Self Care | Attending: Cardiothoracic Surgery

## 2020-02-11 ENCOUNTER — Inpatient Hospital Stay (HOSPITAL_COMMUNITY): Payer: BC Managed Care – PPO | Admitting: Certified Registered"

## 2020-02-11 ENCOUNTER — Encounter (HOSPITAL_COMMUNITY): Payer: Self-pay | Admitting: Cardiovascular Disease

## 2020-02-11 DIAGNOSIS — I110 Hypertensive heart disease with heart failure: Secondary | ICD-10-CM | POA: Diagnosis not present

## 2020-02-11 DIAGNOSIS — I25119 Atherosclerotic heart disease of native coronary artery with unspecified angina pectoris: Secondary | ICD-10-CM | POA: Diagnosis not present

## 2020-02-11 DIAGNOSIS — I340 Nonrheumatic mitral (valve) insufficiency: Secondary | ICD-10-CM | POA: Diagnosis not present

## 2020-02-11 DIAGNOSIS — I5022 Chronic systolic (congestive) heart failure: Secondary | ICD-10-CM | POA: Diagnosis not present

## 2020-02-11 DIAGNOSIS — I480 Paroxysmal atrial fibrillation: Secondary | ICD-10-CM

## 2020-02-11 DIAGNOSIS — I25110 Atherosclerotic heart disease of native coronary artery with unstable angina pectoris: Secondary | ICD-10-CM

## 2020-02-11 DIAGNOSIS — Z951 Presence of aortocoronary bypass graft: Secondary | ICD-10-CM

## 2020-02-11 HISTORY — PX: CORONARY ARTERY BYPASS GRAFT: SHX141

## 2020-02-11 HISTORY — PX: ENDOVEIN HARVEST OF GREATER SAPHENOUS VEIN: SHX5059

## 2020-02-11 HISTORY — PX: TEE WITHOUT CARDIOVERSION: SHX5443

## 2020-02-11 HISTORY — PX: CLIPPING OF ATRIAL APPENDAGE: SHX5773

## 2020-02-11 LAB — POCT I-STAT 7, (LYTES, BLD GAS, ICA,H+H)
Acid-base deficit: 4 mmol/L — ABNORMAL HIGH (ref 0.0–2.0)
Acid-base deficit: 2 mmol/L (ref 0.0–2.0)
Acid-base deficit: 5 mmol/L — ABNORMAL HIGH (ref 0.0–2.0)
Acid-base deficit: 5 mmol/L — ABNORMAL HIGH (ref 0.0–2.0)
Bicarbonate: 22.4 mmol/L (ref 20.0–28.0)
Bicarbonate: 24 mmol/L (ref 20.0–28.0)
Bicarbonate: 21.3 mmol/L (ref 20.0–28.0)
Bicarbonate: 20.7 mmol/L (ref 20.0–28.0)
Calcium, Ion: 1.13 mmol/L — ABNORMAL LOW (ref 1.15–1.40)
Calcium, Ion: 1.1 mmol/L — ABNORMAL LOW (ref 1.15–1.40)
Calcium, Ion: 1.1 mmol/L — ABNORMAL LOW (ref 1.15–1.40)
Calcium, Ion: 1.06 mmol/L — ABNORMAL LOW (ref 1.15–1.40)
HCT: 37 % — ABNORMAL LOW (ref 39.0–52.0)
HCT: 33 % — ABNORMAL LOW (ref 39.0–52.0)
HCT: 33 % — ABNORMAL LOW (ref 39.0–52.0)
HCT: 32 % — ABNORMAL LOW (ref 39.0–52.0)
Hemoglobin: 12.6 g/dL — ABNORMAL LOW (ref 13.0–17.0)
Hemoglobin: 11.2 g/dL — ABNORMAL LOW (ref 13.0–17.0)
Hemoglobin: 11.2 g/dL — ABNORMAL LOW (ref 13.0–17.0)
Hemoglobin: 10.9 g/dL — ABNORMAL LOW (ref 13.0–17.0)
O2 Saturation: 94 %
O2 Saturation: 98 %
O2 Saturation: 96 %
O2 Saturation: 93 %
Patient temperature: 36.3
Patient temperature: 36.8
Patient temperature: 37.5
Patient temperature: 37.4
Potassium: 4.7 mmol/L (ref 3.5–5.1)
Potassium: 5.2 mmol/L — ABNORMAL HIGH (ref 3.5–5.1)
Potassium: 4.8 mmol/L (ref 3.5–5.1)
Potassium: 4.7 mmol/L (ref 3.5–5.1)
Sodium: 137 mmol/L (ref 135–145)
Sodium: 137 mmol/L (ref 135–145)
Sodium: 137 mmol/L (ref 135–145)
Sodium: 138 mmol/L (ref 135–145)
TCO2: 24 mmol/L (ref 22–32)
TCO2: 25 mmol/L (ref 22–32)
TCO2: 23 mmol/L (ref 22–32)
TCO2: 22 mmol/L (ref 22–32)
pCO2 arterial: 45.7 mmHg (ref 32.0–48.0)
pCO2 arterial: 44 mmHg (ref 32.0–48.0)
pCO2 arterial: 44.3 mmHg (ref 32.0–48.0)
pCO2 arterial: 42 mmHg (ref 32.0–48.0)
pH, Arterial: 7.294 — ABNORMAL LOW (ref 7.350–7.450)
pH, Arterial: 7.344 — ABNORMAL LOW (ref 7.350–7.450)
pH, Arterial: 7.293 — ABNORMAL LOW (ref 7.350–7.450)
pH, Arterial: 7.304 — ABNORMAL LOW (ref 7.350–7.450)
pO2, Arterial: 78 mmHg — ABNORMAL LOW (ref 83.0–108.0)
pO2, Arterial: 111 mmHg — ABNORMAL HIGH (ref 83.0–108.0)
pO2, Arterial: 90 mmHg (ref 83.0–108.0)
pO2, Arterial: 75 mmHg — ABNORMAL LOW (ref 83.0–108.0)

## 2020-02-11 LAB — BASIC METABOLIC PANEL
Anion gap: 12 (ref 5–15)
Anion gap: 7 (ref 5–15)
BUN: 9 mg/dL (ref 8–23)
BUN: 9 mg/dL (ref 8–23)
CO2: 27 mmol/L (ref 22–32)
CO2: 22 mmol/L (ref 22–32)
Calcium: 9.4 mg/dL (ref 8.9–10.3)
Calcium: 7.5 mg/dL — ABNORMAL LOW (ref 8.9–10.3)
Chloride: 98 mmol/L (ref 98–111)
Chloride: 105 mmol/L (ref 98–111)
Creatinine, Ser: 1 mg/dL (ref 0.61–1.24)
Creatinine, Ser: 0.89 mg/dL (ref 0.61–1.24)
GFR calc Af Amer: >60 mL/min (ref >60)
GFR calc Af Amer: >60 mL/min (ref >60)
GFR calc non Af Amer: >60 mL/min (ref >60)
GFR calc non Af Amer: >60 mL/min (ref >60)
Glucose, Bld: 155 mg/dL — ABNORMAL HIGH (ref 70–99)
Glucose, Bld: 188 mg/dL — ABNORMAL HIGH (ref 70–99)
Potassium: 4.5 mmol/L (ref 3.5–5.1)
Potassium: 4.8 mmol/L (ref 3.5–5.1)
Sodium: 137 mmol/L (ref 135–145)
Sodium: 134 mmol/L — ABNORMAL LOW (ref 135–145)

## 2020-02-11 LAB — CBC
HCT: 39.9 % (ref 39.0–52.0)
HCT: 36.3 % — ABNORMAL LOW (ref 39.0–52.0)
HCT: 34.9 % — ABNORMAL LOW (ref 39.0–52.0)
Hemoglobin: 13.6 g/dL (ref 13.0–17.0)
Hemoglobin: 12.4 g/dL — ABNORMAL LOW (ref 13.0–17.0)
Hemoglobin: 11.9 g/dL — ABNORMAL LOW (ref 13.0–17.0)
MCH: 28.1 pg (ref 26.0–34.0)
MCH: 28.1 pg (ref 26.0–34.0)
MCH: 28.5 pg (ref 26.0–34.0)
MCHC: 34.1 g/dL (ref 30.0–36.0)
MCHC: 34.2 g/dL (ref 30.0–36.0)
MCHC: 34.1 g/dL (ref 30.0–36.0)
MCV: 82.4 fL (ref 80.0–100.0)
MCV: 82.3 fL (ref 80.0–100.0)
MCV: 83.5 fL (ref 80.0–100.0)
Platelets: 265 10*3/uL (ref 150–400)
Platelets: 253 K/uL (ref 150–400)
Platelets: 260 10*3/uL (ref 150–400)
RBC: 4.84 MIL/uL (ref 4.22–5.81)
RBC: 4.41 MIL/uL (ref 4.22–5.81)
RBC: 4.18 MIL/uL — ABNORMAL LOW (ref 4.22–5.81)
RDW: 14.7 % (ref 11.5–15.5)
RDW: 14.4 % (ref 11.5–15.5)
RDW: 14.6 % (ref 11.5–15.5)
WBC: 7.1 10*3/uL (ref 4.0–10.5)
WBC: 13.4 K/uL — ABNORMAL HIGH (ref 4.0–10.5)
WBC: 9.4 10*3/uL (ref 4.0–10.5)
nRBC: 0 % (ref 0.0–0.2)
nRBC: 0 % (ref 0.0–0.2)
nRBC: 0 % (ref 0.0–0.2)

## 2020-02-11 LAB — GLUCOSE, CAPILLARY
Glucose-Capillary: 149 mg/dL — ABNORMAL HIGH (ref 70–99)
Glucose-Capillary: 152 mg/dL — ABNORMAL HIGH (ref 70–99)
Glucose-Capillary: 153 mg/dL — ABNORMAL HIGH (ref 70–99)
Glucose-Capillary: 155 mg/dL — ABNORMAL HIGH (ref 70–99)
Glucose-Capillary: 156 mg/dL — ABNORMAL HIGH (ref 70–99)
Glucose-Capillary: 162 mg/dL — ABNORMAL HIGH (ref 70–99)
Glucose-Capillary: 173 mg/dL — ABNORMAL HIGH (ref 70–99)
Glucose-Capillary: 184 mg/dL — ABNORMAL HIGH (ref 70–99)
Glucose-Capillary: 198 mg/dL — ABNORMAL HIGH (ref 70–99)

## 2020-02-11 LAB — PROTIME-INR
INR: 1 (ref 0.8–1.2)
INR: 1.2 (ref 0.8–1.2)
Prothrombin Time: 12.9 seconds (ref 11.4–15.2)
Prothrombin Time: 15.4 s — ABNORMAL HIGH (ref 11.4–15.2)

## 2020-02-11 LAB — APTT
aPTT: 85 seconds — ABNORMAL HIGH (ref 24–36)
aPTT: 32 s (ref 24–36)

## 2020-02-11 LAB — HEMOGLOBIN AND HEMATOCRIT, BLOOD
HCT: 29.6 % — ABNORMAL LOW (ref 39.0–52.0)
Hemoglobin: 10.1 g/dL — ABNORMAL LOW (ref 13.0–17.0)

## 2020-02-11 LAB — PLATELET COUNT: Platelets: 233 10*3/uL (ref 150–400)

## 2020-02-11 LAB — MAGNESIUM: Magnesium: 2.5 mg/dL — ABNORMAL HIGH (ref 1.7–2.4)

## 2020-02-11 LAB — HEPARIN LEVEL (UNFRACTIONATED): Heparin Unfractionated: 0.55 IU/mL (ref 0.30–0.70)

## 2020-02-11 SURGERY — CORONARY ARTERY BYPASS GRAFTING (CABG)
Anesthesia: General | Site: Leg Upper | Laterality: Right

## 2020-02-11 MED ORDER — FENTANYL CITRATE (PF) 250 MCG/5ML IJ SOLN
INTRAMUSCULAR | Status: AC
  Filled 2020-02-11: qty 5

## 2020-02-11 MED ORDER — INSULIN REGULAR(HUMAN) IN NACL 100-0.9 UT/100ML-% IV SOLN
INTRAVENOUS | Status: DC
  Administered 2020-02-12: 4.8 [IU]/h via INTRAVENOUS
  Filled 2020-02-11: qty 100

## 2020-02-11 MED ORDER — LACTATED RINGERS IV SOLN: INTRAVENOUS | Status: DC | PRN

## 2020-02-11 MED ORDER — CLOPIDOGREL BISULFATE 75 MG PO TABS
75.0000 mg | ORAL_TABLET | Freq: Every day | ORAL | Status: DC
  Administered 2020-02-12 – 2020-02-18 (×7): 75 mg via ORAL
  Filled 2020-02-11 (×7): qty 1

## 2020-02-11 MED ORDER — ACETAMINOPHEN 160 MG/5ML PO SOLN: 1000.0000 mg | Freq: Four times a day (QID) | ORAL | Status: AC

## 2020-02-11 MED ORDER — ALBUMIN HUMAN 5 % IV SOLN: INTRAVENOUS | Status: DC | PRN

## 2020-02-11 MED ORDER — SODIUM CHLORIDE 0.9% FLUSH: 10.0000 mL | INTRAVENOUS | Status: DC | PRN

## 2020-02-11 MED ORDER — LEVALBUTEROL HCL 0.63 MG/3ML IN NEBU
0.6300 mg | INHALATION_SOLUTION | Freq: Four times a day (QID) | RESPIRATORY_TRACT | Status: DC
  Administered 2020-02-11 (×2): 0.63 mg via RESPIRATORY_TRACT
  Filled 2020-02-11 (×2): qty 3

## 2020-02-11 MED ORDER — DOCUSATE SODIUM 100 MG PO CAPS
200.0000 mg | ORAL_CAPSULE | Freq: Every day | ORAL | Status: DC
  Administered 2020-02-12 – 2020-02-17 (×6): 200 mg via ORAL
  Filled 2020-02-11 (×6): qty 2

## 2020-02-11 MED ORDER — MAGNESIUM SULFATE 4 GM/100ML IV SOLN
4.0000 g | Freq: Once | INTRAVENOUS | Status: AC
  Administered 2020-02-11: 4 g via INTRAVENOUS
  Filled 2020-02-11: qty 100

## 2020-02-11 MED ORDER — STERILE WATER FOR INJECTION IJ SOLN
INTRAMUSCULAR | Status: AC
  Filled 2020-02-11: qty 10

## 2020-02-11 MED ORDER — PHENYLEPHRINE HCL-NACL 20-0.9 MG/250ML-% IV SOLN: 30.0000 ug/min | INTRAVENOUS | Status: DC

## 2020-02-11 MED ORDER — ROCURONIUM BROMIDE 10 MG/ML (PF) SYRINGE
PREFILLED_SYRINGE | INTRAVENOUS | Status: AC
  Filled 2020-02-11: qty 10

## 2020-02-11 MED ORDER — HEMOSTATIC AGENTS (NO CHARGE) OPTIME
TOPICAL | Status: DC | PRN
  Administered 2020-02-11 (×5): 1 via TOPICAL

## 2020-02-11 MED ORDER — AMIODARONE HCL IN DEXTROSE 360-4.14 MG/200ML-% IV SOLN
30.0000 mg/h | INTRAVENOUS | Status: DC
  Administered 2020-02-11 – 2020-02-12 (×3): 30 mg/h via INTRAVENOUS
  Filled 2020-02-11 (×2): qty 200

## 2020-02-11 MED ORDER — FENTANYL CITRATE (PF) 250 MCG/5ML IJ SOLN
INTRAMUSCULAR | Status: DC | PRN
  Administered 2020-02-11: 450 ug via INTRAVENOUS
  Administered 2020-02-11: 150 ug via INTRAVENOUS
  Administered 2020-02-11 (×2): 250 ug via INTRAVENOUS
  Administered 2020-02-11: 50 ug via INTRAVENOUS
  Administered 2020-02-11: 100 ug via INTRAVENOUS
  Administered 2020-02-11: 250 ug via INTRAVENOUS
  Administered 2020-02-11: 100 ug via INTRAVENOUS
  Administered 2020-02-11: 250 ug via INTRAVENOUS

## 2020-02-11 MED ORDER — MIDAZOLAM HCL (PF) 10 MG/2ML IJ SOLN
INTRAMUSCULAR | Status: AC
  Filled 2020-02-11: qty 2

## 2020-02-11 MED ORDER — OXYCODONE HCL 5 MG PO TABS
5.0000 mg | ORAL_TABLET | ORAL | Status: DC | PRN
  Administered 2020-02-11 – 2020-02-13 (×3): 10 mg via ORAL
  Administered 2020-02-13 – 2020-02-14 (×2): 5 mg via ORAL
  Filled 2020-02-11 (×2): qty 1
  Filled 2020-02-11 (×3): qty 2

## 2020-02-11 MED ORDER — SODIUM CHLORIDE 0.9 % IV SOLN: INTRAVENOUS | Status: DC | PRN

## 2020-02-11 MED ORDER — ALBUMIN HUMAN 5 % IV SOLN
250.0000 mL | INTRAVENOUS | Status: AC | PRN
  Administered 2020-02-11 (×2): 12.5 g via INTRAVENOUS

## 2020-02-11 MED ORDER — SODIUM CHLORIDE 0.45 % IV SOLN: INTRAVENOUS | Status: DC | PRN

## 2020-02-11 MED ORDER — METOPROLOL TARTRATE 12.5 MG HALF TABLET
12.5000 mg | ORAL_TABLET | Freq: Two times a day (BID) | ORAL | Status: DC
  Administered 2020-02-12 – 2020-02-14 (×5): 12.5 mg via ORAL
  Filled 2020-02-11 (×6): qty 1

## 2020-02-11 MED ORDER — ORAL CARE MOUTH RINSE
15.0000 mL | Freq: Two times a day (BID) | OROMUCOSAL | Status: DC
  Administered 2020-02-12 – 2020-02-17 (×6): 15 mL via OROMUCOSAL

## 2020-02-11 MED ORDER — SODIUM BICARBONATE 8.4 % IV SOLN
50.0000 meq | Freq: Once | INTRAVENOUS | Status: AC
  Administered 2020-02-11: 50 meq via INTRAVENOUS

## 2020-02-11 MED ORDER — BUPIVACAINE HCL (PF) 0.5 % IJ SOLN
INTRAMUSCULAR | Status: DC | PRN
  Administered 2020-02-11: 30 mL

## 2020-02-11 MED ORDER — CHLORHEXIDINE GLUCONATE 0.12% ORAL RINSE (MEDLINE KIT)
15.0000 mL | Freq: Two times a day (BID) | OROMUCOSAL | Status: DC
  Administered 2020-02-11: 21:00:00 15 mL via OROMUCOSAL

## 2020-02-11 MED ORDER — ROCURONIUM BROMIDE 10 MG/ML (PF) SYRINGE
PREFILLED_SYRINGE | INTRAVENOUS | Status: DC | PRN
  Administered 2020-02-11 (×2): 50 mg via INTRAVENOUS
  Administered 2020-02-11: 100 mg via INTRAVENOUS
  Administered 2020-02-11 (×2): 50 mg via INTRAVENOUS

## 2020-02-11 MED ORDER — PANTOPRAZOLE SODIUM 40 MG PO TBEC
40.0000 mg | DELAYED_RELEASE_TABLET | Freq: Every day | ORAL | Status: DC
  Administered 2020-02-13 – 2020-02-18 (×6): 40 mg via ORAL
  Filled 2020-02-11 (×6): qty 1

## 2020-02-11 MED ORDER — PLASMA-LYTE 148 IV SOLN
INTRAVENOUS | Status: DC | PRN
  Administered 2020-02-11: 500 mL

## 2020-02-11 MED ORDER — SODIUM CHLORIDE 0.9% FLUSH
3.0000 mL | Freq: Two times a day (BID) | INTRAVENOUS | Status: DC
  Administered 2020-02-12 – 2020-02-18 (×11): 3 mL via INTRAVENOUS

## 2020-02-11 MED ORDER — ACETAMINOPHEN 500 MG PO TABS
1000.0000 mg | ORAL_TABLET | Freq: Four times a day (QID) | ORAL | Status: AC
  Administered 2020-02-12 – 2020-02-16 (×18): 1000 mg via ORAL
  Filled 2020-02-11 (×18): qty 2

## 2020-02-11 MED ORDER — AMIODARONE HCL IN DEXTROSE 360-4.14 MG/200ML-% IV SOLN: 60.0000 mg/h | INTRAVENOUS | Status: AC

## 2020-02-11 MED ORDER — HEPARIN SODIUM (PORCINE) 1000 UNIT/ML IJ SOLN
INTRAMUSCULAR | Status: DC | PRN
  Administered 2020-02-11: 45000 [IU] via INTRAVENOUS

## 2020-02-11 MED ORDER — BISACODYL 10 MG RE SUPP: 10.0000 mg | Freq: Every day | RECTAL | Status: DC

## 2020-02-11 MED ORDER — DEXTROSE 50 % IV SOLN: 0.0000 mL | INTRAVENOUS | Status: DC | PRN

## 2020-02-11 MED ORDER — FENTANYL CITRATE (PF) 250 MCG/5ML IJ SOLN
INTRAMUSCULAR | Status: AC
  Filled 2020-02-11: qty 20

## 2020-02-11 MED ORDER — PROTAMINE SULFATE 10 MG/ML IV SOLN
INTRAVENOUS | Status: AC
  Filled 2020-02-11: qty 25

## 2020-02-11 MED ORDER — STERILE WATER FOR INJECTION IJ SOLN
INTRAMUSCULAR | Status: DC | PRN
  Administered 2020-02-11: 10 mL

## 2020-02-11 MED ORDER — BISACODYL 5 MG PO TBEC
10.0000 mg | DELAYED_RELEASE_TABLET | Freq: Every day | ORAL | Status: DC
  Administered 2020-02-12 – 2020-02-18 (×6): 10 mg via ORAL
  Filled 2020-02-11 (×7): qty 2

## 2020-02-11 MED ORDER — NOREPINEPHRINE 4 MG/250ML-% IV SOLN: 0.0000 ug/min | INTRAVENOUS | Status: DC

## 2020-02-11 MED ORDER — BUPIVACAINE LIPOSOME 1.3 % IJ SUSP
20.0000 mL | Freq: Once | INTRAMUSCULAR | Status: DC
  Filled 2020-02-11: qty 20

## 2020-02-11 MED ORDER — TRANEXAMIC ACID 1000 MG/10ML IV SOLN
1.5000 mg/kg/h | INTRAVENOUS | Status: DC
  Filled 2020-02-11: qty 25

## 2020-02-11 MED ORDER — SUCCINYLCHOLINE CHLORIDE 200 MG/10ML IV SOSY
PREFILLED_SYRINGE | INTRAVENOUS | Status: AC
  Filled 2020-02-11: qty 10

## 2020-02-11 MED ORDER — ONDANSETRON HCL 4 MG/2ML IJ SOLN
4.0000 mg | Freq: Four times a day (QID) | INTRAMUSCULAR | Status: DC | PRN
  Administered 2020-02-11 – 2020-02-18 (×4): 4 mg via INTRAVENOUS
  Filled 2020-02-11 (×5): qty 2

## 2020-02-11 MED ORDER — PROPOFOL 10 MG/ML IV BOLUS
INTRAVENOUS | Status: DC | PRN
  Administered 2020-02-11: 50 mg via INTRAVENOUS
  Administered 2020-02-11: 30 mg via INTRAVENOUS

## 2020-02-11 MED ORDER — SODIUM CHLORIDE 0.9 % IV SOLN: 250.0000 mL | INTRAVENOUS | Status: DC

## 2020-02-11 MED ORDER — NITROGLYCERIN IN D5W 200-5 MCG/ML-% IV SOLN: 0.0000 ug/min | INTRAVENOUS | Status: DC

## 2020-02-11 MED ORDER — MIDAZOLAM HCL 2 MG/2ML IJ SOLN
INTRAMUSCULAR | Status: AC
  Filled 2020-02-11: qty 2

## 2020-02-11 MED ORDER — LACTATED RINGERS IV SOLN: 500.0000 mL | Freq: Once | INTRAVENOUS | Status: DC | PRN

## 2020-02-11 MED ORDER — POTASSIUM CHLORIDE 10 MEQ/50ML IV SOLN: 10.0000 meq | INTRAVENOUS | Status: AC

## 2020-02-11 MED ORDER — CHLORHEXIDINE GLUCONATE 0.12 % MT SOLN
15.0000 mL | Freq: Two times a day (BID) | OROMUCOSAL | Status: DC
  Administered 2020-02-12 – 2020-02-18 (×13): 15 mL via OROMUCOSAL
  Filled 2020-02-11 (×13): qty 15

## 2020-02-11 MED ORDER — PROTAMINE SULFATE 10 MG/ML IV SOLN
INTRAVENOUS | Status: DC | PRN
  Administered 2020-02-11: 25 mg via INTRAVENOUS
  Administered 2020-02-11: 425 mg via INTRAVENOUS

## 2020-02-11 MED ORDER — VANCOMYCIN HCL IN DEXTROSE 1-5 GM/200ML-% IV SOLN
1000.0000 mg | Freq: Once | INTRAVENOUS | Status: AC
  Administered 2020-02-11: 1000 mg via INTRAVENOUS
  Filled 2020-02-11: qty 200

## 2020-02-11 MED ORDER — ACETAMINOPHEN 160 MG/5ML PO SOLN: 650.0000 mg | Freq: Once | ORAL | Status: AC

## 2020-02-11 MED ORDER — HEPARIN SODIUM (PORCINE) 1000 UNIT/ML IJ SOLN
INTRAMUSCULAR | Status: AC
  Filled 2020-02-11: qty 1

## 2020-02-11 MED ORDER — BUPIVACAINE HCL (PF) 0.5 % IJ SOLN
INTRAMUSCULAR | Status: AC
  Filled 2020-02-11: qty 30

## 2020-02-11 MED ORDER — TRAMADOL HCL 50 MG PO TABS
50.0000 mg | ORAL_TABLET | ORAL | Status: DC | PRN
  Administered 2020-02-12: 50 mg via ORAL
  Administered 2020-02-13: 100 mg via ORAL
  Administered 2020-02-13 – 2020-02-16 (×4): 50 mg via ORAL
  Administered 2020-02-18: 100 mg via ORAL
  Filled 2020-02-11 (×2): qty 1
  Filled 2020-02-11: qty 2
  Filled 2020-02-11 (×2): qty 1
  Filled 2020-02-11: qty 2
  Filled 2020-02-11: qty 1

## 2020-02-11 MED ORDER — PHENYLEPHRINE 40 MCG/ML (10ML) SYRINGE FOR IV PUSH (FOR BLOOD PRESSURE SUPPORT)
PREFILLED_SYRINGE | INTRAVENOUS | Status: DC | PRN
  Administered 2020-02-11 (×2): 120 ug via INTRAVENOUS
  Administered 2020-02-11: 80 ug via INTRAVENOUS

## 2020-02-11 MED ORDER — CHLORHEXIDINE GLUCONATE CLOTH 2 % EX PADS
6.0000 | MEDICATED_PAD | Freq: Every day | CUTANEOUS | Status: DC
  Administered 2020-02-11 – 2020-02-18 (×2): 6 via TOPICAL

## 2020-02-11 MED ORDER — METOPROLOL TARTRATE 25 MG/10 ML ORAL SUSPENSION: 12.5000 mg | Freq: Two times a day (BID) | ORAL | Status: DC

## 2020-02-11 MED ORDER — LACTATED RINGERS IV SOLN: INTRAVENOUS | Status: DC

## 2020-02-11 MED ORDER — PHENYLEPHRINE 40 MCG/ML (10ML) SYRINGE FOR IV PUSH (FOR BLOOD PRESSURE SUPPORT)
PREFILLED_SYRINGE | INTRAVENOUS | Status: AC
  Filled 2020-02-11: qty 10

## 2020-02-11 MED ORDER — SODIUM CHLORIDE 0.9 % IV SOLN
1.5000 g | Freq: Two times a day (BID) | INTRAVENOUS | Status: AC
  Administered 2020-02-11 – 2020-02-13 (×4): 1.5 g via INTRAVENOUS
  Filled 2020-02-11 (×4): qty 1.5

## 2020-02-11 MED ORDER — SODIUM CHLORIDE 0.9% FLUSH
10.0000 mL | Freq: Two times a day (BID) | INTRAVENOUS | Status: DC
  Administered 2020-02-11 – 2020-02-14 (×5): 10 mL

## 2020-02-11 MED ORDER — METOPROLOL TARTRATE 5 MG/5ML IV SOLN: 2.5000 mg | INTRAVENOUS | Status: DC | PRN

## 2020-02-11 MED ORDER — ALBUTEROL SULFATE HFA 108 (90 BASE) MCG/ACT IN AERS
INHALATION_SPRAY | RESPIRATORY_TRACT | Status: DC | PRN
  Administered 2020-02-11 (×2): 4 via RESPIRATORY_TRACT

## 2020-02-11 MED ORDER — MIDAZOLAM HCL 2 MG/2ML IJ SOLN: 2.0000 mg | INTRAMUSCULAR | Status: DC | PRN

## 2020-02-11 MED ORDER — AMIODARONE HCL IN DEXTROSE 360-4.14 MG/200ML-% IV SOLN
30.0000 mg/h | INTRAVENOUS | Status: AC
  Administered 2020-02-11: 900 mg/h via INTRAVENOUS
  Filled 2020-02-11: qty 200

## 2020-02-11 MED ORDER — MORPHINE SULFATE (PF) 2 MG/ML IV SOLN
1.0000 mg | INTRAVENOUS | Status: DC | PRN
  Administered 2020-02-11: 2 mg via INTRAVENOUS
  Administered 2020-02-11: 4 mg via INTRAVENOUS
  Administered 2020-02-12: 2 mg via INTRAVENOUS
  Administered 2020-02-12: 4 mg via INTRAVENOUS
  Administered 2020-02-13 (×2): 2 mg via INTRAVENOUS
  Filled 2020-02-11 (×2): qty 1
  Filled 2020-02-11 (×2): qty 2
  Filled 2020-02-11 (×3): qty 1

## 2020-02-11 MED ORDER — DEXMEDETOMIDINE HCL IN NACL 400 MCG/100ML IV SOLN
0.0000 ug/kg/h | INTRAVENOUS | Status: DC
  Administered 2020-02-11: 0.6 ug/kg/h via INTRAVENOUS
  Filled 2020-02-11: qty 100

## 2020-02-11 MED ORDER — ASPIRIN 81 MG PO CHEW: 324.0000 mg | CHEWABLE_TABLET | Freq: Every day | ORAL | Status: DC

## 2020-02-11 MED ORDER — VANCOMYCIN HCL 1000 MG IV SOLR
INTRAVENOUS | Status: AC
  Filled 2020-02-11: qty 3000

## 2020-02-11 MED ORDER — FENTANYL CITRATE (PF) 250 MCG/5ML IJ SOLN
INTRAMUSCULAR | Status: AC
  Filled 2020-02-11: qty 10

## 2020-02-11 MED ORDER — ARTIFICIAL TEARS OPHTHALMIC OINT
TOPICAL_OINTMENT | OPHTHALMIC | Status: AC
  Filled 2020-02-11: qty 3.5

## 2020-02-11 MED ORDER — ASPIRIN EC 325 MG PO TBEC
325.0000 mg | DELAYED_RELEASE_TABLET | Freq: Every day | ORAL | Status: DC
  Administered 2020-02-12 – 2020-02-13 (×2): 325 mg via ORAL
  Filled 2020-02-11 (×2): qty 1

## 2020-02-11 MED ORDER — ACETAMINOPHEN 650 MG RE SUPP
650.0000 mg | Freq: Once | RECTAL | Status: AC
  Administered 2020-02-11: 15:00:00 650 mg via RECTAL

## 2020-02-11 MED ORDER — MIDAZOLAM HCL 5 MG/5ML IJ SOLN
INTRAMUSCULAR | Status: DC | PRN
  Administered 2020-02-11: 3 mg via INTRAVENOUS
  Administered 2020-02-11: 2 mg via INTRAVENOUS
  Administered 2020-02-11: 1 mg via INTRAVENOUS
  Administered 2020-02-11: 2 mg via INTRAVENOUS
  Administered 2020-02-11: 3 mg via INTRAVENOUS
  Administered 2020-02-11: 1 mg via INTRAVENOUS

## 2020-02-11 MED ORDER — BUPIVACAINE LIPOSOME 1.3 % IJ SUSP
INTRAMUSCULAR | Status: DC | PRN
  Administered 2020-02-11: 20 mL

## 2020-02-11 MED ORDER — LEVALBUTEROL HCL 0.63 MG/3ML IN NEBU
0.6300 mg | INHALATION_SOLUTION | Freq: Two times a day (BID) | RESPIRATORY_TRACT | Status: DC
  Administered 2020-02-12 – 2020-02-13 (×4): 0.63 mg via RESPIRATORY_TRACT
  Filled 2020-02-11 (×4): qty 3

## 2020-02-11 MED ORDER — PROPOFOL 10 MG/ML IV BOLUS
INTRAVENOUS | Status: AC
  Filled 2020-02-11: qty 20

## 2020-02-11 MED ORDER — SODIUM CHLORIDE 0.9% FLUSH: 3.0000 mL | INTRAVENOUS | Status: DC | PRN

## 2020-02-11 MED ORDER — 0.9 % SODIUM CHLORIDE (POUR BTL) OPTIME
TOPICAL | Status: DC | PRN
  Administered 2020-02-11: 5000 mL

## 2020-02-11 MED ORDER — ORAL CARE MOUTH RINSE
15.0000 mL | OROMUCOSAL | Status: DC
  Administered 2020-02-11 (×3): 15 mL via OROMUCOSAL

## 2020-02-11 MED ORDER — CHLORHEXIDINE GLUCONATE 0.12 % MT SOLN
15.0000 mL | OROMUCOSAL | Status: AC
  Administered 2020-02-11: 15:00:00 15 mL via OROMUCOSAL

## 2020-02-11 MED ORDER — FAMOTIDINE IN NACL 20-0.9 MG/50ML-% IV SOLN
20.0000 mg | Freq: Two times a day (BID) | INTRAVENOUS | Status: AC
  Administered 2020-02-11 (×2): 20 mg via INTRAVENOUS
  Filled 2020-02-11: qty 50

## 2020-02-11 MED ORDER — ARTIFICIAL TEARS OPHTHALMIC OINT
TOPICAL_OINTMENT | OPHTHALMIC | Status: DC | PRN
  Administered 2020-02-11: 1 via OPHTHALMIC

## 2020-02-11 MED ORDER — AMIODARONE HCL IN DEXTROSE 360-4.14 MG/200ML-% IV SOLN
INTRAVENOUS | Status: AC
  Filled 2020-02-11: qty 200

## 2020-02-11 MED ORDER — SODIUM CHLORIDE 0.9 % IV SOLN: INTRAVENOUS | Status: DC

## 2020-02-11 SURGICAL SUPPLY — 126 items
ADAPTER CARDIO PERF ANTE/RETRO (ADAPTER) ×5 IMPLANT
APPLIER CLIP 9.375 SM OPEN (CLIP)
ATRICLIP EXCLUSION VLAA 35 (Miscellaneous) ×5 IMPLANT
BAG DECANTER FOR FLEXI CONT (MISCELLANEOUS) ×5 IMPLANT
BASKET HEART (ORDER IN 25'S) (MISCELLANEOUS) ×1
BASKET HEART (ORDER IN 25S) (MISCELLANEOUS) ×4 IMPLANT
BATTERY MAXDRIVER (MISCELLANEOUS) ×5 IMPLANT
BENZOIN TINCTURE PRP APPL 2/3 (GAUZE/BANDAGES/DRESSINGS) ×5 IMPLANT
BLADE CLIPPER SURG (BLADE) IMPLANT
BLADE STERNUM SYSTEM 6 (BLADE) ×5 IMPLANT
BLADE SURG 15 STRL LF DISP TIS (BLADE) IMPLANT
BLADE SURG 15 STRL SS (BLADE)
BNDG ELASTIC 4X5.8 VLCR STR LF (GAUZE/BANDAGES/DRESSINGS) ×5 IMPLANT
BNDG ELASTIC 6X5.8 VLCR STR LF (GAUZE/BANDAGES/DRESSINGS) ×5 IMPLANT
BNDG GAUZE ELAST 4 BULKY (GAUZE/BANDAGES/DRESSINGS) ×5 IMPLANT
CANISTER SUCT 3000ML PPV (MISCELLANEOUS) ×5 IMPLANT
CANISTER WOUNDNEG PRESSURE 500 (CANNISTER) ×5 IMPLANT
CANNULA NON VENT 22FR 12 (CANNULA) ×5 IMPLANT
CATH CPB KIT HENDRICKSON (MISCELLANEOUS) ×5 IMPLANT
CATH RETROPLEGIA CORONARY 14FR (CATHETERS) ×5 IMPLANT
CATH ROBINSON RED A/P 18FR (CATHETERS) ×20 IMPLANT
CLAMP ISOLATOR SYNERGY LG (MISCELLANEOUS) ×5 IMPLANT
CLIP APPLIE 9.375 SM OPEN (CLIP) IMPLANT
CLIP RETRACTION 3.0MM CORONARY (MISCELLANEOUS) ×5 IMPLANT
CLIP VESOCCLUDE MED 24/CT (CLIP) IMPLANT
CLIP VESOCCLUDE SM WIDE 24/CT (CLIP) ×10 IMPLANT
CONN ST 1/4X3/8  BEN (MISCELLANEOUS) ×2
CONN ST 1/4X3/8 BEN (MISCELLANEOUS) ×8 IMPLANT
COVER MAYO STAND STRL (DRAPES) ×5 IMPLANT
DERMABOND ADVANCED (GAUZE/BANDAGES/DRESSINGS) ×1
DERMABOND ADVANCED .7 DNX12 (GAUZE/BANDAGES/DRESSINGS) ×4 IMPLANT
DEVICE EXCLUSIN ATRCLP VLAA 35 (Miscellaneous) ×4 IMPLANT
DRAIN CHANNEL 28F RND 3/8 FF (WOUND CARE) ×20 IMPLANT
DRAPE CARDIOVASCULAR INCISE (DRAPES) ×1
DRAPE HALF SHEET 40X57 (DRAPES) ×5 IMPLANT
DRAPE SLUSH/WARMER DISC (DRAPES) ×5 IMPLANT
DRAPE SRG 135X102X78XABS (DRAPES) ×4 IMPLANT
DRESSING PREVENA PLUS CUSTOM (GAUZE/BANDAGES/DRESSINGS) ×8 IMPLANT
DRSG AQUACEL AG ADV 3.5X14 (GAUZE/BANDAGES/DRESSINGS) IMPLANT
DRSG PREVENA PLUS CUSTOM (GAUZE/BANDAGES/DRESSINGS) ×10
ELECT CAUTERY BLADE 6.4 (BLADE) ×5 IMPLANT
ELECT REM PT RETURN 9FT ADLT (ELECTROSURGICAL) ×10
ELECTRODE REM PT RTRN 9FT ADLT (ELECTROSURGICAL) ×8 IMPLANT
FELT TEFLON 1X6 (MISCELLANEOUS) ×5 IMPLANT
GAUZE SPONGE 4X4 12PLY STRL (GAUZE/BANDAGES/DRESSINGS) ×5 IMPLANT
GAUZE SPONGE 4X4 12PLY STRL LF (GAUZE/BANDAGES/DRESSINGS) ×5 IMPLANT
GEL ULTRASOUND 20GR AQUASONIC (MISCELLANEOUS) IMPLANT
GLOVE BIO SURGEON STRL SZ 6 (GLOVE) ×5 IMPLANT
GLOVE BIO SURGEON STRL SZ 6.5 (GLOVE) ×10 IMPLANT
GLOVE BIOGEL PI IND STRL 6.5 (GLOVE) ×20 IMPLANT
GLOVE BIOGEL PI IND STRL 7.5 (GLOVE) ×8 IMPLANT
GLOVE BIOGEL PI INDICATOR 6.5 (GLOVE) ×5
GLOVE BIOGEL PI INDICATOR 7.5 (GLOVE) ×2
GLOVE NEODERM STRL 7.5 LF PF (GLOVE) ×12 IMPLANT
GLOVE SURG NEODERM 7.5  LF PF (GLOVE) ×3
GLOVE SURG SS PI 7.5 STRL IVOR (GLOVE) ×10 IMPLANT
GOWN STRL REUS W/ TWL LRG LVL3 (GOWN DISPOSABLE) ×44 IMPLANT
GOWN STRL REUS W/TWL LRG LVL3 (GOWN DISPOSABLE) ×11
HEMOSTAT POWDER SURGIFOAM 1G (HEMOSTASIS) ×15 IMPLANT
IV ADAPTER SYR DOUBLE MALE LL (MISCELLANEOUS) ×5 IMPLANT
IV CATH 22GX1 FEP (IV SOLUTION) ×5 IMPLANT
KIT BASIN OR (CUSTOM PROCEDURE TRAY) ×5 IMPLANT
KIT SUCTION CATH 14FR (SUCTIONS) ×5 IMPLANT
KIT TURNOVER KIT B (KITS) ×5 IMPLANT
KIT VASOVIEW HEMOPRO 2 VH 4000 (KITS) ×5 IMPLANT
MARKER GRAFT CORONARY BYPASS (MISCELLANEOUS) ×15 IMPLANT
NEEDLE 18GX1X1/2 (RX/OR ONLY) (NEEDLE) ×5 IMPLANT
NS IRRIG 1000ML POUR BTL (IV SOLUTION) ×25 IMPLANT
PACK E OPEN HEART (SUTURE) ×5 IMPLANT
PACK OPEN HEART (CUSTOM PROCEDURE TRAY) ×5 IMPLANT
PACK SPY-PHI (KITS) ×5 IMPLANT
PAD ARMBOARD 7.5X6 YLW CONV (MISCELLANEOUS) ×10 IMPLANT
PAD ELECT DEFIB RADIOL ZOLL (MISCELLANEOUS) ×5 IMPLANT
PENCIL BUTTON HOLSTER BLD 10FT (ELECTRODE) ×5 IMPLANT
PLATE STERNAL 2.3X208 14H 2-PK (Plate) ×5 IMPLANT
POSITIONER HEAD DONUT 9IN (MISCELLANEOUS) ×5 IMPLANT
POWDER SURGICEL 3.0 GRAM (HEMOSTASIS) ×5 IMPLANT
PUNCH AORTIC ROTATE  4.5MM 8IN (MISCELLANEOUS) ×5 IMPLANT
PUTTY DBX 5CC (Putty) ×5 IMPLANT
SCREW LOCKING TI 2.3X11MM (Screw) ×30 IMPLANT
SCREW LOCKING TI 2.3X13MM (Screw) ×10 IMPLANT
SCREW STERNAL LOCK 2.3MM (Screw) ×20 IMPLANT
SEALANT SURG COSEAL 8ML (VASCULAR PRODUCTS) ×5 IMPLANT
SET CARDIOPLEGIA MPS 5001102 (MISCELLANEOUS) ×5 IMPLANT
SHEARS HARMONIC 9CM CVD (BLADE) IMPLANT
SHEARS HARMONIC STRL 23CM (MISCELLANEOUS) IMPLANT
STAPLER VISISTAT 35W (STAPLE) ×5 IMPLANT
SUT BONE WAX W31G (SUTURE) ×5 IMPLANT
SUT ETHIBOND X763 2 0 SH 1 (SUTURE) ×10 IMPLANT
SUT MNCRL AB 3-0 PS2 18 (SUTURE) ×10 IMPLANT
SUT MNCRL AB 4-0 PS2 18 (SUTURE) ×5 IMPLANT
SUT PDS AB 1 CTX 36 (SUTURE) ×10 IMPLANT
SUT PROLENE 3 0 SH DA (SUTURE) ×10 IMPLANT
SUT PROLENE 4 0 SH DA (SUTURE) ×5 IMPLANT
SUT PROLENE 5 0 C 1 36 (SUTURE) IMPLANT
SUT PROLENE 6 0 C 1 30 (SUTURE) ×15 IMPLANT
SUT PROLENE 7 0 BV 1 (SUTURE) ×5 IMPLANT
SUT PROLENE 7 0 BV1 MDA (SUTURE) ×5 IMPLANT
SUT PROLENE 8 0 BV175 6 (SUTURE) IMPLANT
SUT PROLENE BLUE 7 0 (SUTURE) ×5 IMPLANT
SUT SILK  1 MH (SUTURE) ×1
SUT SILK 1 MH (SUTURE) ×4 IMPLANT
SUT SILK 2 0 SH CR/8 (SUTURE) IMPLANT
SUT SILK 3 0 SH CR/8 (SUTURE) IMPLANT
SUT STEEL 6MS V (SUTURE) ×5 IMPLANT
SUT STEEL SZ 6 DBL 3X14 BALL (SUTURE) ×5 IMPLANT
SUT VIC AB 2-0 CT1 27 (SUTURE) ×1
SUT VIC AB 2-0 CT1 TAPERPNT 27 (SUTURE) ×4 IMPLANT
SUT VIC AB 2-0 CTX 27 (SUTURE) IMPLANT
SUT VIC AB 3-0 SH 27 (SUTURE)
SUT VIC AB 3-0 SH 27X BRD (SUTURE) IMPLANT
SUT VIC AB 3-0 X1 27 (SUTURE) IMPLANT
SYR 10ML LL (SYRINGE) ×10 IMPLANT
SYR 30ML LL (SYRINGE) ×5 IMPLANT
SYR 3ML LL SCALE MARK (SYRINGE) ×5 IMPLANT
SYR 50ML SLIP (SYRINGE) IMPLANT
SYR 5ML LL (SYRINGE) IMPLANT
SYSTEM SAHARA CHEST DRAIN ATS (WOUND CARE) ×5 IMPLANT
TOWEL GREEN STERILE (TOWEL DISPOSABLE) ×5 IMPLANT
TOWEL GREEN STERILE FF (TOWEL DISPOSABLE) IMPLANT
TRAY FOLEY SLVR 16FR TEMP STAT (SET/KITS/TRAYS/PACK) ×5 IMPLANT
TUBING ART PRESS 48 MALE/FEM (TUBING) ×10 IMPLANT
TUBING LAP HI FLOW INSUFFLATIO (TUBING) ×5 IMPLANT
UNDERPAD 30X30 (UNDERPADS AND DIAPERS) ×5 IMPLANT
WATER STERILE IRR 1000ML POUR (IV SOLUTION) ×10 IMPLANT
WATER STERILE IRR 1000ML UROMA (IV SOLUTION) ×5 IMPLANT

## 2020-02-11 NOTE — Brief Op Note (Addendum)
02/08/2020 - 02/11/2020  12:43 PM  PATIENT:  [name]  64 y.o. male  PRE-OPERATIVE DIAGNOSIS:  CORONARY ARTERY DISEASE  POST-OPERATIVE DIAGNOSIS:  CORONARY ARTERY DISEASE  PROCEDURE:  Procedure(s) with comments: CORONARY ARTERY BYPASS GRAFTING (CABG) using LIMA to LAD; RIMA to PL; Endoscopic right greater saphenous vein harvest: SVC to Diag; SVC to OM1. (N/A) - BILATERAL IMA TRANSESOPHAGEAL ECHOCARDIOGRAM (TEE) (N/A) INDOCYANINE GREEN FLUORESCENCE IMAGING (ICG) (N/A) Clipping Of Atrial Appendage using AtriCure 35 MM AtriClip. (N/A) Limited Maze Procedure using AtriCure Isolator Synergy Clamp (N/A) Endovein Harvest Of Greater Saphenous Vein using right lower extremity. (Right)  SURGEON:   Wonda Olds, MD   PHYSICIAN ASSISTANT: Roddenberry  ANESTHESIA:   general  EBL:  Per perfusion and anesthesia records  BLOOD ADMINISTERED:none  DRAINS: Bilateral pleural drains and mediastinal drain   LOCAL MEDICATIONS USED:  bilateral intercostal Exparel  SPECIMEN:  No Specimen  DISPOSITION OF SPECIMEN:  N/A  COUNTS:  YES  DICTATION: .Dragon Dictation  PLAN OF CARE: Admit to inpatient   PATIENT DISPOSITION:  ICU - intubated and hemodynamically stable.   Delay start of Pharmacological VTE agent (>24hrs) due to surgical blood loss or risk of bleeding: yes

## 2020-02-11 NOTE — Progress Notes (Signed)
Patient was transported from the OR to 2H05 without any complications.

## 2020-02-11 NOTE — Progress Notes (Signed)
      [address]       [place] [address]             [phone]      S/p CABG x 4, PVI  Intubated, sedated  BP (!) 95/57   Pulse 92   Temp 98.2 F (36.8 C)   Resp 18   Ht 5\' 9"  (1.753 m)   Wt 127.6 kg   SpO2 99%   BMI 41.56 kg/m  27/16 CI- 2.0  Minimal CT output  Hct= 36  Doing well early postop  Analeya Luallen C. Roxan Hockey, MD [facility] [phone]

## 2020-02-11 NOTE — Anesthesia Procedure Notes (Signed)
Central Venous Catheter Insertion Performed by: Suzette Battiest, MD, anesthesiologist Start/End2/19/2021 7:05 AM, 02/11/2020 7:15 AM Patient location: Pre-op. Preanesthetic checklist: patient identified, IV checked, site marked, risks and benefits discussed, surgical consent, monitors and equipment checked, pre-op evaluation, timeout performed and anesthesia consent Position: Trendelenburg Lidocaine 1% used for infiltration and patient sedated Hand hygiene performed , maximum sterile barriers used  and Seldinger technique used Catheter size: 9 Fr Total catheter length 10. Central line and PA cath was placed.MAC introducer Swan type:thermodilution PA Cath depth:50 Procedure performed using ultrasound guided technique. Ultrasound Notes:anatomy identified, needle tip was noted to be adjacent to the nerve/plexus identified, no ultrasound evidence of intravascular and/or intraneural injection and image(s) printed for medical record Attempts: 1 Following insertion, line sutured, dressing applied and Biopatch. Post procedure assessment: blood return through all ports, free fluid flow and no air  Patient tolerated the procedure well with no immediate complications.

## 2020-02-11 NOTE — Discharge Summary (Signed)
Physician Discharge Summary  Patient ID: [name] MRN: [id number] DOB/AGE: Aug 24, 1956 64 y.o.  Admit date: 02/08/2020 Discharge date: 02/18/2020  Admission Diagnoses: Coronary artery disease Unstable angina pectoris History of multiple percutaneous coronary interventions Chronic systolic heart failure Dyslipidemia COPD Type 2 diabetes mellitus Peripheral vascular disease S/P endovascular repair of abdominal aortic aneurysm GERD Obesity  Discharge Diagnoses:  Coronary artery disease Unstable angina pectoris S/P CABG x 4 History of multiple percutaneous coronary interventions Chronic systolic heart failure Dyslipidemia COPD Type 2 diabetes mellitus Peripheral vascular disease S/P endovascular repair of abdominal aortic aneurysm GERD Obesity   Discharged Condition: good  History of Present Illness:     Mr. Calandra is a 64 year old male with multiple medical problems including known coronary artery disease status post PTCI to the RCA and circumflex in January 2019 with drug-eluting stents.  Also has a history of systolic heart failure with ejection fraction of 35% back in 2012 that had improved to 40 to 45% by January 2019.  He also had an ICD placed in 2012 device was not replaced at its expected replacement interval.  He has a history of hypertension, type 2 diabetes mellitus, dyslipidemia, chronic obstructive pulmonary disease, gastroesophageal reflux disease, peripheral vascular disease with an abdominal aortic aneurysm repaired endovascularly in May 2019.  He has known bilateral iliac artery aneurysms as well.  He has bilateral carotid artery stenoses that he says have not reached the point of requiring intervention.  He had a positive Covid test in December 2020.  He was treated symptomatically at home and has recovered except for some persistent fatigue that may well be related to his coronary disease.  He recently presented to his cardiologist for scheduled follow-up on  February 11 at which time he described recurring chest pain with radiation to his left arm associated with dyspnea.  He reported he was taking nitroglycerin sublingually 3-4 times a week for management of the symptoms.  Further evaluation with left heart catheterization was recommended and he presented to [facility] earlier today for study.  Coronary angiography reveals severe three-vessel coronary artery disease.  We have been asked to evaluate Mr. Dragos for consideration of surgical coronary revascularization.   Hospital Course:  After review of clinical data and thorough preoperative work-up coronary bypass grafting was offered to the patient by Dr. Orvan Seen.  Patient decided to proceed with surgery.  P2 Y 12 platelet assay confirmed adequate platelet function.  Patient was taken to the operating room on 02/11/2020 where CABG x4 was carried out utilizing bilateral internal mammary arteries and endoscopically harvested saphenous vein from the right lower extremity. A modified Cox MAZE procedure was also performed.   Following the procedures, he separated from cardiopulmonary bypass without difficulty.  He was transferred to the cardiovascular ICU in stable condition.  He was extubated routinely and weaned from vasopressor support without difficulty.  He was loaded with amiodarone prophylactically after surgery.  He was mobilized with the assistance of physical therapy and made satisfactory progress. He was transferred to 4E progressive care on post-op day 3.  He has continued to make good progress overall.  He did have postoperative atrial fibrillation but has subsequently been chemically cardioverted back to sinus rhythm with amiodarone.  He has required both magnesium and potassium replacement as well.  He is showing steady improvement in his overall volume overload with IV Lasix.  It is transitioned at time of discharge to oral dosing.  His blood sugars have been under adequate control and he  is  transition to his home medication regimen.  Oxygen has been weaned and he maintains good saturations on room air.  He does have some mild orthopnea but saturations are good with ambulation.  He does have an elevated bed at home as well as a recliner that he plans to sleep in for the immediate future.  His incisions are noted to be healing well without evidence of infection.  He is tolerating gradually increasing activities using standard cardiac rehab modalities.  He has a very stable acute blood loss anemia with most recent hemoglobin hematocrit 10.1 and 30.1 respectively.  Renal function is within normal limits.  At the time of discharge the patient is felt to be quite stable.   Consults: Cardiology  Significant Diagnostic Studies:   LEFT HEART CATH AND CORONARY ANGIOGRAPHY  Conclusion    Prox RCA-1 lesion is 99% stenosed.  Prox RCA-2 lesion is 99% stenosed with 99% stenosed side branch in RV Branch.  RPDA lesion is 80% stenosed.  Prox Cx to Mid Cx lesion is 5% stenosed.  2nd Mrg lesion is 100% stenosed.  Ost Cx lesion is 70% stenosed.  1st Diag lesion is 65% stenosed.  Mid LAD-1 lesion is 40% stenosed.  Mid LAD-2 lesion is 75% stenosed.  LV end diastolic pressure is normal.   Severe multivessel CAD with the culprit lesion being a large dominant RCA with 99% proximal stenosis and 99% stenosis in the proximal portion of the previously placed mid RCA stent.  There is also a large PDA vessel with diffuse proximal 80% stenosis with a "flush and fill "phenomena due to competitive filling via left to right collaterals.  Significant concomitant CAD with diffuse 60+ percent stenosis in a large proximal diagonal vessel with 40% stenosis in the LAD proximal to a large septal perforating artery.  The LAD after the septal perforating artery on a band has 75% ostial stenosis.  The left circumflex vessel has 70% ostial stenosis.  The stent in the mid circumflex is patent although the superior  branch beyond the stent is totally occluded.  There is left to left collaterals supplying this branch.  Moderately severe LV dysfunction with EF estimate approximately 35% with significant hypokinesis involving the inferior wall.  LVEDP 12 mm.  RECOMMENDATION: Angiograms were reviewed with Dr. Tamala Julian in the laboratory.  With the patient's significant multivessel CAD with good distal targets, recommend initial surgical consultation for consideration of CABG revascularization surgery.  Will need Plavix washout.  Will start heparin 8 hours post sheath removal in light of the subtotal ulcerated plaque in the RCA.   ECHOCARDIOGRAM REPORT       Patient Name:  ZAHEER SZCZESNIAK Date of Exam: 02/08/2020  Medical Rec #: [id number]   Height:    69.0 in  Accession #:  [id number]  Weight:    288.0 lb  Date of Birth: 1956-03-07  BSA:     2.41 m  Patient Age:  56 years   BP:      149/64 mmHg  Patient Gender: M       HR:      64 bpm.  Exam Location: Inpatient   Procedure: 2D Echo   Indications:  CAD Native Vessel 414.01/I25.10    History:    Patient has prior history of Echocardiogram examinations,  most         recent 08/28/2018. CHF, CAD, COPD; Risk  Factors:Hypertension,         Diabetes, Dyslipidemia, Sleep Apnea and Former Smoker.  Sonographer:  Clayton Lefort RDCS (AE)  Referring Phys: Wallace Comments: Patient is morbidly obese and suboptimal subcostal  window.  IMPRESSIONS    1. Left ventricular ejection fraction, by estimation, is 45 to 50%. The  left ventricle has mildly decreased function. The left ventricle  demonstrates global hypokinesis. The left ventricular internal cavity size  was moderately dilated. There is moderate  left ventricular hypertrophy. Left ventricular diastolic parameters are  consistent with Grade I diastolic dysfunction (impaired relaxation).  2. Right  ventricular systolic function is normal. The right ventricular  size is normal.  3. The mitral valve is normal in structure and function. No evidence of  mitral valve regurgitation. No evidence of mitral stenosis.  4. The aortic valve is normal in structure and function. Aortic valve  regurgitation is not visualized. No aortic stenosis is present.  5. The inferior vena cava is normal in size with greater than 50%  respiratory variability, suggesting right atrial pressure of 3 mmHg.   Comparison(s): Prior images unable to be directly viewed, comparison made  by report only. No significant change from prior study.   FINDINGS  Left Ventricle: Left ventricular ejection fraction, by estimation, is 45  to 50%. The left ventricle has mildly decreased function. The left  ventricle demonstrates global hypokinesis. The left ventricular internal  cavity size was moderately dilated.  There is moderate left ventricular hypertrophy. Left ventricular diastolic  parameters are consistent with Grade I diastolic dysfunction (impaired  relaxation).   Right Ventricle: The right ventricular size is normal. No increase in  right ventricular wall thickness. Right ventricular systolic function is  normal.   Left Atrium: Left atrial size was normal in size.   Right Atrium: Right atrial size was normal in size.   Pericardium: There is no evidence of pericardial effusion.   Mitral Valve: The mitral valve is normal in structure and function. Normal  mobility of the mitral valve leaflets. No evidence of mitral valve  regurgitation. No evidence of mitral valve stenosis. MV peak gradient, 3.3  mmHg. The mean mitral valve gradient  is 1.0 mmHg.   Tricuspid Valve: The tricuspid valve is normal in structure. Tricuspid  valve regurgitation is not demonstrated. No evidence of tricuspid  stenosis.   Aortic Valve: The aortic valve is normal in structure and function. Aortic  valve regurgitation is not  visualized. No aortic stenosis is present.  Aortic valve mean gradient measures 7.0 mmHg. Aortic valve peak gradient  measures 11.7 mmHg. Aortic valve  area, by VTI measures 1.58 cm.   Pulmonic Valve: The pulmonic valve was normal in structure. Pulmonic valve  regurgitation is not visualized. No evidence of pulmonic stenosis.   Aorta: The aortic root is normal in size and structure.   Venous: The inferior vena cava is normal in size with greater than 50%  respiratory variability, suggesting right atrial pressure of 3 mmHg.   IAS/Shunts: No atrial level shunt detected by color flow Doppler.    Treatments:   CARDIOTHORACIC SURGERY OPERATIVE NOTE  Date of Procedure:    02/11/2020  Preoperative Diagnosis:      Severe 3-vessel Coronary Artery Disease and unstable angina  Postoperative Diagnosis:    Same  Procedure:        Bilateral pulmonary vein isolation (limited Maze procedure) with left atrial appendage clipping (35 mm Pro-V)  Coronary Artery Bypass Grafting x 4              Left  Internal Mammary Artery to Distal Left Anterior Descending Coronary Artery; pedicled RIMA Graft to right PosterolateralCoronary Artery; Saphenous Vein Graft to Obtuse Marginal Branch of Left Circumflex Coronary Artery, Sapheonous Vein Graft to Diagonal Branch Coronary Artery; Endoscopic Vein Harvest from right Thigh and Lower Leg; bilateral IMA harvesting Completion indocyanine green fluorescence imaging (SPY) Multilevel rib block with Exparel solution Rigid sternal reconstruction with linear plating system Application of Prevena incisional management system on closed incision   Surgeon:        B. Murvin Natal, MD  Assistant:       Macarthur Critchley PA-C  Anesthesia:    get  Operative Findings: ? preserved left ventricular and right ventricular systolic function ? good quality internal mammary artery conduits ? Good quality saphenous vein conduit ? fair quality target vessels for  grafting    BRIEF CLINICAL NOTE AND INDICATIONS FOR SURGERY  64 yo man with known CAD, s/p PCI in 1/19 presented with progressive sx of chest pain. Given multiple risk factors for CAD and his past hx, he underwent LHC which showed progression of disease, now diffuse and involving all important coronary arteries. Referral was placed for CABG. He has undergone a thorough work-up and is considered a good candidate for CABG.   Discharge Exam: Blood pressure 110/77, pulse 78, temperature 98 F (36.7 C), temperature source Oral, resp. rate 19, height 5\' 9"  (1.753 m), weight 127.7 kg, SpO2 96 %.  General appearance:alert, cooperative and no distress Neurologic:intact Heart: regular rate and rhythm Lungs:Breath sounds clear. Abdomen:Soft, non-tender Extremities:Warm, well perfused. Mild pre-tibial edema. Wound:The sternal dressing was removed. The incision is dry and well approximated with staples.    Disposition: Discharge disposition: 01-Home or Self Care       Discharge Instructions    Discharge patient   Complete by: As directed    Discharge disposition: 01-Home or Self Care   Discharge patient date: 02/18/2020     Allergies as of 02/18/2020   No Known Allergies     Medication List    STOP taking these medications   bisoprolol 10 MG tablet Commonly known as: ZEBETA   carvedilol 3.125 MG tablet Commonly known as: COREG   isosorbide mononitrate 60 MG 24 hr tablet Commonly known as: IMDUR   nitroGLYCERIN 0.4 MG SL tablet Commonly known as: Nitrostat   ondansetron 4 MG tablet Commonly known as: ZOFRAN   spironolactone 25 MG tablet Commonly known as: ALDACTONE     TAKE these medications   acetaminophen 500 MG tablet Commonly known as: TYLENOL Take 1,000 mg by mouth every 6 (six) hours as needed (for pain.).   amiodarone 400 MG tablet Commonly known as: PACERONE Take 1 tablet (400 mg total) by mouth 2 (two) times daily. For 7 days then take 400 mg  daily   aspirin 81 MG chewable tablet Chew 1 tablet (81 mg total) by mouth daily.   atorvastatin 80 MG tablet Commonly known as: LIPITOR Take 1 tablet (80 mg total) by mouth daily at 6 PM. What changed: See the new instructions.   clopidogrel 75 MG tablet Commonly known as: PLAVIX Take 1 tablet (75 mg total) by mouth daily.   colchicine 0.6 MG tablet Take 1 tablet (0.6 mg total) by mouth daily.   furosemide 80 MG tablet Commonly known as: LASIX Take 1 tablet (80 mg total) by mouth daily.   glipiZIDE 5 MG tablet Commonly known as: GLUCOTROL Take 1 tablet (5 mg total) by mouth 2 (two) times daily before a meal.  losartan 25 MG tablet Commonly known as: COZAAR Take 1 tablet (25 mg total) by mouth daily.   magnesium oxide 400 (241.3 Mg) MG tablet Commonly known as: MAG-OX Take 2 tablets (800 mg total) by mouth 2 (two) times daily.   metFORMIN 500 MG 24 hr tablet Commonly known as: GLUCOPHAGE-XR TAKE 2 TABLETS BY MOUTH EVERY DAY WITH BREAKFAST What changed: See the new instructions.   metoprolol tartrate 25 MG tablet Commonly known as: LOPRESSOR Take 1 tablet (25 mg total) by mouth 2 (two) times daily.   multivitamin with minerals Tabs tablet Take 1 tablet by mouth daily.   pantoprazole 40 MG tablet Commonly known as: PROTONIX Take 1 tablet (40 mg total) by mouth at bedtime.   potassium chloride SA 20 MEQ tablet Commonly known as: KLOR-CON Take 1 tablet (20 mEq total) by mouth 2 (two) times daily.   tiotropium 18 MCG inhalation capsule Commonly known as: SPIRIVA Place 1 capsule (18 mcg total) into inhaler and inhale daily.   traMADol 50 MG tablet Commonly known as: ULTRAM Take 1 tablet (50 mg total) by mouth every 6 (six) hours as needed for up to 7 days for moderate pain.   Ventolin HFA 108 (90 Base) MCG/ACT inhaler Generic drug: albuterol TAKE 2 PUFFS BY MOUTH EVERY 6 HOURS AS NEEDED FOR WHEEZE OR SHORTNESS OF BREATH *USE W/ SPACER* What changed: See the  new instructions.   vitamin B-12 1000 MCG tablet Commonly known as: CYANOCOBALAMIN Take 1,000 mcg by mouth daily.            Durable Medical Equipment  (From admission, onward)         Start     Ordered   02/16/20 1641  For home use only DME Walker rolling  Once    Question Answer Comment  Walker: Other   Comments rollator with seat   Patient needs a walker to treat with the following condition Physical deconditioning      02/16/20 1641         Follow-up Information    Minus Breeding, MD. Go on 03/02/2020.   Specialty: Cardiology Why: You have a cardiology follow up appointment with Dr. Percival Spanish on Thursday, 03/02/20 at 2:40pm.  Contact information: [address] [place] [address] [phone]        Wonda Olds, MD. Go on 02/28/2020.   Specialty: Cardiothoracic Surgery Why: You have an appointment with Dr. Orvan Seen on Monday 02/28/20 at 12 noon. Please arrive 30 minutes early for a chest x-ray to be performed by [facility] located on the first floor of the same building.  Contact information: [address] [place]  [phone]          The patient has been discharged on:   1.Beta Blocker:  Yes [ y  ]                              No   [   ]                              If No, reason:  2.Ace Inhibitor/ARB: Yes Blue.Reese   ]                                     No  [    ]  If No, reason:  3.Statin:   Yes [ y  ]                  No  [   ]                  If No, reason:  4.Ecasa:  Yes  [ y  ]                  No   [   ]                  If No, reason:     Signed: Le Giovanni PA-C 02/18/2020, 9:06 AM

## 2020-02-11 NOTE — Anesthesia Procedure Notes (Signed)
Central Venous Catheter Insertion Performed by: Suzette Battiest, MD, anesthesiologist Start/End2/19/2021 7:05 AM, 02/11/2020 7:15 AM Patient location: Pre-op. Preanesthetic checklist: patient identified, IV checked, site marked, risks and benefits discussed, surgical consent, monitors and equipment checked, pre-op evaluation, timeout performed and anesthesia consent Hand hygiene performed  and maximum sterile barriers used  PA cath was placed.Swan type:thermodilution PA Cath depth:50 Procedure performed without using ultrasound guided technique. Attempts: 1 Patient tolerated the procedure well with no immediate complications.

## 2020-02-11 NOTE — Transfer of Care (Signed)
Immediate Anesthesia Transfer of Care Note  Patient: [name]  Procedure(s) Performed: CORONARY ARTERY BYPASS GRAFTING (CABG) using LIMA to LAD; RIMA to PL; Endoscopic right greater saphenous vein harvest: SVC to Diag; SVC to OM1. (N/A Chest) TRANSESOPHAGEAL ECHOCARDIOGRAM (TEE) (N/A ) INDOCYANINE GREEN FLUORESCENCE IMAGING (ICG) (N/A ) Clipping Of Atrial Appendage using AtriCure 35 MM AtriClip. (N/A Chest) Limited Maze Procedure using AtriCure Isolator Synergy Clamp (N/A Chest) Endovein Harvest Of Greater Saphenous Vein using right lower extremity. (Right Leg Upper)  Patient Location: ICU  Anesthesia Type:General  Level of Consciousness: Patient remains intubated per anesthesia plan  Airway & Oxygen Therapy: Patient remains intubated per anesthesia plan and Patient placed on Ventilator (see vital sign flow sheet for setting)  Post-op Assessment: Report given to RN  Post vital signs: Reviewed and stable  Last Vitals:  Vitals Value Taken Time  BP 118/65 02/11/20 1426  Temp 36.1 C 02/11/20 1431  Pulse 88 02/11/20 1431  Resp 15 02/11/20 1431  SpO2 93 % 02/11/20 1431  Vitals shown include unvalidated device data.  Last Pain:  Vitals:   02/11/20 0442  TempSrc: Oral  PainSc:          Complications: No apparent anesthesia complications

## 2020-02-11 NOTE — Progress Notes (Signed)
Pre-extubation ABG as follows. Per MD Roxan Hockey pt can still wean to extubate w/pH of 7.293. RT will continue to monitor.   Results for [name], [name] (MRN [id number]) as of 02/11/2020 21:10  Ref. Range 02/11/2020 18:44  Sample type Unknown ARTERIAL  pH, Arterial Latest Ref Range: 7.350 - 7.450  7.293 (L)  pCO2 arterial Latest Ref Range: 32.0 - 48.0 mmHg 44.3  pO2, Arterial Latest Ref Range: 83.0 - 108.0 mmHg 90.0  TCO2 Latest Ref Range: 22 - 32 mmol/L 23  Acid-base deficit Latest Ref Range: 0.0 - 2.0 mmol/L 5.0 (H)  Bicarbonate Latest Ref Range: 20.0 - 28.0 mmol/L 21.3  O2 Saturation Latest Units: % 96.0  Patient temperature Unknown 37.5 C

## 2020-02-11 NOTE — Discharge Instructions (Signed)

## 2020-02-11 NOTE — Anesthesia Procedure Notes (Addendum)
Procedure Name: Intubation Date/Time: 02/11/2020 7:53 AM Performed by: Barrington Ellison, CRNA Pre-anesthesia Checklist: Patient identified, Emergency Drugs available, Suction available and Patient being monitored Patient Re-evaluated:Patient Re-evaluated prior to induction Oxygen Delivery Method: Circle System Utilized Preoxygenation: Pre-oxygenation with 100% oxygen Induction Type: IV induction Ventilation: Mask ventilation without difficulty, Oral airway inserted - appropriate to patient size and Two handed mask ventilation required Laryngoscope Size: Mac and 4 Grade View: Grade I Tube type: Oral Tube size: 8.0 mm Number of attempts: 1 Airway Equipment and Method: Stylet and Oral airway Placement Confirmation: ETT inserted through vocal cords under direct vision,  positive ETCO2 and breath sounds checked- equal and bilateral Secured at: 23 cm Tube secured with: Tape Dental Injury: Teeth and Oropharynx as per pre-operative assessment

## 2020-02-11 NOTE — Op Note (Signed)
CARDIOTHORACIC SURGERY OPERATIVE NOTE  Date of Procedure: 02/11/2020  Preoperative Diagnosis: Severe 3-vessel Coronary Artery Disease and unstable angina  Postoperative Diagnosis: Same  Procedure:    Bilateral pulmonary vein isolation (limited Maze procedure) with left atrial appendage clipping (35 mm Pro-V)  Coronary Artery Bypass Grafting x 4   Left Internal Mammary Artery to Distal Left Anterior Descending Coronary Artery; pedicled RIMA Graft to right PosterolateralCoronary Artery; Saphenous Vein Graft to Obtuse Marginal Branch of Left Circumflex Coronary Artery, Sapheonous Vein Graft to Diagonal Branch Coronary Artery; Endoscopic Vein Harvest from right Thigh and Lower Leg; bilateral IMA harvesting Completion indocyanine green fluorescence imaging (SPY) Multilevel rib block with Exparel solution Rigid sternal reconstruction with linear plating system Application of Prevena incisional management system on closed incision   Surgeon: B. Murvin Natal, MD  Assistant: Macarthur Critchley PA-C  Anesthesia: get  Operative Findings:  preserved left ventricular and right ventricular systolic function  good quality internal mammary artery conduits  Good quality saphenous vein conduit  fair quality target vessels for grafting    BRIEF CLINICAL NOTE AND INDICATIONS FOR SURGERY  64 yo man with known CAD, s/p PCI in 1/19 presented with progressive sx of chest pain. Given multiple risk factors for CAD and his past hx, he underwent LHC which showed progression of disease, now diffuse and involving all important coronary arteries. Referral was placed for CABG. He has undergone a thorough work-up and is considered a good candidate for CABG.    DETAILS OF THE OPERATIVE PROCEDURE  Preparation:  The patient is brought to the operating room on the above mentioned date and central monitoring was established by the anesthesia team including placement of Swan-Ganz catheter and radial arterial  line. The patient is placed in the supine position on the operating table.  Intravenous antibiotics are administered. General endotracheal anesthesia is induced uneventfully. A Foley catheter is placed.  Baseline transesophageal echocardiogram was performed.  Findings were notable for LV hypertrophy and dilation of LV as well as left atrium. No significant valvular disorders  The patient's chest, abdomen, both groins, and both lower extremities are prepared and draped in a sterile manner. A time out procedure is performed.   Surgical Approach and Conduit Harvest:  A median sternotomy incision was performed and the left internal mammary artery is dissected from the chest wall and prepared for bypass grafting. The left internal mammary artery is notably good quality conduit. Simultaneously, the greater saphenous vein is obtained from the patient's right thigh and lower leg using endoscopic vein harvest technique. The saphenous vein is notably good quality conduit. After removal of the saphenous vein, the small surgical incisions in the lower extremity are closed with absorbable suture. Attention is turned to the right chest wall, where the RIMA was harvested in a standard fashion. Following systemic heparinization, botht internal mammary arteries were transected and distally noted to have excellent flow. Both IMA grafts were treated with papaverine solution. Exparel solution was injected into the rib spaces on each side of the chest to create a multilevel rib block.   Extracorporeal Cardiopulmonary Bypass and Myocardial Protection:  The pericardium is opened. The ascending aorta is nondiseased in appearance. The ascending aorta and the right atrium are cannulated for cardiopulmonary bypass.  Adequate heparinization is verified.   A retrograde cardioplegia cannula is placed through the right atrium into the coronary sinus.  The entire pre-bypass portion of the operation was notable for stable  hemodynamics.  Cardiopulmonary bypass was begun and the surface of the heart  is inspected. Distal target vessels are selected for coronary artery bypass grafting. A cardioplegia cannula is placed in the ascending aorta.  The patient is allowed to cool passively to 34C systemic temperature.    Based on patient risk factors that include diabetes and obesity, and on more specific cardiac pathology, he is considered to have a very high risk for atrial fibrillation after surgery. Therefore, the decision is made to perform bilateral pulmonary vein isolation and left atrial appendage clip. The right sided pulmonary veins are encircled and treated with RFA energy.  The aortic cross clamp is applied and cold blood cardioplegia is delivered initially in an antegrade fashion through the aortic root.   Supplemental cardioplegia is given retrograde through the coronary sinus catheter.  Iced saline slush is applied for topical hypothermia.  The initial cardioplegic arrest is rapid with early diastolic arrest.  Repeat doses of cardioplegia are administered intermittently throughout the entire cross clamp portion of the operation through the aortic root,  through the coronary sinus catheter, and through subsequently placed vein grafts in order to maintain completely flat electrocardiogram.   The left sided pulmonary veins are encircled after dividing the ligament of Marshall. RFA energy is applied to the left sided veins. The left atrial appendage is transected and the RFA clamp is placed inside the LA and onto the left superior pulmonary vein. A Pro-V atrial clip is then applied to the base of the appendage after completing the RFA lesions.    Coronary Artery Bypass Grafting:   The  diagonal branch of the left anterior descending coronary artery was grafted using a reversed saphenous vein graft in an end-to-side fashion.  At the site of distal anastomosis the target vessel was fair quality and measured approximately  1.5 mm in diameter.  The  obtuse marginal branch of the left circumflex coronary artery was grafted using a reversed saphenous vein graft in an end-to-side fashion.  At the site of distal anastomosis the target vessel was poor quality and measured approximately 1.5 mm in diameter.  The distal left anterior coronary artery was grafted with the left internal mammary artery in an end-to-side fashion.  At the site of distal anastomosis the target vessel was good quality and measured approximately 2 mm in diameter.  The  Posterolateral branch of the right coronary artery was grafted using the pedicled RIMA graft in an end-to-side fashion.  At the site of distal anastomosis the target vessel was good quality and measured approximately 1.5 mm in diameter.  All proximal vein graft anastomoses were placed directly to the ascending aorta prior to removal of the aortic cross clamp.  Deairing procedures were performed and the aortic cross clamp was removed.  Procedure Completion:  All proximal and distal coronary anastomoses were inspected for hemostasis and appropriate graft orientation. Epicardial pacing wires are fixed to the right ventricular outflow tract and to the right atrial appendage. The patient is rewarmed to 37C temperature. The patient is weaned and disconnected from cardiopulmonary bypass.  The patient's rhythm at separation from bypass was NSR.  The patient was weaned from cardiopulmonary bypass  without any inotropic support.  Followup transesophageal echocardiogram performed after separation from bypass revealed no changes from the preoperative exam.  The aortic and venous cannula were removed uneventfully. Protamine was administered to reverse the anticoagulation. The mediastinum and pleural space were inspected for hemostasis and irrigated with saline solution. The mediastinum and bilateral pleural space were drained using fluted chest tubes placed through separate stab incisions inferiorly.  The soft tissues anterior to the aorta were reapproximated loosely. The sternum is closed with linear plating system on each side of the sternum and then with double strength sternal wire to create a rigid sternal closure. The soft tissues anterior to the sternum were closed in multiple layers and the skin is closed with a running subcuticular skin closure.  The post-bypass portion of the operation was notable for stable rhythm and hemodynamics.  No blood products were administered during the operation.   Disposition:  The patient tolerated the procedure well and is transported to the surgical intensive care in stable condition. There are no intraoperative complications. All sponge instrument and needle counts are verified correct at completion of the operation.    Jayme Cloud, MD 02/11/2020 3:23 PM

## 2020-02-11 NOTE — H&P (Signed)
History and Physical Interval Note:  02/11/2020 7:23 AM  [name]  has presented today for surgery, with the diagnosis of CAD.  The various methods of treatment have been discussed with the patient and family. After consideration of risks, benefits and other options for treatment, the patient has consented to  Procedure(s) with comments: CORONARY ARTERY BYPASS GRAFTING (CABG) (N/A) - BILATERAL IMA RADIAL ARTERY HARVEST (Bilateral) TRANSESOPHAGEAL ECHOCARDIOGRAM (TEE) (N/A) INDOCYANINE GREEN FLUORESCENCE IMAGING (ICG) (N/A) as a surgical intervention.  The patient's history has been reviewed, patient examined, no change in status, stable for surgery.  I have reviewed the patient's chart and labs.  Questions were answered to the patient's satisfaction.     [name]

## 2020-02-11 NOTE — Procedures (Addendum)
Extubation Procedure Note  Patient Details:   Name: [name] DOB: September 27, 1956 MRN: [id number]   Airway Documentation:    Vent end date: 02/11/20 Vent end time: 2054   Evaluation  O2 sats: stable throughout Complications: No apparent complications Patient did tolerate procedure well. Bilateral Breath Sounds: Rhonchi, Diminished   Yes  Pt had cuff leak, able to speak name, effective cough present at extubation, no stridor noted, pt NIF -30 VC .75 L, pt able to perform IS post extubation. Pt placed on [place] 4Lpm humidified. Pts respiratory status is stable at this time. RT will continue to monitor.   [name] [name] 02/11/2020, 9:17 PM

## 2020-02-11 NOTE — Progress Notes (Signed)
  Echocardiogram Echocardiogram Transesophageal has been performed.  [name] 02/11/2020, 8:38 AM

## 2020-02-11 NOTE — Anesthesia Procedure Notes (Signed)
Arterial Line Insertion Start/End2/19/2021 7:10 AM, 02/11/2020 7:15 AM Performed by: Barrington Ellison, CRNA, CRNA  Patient location: Pre-op. Preanesthetic checklist: patient identified, IV checked, risks and benefits discussed and pre-op evaluation Lidocaine 1% used for infiltration and patient sedated Left, radial was placed Catheter size: 20 G Hand hygiene performed  and maximum sterile barriers used  Allen's test indicative of satisfactory collateral circulation Attempts: 1 Procedure performed without using ultrasound guided technique. Following insertion, dressing applied and Biopatch. Post procedure assessment: normal  Patient tolerated the procedure well with no immediate complications.

## 2020-02-11 NOTE — Progress Notes (Signed)
Pt had good effort w/NIF -30 and VC .75 L. RT will continue to monitor.

## 2020-02-12 ENCOUNTER — Inpatient Hospital Stay (HOSPITAL_COMMUNITY): Payer: BC Managed Care – PPO

## 2020-02-12 DIAGNOSIS — Z8616 Personal history of COVID-19: Secondary | ICD-10-CM | POA: Diagnosis not present

## 2020-02-12 DIAGNOSIS — E785 Hyperlipidemia, unspecified: Secondary | ICD-10-CM | POA: Diagnosis not present

## 2020-02-12 DIAGNOSIS — I70201 Unspecified atherosclerosis of native arteries of extremities, right leg: Secondary | ICD-10-CM | POA: Diagnosis not present

## 2020-02-12 DIAGNOSIS — J9811 Atelectasis: Secondary | ICD-10-CM | POA: Diagnosis not present

## 2020-02-12 DIAGNOSIS — E1151 Type 2 diabetes mellitus with diabetic peripheral angiopathy without gangrene: Secondary | ICD-10-CM | POA: Diagnosis not present

## 2020-02-12 DIAGNOSIS — I252 Old myocardial infarction: Secondary | ICD-10-CM | POA: Diagnosis not present

## 2020-02-12 DIAGNOSIS — I25110 Atherosclerotic heart disease of native coronary artery with unstable angina pectoris: Secondary | ICD-10-CM | POA: Diagnosis not present

## 2020-02-12 DIAGNOSIS — I4819 Other persistent atrial fibrillation: Secondary | ICD-10-CM | POA: Diagnosis not present

## 2020-02-12 DIAGNOSIS — I5022 Chronic systolic (congestive) heart failure: Secondary | ICD-10-CM | POA: Diagnosis not present

## 2020-02-12 DIAGNOSIS — I428 Other cardiomyopathies: Secondary | ICD-10-CM | POA: Diagnosis not present

## 2020-02-12 DIAGNOSIS — D62 Acute posthemorrhagic anemia: Secondary | ICD-10-CM | POA: Diagnosis not present

## 2020-02-12 DIAGNOSIS — Z6841 Body Mass Index (BMI) 40.0 and over, adult: Secondary | ICD-10-CM | POA: Diagnosis not present

## 2020-02-12 LAB — GLUCOSE, CAPILLARY
Glucose-Capillary: 140 mg/dL — ABNORMAL HIGH (ref 70–99)
Glucose-Capillary: 148 mg/dL — ABNORMAL HIGH (ref 70–99)
Glucose-Capillary: 157 mg/dL — ABNORMAL HIGH (ref 70–99)
Glucose-Capillary: 164 mg/dL — ABNORMAL HIGH (ref 70–99)
Glucose-Capillary: 176 mg/dL — ABNORMAL HIGH (ref 70–99)
Glucose-Capillary: 179 mg/dL — ABNORMAL HIGH (ref 70–99)
Glucose-Capillary: 185 mg/dL — ABNORMAL HIGH (ref 70–99)
Glucose-Capillary: 188 mg/dL — ABNORMAL HIGH (ref 70–99)
Glucose-Capillary: 195 mg/dL — ABNORMAL HIGH (ref 70–99)
Glucose-Capillary: 233 mg/dL — ABNORMAL HIGH (ref 70–99)

## 2020-02-12 LAB — BASIC METABOLIC PANEL
Anion gap: 9 (ref 5–15)
Anion gap: 11 (ref 5–15)
BUN: 10 mg/dL (ref 8–23)
BUN: 10 mg/dL (ref 8–23)
CO2: 24 mmol/L (ref 22–32)
CO2: 24 mmol/L (ref 22–32)
Calcium: 7.8 mg/dL — ABNORMAL LOW (ref 8.9–10.3)
Calcium: 8 mg/dL — ABNORMAL LOW (ref 8.9–10.3)
Chloride: 103 mmol/L (ref 98–111)
Chloride: 98 mmol/L (ref 98–111)
Creatinine, Ser: 0.9 mg/dL (ref 0.61–1.24)
Creatinine, Ser: 0.87 mg/dL (ref 0.61–1.24)
GFR calc Af Amer: >60 mL/min (ref >60)
GFR calc Af Amer: >60 mL/min (ref >60)
GFR calc non Af Amer: >60 mL/min (ref >60)
GFR calc non Af Amer: >60 mL/min (ref >60)
Glucose, Bld: 179 mg/dL — ABNORMAL HIGH (ref 70–99)
Glucose, Bld: 227 mg/dL — ABNORMAL HIGH (ref 70–99)
Potassium: 5.1 mmol/L (ref 3.5–5.1)
Potassium: 4.5 mmol/L (ref 3.5–5.1)
Sodium: 136 mmol/L (ref 135–145)
Sodium: 133 mmol/L — ABNORMAL LOW (ref 135–145)

## 2020-02-12 LAB — CBC
HCT: 32.7 % — ABNORMAL LOW (ref 39.0–52.0)
HCT: 33.7 % — ABNORMAL LOW (ref 39.0–52.0)
Hemoglobin: 10.9 g/dL — ABNORMAL LOW (ref 13.0–17.0)
Hemoglobin: 11.1 g/dL — ABNORMAL LOW (ref 13.0–17.0)
MCH: 28 pg (ref 26.0–34.0)
MCH: 28.2 pg (ref 26.0–34.0)
MCHC: 33.3 g/dL (ref 30.0–36.0)
MCHC: 32.9 g/dL (ref 30.0–36.0)
MCV: 84.1 fL (ref 80.0–100.0)
MCV: 85.8 fL (ref 80.0–100.0)
Platelets: 240 10*3/uL (ref 150–400)
Platelets: 251 10*3/uL (ref 150–400)
RBC: 3.89 MIL/uL — ABNORMAL LOW (ref 4.22–5.81)
RBC: 3.93 MIL/uL — ABNORMAL LOW (ref 4.22–5.81)
RDW: 14.7 % (ref 11.5–15.5)
RDW: 15.1 % (ref 11.5–15.5)
WBC: 9.5 10*3/uL (ref 4.0–10.5)
WBC: 9.5 10*3/uL (ref 4.0–10.5)
nRBC: 0 % (ref 0.0–0.2)
nRBC: 0 % (ref 0.0–0.2)

## 2020-02-12 LAB — MAGNESIUM
Magnesium: 2.2 mg/dL (ref 1.7–2.4)
Magnesium: 2.2 mg/dL (ref 1.7–2.4)

## 2020-02-12 MED ORDER — DIAZEPAM 2 MG PO TABS: 2.0000 mg | ORAL_TABLET | Freq: Three times a day (TID) | ORAL | Status: DC

## 2020-02-12 MED ORDER — INSULIN DETEMIR 100 UNIT/ML ~~LOC~~ SOLN
20.0000 [IU] | Freq: Two times a day (BID) | SUBCUTANEOUS | Status: DC
  Administered 2020-02-12 (×2): 20 [IU] via SUBCUTANEOUS
  Filled 2020-02-12 (×4): qty 0.2

## 2020-02-12 MED ORDER — COLCHICINE 0.6 MG PO TABS
0.6000 mg | ORAL_TABLET | Freq: Every day | ORAL | Status: DC
  Administered 2020-02-12 – 2020-02-18 (×7): 0.6 mg via ORAL
  Filled 2020-02-12 (×7): qty 1

## 2020-02-12 MED ORDER — DIAZEPAM 2 MG PO TABS: 2.0000 mg | ORAL_TABLET | Freq: Three times a day (TID) | ORAL | Status: DC | PRN

## 2020-02-12 MED ORDER — INSULIN ASPART 100 UNIT/ML ~~LOC~~ SOLN
2.0000 [IU] | SUBCUTANEOUS | Status: DC
  Administered 2020-02-12 (×2): 4 [IU] via SUBCUTANEOUS
  Administered 2020-02-12: 6 [IU] via SUBCUTANEOUS
  Administered 2020-02-13: 4 [IU] via SUBCUTANEOUS
  Administered 2020-02-13: 6 [IU] via SUBCUTANEOUS

## 2020-02-12 NOTE — Progress Notes (Signed)
      [address]       [place] [address]             [phone]      POD # 1 CABG, LAA clip, PV isolation  Up in chair  BP 102/66   Pulse 89   Temp 98.2 F (36.8 C) (Oral)   Resp (!) 36   Ht 5\' 9"  (1.753 m)   Wt 134.5 kg   SpO2 99%   BMI 43.79 kg/m   2L  98% sat  Intake/Output Summary (Last 24 hours) at 02/12/2020 1644 Last data filed at 02/12/2020 1600 Gross per 24 hour  Intake 2963.59 ml  Output 1735 ml  Net 1228.59 ml   CBG 170-190  PM labs pending  Revonda Standard. Roxan Hockey, MD [facility] [phone]

## 2020-02-12 NOTE — Plan of Care (Signed)
  Problem: Education: Goal: Knowledge of General Education information will improve Description: Including pain rating scale, medication(s)/side effects and non-pharmacologic comfort measures Outcome: Progressing   Problem: Education: Goal: Will demonstrate proper wound care and an understanding of methods to prevent future damage Outcome: Progressing Goal: Knowledge of disease or condition will improve Outcome: Progressing Goal: Knowledge of the prescribed therapeutic regimen will improve Outcome: Progressing Goal: Individualized Educational Video(s) Outcome: Progressing   Problem: Activity: Goal: Risk for activity intolerance will decrease Outcome: Progressing   Problem: Cardiac: Goal: Will achieve and/or maintain hemodynamic stability Outcome: Progressing   Problem: Clinical Measurements: Goal: Postoperative complications will be avoided or minimized Outcome: Progressing   Problem: Respiratory: Goal: Respiratory status will improve Outcome: Progressing   Problem: Skin Integrity: Goal: Wound healing without signs and symptoms of infection Outcome: Progressing Goal: Risk for impaired skin integrity will decrease Outcome: Progressing   Problem: Urinary Elimination: Goal: Ability to achieve and maintain adequate renal perfusion and functioning will improve Outcome: Progressing   

## 2020-02-12 NOTE — Progress Notes (Signed)
1 Day Post-Op Procedure(s) (LRB): CORONARY ARTERY BYPASS GRAFTING (CABG) using LIMA to LAD; RIMA to PL; Endoscopic right greater saphenous vein harvest: SVC to Diag; SVC to OM1. (N/A) TRANSESOPHAGEAL ECHOCARDIOGRAM (TEE) (N/A) INDOCYANINE GREEN FLUORESCENCE IMAGING (ICG) (N/A) Clipping Of Atrial Appendage using AtriCure 35 MM AtriClip. (N/A) Limited Maze Procedure using AtriCure Isolator Synergy Clamp (N/A) Endovein Harvest Of Greater Saphenous Vein using right lower extremity. (Right) Subjective: No complaints Objective: Vital signs in last 24 hours: Temp:  [96.8 F (36 C)-99.5 F (37.5 C)] 99 F (37.2 C) (02/20 0600) Pulse Rate:  [39-94] 92 (02/20 0600) Cardiac Rhythm: A-V Sequential paced (02/20 0400) Resp:  [10-39] 10 (02/20 0600) BP: (76-118)/(40-82) 93/70 (02/20 0600) SpO2:  [94 %-100 %] 95 % (02/20 0600) Arterial Line BP: (90-135)/(52-77) 103/60 (02/20 0600) FiO2 (%):  [40 %-50 %] 40 % (02/19 2019) Weight:  [134.5 kg] 134.5 kg (02/20 0500)  Hemodynamic parameters for last 24 hours: PAP: (24-44)/(13-24) 29/17 CO:  [3.4 L/min-6.4 L/min] 5.9 L/min CI:  [1.4 L/min/m2-2.7 L/min/m2] 2.5 L/min/m2  Intake/Output from previous day: 02/19 0701 - 02/20 0700 In: 5800.5 [I.V.:4513.5; Blood:500; IV Piggyback:787] Out: [id number] [Urine:2220; Blood:850; Chest Tube:550] Intake/Output this shift: No intake/output data recorded.  General appearance: alert and cooperative Neurologic: intact Heart: regular rate and rhythm, S1, S2 normal, no murmur, click, rub or gallop Lungs: clear to auscultation bilaterally Abdomen: soft, non-tender; bowel sounds normal; no masses,  no organomegaly Extremities: extremities normal, atraumatic, no cyanosis or edema Wound: c/d/i  Lab Results: Recent Labs    02/11/20 2030 02/11/20 2030 02/11/20 2225 02/12/20 0409  WBC 9.4  --   --  9.5  HGB 11.9*   < > 10.9* 10.9*  HCT 34.9*   < > 32.0* 32.7*  PLT 260  --   --  240   < > = values in this interval  not displayed.   BMET:  Recent Labs    02/11/20 2030 02/11/20 2030 02/11/20 2225 02/12/20 0409  NA 134*   < > 138 136  K 4.8   < > 4.7 5.1  CL 105  --   --  103  CO2 22  --   --  24  GLUCOSE 188*  --   --  179*  BUN 9  --   --  10  CREATININE 0.89  --   --  0.90  CALCIUM 7.5*  --   --  7.8*   < > = values in this interval not displayed.    PT/INR:  Recent Labs    02/11/20 1455  LABPROT 15.4*  INR 1.2   ABG    Component Value Date/Time   PHART 7.304 (L) 02/11/2020 2225   HCO3 20.7 02/11/2020 2225   TCO2 22 02/11/2020 2225   ACIDBASEDEF 5.0 (H) 02/11/2020 2225   O2SAT 93.0 02/11/2020 2225   CBG (last 3)  Recent Labs    02/12/20 0239 02/12/20 0404 02/12/20 0641  GLUCAP 148* 179* 140*    Assessment/Plan: S/P Procedure(s) (LRB): CORONARY ARTERY BYPASS GRAFTING (CABG) using LIMA to LAD; RIMA to PL; Endoscopic right greater saphenous vein harvest: SVC to Diag; SVC to OM1. (N/A) TRANSESOPHAGEAL ECHOCARDIOGRAM (TEE) (N/A) INDOCYANINE GREEN FLUORESCENCE IMAGING (ICG) (N/A) Clipping Of Atrial Appendage using AtriCure 35 MM AtriClip. (N/A) Limited Maze Procedure using AtriCure Isolator Synergy Clamp (N/A) Endovein Harvest Of Greater Saphenous Vein using right lower extremity. (Right) Mobilize adjust meds; ok to remove PA cath and arterial line  Continue amiodarone load     LOS:  4 days    [name] 02/12/2020

## 2020-02-13 ENCOUNTER — Inpatient Hospital Stay (HOSPITAL_COMMUNITY): Payer: BC Managed Care – PPO

## 2020-02-13 DIAGNOSIS — U071 COVID-19: Secondary | ICD-10-CM | POA: Diagnosis not present

## 2020-02-13 DIAGNOSIS — J9811 Atelectasis: Secondary | ICD-10-CM | POA: Diagnosis not present

## 2020-02-13 LAB — CBC
HCT: 31.3 % — ABNORMAL LOW (ref 39.0–52.0)
Hemoglobin: 10.4 g/dL — ABNORMAL LOW (ref 13.0–17.0)
MCH: 28 pg (ref 26.0–34.0)
MCHC: 33.2 g/dL (ref 30.0–36.0)
MCV: 84.4 fL (ref 80.0–100.0)
Platelets: 200 10*3/uL (ref 150–400)
RBC: 3.71 MIL/uL — ABNORMAL LOW (ref 4.22–5.81)
RDW: 15.1 % (ref 11.5–15.5)
WBC: 7.6 10*3/uL (ref 4.0–10.5)
nRBC: 0 % (ref 0.0–0.2)

## 2020-02-13 LAB — BASIC METABOLIC PANEL
Anion gap: 8 (ref 5–15)
BUN: 8 mg/dL (ref 8–23)
CO2: 26 mmol/L (ref 22–32)
Calcium: 8.1 mg/dL — ABNORMAL LOW (ref 8.9–10.3)
Chloride: 99 mmol/L (ref 98–111)
Creatinine, Ser: 0.73 mg/dL (ref 0.61–1.24)
GFR calc Af Amer: >60 mL/min (ref >60)
GFR calc non Af Amer: >60 mL/min (ref >60)
Glucose, Bld: 193 mg/dL — ABNORMAL HIGH (ref 70–99)
Potassium: 4.4 mmol/L (ref 3.5–5.1)
Sodium: 133 mmol/L — ABNORMAL LOW (ref 135–145)

## 2020-02-13 LAB — GLUCOSE, CAPILLARY
Glucose-Capillary: 141 mg/dL — ABNORMAL HIGH (ref 70–99)
Glucose-Capillary: 143 mg/dL — ABNORMAL HIGH (ref 70–99)
Glucose-Capillary: 163 mg/dL — ABNORMAL HIGH (ref 70–99)
Glucose-Capillary: 185 mg/dL — ABNORMAL HIGH (ref 70–99)
Glucose-Capillary: 207 mg/dL — ABNORMAL HIGH (ref 70–99)
Glucose-Capillary: 208 mg/dL — ABNORMAL HIGH (ref 70–99)

## 2020-02-13 MED ORDER — INSULIN DETEMIR 100 UNIT/ML ~~LOC~~ SOLN
30.0000 [IU] | Freq: Two times a day (BID) | SUBCUTANEOUS | Status: DC
  Administered 2020-02-13 – 2020-02-18 (×11): 30 [IU] via SUBCUTANEOUS
  Filled 2020-02-13 (×13): qty 0.3

## 2020-02-13 MED ORDER — INSULIN ASPART 100 UNIT/ML ~~LOC~~ SOLN
0.0000 [IU] | Freq: Three times a day (TID) | SUBCUTANEOUS | Status: DC
  Administered 2020-02-13: 17:00:00 2 [IU] via SUBCUTANEOUS
  Administered 2020-02-13: 5 [IU] via SUBCUTANEOUS
  Administered 2020-02-14: 07:00:00 2 [IU] via SUBCUTANEOUS
  Administered 2020-02-14: 3 [IU] via SUBCUTANEOUS
  Administered 2020-02-14: 17:00:00 2 [IU] via SUBCUTANEOUS
  Administered 2020-02-15: 12:00:00 3 [IU] via SUBCUTANEOUS
  Administered 2020-02-16 – 2020-02-17 (×3): 2 [IU] via SUBCUTANEOUS

## 2020-02-13 MED ORDER — GLIPIZIDE 5 MG PO TABS
5.0000 mg | ORAL_TABLET | Freq: Two times a day (BID) | ORAL | Status: DC
  Administered 2020-02-13 – 2020-02-18 (×10): 5 mg via ORAL
  Filled 2020-02-13 (×12): qty 1

## 2020-02-13 MED ORDER — AMIODARONE HCL 200 MG PO TABS
400.0000 mg | ORAL_TABLET | Freq: Two times a day (BID) | ORAL | Status: DC
  Administered 2020-02-13 – 2020-02-18 (×10): 400 mg via ORAL
  Filled 2020-02-13 (×10): qty 2

## 2020-02-13 MED ORDER — FUROSEMIDE 10 MG/ML IJ SOLN
40.0000 mg | Freq: Once | INTRAVENOUS | Status: AC
  Administered 2020-02-13: 40 mg via INTRAVENOUS
  Filled 2020-02-13: qty 4

## 2020-02-13 MED ORDER — METFORMIN HCL ER 500 MG PO TB24
1000.0000 mg | ORAL_TABLET | Freq: Every day | ORAL | Status: DC
  Administered 2020-02-13 – 2020-02-18 (×5): 1000 mg via ORAL
  Filled 2020-02-13 (×6): qty 2

## 2020-02-13 NOTE — Progress Notes (Signed)
      [address]       [place] [address]             [phone]      Up in chair  BP 112/72   Pulse 66   Temp (!) 97.3 F (36.3 C) (Oral)   Resp 19   Ht 5\' 9"  (1.753 m)   Wt 134.9 kg   SpO2 97%   BMI 43.92 kg/m   Intake/Output Summary (Last 24 hours) at 02/13/2020 1844 Last data filed at 02/13/2020 1600 Gross per 24 hour  Intake 1381.43 ml  Output 2105 ml  Net -723.57 ml   Continues to slowly progress  Remo Lipps C. Roxan Hockey, MD [facility] [phone]

## 2020-02-13 NOTE — Progress Notes (Signed)
2 Days Post-Op Procedure(s) (LRB): CORONARY ARTERY BYPASS GRAFTING (CABG) using LIMA to LAD; RIMA to PL; Endoscopic right greater saphenous vein harvest: SVC to Diag; SVC to OM1. (N/A) TRANSESOPHAGEAL ECHOCARDIOGRAM (TEE) (N/A) INDOCYANINE GREEN FLUORESCENCE IMAGING (ICG) (N/A) Clipping Of Atrial Appendage using AtriCure 35 MM AtriClip. (N/A) Limited Maze Procedure using AtriCure Isolator Synergy Clamp (N/A) Endovein Harvest Of Greater Saphenous Vein using right lower extremity. (Right) Subjective: Some incisional pain  Objective: Vital signs in last 24 hours: Temp:  [98.2 F (36.8 C)-98.7 F (37.1 C)] 98.4 F (36.9 C) (02/21 0300) Pulse Rate:  [88-90] 89 (02/21 0600) Cardiac Rhythm: Atrial paced (02/21 0414) Resp:  [12-36] 34 (02/21 0600) BP: (90-120)/(61-82) 120/76 (02/21 0600) SpO2:  [91 %-99 %] 93 % (02/21 0600) Weight:  [134.9 kg] 134.9 kg (02/21 0630)  Hemodynamic parameters for last 24 hours:    Intake/Output from previous day: 02/20 0701 - 02/21 0700 In: 2711.8 [P.O.:1550; I.V.:961.8; IV Piggyback:200] Out: 1230 [Urine:820; Chest Tube:410] Intake/Output this shift: No intake/output data recorded.  General appearance: alert, cooperative and no distress Neurologic: intact Heart: regular rate and rhythm Lungs: diminished breath sounds bibasilar Abdomen: obese, + BS  Lab Results: Recent Labs    02/12/20 1611 02/13/20 0425  WBC 9.5 7.6  HGB 11.1* 10.4*  HCT 33.7* 31.3*  PLT 251 200   BMET:  Recent Labs    02/12/20 1611 02/13/20 0425  NA 133* 133*  K 4.5 4.4  CL 98 99  CO2 24 26  GLUCOSE 227* 193*  BUN 10 8  CREATININE 0.87 0.73  CALCIUM 8.0* 8.1*    PT/INR:  Recent Labs    02/11/20 1455  LABPROT 15.4*  INR 1.2   ABG    Component Value Date/Time   PHART 7.304 (L) 02/11/2020 2225   HCO3 20.7 02/11/2020 2225   TCO2 22 02/11/2020 2225   ACIDBASEDEF 5.0 (H) 02/11/2020 2225   O2SAT 93.0 02/11/2020 2225   CBG (last 3)  Recent Labs   02/12/20 2315 02/13/20 0319 02/13/20 0807  GLUCAP 195* 185* 207*    Assessment/Plan: S/P Procedure(s) (LRB): CORONARY ARTERY BYPASS GRAFTING (CABG) using LIMA to LAD; RIMA to PL; Endoscopic right greater saphenous vein harvest: SVC to Diag; SVC to OM1. (N/A) TRANSESOPHAGEAL ECHOCARDIOGRAM (TEE) (N/A) INDOCYANINE GREEN FLUORESCENCE IMAGING (ICG) (N/A) Clipping Of Atrial Appendage using AtriCure 35 MM AtriClip. (N/A) Limited Maze Procedure using AtriCure Isolator Synergy Clamp (N/A) Endovein Harvest Of Greater Saphenous Vein using right lower extremity. (Right) -CV- in SR, will convert amiodarone to PO RESP_ continue IS RENAL- creatinine and lytes Ok  Volume overloaded- diurese ENDO- CBG elevated, increase levemir, restart PO meds Gi - advance diet as tolerated Dc chest tubes Continue cardiac rehab   LOS: 5 days    [name] 02/13/2020

## 2020-02-14 ENCOUNTER — Inpatient Hospital Stay (HOSPITAL_COMMUNITY): Payer: BC Managed Care – PPO

## 2020-02-14 ENCOUNTER — Encounter: Payer: Self-pay | Admitting: *Deleted

## 2020-02-14 DIAGNOSIS — J9811 Atelectasis: Secondary | ICD-10-CM | POA: Diagnosis not present

## 2020-02-14 LAB — CBC
HCT: 29.3 % — ABNORMAL LOW (ref 39.0–52.0)
Hemoglobin: 9.8 g/dL — ABNORMAL LOW (ref 13.0–17.0)
MCH: 28.3 pg (ref 26.0–34.0)
MCHC: 33.4 g/dL (ref 30.0–36.0)
MCV: 84.7 fL (ref 80.0–100.0)
Platelets: 196 10*3/uL (ref 150–400)
RBC: 3.46 MIL/uL — ABNORMAL LOW (ref 4.22–5.81)
RDW: 15 % (ref 11.5–15.5)
WBC: 7 10*3/uL (ref 4.0–10.5)
nRBC: 0.3 % — ABNORMAL HIGH (ref 0.0–0.2)

## 2020-02-14 LAB — BASIC METABOLIC PANEL
Anion gap: 10 (ref 5–15)
BUN: 10 mg/dL (ref 8–23)
CO2: 26 mmol/L (ref 22–32)
Calcium: 8.1 mg/dL — ABNORMAL LOW (ref 8.9–10.3)
Chloride: 96 mmol/L — ABNORMAL LOW (ref 98–111)
Creatinine, Ser: 0.63 mg/dL (ref 0.61–1.24)
GFR calc Af Amer: >60 mL/min (ref >60)
GFR calc non Af Amer: >60 mL/min (ref >60)
Glucose, Bld: 158 mg/dL — ABNORMAL HIGH (ref 70–99)
Potassium: 3.9 mmol/L (ref 3.5–5.1)
Sodium: 132 mmol/L — ABNORMAL LOW (ref 135–145)

## 2020-02-14 LAB — GLUCOSE, CAPILLARY
Glucose-Capillary: 144 mg/dL — ABNORMAL HIGH (ref 70–99)
Glucose-Capillary: 175 mg/dL — ABNORMAL HIGH (ref 70–99)
Glucose-Capillary: 138 mg/dL — ABNORMAL HIGH (ref 70–99)
Glucose-Capillary: 162 mg/dL — ABNORMAL HIGH (ref 70–99)

## 2020-02-14 MED ORDER — FUROSEMIDE 10 MG/ML IJ SOLN
60.0000 mg | Freq: Every day | INTRAMUSCULAR | Status: DC
  Administered 2020-02-14 – 2020-02-15 (×2): 60 mg via INTRAVENOUS
  Filled 2020-02-14 (×2): qty 6

## 2020-02-14 MED ORDER — ASPIRIN 81 MG PO CHEW
81.0000 mg | CHEWABLE_TABLET | Freq: Every day | ORAL | Status: DC
  Administered 2020-02-14 – 2020-02-18 (×5): 81 mg via ORAL
  Filled 2020-02-14 (×5): qty 1

## 2020-02-14 MED ORDER — PROMETHAZINE HCL 25 MG/ML IJ SOLN
12.5000 mg | Freq: Once | INTRAMUSCULAR | Status: AC
  Administered 2020-02-14: 14:00:00 12.5 mg via INTRAVENOUS
  Filled 2020-02-14: qty 1

## 2020-02-14 MED FILL — Heparin Sodium (Porcine) Inj 1000 Unit/ML: INTRAMUSCULAR | Qty: 30 | Status: AC

## 2020-02-14 MED FILL — Mannitol IV Soln 20%: INTRAVENOUS | Qty: 500 | Status: AC

## 2020-02-14 MED FILL — Albumin, Human Inj 5%: INTRAVENOUS | Qty: 250 | Status: AC

## 2020-02-14 MED FILL — Potassium Chloride Inj 2 mEq/ML: INTRAVENOUS | Qty: 40 | Status: AC

## 2020-02-14 MED FILL — Magnesium Sulfate Inj 50%: INTRAMUSCULAR | Qty: 10 | Status: AC

## 2020-02-14 MED FILL — Electrolyte-R (PH 7.4) Solution: INTRAVENOUS | Qty: 4000 | Status: AC

## 2020-02-14 MED FILL — Sodium Chloride IV Soln 0.9%: INTRAVENOUS | Qty: 2000 | Status: AC

## 2020-02-14 MED FILL — Sodium Bicarbonate IV Soln 8.4%: INTRAVENOUS | Qty: 50 | Status: AC

## 2020-02-14 NOTE — Progress Notes (Signed)
CARDIAC REHAB PHASE I   Offered to walk with pt. Pt c/o nausea, recently received meds for. Emesis bag given, will try to follow-up later and encourage ambulation.  Rufina Falco, RN BSN 02/14/2020 12:38 PM

## 2020-02-14 NOTE — Anesthesia Postprocedure Evaluation (Signed)
Anesthesia Post Note  Patient: [name]  Procedure(s) Performed: CORONARY ARTERY BYPASS GRAFTING (CABG) using LIMA to LAD; RIMA to PL; Endoscopic right greater saphenous vein harvest: SVC to Diag; SVC to OM1. (N/A Chest) TRANSESOPHAGEAL ECHOCARDIOGRAM (TEE) (N/A ) INDOCYANINE GREEN FLUORESCENCE IMAGING (ICG) (N/A ) Clipping Of Atrial Appendage using AtriCure 35 MM AtriClip. (N/A Chest) Limited Maze Procedure using AtriCure Isolator Synergy Clamp (N/A Chest) Endovein Harvest Of Greater Saphenous Vein using right lower extremity. (Right Leg Upper)     Patient location during evaluation: PACU Anesthesia Type: General Level of consciousness: awake and alert Pain management: pain level controlled Vital Signs Assessment: post-procedure vital signs reviewed and stable Respiratory status: spontaneous breathing, nonlabored ventilation, respiratory function stable and patient connected to nasal cannula oxygen Cardiovascular status: blood pressure returned to baseline and stable Postop Assessment: no apparent nausea or vomiting Anesthetic complications: no    Last Vitals:  Vitals:   02/14/20 0750 02/14/20 0800  BP: 122/82 128/80  Pulse: 80 79  Resp: (!) 21 (!) 21  Temp: 36.6 C   SpO2: 95% 92%    Last Pain:  Vitals:   02/14/20 0800  TempSrc:   PainSc: 0-No pain                 Tiajuana Amass

## 2020-02-14 NOTE — Progress Notes (Signed)
Right IJ Introducer d/c'd per MD order without difficulty.  Pressure dressing applied to site and held x5 minutes.  Clyda Greener and Marita [place] J,RN aware and monitoring.

## 2020-02-14 NOTE — Progress Notes (Signed)
3 Days Post-Op Procedure(s) (LRB): CORONARY ARTERY BYPASS GRAFTING (CABG) using LIMA to LAD; RIMA to PL; Endoscopic right greater saphenous vein harvest: SVC to Diag; SVC to OM1. (N/A) TRANSESOPHAGEAL ECHOCARDIOGRAM (TEE) (N/A) INDOCYANINE GREEN FLUORESCENCE IMAGING (ICG) (N/A) Clipping Of Atrial Appendage using AtriCure 35 MM AtriClip. (N/A) Limited Maze Procedure using AtriCure Isolator Synergy Clamp (N/A) Endovein Harvest Of Greater Saphenous Vein using right lower extremity. (Right) Subjective: No complaints  Objective: Vital signs in last 24 hours: Temp:  [97.3 F (36.3 C)-98.2 F (36.8 C)] 98 F (36.7 C) (02/22 0500) Pulse Rate:  [64-89] 77 (02/22 0605) Cardiac Rhythm: Normal sinus rhythm (02/22 0400) Resp:  [10-39] 24 (02/22 0605) BP: (90-144)/(65-90) 130/75 (02/22 0605) SpO2:  [88 %-100 %] 94 % (02/22 0605) Weight:  [134.1 kg] 134.1 kg (02/22 0602)  Hemodynamic parameters for last 24 hours:    Intake/Output from previous day: 02/21 0701 - 02/22 0700 In: 556.6 [P.O.:260; I.V.:267.6; IV Piggyback:29] Out: [id number] [Urine:1550; Chest Tube:30] Intake/Output this shift: No intake/output data recorded.  General appearance: alert and cooperative Neurologic: intact Heart: regular rate and rhythm, S1, S2 normal, no murmur, click, rub or gallop Lungs: clear to auscultation bilaterally Abdomen: soft, non-tender; bowel sounds normal; no masses,  no organomegaly Extremities: extremities normal, atraumatic, no cyanosis or edema Wound: c/d/i  Lab Results: Recent Labs    02/13/20 0425 02/14/20 0238  WBC 7.6 7.0  HGB 10.4* 9.8*  HCT 31.3* 29.3*  PLT 200 196   BMET:  Recent Labs    02/13/20 0425 02/14/20 0238  NA 133* 132*  K 4.4 3.9  CL 99 96*  CO2 26 26  GLUCOSE 193* 158*  BUN 8 10  CREATININE 0.73 0.63  CALCIUM 8.1* 8.1*    PT/INR:  Recent Labs    02/11/20 1455  LABPROT 15.4*  INR 1.2   ABG    Component Value Date/Time   PHART 7.304 (L) 02/11/2020 2225    HCO3 20.7 02/11/2020 2225   TCO2 22 02/11/2020 2225   ACIDBASEDEF 5.0 (H) 02/11/2020 2225   O2SAT 93.0 02/11/2020 2225   CBG (last 3)  Recent Labs    02/13/20 2017 02/13/20 2347 02/14/20 0520  GLUCAP 163* 141* 144*    Assessment/Plan: S/P Procedure(s) (LRB): CORONARY ARTERY BYPASS GRAFTING (CABG) using LIMA to LAD; RIMA to PL; Endoscopic right greater saphenous vein harvest: SVC to Diag; SVC to OM1. (N/A) TRANSESOPHAGEAL ECHOCARDIOGRAM (TEE) (N/A) INDOCYANINE GREEN FLUORESCENCE IMAGING (ICG) (N/A) Clipping Of Atrial Appendage using AtriCure 35 MM AtriClip. (N/A) Limited Maze Procedure using AtriCure Isolator Synergy Clamp (N/A) Endovein Harvest Of Greater Saphenous Vein using right lower extremity. (Right) Plan for transfer to step-down: see transfer orders   LOS: 6 days    [name] 02/14/2020

## 2020-02-14 NOTE — Progress Notes (Signed)
CARDIAC REHAB PHASE I   Went back to walk with pt. Pt asleep in bed. RN states he was still nauseated and encouraged him to rest. Will follow-up tomorrow.  Rufina Falco, RN BSN 02/14/2020 2:57 PM

## 2020-02-15 ENCOUNTER — Inpatient Hospital Stay (HOSPITAL_COMMUNITY): Payer: BC Managed Care – PPO

## 2020-02-15 DIAGNOSIS — J90 Pleural effusion, not elsewhere classified: Secondary | ICD-10-CM | POA: Diagnosis not present

## 2020-02-15 DIAGNOSIS — J9811 Atelectasis: Secondary | ICD-10-CM | POA: Diagnosis not present

## 2020-02-15 LAB — POCT I-STAT, CHEM 8
BUN: 10 mg/dL (ref 8–23)
BUN: 10 mg/dL (ref 8–23)
BUN: 10 mg/dL (ref 8–23)
BUN: 9 mg/dL (ref 8–23)
BUN: 9 mg/dL (ref 8–23)
BUN: 9 mg/dL (ref 8–23)
BUN: 9 mg/dL (ref 8–23)
BUN: 9 mg/dL (ref 8–23)
Calcium, Ion: 1.24 mmol/L (ref 1.15–1.40)
Calcium, Ion: 1.22 mmol/L (ref 1.15–1.40)
Calcium, Ion: 1.23 mmol/L (ref 1.15–1.40)
Calcium, Ion: 0.98 mmol/L — ABNORMAL LOW (ref 1.15–1.40)
Calcium, Ion: 1.08 mmol/L — ABNORMAL LOW (ref 1.15–1.40)
Calcium, Ion: 1.03 mmol/L — ABNORMAL LOW (ref 1.15–1.40)
Calcium, Ion: 1.07 mmol/L — ABNORMAL LOW (ref 1.15–1.40)
Calcium, Ion: 1.07 mmol/L — ABNORMAL LOW (ref 1.15–1.40)
Chloride: 98 mmol/L (ref 98–111)
Chloride: 100 mmol/L (ref 98–111)
Chloride: 100 mmol/L (ref 98–111)
Chloride: 97 mmol/L — ABNORMAL LOW (ref 98–111)
Chloride: 100 mmol/L (ref 98–111)
Chloride: 100 mmol/L (ref 98–111)
Chloride: 97 mmol/L — ABNORMAL LOW (ref 98–111)
Chloride: 99 mmol/L (ref 98–111)
Creatinine, Ser: 0.6 mg/dL — ABNORMAL LOW (ref 0.61–1.24)
Creatinine, Ser: 0.8 mg/dL (ref 0.61–1.24)
Creatinine, Ser: 0.8 mg/dL (ref 0.61–1.24)
Creatinine, Ser: 0.7 mg/dL (ref 0.61–1.24)
Creatinine, Ser: 0.7 mg/dL (ref 0.61–1.24)
Creatinine, Ser: 0.6 mg/dL — ABNORMAL LOW (ref 0.61–1.24)
Creatinine, Ser: 0.7 mg/dL (ref 0.61–1.24)
Creatinine, Ser: 0.7 mg/dL (ref 0.61–1.24)
Glucose, Bld: 174 mg/dL — ABNORMAL HIGH (ref 70–99)
Glucose, Bld: 146 mg/dL — ABNORMAL HIGH (ref 70–99)
Glucose, Bld: 133 mg/dL — ABNORMAL HIGH (ref 70–99)
Glucose, Bld: 109 mg/dL — ABNORMAL HIGH (ref 70–99)
Glucose, Bld: 148 mg/dL — ABNORMAL HIGH (ref 70–99)
Glucose, Bld: 172 mg/dL — ABNORMAL HIGH (ref 70–99)
Glucose, Bld: 182 mg/dL — ABNORMAL HIGH (ref 70–99)
Glucose, Bld: 198 mg/dL — ABNORMAL HIGH (ref 70–99)
HCT: 36 % — ABNORMAL LOW (ref 39.0–52.0)
HCT: 35 % — ABNORMAL LOW (ref 39.0–52.0)
HCT: 35 % — ABNORMAL LOW (ref 39.0–52.0)
HCT: 31 % — ABNORMAL LOW (ref 39.0–52.0)
HCT: 33 % — ABNORMAL LOW (ref 39.0–52.0)
HCT: 28 % — ABNORMAL LOW (ref 39.0–52.0)
HCT: 30 % — ABNORMAL LOW (ref 39.0–52.0)
HCT: 30 % — ABNORMAL LOW (ref 39.0–52.0)
Hemoglobin: 12.2 g/dL — ABNORMAL LOW (ref 13.0–17.0)
Hemoglobin: 11.9 g/dL — ABNORMAL LOW (ref 13.0–17.0)
Hemoglobin: 11.9 g/dL — ABNORMAL LOW (ref 13.0–17.0)
Hemoglobin: 10.5 g/dL — ABNORMAL LOW (ref 13.0–17.0)
Hemoglobin: 11.2 g/dL — ABNORMAL LOW (ref 13.0–17.0)
Hemoglobin: 10.2 g/dL — ABNORMAL LOW (ref 13.0–17.0)
Hemoglobin: 10.2 g/dL — ABNORMAL LOW (ref 13.0–17.0)
Hemoglobin: 9.5 g/dL — ABNORMAL LOW (ref 13.0–17.0)
Potassium: 4.1 mmol/L (ref 3.5–5.1)
Potassium: 4.3 mmol/L (ref 3.5–5.1)
Potassium: 4.5 mmol/L (ref 3.5–5.1)
Potassium: 4.5 mmol/L (ref 3.5–5.1)
Potassium: 4.4 mmol/L (ref 3.5–5.1)
Potassium: 4.1 mmol/L (ref 3.5–5.1)
Potassium: 4.3 mmol/L (ref 3.5–5.1)
Potassium: 4.3 mmol/L (ref 3.5–5.1)
Sodium: 136 mmol/L (ref 135–145)
Sodium: 136 mmol/L (ref 135–145)
Sodium: 136 mmol/L (ref 135–145)
Sodium: 137 mmol/L (ref 135–145)
Sodium: 136 mmol/L (ref 135–145)
Sodium: 133 mmol/L — ABNORMAL LOW (ref 135–145)
Sodium: 135 mmol/L (ref 135–145)
Sodium: 137 mmol/L (ref 135–145)
TCO2: 28 mmol/L (ref 22–32)
TCO2: 27 mmol/L (ref 22–32)
TCO2: 30 mmol/L (ref 22–32)
TCO2: 27 mmol/L (ref 22–32)
TCO2: 27 mmol/L (ref 22–32)
TCO2: 24 mmol/L (ref 22–32)
TCO2: 24 mmol/L (ref 22–32)
TCO2: 25 mmol/L (ref 22–32)

## 2020-02-15 LAB — POCT I-STAT 7, (LYTES, BLD GAS, ICA,H+H)
Acid-Base Excess: 2 mmol/L (ref 0.0–2.0)
Acid-base deficit: 1 mmol/L (ref 0.0–2.0)
Acid-base deficit: 2 mmol/L (ref 0.0–2.0)
Bicarbonate: 28.4 mmol/L — ABNORMAL HIGH (ref 20.0–28.0)
Bicarbonate: 24.4 mmol/L (ref 20.0–28.0)
Bicarbonate: 26 mmol/L (ref 20.0–28.0)
Calcium, Ion: 0.99 mmol/L — ABNORMAL LOW (ref 1.15–1.40)
Calcium, Ion: 1.07 mmol/L — ABNORMAL LOW (ref 1.15–1.40)
Calcium, Ion: 1.07 mmol/L — ABNORMAL LOW (ref 1.15–1.40)
HCT: 29 % — ABNORMAL LOW (ref 39.0–52.0)
HCT: 29 % — ABNORMAL LOW (ref 39.0–52.0)
HCT: 29 % — ABNORMAL LOW (ref 39.0–52.0)
Hemoglobin: 9.9 g/dL — ABNORMAL LOW (ref 13.0–17.0)
Hemoglobin: 9.9 g/dL — ABNORMAL LOW (ref 13.0–17.0)
Hemoglobin: 9.9 g/dL — ABNORMAL LOW (ref 13.0–17.0)
O2 Saturation: 100 %
O2 Saturation: 89 %
O2 Saturation: 98 %
Potassium: 4.5 mmol/L (ref 3.5–5.1)
Potassium: 4.1 mmol/L (ref 3.5–5.1)
Potassium: 4.3 mmol/L (ref 3.5–5.1)
Sodium: 137 mmol/L (ref 135–145)
Sodium: 136 mmol/L (ref 135–145)
Sodium: 137 mmol/L (ref 135–145)
TCO2: 30 mmol/L (ref 22–32)
TCO2: 26 mmol/L (ref 22–32)
TCO2: 28 mmol/L (ref 22–32)
pCO2 arterial: 51.2 mmHg — ABNORMAL HIGH (ref 32.0–48.0)
pCO2 arterial: 50.7 mmHg — ABNORMAL HIGH (ref 32.0–48.0)
pCO2 arterial: 51.4 mmHg — ABNORMAL HIGH (ref 32.0–48.0)
pH, Arterial: 7.352 (ref 7.350–7.450)
pH, Arterial: 7.291 — ABNORMAL LOW (ref 7.350–7.450)
pH, Arterial: 7.312 — ABNORMAL LOW (ref 7.350–7.450)
pO2, Arterial: 354 mmHg — ABNORMAL HIGH (ref 83.0–108.0)
pO2, Arterial: 120 mmHg — ABNORMAL HIGH (ref 83.0–108.0)
pO2, Arterial: 63 mmHg — ABNORMAL LOW (ref 83.0–108.0)

## 2020-02-15 LAB — BASIC METABOLIC PANEL
Anion gap: 12 (ref 5–15)
BUN: 15 mg/dL (ref 8–23)
CO2: 28 mmol/L (ref 22–32)
Calcium: 8.4 mg/dL — ABNORMAL LOW (ref 8.9–10.3)
Chloride: 92 mmol/L — ABNORMAL LOW (ref 98–111)
Creatinine, Ser: 0.91 mg/dL (ref 0.61–1.24)
GFR calc Af Amer: >60 mL/min (ref >60)
GFR calc non Af Amer: >60 mL/min (ref >60)
Glucose, Bld: 229 mg/dL — ABNORMAL HIGH (ref 70–99)
Potassium: 3.6 mmol/L (ref 3.5–5.1)
Sodium: 132 mmol/L — ABNORMAL LOW (ref 135–145)

## 2020-02-15 LAB — GLUCOSE, CAPILLARY
Glucose-Capillary: 120 mg/dL — ABNORMAL HIGH (ref 70–99)
Glucose-Capillary: 165 mg/dL — ABNORMAL HIGH (ref 70–99)
Glucose-Capillary: 113 mg/dL — ABNORMAL HIGH (ref 70–99)
Glucose-Capillary: 116 mg/dL — ABNORMAL HIGH (ref 70–99)

## 2020-02-15 LAB — MAGNESIUM: Magnesium: 1.7 mg/dL (ref 1.7–2.4)

## 2020-02-15 MED ORDER — ORAL CARE MOUTH RINSE: 15.0000 mL | Freq: Two times a day (BID) | OROMUCOSAL | Status: DC

## 2020-02-15 MED ORDER — AMIODARONE IV BOLUS ONLY 150 MG/100ML
150.0000 mg | Freq: Once | INTRAVENOUS | Status: AC
  Administered 2020-02-15: 150 mg via INTRAVENOUS
  Filled 2020-02-15: qty 100

## 2020-02-15 MED ORDER — AMIODARONE HCL IN DEXTROSE 360-4.14 MG/200ML-% IV SOLN
30.0000 mg/h | INTRAVENOUS | Status: DC
  Administered 2020-02-15 – 2020-02-17 (×4): 30 mg/h via INTRAVENOUS
  Filled 2020-02-15 (×4): qty 200

## 2020-02-15 MED ORDER — POTASSIUM CHLORIDE CRYS ER 20 MEQ PO TBCR
20.0000 meq | EXTENDED_RELEASE_TABLET | Freq: Two times a day (BID) | ORAL | Status: DC
  Administered 2020-02-15 – 2020-02-18 (×6): 20 meq via ORAL
  Filled 2020-02-15 (×6): qty 1

## 2020-02-15 MED ORDER — METOPROLOL TARTRATE 25 MG PO TABS
25.0000 mg | ORAL_TABLET | Freq: Two times a day (BID) | ORAL | Status: DC
  Administered 2020-02-15 – 2020-02-18 (×6): 25 mg via ORAL
  Filled 2020-02-15 (×6): qty 1

## 2020-02-15 MED ORDER — METOPROLOL TARTRATE 25 MG/10 ML ORAL SUSPENSION
25.0000 mg | Freq: Two times a day (BID) | ORAL | Status: DC
  Filled 2020-02-15 (×3): qty 10

## 2020-02-15 MED ORDER — POTASSIUM CHLORIDE 10 MEQ/100ML IV SOLN
10.0000 meq | INTRAVENOUS | Status: AC
  Administered 2020-02-15 (×2): 10 meq via INTRAVENOUS
  Filled 2020-02-15 (×2): qty 100

## 2020-02-15 MED ORDER — TAB-A-VITE/IRON PO TABS
1.0000 | ORAL_TABLET | Freq: Every day | ORAL | Status: DC
  Administered 2020-02-15 – 2020-02-18 (×4): 1 via ORAL
  Filled 2020-02-15 (×4): qty 1

## 2020-02-15 MED ORDER — FUROSEMIDE 10 MG/ML IJ SOLN
60.0000 mg | Freq: Two times a day (BID) | INTRAMUSCULAR | Status: DC
  Administered 2020-02-15 – 2020-02-17 (×5): 60 mg via INTRAVENOUS
  Filled 2020-02-15 (×5): qty 6

## 2020-02-15 MED ORDER — MAGNESIUM SULFATE 2 GM/50ML IV SOLN
2.0000 g | Freq: Once | INTRAVENOUS | Status: AC
  Administered 2020-02-15: 2 g via INTRAVENOUS
  Filled 2020-02-15: qty 50

## 2020-02-15 MED ORDER — MAGNESIUM OXIDE 400 (241.3 MG) MG PO TABS
800.0000 mg | ORAL_TABLET | Freq: Two times a day (BID) | ORAL | Status: DC
  Administered 2020-02-15 – 2020-02-18 (×7): 800 mg via ORAL
  Filled 2020-02-15 (×7): qty 2

## 2020-02-15 NOTE — Progress Notes (Addendum)
CARDIAC REHAB PHASE I   PRE:  Rate/Rhythm: 85 afib  BP:  Supine:   Sitting: 120/50  Standing:    SaO2: 92%RA  MODE:  Ambulation: 56 ft   POST:  Rate/Rhythm: 87 afib  BP:  Supine:   Sitting: 102/37  Standing:    SaO2: 95%RA 1000-1050 Pt walked 56 ft on RA with rollator and asst x 1. He became weak and had to sit at 28 ft. C/o legs weak but not dizziness. Back to room and to recliner. Comfort measures given as he c/o being hot. Has fan and wet cloth. Encouraged IS.  Would recommend rollator for home use.  Graylon Good, RN BSN  02/15/2020 10:43 AM

## 2020-02-15 NOTE — Plan of Care (Signed)
  Problem: Education: Goal: Knowledge of General Education information will improve Description: Including pain rating scale, medication(s)/side effects and non-pharmacologic comfort measures Outcome: Progressing   Problem: Education: Goal: Will demonstrate proper wound care and an understanding of methods to prevent future damage Outcome: Progressing Goal: Knowledge of disease or condition will improve Outcome: Progressing Goal: Knowledge of the prescribed therapeutic regimen will improve Outcome: Progressing Goal: Individualized Educational Video(s) Outcome: Progressing   Problem: Activity: Goal: Risk for activity intolerance will decrease Outcome: Progressing   Problem: Cardiac: Goal: Will achieve and/or maintain hemodynamic stability Outcome: Progressing   Problem: Clinical Measurements: Goal: Postoperative complications will be avoided or minimized Outcome: Progressing   Problem: Respiratory: Goal: Respiratory status will improve Outcome: Progressing   Problem: Skin Integrity: Goal: Wound healing without signs and symptoms of infection Outcome: Progressing Goal: Risk for impaired skin integrity will decrease Outcome: Progressing   Problem: Urinary Elimination: Goal: Ability to achieve and maintain adequate renal perfusion and functioning will improve Outcome: Progressing   

## 2020-02-15 NOTE — Progress Notes (Signed)
4 Days Post-Op Procedure(s) (LRB): CORONARY ARTERY BYPASS GRAFTING (CABG) using LIMA to LAD; RIMA to PL; Endoscopic right greater saphenous vein harvest: SVC to Diag; SVC to OM1. (N/A) TRANSESOPHAGEAL ECHOCARDIOGRAM (TEE) (N/A) INDOCYANINE GREEN FLUORESCENCE IMAGING (ICG) (N/A) Clipping Of Atrial Appendage using AtriCure 35 MM AtriClip. (N/A) Limited Maze Procedure using AtriCure Isolator Synergy Clamp (N/A) Endovein Harvest Of Greater Saphenous Vein using right lower extremity. (Right) Subjective: Transfer to [facility] from [facility] yesterday afternoon. Sitting up eating breakfast, says appetite is improving. No BM yet.   Developed a-fib around 7pm yesterday.   Objective: Vital signs in last 24 hours: Temp:  [97.8 F (36.6 C)-98.4 F (36.9 C)] 98 F (36.7 C) (02/23 0718) Pulse Rate:  [67-90] 88 (02/23 0718) Cardiac Rhythm: Atrial fibrillation;Bundle branch block (02/23 0718) Resp:  [9-26] 14 (02/23 0718) BP: (97-130)/(53-87) 130/87 (02/23 0718) SpO2:  [92 %-97 %] 93 % (02/23 0718) Weight:  [132.2 kg] 132.2 kg (02/23 0500)     Intake/Output from previous day: 02/22 0701 - 02/23 0700 In: 200 [P.O.:200] Out: 1800 [Urine:1800] Intake/Output this shift: Total I/O In: -  Out: 200 [Urine:200]  General appearance: alert, cooperative and no distress Neurologic: intact Heart: irregularly irregular rhythm Lungs: Breath sounds clear. Abdomen: Soft, non-tender Extremities: Warm, well perfused. Mild LE edema.  Wound: The sternal incision is covered with a compressed Prevena dressing.  Lab Results: Recent Labs    02/13/20 0425 02/14/20 0238  WBC 7.6 7.0  HGB 10.4* 9.8*  HCT 31.3* 29.3*  PLT 200 196   BMET:  Recent Labs    02/13/20 0425 02/14/20 0238  NA 133* 132*  K 4.4 3.9  CL 99 96*  CO2 26 26  GLUCOSE 193* 158*  BUN 8 10  CREATININE 0.73 0.63  CALCIUM 8.1* 8.1*    PT/INR: No results for input(s): LABPROT, INR in the last 72 hours. ABG    Component Value Date/Time   PHART 7.304 (L) 02/11/2020 2225   HCO3 20.7 02/11/2020 2225   TCO2 22 02/11/2020 2225   ACIDBASEDEF 5.0 (H) 02/11/2020 2225   O2SAT 93.0 02/11/2020 2225   CBG (last 3)  Recent Labs    02/14/20 1621 02/14/20 2109 02/15/20 0555  GLUCAP 138* 162* 120*    Assessment/Plan: S/P Procedure(s) (LRB): CORONARY ARTERY BYPASS GRAFTING (CABG) using LIMA to LAD; RIMA to PL; Endoscopic right greater saphenous vein harvest: SVC to Diag; SVC to OM1. (N/A) TRANSESOPHAGEAL ECHOCARDIOGRAM (TEE) (N/A) INDOCYANINE GREEN FLUORESCENCE IMAGING (ICG) (N/A) Clipping Of Atrial Appendage using AtriCure 35 MM AtriClip. (N/A) Limited Maze Procedure using AtriCure Isolator Synergy Clamp (N/A) Endovein Harvest Of Greater Saphenous Vein using right lower extremity. (Right)  --POD4 CABG / MAZE. Making good progress. Continue ASA, atorvastatin, Plavix.  Advance activity. Can D/C pacer wires.   -Atrial fibrillation- rate well controlled. Oral amiodarone load started early post-op.  BP is acceptable, Will give additional IV bolus amiodarone this AM.   -Volume excess- Wt only 2kg above pre-op. Responding well to diuresis.   -Type 2 DM- Glucose 120-175 over past 24 hours. Continue glucotrol,  metformin, Levemir, SSI.   -COPD- Oxygenating well, on 1LncO2. Encouraging ambulation and pulmonary hygiene.     LOS: 7 days    [name], [place] [phone] 02/15/2020

## 2020-02-16 ENCOUNTER — Encounter: Payer: Self-pay | Admitting: *Deleted

## 2020-02-16 DIAGNOSIS — I4819 Other persistent atrial fibrillation: Secondary | ICD-10-CM | POA: Diagnosis not present

## 2020-02-16 DIAGNOSIS — I25110 Atherosclerotic heart disease of native coronary artery with unstable angina pectoris: Secondary | ICD-10-CM | POA: Diagnosis not present

## 2020-02-16 DIAGNOSIS — Z951 Presence of aortocoronary bypass graft: Secondary | ICD-10-CM | POA: Diagnosis not present

## 2020-02-16 LAB — GLUCOSE, CAPILLARY
Glucose-Capillary: 121 mg/dL — ABNORMAL HIGH (ref 70–99)
Glucose-Capillary: 133 mg/dL — ABNORMAL HIGH (ref 70–99)
Glucose-Capillary: 116 mg/dL — ABNORMAL HIGH (ref 70–99)
Glucose-Capillary: 108 mg/dL — ABNORMAL HIGH (ref 70–99)

## 2020-02-16 LAB — BASIC METABOLIC PANEL
Anion gap: 11 (ref 5–15)
BUN: 15 mg/dL (ref 8–23)
CO2: 30 mmol/L (ref 22–32)
Calcium: 8.2 mg/dL — ABNORMAL LOW (ref 8.9–10.3)
Chloride: 95 mmol/L — ABNORMAL LOW (ref 98–111)
Creatinine, Ser: 0.91 mg/dL (ref 0.61–1.24)
GFR calc Af Amer: >60 mL/min (ref >60)
GFR calc non Af Amer: >60 mL/min (ref >60)
Glucose, Bld: 118 mg/dL — ABNORMAL HIGH (ref 70–99)
Potassium: 4.1 mmol/L (ref 3.5–5.1)
Sodium: 136 mmol/L (ref 135–145)

## 2020-02-16 LAB — CBC
HCT: 29.3 % — ABNORMAL LOW (ref 39.0–52.0)
Hemoglobin: 10.1 g/dL — ABNORMAL LOW (ref 13.0–17.0)
MCH: 28.2 pg (ref 26.0–34.0)
MCHC: 34.5 g/dL (ref 30.0–36.0)
MCV: 81.8 fL (ref 80.0–100.0)
Platelets: 286 10*3/uL (ref 150–400)
RBC: 3.58 MIL/uL — ABNORMAL LOW (ref 4.22–5.81)
RDW: 15.1 % (ref 11.5–15.5)
WBC: 7.4 10*3/uL (ref 4.0–10.5)
nRBC: 0.3 % — ABNORMAL HIGH (ref 0.0–0.2)

## 2020-02-16 NOTE — Progress Notes (Signed)
5 Days Post-Op Procedure(s) (LRB): CORONARY ARTERY BYPASS GRAFTING (CABG) using LIMA to LAD; RIMA to PL; Endoscopic right greater saphenous vein harvest: SVC to Diag; SVC to OM1. (N/A) TRANSESOPHAGEAL ECHOCARDIOGRAM (TEE) (N/A) INDOCYANINE GREEN FLUORESCENCE IMAGING (ICG) (N/A) Clipping Of Atrial Appendage using AtriCure 35 MM AtriClip. (N/A) Limited Maze Procedure using AtriCure Isolator Synergy Clamp (N/A) Endovein Harvest Of Greater Saphenous Vein using right lower extremity. (Right) Subjective: Up in the bedside chair, no new concerns.  Walked a short distance with PT yesterday. Appetite OK, small BM yesterday.   He has remained in a-fib with CVR despite conversion back to IV amiodarone last evening.   Objective: Vital signs in last 24 hours: Temp:  [97.6 F (36.4 C)-98.9 F (37.2 C)] 98.1 F (36.7 C) (02/24 0720) Pulse Rate:  [77-98] 98 (02/24 0720) Cardiac Rhythm: Atrial fibrillation (02/24 0720) Resp:  [17-22] 20 (02/24 0720) BP: (96-121)/(68-92) 118/91 (02/24 0720) SpO2:  [91 %-95 %] 94 % (02/24 0720) Weight:  [131.3 kg] 131.3 kg (02/24 0409)    Intake/Output from previous day: 02/23 0701 - 02/24 0700 In: 1023 [P.O.:480; I.V.:131.9; IV Piggyback:411.2] Out: 1675 [Urine:1675] Intake/Output this shift: No intake/output data recorded.  Physical Exam: General appearance: alert, cooperative and no distress Neurologic: intact Heart: irregularly irregular rhythm Lungs: Breath sounds clear. Abdomen: Soft, non-tender Extremities: Warm, well perfused. Mild LE edema.  Wound: The sternal incision is covered with a compressed Prevena dressing.  Lab Results: Recent Labs    02/14/20 0238 02/16/20 0224  WBC 7.0 7.4  HGB 9.8* 10.1*  HCT 29.3* 29.3*  PLT 196 286   BMET:  Recent Labs    02/15/20 0832 02/16/20 0224  NA 132* 136  K 3.6 4.1  CL 92* 95*  CO2 28 30  GLUCOSE 229* 118*  BUN 15 15  CREATININE 0.91 0.91  CALCIUM 8.4* 8.2*    PT/INR: No results for  input(s): LABPROT, INR in the last 72 hours. ABG    Component Value Date/Time   PHART 7.304 (L) 02/11/2020 2225   HCO3 20.7 02/11/2020 2225   TCO2 22 02/11/2020 2225   ACIDBASEDEF 5.0 (H) 02/11/2020 2225   O2SAT 93.0 02/11/2020 2225   CBG (last 3)  Recent Labs    02/15/20 1653 02/15/20 2058 02/16/20 0615  GLUCAP 113* 116* 121*    Assessment/Plan: S/P Procedure(s) (LRB): CORONARY ARTERY BYPASS GRAFTING (CABG) using LIMA to LAD; RIMA to PL; Endoscopic right greater saphenous vein harvest: SVC to Diag; SVC to OM1. (N/A) TRANSESOPHAGEAL ECHOCARDIOGRAM (TEE) (N/A) INDOCYANINE GREEN FLUORESCENCE IMAGING (ICG) (N/A) Clipping Of Atrial Appendage using AtriCure 35 MM AtriClip. (N/A) Limited Maze Procedure using AtriCure Isolator Synergy Clamp (N/A) Endovein Harvest Of Greater Saphenous Vein using right lower extremity. (Right)  --POD5 CABG / MAZE/ Left atrial clip for MVCAD and angina pectoris. Pre-op EF ~35%. Making slow progress with PT. Continue ASA, atorvastatin, Plavix.  Advance activity.   -Atrial fibrillation- rate remains well controlled. On amiodarone since surgery, currently on IV infusion.   BP is acceptable.  Will ask EP to evaluate and consider DC cardioversion. He is crurently NPO.  K+ 4.1, he was given MgSO4 2 gm IV yesterday for Mg++ 1.7.   -Volume excess- Responding well to diuresis. Wt is +1kg over pre-op Wt.  -Type 2 DM- Glucose 113-165 over past 24 hours. Continue glucotrol,  metformin, Levemir, SSI.   -COPD- Oxygenating well on RA. Encouraging ambulation and pulmonary hygiene.    LOS: 8 days    Antony Odea, [place] [phone] 02/16/2020

## 2020-02-16 NOTE — Progress Notes (Signed)
DAILY PROGRESS NOTE   Patient Name: [name] Date of Encounter: 02/16/2020 Cardiologist: Minus Breeding, MD  Chief Complaint   No complaints  Patient Profile   64 yo male with CAD s/p CABG x 2 with LIMA-LAD and RIMA-PL. Had prophylactic pulmonary vein isolation with LAA clipping, then developed post-op afib.  Subjective   Sitting reclined in a chair. Noted to be in afib with CVR - this occurred ~48 hrs ago. Has been on amiodarone post-op after limited MAZE and LAA atricure clipping. Not on anticoagulation.  Objective   Vitals:   02/15/20 2012 02/15/20 2247 02/16/20 0409 02/16/20 0720  BP: 99/71 104/73 (!) 121/92 (!) 118/91  Pulse: 77 79 87 98  Resp: 18 19 20 20   Temp: 98.9 F (37.2 C) 98.2 F (36.8 C) 98.3 F (36.8 C) 98.1 F (36.7 C)  TempSrc: Oral Oral Oral Oral  SpO2: 91% 91% 95% 94%  Weight:   131.3 kg   Height:        Intake/Output Summary (Last 24 hours) at 02/16/2020 0955 Last data filed at 02/16/2020 0720 Gross per 24 hour  Intake 1023.02 ml  Output 1275 ml  Net -251.98 ml   Filed Weights   02/14/20 0602 02/15/20 0500 02/16/20 0409  Weight: 134.1 kg 132.2 kg 131.3 kg    Physical Exam   General appearance: alert and no distress Lungs: diminished breath sounds bibasilar Heart: irregularly irregular rhythm Extremities: edema trace to 1+ Neurologic: Grossly normal  Inpatient Medications    Scheduled Meds: . acetaminophen  1,000 mg Oral Q6H   Or  . acetaminophen (TYLENOL) oral liquid 160 mg/5 mL  1,000 mg Per Tube Q6H  . amiodarone  400 mg Oral BID  . aspirin  81 mg Oral Daily  . atorvastatin  80 mg Oral q1800  . bisacodyl  10 mg Oral Daily   Or  . bisacodyl  10 mg Rectal Daily  . chlorhexidine  15 mL Mouth Rinse BID  . Chlorhexidine Gluconate Cloth  6 each Topical Daily  . clopidogrel  75 mg Oral Daily  . colchicine  0.6 mg Oral Daily  . docusate sodium  200 mg Oral Daily  . furosemide  60 mg Intravenous BID  . glipiZIDE  5 mg Oral  BID AC  . insulin aspart  0-15 Units Subcutaneous TID WC  . insulin detemir  30 Units Subcutaneous Q12H  . magnesium oxide  800 mg Oral BID  . mouth rinse  15 mL Mouth Rinse q12n4p  . metFORMIN  1,000 mg Oral Q breakfast  . metoprolol tartrate  25 mg Oral BID   Or  . metoprolol tartrate  25 mg Per Tube BID  . multivitamins with iron  1 tablet Oral Daily  . pantoprazole  40 mg Oral Daily  . potassium chloride  20 mEq Oral BID  . sodium chloride flush  3 mL Intravenous Q12H    Continuous Infusions: . amiodarone 30 mg/hr (02/16/20 0500)    PRN Meds: dextrose, metoprolol tartrate, ondansetron (ZOFRAN) IV, oxyCODONE, traMADol   Labs   Results for orders placed or performed during the hospital encounter of 02/08/20 (from the past 48 hour(s))  Glucose, capillary     Status: Abnormal   Collection Time: 02/14/20 11:32 AM  Result Value Ref Range   Glucose-Capillary 175 (H) 70 - 99 mg/dL  Glucose, capillary     Status: Abnormal   Collection Time: 02/14/20  4:21 PM  Result Value Ref Range   Glucose-Capillary 138 (  H) 70 - 99 mg/dL  Glucose, capillary     Status: Abnormal   Collection Time: 02/14/20  9:09 PM  Result Value Ref Range   Glucose-Capillary 162 (H) 70 - 99 mg/dL   Comment 1 Notify RN    Comment 2 Document in Chart   Glucose, capillary     Status: Abnormal   Collection Time: 02/15/20  5:55 AM  Result Value Ref Range   Glucose-Capillary 120 (H) 70 - 99 mg/dL   Comment 1 Notify RN    Comment 2 Document in Chart   Basic metabolic panel     Status: Abnormal   Collection Time: 02/15/20  8:32 AM  Result Value Ref Range   Sodium 132 (L) 135 - 145 mmol/L   Potassium 3.6 3.5 - 5.1 mmol/L   Chloride 92 (L) 98 - 111 mmol/L   CO2 28 22 - 32 mmol/L   Glucose, Bld 229 (H) 70 - 99 mg/dL    Comment: Glucose reference range applies only to samples taken after fasting for at least 8 hours.   BUN 15 8 - 23 mg/dL   Creatinine, Ser 0.91 0.61 - 1.24 mg/dL   Calcium 8.4 (L) 8.9 - 10.3  mg/dL   GFR calc non Af Amer >60 >60 mL/min   GFR calc Af Amer >60 >60 mL/min   Anion gap 12 5 - 15    Comment: Performed at [facility] [place] [address], [place], [place] [address]  Magnesium     Status: None   Collection Time: 02/15/20  8:32 AM  Result Value Ref Range   Magnesium 1.7 1.7 - 2.4 mg/dL    Comment: Performed at [facility] [address] [place], [place] [address]  Glucose, capillary     Status: Abnormal   Collection Time: 02/15/20 11:29 AM  Result Value Ref Range   Glucose-Capillary 165 (H) 70 - 99 mg/dL    Comment: Glucose reference range applies only to samples taken after fasting for at least 8 hours.  Glucose, capillary     Status: Abnormal   Collection Time: 02/15/20  4:53 PM  Result Value Ref Range   Glucose-Capillary 113 (H) 70 - 99 mg/dL    Comment: Glucose reference range applies only to samples taken after fasting for at least 8 hours.  Glucose, capillary     Status: Abnormal   Collection Time: 02/15/20  8:58 PM  Result Value Ref Range   Glucose-Capillary 116 (H) 70 - 99 mg/dL    Comment: Glucose reference range applies only to samples taken after fasting for at least 8 hours.   Comment 1 Notify RN    Comment 2 Document in Chart   CBC     Status: Abnormal   Collection Time: 02/16/20  2:24 AM  Result Value Ref Range   WBC 7.4 4.0 - 10.5 K/uL   RBC 3.58 (L) 4.22 - 5.81 MIL/uL   Hemoglobin 10.1 (L) 13.0 - 17.0 g/dL   HCT 29.3 (L) 39.0 - 52.0 %   MCV 81.8 80.0 - 100.0 fL   MCH 28.2 26.0 - 34.0 pg   MCHC 34.5 30.0 - 36.0 g/dL   RDW 15.1 11.5 - 15.5 %   Platelets 286 150 - 400 K/uL   nRBC 0.3 (H) 0.0 - 0.2 %    Comment: Performed at [facility], [place] [address], [place], [place] [id number]  Basic metabolic panel     Status: Abnormal   Collection Time:  02/16/20  2:24 AM  Result Value Ref Range   Sodium 136 135 - 145 mmol/L   Potassium 4.1 3.5 - 5.1 mmol/L   Chloride 95 (L) 98 - 111 mmol/L   CO2 30 22 - 32 mmol/L   Glucose, Bld 118  (H) 70 - 99 mg/dL    Comment: Glucose reference range applies only to samples taken after fasting for at least 8 hours.   BUN 15 8 - 23 mg/dL   Creatinine, Ser 0.91 0.61 - 1.24 mg/dL   Calcium 8.2 (L) 8.9 - 10.3 mg/dL   GFR calc non Af Amer >60 >60 mL/min   GFR calc Af Amer >60 >60 mL/min   Anion gap 11 5 - 15    Comment: Performed at [facility] [address], [place], [place] [address]  Glucose, capillary     Status: Abnormal   Collection Time: 02/16/20  6:15 AM  Result Value Ref Range   Glucose-Capillary 121 (H) 70 - 99 mg/dL    Comment: Glucose reference range applies only to samples taken after fasting for at least 8 hours.   Comment 1 Notify RN    Comment 2 Document in Chart     ECG   N/A  Telemetry   Afib with CVR in the low 100's - Personally Reviewed  Radiology    DG Chest 2 View  Result Date: 02/15/2020 CLINICAL DATA:  Postoperative from cardiac surgery EXAM: CHEST - 2 VIEW COMPARISON:  02/14/2020 FINDINGS: Enlargement of cardiac silhouette post median sternotomy, CABG, and LEFT atrial appendage clipping. Slightly rotated to the RIGHT. Stable mediastinal contours and pulmonary vascularity. Scattered subsegmental atelectasis in both lungs. No acute infiltrate or pneumothorax. Tiny bibasilar effusions. Osseous structures unremarkable. IMPRESSION: Scattered subsegmental atelectasis with tiny bibasilar effusions. Enlargement of cardiac silhouette post cardiac surgery. Electronically Signed   By: Lavonia Dana M.D.   On: 02/15/2020 09:33    Cardiac Studies   Procedure: 2D Echo   Indications:  CAD Native Vessel 414.01/I25.10    History:    Patient has prior history of Echocardiogram examinations,  most         recent 08/28/2018. CHF, CAD, COPD; Risk  Factors:Hypertension,         Diabetes, Dyslipidemia, Sleep Apnea and Former Smoker.    Sonographer:  Clayton Lefort RDCS (AE)  Referring Phys: [place]  Comments: Patient is morbidly obese and suboptimal subcostal  window.  IMPRESSIONS    1. Left ventricular ejection fraction, by estimation, is 45 to 50%. The  left ventricle has mildly decreased function. The left ventricle  demonstrates global hypokinesis. The left ventricular internal cavity size  was moderately dilated. There is moderate  left ventricular hypertrophy. Left ventricular diastolic parameters are  consistent with Grade I diastolic dysfunction (impaired relaxation).  2. Right ventricular systolic function is normal. The right ventricular  size is normal.  3. The mitral valve is normal in structure and function. No evidence of  mitral valve regurgitation. No evidence of mitral stenosis.  4. The aortic valve is normal in structure and function. Aortic valve  regurgitation is not visualized. No aortic stenosis is present.  5. The inferior vena cava is normal in size with greater than 50%  respiratory variability, suggesting right atrial pressure of 3 mmHg.   Comparison(s): Prior images unable to be directly viewed, comparison made  by report only. No significant change from prior study.   Assessment   Principal Problem:  Coronary artery disease involving native coronary artery of native heart with unstable angina pectoris (HCC) Active Problems:   OSA (obstructive sleep apnea)   S/P CABG x 4   Persistent atrial fibrillation ([place])   Plan   Case d/w Dr. Rayann Heman (cardiac EP, afib director) - Mr. Athas had no known afib prior to surgery, but felt to be at increased risk of afib post-op, therefore, underwent what appears to be a prophylactic pulmonary vein isolation and LAA closure. Loaded on amiodarone and then developed afib ~48 hrs ago, which persists and is rate-controlled. He is asymptomatic. LVEF has improved to 45-50%. According to Dr. Rayann Heman and our current standard of care, therapeutic anticoagulation is recommended for 1 month around cardioversion to reduce  stroke risk - atrial appendage closure is not 100% effective at stroke risk reduction and there are studies showing incomplete closure of the appendage or accessory lobes that may confer risk. He also felt that there is a risk that his afib may occur, since he has had surgical "injury" to the atrial tissue and is at increased risk of ERAF after MAZE, possibly subjecting him to recurrence. We would advise 3-4 weeks of anticoagulation and follow-up in the afib clinic. If he has not spontaneously converted at that time, we will arrange for DCCV. Would convert IV amiodarone to po - at this point, probably 200 mg daily as he has been sufficiently loaded. Would prefer Eliquis, however, defer to CT surgery.  Feel free to contact me or Dr. Rayann Heman with questions. Case d/w M. Roddenberry, PA-C.  Time Spent Directly with Patient:  I have spent a total of 35 minutes with the patient reviewing hospital notes, telemetry, EKGs, labs and examining the patient as well as establishing an assessment and plan that was discussed personally with the patient.  > 50% of time was spent in direct patient care.  Length of Stay:  LOS: 8 days   Pixie Casino, MD, [facility], [place] Director of the Advanced Lipid Disorders &  Cardiovascular Risk Reduction Clinic Diplomate of the American Board of Clinical Lipidology Attending Cardiologist  Direct Dial: [phone]  Fax: [phone]  Website:  [email or web address] 02/16/2020, 9:55 AM

## 2020-02-16 NOTE — Plan of Care (Signed)
  Problem: Education: Goal: Will demonstrate proper wound care and an understanding of methods to prevent future damage Outcome: Progressing Goal: Knowledge of disease or condition will improve Outcome: Progressing Goal: Knowledge of the prescribed therapeutic regimen will improve Outcome: Progressing Goal: Individualized Educational Video(s) Outcome: Progressing   Problem: Activity: Goal: Risk for activity intolerance will decrease Outcome: Progressing   Problem: Clinical Measurements: Goal: Postoperative complications will be avoided or minimized Outcome: Progressing   Problem: Respiratory: Goal: Respiratory status will improve Outcome: Progressing   Problem: Skin Integrity: Goal: Wound healing without signs and symptoms of infection Outcome: Progressing Goal: Risk for impaired skin integrity will decrease Outcome: Progressing   Problem: Urinary Elimination: Goal: Ability to achieve and maintain adequate renal perfusion and functioning will improve Outcome: Progressing   

## 2020-02-16 NOTE — Progress Notes (Signed)
CARDIAC REHAB PHASE I   PRE:  Rate/Rhythm: 91 afib  BP:  Supine:   Sitting: 117/81  Standing:    SaO2: 96%RA  MODE:  Ambulation: 140 ft   POST:  Rate/Rhythm: 98 afib  BP:  Supine:   Sitting: 120/78  Standing:    SaO2: 96%RA 1310-1342 Pt walked 140 ft on RA with rollator and asst x 2. Sat at 6 ft and then walked back to room. Would recommend rollator for home use. To bed with wife in room.  Encouraged another walk today and encourage IS. Pt diaphoretic but states this is not new.    Graylon Good, RN BSN  02/16/2020 1:39 PM

## 2020-02-17 ENCOUNTER — Other Ambulatory Visit (HOSPITAL_COMMUNITY): Payer: BC Managed Care – PPO

## 2020-02-17 ENCOUNTER — Inpatient Hospital Stay (HOSPITAL_COMMUNITY): Payer: BC Managed Care – PPO

## 2020-02-17 DIAGNOSIS — R0602 Shortness of breath: Secondary | ICD-10-CM | POA: Diagnosis not present

## 2020-02-17 DIAGNOSIS — I25110 Atherosclerotic heart disease of native coronary artery with unstable angina pectoris: Principal | ICD-10-CM

## 2020-02-17 LAB — GLUCOSE, CAPILLARY
Glucose-Capillary: 132 mg/dL — ABNORMAL HIGH (ref 70–99)
Glucose-Capillary: 128 mg/dL — ABNORMAL HIGH (ref 70–99)
Glucose-Capillary: 93 mg/dL (ref 70–99)
Glucose-Capillary: 69 mg/dL — ABNORMAL LOW (ref 70–99)
Glucose-Capillary: 119 mg/dL — ABNORMAL HIGH (ref 70–99)

## 2020-02-17 LAB — ECHO INTRAOPERATIVE TEE
Height: 69 in
Weight: 4502.4 oz

## 2020-02-17 LAB — BASIC METABOLIC PANEL
Anion gap: 13 (ref 5–15)
BUN: 16 mg/dL (ref 8–23)
CO2: 28 mmol/L (ref 22–32)
Calcium: 8.6 mg/dL — ABNORMAL LOW (ref 8.9–10.3)
Chloride: 92 mmol/L — ABNORMAL LOW (ref 98–111)
Creatinine, Ser: 0.94 mg/dL (ref 0.61–1.24)
GFR calc Af Amer: >60 mL/min (ref >60)
GFR calc non Af Amer: >60 mL/min (ref >60)
Glucose, Bld: 135 mg/dL — ABNORMAL HIGH (ref 70–99)
Potassium: 3.5 mmol/L (ref 3.5–5.1)
Sodium: 133 mmol/L — ABNORMAL LOW (ref 135–145)

## 2020-02-17 NOTE — Progress Notes (Signed)
   Patient noted to convert to sinus overnight. Agree with transition to oral amiodarone. Can follow-up in afib clinic if afib returns. No further suggestions.  CARDIOLOGY RECOMMENDATIONS:  Discharge is anticipated in the next 48 hours. Recommendations for medications and follow up:  Discharge Medications: Continue medications as they are currently listed in the [facility]. Exceptions to the above:  Amiodarone 400 mg BID x 1 week, then 400 mg daily  Follow Up: The patient's Primary Cardiologist is Minus Breeding, MD   Follow up in the office in 2 week(s).  Signed,  Pixie Casino, MD  8:32 AM 02/17/2020  [facility]

## 2020-02-17 NOTE — Progress Notes (Signed)
CARDIAC REHAB PHASE I   PRE:  Rate/Rhythm: 80 SR  BP:  Supine:   Sitting: 110/83  Standing:    SaO2: 96%RA  MODE:  Ambulation: 220 ft   POST:  Rate/Rhythm: 91 SR  BP:  Supine:   Sitting: 136/86  Standing:    SaO2: 92%RA 1255-1350 Pt walked 220 ft on RA with rollator and did not need to sit. Legs tired by end of walk. Pt does not walk long distances due to arthritis. Would recommend rollator for home use. To recliner and pt remained in NSR. Reinforced sternal precautions. Education completed with pt and wife who voiced understanding. Not interested in referral to CRP 2 due to limitation in mobility. Reviewed staying in the tube, modified walking, heart healthy and low carb food choices, and wound care. Encouraged to also watch sodium. Pt stated he is going to try to lose some weight.    Graylon Good, RN BSN  02/17/2020 1:48 PM

## 2020-02-17 NOTE — Progress Notes (Signed)
Pacer wires removed. Pt tolerated well. BP 124/81, sats 94% RA, RR 19. Will continue to monitor pt.

## 2020-02-17 NOTE — Progress Notes (Signed)
CARDIAC REHAB PHASE I   Offered to walk with pt. Pt states he is more SOB today. Sats 94 on RA sitting in recliner. Pt states his IV lasix is starting to work, and requesting to walk after lunch. Encouraged IS use while sitting in recliner. Will f/u later to encourage continued ambulation.   Pt and wife also requesting rollator for home use, RN made aware.  [id number] Rufina Falco, RN BSN 02/17/2020 10:49 AM

## 2020-02-17 NOTE — Progress Notes (Signed)
6 Days Post-Op Procedure(s) (LRB): CORONARY ARTERY BYPASS GRAFTING (CABG) using LIMA to LAD; RIMA to PL; Endoscopic right greater saphenous vein harvest: SVC to Diag; SVC to OM1. (N/A) TRANSESOPHAGEAL ECHOCARDIOGRAM (TEE) (N/A) INDOCYANINE GREEN FLUORESCENCE IMAGING (ICG) (N/A) Clipping Of Atrial Appendage using AtriCure 35 MM AtriClip. (N/A) Limited Maze Procedure using AtriCure Isolator Synergy Clamp (N/A) Endovein Harvest Of Greater Saphenous Vein using right lower extremity. (Right) Subjective: Up in the chair and says he feels like he is progressing. Complains of more shortness of breath last night that was worse while supine. No cough.   He converted to NSR last night.   Objective: Vital signs in last 24 hours: Temp:  [97.7 F (36.5 C)-98.4 F (36.9 C)] 97.8 F (36.6 C) (02/25 0721) Pulse Rate:  [80-90] 80 (02/25 0721) Cardiac Rhythm: Normal sinus rhythm (02/25 0721) Resp:  [12-27] 18 (02/25 0721) BP: (99-140)/(58-83) 140/83 (02/25 0721) SpO2:  [93 %-97 %] 97 % (02/25 0721) Weight:  [129.7 kg] 129.7 kg (02/25 0326)   Intake/Output from previous day: 02/24 0701 - 02/25 0700 In: 1129.6 [P.O.:720; I.V.:409.6] Out: 2675 [Urine:2675] Intake/Output this shift: No intake/output data recorded.  Physical Exam: General appearance:alert, cooperative and no distress Neurologic:intact Heart: regular rate and rhythm Lungs:Breath sounds clear. Abdomen:Soft, non-tender Extremities:Warm, well perfused. Mild pre-tibial edema. Wound:The sternal dressing was removed. The incision is dry and well approximated with staples.   Lab Results: Recent Labs    02/16/20 0224  WBC 7.4  HGB 10.1*  HCT 29.3*  PLT 286   BMET:  Recent Labs    02/15/20 0832 02/16/20 0224  NA 132* 136  K 3.6 4.1  CL 92* 95*  CO2 28 30  GLUCOSE 229* 118*  BUN 15 15  CREATININE 0.91 0.91  CALCIUM 8.4* 8.2*    PT/INR: No results for input(s): LABPROT, INR in the last 72 hours. ABG     Component Value Date/Time   PHART 7.304 (L) 02/11/2020 2225   HCO3 20.7 02/11/2020 2225   TCO2 22 02/11/2020 2225   ACIDBASEDEF 5.0 (H) 02/11/2020 2225   O2SAT 93.0 02/11/2020 2225   CBG (last 3)  Recent Labs    02/16/20 1615 02/16/20 2113 02/17/20 0615  GLUCAP 116* 108* 132*    Assessment/Plan: S/P Procedure(s) (LRB): CORONARY ARTERY BYPASS GRAFTING (CABG) using LIMA to LAD; RIMA to PL; Endoscopic right greater saphenous vein harvest: SVC to Diag; SVC to OM1. (N/A) TRANSESOPHAGEAL ECHOCARDIOGRAM (TEE) (N/A) INDOCYANINE GREEN FLUORESCENCE IMAGING (ICG) (N/A) Clipping Of Atrial Appendage using AtriCure 35 MM AtriClip. (N/A) Limited Maze Procedure using AtriCure Isolator Synergy Clamp (N/A) Endovein Harvest Of Greater Saphenous Vein using right lower extremity. (Right)  --POD6 CABG / MAZE/ Left atrial clip for MVCAD and angina pectoris. Pre-op EF ~35%. Progressing with mobility.  Continue ASA, atorvastatin, Plavix.   -Atrial fibrillation- Converted back to SR last night. Will stop IV amiodarone, continue amio 400mg  po BID. Re-check K+.  -Volume excess- Responding well to diuresis. Wt is -1kg below pre-op Wt.  Continue diuresing another day with IV Lasix  -Type 2 DM- Glucose 113-165 over past 24 hours. Continue glucotrol, metformin, Levemir, SSI.   -COPD- More short of breath last night. Will check PA-Lat CXR this AM.  Oxygen sat is 98-100%on RA. Encouraging ambulation and pulmonary hygiene.    LOS: 9 days    [name], [place] [phone] 02/17/2020

## 2020-02-18 ENCOUNTER — Telehealth: Payer: Self-pay

## 2020-02-18 LAB — BASIC METABOLIC PANEL
Anion gap: 7 (ref 5–15)
BUN: 15 mg/dL (ref 8–23)
CO2: 31 mmol/L (ref 22–32)
Calcium: 8.2 mg/dL — ABNORMAL LOW (ref 8.9–10.3)
Chloride: 96 mmol/L — ABNORMAL LOW (ref 98–111)
Creatinine, Ser: 0.98 mg/dL (ref 0.61–1.24)
GFR calc Af Amer: >60 mL/min (ref >60)
GFR calc non Af Amer: >60 mL/min (ref >60)
Glucose, Bld: 120 mg/dL — ABNORMAL HIGH (ref 70–99)
Potassium: 4 mmol/L (ref 3.5–5.1)
Sodium: 134 mmol/L — ABNORMAL LOW (ref 135–145)

## 2020-02-18 LAB — CBC
HCT: 30.1 % — ABNORMAL LOW (ref 39.0–52.0)
Hemoglobin: 10.1 g/dL — ABNORMAL LOW (ref 13.0–17.0)
MCH: 27.8 pg (ref 26.0–34.0)
MCHC: 33.6 g/dL (ref 30.0–36.0)
MCV: 82.9 fL (ref 80.0–100.0)
Platelets: 361 10*3/uL (ref 150–400)
RBC: 3.63 MIL/uL — ABNORMAL LOW (ref 4.22–5.81)
RDW: 15.4 % (ref 11.5–15.5)
WBC: 8.3 10*3/uL (ref 4.0–10.5)
nRBC: 0 % (ref 0.0–0.2)

## 2020-02-18 LAB — GLUCOSE, CAPILLARY
Glucose-Capillary: 114 mg/dL — ABNORMAL HIGH (ref 70–99)
Glucose-Capillary: 98 mg/dL (ref 70–99)

## 2020-02-18 MED ORDER — LOSARTAN POTASSIUM 25 MG PO TABS: 25.0000 mg | ORAL_TABLET | Freq: Every day | ORAL | 1 refills | Status: DC

## 2020-02-18 MED ORDER — ASPIRIN 81 MG PO CHEW: 81.0000 mg | CHEWABLE_TABLET | Freq: Every day | ORAL | Status: AC

## 2020-02-18 MED ORDER — AMIODARONE HCL 400 MG PO TABS: 400.0000 mg | ORAL_TABLET | Freq: Two times a day (BID) | ORAL | 1 refills | Status: DC

## 2020-02-18 MED ORDER — POTASSIUM CHLORIDE CRYS ER 20 MEQ PO TBCR: 20.0000 meq | EXTENDED_RELEASE_TABLET | Freq: Two times a day (BID) | ORAL | 0 refills | Status: DC

## 2020-02-18 MED ORDER — ATORVASTATIN CALCIUM 80 MG PO TABS: 80.0000 mg | ORAL_TABLET | Freq: Every day | ORAL | 1 refills | Status: DC

## 2020-02-18 MED ORDER — FUROSEMIDE 80 MG PO TABS: 80.0000 mg | ORAL_TABLET | Freq: Every day | ORAL | 0 refills | Status: DC

## 2020-02-18 MED ORDER — UMECLIDINIUM BROMIDE 62.5 MCG/INH IN AEPB
1.0000 | INHALATION_SPRAY | Freq: Every day | RESPIRATORY_TRACT | Status: DC
  Filled 2020-02-18: qty 7

## 2020-02-18 MED ORDER — METOPROLOL TARTRATE 25 MG PO TABS: 25.0000 mg | ORAL_TABLET | Freq: Two times a day (BID) | ORAL | 1 refills | Status: DC

## 2020-02-18 MED ORDER — TRAMADOL HCL 50 MG PO TABS: 50.0000 mg | ORAL_TABLET | Freq: Four times a day (QID) | ORAL | 0 refills | Status: AC | PRN

## 2020-02-18 MED ORDER — PANTOPRAZOLE SODIUM 40 MG PO TBEC: 40.0000 mg | DELAYED_RELEASE_TABLET | Freq: Every day | ORAL | 1 refills | Status: DC

## 2020-02-18 MED ORDER — UMECLIDINIUM BROMIDE 62.5 MCG/INH IN AEPB
10.0000 | INHALATION_SPRAY | Freq: Every day | RESPIRATORY_TRACT | Status: DC
  Filled 2020-02-18: qty 14

## 2020-02-18 MED ORDER — COLCHICINE 0.6 MG PO TABS: 0.6000 mg | ORAL_TABLET | Freq: Every day | ORAL | 0 refills | Status: DC

## 2020-02-18 MED ORDER — FUROSEMIDE 80 MG PO TABS
80.0000 mg | ORAL_TABLET | Freq: Every day | ORAL | Status: DC
  Administered 2020-02-18: 10:00:00 80 mg via ORAL
  Filled 2020-02-18: qty 1

## 2020-02-18 MED ORDER — CLOPIDOGREL BISULFATE 75 MG PO TABS: 75.0000 mg | ORAL_TABLET | Freq: Every day | ORAL | 1 refills | Status: DC

## 2020-02-18 MED ORDER — MAGNESIUM OXIDE 400 (241.3 MG) MG PO TABS: 800.0000 mg | ORAL_TABLET | Freq: Two times a day (BID) | ORAL | 0 refills | Status: DC

## 2020-02-18 MED ORDER — LOSARTAN POTASSIUM 25 MG PO TABS
25.0000 mg | ORAL_TABLET | Freq: Every day | ORAL | Status: DC
  Administered 2020-02-18: 25 mg via ORAL
  Filled 2020-02-18: qty 1

## 2020-02-18 NOTE — Telephone Encounter (Signed)
Left message on voicemail to return my call- need to complete TCM and hospital follow up.

## 2020-02-18 NOTE — Progress Notes (Signed)
Patient can not fit into regular rollator.  Patient will need HD rollator due to body habitus.

## 2020-02-18 NOTE — Telephone Encounter (Signed)
Transition Care Management Follow-up Telephone Call  Date of discharge and from where: [facility]/18/[facility]   How have you been since you were released from the hospital? Patient is doing good since being released home.   Any questions or concerns? Yes , Patient needs a CPAP machine and supplies prescribed by his PCP. He needs a new one ordered soon.     Items Reviewed:  Did the pt receive and understand the discharge instructions provided? Yes   Medications obtained and verified? Yes   Any new allergies since your discharge? No   Dietary orders reviewed? Yes  Do you have support at home? Yes   Functional Questionnaire: (I = Independent and D = Dependent) ADLs: I  Bathing/Dressing- I  Meal Prep- I  Eating- I  Maintaining continence- I  Transferring/Ambulation- I  Managing Meds- I  Follow up appointments reviewed:   PCP Hospital f/u appt confirmed? Patient states that he talked with Dr. Silvio Pate this morning and he said that he needs to follow up with cardiology only at the present time.   [facility] f/u appt confirmed? Yes  Scheduled to see cardiology   Are transportation arrangements needed? No  If their condition worsens, is the pt aware to call PCP or go to the Emergency Dept.? Yes  Was the patient provided with contact information for the PCP's office or ED? Yes  Was to pt encouraged to call back with questions or concerns? Yes

## 2020-02-18 NOTE — Care Management Important Message (Signed)
Important Message  Patient Details  Name: [name] MRN: [id number] Date of Birth: 1956-05-19   Medicare Important Message Given:  Yes     Memory Argue 02/18/2020, 11:08 AM

## 2020-02-18 NOTE — Progress Notes (Signed)
Pt d/c home with wife and a rollator. Discharge instructions given to both pt and wife. Answered all questions. Right PIV d/c prior to discharge.

## 2020-02-18 NOTE — TOC Transition Note (Signed)
Transition of Care [facility]) - CM/SW Discharge Note   Patient Details  Name: [name] MRN: [id number] Date of Birth: 06-Jun-1956  [facility] CM/SW Contact:  Zenon Mayo, RN Phone Number: 02/18/2020, 11:13 AM   Clinical Narrative:    Patient for dc home, will need rollator, referral made to [facility] with [employer], he will bring to room prior to dc.     Final next level of care: Home/Self Care Barriers to Discharge: No Barriers Identified   Patient Goals and CMS Choice        Discharge Placement                       Discharge Plan and Services                DME Arranged: Walker rolling with seat DME Agency: [employer] Date DME Agency Contacted: 02/18/20 Time DME Agency Contacted: 1112 Representative spoke with at DME Agency: [place]: NA          Social Determinants of Health ([place]) Interventions     Readmission Risk Interventions No flowsheet data found.

## 2020-02-18 NOTE — Progress Notes (Signed)
7 Days Post-Op Procedure(s) (LRB): CORONARY ARTERY BYPASS GRAFTING (CABG) using LIMA to LAD; RIMA to PL; Endoscopic right greater saphenous vein harvest: SVC to Diag; SVC to OM1. (N/A) TRANSESOPHAGEAL ECHOCARDIOGRAM (TEE) (N/A) INDOCYANINE GREEN FLUORESCENCE IMAGING (ICG) (N/A) Clipping Of Atrial Appendage using AtriCure 35 MM AtriClip. (N/A) Limited Maze Procedure using AtriCure Isolator Synergy Clamp (N/A) Endovein Harvest Of Greater Saphenous Vein using right lower extremity. (Right) Subjective: Rested better last night, not as short of breath.  Making slow progress with ambulation mostly limited by arthritis.    Objective: Vital signs in last 24 hours: Temp:  [97.8 F (36.6 C)-98.3 F (36.8 C)] 98 F (36.7 C) (02/26 0737) Pulse Rate:  [74-90] 78 (02/26 0431) Cardiac Rhythm: Normal sinus rhythm;Bundle branch block (02/25 1900) Resp:  [14-25] 19 (02/26 0431) BP: (110-128)/(62-83) 110/77 (02/26 0320) SpO2:  [91 %-97 %] 96 % (02/26 0431) Weight:  [127.7 kg] 127.7 kg (02/26 0320)     Intake/Output from previous day: 02/25 0701 - 02/26 0700 In: 1210 [P.O.:1210] Out: 2125 [Urine:2125] Intake/Output this shift: No intake/output data recorded.  Physical Exam: General appearance:alert, cooperative and no distress Neurologic:intact Heart: regular rate and rhythm Lungs:Breath sounds clear.  CXR continues to show central pulmonary vascular congestion.  Abdomen:Soft, non-tender Extremities:Warm, well perfused. Mild pre-tibial edema. Wound:The incision is dry and well approximated with staples.   Lab Results: Recent Labs    02/16/20 0224 02/18/20 0119  WBC 7.4 8.3  HGB 10.1* 10.1*  HCT 29.3* 30.1*  PLT 286 361   BMET:  Recent Labs    02/17/20 0751 02/18/20 0119  NA 133* 134*  K 3.5 4.0  CL 92* 96*  CO2 28 31  GLUCOSE 135* 120*  BUN 16 15  CREATININE 0.94 0.98  CALCIUM 8.6* 8.2*    PT/INR: No results for input(s): LABPROT, INR in the last 72 hours. ABG   Component Value Date/Time   PHART 7.304 (L) 02/11/2020 2225   HCO3 20.7 02/11/2020 2225   TCO2 22 02/11/2020 2225   ACIDBASEDEF 5.0 (H) 02/11/2020 2225   O2SAT 93.0 02/11/2020 2225   CBG (last 3)  Recent Labs    02/17/20 2108 02/17/20 2139 02/18/20 0642  GLUCAP 69* 119* 114*    Assessment/Plan: S/P Procedure(s) (LRB): CORONARY ARTERY BYPASS GRAFTING (CABG) using LIMA to LAD; RIMA to PL; Endoscopic right greater saphenous vein harvest: SVC to Diag; SVC to OM1. (N/A) TRANSESOPHAGEAL ECHOCARDIOGRAM (TEE) (N/A) INDOCYANINE GREEN FLUORESCENCE IMAGING (ICG) (N/A) Clipping Of Atrial Appendage using AtriCure 35 MM AtriClip. (N/A) Limited Maze Procedure using AtriCure Isolator Synergy Clamp (N/A) Endovein Harvest Of Greater Saphenous Vein using right lower extremity. (Right)  --POD7CABG / MAZE/ Left atrial clip for MVCAD and angina pectoris. Pre-op EF ~35%.Progressing with mobility.  Continue ASA, atorvastatin, Plavix. Will consider re-starting ARB.  -Atrial fibrillation- Converted back to SR on 2/24.. Will continue amio 400mg  po BID. Monitor K+.  -Volume excess- Responding well to diuresis.Wt is -3kg below pre-op Wt.  Continue diuresing with IV Lasix.  -Type 2 DM- Glucose well controlled over past 24 hours. Continue glucotrol, metformin, Levemir, SSI.   -COPD-  Oxygen sat is 98-100%on RA now but required O2 last night. Needs more diuresis. Will also resume his Spiriva.  Encouraging ambulation and pulmonary hygiene.    LOS: 10 days    Antony Odea, [place] [phone] 02/18/2020

## 2020-02-18 NOTE — Progress Notes (Signed)
Inpatient Diabetes Program Recommendations  AACE/ADA: New Consensus Statement on Inpatient Glycemic Control (2015)  Target Ranges:  Prepandial:   less than 140 mg/dL      Peak postprandial:   less than 180 mg/dL (1-2 hours)      Critically ill patients:  140 - 180 mg/dL   Lab Results  Component Value Date   GLUCAP 114 (H) 02/18/2020   HGBA1C 8.3 (H) 02/09/2020    Review of Glycemic Control Results for [name], [name] (MRN [id number]) as of 02/18/2020 09:51  Ref. Range 02/17/2020 06:15 02/17/2020 11:29 02/17/2020 15:52 02/17/2020 21:08 02/17/2020 21:39 02/18/2020 06:42  Glucose-Capillary Latest Ref Range: 70 - 99 mg/dL 132 (H) 128 (H) 93 69 (L) 119 (H) 114 (H)   Inpatient Diabetes Program Recommendations:   Noted CBG 69. -Decrease Novolog correction to sensitive tid.  Thank you, Nani Gasser. Serafina Topham, RN, MSN, CDE  Diabetes Coordinator Inpatient Glycemic Control Team Team Pager [phone] (8am-5pm) 02/18/2020 9:52 AM

## 2020-02-21 ENCOUNTER — Telehealth: Payer: Self-pay

## 2020-02-21 ENCOUNTER — Other Ambulatory Visit: Payer: Self-pay | Admitting: *Deleted

## 2020-02-21 DIAGNOSIS — G4700 Insomnia, unspecified: Secondary | ICD-10-CM

## 2020-02-21 DIAGNOSIS — G479 Sleep disorder, unspecified: Secondary | ICD-10-CM

## 2020-02-21 MED ORDER — ZOLPIDEM TARTRATE 5 MG PO TABS: 5.0000 mg | ORAL_TABLET | Freq: Every evening | ORAL | 0 refills | Status: DC | PRN

## 2020-02-21 NOTE — Telephone Encounter (Signed)
When was his last sleep study. If more than 5 years ,will need to refer to pulmonology for repeat sleep study.

## 2020-02-21 NOTE — Telephone Encounter (Signed)
Patient's wife called me and stated that patient is not sleeping well at all. Wife states he is in need of a CPAP machine asap. Please refer to [facility] call documented in chart on 02/18/20.

## 2020-02-22 NOTE — Telephone Encounter (Signed)
Spoke to pt

## 2020-02-22 NOTE — Addendum Note (Signed)
Addended by: Viviana Simpler I on: 02/22/2020 08:37 AM   Modules accepted: Orders

## 2020-02-22 NOTE — Telephone Encounter (Signed)
Please let them know I put them in for an urgent consultation

## 2020-02-24 ENCOUNTER — Other Ambulatory Visit: Payer: Self-pay | Admitting: Cardiothoracic Surgery

## 2020-02-24 DIAGNOSIS — Z951 Presence of aortocoronary bypass graft: Secondary | ICD-10-CM

## 2020-02-28 ENCOUNTER — Other Ambulatory Visit: Payer: Self-pay

## 2020-02-28 ENCOUNTER — Ambulatory Visit (INDEPENDENT_AMBULATORY_CARE_PROVIDER_SITE_OTHER): Payer: Self-pay | Admitting: Cardiothoracic Surgery

## 2020-02-28 ENCOUNTER — Ambulatory Visit
Admission: RE | Admit: 2020-02-28 | Discharge: 2020-02-28 | Disposition: A | Discharge status: Home / Self Care | Payer: BC Managed Care – PPO | Source: Ambulatory Visit | Attending: Cardiothoracic Surgery | Admitting: Cardiothoracic Surgery

## 2020-02-28 VITALS — BP 94/64 | HR 80 | Resp 20 | Ht 69.0 in | Wt 272.0 lb

## 2020-02-28 DIAGNOSIS — R0602 Shortness of breath: Secondary | ICD-10-CM | POA: Diagnosis not present

## 2020-02-28 DIAGNOSIS — Z951 Presence of aortocoronary bypass graft: Secondary | ICD-10-CM

## 2020-02-28 DIAGNOSIS — I2510 Atherosclerotic heart disease of native coronary artery without angina pectoris: Secondary | ICD-10-CM

## 2020-02-28 DIAGNOSIS — Z8679 Personal history of other diseases of the circulatory system: Secondary | ICD-10-CM

## 2020-02-28 DIAGNOSIS — Z9889 Other specified postprocedural states: Secondary | ICD-10-CM

## 2020-02-28 NOTE — Progress Notes (Signed)
[address]       [place] [address]             [phone]     CARDIOTHORACIC SURGERY OFFICE NOTE  Referring Provider is Minus Breeding, MD Primary Cardiologist is Minus Breeding, MD PCP is Venia Carbon, MD   HPI:  64 yo man s/p CABG 2 weeks ago. He did very well as inpatient except for transient atrial fib.  Presents to office today without c/o. Has lost several pounds and is feeling well. Finished Lasix dose yesterday. Denies chest pain. Sleeping/resting is better.   Current Outpatient Medications  Medication Sig Dispense Refill  . acetaminophen (TYLENOL) 500 MG tablet Take 1,000 mg by mouth every 6 (six) hours as needed (for pain.).    Marland Kitchen amiodarone (PACERONE) 400 MG tablet Take 1 tablet (400 mg total) by mouth 2 (two) times daily. For 7 days then take 400 mg daily (Patient taking differently: Take 400 mg by mouth daily. For 7 days then take 400 mg daily) 70 tablet 1  . aspirin 81 MG chewable tablet Chew 1 tablet (81 mg total) by mouth daily.    Marland Kitchen atorvastatin (LIPITOR) 80 MG tablet Take 1 tablet (80 mg total) by mouth daily at 6 PM. 30 tablet 1  . clopidogrel (PLAVIX) 75 MG tablet Take 1 tablet (75 mg total) by mouth daily. 30 tablet 1  . colchicine 0.6 MG tablet Take 1 tablet (0.6 mg total) by mouth daily. 30 tablet 0  . glipiZIDE (GLUCOTROL) 5 MG tablet Take 1 tablet (5 mg total) by mouth 2 (two) times daily before a meal. 180 tablet 3  . magnesium oxide (MAG-OX) 400 (241.3 Mg) MG tablet Take 2 tablets (800 mg total) by mouth 2 (two) times daily. 20 tablet 0  . metFORMIN (GLUCOPHAGE-XR) 500 MG 24 hr tablet TAKE 2 TABLETS BY MOUTH EVERY DAY WITH BREAKFAST (Patient taking differently: Take 1,000 mg by mouth daily with breakfast. ) 180 tablet 3  . metoprolol tartrate (LOPRESSOR) 25 MG tablet Take 1 tablet (25 mg total) by mouth 2 (two) times daily. 60 tablet 1  . Multiple Vitamin (MULTIVITAMIN WITH MINERALS) TABS tablet Take 1 tablet by mouth daily.     . pantoprazole (PROTONIX) 40 MG tablet Take 1 tablet (40 mg total) by mouth at bedtime. 30 tablet 1  . tiotropium (SPIRIVA) 18 MCG inhalation capsule Place 1 capsule (18 mcg total) into inhaler and inhale daily. 30 capsule 11  . VENTOLIN HFA 108 (90 Base) MCG/ACT inhaler TAKE 2 PUFFS BY MOUTH EVERY 6 HOURS AS NEEDED FOR WHEEZE OR SHORTNESS OF BREATH *USE W/ SPACER* (Patient taking differently: Inhale 2 puffs into the lungs every 6 (six) hours as needed for wheezing or shortness of breath. ) 18 Inhaler 1  . vitamin B-12 (CYANOCOBALAMIN) 1000 MCG tablet Take 1,000 mcg by mouth daily.     Marland Kitchen zolpidem (AMBIEN) 5 MG tablet Take 1 tablet (5 mg total) by mouth at bedtime as needed for sleep. 30 tablet 0  . potassium chloride SA (KLOR-CON) 20 MEQ tablet Take 1 tablet (20 mEq total) by mouth 2 (two) times daily. (Patient not taking: Reported on 02/28/2020) 20 tablet 0   No current facility-administered medications for this visit.      Physical Exam:   BP 94/64   Pulse 80   Resp 20   Ht 5\' 9"  (1.753 m)   Wt 123.4 kg   SpO2 96% Comment: RA  BMI 40.17  kg/m   General:  Well-appearing, NAD  Chest:   cta  CV:   rrr  Incisions:  C/d/i  Abdomen:  sntnd  Extremities:  Mild edema  Diagnostic Tests:  CXR with clear lung fields   Impression:  Doing well after CABG  Plan: F/u in 2 weeks with repeat CXR Report peripheral edema or SOB Continue to observe sternal precautions.  I spent in excess of 20 minutes during the conduct of this office consultation and >50% of this time involved direct face-to-face encounter with the patient for counseling and/or coordination of their care.  Level 2                 10 minutes Level 3                 15 minutes Level 4                 25 minutes Level 5                 40 minutes  B. Murvin Natal, MD 02/28/2020 12:47 PM

## 2020-03-01 ENCOUNTER — Encounter: Payer: Self-pay | Admitting: Cardiology

## 2020-03-01 DIAGNOSIS — I480 Paroxysmal atrial fibrillation: Secondary | ICD-10-CM | POA: Insufficient documentation

## 2020-03-01 NOTE — Progress Notes (Signed)
Cardiology Office Note   Date:  03/02/2020   ID:  [name] December 30, 1955, MRN [id number]  PCP:  Venia Carbon, MD  Cardiologist:   Minus Breeding, MD  Chief Complaint  Patient presents with  . Coronary Artery Disease      History of Present Illness: [name] is a 64 y.o. male who presents who presents post CABG.  He had a history of CAD with cath in 2012demonstrating LAD a 60-70% stenosis, moderate size second diagonal with 50% stenosis, circumflex AV groove 75-80% stenosis with a branching obtuse marginal with a superior branch subtotal stenosis followed by diffuse disease, right coronary artery had nonobstructive disease. EF was 35%. He had a ICD placed, and at [facility], Dr.Taylor elected not to replace it. In Jan 2019he had an inferior MI . He underwent urgent DES to to the mid RCA and occluded left circumflex. EF on echo was 40 - 45%. He had some residual disease in the LAD and the plan was for out patient stress testing with PCIIf abnormal..He had a AAA repair 04/2018.  On 12/21/2019 the patient was seen in the ED with cough and was found to be positive for COVID-19.  He presented in Feb with chest pain.   He was sent for cath and had 3 vessel CAD and underwent CABG  X 4 by Dr. Orvan Seen.   He is actually done relatively well.  Has had some chest soreness but has not had any of the chest pain that was his unstable angina.  He has had no new shortness of breath, PND or orthopnea.  He said no fevers or chills.  He is ambulating with a rolling walker but his wife says he is walking better.     Past Medical History:  Diagnosis Date  . Arthritis   . Automatic implantable cardioverter-defibrillator in situ   . COPD (chronic obstructive pulmonary disease) ([place])    emphysema by CXR  . Coronary artery disease   . Diabetes mellitus    type 2  no meds  . Diverticulosis of colon   . GERD (gastroesophageal reflux disease)    with esophagitis  . History of colonic polyps    . Hypertension   . Non-ischemic cardiomyopathy ([place])   . Presence of permanent cardiac pacemaker   . PVD (peripheral vascular disease) ([place])    bilateral common iliac artery aneurysms. Right SFA occlusion over a long segment. Left SFA disease with occlusion of the left TP trunk. Artirogram Oct. 2006  . Sleep apnea   . Tobacco abuse   . Urinary incontinence    detrussor instability    Past Surgical History:  Procedure Laterality Date  . ABDOMINAL AORTIC ENDOVASCULAR STENT GRAFT N/A 05/07/2018   Procedure: ABDOMINAL AORTIC ENDOVASCULAR STENT GRAFT;  Surgeon: Conrad Hickory, MD;  Location: [facility];  Service: Vascular;  Laterality: N/A;  . CARDIAC CATHETERIZATION  06/2004   negative  . CARDIAC CATHETERIZATION  02/08/2020  . CLIPPING OF ATRIAL APPENDAGE N/A 02/11/2020   Procedure: Clipping Of Atrial Appendage using AtriCure 35 MM AtriClip.;  Surgeon: Wonda Olds, MD;  Location: [facility];  Service: Open Heart Surgery;  Laterality: N/A;  . COLONOSCOPY  04/2005  . COPD exacerbation    . CORONARY ANGIOPLASTY  01/09/2018  . CORONARY ARTERY BYPASS GRAFT N/A 02/11/2020   Procedure: CORONARY ARTERY BYPASS GRAFTING (CABG) using LIMA to LAD; RIMA to PL; Endoscopic right greater saphenous vein harvest: SVC to Diag; SVC to OM1.;  Surgeon: Wonda Olds, MD;  Location: [facility];  Service: Open Heart Surgery;  Laterality: N/A;  BILATERAL IMA  . CORONARY STENT INTERVENTION N/A 01/10/2018   Procedure: CORONARY STENT INTERVENTION;  Surgeon: Lorretta Harp, MD;  Location: [facility];  Service: Cardiovascular;  Laterality: N/A;  . CORONARY/GRAFT ACUTE MI REVASCULARIZATION N/A 01/10/2018   Procedure: Coronary/Graft Acute MI Revascularization;  Surgeon: Lorretta Harp, MD;  Location: [facility];  Service: Cardiovascular;  Laterality: N/A;  . EMBOLIZATION Left 03/19/2018  . EMBOLIZATION Left 03/19/2018   Procedure: EMBOLIZATION - Left Internal;  Surgeon: Conrad [place], MD;  Location: [facility];  Service: Cardiovascular;  Laterality: Left;  . EMBOLIZATION Right 04/16/2018   Procedure: EMBOLIZATION;  Surgeon: Conrad Advance, MD;  Location: [facility];  Service: Cardiovascular;  Laterality: Right;  . ENDOVEIN HARVEST OF GREATER SAPHENOUS VEIN Right 02/11/2020   Procedure: [place] Of Greater Saphenous Vein using right lower extremity.;  Surgeon: Wonda Olds, MD;  Location[facility];  Service: Open Heart Surgery;  Laterality: Right;  . FEMORAL ARTERY EXPLORATION Right 05/07/2018   Procedure: FEMORAL ARTERY EXPLORATION, EXTENDED PROFUNDAPLASTY;  Surgeon: Conrad Woodway, MD;  Location: [facility];  Service: Vascular;  Laterality: Right;  . INTRAOPERATIVE ARTERIOGRAM Right 05/07/2018   Procedure: INTRA OPERATIVE ARTERIOGRAM;  Surgeon: Conrad Gray, MD;  Location: [facility] [facility];  Service: Vascular;  Laterality: Right;  . LEFT HEART CATH AND CORONARY ANGIOGRAPHY N/A 01/10/2018   Procedure: LEFT HEART CATH AND CORONARY ANGIOGRAPHY;  Surgeon: Lorretta Harp, MD;  Location: [facility];  Service: Cardiovascular;  Laterality: N/A;  . LEFT HEART CATH AND CORONARY ANGIOGRAPHY N/A 02/08/2020   Procedure: LEFT HEART CATH AND CORONARY ANGIOGRAPHY;  Surgeon: Troy Sine, MD;  Location: [facility];  Service: Cardiovascular;  Laterality: N/A;  . PACEMAKER INSERTION  11/2005  . PACEMAKER LEAD REMOVAL N/A 10/17/2014   Procedure: PACEMAKER LEAD REMOVAL;  Surgeon: Evans Lance, MD;  Location: [facility];  Service: Cardiovascular;  Laterality: N/A;  . RENAL ANGIOGRAPHY Left 04/16/2018   Procedure: RENAL ANGIOGRAPHY;  Surgeon: Conrad , MD;  Location: [facility];  Service: Cardiovascular;  Laterality: Left;  . s/p ICD placement      Medtronic Maximo #7232 single chamber  . TEE WITHOUT CARDIOVERSION N/A 02/11/2020   Procedure: TRANSESOPHAGEAL ECHOCARDIOGRAM (TEE);  Surgeon: Wonda Olds, MD;  Location: [facility];  Service: Open Heart Surgery;  Laterality: N/A;      Current Outpatient Medications  Medication Sig Dispense Refill  . acetaminophen (TYLENOL) 500 MG tablet Take 1,000 mg by mouth every 6 (six) hours as needed (for pain.).    Marland Kitchen amiodarone (PACERONE) 200 MG tablet Take 1 tablet (200 mg total) by mouth daily. For 7 days then take 400 mg daily 90 tablet 3  . aspirin 81 MG chewable tablet Chew 1 tablet (81 mg total) by mouth daily.    Marland Kitchen atorvastatin (LIPITOR) 80 MG tablet Take 1 tablet (80 mg total) by mouth daily at 6 PM. 30 tablet 1  . clopidogrel (PLAVIX) 75 MG tablet Take 1 tablet (75 mg total) by mouth daily. 30 tablet 1  . colchicine 0.6 MG tablet Take 1 tablet (0.6 mg total) by mouth daily. 30 tablet 0  . glipiZIDE (GLUCOTROL) 5 MG tablet Take 1 tablet (5 mg total) by mouth 2 (two) times daily before a meal. 180 tablet 3  . magnesium oxide (MAG-OX)  400 (241.3 Mg) MG tablet Take 2 tablets (800 mg total) by mouth 2 (two) times daily. 20 tablet 0  . metFORMIN (GLUCOPHAGE-XR) 500 MG 24 hr tablet TAKE 2 TABLETS BY MOUTH EVERY DAY WITH BREAKFAST (Patient taking differently: Take 1,000 mg by mouth daily with breakfast. ) 180 tablet 3  . metoprolol tartrate (LOPRESSOR) 25 MG tablet Take 1 tablet (25 mg total) by mouth 2 (two) times daily. 60 tablet 1  . Multiple Vitamin (MULTIVITAMIN WITH MINERALS) TABS tablet Take 1 tablet by mouth daily.    . pantoprazole (PROTONIX) 40 MG tablet Take 1 tablet (40 mg total) by mouth at bedtime. 30 tablet 1  . tiotropium (SPIRIVA) 18 MCG inhalation capsule Place 1 capsule (18 mcg total) into inhaler and inhale daily. 30 capsule 11  . VENTOLIN HFA 108 (90 Base) MCG/ACT inhaler TAKE 2 PUFFS BY MOUTH EVERY 6 HOURS AS NEEDED FOR WHEEZE OR SHORTNESS OF BREATH *USE W/ SPACER* (Patient taking differently: Inhale 2 puffs into the lungs every 6 (six) hours as needed for wheezing or shortness of breath. ) 18 Inhaler 1  . vitamin B-12 (CYANOCOBALAMIN) 1000 MCG tablet Take 1,000 mcg by mouth daily.     Marland Kitchen zolpidem (AMBIEN) 5 MG  tablet Take 1 tablet (5 mg total) by mouth at bedtime as needed for sleep. 30 tablet 0   No current facility-administered medications for this visit.    Allergies:   Patient has no known allergies.    ROS:  Please see the history of present illness.   Otherwise, review of systems are positive for none.   All other systems are reviewed and negative.    PHYSICAL EXAM: VS:  BP 110/74   Ht 5\' 9"  (1.753 m)   Wt 274 lb 9.6 oz (124.6 kg)   SpO2 97%   BMI 40.55 kg/m  , BMI Body mass index is 40.55 kg/m. GENERAL:  Well appearing NECK:  No jugular venous distention, waveform within normal limits, carotid upstroke brisk and symmetric, no bruits, no thyromegaly LUNGS:  Clear to auscultation bilaterally CHEST:   Healing sternotomy scar. HEART:  PMI not displaced or sustained,S1 and S2 within normal limits, no S3, no S4, no clicks, no rubs, no murmurs ABD:  Flat, positive bowel sounds normal in frequency in pitch, no bruits, no rebound, no guarding, no midline pulsatile mass, mild right leg edema, no splenomegaly EXT:  2 plus pulses throughout, no edema, no cyanosis no clubbing   EKG:  EKG is not ordered today.    Recent Labs: 12/10/2019: ALT 27 02/15/2020: Magnesium 1.7 02/18/2020: BUN 15; Creatinine, Ser 0.98; Hemoglobin 10.1; Platelets 361; Potassium 4.0; Sodium 134    Lipid Panel    Component Value Date/Time   CHOL 125 12/10/2019 1131   CHOL 156 08/25/2018 1154   TRIG 307.0 (H) 12/10/2019 1131   TRIG 575 (HH) 01/02/2007 0000   HDL 37.60 (L) 12/10/2019 1131   HDL 41 08/25/2018 1154   CHOLHDL 3 12/10/2019 1131   VLDL 61.4 (H) 12/10/2019 1131   LDLCALC 35 08/25/2018 1154   LDLDIRECT 26.0 12/10/2019 1131      Wt Readings from Last 3 Encounters:  03/02/20 274 lb 9.6 oz (124.6 kg)  02/28/20 272 lb (123.4 kg)  02/18/20 281 lb 8.4 oz (127.7 kg)      Other studies Reviewed: Additional studies/ records that were reviewed today include: Hospital records. Review of the above  records demonstrates:  Please see elsewhere in the note.     ASSESSMENT AND  PLAN:  CAD/CABG:    He has follow up CXR scheduled.  I have signed him up for cardiac rehab.  We discussed a much more aggressive approach towards his secondary risk reduction and I think finally he might be on board.  For now I am going to continue dual antiplatelet therapy.  Although will probably stop Plavix in the future.  ATRIAL FIB:  He did have transient atrial fib. He had a MAZE procedure.  He was sent home on amiodarone.  I reduce this to 200 mg and he can continue this through the end of the month.  This was a postoperative rhythm only.  HTN: His blood pressure is controlled and he will continue the meds as listed.  DYSLIPIDEMIA: He will continue high-dose statin.  MORBID OBESITY: I gave him a specific goal of weight loss of 10 pounds in the next 3 months.  ICD:   This was explanted.    COVID EDUCATION: He had Covid but we did discuss getting the vaccine.  I would suggest that before starting rehab.     Current medicines are reviewed at length with the patient today.  The patient does not have concerns regarding medicines.  The following changes have been made:  As above  Labs/ tests ordered today include: None  Orders Placed This Encounter  Procedures  . AMB referral to cardiac rehabilitation     Disposition:   FU with me in 3 months.     Signed, Minus Breeding, MD  03/02/2020 4:22 PM    [facility]

## 2020-03-02 ENCOUNTER — Encounter: Payer: Self-pay | Admitting: Cardiology

## 2020-03-02 ENCOUNTER — Other Ambulatory Visit: Payer: Self-pay

## 2020-03-02 ENCOUNTER — Ambulatory Visit (INDEPENDENT_AMBULATORY_CARE_PROVIDER_SITE_OTHER): Payer: BC Managed Care – PPO | Admitting: Cardiology

## 2020-03-02 VITALS — BP 110/74 | Ht 69.0 in | Wt 274.6 lb

## 2020-03-02 DIAGNOSIS — Z7189 Other specified counseling: Secondary | ICD-10-CM

## 2020-03-02 DIAGNOSIS — I480 Paroxysmal atrial fibrillation: Secondary | ICD-10-CM | POA: Diagnosis not present

## 2020-03-02 DIAGNOSIS — I2510 Atherosclerotic heart disease of native coronary artery without angina pectoris: Secondary | ICD-10-CM

## 2020-03-02 DIAGNOSIS — I200 Unstable angina: Secondary | ICD-10-CM

## 2020-03-02 DIAGNOSIS — E785 Hyperlipidemia, unspecified: Secondary | ICD-10-CM | POA: Diagnosis not present

## 2020-03-02 DIAGNOSIS — I10 Essential (primary) hypertension: Secondary | ICD-10-CM

## 2020-03-02 DIAGNOSIS — Z951 Presence of aortocoronary bypass graft: Secondary | ICD-10-CM | POA: Diagnosis not present

## 2020-03-02 MED ORDER — AMIODARONE HCL 200 MG PO TABS: 200.0000 mg | ORAL_TABLET | Freq: Every day | ORAL | 3 refills | Status: DC

## 2020-03-02 NOTE — Patient Instructions (Addendum)
Medication Instructions:  Decrease Amiodarone to 200mg  daily *If you need a refill on your cardiac medications before your next appointment, please call your pharmacy*  Lab Work: None needed this visit  Testing/Procedures: None needed this visit  Follow-Up: At [facility], you and your health needs are our priority.  As part of our continuing mission to provide you with exceptional heart care, we have created designated Provider Care Teams.  These Care Teams include your primary Cardiologist (physician) and Advanced Practice Providers (APPs -  Physician Assistants and Nurse Practitioners) who all work together to provide you with the care you need, when you need it.  We recommend signing up for the patient portal called "MyChart".  Sign up information is provided on this After Visit Summary.  MyChart is used to connect with patients for Virtual Visits (Telemedicine).  Patients are able to view lab/test results, encounter notes, upcoming appointments, etc.  Non-urgent messages can be sent to your provider as well.   To learn more about what you can do with MyChart, go to [email or web address].    Your next appointment:   3 month(s)  The format for your next appointment:   In Person  Provider:   Minus Breeding, MD  Other Instructions Referred to cardiac rehab

## 2020-03-06 ENCOUNTER — Telehealth (HOSPITAL_COMMUNITY): Payer: Self-pay

## 2020-03-06 NOTE — Telephone Encounter (Signed)
Pt insurance is active and benefits verified through [employer] Co-pay 0, DED 0/0 met, out of pocket $7,350/$5,052.22 met, co-insurance 30%. no pre-authorization required, REF# [id number]  Will contact patient to see if he is interested in the Cardiac Rehab Program. If interested, patient will need to complete follow up appt. Once completed, patient will be contacted for scheduling upon review by the RN Navigator.  2ndary insurance is active and benefits verified through Medicare a/b. Co-pay 0, DED $203/$194.50 met, out of pocket 0/0 met, co-insurance 20%. No pre-authorization required. Passport, 03/06/2020_0 :53am, REF# [id number]

## 2020-03-08 ENCOUNTER — Other Ambulatory Visit: Payer: Self-pay

## 2020-03-08 ENCOUNTER — Ambulatory Visit (INDEPENDENT_AMBULATORY_CARE_PROVIDER_SITE_OTHER): Payer: BC Managed Care – PPO | Admitting: Pulmonary Disease

## 2020-03-08 ENCOUNTER — Encounter: Payer: Self-pay | Admitting: Pulmonary Disease

## 2020-03-08 VITALS — BP 126/76 | HR 71 | Temp 97.2°F | Ht 69.0 in | Wt 274.6 lb

## 2020-03-08 DIAGNOSIS — G4733 Obstructive sleep apnea (adult) (pediatric): Secondary | ICD-10-CM

## 2020-03-08 NOTE — Progress Notes (Signed)
Subjective:    Patient ID: [name], male    DOB: [birth year], 64 y.o.   MRN: [id number]  Patient is being seen for obstructive sleep apnea  Diagnosed with obstructive sleep apnea about 2005 He did use CPAP for a while but was having difficulty tolerating CPAP because of the mask  Has not used CPAP the last couple years  He was recently hospitalized about 3 weeks ago and notably was having apneic episodes Was also kicking around a lot The description appears like it is around apnea termination  He has a lot of muscle pain and discomfort, degenerated spine Prefers to sleep in a recliner  Usually goes to bed about 8 PM May sleep a couple of hours and then wakes up almost on an hourly basis Final awakening time about 8:30 AM  Sleep is nonrestorative He has snoring, denies headaches He naps a lot  He has obstructive lung disease for which he uses albuterol Does not recollect having a breathing study done in the past  He does have hypertension, coronary artery disease, heart failure, diabetes, emphysema  He has lately managed to lose about 14 pounds   Past Medical History:  Diagnosis Date  . Arthritis   . Automatic implantable cardioverter-defibrillator in situ   . COPD (chronic obstructive pulmonary disease) ([place])    emphysema by CXR  . Coronary artery disease   . Diabetes mellitus    type 2  no meds  . Diverticulosis of colon   . GERD (gastroesophageal reflux disease)    with esophagitis  . History of colonic polyps   . Hypertension   . Non-ischemic cardiomyopathy ([place])   . Presence of permanent cardiac pacemaker   . PVD (peripheral vascular disease) ([place])    bilateral common iliac artery aneurysms. Right SFA occlusion over a long segment. Left SFA disease with occlusion of the left TP trunk. Artirogram Oct. 2006  . Sleep apnea   . Tobacco abuse   . Urinary incontinence    detrussor instability   Social History   Socioeconomic History  . Marital status:  Married    Spouse name: Not on file  . Number of children: 2  . Years of education: Not on file  . Highest education level: Not on file  Occupational History  . Occupation: Grave digger--now disabled  Tobacco Use  . Smoking status: Former Smoker    Packs/day: 2.00    Years: 15.00    Pack years: 30.00    Types: Cigarettes    Quit date: 01/09/2018    Years since quitting: 2.1  . Smokeless tobacco: Never Used  . Tobacco comment: GAVE 1-800-QUIT-NOW  Substance and Sexual Activity  . Alcohol use: No  . Drug use: No  . Sexual activity: Not Currently  Other Topics Concern  . Not on file  Social History Narrative   No living will   Requests wife as health care POA   Would accept resuscitation but doesn't want prolonged ventilation   No tube feeds if cognitively unaware   Social Determinants of Health   Financial Resource Strain:   . Difficulty of Paying Living Expenses:   Food Insecurity:   . Worried About Charity fundraiser in the Last Year:   . Arboriculturist in the Last Year:   Transportation Needs:   . Film/video editor (Medical):   Marland Kitchen Lack of Transportation (Non-Medical):   Physical Activity:   . Days of Exercise per Week:   .  Minutes of Exercise per Session:   Stress:   . Feeling of Stress :   Social Connections:   . Frequency of Communication with Friends and Family:   . Frequency of Social Gatherings with Friends and Family:   . Attends Religious Services:   . Active Member of Clubs or Organizations:   . Attends Archivist Meetings:   Marland Kitchen Marital Status:   Intimate Partner Violence:   . Fear of Current or Ex-Partner:   . Emotionally Abused:   Marland Kitchen Physically Abused:   . Sexually Abused:    Family History  Problem Relation Age of Onset  . Stroke Father   . Coronary artery disease Maternal Aunt   . Heart failure Maternal Aunt   . Lung cancer Maternal Aunt   . Lung cancer Maternal Uncle   . Cancer Brother        Mouth  . Cancer Sister         throat   Review of Systems  Constitutional: Negative for fever and unexpected weight change.  HENT: Negative for congestion, dental problem, ear pain, nosebleeds, postnasal drip, rhinorrhea, sinus pressure, sneezing, sore throat and trouble swallowing.   Eyes: Negative for redness and itching.  Respiratory: Positive for cough and shortness of breath. Negative for chest tightness and wheezing.   Cardiovascular: Negative for palpitations and leg swelling.  Gastrointestinal: Negative for nausea and vomiting.  Genitourinary: Negative for dysuria.  Musculoskeletal: Negative for joint swelling.  Skin: Negative for rash.  Allergic/Immunologic: Negative.  Negative for environmental allergies, food allergies and immunocompromised state.  Neurological: Negative for headaches.  Hematological: Does not bruise/bleed easily.  Psychiatric/Behavioral: Negative for dysphoric mood. The patient is not nervous/anxious.        Objective:   Physical Exam Vitals:   03/08/20 0924  BP: 126/76  Pulse: 71  Temp: (!) 97.2 F (36.2 C)  SpO2: 96%   Results of the Epworth flowsheet 03/08/2020  Sitting and reading 3  Watching TV 3  Sitting, inactive in a public place (e.g. a theatre or a meeting) 0  As a passenger in a car for an hour without a break 0  Lying down to rest in the afternoon when circumstances permit 3  Sitting and talking to someone 0  Sitting quietly after a lunch without alcohol 3  In a car, while stopped for a few minutes in traffic 0  Total score 12   Recent pulmonary function study shows moderate obstructive disease on spirometry    .  Previous study shows moderate obstructive sleep apnea Assessment & Plan:  .  Moderate obstructive sleep apnea .  Excessive daytime sleepiness .  Obesity .  Chronic obstructive pulmonary disease  Pathophysiology of sleep disordered breathing discussed with the patient Treatment options discussed  Patient does require a sleep study , I believe will  be best served by a split-night study  -He has significant underlying lung disease -Underlying coronary artery disease  We did discuss a home sleep study-I do not believe this will be optimal for him unless we do not have a choice  Encouraged to continue using his inhalers-currently on Spiriva and albuterol -Inhaler technique was reviewed  I will see him back in about 6 to 8 weeks  Encouraged to call with any significant concerns

## 2020-03-08 NOTE — Addendum Note (Signed)
Addended by: Tery Sanfilippo R on: 03/08/2020 10:28 AM   Modules accepted: Orders

## 2020-03-08 NOTE — Patient Instructions (Addendum)
Split-night study for obstructive sleep apnea  Breathing study  I will see you back in 6 to 8 weeks

## 2020-03-09 ENCOUNTER — Other Ambulatory Visit: Payer: Self-pay | Admitting: Cardiothoracic Surgery

## 2020-03-09 DIAGNOSIS — Z951 Presence of aortocoronary bypass graft: Secondary | ICD-10-CM

## 2020-03-10 ENCOUNTER — Other Ambulatory Visit: Payer: Self-pay | Admitting: Internal Medicine

## 2020-03-11 ENCOUNTER — Other Ambulatory Visit: Payer: Self-pay | Admitting: Surgical

## 2020-03-13 ENCOUNTER — Other Ambulatory Visit: Payer: Self-pay

## 2020-03-13 ENCOUNTER — Ambulatory Visit
Admission: RE | Admit: 2020-03-13 | Discharge: 2020-03-13 | Disposition: A | Discharge status: Home / Self Care | Payer: BC Managed Care – PPO | Source: Ambulatory Visit | Attending: Cardiothoracic Surgery | Admitting: Cardiothoracic Surgery

## 2020-03-13 ENCOUNTER — Ambulatory Visit (INDEPENDENT_AMBULATORY_CARE_PROVIDER_SITE_OTHER): Payer: Self-pay | Admitting: Cardiothoracic Surgery

## 2020-03-13 VITALS — BP 124/79 | HR 80 | Resp 20 | Ht 69.0 in | Wt 274.0 lb

## 2020-03-13 DIAGNOSIS — Z951 Presence of aortocoronary bypass graft: Secondary | ICD-10-CM

## 2020-03-13 DIAGNOSIS — Z9889 Other specified postprocedural states: Secondary | ICD-10-CM

## 2020-03-13 DIAGNOSIS — I2510 Atherosclerotic heart disease of native coronary artery without angina pectoris: Secondary | ICD-10-CM

## 2020-03-13 DIAGNOSIS — Z8679 Personal history of other diseases of the circulatory system: Secondary | ICD-10-CM

## 2020-03-13 DIAGNOSIS — J90 Pleural effusion, not elsewhere classified: Secondary | ICD-10-CM | POA: Diagnosis not present

## 2020-03-14 NOTE — Progress Notes (Signed)
[address]       [place] [address]             [phone]     CARDIOTHORACIC SURGERY OFFICE NOTE  Referring Provider is Minus Breeding, MD Primary Cardiologist is Minus Breeding, MD PCP is Venia Carbon, MD   HPI:  64 yo man s/p CABG one month ago. He has made great progress. Returns to office for 2nd outpatient visit since surgery. He states he's walking more than he has in years! No chest pain or shortness of breath.    Current Outpatient Medications  Medication Sig Dispense Refill  . acetaminophen (TYLENOL) 500 MG tablet Take 1,000 mg by mouth every 6 (six) hours as needed (for pain.).    Marland Kitchen amiodarone (PACERONE) 200 MG tablet Take 1 tablet (200 mg total) by mouth daily. For 7 days then take 400 mg daily 90 tablet 3  . aspirin 81 MG chewable tablet Chew 1 tablet (81 mg total) by mouth daily.    Marland Kitchen atorvastatin (LIPITOR) 80 MG tablet Take 1 tablet (80 mg total) by mouth daily at 6 PM. 30 tablet 1  . clopidogrel (PLAVIX) 75 MG tablet Take 1 tablet (75 mg total) by mouth daily. 30 tablet 1  . colchicine 0.6 MG tablet Take 1 tablet (0.6 mg total) by mouth daily. 30 tablet 0  . glipiZIDE (GLUCOTROL) 5 MG tablet Take 1 tablet (5 mg total) by mouth 2 (two) times daily before a meal. 180 tablet 3  . magnesium oxide (MAG-OX) 400 (241.3 Mg) MG tablet Take 2 tablets (800 mg total) by mouth 2 (two) times daily. 20 tablet 0  . metFORMIN (GLUCOPHAGE-XR) 500 MG 24 hr tablet TAKE 2 TABLETS BY MOUTH EVERY DAY WITH BREAKFAST (Patient taking differently: Take 1,000 mg by mouth daily with breakfast. ) 180 tablet 3  . metoprolol tartrate (LOPRESSOR) 25 MG tablet Take 1 tablet (25 mg total) by mouth 2 (two) times daily. 60 tablet 1  . Multiple Vitamin (MULTIVITAMIN WITH MINERALS) TABS tablet Take 1 tablet by mouth daily.    . pantoprazole (PROTONIX) 40 MG tablet Take 1 tablet (40 mg total) by mouth at bedtime. 30 tablet 1  . SPIRIVA HANDIHALER 18 MCG inhalation capsule  INHALE 1 CAPSULE VIA HANDIHALER ONCE DAILY AT THE SAME TIME EVERY DAY 30 capsule 11  . VENTOLIN HFA 108 (90 Base) MCG/ACT inhaler TAKE 2 PUFFS BY MOUTH EVERY 6 HOURS AS NEEDED FOR WHEEZE OR SHORTNESS OF BREATH *USE W/ SPACER* (Patient taking differently: Inhale 2 puffs into the lungs every 6 (six) hours as needed for wheezing or shortness of breath. ) 18 Inhaler 1  . vitamin B-12 (CYANOCOBALAMIN) 1000 MCG tablet Take 1,000 mcg by mouth daily.     Marland Kitchen zolpidem (AMBIEN) 5 MG tablet Take 1 tablet (5 mg total) by mouth at bedtime as needed for sleep. 30 tablet 0   No current facility-administered medications for this visit.      Physical Exam:   BP 124/79   Pulse 80   Resp 20   Ht 5\' 9"  (1.753 m)   Wt 124.3 kg   SpO2 97% Comment: RA  BMI 40.46 kg/m   General:  Well-appearing, NAD  Chest:   cta  CV:   rrr  Incisions:  Well-healed  Abdomen:  Mildly obese; sntnd  Extremities:  Minimal edema  Diagnostic Tests:  cxr with clear lung fields   Impression:  Doing very well after CABG  Plan:  OK to resume normal activity including driving F/u with primary care/cardiology  I spent in excess of 15  minutes during the conduct of this office consultation and >50% of this time involved direct face-to-face encounter with the patient for counseling and/or coordination of their care.  Level 2                 10 minutes Level 3                 15 minutes Level 4                 25 minutes Level 5                 40 minutes  B. Murvin Natal, MD 03/14/2020 12:06 PM

## 2020-03-17 DIAGNOSIS — I4819 Other persistent atrial fibrillation: Secondary | ICD-10-CM | POA: Diagnosis not present

## 2020-03-27 ENCOUNTER — Other Ambulatory Visit: Payer: Self-pay

## 2020-03-27 MED ORDER — METOPROLOL TARTRATE 25 MG PO TABS: 25.0000 mg | ORAL_TABLET | Freq: Two times a day (BID) | ORAL | 3 refills | Status: DC

## 2020-03-27 NOTE — Telephone Encounter (Signed)
Received fax from [facility]. Metoprolol refill request approved.

## 2020-03-31 ENCOUNTER — Other Ambulatory Visit (HOSPITAL_COMMUNITY)
Admission: RE | Admit: 2020-03-31 | Discharge: 2020-03-31 | Disposition: A | Discharge status: Home / Self Care | Payer: BC Managed Care – PPO | Source: Ambulatory Visit | Attending: Pulmonary Disease | Admitting: Pulmonary Disease

## 2020-03-31 DIAGNOSIS — Z01812 Encounter for preprocedural laboratory examination: Secondary | ICD-10-CM | POA: Insufficient documentation

## 2020-03-31 DIAGNOSIS — Z20822 Contact with and (suspected) exposure to covid-19: Secondary | ICD-10-CM | POA: Insufficient documentation

## 2020-04-01 ENCOUNTER — Other Ambulatory Visit: Payer: Self-pay | Admitting: Surgical

## 2020-04-01 LAB — SARS CORONAVIRUS 2 (TAT 6-24 HRS): SARS Coronavirus 2: NEGATIVE

## 2020-04-02 ENCOUNTER — Ambulatory Visit (HOSPITAL_BASED_OUTPATIENT_CLINIC_OR_DEPARTMENT_OTHER): Payer: BC Managed Care – PPO | Attending: Pulmonary Disease | Admitting: Pulmonary Disease

## 2020-04-02 ENCOUNTER — Other Ambulatory Visit: Payer: Self-pay

## 2020-04-02 DIAGNOSIS — G4733 Obstructive sleep apnea (adult) (pediatric): Secondary | ICD-10-CM | POA: Insufficient documentation

## 2020-04-02 DIAGNOSIS — R0902 Hypoxemia: Secondary | ICD-10-CM | POA: Diagnosis not present

## 2020-04-02 DIAGNOSIS — Z6841 Body Mass Index (BMI) 40.0 and over, adult: Secondary | ICD-10-CM | POA: Insufficient documentation

## 2020-04-02 DIAGNOSIS — I10 Essential (primary) hypertension: Secondary | ICD-10-CM | POA: Insufficient documentation

## 2020-04-02 DIAGNOSIS — E119 Type 2 diabetes mellitus without complications: Secondary | ICD-10-CM | POA: Insufficient documentation

## 2020-04-02 DIAGNOSIS — E669 Obesity, unspecified: Secondary | ICD-10-CM | POA: Diagnosis not present

## 2020-04-02 DIAGNOSIS — G4731 Primary central sleep apnea: Secondary | ICD-10-CM | POA: Insufficient documentation

## 2020-04-09 ENCOUNTER — Telehealth: Payer: Self-pay | Admitting: Pulmonary Disease

## 2020-04-09 DIAGNOSIS — G4733 Obstructive sleep apnea (adult) (pediatric): Secondary | ICD-10-CM | POA: Diagnosis not present

## 2020-04-09 NOTE — Telephone Encounter (Signed)
Sleep study results  Date of study 04/02/2020  Impression: Severe obstructive sleep apnea Central sleep apnea  Recommendation: CPAP titration to determine optimal pressure required to alleviate sleep disordered breathing. BiPAP or ASV titration may be required to eliminate central sleep apnea.  Auto titrating CPAP may be suboptimal as option of treatment.  Close clinical follow-up for optimization of treatment

## 2020-04-09 NOTE — Procedures (Signed)
POLYSOMNOGRAPHY  Last, First: [name], [name] MRN: [id number] Gender: Male Age (years): 64 Weight (lbs): 275 DOB: 11/11/1956 BMI: 41 Primary Care: No PCP Epworth Score: 7 Referring: Laurin Coder MD Technician: Baxter Flattery Interpreting: Laurin Coder MD Study Type: NPSG Ordered Study Type: Split Night CPAP Study date: 04/02/2020 Location: [place] CLINICAL INFORMATION [name] is a 64 year old Male and was referred to the sleep center for evaluation of G47.80 Other Sleep Disorders. Indications include Diabetes, Fatigue, Hypertension, Obesity, Snoring, Witnesses Apnea / Gasping During Sleep.  MEDICATIONS Patient self administered medications include: N/A. Medications administered during study include No sleep medicine administered.  SLEEP STUDY TECHNIQUE A multi-channel overnight Polysomnography study was performed. The channels recorded and monitored were central and occipital EEG, electrooculogram (EOG), submentalis EMG (chin), nasal and oral airflow, thoracic and abdominal wall motion, anterior tibialis EMG, snore microphone, electrocardiogram, and a pulse oximetry. TECHNICIAN COMMENTS Comments added by Technician: Patient had difficulty initiating sleep. Patient was restless all through the night. Comments added by Scorer: N/A SLEEP ARCHITECTURE The study was initiated at 10:03:21 PM and terminated at 4:38:35 AM. The total recorded time was 395.2 minutes. EEG confirmed total sleep time was 151.5 minutes yielding a sleep efficiency of 38.3%%. Sleep onset after lights out was 67.0 minutes with a REM latency of 69.0 minutes. The patient spent 2.3%% of the night in stage N1 sleep, 93.4%% in stage N2 sleep, 0.0%% in stage N3 and 4.3% in REM. Wake after sleep onset (WASO) was 176.7 minutes. The Arousal Index was 6.7/hour. RESPIRATORY PARAMETERS There were a total of 149 respiratory disturbances out of which 113 were apneas ( 68 obstructive, 12 mixed, 33 central) and 36  hypopneas. The apnea/hypopnea index (AHI) was 59.0 events/hour. The central sleep apnea index was 13.1 events/hour. The REM AHI was 27.7 events/hour and NREM AHI was 60.4 events/hour. The supine AHI was 80.0 events/hour and the non supine AHI was 57.7 supine during 5.94% of sleep. Respiratory disturbances were associated with oxygen desaturation down to a nadir of 85.0% during sleep. The mean oxygen saturation during the study was 93.7%. The cumulative time under 88% oxygen saturation was 5.5 minutes.  LEG MOVEMENT DATA The total leg movements were 0 with a resulting leg movement index of 0.0/hr .Associated arousal with leg movement index was 0.0/hr.  CARDIAC DATA The underlying cardiac rhythm was most consistent with sinus rhythm. Mean heart rate during sleep was 62.0 bpm. Additional rhythm abnormalities include PVCs.  IMPRESSIONS Severe Obstructive Sleep apnea(OSA) Electrocardiographic data showed presence of PVCs. Moderate Oxygen Desaturation The patient snored with moderate snoring volume. No significant periodic leg movements(PLMs) during sleep. However, no significant associated arousals.  DIAGNOSIS Obstructive Sleep Apnea (327.23 [G47.33 ICD-10]) Central Sleep Apnea (327.27 [G47.37 ICD-10]) Nocturnal Hypoxemia (327.26 [G47.36 ICD-10])  RECOMMENDATIONS CPAP titration to determine optimal pressure required to alleviate sleep disordered breathing. BiPAP or ASV titration may be required to eliminate central sleep apnea. Positional therapy avoiding supine position during sleep. Avoid alcohol, sedatives and other CNS depressants that may worsen sleep apnea and disrupt normal sleep architecture. Sleep hygiene should be reviewed to assess factors that may improve sleep quality. Weight management and regular exercise should be initiated or continued.  [Electronically signed] 04/09/2020 06:03 PM  Sherrilyn Rist MD NPI: [id number]

## 2020-04-10 NOTE — Telephone Encounter (Signed)
Called and spoke with Patient, and Patient's Wife [name]. Sleep results and recommendations from Dr.Olalere given.  Understanding stated.  Cpap titration ordered.  Nothing further at this time.

## 2020-04-17 ENCOUNTER — Other Ambulatory Visit: Payer: Self-pay | Admitting: Internal Medicine

## 2020-04-24 ENCOUNTER — Other Ambulatory Visit (HOSPITAL_COMMUNITY)
Admission: RE | Admit: 2020-04-24 | Discharge: 2020-04-24 | Disposition: A | Discharge status: Home / Self Care | Payer: BC Managed Care – PPO | Source: Ambulatory Visit | Attending: Pulmonary Disease | Admitting: Pulmonary Disease

## 2020-04-24 DIAGNOSIS — Z01812 Encounter for preprocedural laboratory examination: Secondary | ICD-10-CM | POA: Insufficient documentation

## 2020-04-24 DIAGNOSIS — Z20822 Contact with and (suspected) exposure to covid-19: Secondary | ICD-10-CM | POA: Insufficient documentation

## 2020-04-24 LAB — SARS CORONAVIRUS 2 (TAT 6-24 HRS): SARS Coronavirus 2: NEGATIVE

## 2020-04-26 ENCOUNTER — Other Ambulatory Visit: Payer: Self-pay

## 2020-04-26 ENCOUNTER — Ambulatory Visit (HOSPITAL_BASED_OUTPATIENT_CLINIC_OR_DEPARTMENT_OTHER): Payer: BC Managed Care – PPO | Attending: Pulmonary Disease | Admitting: Pulmonary Disease

## 2020-04-26 DIAGNOSIS — G4761 Periodic limb movement disorder: Secondary | ICD-10-CM | POA: Insufficient documentation

## 2020-04-26 DIAGNOSIS — G4733 Obstructive sleep apnea (adult) (pediatric): Secondary | ICD-10-CM | POA: Insufficient documentation

## 2020-05-02 ENCOUNTER — Telehealth: Payer: Self-pay | Admitting: Pulmonary Disease

## 2020-05-02 DIAGNOSIS — G4733 Obstructive sleep apnea (adult) (pediatric): Secondary | ICD-10-CM | POA: Diagnosis not present

## 2020-05-02 NOTE — Telephone Encounter (Signed)
Call patient  Sleep study result  Date of study: 04/26/2020  Impression: Moderate obstructive sleep apnea  Recommendation: - Trial of CPAP therapy on 11 cm H2O with a Large size Fisher&Paykel Full Face Mask Simplus mask and heated humidification. -Continue weight management efforts -Follow-up in the sleep lab 4 to 6 weeks following initiation of treatment

## 2020-05-02 NOTE — Procedures (Signed)
POLYSOMNOGRAPHY  Last, First: [name], [name] MRN: [id number] Gender: Male Age (years): 64 Weight (lbs): 275 DOB: May 13, 1956 BMI: 41 Primary Care: No PCP Epworth Score: 7 Referring: Laurin Coder MD Technician: Zadie Rhine Interpreting: Laurin Coder MD Study Type: CPAP Ordered Study Type: CPAP Study date: 04/26/2020 Location: [facility] CLINICAL INFORMATION [name] is a 64 year old Male and was referred to the sleep center for evaluation of G47.30 Sleep Apnea, Unspecified (780.57). Indications include OSA.   Most recent polysomnogram dated 04/02/2020 revealed an AHI of 59/h. MEDICATIONS Patient self administered medications include: N/A. Medications administered during study include No sleep medicine administered.  SLEEP STUDY TECHNIQUE The patient underwent an attended overnight polysomnography titration to assess the effects of CPAP therapy. The following variables were monitored: EEG(C4-A1, C3-A2, O1-A2, O2-A1), EOG, submental and leg EMG, ECG, oxyhemoglobin saturation by pulse oximetry, thoracic and abdominal respiratory effort belts, nasal/oral airflow by pressure sensor, body position sensor and snoring sensor. CPAP pressure was titrated to eliminate apneas, hypopneas and oxygen desaturation. Hypopneas were scored per AASM definition IB (4% desaturation)  TECHNICIAN COMMENTS Comments added by Technician: NO RESTROOM VISTED Comments added by Scorer: N/A SLEEP ARCHITECTURE The study was initiated at 10:17:55 PM and terminated at 4:25:24 AM. Total recorded time was 367.5 minutes. EEG confirmed total sleep time was 290 minutes yielding a sleep efficiency of 78.9%%. Sleep onset after lights out was 10.5 minutes with a REM latency of 78.5 minutes. The patient spent 5.3%% of the night in stage N1 sleep, 83.8%% in stage N2 sleep, 0.7%% in stage N3 and 10.2% in REM. The Arousal Index was 27.9/hour. RESPIRATORY PARAMETERS The overall AHI was 1.7 per hour, and the RDI was 2.1  events/hour with a central apnea index of 0.2per hour. The most appropriate setting of CPAP was 11 cm H2O. At this setting, the sleep efficiency was 100% and the patient was supine for 100%. The AHI was 0.0 events per hour, and the RDI was 0.0 events/hour (with 0.2 central events) and the arousal index was 18.2 per hour.The oxygen nadir was 93.0% during sleep.    The cumulative time under 88% oxygen saturation was 5.5 minutes  LEG MOVEMENT DATA The total leg movements were 314 with a resulting leg movement index of 65.0/hr. Associated arousal with leg movement index was 5.6/hr. CARDIAC DATA The underlying cardiac rhythm was most consistent with sinus rhythm. Mean heart rate during sleep was 63.2 bpm. Additional rhythm abnormalities include PVCs.   IMPRESSIONS - Electrocardiographic data showed presence of PVCs. - No snoring was audible during this study. - No significant Oxygen Desaturation - No Significant Obstructive Sleep apnea(OSA) Optimal pressure attained. - No Significant Central Sleep Apnea (CSA) - No Significant Upper Airway Resistance Syndrome(UARS). - Mild periodic leg movements(PLMs) during sleep. Associated arousals were significant with an arousal index of 5.6 /hour. - Reduced sleep efficiency, normal primary sleep latency, short REM sleep latency and normal slow wave latency.   DIAGNOSIS - Obstructive Sleep Apnea (327.23 [G47.33 ICD-10]) - Periodic Limb Movement During Sleep (327.51 [G47.61 ICD-10])   RECOMMENDATIONS - Trial of CPAP therapy on 11 cm H2O with a Large size Fisher&Paykel Full Face Mask Simplus mask and heated humidification. - Avoid alcohol, sedatives and other CNS depressants that may worsen sleep apnea and disrupt normal sleep architecture. - Sleep hygiene should be reviewed to assess factors that may improve sleep quality. - Weight management and regular exercise should be initiated or continued. - Return to Sleep Center for re-evaluation after 4 weeks  of therapy  [Electronically signed] 05/02/2020 06:12 AM  Sherrilyn Rist MD NPI: [id number]

## 2020-05-02 NOTE — Addendum Note (Signed)
Addended by: Stephanie Coup on: 05/02/2020 11:20 AM   Modules accepted: Orders

## 2020-05-02 NOTE — Telephone Encounter (Signed)
Patient notified results of sleep study. Pt ok to proceed with cpap therapy. Orders have been placed for cpap. Pt wishes to call back after receiving cpap to schedule f/u. Nothing further needed.

## 2020-05-11 ENCOUNTER — Other Ambulatory Visit: Payer: Self-pay | Admitting: Internal Medicine

## 2020-05-16 ENCOUNTER — Other Ambulatory Visit: Payer: Self-pay | Admitting: Internal Medicine

## 2020-05-21 ENCOUNTER — Emergency Department (HOSPITAL_COMMUNITY)
Admission: EM | Admit: 2020-05-21 | Discharge: 2020-05-21 | Disposition: A | Discharge status: Home / Self Care | Payer: BC Managed Care – PPO | Attending: Emergency Medicine | Admitting: Emergency Medicine

## 2020-05-21 ENCOUNTER — Encounter (HOSPITAL_COMMUNITY): Payer: Self-pay | Admitting: Emergency Medicine

## 2020-05-21 ENCOUNTER — Emergency Department (HOSPITAL_COMMUNITY): Payer: BC Managed Care – PPO

## 2020-05-21 ENCOUNTER — Other Ambulatory Visit: Payer: Self-pay

## 2020-05-21 DIAGNOSIS — I480 Paroxysmal atrial fibrillation: Secondary | ICD-10-CM | POA: Diagnosis not present

## 2020-05-21 DIAGNOSIS — Z87891 Personal history of nicotine dependence: Secondary | ICD-10-CM | POA: Insufficient documentation

## 2020-05-21 DIAGNOSIS — I739 Peripheral vascular disease, unspecified: Secondary | ICD-10-CM | POA: Insufficient documentation

## 2020-05-21 DIAGNOSIS — Z950 Presence of cardiac pacemaker: Secondary | ICD-10-CM | POA: Diagnosis not present

## 2020-05-21 DIAGNOSIS — Z951 Presence of aortocoronary bypass graft: Secondary | ICD-10-CM | POA: Insufficient documentation

## 2020-05-21 DIAGNOSIS — Z79899 Other long term (current) drug therapy: Secondary | ICD-10-CM | POA: Insufficient documentation

## 2020-05-21 DIAGNOSIS — I2510 Atherosclerotic heart disease of native coronary artery without angina pectoris: Secondary | ICD-10-CM | POA: Diagnosis not present

## 2020-05-21 DIAGNOSIS — R0789 Other chest pain: Secondary | ICD-10-CM | POA: Diagnosis not present

## 2020-05-21 DIAGNOSIS — R079 Chest pain, unspecified: Secondary | ICD-10-CM

## 2020-05-21 DIAGNOSIS — J449 Chronic obstructive pulmonary disease, unspecified: Secondary | ICD-10-CM | POA: Insufficient documentation

## 2020-05-21 DIAGNOSIS — I10 Essential (primary) hypertension: Secondary | ICD-10-CM | POA: Insufficient documentation

## 2020-05-21 DIAGNOSIS — Z7984 Long term (current) use of oral hypoglycemic drugs: Secondary | ICD-10-CM | POA: Diagnosis not present

## 2020-05-21 DIAGNOSIS — E119 Type 2 diabetes mellitus without complications: Secondary | ICD-10-CM | POA: Diagnosis not present

## 2020-05-21 DIAGNOSIS — Z7982 Long term (current) use of aspirin: Secondary | ICD-10-CM | POA: Insufficient documentation

## 2020-05-21 LAB — CBC
HCT: 40.6 % (ref 39.0–52.0)
Hemoglobin: 13 g/dL (ref 13.0–17.0)
MCH: 24.6 pg — ABNORMAL LOW (ref 26.0–34.0)
MCHC: 32 g/dL (ref 30.0–36.0)
MCV: 76.9 fL — ABNORMAL LOW (ref 80.0–100.0)
Platelets: 301 10*3/uL (ref 150–400)
RBC: 5.28 MIL/uL (ref 4.22–5.81)
RDW: 15.8 % — ABNORMAL HIGH (ref 11.5–15.5)
WBC: 5.8 10*3/uL (ref 4.0–10.5)
nRBC: 0 % (ref 0.0–0.2)

## 2020-05-21 LAB — BASIC METABOLIC PANEL
Anion gap: 10 (ref 5–15)
BUN: 9 mg/dL (ref 8–23)
CO2: 25 mmol/L (ref 22–32)
Calcium: 8.8 mg/dL — ABNORMAL LOW (ref 8.9–10.3)
Chloride: 103 mmol/L (ref 98–111)
Creatinine, Ser: 0.91 mg/dL (ref 0.61–1.24)
GFR calc Af Amer: >60 mL/min (ref >60)
GFR calc non Af Amer: >60 mL/min (ref >60)
Glucose, Bld: 197 mg/dL — ABNORMAL HIGH (ref 70–99)
Potassium: 4.5 mmol/L (ref 3.5–5.1)
Sodium: 138 mmol/L (ref 135–145)

## 2020-05-21 LAB — TROPONIN I (HIGH SENSITIVITY)
Troponin I (High Sensitivity): 9 ng/L (ref <18)
Troponin I (High Sensitivity): 10 ng/L (ref <18)

## 2020-05-21 MED ORDER — ASPIRIN 81 MG PO CHEW
324.0000 mg | CHEWABLE_TABLET | Freq: Once | ORAL | Status: AC
  Administered 2020-05-21: 324 mg via ORAL
  Filled 2020-05-21: qty 4

## 2020-05-21 MED ORDER — MORPHINE SULFATE (PF) 4 MG/ML IV SOLN
4.0000 mg | Freq: Once | INTRAVENOUS | Status: AC
  Administered 2020-05-21: 4 mg via INTRAVENOUS
  Filled 2020-05-21: qty 1

## 2020-05-21 MED ORDER — SODIUM CHLORIDE 0.9% FLUSH
3.0000 mL | Freq: Once | INTRAVENOUS | Status: AC
  Administered 2020-05-21: 3 mL via INTRAVENOUS

## 2020-05-21 NOTE — ED Provider Notes (Signed)
[facility] EMERGENCY DEPARTMENT Provider Note   CSN: [id number] Arrival date & time: 05/21/20  1445     History Chief Complaint  Patient presents with  . Chest Pain    [name] is a 64 y.o. male.  HPI  HPI: A 64 year old patient with a history of peripheral artery disease, treated diabetes, hypertension and hypercholesterolemia presents for evaluation of chest pain. Initial onset of pain was more than 6 hours ago. The patient's chest pain is sharp and is not worse with exertion. The patient's chest pain is middle- or left-sided, is not well-localized, is not described as heaviness/pressure/tightness and does not radiate to the arms/jaw/neck. The patient does not complain of nausea and denies diaphoresis. The patient has no history of stroke, has not smoked in the past 90 days, has no relevant family history of coronary artery disease (first degree relative at less than age 52) and does not have an elevated BMI (>=30).   Pt started with some pain last week but it went away.  Today the pain has been constant since around 9am.  It is sharp.  Not coughing a lot.  Vaccinated for covid.  Past Medical History:  Diagnosis Date  . Arthritis   . Automatic implantable cardioverter-defibrillator in situ   . COPD (chronic obstructive pulmonary disease) ([place])    emphysema by CXR  . Coronary artery disease   . Diabetes mellitus    type 2  no meds  . Diverticulosis of colon   . GERD (gastroesophageal reflux disease)    with esophagitis  . History of colonic polyps   . Hypertension   . Non-ischemic cardiomyopathy ([place])   . Presence of permanent cardiac pacemaker   . PVD (peripheral vascular disease) ([place])    bilateral common iliac artery aneurysms. Right SFA occlusion over a long segment. Left SFA disease with occlusion of the left TP trunk. Artirogram Oct. 2006  . Sleep apnea   . Tobacco abuse   . Urinary incontinence    detrussor instability    Patient Active  Problem List   Diagnosis Date Noted  . PAF (paroxysmal atrial fibrillation) ([place]) 03/01/2020  . Persistent atrial fibrillation ([facility]) 02/16/2020  . S/P CABG x 4 02/11/2020  . Coronary artery disease involving native coronary artery of native heart with unstable angina pectoris ([place]) 02/08/2020  . Osteoarthritis of left shoulder 12/10/2019  . Knee pain, left 06/29/2019  . Cervical sprain 01/28/2019  . Subacromial impingement of left shoulder 01/19/2019  . Type 2 diabetes mellitus with diabetic neuropathy ([place]) 01/19/2019  . PVD (peripheral vascular disease) ([facility]) 10/13/2018  . PVC's (premature ventricular contractions) 10/13/2018  . Iliac artery occlusion ([facility]) 03/19/2018  . Aneurysm of iliac artery ([place]) 03/06/2018  . Status post coronary artery stent placement   . AAA (abdominal aortic aneurysm) without rupture ([place])   . Bilateral carotid artery stenosis 04/28/2017  . Chronic pain 04/14/2017  . Cerumen impaction 11/25/2016  . Benign essential HTN 09/17/2016  . CAD (coronary artery disease), native coronary artery 09/17/2016  . Mood disorder ([place]) 11/29/2015  . Left leg swelling 05/15/2015  . COPD exacerbation ([place]) 12/15/2012  . Routine general medical examination at a health care facility 10/26/2012  . Atherosclerosis of native coronary artery with angina pectoris ([facility]) 10/17/2011  . OSA (obstructive sleep apnea) 08/28/2011  . Obesity 08/27/2011  . Chronic systolic heart failure ([place]) 08/06/2011  . B12 DEFICIENCY 04/14/2009  . CAROTID BRUIT 02/16/2009  . Diabetic polyneuropathy ([place]) 12/07/2008  . URINARY  INCONTINENCE 05/27/2008  . COPD (chronic obstructive pulmonary disease) with emphysema ([place]) 02/09/2008  . Hyperlipemia 10/13/2007  . DIVERTICULOSIS, COLON 09/15/2007  . COLONIC POLYPS, HX OF 09/15/2007  . Type 2 diabetes, controlled, with peripheral circulatory disorder ([place]) 05/25/2007  . GERD 05/25/2007  . REFLUX ESOPHAGITIS 04/23/2007    Past Surgical History:    Procedure Laterality Date  . ABDOMINAL AORTIC ENDOVASCULAR STENT GRAFT N/A 05/07/2018   Procedure: ABDOMINAL AORTIC ENDOVASCULAR STENT GRAFT;  Surgeon: Conrad Chatham, MD;  Location: [facility];  Service: Vascular;  Laterality: N/A;  . CARDIAC CATHETERIZATION  06/2004   negative  . CARDIAC CATHETERIZATION  02/08/2020  . CLIPPING OF ATRIAL APPENDAGE N/A 02/11/2020   Procedure: Clipping Of Atrial Appendage using AtriCure 35 MM AtriClip.;  Surgeon: Wonda Olds, MD;  Location: [facility];  Service: Open Heart Surgery;  Laterality: N/A;  . COLONOSCOPY  04/2005  . COPD exacerbation    . CORONARY ANGIOPLASTY  01/09/2018  . CORONARY ARTERY BYPASS GRAFT N/A 02/11/2020   Procedure: CORONARY ARTERY BYPASS GRAFTING (CABG) using LIMA to LAD; RIMA to PL; Endoscopic right greater saphenous vein harvest: SVC to Diag; SVC to OM1.;  Surgeon: Wonda Olds, MD;  Location: [facility];  Service: Open Heart Surgery;  Laterality: N/A;  BILATERAL IMA  . CORONARY STENT INTERVENTION N/A 01/10/2018   Procedure: CORONARY STENT INTERVENTION;  Surgeon: Lorretta Harp, MD;  Location: [facility];  Service: Cardiovascular;  Laterality: N/A;  . CORONARY/GRAFT ACUTE MI REVASCULARIZATION N/A 01/10/2018   Procedure: Coronary/Graft Acute MI Revascularization;  Surgeon: Lorretta Harp, MD;  Location: [facility];  Service: Cardiovascular;  Laterality: N/A;  . EMBOLIZATION Left 03/19/2018  . EMBOLIZATION Left 03/19/2018   Procedure: EMBOLIZATION - Left Internal;  Surgeon: Conrad Mack, MD;  Location: [facility];  Service: Cardiovascular;  Laterality: Left;  . EMBOLIZATION Right 04/16/2018   Procedure: EMBOLIZATION;  Surgeon: Conrad August, MD;  Location: [facility];  Service: Cardiovascular;  Laterality: Right;  . ENDOVEIN HARVEST OF GREATER SAPHENOUS VEIN Right 02/11/2020   Procedure: [place] Of Greater Saphenous Vein using right lower extremity.;  Surgeon: Wonda Olds, MD;  Location: [facility];   Service: Open Heart Surgery;  Laterality: Right;  . FEMORAL ARTERY EXPLORATION Right 05/07/2018   Procedure: FEMORAL ARTERY EXPLORATION, EXTENDED PROFUNDAPLASTY;  Surgeon: Conrad Bledsoe, MD;  Location: [facility];  Service: Vascular;  Laterality: Right;  . INTRAOPERATIVE ARTERIOGRAM Right 05/07/2018   Procedure: INTRA OPERATIVE ARTERIOGRAM;  Surgeon: Conrad [place], MD;  Location: [facility];  Service: Vascular;  Laterality: Right;  . LEFT HEART CATH AND CORONARY ANGIOGRAPHY N/A 01/10/2018   Procedure: LEFT HEART CATH AND CORONARY ANGIOGRAPHY;  Surgeon: Lorretta Harp, MD;  Location: [facility];  Service: Cardiovascular;  Laterality: N/A;  . LEFT HEART CATH AND CORONARY ANGIOGRAPHY N/A 02/08/2020   Procedure: LEFT HEART CATH AND CORONARY ANGIOGRAPHY;  Surgeon: Troy Sine, MD;  Location: [facility];  Service: Cardiovascular;  Laterality: N/A;  . PACEMAKER INSERTION  11/2005  . PACEMAKER LEAD REMOVAL N/A 10/17/2014   Procedure: PACEMAKER LEAD REMOVAL;  Surgeon: Evans Lance, MD;  Location: [facility];  Service: Cardiovascular;  Laterality: N/A;  . RENAL ANGIOGRAPHY Left 04/16/2018   Procedure: RENAL ANGIOGRAPHY;  Surgeon: Conrad [place], MD;  Location: [facility];  Service: Cardiovascular;  Laterality: Left;  . s/p ICD placement      Medtronic Maximo [phone] single  chamber  . TEE WITHOUT CARDIOVERSION N/A 02/11/2020   Procedure: TRANSESOPHAGEAL ECHOCARDIOGRAM (TEE);  Surgeon: Wonda Olds, MD;  Location: [facility];  Service: Open Heart Surgery;  Laterality: N/A;       Family History  Problem Relation Age of Onset  . Stroke Father   . Coronary artery disease Maternal Aunt   . Heart failure Maternal Aunt   . Lung cancer Maternal Aunt   . Lung cancer Maternal Uncle   . Cancer Brother        Mouth  . Cancer Sister        throat    Social History   Tobacco Use  . Smoking status: Former Smoker    Packs/day: 2.00    Years: 15.00    Pack years: 30.00    Types: Cigarettes     Quit date: 01/09/2018    Years since quitting: 2.3  . Smokeless tobacco: Never Used  . Tobacco comment: GAVE 1-800-QUIT-NOW  Substance Use Topics  . Alcohol use: No  . Drug use: No    Home Medications Prior to Admission medications   Medication Sig Start Date End Date Taking? Authorizing Provider  acetaminophen (TYLENOL) 500 MG tablet Take 1,000 mg by mouth every 6 (six) hours as needed (for pain.).    [provider]  albuterol (VENTOLIN HFA) 108 (90 Base) MCG/ACT inhaler TAKE 2 PUFFS BY MOUTH EVERY 6 HOURS AS NEEDED FOR WHEEZE OR SHORTNESS OF BREATH *USE W/ SPACER* 05/16/20   Venia Carbon, MD  amiodarone (PACERONE) 200 MG tablet Take 1 tablet (200 mg total) by mouth daily. For 7 days then take 400 mg daily 03/02/20   Minus Breeding, MD  aspirin 81 MG chewable tablet Chew 1 tablet (81 mg total) by mouth daily. 02/18/20   Gold, Wilder Glade, PA-C  atorvastatin (LIPITOR) 80 MG tablet Take 1 tablet (80 mg total) by mouth daily at 6 PM. 02/18/20   Gold, Wilder Glade, PA-C  clopidogrel (PLAVIX) 75 MG tablet Take 1 tablet (75 mg total) by mouth daily. 02/18/20   Eaton Giovanni, PA-C  colchicine 0.6 MG tablet Take 1 tablet (0.6 mg total) by mouth daily. 02/18/20   Gold, Wayne E, PA-C  glipiZIDE (GLUCOTROL) 5 MG tablet TAKE 1 TABLET (5 MG TOTAL) BY MOUTH 2 (TWO) TIMES DAILY BEFORE A MEAL. 05/11/20   Venia Carbon, MD  magnesium oxide (MAG-OX) 400 (241.3 Mg) MG tablet Take 2 tablets (800 mg total) by mouth 2 (two) times daily. 02/18/20   Gold, Wilder Glade, PA-C  metFORMIN (GLUCOPHAGE-XR) 500 MG 24 hr tablet TAKE 2 TABLETS BY MOUTH EVERY DAY WITH BREAKFAST Patient taking differently: Take 1,000 mg by mouth daily with breakfast.  01/18/20   Venia Carbon, MD  metoprolol tartrate (LOPRESSOR) 25 MG tablet Take 1 tablet (25 mg total) by mouth 2 (two) times daily. 03/27/20   Minus Breeding, MD  Multiple Vitamin (MULTIVITAMIN WITH MINERALS) TABS tablet Take 1 tablet by mouth daily.    [provider]   pantoprazole (PROTONIX) 40 MG tablet Take 1 tablet (40 mg total) by mouth at bedtime. 02/18/20   Gold, Wayne E, PA-C  SPIRIVA HANDIHALER 18 MCG inhalation capsule INHALE 1 CAPSULE VIA HANDIHALER ONCE DAILY AT THE SAME TIME EVERY DAY 03/11/20   Venia Carbon, MD  vitamin B-12 (CYANOCOBALAMIN) 1000 MCG tablet Take 1,000 mcg by mouth daily.     [provider]  zolpidem (AMBIEN) 5 MG tablet Take 1 tablet (5 mg total) by  mouth at bedtime as needed for sleep. 02/21/20   Wonda Olds, MD    Allergies    Patient has no known allergies.  Review of Systems   Review of Systems  All other systems reviewed and are negative.   Physical Exam Updated Vital Signs BP (!) 158/92 (BP Location: Right Arm)   Pulse 64   Temp 97.9 F (36.6 C) (Oral)   Resp 14   SpO2 98%   Physical Exam Vitals and nursing note reviewed.  Constitutional:      General: He is not in acute distress.    Appearance: He is well-developed.  HENT:     Head: Normocephalic and atraumatic.     Right Ear: External ear normal.     Left Ear: External ear normal.  Eyes:     General: No scleral icterus.       Right eye: No discharge.        Left eye: No discharge.     Conjunctiva/sclera: Conjunctivae normal.  Neck:     Trachea: No tracheal deviation.  Cardiovascular:     Rate and Rhythm: Normal rate and regular rhythm.  Pulmonary:     Effort: Pulmonary effort is normal. No respiratory distress.     Breath sounds: Normal breath sounds. No stridor. No wheezing or rales.  Chest:     Chest wall: Tenderness present.     Comments: Left chest wall Abdominal:     General: Bowel sounds are normal. There is no distension.     Palpations: Abdomen is soft.     Tenderness: There is no abdominal tenderness. There is no guarding or rebound.  Musculoskeletal:        General: No tenderness.     Cervical back: Neck supple.  Skin:    General: Skin is warm and dry.     Findings: No rash.  Neurological:     Mental  Status: He is alert.     Cranial Nerves: No cranial nerve deficit (no facial droop, extraocular movements intact, no slurred speech).     Sensory: No sensory deficit.     Motor: No abnormal muscle tone or seizure activity.     Coordination: Coordination normal.     ED Results / Procedures / Treatments   Labs (all labs ordered are listed, but only abnormal results are displayed) Labs Reviewed  BASIC METABOLIC PANEL - Abnormal; Notable for the following components:      Result Value   Glucose, Bld 197 (*)    Calcium 8.8 (*)    All other components within normal limits  CBC - Abnormal; Notable for the following components:   MCV 76.9 (*)    MCH 24.6 (*)    RDW 15.8 (*)    All other components within normal limits  TROPONIN I (HIGH SENSITIVITY)  TROPONIN I (HIGH SENSITIVITY)    EKG EKG Interpretation  Date/Time:  Sunday May 21 2020 14:51:08 EDT Ventricular Rate:  65 PR Interval:  230 QRS Duration: 106 QT Interval:  458 QTC Calculation: 476 R Axis:   66 Text Interpretation: Sinus rhythm with 1st degree A-V block Cannot rule out Inferior infarct , age undetermined Abnormal ECG No significant change since last tracing Confirmed by Dorie Rank [phone]) on 05/21/2020 4:39:39 PM   Radiology DG Chest 2 View  Result Date: 05/21/2020 CLINICAL DATA:  LEFT-sided chest pain EXAM: CHEST - 2 VIEW FINDINGS: Sternotomy wires overlie normal cardiac silhouette. There is chronic bronchitic markings. No effusion, infiltrate pneumothorax. Atelectasis at  the RIGHT lung base noted. IMPRESSION: Chronic bronchitic markings and RIGHT basilar atelectasis. No clear acute findings. Electronically Signed   By: Suzy Bouchard M.D.   On: 05/21/2020 15:20    Procedures Procedures (including critical care time)  Medications Ordered in ED Medications  sodium chloride flush (NS) 0.9 % injection 3 mL (3 mLs Intravenous Given 05/21/20 1732)  morphine 4 MG/ML injection 4 mg (4 mg Intravenous Given 05/21/20  1719)  aspirin chewable tablet 324 mg (324 mg Oral Given 05/21/20 1717)    ED Course  I have reviewed the triage vital signs and the nursing notes.  Pertinent labs & imaging results that were available during my care of the patient were reviewed by me and considered in my medical decision making (see chart for details).  Clinical Course as of May 21 1822  Sun May 21, 2020  1751 CBC is normal.  Metabolic panel is normal.  Serial troponins are normal.  Chest x-ray is normal   [JK]    Clinical Course User Index [JK] Dorie Rank, MD   MDM Rules/Calculators/A&P HEAR Score: 4                    Patient presented to the ED with complaints of chest pain. He has known history of coronary artery disease. Patient had a coronary bypass graft in February of this year. Patient denied any respiratory symptoms. Symptoms not suggestive of aortic dissection or pulmonary embolism. Patient was evaluated for possible cardiac etiology of his chest pain. ED work-up is reassuring. Chest x-ray does not show any pneumonia. Labs are unremarkable including serial troponins. He had a heart score of 4. Serial troponins were negative. Heart pathway algorithms followed. I think the patient is stable for close outpatient follow-up. He has had constant pain throughout the day and has had 2 - troponins. I doubt that his symptoms are related to acute coronary syndrome.  At this time there does not appear to be any evidence of an acute emergency medical condition and the patient appears stable for discharge with appropriate outpatient follow up.  Final Clinical Impression(s) / ED Diagnoses Final diagnoses:  Chest pain, unspecified type    Rx / DC Orders ED Discharge Orders    None       Dorie Rank, MD 05/21/20 1825

## 2020-05-21 NOTE — ED Triage Notes (Signed)
C/o L sided chest pain x 2 days with SOB.  Denies nausea and vomiting.

## 2020-05-21 NOTE — Discharge Instructions (Addendum)
Take over-the-counter medications as needed. Follow-up with your cardiologist next week. Return to the ER for worsening symptoms.

## 2020-05-21 NOTE — ED Notes (Signed)
Pt discharge instructions and follow-up care reviewed with the patient. The patient verbalized understanding of instructions. Pt discharged. 

## 2020-05-24 ENCOUNTER — Telehealth: Payer: Self-pay

## 2020-05-24 NOTE — Telephone Encounter (Signed)
Spoke to pt. He said he is feeling better since ER visit for chest pain. Thinks it was musculoskeletal.

## 2020-05-28 ENCOUNTER — Other Ambulatory Visit: Payer: Self-pay | Admitting: Surgical

## 2020-05-31 DIAGNOSIS — I4819 Other persistent atrial fibrillation: Secondary | ICD-10-CM | POA: Diagnosis not present

## 2020-05-31 DIAGNOSIS — G4733 Obstructive sleep apnea (adult) (pediatric): Secondary | ICD-10-CM | POA: Diagnosis not present

## 2020-06-01 DIAGNOSIS — I4819 Other persistent atrial fibrillation: Secondary | ICD-10-CM | POA: Diagnosis not present

## 2020-06-01 DIAGNOSIS — G4733 Obstructive sleep apnea (adult) (pediatric): Secondary | ICD-10-CM | POA: Diagnosis not present

## 2020-06-03 ENCOUNTER — Other Ambulatory Visit (HOSPITAL_COMMUNITY): Payer: BC Managed Care – PPO

## 2020-06-04 NOTE — Progress Notes (Signed)
Cardiology Office Note   Date:  06/05/2020   ID:  [name], DOB 1956/05/01, MRN [id number]  PCP:  Venia Carbon, MD  Cardiologist:   Minus Breeding, MD  Chief Complaint  Patient presents with  . Shortness of Breath      History of Present Illness: [name] is a 64 y.o. male who presents who presents post CABG.  He had a history of CAD with cath in 2012demonstrating LAD a 60-70% stenosis, moderate size second diagonal with 50% stenosis, circumflex AV groove 75-80% stenosis with a branching obtuse marginal with a superior branch subtotal stenosis followed by diffuse disease, right coronary artery had nonobstructive disease. EF was 35%. He had a ICD placed, and at ERI, Dr.Taylor elected not to replace it. In Jan 2019he had an inferior MI . He underwent urgent DES to to the mid RCA and occluded left circumflex. EF on echo was 40 - 45%. He had some residual disease in the LAD and the plan was for out patient stress testing with PCIIf abnormal..He had a AAA repair 04/2018.  On 12/21/2019 the patient was seen in the ED with cough and was found to be positive for COVID-19.  He presented in Feb with chest pain.   He was sent for cath and had 3 vessel CAD and underwent CABG  X 4 by Dr. Orvan Seen.   Since I last saw him in March he was in the ED with chest pain on May 30th.  I reviewed these records for this visit.   He had no objective evidence of ischemia.  EKG had no acute changes and trop was negative.  There was no clear cardiac etiology.  He said it was a sharp discomfort.  Is not happening anymore.  He does have a sporadic cough.  Has had more shortness of breath.  He is gained about 10 pounds since I last saw him.  He does not report any new PND or swelling.  He is using his bronchodilators more.  He just started using CPAP again recently.  He is not having any substernal discomfort, neck or arm discomfort.  He is not having any new palpitations, presyncope or syncope.  He has  had no fevers or chills.   Past Medical History:  Diagnosis Date  . Arthritis   . Automatic implantable cardioverter-defibrillator in situ   . COPD (chronic obstructive pulmonary disease) ([place])    emphysema by CXR  . Coronary artery disease   . Diabetes mellitus    type 2  no meds  . Diverticulosis of colon   . GERD (gastroesophageal reflux disease)    with esophagitis  . History of colonic polyps   . Hypertension   . Non-ischemic cardiomyopathy ([place])   . Presence of permanent cardiac pacemaker   . PVD (peripheral vascular disease) ([place])    bilateral common iliac artery aneurysms. Right SFA occlusion over a long segment. Left SFA disease with occlusion of the left TP trunk. Artirogram Oct. 2006  . Sleep apnea   . Tobacco abuse   . Urinary incontinence    detrussor instability    Past Surgical History:  Procedure Laterality Date  . ABDOMINAL AORTIC ENDOVASCULAR STENT GRAFT N/A 05/07/2018   Procedure: ABDOMINAL AORTIC ENDOVASCULAR STENT GRAFT;  Surgeon: Conrad [place], MD;  Location: [place];  Service: Vascular;  Laterality: N/A;  . CARDIAC CATHETERIZATION  06/2004   negative  . CARDIAC CATHETERIZATION  02/08/2020  . CLIPPING OF ATRIAL APPENDAGE N/A  02/11/2020   Procedure: Clipping Of Atrial Appendage using AtriCure 35 MM AtriClip.;  Surgeon: Wonda Olds, MD;  [facility];  Service: Open Heart Surgery;  Laterality: N/A;  . COLONOSCOPY  04/2005  . COPD exacerbation    . CORONARY ANGIOPLASTY  01/09/2018  . CORONARY ARTERY BYPASS GRAFT N/A 02/11/2020   Procedure: CORONARY ARTERY BYPASS GRAFTING (CABG) using LIMA to LAD; RIMA to PL; Endoscopic right greater saphenous vein harvest: SVC to Diag; SVC to OM1.;  Surgeon: Wonda Olds, MD;  Location: [place];  Service: Open Heart Surgery;  Laterality: N/A;  BILATERAL IMA  . CORONARY STENT INTERVENTION N/A 01/10/2018   Procedure: CORONARY STENT INTERVENTION;  Surgeon: Lorretta Harp, MD;  Location: [place] [facility];  Service:  Cardiovascular;  Laterality: N/A;  . CORONARY/GRAFT ACUTE MI REVASCULARIZATION N/A 01/10/2018   Procedure: Coronary/Graft Acute MI Revascularization;  Surgeon: Lorretta Harp, MD;  Location: [place] [facility];  Service: Cardiovascular;  Laterality: N/A;  . EMBOLIZATION Left 03/19/2018  . EMBOLIZATION Left 03/19/2018   Procedure: EMBOLIZATION - Left Internal;  Surgeon: Conrad [place]dale, MD;  Location: [place] [facility];  Service: Cardiovascular;  Laterality: Left;  . EMBOLIZATION Right 04/16/2018   Procedure: EMBOLIZATION;  Surgeon: Conrad Fleming, MD;  Location: [place] [facility];  Service: Cardiovascular;  Laterality: Right;  . ENDOVEIN HARVEST OF GREATER SAPHENOUS VEIN Right 02/11/2020   Procedure: [place] Of Greater Saphenous Vein using right lower extremity.;  Surgeon: Wonda Olds, MD;  [facility];  Service: Open Heart Surgery;  Laterality: Right;  . FEMORAL ARTERY EXPLORATION Right 05/07/2018   Procedure: FEMORAL ARTERY EXPLORATION, EXTENDED PROFUNDAPLASTY;  Surgeon: Conrad Benson, MD;  Location: [place];  Service: Vascular;  Laterality: Right;  . INTRAOPERATIVE ARTERIOGRAM Right 05/07/2018   Procedure: INTRA OPERATIVE ARTERIOGRAM;  Surgeon: Conrad [place], MD;  Location: [place];  Service: Vascular;  Laterality: Right;  . LEFT HEART CATH AND CORONARY ANGIOGRAPHY N/A 01/10/2018   Procedure: LEFT HEART CATH AND CORONARY ANGIOGRAPHY;  Surgeon: Lorretta Harp, MD;  Location: [place] [facility];  Service: Cardiovascular;  Laterality: N/A;  . LEFT HEART CATH AND CORONARY ANGIOGRAPHY N/A 02/08/2020   Procedure: LEFT HEART CATH AND CORONARY ANGIOGRAPHY;  Surgeon: Troy Sine, MD;  Location: [place] [facility];  Service: Cardiovascular;  Laterality: N/A;  . PACEMAKER INSERTION  11/2005  . PACEMAKER LEAD REMOVAL N/A 10/17/2014   Procedure: PACEMAKER LEAD REMOVAL;  Surgeon: Evans Lance, MD;  Location: [place];  Service: Cardiovascular;  Laterality: N/A;  . RENAL ANGIOGRAPHY Left  04/16/2018   Procedure: RENAL ANGIOGRAPHY;  Surgeon: Conrad [place], MD;  Location: [place] [facility];  Service: Cardiovascular;  Laterality: Left;  . s/p ICD placement      Medtronic Maximo #7232 single chamber  . TEE WITHOUT CARDIOVERSION N/A 02/11/2020   Procedure: TRANSESOPHAGEAL ECHOCARDIOGRAM (TEE);  Surgeon: Wonda Olds, MD;  Location: [place];  Service: Open Heart Surgery;  Laterality: N/A;     Current Outpatient Medications  Medication Sig Dispense Refill  . acetaminophen (TYLENOL) 500 MG tablet Take 1,000 mg by mouth every 6 (six) hours as needed (for pain.).    Marland Kitchen albuterol (VENTOLIN HFA) 108 (90 Base) MCG/ACT inhaler TAKE 2 PUFFS BY MOUTH EVERY 6 HOURS AS NEEDED FOR WHEEZE OR SHORTNESS OF BREATH *USE W/ SPACER* 8.5 g 0  . aspirin 81 MG chewable tablet Chew 1 tablet (81 mg total) by mouth daily.    Marland Kitchen  atorvastatin (LIPITOR) 80 MG tablet Take 1 tablet (80 mg total) by mouth daily at 6 PM. 30 tablet 1  . clopidogrel (PLAVIX) 75 MG tablet Take 1 tablet (75 mg total) by mouth daily. 30 tablet 1  . glipiZIDE (GLUCOTROL) 5 MG tablet TAKE 1 TABLET (5 MG TOTAL) BY MOUTH 2 (TWO) TIMES DAILY BEFORE A MEAL. 180 tablet 0  . metFORMIN (GLUCOPHAGE-XR) 500 MG 24 hr tablet TAKE 2 TABLETS BY MOUTH EVERY DAY WITH BREAKFAST (Patient taking differently: Take 1,000 mg by mouth daily with breakfast. ) 180 tablet 3  . metoprolol tartrate (LOPRESSOR) 25 MG tablet Take 1 tablet (25 mg total) by mouth 2 (two) times daily. 180 tablet 3  . Multiple Vitamin (MULTIVITAMIN WITH MINERALS) TABS tablet Take 1 tablet by mouth daily.    . pantoprazole (PROTONIX) 40 MG tablet Take 1 tablet (40 mg total) by mouth at bedtime. 30 tablet 1  . SPIRIVA HANDIHALER 18 MCG inhalation capsule INHALE 1 CAPSULE VIA HANDIHALER ONCE DAILY AT THE SAME TIME EVERY DAY 30 capsule 11  . vitamin B-12 (CYANOCOBALAMIN) 1000 MCG tablet Take 1,000 mcg by mouth daily.     Marland Kitchen zolpidem (AMBIEN) 5 MG tablet Take 1 tablet (5 mg total) by mouth at  bedtime as needed for sleep. 30 tablet 0  . furosemide (LASIX) 20 MG tablet Take 1 tablet (20 mg total) by mouth daily as needed for fluid or edema (SHORTNESS OF BREATH). 30 tablet 3   No current facility-administered medications for this visit.    Allergies:   Patient has no known allergies.    ROS:  Please see the history of present illness.   Otherwise, review of systems are positive for none.   All other systems are reviewed and negative.    PHYSICAL EXAM: VS:  BP 140/88   Pulse 64   Ht 5\' 10"  (1.778 m)   Wt 283 lb (128.4 kg)   SpO2 94%   BMI 40.61 kg/m  , BMI Body mass index is 40.61 kg/m. GENERAL:  Well appearing NECK:  No jugular venous distention, waveform within normal limits, carotid upstroke brisk and symmetric, no bruits, no thyromegaly LUNGS:  Clear to auscultation bilaterally CHEST:  Well healed sternotomy scar. HEART:  PMI not displaced or sustained,S1 and S2 within normal limits, no S3, no S4, no clicks, no rubs, no murmurs ABD:  Flat, positive bowel sounds normal in frequency in pitch, no bruits, no rebound, no guarding, no midline pulsatile mass, no hepatomegaly, no splenomegaly EXT:  2 plus pulses throughout, no edema, no cyanosis no clubbing    EKG:  EKG is not  ordered today. NA   Recent Labs: 12/10/2019: ALT 27 02/15/2020: Magnesium 1.7 05/21/2020: BUN 9; Creatinine, Ser 0.91; Hemoglobin 13.0; Platelets 301; Potassium 4.5; Sodium 138    Lipid Panel    Component Value Date/Time   CHOL 125 12/10/2019 1131   CHOL 156 08/25/2018 1154   TRIG 307.0 (H) 12/10/2019 1131   TRIG 575 (HH) 01/02/2007 0000   HDL 37.60 (L) 12/10/2019 1131   HDL 41 08/25/2018 1154   CHOLHDL 3 12/10/2019 1131   VLDL 61.4 (H) 12/10/2019 1131   LDLCALC 35 08/25/2018 1154   LDLDIRECT 26.0 12/10/2019 1131      Wt Readings from Last 3 Encounters:  06/05/20 283 lb (128.4 kg)  04/26/20 275 lb (124.7 kg)  04/02/20 275 lb (124.7 kg)      Other studies Reviewed: Additional  studies/ records that were reviewed today include: ED records  Review of the above records demonstrates:  Please see elsewhere in the note.     ASSESSMENT AND PLAN:  CAD/CABG:    The patient has no new sypmtoms.  No further cardiovascular testing is indicated.  We will continue with aggressive risk reduction and meds as listed.  ATRIAL FIB:   He was taken off the amiodarone and has had no symptomatic recurrence.  He had a Maze procedure.  No change in therapy.  This was a postoperative arrhythmia.   CARDIOMYOPATHY: He has had a mildly reduced ejection fraction.  I will follow this up with echocardiography in the future.  I do not know that he is overtly volume overloaded but he is having a little more shortness of breath and his weight is up.  I Georgina Peer give him Lasix 20 mg as needed.  He will take it for the couple of days I gave him instructions on as needed use.  HTN: The blood pressure slightly elevated today but he forgot to take his medicines for the last 2 days.  No change in therapy.  DYSLIPIDEMIA: He will continue with high-dose statin.  MORBID OBESITY: I gave him instructions for a 10 pound weight change.  This was at the last appointment.  Unfortunately he gained 10 pounds.  I was more specific this time.   ICD:   This has been explanted.  OSA:   He just started using CPAP.  No change in therapy.  COVID EDUCATION: He had Covid.  He has had his vaccines.     Current medicines are reviewed at length with the patient today.  The patient does not have concerns regarding medicines.  The following changes have been made:  As above  Labs/ tests ordered today include: None  No orders of the defined types were placed in this encounter.    Disposition:   FU with me in 6 months.     Signed, Minus Breeding, MD  06/05/2020 11:54 AM    [facility]

## 2020-06-05 ENCOUNTER — Ambulatory Visit (INDEPENDENT_AMBULATORY_CARE_PROVIDER_SITE_OTHER): Payer: BC Managed Care – PPO | Admitting: Cardiology

## 2020-06-05 ENCOUNTER — Other Ambulatory Visit: Payer: Self-pay

## 2020-06-05 ENCOUNTER — Encounter: Payer: Self-pay | Admitting: Cardiology

## 2020-06-05 VITALS — BP 140/88 | HR 64 | Ht 70.0 in | Wt 283.0 lb

## 2020-06-05 DIAGNOSIS — E785 Hyperlipidemia, unspecified: Secondary | ICD-10-CM | POA: Diagnosis not present

## 2020-06-05 DIAGNOSIS — I480 Paroxysmal atrial fibrillation: Secondary | ICD-10-CM | POA: Diagnosis not present

## 2020-06-05 DIAGNOSIS — I10 Essential (primary) hypertension: Secondary | ICD-10-CM

## 2020-06-05 DIAGNOSIS — I2510 Atherosclerotic heart disease of native coronary artery without angina pectoris: Secondary | ICD-10-CM | POA: Diagnosis not present

## 2020-06-05 DIAGNOSIS — I200 Unstable angina: Secondary | ICD-10-CM

## 2020-06-05 MED ORDER — FUROSEMIDE 20 MG PO TABS
20.0000 mg | ORAL_TABLET | Freq: Every day | ORAL | 3 refills | Status: DC | PRN
Start: 2020-06-05 — End: 2020-06-14

## 2020-06-05 NOTE — Patient Instructions (Addendum)
Medication Instructions:  START LASIX 20MG  AS NEEDED FOR SHORTNESS OF BREATH OR SWELLING (3LBS OVERNIGHT OR 5LBS IN A WEEK) *If you need a refill on your cardiac medications before your next appointment, please call your pharmacy*  Lab Work: NONE ORDERED THIS VISIT If you have labs (blood work) drawn today and your tests are completely normal, you will receive your results only by: Marland Kitchen MyChart Message (if you have MyChart) OR . A paper copy in the mail If you have any lab test that is abnormal or we need to change your treatment, we will call you to review the results.  Testing/Procedures: NONE ORDERED THIS VISIT  Follow-Up: At [facility], you and your health needs are our priority.  As part of our continuing mission to provide you with exceptional heart care, we have created designated Provider Care Teams.  These Care Teams include your primary Cardiologist (physician) and Advanced Practice Providers (APPs -  Physician Assistants and Nurse Practitioners) who all work together to provide you with the care you need, when you need it.  Your next appointment:   6 month(s) You will receive a reminder letter in the mail two months in advance. If you don't receive a letter, please call our office to schedule the follow-up appointment.  The format for your next appointment:   In Person  Provider:   Minus Breeding, MD

## 2020-06-12 ENCOUNTER — Other Ambulatory Visit: Payer: Self-pay | Admitting: Internal Medicine

## 2020-06-14 ENCOUNTER — Other Ambulatory Visit: Payer: Self-pay

## 2020-06-14 ENCOUNTER — Ambulatory Visit (INDEPENDENT_AMBULATORY_CARE_PROVIDER_SITE_OTHER): Payer: BC Managed Care – PPO | Admitting: Internal Medicine

## 2020-06-14 ENCOUNTER — Encounter: Payer: Self-pay | Admitting: Internal Medicine

## 2020-06-14 VITALS — BP 124/76 | HR 67 | Temp 97.2°F | Ht 70.0 in | Wt 283.0 lb

## 2020-06-14 DIAGNOSIS — E1140 Type 2 diabetes mellitus with diabetic neuropathy, unspecified: Secondary | ICD-10-CM

## 2020-06-14 DIAGNOSIS — I5022 Chronic systolic (congestive) heart failure: Secondary | ICD-10-CM | POA: Diagnosis not present

## 2020-06-14 DIAGNOSIS — J439 Emphysema, unspecified: Secondary | ICD-10-CM | POA: Diagnosis not present

## 2020-06-14 DIAGNOSIS — I200 Unstable angina: Secondary | ICD-10-CM

## 2020-06-14 DIAGNOSIS — E1151 Type 2 diabetes mellitus with diabetic peripheral angiopathy without gangrene: Secondary | ICD-10-CM | POA: Diagnosis not present

## 2020-06-14 LAB — POCT GLYCOSYLATED HEMOGLOBIN (HGB A1C): Hemoglobin A1C: 7.8 % — AB (ref 4.0–5.6)

## 2020-06-14 MED ORDER — FUROSEMIDE 40 MG PO TABS: 40.0000 mg | ORAL_TABLET | Freq: Every day | ORAL | 3 refills | Status: DC

## 2020-06-14 NOTE — Assessment & Plan Note (Signed)
Could be part of the increased DOE Using the albuterol multiple times a day Needs pulmonary reevaluation of this

## 2020-06-14 NOTE — Assessment & Plan Note (Signed)
Lab Results  Component Value Date   HGBA1C 7.8 (A) 06/14/2020   Some better Will continue glipizide and metformin

## 2020-06-14 NOTE — Assessment & Plan Note (Signed)
Has gained weight but not clearly fluid With the worsened DOE--will increase the furosemide to see if that helps On metoprolol, statin, ASA, plavix

## 2020-06-14 NOTE — Progress Notes (Signed)
Subjective:    Patient ID: [name], male    DOB: 09/02/1956, 64 y.o.   MRN: [id number]  HPI Here for follow up of diabetes and other chronic health conditions This visit occurred during the SARS-CoV-2 public health emergency.  Safety protocols were in place, including screening questions prior to the visit, additional usage of staff PPE, and extensive cleaning of exam room while observing appropriate contact time as indicated for disinfecting solutions.   Having more breathing problems  Trouble doing any activity Gets winded just walking a few feet on level ground Using albuterol multiple times daily---some help Taking the furosemide daily now--he does respond to this spiriva for several years--worse since the CABG  No edema Sleeps in bed---raises the [facility] at times. Frequent awakening--?PND Now using the CPAP again Some chest pain--went to hospital No MI on testing  Checking sugars daily for a while--now infrequent Thinks they were usually under 120 Some hand and foot numbness--occ pain in feet  Current Outpatient Medications on File Prior to Visit  Medication Sig Dispense Refill  . acetaminophen (TYLENOL) 500 MG tablet Take 1,000 mg by mouth every 6 (six) hours as needed (for pain.).    Marland Kitchen albuterol (VENTOLIN HFA) 108 (90 Base) MCG/ACT inhaler TAKE 2 PUFFS BY MOUTH EVERY 6 HOURS AS NEEDED FOR WHEEZE OR SHORTNESS OF BREATH *USE W/ SPACER* 8.5 g 0  . aspirin 81 MG chewable tablet Chew 1 tablet (81 mg total) by mouth daily.    Marland Kitchen atorvastatin (LIPITOR) 80 MG tablet Take 1 tablet (80 mg total) by mouth daily at 6 PM. 30 tablet 1  . clopidogrel (PLAVIX) 75 MG tablet Take 1 tablet (75 mg total) by mouth daily. 30 tablet 1  . furosemide (LASIX) 20 MG tablet Take 1 tablet (20 mg total) by mouth daily as needed for fluid or edema (SHORTNESS OF BREATH). 30 tablet 3  . glipiZIDE (GLUCOTROL) 5 MG tablet TAKE 1 TABLET (5 MG TOTAL) BY MOUTH 2 (TWO) TIMES DAILY BEFORE A MEAL. 180 tablet 0    . metFORMIN (GLUCOPHAGE-XR) 500 MG 24 hr tablet TAKE 2 TABLETS BY MOUTH EVERY DAY WITH BREAKFAST (Patient taking differently: Take 1,000 mg by mouth daily with breakfast. ) 180 tablet 3  . metoprolol tartrate (LOPRESSOR) 25 MG tablet Take 1 tablet (25 mg total) by mouth 2 (two) times daily. 180 tablet 3  . Multiple Vitamin (MULTIVITAMIN WITH MINERALS) TABS tablet Take 1 tablet by mouth daily.    . pantoprazole (PROTONIX) 40 MG tablet Take 1 tablet (40 mg total) by mouth at bedtime. 30 tablet 1  . SPIRIVA HANDIHALER 18 MCG inhalation capsule INHALE 1 CAPSULE VIA HANDIHALER ONCE DAILY AT THE SAME TIME EVERY DAY 30 capsule 11  . vitamin B-12 (CYANOCOBALAMIN) 1000 MCG tablet Take 1,000 mcg by mouth daily.     Marland Kitchen zolpidem (AMBIEN) 5 MG tablet Take 1 tablet (5 mg total) by mouth at bedtime as needed for sleep. 30 tablet 0   No current facility-administered medications on file prior to visit.    No Known Allergies  Past Medical History:  Diagnosis Date  . Arthritis   . Automatic implantable cardioverter-defibrillator in situ   . COPD (chronic obstructive pulmonary disease) ([place])    emphysema by CXR  . Coronary artery disease   . Diabetes mellitus    type 2  no meds  . Diverticulosis of colon   . GERD (gastroesophageal reflux disease)    with esophagitis  . History of colonic polyps   .  Hypertension   . Non-ischemic cardiomyopathy ([place])   . Presence of permanent cardiac pacemaker   . PVD (peripheral vascular disease) ([place])    bilateral common iliac artery aneurysms. Right SFA occlusion over a long segment. Left SFA disease with occlusion of the left TP trunk. Artirogram Oct. 2006  . Sleep apnea   . Tobacco abuse   . Urinary incontinence    detrussor instability    Past Surgical History:  Procedure Laterality Date  . ABDOMINAL AORTIC ENDOVASCULAR STENT GRAFT N/A 05/07/2018   Procedure: ABDOMINAL AORTIC ENDOVASCULAR STENT GRAFT;  Surgeon: Conrad [place], MD;  Location: [place];  Service:  Vascular;  Laterality: N/A;  . CARDIAC CATHETERIZATION  06/2004   negative  . CARDIAC CATHETERIZATION  02/08/2020  . CLIPPING OF ATRIAL APPENDAGE N/A 02/11/2020   Procedure: Clipping Of Atrial Appendage using AtriCure 35 MM AtriClip.;  Surgeon: Wonda Olds, MD;  [facility];  Service: Open Heart Surgery;  Laterality: N/A;  . COLONOSCOPY  04/2005  . COPD exacerbation    . CORONARY ANGIOPLASTY  01/09/2018  . CORONARY ARTERY BYPASS GRAFT N/A 02/11/2020   Procedure: CORONARY ARTERY BYPASS GRAFTING (CABG) using LIMA to LAD; RIMA to PL; Endoscopic right greater saphenous vein harvest: SVC to Diag; SVC to OM1.;  Surgeon: Wonda Olds, MD;  Location: [place];  Service: Open Heart Surgery;  Laterality: N/A;  BILATERAL IMA  . CORONARY STENT INTERVENTION N/A 01/10/2018   Procedure: CORONARY STENT INTERVENTION;  Surgeon: Lorretta Harp, MD;  Location: [place] [facility];  Service: Cardiovascular;  Laterality: N/A;  . CORONARY/GRAFT ACUTE MI REVASCULARIZATION N/A 01/10/2018   Procedure: Coronary/Graft Acute MI Revascularization;  Surgeon: Lorretta Harp, MD;  Location: [place] [facility];  Service: Cardiovascular;  Laterality: N/A;  . EMBOLIZATION Left 03/19/2018  . EMBOLIZATION Left 03/19/2018   Procedure: EMBOLIZATION - Left Internal;  Surgeon: Conrad Wiley, MD;  Location: [place] [facility];  Service: Cardiovascular;  Laterality: Left;  . EMBOLIZATION Right 04/16/2018   Procedure: EMBOLIZATION;  Surgeon: Conrad [place], MD;  Location: [place] [facility];  Service: Cardiovascular;  Laterality: Right;  . ENDOVEIN HARVEST OF GREATER SAPHENOUS VEIN Right 02/11/2020   Procedure: [place] Of Greater Saphenous Vein using right lower extremity.;  Surgeon: Wonda Olds, MD;  Location: [facility] OR;  Service: Open Heart Surgery;  Laterality: Right;  . FEMORAL ARTERY EXPLORATION Right 05/07/2018   Procedure: FEMORAL ARTERY EXPLORATION, EXTENDED PROFUNDAPLASTY;  Surgeon: Conrad P[place]ar, MD;   Location: [place];  Service: Vascular;  Laterality: Right;  . INTRAOPERATIVE ARTERIOGRAM Right 05/07/2018   Procedure: INTRA OPERATIVE ARTERIOGRAM;  Surgeon: Conrad Basile, MD;  Location: [place];  Service: Vascular;  Laterality: Right;  . LEFT HEART CATH AND CORONARY ANGIOGRAPHY N/A 01/10/2018   Procedure: LEFT HEART CATH AND CORONARY ANGIOGRAPHY;  Surgeon: Lorretta Harp, MD;  Location: [place] [facility];  Service: Cardiovascular;  Laterality: N/A;  . LEFT HEART CATH AND CORONARY ANGIOGRAPHY N/A 02/08/2020   Procedure: LEFT HEART CATH AND CORONARY ANGIOGRAPHY;  Surgeon: Troy Sine, MD;  Location: [place] [facility];  Service: Cardiovascular;  Laterality: N/A;  . PACEMAKER INSERTION  11/2005  . PACEMAKER LEAD REMOVAL N/A 10/17/2014   Procedure: PACEMAKER LEAD REMOVAL;  Surgeon: Evans Lance, MD;  Location: [place];  Service: Cardiovascular;  Laterality: N/A;  . RENAL ANGIOGRAPHY Left 04/16/2018   Procedure: RENAL ANGIOGRAPHY;  Surgeon: Conrad , MD;  Location: [place] [facility];  Service: Cardiovascular;  Laterality: Left;  . s/p ICD placement      Medtronic Maximo #7232 single chamber  . TEE WITHOUT CARDIOVERSION N/A 02/11/2020   Procedure: TRANSESOPHAGEAL ECHOCARDIOGRAM (TEE);  Surgeon: Wonda Olds, MD;  Location: [place];  Service: Open Heart Surgery;  Laterality: N/A;    Family History  Problem Relation Age of Onset  . Stroke Father   . Coronary artery disease Maternal Aunt   . Heart failure Maternal Aunt   . Lung cancer Maternal Aunt   . Lung cancer Maternal Uncle   . Cancer Brother        Mouth  . Cancer Sister        throat    Social History   Socioeconomic History  . Marital status: Married    Spouse name: Not on file  . Number of children: 2  . Years of education: Not on file  . Highest education level: Not on file  Occupational History  . Occupation: Grave digger--now disabled  Tobacco Use  . Smoking status: Former Smoker    Packs/day: 2.00    Years:  15.00    Pack years: 30.00    Types: Cigarettes    Quit date: 01/09/2018    Years since quitting: 2.4  . Smokeless tobacco: Never Used  . Tobacco comment: GAVE 1-800-QUIT-NOW  Vaping Use  . Vaping Use: Never used  Substance and Sexual Activity  . Alcohol use: No  . Drug use: No  . Sexual activity: Not Currently  Other Topics Concern  . Not on file  Social History Narrative   No living will   Requests wife as health care POA   Would accept resuscitation but doesn't want prolonged ventilation   No tube feeds if cognitively unaware   Social Determinants of Health   Financial Resource Strain:   . Difficulty of Paying Living Expenses:   Food Insecurity:   . Worried About Charity fundraiser in the Last Year:   . Arboriculturist in the Last Year:   Transportation Needs:   . Film/video editor (Medical):   Marland Kitchen Lack of Transportation (Non-Medical):   Physical Activity:   . Days of Exercise per Week:   . Minutes of Exercise per Session:   Stress:   . Feeling of Stress :   Social Connections:   . Frequency of Communication with Friends and Family:   . Frequency of Social Gatherings with Friends and Family:   . Attends Religious Services:   . Active Member of Clubs or Organizations:   . Attends Archivist Meetings:   Marland Kitchen Marital Status:   Intimate Partner Violence:   . Fear of Current or Ex-Partner:   . Emotionally Abused:   Marland Kitchen Physically Abused:   . Sexually Abused:    Review of Systems Appetite is okay Weight is up considerably    Objective:   Physical Exam  Cardiovascular: Normal rate and regular rhythm. Exam reveals no gallop.  No murmur heard. Pedal pulses absent  Respiratory: Effort normal. He has no wheezes. He has no rales.  GI: Normal appearance.  Musculoskeletal:     Right lower leg: No edema.     Left lower leg: No edema.  Neurological: He is alert.  Decreased sensation in feet  Skin:  No foot lesions  Psychiatric:  Mild psychomotor  retardation Denies depression           Assessment & Plan:

## 2020-06-14 NOTE — Patient Instructions (Signed)
Please increase the furosemide to 40mg  daily Let me know if Dr Ander Slade doesn't contact you about an appointment

## 2020-06-30 DIAGNOSIS — G4733 Obstructive sleep apnea (adult) (pediatric): Secondary | ICD-10-CM | POA: Diagnosis not present

## 2020-06-30 DIAGNOSIS — I4819 Other persistent atrial fibrillation: Secondary | ICD-10-CM | POA: Diagnosis not present

## 2020-07-02 ENCOUNTER — Other Ambulatory Visit: Payer: Self-pay | Admitting: Internal Medicine

## 2020-07-06 DIAGNOSIS — G4733 Obstructive sleep apnea (adult) (pediatric): Secondary | ICD-10-CM | POA: Diagnosis not present

## 2020-07-18 ENCOUNTER — Ambulatory Visit (INDEPENDENT_AMBULATORY_CARE_PROVIDER_SITE_OTHER): Payer: BC Managed Care – PPO | Admitting: Internal Medicine

## 2020-07-18 ENCOUNTER — Encounter: Payer: Self-pay | Admitting: Internal Medicine

## 2020-07-18 ENCOUNTER — Other Ambulatory Visit: Payer: Self-pay

## 2020-07-18 VITALS — BP 126/72 | HR 96 | Temp 98.4°F | Ht 70.0 in | Wt 285.0 lb

## 2020-07-18 DIAGNOSIS — J439 Emphysema, unspecified: Secondary | ICD-10-CM

## 2020-07-18 DIAGNOSIS — I200 Unstable angina: Secondary | ICD-10-CM

## 2020-07-18 DIAGNOSIS — I5022 Chronic systolic (congestive) heart failure: Secondary | ICD-10-CM

## 2020-07-18 DIAGNOSIS — G4733 Obstructive sleep apnea (adult) (pediatric): Secondary | ICD-10-CM

## 2020-07-18 LAB — RENAL FUNCTION PANEL
Albumin: 4.2 g/dL (ref 3.5–5.2)
BUN: 13 mg/dL (ref 6–23)
CO2: 25 mEq/L (ref 19–32)
Calcium: 9.1 mg/dL (ref 8.4–10.5)
Chloride: 102 mEq/L (ref 96–112)
Creatinine, Ser: 0.88 mg/dL (ref 0.40–1.50)
GFR: 87.28 mL/min (ref >60.00)
Glucose, Bld: 196 mg/dL — ABNORMAL HIGH (ref 70–99)
Phosphorus: 2.4 mg/dL (ref 2.3–4.6)
Potassium: 3.8 mEq/L (ref 3.5–5.1)
Sodium: 135 mEq/L (ref 135–145)

## 2020-07-18 LAB — BRAIN NATRIURETIC PEPTIDE: Pro B Natriuretic peptide (BNP): 146 pg/mL — ABNORMAL HIGH (ref 0.0–100.0)

## 2020-07-18 MED ORDER — SPIRONOLACTONE 25 MG PO TABS: 25.0000 mg | ORAL_TABLET | Freq: Every day | ORAL | 11 refills | Status: DC

## 2020-07-18 NOTE — Assessment & Plan Note (Signed)
If BNP is normal, COPD may be the main reason for his respiratory decompensation Will try to get him back in with pulmonary

## 2020-07-18 NOTE — Assessment & Plan Note (Signed)
Not clear that his CPAP is effective Needs pulmonary reevaluation

## 2020-07-18 NOTE — Progress Notes (Signed)
Subjective:    Patient ID: [name], male    DOB: 28-Oct-1956, 64 y.o.   MRN: [id number]  HPI Here for follow up of CHF and breathing problems This visit occurred during the SARS-CoV-2 public health emergency.  Safety protocols were in place, including screening questions prior to the visit, additional usage of staff PPE, and extensive cleaning of exam room while observing appropriate contact time as indicated for disinfecting solutions.   Breathing is not better--and may be worse Doesn't feel that the increased diuretic is helping Voids a lot in day though Not voiding at night though Still props at night Some PND at times---not sure if it is related to the CPAP Using inhaler in the day with any activity Tired in day--needs 1PM nap-----but can't catch breath and needs inhaler to lie back  No chest pain No palpitations Some lightheadedness--no syncope  Current Outpatient Medications on File Prior to Visit  Medication Sig Dispense Refill   acetaminophen (TYLENOL) 500 MG tablet Take 1,000 mg by mouth every 6 (six) hours as needed (for pain.).     albuterol (VENTOLIN HFA) 108 (90 Base) MCG/ACT inhaler TAKE 2 PUFFS BY MOUTH EVERY 6 HOURS AS NEEDED FOR WHEEZE OR SHORTNESS OF BREATH *USE W/ SPACER* 8.5 g 0   aspirin 81 MG chewable tablet Chew 1 tablet (81 mg total) by mouth daily.     atorvastatin (LIPITOR) 80 MG tablet Take 1 tablet (80 mg total) by mouth daily at 6 PM. 30 tablet 1   clopidogrel (PLAVIX) 75 MG tablet Take 1 tablet (75 mg total) by mouth daily. 30 tablet 1   furosemide (LASIX) 40 MG tablet Take 1 tablet (40 mg total) by mouth daily. 90 tablet 3   glipiZIDE (GLUCOTROL) 5 MG tablet TAKE 1 TABLET (5 MG TOTAL) BY MOUTH 2 (TWO) TIMES DAILY BEFORE A MEAL. 180 tablet 0   metFORMIN (GLUCOPHAGE-XR) 500 MG 24 hr tablet TAKE 2 TABLETS BY MOUTH EVERY DAY WITH BREAKFAST (Patient taking differently: Take 1,000 mg by mouth daily with breakfast. ) 180 tablet 3   metoprolol  tartrate (LOPRESSOR) 25 MG tablet Take 1 tablet (25 mg total) by mouth 2 (two) times daily. 180 tablet 3   Multiple Vitamin (MULTIVITAMIN WITH MINERALS) TABS tablet Take 1 tablet by mouth daily.     pantoprazole (PROTONIX) 40 MG tablet Take 1 tablet (40 mg total) by mouth at bedtime. 30 tablet 1   SPIRIVA HANDIHALER 18 MCG inhalation capsule INHALE 1 CAPSULE VIA HANDIHALER ONCE DAILY AT THE SAME TIME EVERY DAY 30 capsule 11   vitamin B-12 (CYANOCOBALAMIN) 1000 MCG tablet Take 1,000 mcg by mouth daily.      zolpidem (AMBIEN) 5 MG tablet Take 1 tablet (5 mg total) by mouth at bedtime as needed for sleep. 30 tablet 0   No current facility-administered medications on file prior to visit.    No Known Allergies  Past Medical History:  Diagnosis Date   Arthritis    Automatic implantable cardioverter-defibrillator in situ    COPD (chronic obstructive pulmonary disease) (HCC)    emphysema by CXR   Coronary artery disease    Diabetes mellitus    type 2  no meds   Diverticulosis of colon    GERD (gastroesophageal reflux disease)    with esophagitis   History of colonic polyps    Hypertension    Non-ischemic cardiomyopathy ([place])    Presence of permanent cardiac pacemaker    PVD (peripheral vascular disease) ([place])  bilateral common iliac artery aneurysms. Right SFA occlusion over a long segment. Left SFA disease with occlusion of the left TP trunk. Artirogram Oct. 2006   Sleep apnea    Tobacco abuse    Urinary incontinence    detrussor instability    Past Surgical History:  Procedure Laterality Date   ABDOMINAL AORTIC ENDOVASCULAR STENT GRAFT N/A 05/07/2018   Procedure: ABDOMINAL AORTIC ENDOVASCULAR STENT GRAFT;  Surgeon: Conrad Florissant, MD;  Location: [facility];  Service: Vascular;  Laterality: N/A;   CARDIAC CATHETERIZATION  06/2004   negative   CARDIAC CATHETERIZATION  02/08/2020   CLIPPING OF ATRIAL APPENDAGE N/A 02/11/2020   Procedure: Clipping Of Atrial  Appendage using AtriCure 35 MM AtriClip.;  Surgeon: Wonda Olds, MD;  [facility];  Service: Open Heart Surgery;  Laterality: N/A;   COLONOSCOPY  04/2005   COPD exacerbation     CORONARY ANGIOPLASTY  01/09/2018   CORONARY ARTERY BYPASS GRAFT N/A 02/11/2020   Procedure: CORONARY ARTERY BYPASS GRAFTING (CABG) using LIMA to LAD; RIMA to PL; Endoscopic right greater saphenous vein harvest: SVC to Diag; SVC to OM1.;  Surgeon: Wonda Olds, MD;  Location: [facility];  Service: Open Heart Surgery;  Laterality: N/A;  BILATERAL IMA   CORONARY STENT INTERVENTION N/A 01/10/2018   Procedure: CORONARY STENT INTERVENTION;  Surgeon: Lorretta Harp, MD;  Location: [facility];  Service: Cardiovascular;  Laterality: N/A;   CORONARY/GRAFT ACUTE MI REVASCULARIZATION N/A 01/10/2018   Procedure: Coronary/Graft Acute MI Revascularization;  Surgeon: Lorretta Harp, MD;  Location: [place] [facility];  Service: Cardiovascular;  Laterality: N/A;   EMBOLIZATION Left 03/19/2018   EMBOLIZATION Left 03/19/2018   Procedure: EMBOLIZATION - Left Internal;  Surgeon: Conrad Mahaffey, MD;  Location: [place] [facility];  Service: Cardiovascular;  Laterality: Left;   EMBOLIZATION Right 04/16/2018   Procedure: EMBOLIZATION;  Surgeon: Conrad Soper, MD;  Location: [facility];  Service: Cardiovascular;  Laterality: Right;   ENDOVEIN HARVEST OF GREATER SAPHENOUS VEIN Right 02/11/2020   Procedure: [place] Of Greater Saphenous Vein using right lower extremity.;  Surgeon: Wonda Olds, MD;  Location: [facility];  Service: Open Heart Surgery;  Laterality: Right;   FEMORAL ARTERY EXPLORATION Right 05/07/2018   Procedure: FEMORAL ARTERY EXPLORATION, EXTENDED PROFUNDAPLASTY;  Surgeon: Conrad [place], MD;  Location: [facility];  Service: Vascular;  Laterality: Right;   INTRAOPERATIVE ARTERIOGRAM Right 05/07/2018   Procedure: INTRA OPERATIVE ARTERIOGRAM;  Surgeon: Conrad Bessemer, MD;  Location: [facility];  Service:  Vascular;  Laterality: Right;   LEFT HEART CATH AND CORONARY ANGIOGRAPHY N/A 01/10/2018   Procedure: LEFT HEART CATH AND CORONARY ANGIOGRAPHY;  Surgeon: Lorretta Harp, MD;  Location: [place] [facility];  Service: Cardiovascular;  Laterality: N/A;   LEFT HEART CATH AND CORONARY ANGIOGRAPHY N/A 02/08/2020   Procedure: LEFT HEART CATH AND CORONARY ANGIOGRAPHY;  Surgeon: Troy Sine, MD;  Location: [place] [facility];  Service: Cardiovascular;  Laterality: N/A;   PACEMAKER INSERTION  11/2005   PACEMAKER LEAD REMOVAL N/A 10/17/2014   Procedure: PACEMAKER LEAD REMOVAL;  Surgeon: Evans Lance, MD;  Location: [facility];  Service: Cardiovascular;  Laterality: N/A;   RENAL ANGIOGRAPHY Left 04/16/2018   Procedure: RENAL ANGIOGRAPHY;  Surgeon: Conrad Jewett, MD;  Location: [place] [facility];  Service: Cardiovascular;  Laterality: Left;   s/p ICD placement      Medtronic Maximo [phone] single chamber   TEE WITHOUT CARDIOVERSION N/A  02/11/2020   Procedure: TRANSESOPHAGEAL ECHOCARDIOGRAM (TEE);  Surgeon: Wonda Olds, MD;  Location: [facility];  Service: Open Heart Surgery;  Laterality: N/A;    Family History  Problem Relation Age of Onset   Stroke Father    Coronary artery disease Maternal Aunt    Heart failure Maternal Aunt    Lung cancer Maternal Aunt    Lung cancer Maternal Uncle    Cancer Brother        Mouth   Cancer Sister        throat    Social History   Socioeconomic History   Marital status: Married    Spouse name: Not on file   Number of children: 2   Years of education: Not on file   Highest education level: Not on file  Occupational History   Occupation: Grave digger--now disabled  Tobacco Use   Smoking status: Former Smoker    Packs/day: 2.00    Years: 15.00    Pack years: 30.00    Types: Cigarettes    Quit date: 01/09/2018    Years since quitting: 2.5   Smokeless tobacco: Never Used   Tobacco comment: GAVE 1-800-QUIT-NOW  Vaping Use   Vaping  Use: Never used  Substance and Sexual Activity   Alcohol use: No   Drug use: No   Sexual activity: Not Currently  Other Topics Concern   Not on file  Social History Narrative   No living will   Requests wife as health care POA   Would accept resuscitation but doesn't want prolonged ventilation   No tube feeds if cognitively unaware   Social Determinants of Health   Financial Resource Strain:    Difficulty of Paying Living Expenses:   Food Insecurity:    Worried About Charity fundraiser in the Last Year:    Arboriculturist in the Last Year:   Transportation Needs:    Film/video editor (Medical):    Lack of Transportation (Non-Medical):   Physical Activity:    Days of Exercise per Week:    Minutes of Exercise per Session:   Stress:    Feeling of Stress :   Social Connections:    Frequency of Communication with Friends and Family:    Frequency of Social Gatherings with Friends and Family:    Attends Religious Services:    Active Member of Clubs or Organizations:    Attends Music therapist:    Marital Status:   Intimate Partner Violence:    Fear of Current or Ex-Partner:    Emotionally Abused:    Physically Abused:    Sexually Abused:    Review of Systems  Eating fine No sig edema     Objective:   Physical Exam Constitutional:      Appearance: Normal appearance.  Cardiovascular:     Rate and Rhythm: Normal rate and regular rhythm.     Heart sounds: No murmur heard.  No gallop.   Pulmonary:     Effort: Pulmonary effort is normal.     Breath sounds: No wheezing or rales.     Comments: Decreased breath sounds but clear Musculoskeletal:     Right lower leg: No edema.     Left lower leg: No edema.            Assessment & Plan:

## 2020-07-18 NOTE — Assessment & Plan Note (Signed)
Still with significant DOE, orthopnea and PND Weight not down with increased furosemide Will check BNP Add low dose aldactone Will ask Dr Percival Spanish to see him again soon

## 2020-07-28 ENCOUNTER — Telehealth: Payer: Self-pay | Admitting: *Deleted

## 2020-07-28 NOTE — Telephone Encounter (Signed)
Spoke with patient and scheduled next available 8/23

## 2020-07-28 NOTE — Telephone Encounter (Signed)
-----   Message from Minus Breeding, MD sent at 07/23/2020  1:53 PM EDT ----- Hi,  Can we get him in next available with me?   Thanks.   ----- Message ----- From: Venia Carbon, MD Sent: 07/18/2020  11:23 AM EDT To: Minus Breeding, MD  Maylon Cos, He is having ongoing DOE, orthopnea, etc. I increased lasix --no help. Checking BNP and adding low dose aldactone. Can you see him soon to help figure out what is going on? (needs pulmonary too) Rich

## 2020-07-31 DIAGNOSIS — I4819 Other persistent atrial fibrillation: Secondary | ICD-10-CM | POA: Diagnosis not present

## 2020-07-31 DIAGNOSIS — G4733 Obstructive sleep apnea (adult) (pediatric): Secondary | ICD-10-CM | POA: Diagnosis not present

## 2020-08-03 ENCOUNTER — Other Ambulatory Visit: Payer: Self-pay | Admitting: Internal Medicine

## 2020-08-06 ENCOUNTER — Other Ambulatory Visit: Payer: Self-pay | Admitting: Cardiology

## 2020-08-08 ENCOUNTER — Encounter: Payer: Self-pay | Admitting: Internal Medicine

## 2020-08-08 ENCOUNTER — Ambulatory Visit (INDEPENDENT_AMBULATORY_CARE_PROVIDER_SITE_OTHER): Payer: BC Managed Care – PPO | Admitting: Internal Medicine

## 2020-08-08 ENCOUNTER — Other Ambulatory Visit: Payer: Self-pay

## 2020-08-08 VITALS — BP 130/70 | HR 78 | Temp 97.6°F | Ht 70.0 in | Wt 285.0 lb

## 2020-08-08 DIAGNOSIS — I5022 Chronic systolic (congestive) heart failure: Secondary | ICD-10-CM | POA: Diagnosis not present

## 2020-08-08 DIAGNOSIS — I200 Unstable angina: Secondary | ICD-10-CM | POA: Diagnosis not present

## 2020-08-08 DIAGNOSIS — J439 Emphysema, unspecified: Secondary | ICD-10-CM | POA: Diagnosis not present

## 2020-08-08 LAB — RENAL FUNCTION PANEL
Albumin: 4.2 g/dL (ref 3.5–5.2)
BUN: 15 mg/dL (ref 6–23)
CO2: 27 mEq/L (ref 19–32)
Calcium: 9.2 mg/dL (ref 8.4–10.5)
Chloride: 101 mEq/L (ref 96–112)
Creatinine, Ser: 1.03 mg/dL (ref 0.40–1.50)
GFR: 72.77 mL/min (ref >60.00)
Glucose, Bld: 262 mg/dL — ABNORMAL HIGH (ref 70–99)
Phosphorus: 3.1 mg/dL (ref 2.3–4.6)
Potassium: 4.4 mEq/L (ref 3.5–5.1)
Sodium: 137 mEq/L (ref 135–145)

## 2020-08-08 NOTE — Assessment & Plan Note (Signed)
I believe a lot of his decompensation is pulmonary  Will try to set him up with a different pulmonary doctor (has not heard back from his in months)

## 2020-08-08 NOTE — Assessment & Plan Note (Signed)
Maybe just slightly better with the spironolactone Will continue--likely has some degree of secondary hyperaldo state Will check renal profile Seeing Dr Percival Spanish next week

## 2020-08-08 NOTE — Progress Notes (Signed)
Subjective:    Patient ID: [name], male    DOB: 1956-06-28, 64 y.o.   MRN: [id number]  HPI Here for follow up of increased SOB This visit occurred during the SARS-CoV-2 public health emergency.  Safety protocols were in place, including screening questions prior to the visit, additional usage of staff PPE, and extensive cleaning of exam room while observing appropriate contact time as indicated for disinfecting solutions.   Hasn't noticed any change with the new medication Breathing is about the same--may be just slightly better Is seeing the cardiologist next week No word from the pulmonary doctor---is ready to try someone else  Regular cough---with green phlegm CPAP does help a little---still wakes 5-7 times at night Does awaken a little refreshed  Current Outpatient Medications on File Prior to Visit  Medication Sig Dispense Refill  . acetaminophen (TYLENOL) 500 MG tablet Take 1,000 mg by mouth every 6 (six) hours as needed (for pain.).    Marland Kitchen albuterol (VENTOLIN HFA) 108 (90 Base) MCG/ACT inhaler TAKE 2 PUFFS BY MOUTH EVERY 6 HOURS AS NEEDED FOR WHEEZE OR SHORTNESS OF BREATH *USE W/ SPACER* 8.5 g 0  . aspirin 81 MG chewable tablet Chew 1 tablet (81 mg total) by mouth daily.    Marland Kitchen atorvastatin (LIPITOR) 80 MG tablet Take 1 tablet (80 mg total) by mouth daily at 6 PM. 30 tablet 1  . clopidogrel (PLAVIX) 75 MG tablet Take 1 tablet (75 mg total) by mouth daily. 30 tablet 1  . furosemide (LASIX) 40 MG tablet Take 1 tablet (40 mg total) by mouth daily. 90 tablet 3  . glipiZIDE (GLUCOTROL) 5 MG tablet TAKE 1 TABLET (5 MG TOTAL) BY MOUTH 2 (TWO) TIMES DAILY BEFORE A MEAL. 180 tablet 3  . metFORMIN (GLUCOPHAGE-XR) 500 MG 24 hr tablet TAKE 2 TABLETS BY MOUTH EVERY DAY WITH BREAKFAST (Patient taking differently: Take 1,000 mg by mouth daily with breakfast. ) 180 tablet 3  . metoprolol tartrate (LOPRESSOR) 25 MG tablet Take 1 tablet (25 mg total) by mouth 2 (two) times daily. 180 tablet 3    . Multiple Vitamin (MULTIVITAMIN WITH MINERALS) TABS tablet Take 1 tablet by mouth daily.    . pantoprazole (PROTONIX) 40 MG tablet Take 1 tablet (40 mg total) by mouth at bedtime. 30 tablet 1  . SPIRIVA HANDIHALER 18 MCG inhalation capsule INHALE 1 CAPSULE VIA HANDIHALER ONCE DAILY AT THE SAME TIME EVERY DAY 30 capsule 11  . spironolactone (ALDACTONE) 25 MG tablet Take 1 tablet (25 mg total) by mouth daily. 30 tablet 11  . vitamin B-12 (CYANOCOBALAMIN) 1000 MCG tablet Take 1,000 mcg by mouth daily.     Marland Kitchen zolpidem (AMBIEN) 5 MG tablet Take 1 tablet (5 mg total) by mouth at bedtime as needed for sleep. 30 tablet 0   No current facility-administered medications on file prior to visit.    No Known Allergies  Past Medical History:  Diagnosis Date  . Arthritis   . Automatic implantable cardioverter-defibrillator in situ   . COPD (chronic obstructive pulmonary disease) ([place])    emphysema by CXR  . Coronary artery disease   . Diabetes mellitus    type 2  no meds  . Diverticulosis of colon   . GERD (gastroesophageal reflux disease)    with esophagitis  . History of colonic polyps   . Hypertension   . Non-ischemic cardiomyopathy ([place])   . Presence of permanent cardiac pacemaker   . PVD (peripheral vascular disease) ([place])  bilateral common iliac artery aneurysms. Right SFA occlusion over a long segment. Left SFA disease with occlusion of the left TP trunk. Artirogram Oct. 2006  . Sleep apnea   . Tobacco abuse   . Urinary incontinence    detrussor instability    Past Surgical History:  Procedure Laterality Date  . ABDOMINAL AORTIC ENDOVASCULAR STENT GRAFT N/A 05/07/2018   Procedure: ABDOMINAL AORTIC ENDOVASCULAR STENT GRAFT;  Surgeon: Conrad Lindsay, MD;  Location: [facility];  Service: Vascular;  Laterality: N/A;  . CARDIAC CATHETERIZATION  06/2004   negative  . CARDIAC CATHETERIZATION  02/08/2020  . CLIPPING OF ATRIAL APPENDAGE N/A 02/11/2020   Procedure: Clipping Of Atrial Appendage  using AtriCure 35 MM AtriClip.;  Surgeon: Wonda Olds, MD;  [facility];  Service: Open Heart Surgery;  Laterality: N/A;  . COLONOSCOPY  04/2005  . COPD exacerbation    . CORONARY ANGIOPLASTY  01/09/2018  . CORONARY ARTERY BYPASS GRAFT N/A 02/11/2020   Procedure: CORONARY ARTERY BYPASS GRAFTING (CABG) using LIMA to LAD; RIMA to PL; Endoscopic right greater saphenous vein harvest: SVC to Diag; SVC to OM1.;  Surgeon: Wonda Olds, MD;  Location: [facility];  Service: Open Heart Surgery;  Laterality: N/A;  BILATERAL IMA  . CORONARY STENT INTERVENTION N/A 01/10/2018   Procedure: CORONARY STENT INTERVENTION;  Surgeon: Lorretta Harp, MD;  Location: [facility] [facility];  Service: Cardiovascular;  Laterality: N/A;  . CORONARY/GRAFT ACUTE MI REVASCULARIZATION N/A 01/10/2018   Procedure: Coronary/Graft Acute MI Revascularization;  Surgeon: Lorretta Harp, MD;  Location: [place] [facility];  Service: Cardiovascular;  Laterality: N/A;  . EMBOLIZATION Left 03/19/2018  . EMBOLIZATION Left 03/19/2018   Procedure: EMBOLIZATION - Left Internal;  Surgeon: Conrad [place], MD;  Location: [facility];  Service: Cardiovascular;  Laterality: Left;  . EMBOLIZATION Right 04/16/2018   Procedure: EMBOLIZATION;  Surgeon: Conrad Echelon, MD;  Location: [facility];  Service: Cardiovascular;  Laterality: Right;  . ENDOVEIN HARVEST OF GREATER SAPHENOUS VEIN Right 02/11/2020   Procedure: [place] Of Greater Saphenous Vein using right lower extremity.;  Surgeon: Wonda Olds, MD;  Location: [facility];  Service: Open Heart Surgery;  Laterality: Right;  . FEMORAL ARTERY EXPLORATION Right 05/07/2018   Procedure: FEMORAL ARTERY EXPLORATION, EXTENDED PROFUNDAPLASTY;  Surgeon: Conrad [place], MD;  Location: [facility];  Service: Vascular;  Laterality: Right;  . INTRAOPERATIVE ARTERIOGRAM Right 05/07/2018   Procedure: INTRA OPERATIVE ARTERIOGRAM;  Surgeon: Conrad [place], MD;  Location: [facility];  Service: Vascular;   Laterality: Right;  . LEFT HEART CATH AND CORONARY ANGIOGRAPHY N/A 01/10/2018   Procedure: LEFT HEART CATH AND CORONARY ANGIOGRAPHY;  Surgeon: Lorretta Harp, MD;  Location: [facility];  Service: Cardiovascular;  Laterality: N/A;  . LEFT HEART CATH AND CORONARY ANGIOGRAPHY N/A 02/08/2020   Procedure: LEFT HEART CATH AND CORONARY ANGIOGRAPHY;  Surgeon: Troy Sine, MD;  Location: [facility] [facility];  Service: Cardiovascular;  Laterality: N/A;  . PACEMAKER INSERTION  11/2005  . PACEMAKER LEAD REMOVAL N/A 10/17/2014   Procedure: PACEMAKER LEAD REMOVAL;  Surgeon: Evans Lance, MD;  Location: [facility];  Service: Cardiovascular;  Laterality: N/A;  . RENAL ANGIOGRAPHY Left 04/16/2018   Procedure: RENAL ANGIOGRAPHY;  Surgeon: Conrad [place], MD;  Location: [facility];  Service: Cardiovascular;  Laterality: Left;  . s/p ICD placement      Medtronic Maximo #[id number] single chamber  . TEE WITHOUT CARDIOVERSION N/A  02/11/2020   Procedure: TRANSESOPHAGEAL ECHOCARDIOGRAM (TEE);  Surgeon: Wonda Olds, MD;  Location: [facility];  Service: Open Heart Surgery;  Laterality: N/A;    Family History  Problem Relation Age of Onset  . Stroke Father   . Coronary artery disease Maternal Aunt   . Heart failure Maternal Aunt   . Lung cancer Maternal Aunt   . Lung cancer Maternal Uncle   . Cancer Brother        Mouth  . Cancer Sister        throat    Social History   Socioeconomic History  . Marital status: Married    Spouse name: Not on file  . Number of children: 2  . Years of education: Not on file  . Highest education level: Not on file  Occupational History  . Occupation: Grave digger--now disabled  Tobacco Use  . Smoking status: Former Smoker    Packs/day: 2.00    Years: 15.00    Pack years: 30.00    Types: Cigarettes    Quit date: 01/09/2018    Years since quitting: 2.5  . Smokeless tobacco: Never Used  . Tobacco comment: GAVE 1-800-QUIT-NOW  Vaping Use  . Vaping Use: Never  used  Substance and Sexual Activity  . Alcohol use: No  . Drug use: No  . Sexual activity: Not Currently  Other Topics Concern  . Not on file  Social History Narrative   No living will   Requests wife as health care POA   Would accept resuscitation but doesn't want prolonged ventilation   No tube feeds if cognitively unaware   Social Determinants of Health   Financial Resource Strain:   . Difficulty of Paying Living Expenses:   Food Insecurity:   . Worried About Charity fundraiser in the Last Year:   . Arboriculturist in the Last Year:   Transportation Needs:   . Film/video editor (Medical):   Marland Kitchen Lack of Transportation (Non-Medical):   Physical Activity:   . Days of Exercise per Week:   . Minutes of Exercise per Session:   Stress:   . Feeling of Stress :   Social Connections:   . Frequency of Communication with Friends and Family:   . Frequency of Social Gatherings with Friends and Family:   . Attends Religious Services:   . Active Member of Clubs or Organizations:   . Attends Archivist Meetings:   Marland Kitchen Marital Status:   Intimate Partner Violence:   . Fear of Current or Ex-Partner:   . Emotionally Abused:   Marland Kitchen Physically Abused:   . Sexually Abused:    Review of Systems  No nocturia since on the fluid pills Appetite is fine     Objective:   Physical Exam Constitutional:      Appearance: Normal appearance.  Cardiovascular:     Rate and Rhythm: Normal rate and regular rhythm.     Heart sounds: No murmur heard.  No gallop.   Pulmonary:     Effort: Pulmonary effort is normal.     Breath sounds: Normal breath sounds. No wheezing or rales.     Comments: No dullness Musculoskeletal:     Cervical back: Neck supple.  Lymphadenopathy:     Cervical: No cervical adenopathy.  Neurological:     Mental Status: He is alert.            Assessment & Plan:

## 2020-08-09 ENCOUNTER — Emergency Department (HOSPITAL_COMMUNITY)
Admission: EM | Admit: 2020-08-09 | Discharge: 2020-08-09 | Disposition: A | Discharge status: Home / Self Care | Payer: BC Managed Care – PPO | Attending: Emergency Medicine | Admitting: Emergency Medicine

## 2020-08-09 ENCOUNTER — Other Ambulatory Visit: Payer: Self-pay | Admitting: Adult Health

## 2020-08-09 ENCOUNTER — Other Ambulatory Visit: Payer: Self-pay

## 2020-08-09 DIAGNOSIS — Z87891 Personal history of nicotine dependence: Secondary | ICD-10-CM | POA: Insufficient documentation

## 2020-08-09 DIAGNOSIS — Z951 Presence of aortocoronary bypass graft: Secondary | ICD-10-CM | POA: Diagnosis not present

## 2020-08-09 DIAGNOSIS — Z955 Presence of coronary angioplasty implant and graft: Secondary | ICD-10-CM | POA: Insufficient documentation

## 2020-08-09 DIAGNOSIS — Y999 Unspecified external cause status: Secondary | ICD-10-CM | POA: Insufficient documentation

## 2020-08-09 DIAGNOSIS — Z7984 Long term (current) use of oral hypoglycemic drugs: Secondary | ICD-10-CM | POA: Diagnosis not present

## 2020-08-09 DIAGNOSIS — Z7982 Long term (current) use of aspirin: Secondary | ICD-10-CM | POA: Diagnosis not present

## 2020-08-09 DIAGNOSIS — I480 Paroxysmal atrial fibrillation: Secondary | ICD-10-CM | POA: Insufficient documentation

## 2020-08-09 DIAGNOSIS — X033XXA Fall due to controlled fire, not in building or structure, initial encounter: Secondary | ICD-10-CM | POA: Insufficient documentation

## 2020-08-09 DIAGNOSIS — T2029XA Burn of second degree of multiple sites of head, face, and neck, initial encounter: Secondary | ICD-10-CM | POA: Diagnosis not present

## 2020-08-09 DIAGNOSIS — J449 Chronic obstructive pulmonary disease, unspecified: Secondary | ICD-10-CM | POA: Diagnosis not present

## 2020-08-09 DIAGNOSIS — Z7902 Long term (current) use of antithrombotics/antiplatelets: Secondary | ICD-10-CM | POA: Insufficient documentation

## 2020-08-09 DIAGNOSIS — T310 Burns involving less than 10% of body surface: Secondary | ICD-10-CM | POA: Diagnosis not present

## 2020-08-09 DIAGNOSIS — I10 Essential (primary) hypertension: Secondary | ICD-10-CM | POA: Diagnosis not present

## 2020-08-09 DIAGNOSIS — E1140 Type 2 diabetes mellitus with diabetic neuropathy, unspecified: Secondary | ICD-10-CM | POA: Insufficient documentation

## 2020-08-09 DIAGNOSIS — Y93H9 Activity, other involving exterior property and land maintenance, building and construction: Secondary | ICD-10-CM | POA: Diagnosis not present

## 2020-08-09 DIAGNOSIS — Z79899 Other long term (current) drug therapy: Secondary | ICD-10-CM | POA: Diagnosis not present

## 2020-08-09 DIAGNOSIS — I2510 Atherosclerotic heart disease of native coronary artery without angina pectoris: Secondary | ICD-10-CM | POA: Insufficient documentation

## 2020-08-09 DIAGNOSIS — T2020XA Burn of second degree of head, face, and neck, unspecified site, initial encounter: Secondary | ICD-10-CM | POA: Diagnosis not present

## 2020-08-09 DIAGNOSIS — T2009XA Burn of unspecified degree of multiple sites of head, face, and neck, initial encounter: Secondary | ICD-10-CM | POA: Diagnosis present

## 2020-08-09 DIAGNOSIS — Y92017 Garden or yard in single-family (private) house as the place of occurrence of the external cause: Secondary | ICD-10-CM | POA: Diagnosis not present

## 2020-08-09 MED ORDER — BACITRACIN ZINC 500 UNIT/GM EX OINT
1.0000 | TOPICAL_OINTMENT | Freq: Three times a day (TID) | CUTANEOUS | 0 refills | Status: DC
Start: 2020-08-09 — End: 2020-11-08

## 2020-08-09 MED ORDER — BACITRACIN ZINC 500 UNIT/GM EX OINT
TOPICAL_OINTMENT | Freq: Once | CUTANEOUS | Status: AC
  Administered 2020-08-09: 2 via TOPICAL
  Filled 2020-08-09: qty 1.8

## 2020-08-09 MED ORDER — HYDROCODONE-ACETAMINOPHEN 5-325 MG PO TABS
1.0000 | ORAL_TABLET | Freq: Once | ORAL | Status: AC
  Administered 2020-08-09: 1 via ORAL
  Filled 2020-08-09: qty 1

## 2020-08-09 NOTE — Discharge Instructions (Addendum)
Ask your doctor when your last tetanus shot was. You may need an update if it has been more than 10 years.
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
## 2020-08-10 ENCOUNTER — Telehealth: Payer: Self-pay

## 2020-08-10 NOTE — Telephone Encounter (Signed)
Spoke to pt. He said the blisters have popped and he is feeling better. Will call for appt if things change.

## 2020-08-14 ENCOUNTER — Ambulatory Visit: Payer: BC Managed Care – PPO | Admitting: Cardiology

## 2020-08-27 ENCOUNTER — Other Ambulatory Visit: Payer: Self-pay | Admitting: Cardiology

## 2020-08-31 DIAGNOSIS — I4819 Other persistent atrial fibrillation: Secondary | ICD-10-CM | POA: Diagnosis not present

## 2020-08-31 DIAGNOSIS — G4733 Obstructive sleep apnea (adult) (pediatric): Secondary | ICD-10-CM | POA: Diagnosis not present

## 2020-09-12 ENCOUNTER — Other Ambulatory Visit: Payer: Self-pay | Admitting: Internal Medicine

## 2020-09-14 DIAGNOSIS — I4819 Other persistent atrial fibrillation: Secondary | ICD-10-CM | POA: Diagnosis not present

## 2020-09-22 ENCOUNTER — Other Ambulatory Visit: Payer: Self-pay

## 2020-09-22 ENCOUNTER — Ambulatory Visit (INDEPENDENT_AMBULATORY_CARE_PROVIDER_SITE_OTHER): Payer: BC Managed Care – PPO | Admitting: Pulmonary Disease

## 2020-09-22 DIAGNOSIS — I2510 Atherosclerotic heart disease of native coronary artery without angina pectoris: Secondary | ICD-10-CM

## 2020-09-22 DIAGNOSIS — J439 Emphysema, unspecified: Secondary | ICD-10-CM

## 2020-09-22 LAB — PULMONARY FUNCTION TEST
DL/VA % pred: 118 %
DL/VA: 4.97 ml/min/mmHg/L
DLCO cor % pred: 114 %
DLCO cor: 30.11 ml/min/mmHg
DLCO unc % pred: 114 %
DLCO unc: 30.11 ml/min/mmHg
FEF 25-75 Post: 2.54 L/sec
FEF 25-75 Pre: 1.23 L/sec
FEF2575-%Change-Post: 106 %
FEF2575-%Pred-Post: 93 %
FEF2575-%Pred-Pre: 45 %
FEV1-%Change-Post: 42 %
FEV1-%Pred-Post: 74 %
FEV1-%Pred-Pre: 52 %
FEV1-Post: 2.52 L
FEV1-Pre: 1.77 L
FEV1FVC-%Change-Post: 24 %
FEV1FVC-%Pred-Pre: 77 %
FEV6-%Change-Post: 13 %
FEV6-%Pred-Post: 80 %
FEV6-%Pred-Pre: 71 %
FEV6-Post: 3.46 L
FEV6-Pre: 3.05 L
FEV6FVC-%Pred-Post: 105 %
FEV6FVC-%Pred-Pre: 105 %
FVC-%Change-Post: 14 %
FVC-%Pred-Post: 77 %
FVC-%Pred-Pre: 67 %
FVC-Post: 3.48 L
FVC-Pre: 3.05 L
Post FEV1/FVC ratio: 72 %
Post FEV6/FVC ratio: 100 %
Pre FEV1/FVC ratio: 58 %
Pre FEV6/FVC Ratio: 100 %
RV % pred: 145 %
RV: 3.27 L
TLC % pred: 96 %
TLC: 6.53 L

## 2020-09-22 NOTE — Progress Notes (Signed)
Cardiology Office Note   Date:  09/25/2020   ID:  [name], [name] 21-Sep-1956, MRN [id number]  PCP:  Venia Carbon, MD  Cardiologist:   Minus Breeding, MD  Chief Complaint  Patient presents with  . Shortness of Breath      History of Present Illness: [name] is a 64 y.o. male who presents who presents post CABG.  He had a history of CAD with cath in 2012demonstrating LAD a 60-70% stenosis, moderate size second diagonal with 50% stenosis, circumflex AV groove 75-80% stenosis with a branching obtuse marginal with a superior branch subtotal stenosis followed by diffuse disease, right coronary artery had nonobstructive disease. EF was 35%. He had a ICD placed, and at [facility], Dr.Taylor elected not to replace it. In Jan 2019he had an inferior MI . He underwent urgent DES to to the mid RCA and occluded left circumflex. EF on echo was 40 - 45%. He had some residual disease in the LAD and the plan was for out patient stress testing with PCIIf abnormal..He had a AAA repair 04/2018.  On 12/21/2019 the patient was seen in the ED with cough and was found to be positive for COVID-19.  He presented in Feb with chest pain.   He was sent for cath and had 3 vessel CAD and underwent CABG  X 4 by Dr. Orvan Seen.   Since I last saw him his biggest complaint has been falls.  He reports that because of leg weakness and back problems he falls and stumbles.  He actually has a walker today.  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  He is also complaining of shortness of breath.  He has been an active.  His weight is notably stable.  He does not have any overt swelling.  He has completed pulmonary function testing with results pending.  He is not describing new PND or orthopnea.  He is not having any new chest pressure, neck or arm discomfort.  He is not entirely sure of all the medicines he is takes.  He did not take them this morning.  He answers "I do not know  honestly," a lot of questions.  Past Medical History:  Diagnosis Date  . Arthritis   . Automatic implantable cardioverter-defibrillator in situ   . COPD (chronic obstructive pulmonary disease) ([place])    emphysema by CXR  . Coronary artery disease   . Diabetes mellitus    type 2  no meds  . Diverticulosis of colon   . GERD (gastroesophageal reflux disease)    with esophagitis  . History of colonic polyps   . Hypertension   . Non-ischemic cardiomyopathy ([place])   . Presence of permanent cardiac pacemaker   . PVD (peripheral vascular disease) ([place])    bilateral common iliac artery aneurysms. Right SFA occlusion over a long segment. Left SFA disease with occlusion of the left TP trunk. Artirogram Oct. 2006  . Sleep apnea   . Tobacco abuse   . Urinary incontinence    detrussor instability    Past Surgical History:  Procedure Laterality Date  . ABDOMINAL AORTIC ENDOVASCULAR STENT GRAFT N/A 05/07/2018   Procedure: ABDOMINAL AORTIC ENDOVASCULAR STENT GRAFT;  Surgeon: Conrad Burien, MD;  Location: [place];  Service: Vascular;  Laterality: N/A;  . CARDIAC CATHETERIZATION  06/2004   negative  . CARDIAC CATHETERIZATION  02/08/2020  . CLIPPING OF ATRIAL APPENDAGE N/A 02/11/2020   Procedure: Clipping  Of Atrial Appendage using AtriCure 35 MM AtriClip.;  Surgeon: Wonda Olds, MD;  Location: [facility];  Service: Open Heart Surgery;  Laterality: N/A;  . COLONOSCOPY  04/2005  . COPD exacerbation    . CORONARY ANGIOPLASTY  01/09/2018  . CORONARY ARTERY BYPASS GRAFT N/A 02/11/2020   Procedure: CORONARY ARTERY BYPASS GRAFTING (CABG) using LIMA to LAD; RIMA to PL; Endoscopic right greater saphenous vein harvest: SVC to Diag; SVC to OM1.;  Surgeon: Wonda Olds, MD;  Location: [place];  Service: Open Heart Surgery;  Laterality: N/A;  BILATERAL IMA  . CORONARY STENT INTERVENTION N/A 01/10/2018   Procedure: CORONARY STENT INTERVENTION;  Surgeon: Lorretta Harp, MD;  Location: [place] [facility];   Service: Cardiovascular;  Laterality: N/A;  . CORONARY/GRAFT ACUTE MI REVASCULARIZATION N/A 01/10/2018   Procedure: Coronary/Graft Acute MI Revascularization;  Surgeon: Lorretta Harp, MD;  Location: [place] [facility];  Service: Cardiovascular;  Laterality: N/A;  . EMBOLIZATION Left 03/19/2018  . EMBOLIZATION Left 03/19/2018   Procedure: EMBOLIZATION - Left Internal;  Surgeon: Conrad [place], MD;  Location: [place] [facility];  Service: Cardiovascular;  Laterality: Left;  . EMBOLIZATION Right 04/16/2018   Procedure: EMBOLIZATION;  Surgeon: Conrad [place], MD;  Location: [place] [facility];  Service: Cardiovascular;  Laterality: Right;  . ENDOVEIN HARVEST OF GREATER SAPHENOUS VEIN Right 02/11/2020   Procedure: [place] Of Greater Saphenous Vein using right lower extremity.;  Surgeon: Wonda Olds, MD;  Location: [facility] OR;  Service: Open Heart Surgery;  Laterality: Right;  . FEMORAL ARTERY EXPLORATION Right 05/07/2018   Procedure: FEMORAL ARTERY EXPLORATION, EXTENDED PROFUNDAPLASTY;  Surgeon: Conrad [place], MD;  Location: [place];  Service: Vascular;  Laterality: Right;  . INTRAOPERATIVE ARTERIOGRAM Right 05/07/2018   Procedure: INTRA OPERATIVE ARTERIOGRAM;  Surgeon: Conrad [place], MD;  Location: [place];  Service: Vascular;  Laterality: Right;  . LEFT HEART CATH AND CORONARY ANGIOGRAPHY N/A 01/10/2018   Procedure: LEFT HEART CATH AND CORONARY ANGIOGRAPHY;  Surgeon: Lorretta Harp, MD;  Location: [place] [facility];  Service: Cardiovascular;  Laterality: N/A;  . LEFT HEART CATH AND CORONARY ANGIOGRAPHY N/A 02/08/2020   Procedure: LEFT HEART CATH AND CORONARY ANGIOGRAPHY;  Surgeon: Troy Sine, MD;  Location: [place] [facility];  Service: Cardiovascular;  Laterality: N/A;  . PACEMAKER INSERTION  11/2005  . PACEMAKER LEAD REMOVAL N/A 10/17/2014   Procedure: PACEMAKER LEAD REMOVAL;  Surgeon: Evans Lance, MD;  Location: [place];  Service: Cardiovascular;  Laterality: N/A;  . RENAL ANGIOGRAPHY  Left 04/16/2018   Procedure: RENAL ANGIOGRAPHY;  Surgeon: Conrad [place], MD;  Location: [place] [facility];  Service: Cardiovascular;  Laterality: Left;  . s/p ICD placement      Medtronic Maximo #7232 single chamber  . TEE WITHOUT CARDIOVERSION N/A 02/11/2020   Procedure: TRANSESOPHAGEAL ECHOCARDIOGRAM (TEE);  Surgeon: Wonda Olds, MD;  Location: [place];  Service: Open Heart Surgery;  Laterality: N/A;     Current Outpatient Medications  Medication Sig Dispense Refill  . acetaminophen (TYLENOL) 500 MG tablet Take 1,000 mg by mouth every 6 (six) hours as needed (for pain.).    Marland Kitchen albuterol (VENTOLIN HFA) 108 (90 Base) MCG/ACT inhaler TAKE 2 PUFFS BY MOUTH EVERY 6 HOURS AS NEEDED FOR WHEEZE OR SHORTNESS OF BREATH *USE W/ SPACER* 8.5 g 0  . aspirin 81 MG chewable tablet Chew 1 tablet (81 mg total) by mouth daily.    Marland Kitchen atorvastatin (LIPITOR) 80  MG tablet TAKE 1 TABLET (80 MG TOTAL) BY MOUTH DAILY AT 6 PM. 30 tablet 11  . bacitracin ointment Apply 1 application topically 3 (three) times daily. 425 g 0  . clopidogrel (PLAVIX) 75 MG tablet Take 1 tablet (75 mg total) by mouth daily. 30 tablet 1  . glipiZIDE (GLUCOTROL) 5 MG tablet TAKE 1 TABLET (5 MG TOTAL) BY MOUTH 2 (TWO) TIMES DAILY BEFORE A MEAL. 180 tablet 3  . metFORMIN (GLUCOPHAGE-XR) 500 MG 24 hr tablet TAKE 2 TABLETS BY MOUTH EVERY DAY WITH BREAKFAST 180 tablet 3  . metoprolol tartrate (LOPRESSOR) 25 MG tablet Take 1 tablet (25 mg total) by mouth 2 (two) times daily. 180 tablet 3  . Multiple Vitamin (MULTIVITAMIN WITH MINERALS) TABS tablet Take 1 tablet by mouth daily.    . pantoprazole (PROTONIX) 40 MG tablet Take 1 tablet (40 mg total) by mouth at bedtime. 30 tablet 1  . SPIRIVA HANDIHALER 18 MCG inhalation capsule INHALE 1 CAPSULE VIA HANDIHALER ONCE DAILY AT THE SAME TIME EVERY DAY 30 capsule 11  . spironolactone (ALDACTONE) 25 MG tablet Take 1 tablet (25 mg total) by mouth daily. 30 tablet 11  . vitamin B-12 (CYANOCOBALAMIN) 1000 MCG  tablet Take 1,000 mcg by mouth daily.     Marland Kitchen zolpidem (AMBIEN) 5 MG tablet Take 1 tablet (5 mg total) by mouth at bedtime as needed for sleep. 30 tablet 0  . empagliflozin (JARDIANCE) 10 MG TABS tablet Take 1 tablet (10 mg total) by mouth daily. 30 tablet 11  . furosemide (LASIX) 20 MG tablet Take 1 tablet (20 mg total) by mouth daily. 90 tablet 3  . losartan (COZAAR) 25 MG tablet Take 1 tablet (25 mg total) by mouth daily. 90 tablet 3   No current facility-administered medications for this visit.    Allergies:   Patient has no known allergies.    ROS:  Please see the history of present illness.   Otherwise, review of systems are positive for none.   All other systems are reviewed and negative.    PHYSICAL EXAM: VS:  BP (!) 162/72   Pulse 70   Ht 5\' 10"  (1.778 m)   Wt 283 lb 3.2 oz (128.5 kg)   BMI 40.63 kg/m  , BMI Body mass index is 40.63 kg/m. GENERAL:  Well appearing NECK:  No jugular venous distention, waveform within normal limits, carotid upstroke brisk and symmetric, no bruits, no thyromegaly LUNGS:  Clear to auscultation bilaterally CHEST:  Well healed sternotomy scar. HEART:  PMI not displaced or sustained,S1 and S2 within normal limits, no S3, no S4, no clicks, no rubs, no murmurs ABD:  Flat, positive bowel sounds normal in frequency in pitch, no bruits, no rebound, no guarding, no midline pulsatile mass, no hepatomegaly, no splenomegaly EXT:  2 plus pulses throughout, no edema, no cyanosis no clubbing   EKG:  EKG is  ordered today. Sinus rhythm, rate 70, axis within normal limits, intervals within normal limits, no acute ST-T wave changes.   Recent Labs: 12/10/2019: ALT 27 02/15/2020: Magnesium 1.7 05/21/2020: Hemoglobin 13.0; Platelets 301 07/18/2020: Pro B Natriuretic peptide (BNP) 146.0 08/08/2020: BUN 15; Creatinine, Ser 1.03; Potassium 4.4; Sodium 137    Lipid Panel    Component Value Date/Time   CHOL 125 12/10/2019 1131   CHOL 156 08/25/2018 1154   TRIG  307.0 (H) 12/10/2019 1131   TRIG 575 (HH) 01/02/2007 0000   HDL 37.60 (L) 12/10/2019 1131   HDL 41 08/25/2018 1154  CHOLHDL 3 12/10/2019 1131   VLDL 61.4 (H) 12/10/2019 1131   LDLCALC 35 08/25/2018 1154   LDLDIRECT 26.0 12/10/2019 1131      Wt Readings from Last 3 Encounters:  09/25/20 283 lb 3.2 oz (128.5 kg)  08/08/20 285 lb (129.3 kg)  07/18/20 (!) 285 lb (129.3 kg)      Other studies Reviewed: Additional studies/ records that were reviewed today include: Labs Review of the above records demonstrates:  Please see elsewhere in the note.     ASSESSMENT AND PLAN:  CAD/CABG:    The patient has no new sypmtoms.  No further cardiovascular testing is indicated.    ATRIAL FIB: He has not had any symptomatic recurrence of this off amiodarone.  This was a postoperative arrhythmia.   CARDIOMYOPATHY: I am going to start him on Cozaar 25 mg daily for his cardiomyopathy and begin to med titrate.  He can slowly be started beta-blockers as his blood pressure is up.  He has now been started on spironolactone.  He needs a basic metabolic profile in 1 month.  I am going to reduce his Lasix to 20 mg as I am adding Jardiance.  HTN:   This is being managed in the context of treating his CHF  DYSLIPIDEMIA:   He will continue with high-dose statin.  He should come back fasting in 1 month for lipid profile.  MORBID OBESITY: I think that this and deconditioning contribute to his dyspnea.  We talked about weight loss.   DM: I am going to start empagliflozin given his A1c of 7.8.  I will slightly reduce his diuretic given the diuretic effect of this.  We talked about the in particular problems with urinary tract infection and skin infection that could occur.  ICD:   This was explanted.    OSA:   He is using CPAP.  No change in therapy.  COVID EDUCATION: He had Covid.  He has had his vaccines.     Current medicines are reviewed at length with the patient today.  The patient does not have  concerns regarding medicines.  The following changes have been made:  As above  Labs/ tests ordered today include: BMET one month, lipid profile fasting and AIC  Orders Placed This Encounter  Procedures  . Lipid panel  . Basic metabolic panel  . Hemoglobin A1c  . EKG 12-Lead     Disposition:   FU with APP in one month.     Signed, Minus Breeding, MD  09/25/2020 8:35 AM    [facility]

## 2020-09-22 NOTE — Progress Notes (Signed)
Full PFT performed today. °

## 2020-09-25 ENCOUNTER — Encounter: Payer: Self-pay | Admitting: Cardiology

## 2020-09-25 ENCOUNTER — Other Ambulatory Visit: Payer: Self-pay

## 2020-09-25 ENCOUNTER — Ambulatory Visit (INDEPENDENT_AMBULATORY_CARE_PROVIDER_SITE_OTHER): Payer: BC Managed Care – PPO | Admitting: Cardiology

## 2020-09-25 VITALS — BP 162/72 | HR 70 | Ht 70.0 in | Wt 283.2 lb

## 2020-09-25 DIAGNOSIS — I2510 Atherosclerotic heart disease of native coronary artery without angina pectoris: Secondary | ICD-10-CM

## 2020-09-25 DIAGNOSIS — E1140 Type 2 diabetes mellitus with diabetic neuropathy, unspecified: Secondary | ICD-10-CM | POA: Diagnosis not present

## 2020-09-25 DIAGNOSIS — I200 Unstable angina: Secondary | ICD-10-CM

## 2020-09-25 DIAGNOSIS — I480 Paroxysmal atrial fibrillation: Secondary | ICD-10-CM | POA: Diagnosis not present

## 2020-09-25 MED ORDER — EMPAGLIFLOZIN 10 MG PO TABS: 10.0000 mg | ORAL_TABLET | Freq: Every day | ORAL | 11 refills | Status: DC

## 2020-09-25 MED ORDER — LOSARTAN POTASSIUM 25 MG PO TABS: 25.0000 mg | ORAL_TABLET | Freq: Every day | ORAL | 3 refills | Status: DC

## 2020-09-25 MED ORDER — FUROSEMIDE 20 MG PO TABS: 20.0000 mg | ORAL_TABLET | Freq: Every day | ORAL | 3 refills | Status: DC

## 2020-09-25 NOTE — Patient Instructions (Signed)
Medication Instructions:   start taking Losartan 25 mg one tablet daily  decrease Furosemide 20 mg one tablet daily  Start taking Jardiance 10 mg one tablet daily    *If you need a refill on your cardiac medications before your next appointment, please call your pharmacy*   Lab Work: LIPId HgBA1C  BMP in one month before visit  If you have labs (blood work) drawn today and your tests are completely normal, you will receive your results only by: Marland Kitchen MyChart Message (if you have MyChart) OR . A paper copy in the mail If you have any lab test that is abnormal or we need to change your treatment, we will call you to review the results.   Testing/Procedures: Not needed  Follow-Up: At [facility], you and your health needs are our priority.  As part of our continuing mission to provide you with exceptional heart care, we have created designated Provider Care Teams.  These Care Teams include your primary Cardiologist (physician) and Advanced Practice Providers (APPs -  Physician Assistants and Nurse Practitioners) who all work together to provide you with the care you need, when you need it.  We recommend signing up for the patient portal called "MyChart".  Sign up information is provided on this After Visit Summary.  MyChart is used to connect with patients for Virtual Visits (Telemedicine).  Patients are able to view lab/test results, encounter notes, upcoming appointments, etc.  Non-urgent messages can be sent to your provider as well.   To learn more about what you can do with MyChart, go to [email or web address].    Your next appointment:   1 month(s)  The format for your next appointment:   In Person  Provider:   Rosaria Ferries, PA-C any NP,PA   Other Instructions

## 2020-10-07 ENCOUNTER — Other Ambulatory Visit: Payer: Self-pay | Admitting: Vascular Surgery

## 2020-10-17 DIAGNOSIS — I4819 Other persistent atrial fibrillation: Secondary | ICD-10-CM | POA: Diagnosis not present

## 2020-11-01 NOTE — Progress Notes (Signed)
Cardiology Office Note:    Date:  11/08/2020   ID:  [name], DOB [birth year], MRN [id number]  PCP:  Venia Carbon, MD  Cardiologist:  Minus Breeding, MD   Referring MD: Venia Carbon, MD   Chief Complaint  Patient presents with  . Follow-up    CAD, chest pain    History of Present Illness:    [name] is a 64 y.o. male with a hx of CAD, HTN, DM, COPD, HLD, PVD, OSA, and tobacco abuse. CAD history starting in 2012 with heart cath demonstrating 60-70% LAD, 50% D2, 75-80% Cx. EF at that time was 35%. He had ICD placed. At [facility], Dr. Lovena Le elected not to replace his ICD. He had inferior MI 12/2017 treated with DES to RCA. He also had occluded left Cx. EF was 40-45%. Residual LAD disease was noted and plan was for OP stress test before proceeding with PCI of LAD. AAA repair 04/2018. COVID-19 infection 12/21/19. Feb 2021 he presented with chest pain and found to have 3v disease on heart cath. He ultimately underwent CABG x 4 by Dr. Orvan Seen. He was last seen by Dr. Percival Spanish 09/25/20 post CABG. At that visit, he was unsure of what medications he took regularly. Dr. Percival Spanish added losartan 25 mg to his spironolactone; he added jardiance and reduced his lasix to 20 mg.  He had not yet started BB. Recent lipid profile revealed elevated triglycerides and LDL at goal. Weight loss and reduction in carbohydrates/saturated fats was recommended.  He presents today for follow up. He does report chest pain over the weekend. CP woke him from sleep Sunday morning. He had CP all day on Sunday, Sunday night, and Monday. He finally had relief of chest pain on Tues. The CP was constant, and radiated down his left arm at times. He took tylenol 2 g x 2 occasions which took the pain away. He also reports some pain with lifting his left arm. He denies muscle strain or injury. He denies chest pain with exertion, although he is not very active. No CP with rolling trash to curb or walking to his barn.  No new  activities. The CP has resolved, but he now has pain when he is resting on his left side, must sleep on his right side. CP located in his left chest just under his pectoral muscle - this is the same pain he had when he had his last MI. He does not have access to nitro SL tablets. He does report he almost wen to to the ER Sunday night. He also states the losartan makes him sweat and excessively tired. He believes he had a similar reaction the last time he took losartan. Dr. Percival Spanish started losartan and jardiance last visit. He also reports dyspnea. He sees pulmonology tomorrow.    Past Medical History:  Diagnosis Date  . Arthritis   . Automatic implantable cardioverter-defibrillator in situ   . COPD (chronic obstructive pulmonary disease) ([place])    emphysema by CXR  . Coronary artery disease   . Diabetes mellitus    type 2  no meds  . Diverticulosis of colon   . GERD (gastroesophageal reflux disease)    with esophagitis  . History of colonic polyps   . Hypertension   . Non-ischemic cardiomyopathy ([place])   . Presence of permanent cardiac pacemaker   . PVD (peripheral vascular disease) ([place])    bilateral common iliac artery aneurysms. Right SFA occlusion over a long segment. Left SFA  disease with occlusion of the left TP trunk. Artirogram Oct. 2006  . Sleep apnea   . Tobacco abuse   . Urinary incontinence    detrussor instability    Past Surgical History:  Procedure Laterality Date  . ABDOMINAL AORTIC ENDOVASCULAR STENT GRAFT N/A 05/07/2018   Procedure: ABDOMINAL AORTIC ENDOVASCULAR STENT GRAFT;  Surgeon: Conrad East Freehold, MD;  Location: [place];  Service: Vascular;  Laterality: N/A;  . CARDIAC CATHETERIZATION  06/2004   negative  . CARDIAC CATHETERIZATION  02/08/2020  . CLIPPING OF ATRIAL APPENDAGE N/A 02/11/2020   Procedure: Clipping Of Atrial Appendage using AtriCure 35 MM AtriClip.;  Surgeon: Wonda Olds, MD;  [facility];  Service: Open Heart Surgery;  Laterality: N/A;  .  COLONOSCOPY  04/2005  . COPD exacerbation    . CORONARY ANGIOPLASTY  01/09/2018  . CORONARY ARTERY BYPASS GRAFT N/A 02/11/2020   Procedure: CORONARY ARTERY BYPASS GRAFTING (CABG) using LIMA to LAD; RIMA to PL; Endoscopic right greater saphenous vein harvest: SVC to Diag; SVC to OM1.;  Surgeon: Wonda Olds, MD;  Location: [place];  Service: Open Heart Surgery;  Laterality: N/A;  BILATERAL IMA  . CORONARY STENT INTERVENTION N/A 01/10/2018   Procedure: CORONARY STENT INTERVENTION;  Surgeon: Lorretta Harp, MD;  Location: [place] [facility];  Service: Cardiovascular;  Laterality: N/A;  . CORONARY/GRAFT ACUTE MI REVASCULARIZATION N/A 01/10/2018   Procedure: Coronary/Graft Acute MI Revascularization;  Surgeon: Lorretta Harp, MD;  Location: [place] [facility];  Service: Cardiovascular;  Laterality: N/A;  . EMBOLIZATION Left 03/19/2018  . EMBOLIZATION Left 03/19/2018   Procedure: EMBOLIZATION - Left Internal;  Surgeon: Conrad Elgin, MD;  Location: [place] [facility];  Service: Cardiovascular;  Laterality: Left;  . EMBOLIZATION Right 04/16/2018   Procedure: EMBOLIZATION;  Surgeon: Conrad Shishmaref, MD;  Location: [place] [facility];  Service: Cardiovascular;  Laterality: Right;  . ENDOVEIN HARVEST OF GREATER SAPHENOUS VEIN Right 02/11/2020   Procedure: [place] Of Greater Saphenous Vein using right lower extremity.;  Surgeon: Wonda Olds, MD;  Location[facility];  Service: Open Heart Surgery;  Laterality: Right;  . FEMORAL ARTERY EXPLORATION Right 05/07/2018   Procedure: FEMORAL ARTERY EXPLORATION, EXTENDED PROFUNDAPLASTY;  Surgeon: Conrad [place], MD;  Location: [place];  Service: Vascular;  Laterality: Right;  . INTRAOPERATIVE ARTERIOGRAM Right 05/07/2018   Procedure: INTRA OPERATIVE ARTERIOGRAM;  Surgeon: Conrad Richland, MD;  Location: [place];  Service: Vascular;  Laterality: Right;  . LEFT HEART CATH AND CORONARY ANGIOGRAPHY N/A 01/10/2018   Procedure: LEFT HEART CATH AND CORONARY ANGIOGRAPHY;   Surgeon: Lorretta Harp, MD;  Location: [place] [facility];  Service: Cardiovascular;  Laterality: N/A;  . LEFT HEART CATH AND CORONARY ANGIOGRAPHY N/A 02/08/2020   Procedure: LEFT HEART CATH AND CORONARY ANGIOGRAPHY;  Surgeon: Troy Sine, MD;  Location: [place] [facility];  Service: Cardiovascular;  Laterality: N/A;  . PACEMAKER INSERTION  11/2005  . PACEMAKER LEAD REMOVAL N/A 10/17/2014   Procedure: PACEMAKER LEAD REMOVAL;  Surgeon: Evans Lance, MD;  Location: [place];  Service: Cardiovascular;  Laterality: N/A;  . RENAL ANGIOGRAPHY Left 04/16/2018   Procedure: RENAL ANGIOGRAPHY;  Surgeon: Conrad Leipsic, MD;  Location: [place] [facility];  Service: Cardiovascular;  Laterality: Left;  . s/p ICD placement      Medtronic Maximo #7232 single chamber  . TEE WITHOUT CARDIOVERSION N/A 02/11/2020   Procedure: TRANSESOPHAGEAL ECHOCARDIOGRAM (TEE);  Surgeon: Wonda Olds, MD;  [facility];  Service: Open Heart Surgery;  Laterality: N/A;    Current Medications: Current Meds  Medication Sig  . acetaminophen (TYLENOL) 500 MG tablet Take 1,000 mg by mouth every 6 (six) hours as needed (for pain.).  Marland Kitchen albuterol (VENTOLIN HFA) 108 (90 Base) MCG/ACT inhaler TAKE 2 PUFFS BY MOUTH EVERY 6 HOURS AS NEEDED FOR WHEEZE OR SHORTNESS OF BREATH *USE W/ SPACER*  . aspirin 81 MG chewable tablet Chew 1 tablet (81 mg total) by mouth daily.  Marland Kitchen atorvastatin (LIPITOR) 80 MG tablet TAKE 1 TABLET (80 MG TOTAL) BY MOUTH DAILY AT 6 PM.  . clopidogrel (PLAVIX) 75 MG tablet TAKE 1 TABLET BY MOUTH EVERY DAY  . empagliflozin (JARDIANCE) 10 MG TABS tablet Take 1 tablet (10 mg total) by mouth daily.  . furosemide (LASIX) 20 MG tablet Take 1 tablet (20 mg total) by mouth daily.  Marland Kitchen glipiZIDE (GLUCOTROL) 5 MG tablet TAKE 1 TABLET (5 MG TOTAL) BY MOUTH 2 (TWO) TIMES DAILY BEFORE A MEAL.  . metFORMIN (GLUCOPHAGE-XR) 500 MG 24 hr tablet TAKE 2 TABLETS BY MOUTH EVERY DAY WITH BREAKFAST  . metoprolol tartrate (LOPRESSOR) 25  MG tablet Take 1 tablet (25 mg total) by mouth 2 (two) times daily.  . Multiple Vitamin (MULTIVITAMIN WITH MINERALS) TABS tablet Take 1 tablet by mouth daily.  . pantoprazole (PROTONIX) 40 MG tablet Take 1 tablet (40 mg total) by mouth at bedtime.  Marland Kitchen SPIRIVA HANDIHALER 18 MCG inhalation capsule INHALE 1 CAPSULE VIA HANDIHALER ONCE DAILY AT THE SAME TIME EVERY DAY  . spironolactone (ALDACTONE) 25 MG tablet Take 1 tablet (25 mg total) by mouth daily.  . vitamin B-12 (CYANOCOBALAMIN) 1000 MCG tablet Take 1,000 mcg by mouth daily.   Marland Kitchen zolpidem (AMBIEN) 5 MG tablet Take 1 tablet (5 mg total) by mouth at bedtime as needed for sleep.  . [DISCONTINUED] losartan (COZAAR) 25 MG tablet Take 1 tablet (25 mg total) by mouth daily.     Allergies:   Patient has no known allergies.   Social History   Socioeconomic History  . Marital status: Married    Spouse name: Not on file  . Number of children: 2  . Years of education: Not on file  . Highest education level: Not on file  Occupational History  . Occupation: Grave digger--now disabled  Tobacco Use  . Smoking status: Former Smoker    Packs/day: 2.00    Years: 15.00    Pack years: 30.00    Types: Cigarettes    Quit date: 01/09/2018    Years since quitting: 2.8  . Smokeless tobacco: Never Used  . Tobacco comment: GAVE 1-800-QUIT-NOW  Vaping Use  . Vaping Use: Never used  Substance and Sexual Activity  . Alcohol use: No  . Drug use: No  . Sexual activity: Not Currently  Other Topics Concern  . Not on file  Social History Narrative   No living will   Requests wife as health care POA   Would accept resuscitation but doesn't want prolonged ventilation   No tube feeds if cognitively unaware   Social Determinants of Health   Financial Resource Strain:   . Difficulty of Paying Living Expenses: Not on file  Food Insecurity:   . Worried About Charity fundraiser in the Last Year: Not on file  . Ran Out of Food in the Last Year: Not on file    Transportation Needs:   . Lack of Transportation (Medical): Not on file  . Lack  of Transportation (Non-Medical): Not on file  Physical Activity:   . Days of Exercise per Week: Not on file  . Minutes of Exercise per Session: Not on file  Stress:   . Feeling of Stress : Not on file  Social Connections:   . Frequency of Communication with Friends and Family: Not on file  . Frequency of Social Gatherings with Friends and Family: Not on file  . Attends Religious Services: Not on file  . Active Member of Clubs or Organizations: Not on file  . Attends Archivist Meetings: Not on file  . Marital Status: Not on file     Family History: The patient's family history includes Cancer in his brother and sister; Coronary artery disease in his maternal aunt; Heart failure in his maternal aunt; Lung cancer in his maternal aunt and maternal uncle; Stroke in his father.  ROS:   Please see the history of present illness.     All other systems reviewed and are negative.  EKGs/Labs/Other Studies Reviewed:    The following studies were reviewed today:  Echo 02/08/20: 1. Left ventricular ejection fraction, by estimation, is 45 to 50%. The  left ventricle has mildly decreased function. The left ventricle  demonstrates global hypokinesis. The left ventricular internal cavity size  was moderately dilated. There is moderate  left ventricular hypertrophy. Left ventricular diastolic parameters are  consistent with Grade I diastolic dysfunction (impaired relaxation).  2. Right ventricular systolic function is normal. The right ventricular  size is normal.  3. The mitral valve is normal in structure and function. No evidence of  mitral valve regurgitation. No evidence of mitral stenosis.  4. The aortic valve is normal in structure and function. Aortic valve  regurgitation is not visualized. No aortic stenosis is present.  5. The inferior vena cava is normal in size with greater than 50%   respiratory variability, suggesting right atrial pressure of 3 mmHg.   EKG:  EKG is ordered today.  The ekg ordered today demonstrates sinus rhythm HR 79 - TWI inferior leads and V4/5/6  Recent Labs: 12/10/2019: ALT 27 02/15/2020: Magnesium 1.7 05/21/2020: Hemoglobin 13.0; Platelets 301 07/18/2020: Pro B Natriuretic peptide (BNP) 146.0 11/03/2020: BUN 11; Creatinine, Ser 0.98; Potassium 5.4; Sodium 137  Recent Lipid Panel    Component Value Date/Time   CHOL 139 11/03/2020 0858   TRIG 333 (H) 11/03/2020 0858   TRIG 575 (HH) 01/02/2007 0000   HDL 37 (L) 11/03/2020 0858   CHOLHDL 3.8 11/03/2020 0858   CHOLHDL 3 12/10/2019 1131   VLDL 61.4 (H) 12/10/2019 1131   LDLCALC 51 11/03/2020 0858   LDLDIRECT 26.0 12/10/2019 1131    Physical Exam:    VS:  BP 128/86   Pulse 72   Ht 5\' 11"  (1.803 m)   Wt 280 lb 6.4 oz (127.2 kg)   SpO2 97%   BMI 39.11 kg/m     Wt Readings from Last 3 Encounters:  11/08/20 280 lb 6.4 oz (127.2 kg)  11/08/20 281 lb (127.5 kg)  09/25/20 283 lb 3.2 oz (128.5 kg)     GEN: obese male in no acute distress HEENT: Normal NECK: No JVD; No carotid bruits LYMPHATICS: No lymphadenopathy CARDIAC: RRR, no murmurs, rubs, gallops RESPIRATORY:  Clear to auscultation without rales, wheezing or rhonchi  ABDOMEN: Soft, non-tender, non-distended MUSCULOSKELETAL:  No edema; No deformity  SKIN: Warm and dry NEUROLOGIC:  Alert and oriented x 3 PSYCHIATRIC:  Normal affect   ASSESSMENT:    1. Unstable  angina ([place])   2. Coronary artery disease involving native coronary artery of native heart without angina pectoris   3. Hx of CABG   4. Essential hypertension   5. Type 2 diabetes mellitus with diabetic neuropathy, without long-term current use of insulin ([place])   6. Dyslipidemia   7. Morbid obesity ([place])   8. Chronic systolic heart failure (H[facility])    PLAN:    In order of problems listed above:  Chest pain - he describes both typical and atypical symptoms, but I am  concerned about angina given his history - I discussed repeating a stress test - he initially declined; however, I explained EKG changes and he is amenable to lexiscan myoview - I will also titrate medications as below: - I will refill his nitro and will start 30 mg imdur at night - he is not tolerating losartan - I will switch this to 2.5 mg lisinopril (low dose lisinopril for pressure room for imdur) - we had a long discussion about ER precautions and to call our on-call line if symptoms recur - I have recommended close follow up in 2 weeks   CAD s/p CABG x 4 - continue ASA and plavix - symptoms concerning for angina - medication changes above    Chronic systolic hear failure Cardiomyopathy, ICD explanted - losartan, spiro, jardiance, lasix, BB - he thinks he is having side effects to the losartan - I will stop losartan and start 2.5 mg lisinopril - I will also start 30 mg imdur - appear euvolemic today - no c/o orthopnea, LE edema   Hyperlipidemia 12/10/2019: VLDL 61.4 11/03/2020: Cholesterol, Total 139; HDL 37; LDL Chol Calc (NIH) 51; Triglycerides 333 - advised weight loss and increased activity, reduce simple carbohydrates, no fried foods, limit saturated fats - continue lipitor 80 mg   DM - A1c was 7.8% 09/25/20 --> 7.7% today - continue jardiance, metformin, and glipizide    Follow up in 2 weeks   Medication Adjustments/Labs and Tests Ordered: Current medicines are reviewed at length with the patient today.  Concerns regarding medicines are outlined above.  Orders Placed This Encounter  Procedures  . Basic metabolic panel  . MYOCARDIAL PERFUSION IMAGING  . EKG 12-Lead   Meds ordered this encounter  Medications  . lisinopril (ZESTRIL) 2.5 MG tablet    Sig: Take 1 tablet (2.5 mg total) by mouth daily.    Dispense:  90 tablet    Refill:  0  . isosorbide mononitrate (IMDUR) 30 MG 24 hr tablet    Sig: Take 1 Tablet in the Evening    Dispense:  90 tablet     Refill:  3  . nitroGLYCERIN (NITROSTAT) 0.4 MG SL tablet    Sig: Place 1 tablet (0.4 mg total) under the tongue every 5 (five) minutes as needed for chest pain.    Dispense:  25 tablet    Refill:  3    Signed, Ledora Bottcher, [place]  11/08/2020 3:55 PM    [facility]

## 2020-11-03 DIAGNOSIS — E1140 Type 2 diabetes mellitus with diabetic neuropathy, unspecified: Secondary | ICD-10-CM | POA: Diagnosis not present

## 2020-11-03 DIAGNOSIS — I2510 Atherosclerotic heart disease of native coronary artery without angina pectoris: Secondary | ICD-10-CM | POA: Diagnosis not present

## 2020-11-03 DIAGNOSIS — I480 Paroxysmal atrial fibrillation: Secondary | ICD-10-CM | POA: Diagnosis not present

## 2020-11-06 LAB — LIPID PANEL
Chol/HDL Ratio: 3.8 ratio (ref 0.0–5.0)
Cholesterol, Total: 139 mg/dL (ref 100–199)
HDL: 37 mg/dL — ABNORMAL LOW (ref >39)
LDL Chol Calc (NIH): 51 mg/dL (ref 0–99)
Triglycerides: 333 mg/dL — ABNORMAL HIGH (ref 0–149)
VLDL Cholesterol Cal: 51 mg/dL — ABNORMAL HIGH (ref 5–40)

## 2020-11-06 LAB — BASIC METABOLIC PANEL
BUN/Creatinine Ratio: 11 (ref 10–24)
BUN: 11 mg/dL (ref 8–27)
CO2: 26 mmol/L (ref 20–29)
Calcium: 9.6 mg/dL (ref 8.6–10.2)
Chloride: 100 mmol/L (ref 96–106)
Creatinine, Ser: 0.98 mg/dL (ref 0.76–1.27)
GFR calc Af Amer: 94 mL/min/{1.73_m2} (ref >59)
GFR calc non Af Amer: 82 mL/min/{1.73_m2} (ref >59)
Glucose: 142 mg/dL — ABNORMAL HIGH (ref 65–99)
Potassium: 5.4 mmol/L — ABNORMAL HIGH (ref 3.5–5.2)
Sodium: 137 mmol/L (ref 134–144)

## 2020-11-06 LAB — HEMOGLOBIN A1C
Est. average glucose Bld gHb Est-mCnc: 183 mg/dL
Hgb A1c MFr Bld: 8 % — ABNORMAL HIGH (ref 4.8–5.6)

## 2020-11-07 ENCOUNTER — Telehealth: Payer: Self-pay | Admitting: *Deleted

## 2020-11-07 DIAGNOSIS — E875 Hyperkalemia: Secondary | ICD-10-CM

## 2020-11-07 DIAGNOSIS — I10 Essential (primary) hypertension: Secondary | ICD-10-CM

## 2020-11-07 NOTE — Telephone Encounter (Signed)
Released in my chart with Dr Hochrein's comments attached and sent to PCP  Mailed lab order sheets

## 2020-11-07 NOTE — Telephone Encounter (Signed)
-----   Message from Minus Breeding, MD sent at 11/04/2020  1:45 PM EST ----- Triglycerides are elevated but LDL is OK.  Dietary management should focus on attaining and maintaining a healthy weight and reduction of intake of simple carbohydrates, (<6 percent calories of added sugar and 30 to 35 percent calories of total fat.)  Dietary fat is not a primary source for liver TG, and higher-fat diets do not raise fasting plasma TG levels in most people. Nevertheless, a change in the types of fat (ie, reducing saturated versus poly- and monounsaturated fats) is recommended . I suggest increased consumption of fish that contain high amounts of omega-3 fatty acids (baked not fried.)    Potassium is mildly elevated and he needs to have this repeated in two weeks with a repeat BMET.

## 2020-11-08 ENCOUNTER — Encounter: Payer: Self-pay | Admitting: Cardiology

## 2020-11-08 ENCOUNTER — Encounter: Payer: Self-pay | Admitting: Physician Assistant

## 2020-11-08 ENCOUNTER — Other Ambulatory Visit: Payer: Self-pay

## 2020-11-08 ENCOUNTER — Ambulatory Visit (INDEPENDENT_AMBULATORY_CARE_PROVIDER_SITE_OTHER): Payer: BC Managed Care – PPO | Admitting: Physician Assistant

## 2020-11-08 ENCOUNTER — Encounter: Payer: Self-pay | Admitting: Internal Medicine

## 2020-11-08 ENCOUNTER — Ambulatory Visit (INDEPENDENT_AMBULATORY_CARE_PROVIDER_SITE_OTHER): Payer: BC Managed Care – PPO | Admitting: Internal Medicine

## 2020-11-08 VITALS — BP 128/86 | HR 72 | Ht 71.0 in | Wt 280.4 lb

## 2020-11-08 VITALS — BP 116/70 | HR 80 | Temp 97.5°F | Ht 70.0 in | Wt 281.0 lb

## 2020-11-08 DIAGNOSIS — I5022 Chronic systolic (congestive) heart failure: Secondary | ICD-10-CM

## 2020-11-08 DIAGNOSIS — I200 Unstable angina: Secondary | ICD-10-CM | POA: Diagnosis not present

## 2020-11-08 DIAGNOSIS — E1151 Type 2 diabetes mellitus with diabetic peripheral angiopathy without gangrene: Secondary | ICD-10-CM

## 2020-11-08 DIAGNOSIS — I10 Essential (primary) hypertension: Secondary | ICD-10-CM

## 2020-11-08 DIAGNOSIS — R079 Chest pain, unspecified: Secondary | ICD-10-CM

## 2020-11-08 DIAGNOSIS — Z951 Presence of aortocoronary bypass graft: Secondary | ICD-10-CM | POA: Diagnosis not present

## 2020-11-08 DIAGNOSIS — I2510 Atherosclerotic heart disease of native coronary artery without angina pectoris: Secondary | ICD-10-CM | POA: Diagnosis not present

## 2020-11-08 DIAGNOSIS — Z23 Encounter for immunization: Secondary | ICD-10-CM | POA: Diagnosis not present

## 2020-11-08 DIAGNOSIS — E1140 Type 2 diabetes mellitus with diabetic neuropathy, unspecified: Secondary | ICD-10-CM | POA: Diagnosis not present

## 2020-11-08 DIAGNOSIS — E785 Hyperlipidemia, unspecified: Secondary | ICD-10-CM

## 2020-11-08 DIAGNOSIS — J439 Emphysema, unspecified: Secondary | ICD-10-CM | POA: Diagnosis not present

## 2020-11-08 LAB — POCT GLYCOSYLATED HEMOGLOBIN (HGB A1C): Hemoglobin A1C: 7.7 % — AB (ref 4.0–5.6)

## 2020-11-08 LAB — HM DIABETES FOOT EXAM

## 2020-11-08 MED ORDER — LISINOPRIL 2.5 MG PO TABS: 2.5000 mg | ORAL_TABLET | Freq: Every day | ORAL | 0 refills | Status: DC

## 2020-11-08 MED ORDER — ISOSORBIDE MONONITRATE ER 30 MG PO TB24: ORAL_TABLET | ORAL | 3 refills | Status: DC

## 2020-11-08 MED ORDER — NITROGLYCERIN 0.4 MG SL SUBL: 0.4000 mg | SUBLINGUAL_TABLET | SUBLINGUAL | 3 refills | Status: DC | PRN

## 2020-11-08 NOTE — Patient Instructions (Addendum)
Medication Instructions:  Stop Losartan. Start Lisinopril 2.5 mg (1 Tablet Daily in the Morning). Start Imdur 30 mg (1 Tablet in the Evening)  *If you need a refill on your cardiac medications before your next appointment, please call your pharmacy*   Lab Work: BMP If you have labs (blood work) drawn today and your tests are completely normal, you will receive your results only by: Marland Kitchen MyChart Message (if you have MyChart) OR . A paper copy in the mail If you have any lab test that is abnormal or we need to change your treatment, we will call you to review the results.   Testing/Procedures: [address], [address] Your physician has requested that you have en exercise stress myoview. For further information please visit [email or web address]. Please follow instruction sheet, as given.    Follow-Up: At [facility], you and your health needs are our priority.  As part of our continuing mission to provide you with exceptional heart care, we have created designated Provider Care Teams.  These Care Teams include your primary Cardiologist (physician) and Advanced Practice Providers (APPs -  Physician Assistants and Nurse Practitioners) who all work together to provide you with the care you need, when you need it.     Your next appointment:   2 weeks  The format for your next appointment:   In Person  Provider:   Shelva Majestic, MD

## 2020-11-08 NOTE — Assessment & Plan Note (Signed)
Will see Dr Halford Chessman tomorrow He doesn't think the spiriva has helped

## 2020-11-08 NOTE — Assessment & Plan Note (Signed)
Lab Results  Component Value Date   HGBA1C 7.7 (A) 11/08/2020   May still improve more as only recently started the empaglifozin Still on glipzide and metformin Neuropathy not bad enough for medication

## 2020-11-08 NOTE — Assessment & Plan Note (Signed)
Not clearly exacerbated Not clear how much this is causing the DOE, etc On furosemide (reduced dose) and low dose aldactone

## 2020-11-08 NOTE — Progress Notes (Signed)
Subjective:    Patient ID: [name], male    DOB: [birth year], 64 y.o.   MRN: [id number]  HPI Here for follow up of diabetes and breathing problems This visit occurred during the SARS-CoV-2 public health emergency.  Safety protocols were in place, including screening questions prior to the visit, additional usage of staff PPE, and extensive cleaning of exam room while observing appropriate contact time as indicated for disinfecting solutions.   Still has the same SOB Was started on jardiance and losartan by Dr Percival Spanish Thinks one of these is causing increased sweating and sleeping more--but still taking it (suggested trying it at night) Furosemide dose cut Does note increased urination after the jardiance  Did have PFTs Will be seeing Dr Halford Chessman tomorrow Still gets DOE---even at times just walking in the house Sleeps in electric bed--keeps HOB elevated. Frequent awakening--despite CPAP (not clearly PND)  Not checking sugars Same numbness in hands and feet. Rare toe pain--like needles sticking in them  Current Outpatient Medications on File Prior to Visit  Medication Sig Dispense Refill  . acetaminophen (TYLENOL) 500 MG tablet Take 1,000 mg by mouth every 6 (six) hours as needed (for pain.).    Marland Kitchen albuterol (VENTOLIN HFA) 108 (90 Base) MCG/ACT inhaler TAKE 2 PUFFS BY MOUTH EVERY 6 HOURS AS NEEDED FOR WHEEZE OR SHORTNESS OF BREATH *USE W/ SPACER* 8.5 g 0  . aspirin 81 MG chewable tablet Chew 1 tablet (81 mg total) by mouth daily.    Marland Kitchen atorvastatin (LIPITOR) 80 MG tablet TAKE 1 TABLET (80 MG TOTAL) BY MOUTH DAILY AT 6 PM. 30 tablet 11  . clopidogrel (PLAVIX) 75 MG tablet TAKE 1 TABLET BY MOUTH EVERY DAY 90 tablet 3  . empagliflozin (JARDIANCE) 10 MG TABS tablet Take 1 tablet (10 mg total) by mouth daily. 30 tablet 11  . furosemide (LASIX) 20 MG tablet Take 1 tablet (20 mg total) by mouth daily. 90 tablet 3  . glipiZIDE (GLUCOTROL) 5 MG tablet TAKE 1 TABLET (5 MG TOTAL) BY MOUTH 2 (TWO)  TIMES DAILY BEFORE A MEAL. 180 tablet 3  . losartan (COZAAR) 25 MG tablet Take 1 tablet (25 mg total) by mouth daily. 90 tablet 3  . metFORMIN (GLUCOPHAGE-XR) 500 MG 24 hr tablet TAKE 2 TABLETS BY MOUTH EVERY DAY WITH BREAKFAST 180 tablet 3  . metoprolol tartrate (LOPRESSOR) 25 MG tablet Take 1 tablet (25 mg total) by mouth 2 (two) times daily. 180 tablet 3  . Multiple Vitamin (MULTIVITAMIN WITH MINERALS) TABS tablet Take 1 tablet by mouth daily.    . pantoprazole (PROTONIX) 40 MG tablet Take 1 tablet (40 mg total) by mouth at bedtime. 30 tablet 1  . SPIRIVA HANDIHALER 18 MCG inhalation capsule INHALE 1 CAPSULE VIA HANDIHALER ONCE DAILY AT THE SAME TIME EVERY DAY 30 capsule 11  . spironolactone (ALDACTONE) 25 MG tablet Take 1 tablet (25 mg total) by mouth daily. 30 tablet 11  . vitamin B-12 (CYANOCOBALAMIN) 1000 MCG tablet Take 1,000 mcg by mouth daily.     Marland Kitchen zolpidem (AMBIEN) 5 MG tablet Take 1 tablet (5 mg total) by mouth at bedtime as needed for sleep. 30 tablet 0   No current facility-administered medications on file prior to visit.    No Known Allergies  Past Medical History:  Diagnosis Date  . Arthritis   . Automatic implantable cardioverter-defibrillator in situ   . COPD (chronic obstructive pulmonary disease) ([place])    emphysema by CXR  . Coronary artery disease   .  Diabetes mellitus    type 2  no meds  . Diverticulosis of colon   . GERD (gastroesophageal reflux disease)    with esophagitis  . History of colonic polyps   . Hypertension   . Non-ischemic cardiomyopathy ([place])   . Presence of permanent cardiac pacemaker   . PVD (peripheral vascular disease) ([place])    bilateral common iliac artery aneurysms. Right SFA occlusion over a long segment. Left SFA disease with occlusion of the left TP trunk. Artirogram Oct. 2006  . Sleep apnea   . Tobacco abuse   . Urinary incontinence    detrussor instability    Past Surgical History:  Procedure Laterality Date  . ABDOMINAL  AORTIC ENDOVASCULAR STENT GRAFT N/A 05/07/2018   Procedure: ABDOMINAL AORTIC ENDOVASCULAR STENT GRAFT;  Surgeon: Conrad Placitas, MD;  Location: [facility];  Service: Vascular;  Laterality: N/A;  . CARDIAC CATHETERIZATION  06/2004   negative  . CARDIAC CATHETERIZATION  02/08/2020  . CLIPPING OF ATRIAL APPENDAGE N/A 02/11/2020   Procedure: Clipping Of Atrial Appendage using AtriCure 35 MM AtriClip.;  Surgeon: Wonda Olds, MD;  Location: [facility];  Service: Open Heart Surgery;  Laterality: N/A;  . COLONOSCOPY  04/2005  . COPD exacerbation    . CORONARY ANGIOPLASTY  01/09/2018  . CORONARY ARTERY BYPASS GRAFT N/A 02/11/2020   Procedure: CORONARY ARTERY BYPASS GRAFTING (CABG) using LIMA to LAD; RIMA to PL; Endoscopic right greater saphenous vein harvest: SVC to Diag; SVC to OM1.;  Surgeon: Wonda Olds, MD;  Location: [place];  Service: Open Heart Surgery;  Laterality: N/A;  BILATERAL IMA  . CORONARY STENT INTERVENTION N/A 01/10/2018   Procedure: CORONARY STENT INTERVENTION;  Surgeon: Lorretta Harp, MD;  Location: [facility];  Service: Cardiovascular;  Laterality: N/A;  . CORONARY/GRAFT ACUTE MI REVASCULARIZATION N/A 01/10/2018   Procedure: Coronary/Graft Acute MI Revascularization;  Surgeon: Lorretta Harp, MD;  Location: [facility] [place] [facility];  Service: Cardiovascular;  Laterality: N/A;  . EMBOLIZATION Left 03/19/2018  . EMBOLIZATION Left 03/19/2018   Procedure: EMBOLIZATION - Left Internal;  Surgeon: Conrad [place]eport, MD;  Location: [place] [facility];  Service: Cardiovascular;  Laterality: Left;  . EMBOLIZATION Right 04/16/2018   Procedure: EMBOLIZATION;  Surgeon: Conrad [place], MD;  Location: [place] [facility];  Service: Cardiovascular;  Laterality: Right;  . ENDOVEIN HARVEST OF GREATER SAPHENOUS VEIN Right 02/11/2020   Procedure: [place] Of Greater Saphenous Vein using right lower extremity.;  Surgeon: Wonda Olds, MD;  Location[facility];  Service: Open Heart Surgery;   Laterality: Right;  . FEMORAL ARTERY EXPLORATION Right 05/07/2018   Procedure: FEMORAL ARTERY EXPLORATION, EXTENDED PROFUNDAPLASTY;  Surgeon: Conrad Anthony, MD;  Location: [facility];  Service: Vascular;  Laterality: Right;  . INTRAOPERATIVE ARTERIOGRAM Right 05/07/2018   Procedure: INTRA OPERATIVE ARTERIOGRAM;  Surgeon: Conrad , MD;  Location: [place];  Service: Vascular;  Laterality: Right;  . LEFT HEART CATH AND CORONARY ANGIOGRAPHY N/A 01/10/2018   Procedure: LEFT HEART CATH AND CORONARY ANGIOGRAPHY;  Surgeon: Lorretta Harp, MD;  Location: [place] [facility];  Service: Cardiovascular;  Laterality: N/A;  . LEFT HEART CATH AND CORONARY ANGIOGRAPHY N/A 02/08/2020   Procedure: LEFT HEART CATH AND CORONARY ANGIOGRAPHY;  Surgeon: Troy Sine, MD;  Location: [place] [facility];  Service: Cardiovascular;  Laterality: N/A;  . PACEMAKER INSERTION  11/2005  . PACEMAKER LEAD REMOVAL N/A 10/17/2014   Procedure: PACEMAKER LEAD REMOVAL;  Surgeon: Champ Mungo  Lovena Le, MD;  Location: [place];  Service: Cardiovascular;  Laterality: N/A;  . RENAL ANGIOGRAPHY Left 04/16/2018   Procedure: RENAL ANGIOGRAPHY;  Surgeon: Conrad [place], MD;  Location: [place] [facility];  Service: Cardiovascular;  Laterality: Left;  . s/p ICD placement      Medtronic Maximo #7232 single chamber  . TEE WITHOUT CARDIOVERSION N/A 02/11/2020   Procedure: TRANSESOPHAGEAL ECHOCARDIOGRAM (TEE);  Surgeon: Wonda Olds, MD;  Location: [place];  Service: Open Heart Surgery;  Laterality: N/A;    Family History  Problem Relation Age of Onset  . Stroke Father   . Coronary artery disease Maternal Aunt   . Heart failure Maternal Aunt   . Lung cancer Maternal Aunt   . Lung cancer Maternal Uncle   . Cancer Brother        Mouth  . Cancer Sister        throat    Social History   Socioeconomic History  . Marital status: Married    Spouse name: Not on file  . Number of children: 2  . Years of education: Not on file  . Highest education  level: Not on file  Occupational History  . Occupation: Grave digger--now disabled  Tobacco Use  . Smoking status: Former Smoker    Packs/day: 2.00    Years: 15.00    Pack years: 30.00    Types: Cigarettes    Quit date: 01/09/2018    Years since quitting: 2.8  . Smokeless tobacco: Never Used  . Tobacco comment: GAVE 1-800-QUIT-NOW  Vaping Use  . Vaping Use: Never used  Substance and Sexual Activity  . Alcohol use: No  . Drug use: No  . Sexual activity: Not Currently  Other Topics Concern  . Not on file  Social History Narrative   No living will   Requests wife as health care POA   Would accept resuscitation but doesn't want prolonged ventilation   No tube feeds if cognitively unaware   Social Determinants of Health   Financial Resource Strain:   . Difficulty of Paying Living Expenses: Not on file  Food Insecurity:   . Worried About Charity fundraiser in the Last Year: Not on file  . Ran Out of Food in the Last Year: Not on file  Transportation Needs:   . Lack of Transportation (Medical): Not on file  . Lack of Transportation (Non-Medical): Not on file  Physical Activity:   . Days of Exercise per Week: Not on file  . Minutes of Exercise per Session: Not on file  Stress:   . Feeling of Stress : Not on file  Social Connections:   . Frequency of Communication with Friends and Family: Not on file  . Frequency of Social Gatherings with Friends and Family: Not on file  . Attends Religious Services: Not on file  . Active Member of Clubs or Organizations: Not on file  . Attends Archivist Meetings: Not on file  . Marital Status: Not on file  Intimate Partner Violence:   . Fear of Current or Ex-Partner: Not on file  . Emotionally Abused: Not on file  . Physically Abused: Not on file  . Sexually Abused: Not on file   Review of Systems  No PND No sig edema Does have some chest pain---doesn't have nitro any more Seems to be mostly if he lies on his left  side     Objective:   Physical Exam Constitutional:      Appearance:  Normal appearance.  Cardiovascular:     Rate and Rhythm: Regular rhythm. Bradycardia present.     Comments: Faint pedal pulses Pulmonary:     Effort: Pulmonary effort is normal.     Breath sounds: No wheezing or rales.     Comments: Decreased breath sounds but clear Musculoskeletal:     Cervical back: Neck supple.     Right lower leg: No edema.     Left lower leg: No edema.  Lymphadenopathy:     Cervical: No cervical adenopathy.  Skin:    Comments: No foot lesions  Neurological:     Mental Status: He is alert.     Comments: Decreased sensation in feet            Assessment & Plan:

## 2020-11-08 NOTE — Addendum Note (Signed)
Addended by: Pilar Grammes on: 11/08/2020 02:52 PM   Modules accepted: Orders

## 2020-11-08 NOTE — Addendum Note (Signed)
Addended by: Minette Brine on: 11/08/2020 04:34 PM   Modules accepted: Orders

## 2020-11-09 ENCOUNTER — Ambulatory Visit (INDEPENDENT_AMBULATORY_CARE_PROVIDER_SITE_OTHER): Payer: BC Managed Care – PPO | Admitting: Pulmonary Disease

## 2020-11-09 ENCOUNTER — Encounter: Payer: Self-pay | Admitting: Pulmonary Disease

## 2020-11-09 VITALS — BP 124/74 | HR 67 | Temp 98.0°F | Ht 71.0 in | Wt 280.2 lb

## 2020-11-09 DIAGNOSIS — R918 Other nonspecific abnormal finding of lung field: Secondary | ICD-10-CM

## 2020-11-09 DIAGNOSIS — J449 Chronic obstructive pulmonary disease, unspecified: Secondary | ICD-10-CM | POA: Diagnosis not present

## 2020-11-09 DIAGNOSIS — I200 Unstable angina: Secondary | ICD-10-CM

## 2020-11-09 DIAGNOSIS — G4733 Obstructive sleep apnea (adult) (pediatric): Secondary | ICD-10-CM

## 2020-11-09 LAB — BASIC METABOLIC PANEL
BUN/Creatinine Ratio: 12 (ref 10–24)
BUN: 13 mg/dL (ref 8–27)
CO2: 23 mmol/L (ref 20–29)
Calcium: 9.5 mg/dL (ref 8.6–10.2)
Chloride: 103 mmol/L (ref 96–106)
Creatinine, Ser: 1.08 mg/dL (ref 0.76–1.27)
GFR calc Af Amer: 84 mL/min/{1.73_m2} (ref >59)
GFR calc non Af Amer: 73 mL/min/{1.73_m2} (ref >59)
Glucose: 97 mg/dL (ref 65–99)
Potassium: 4.9 mmol/L (ref 3.5–5.2)
Sodium: 142 mmol/L (ref 134–144)

## 2020-11-09 MED ORDER — UMECLIDINIUM-VILANTEROL 62.5-25 MCG/INH IN AEPB: 1.0000 | INHALATION_SPRAY | Freq: Every day | RESPIRATORY_TRACT | Status: DC

## 2020-11-09 MED ORDER — UMECLIDINIUM-VILANTEROL 62.5-25 MCG/INH IN AEPB: 1.0000 | INHALATION_SPRAY | Freq: Every day | RESPIRATORY_TRACT | 5 refills | Status: DC

## 2020-11-09 NOTE — Patient Instructions (Signed)
Stop using spiriva  Start using anoro one puff daily  Albuterol two puffs every 4 to 6 hours as needed for cough, wheeze, chest congestion, or shortness of breath  Will arrange for CT chest in February 2022 and follow up after that

## 2020-11-09 NOTE — Progress Notes (Signed)
[facility]  Chief Complaint  Patient presents with  . Follow-up    Former OA patient-- PFT 09/22/2020. wearing cpap 10hr nightly- feels pressure and mask are okay. KTG:YBWLS    Constitutional:  BP 124/74 (BP Location: Left Arm, Cuff Size: Normal)   Pulse 67   Temp 98 F (36.7 C) (Temporal)   Ht 5\' 11"  (1.803 m)   Wt 280 lb 3.2 oz (127.1 kg)   SpO2 96%   BMI 39.08 kg/m   Past Medical History:  OA, COPD, CAD s/p CABG, non ischemic CM, s/p AICD, DM type 2, Diverticulosis, GERD, Colon polyps, HTN, PAD  Past Surgical History:  His  has a past surgical history that includes s/p ICD placement ; Colonoscopy (04/2005); Cardiac catheterization (06/2004); Pacemaker insertion (11/2005); COPD exacerbation; Pacemaker lead removal (N/A, 10/17/2014); Coronary/Graft Acute MI Revascularization (N/A, 01/10/2018); LEFT HEART CATH AND CORONARY ANGIOGRAPHY (N/A, 01/10/2018); CORONARY STENT INTERVENTION (N/A, 01/10/2018); Coronary angioplasty (01/09/2018); Embolization (Left, 03/19/2018); EMBOLIZATION (Left, 03/19/2018); EMBOLIZATION (Right, 04/16/2018); RENAL ANGIOGRAPHY (Left, 04/16/2018); Femoral artery debridement (Right, 05/07/2018); Intraoperative arteriogram (Right, 05/07/2018); Abdominal aortic endovascular stent graft (N/A, 05/07/2018); Cardiac catheterization (02/08/2020); LEFT HEART CATH AND CORONARY ANGIOGRAPHY (N/A, 02/08/2020); Coronary artery bypass graft (N/A, 02/11/2020); TEE without cardioversion (N/A, 02/11/2020); Clipping of atrial appendage (N/A, 02/11/2020); and Endoharvest vein of greater saphenous vein (Right, 02/11/2020).  Brief Summary:  [name] is a 64 y.o. male former smoker with obstructive sleep apnea and COPD.      Subjective:   He had sleep study in April.  Showed severe sleep apnea.  Had CPAP titration study in May.  Has been on CPAP 11 cm H2O.  Was having trouble with air leak and mouth dryness.  Better after he got new full face mask  few days ago.  He had PFT in October.  Showed moderate obstruction with air trapping.  Has been using spiriva, but doesn't feel it helps.  Uses albuterol several times per day and this helps.    Had CT chest in February that showed several lung nodules.  He is limited in exercise due to leg pains from PAD, low back pain, and dyspnea.  He is trying to do more activity.  Physical Exam:   Appearance - well kempt   ENMT - no sinus tenderness, no oral exudate, no LAN, Mallampati 3 airway, no stridor  Respiratory - equal breath sounds bilaterally, no wheezing or rales  CV - s1s2 regular rate and rhythm, no murmurs  Ext - no clubbing, no edema  Skin - no rashes  Psych - normal mood and affect   Pulmonary testing:   PFT 09/22/20 >> FEV1 2.61 (52%), FEV1% 58, TLC 6.53 (96%), RV 3.27 (145%), DLCO 114%  Chest Imaging:   CT chest 02/09/20 >> 5 nodule RUL, 3 mm nodule RML, fatty liver, atherosclerosis, calcifications in spleen  Sleep Tests:   PSG 04/02/20 >> AHI 59, SpO2 low 85%  CPAP titration 04/26/20 >> CPAP 11 cm H2O  CPAP 10/10/20 to 11/08/20 >> used on 28 of 30 nights with average 9 hrs 42 min.  Average AHI   Cardiac Tests:   Echo 02/08/20 >> EF 45 to 50%, mod LVH, grade 1 DD  Social History:  He  reports that he quit smoking about 2 years ago. His smoking use included cigarettes. He has a 90.00 pack-year smoking history. He has never used smokeless tobacco. He reports that he does not drink alcohol and does not use drugs.  Family History:  His family history includes Cancer in his brother and sister; Coronary artery disease in his maternal aunt; Heart failure in his maternal aunt; Lung cancer in his maternal aunt and maternal uncle; Stroke in his father.     Assessment/Plan:   COPD mixed type. - reviewed his PFT results - explained different roles of his inhalers - he doesn't feel that spiriva has been effective - will have him try anoro - continue prn  albuterol  Obstructive sleep apnea. - reviewed his sleep study results - he is compliant with CPAP and reports benefit - doing better now that he has better fitting mask - discussed how to adjust ramp setting on his CPAP machine - he uses [employer] for his DME - continue CPAP 11 cm H2O  Lung nodules with prior history of tobacco abuse. - will arrange for follow up CT chest w/o contrast for February 2022  Dyspnea. - likely related to deconditioning, COPD, CHF, and PAD - encouraged him to try maintaining a regular exercise regimen  Obesity. - he is aware of how his weight can impact his health  CAD s/p CABG, chronic systolic CHF. - followed by Dr. Percival Spanish with [facility]   Time Spent Involved in Patient Care on Day of Examination:  46 minutes  Follow up:  Patient Instructions  Stop using spiriva  Start using anoro one puff daily  Albuterol two puffs every 4 to 6 hours as needed for cough, wheeze, chest congestion, or shortness of breath  Will arrange for CT chest in February 2022 and follow up after that   Medication List:   Allergies as of 11/09/2020   No Known Allergies     Medication List       Accurate as of November 09, 2020 10:33 AM. If you have any questions, ask your nurse or doctor.        STOP taking these medications   Spiriva HandiHaler 18 MCG inhalation capsule Generic drug: tiotropium Stopped by: Chesley Mires, MD   zolpidem 5 MG tablet Commonly known as: AMBIEN Stopped by: Chesley Mires, MD     TAKE these medications   acetaminophen 500 MG tablet Commonly known as: TYLENOL Take 1,000 mg by mouth every 6 (six) hours as needed (for pain.).   albuterol 108 (90 Base) MCG/ACT inhaler Commonly known as: VENTOLIN HFA TAKE 2 PUFFS BY MOUTH EVERY 6 HOURS AS NEEDED FOR WHEEZE OR SHORTNESS OF BREATH *USE W/ SPACER*   aspirin 81 MG chewable tablet Chew 1 tablet (81 mg total) by mouth daily.   atorvastatin 80 MG tablet Commonly known as:  LIPITOR TAKE 1 TABLET (80 MG TOTAL) BY MOUTH DAILY AT 6 PM.   clopidogrel 75 MG tablet Commonly known as: PLAVIX TAKE 1 TABLET BY MOUTH EVERY DAY   empagliflozin 10 MG Tabs tablet Commonly known as: Jardiance Take 1 tablet (10 mg total) by mouth daily.   furosemide 20 MG tablet Commonly known as: Lasix Take 1 tablet (20 mg total) by mouth daily.   glipiZIDE 5 MG tablet Commonly known as: GLUCOTROL TAKE 1 TABLET (5 MG TOTAL) BY MOUTH 2 (TWO) TIMES DAILY BEFORE A MEAL.   isosorbide mononitrate 30 MG 24 hr tablet Commonly known as: IMDUR Take 1 Tablet in the Evening   lisinopril 2.5 MG tablet Commonly known as: ZESTRIL Take 1 tablet (2.5 mg total) by mouth daily.   metFORMIN 500 MG 24 hr tablet Commonly known as: GLUCOPHAGE-XR TAKE 2 TABLETS BY MOUTH EVERY DAY WITH  BREAKFAST   metoprolol tartrate 25 MG tablet Commonly known as: LOPRESSOR Take 1 tablet (25 mg total) by mouth 2 (two) times daily.   multivitamin with minerals Tabs tablet Take 1 tablet by mouth daily.   nitroGLYCERIN 0.4 MG SL tablet Commonly known as: NITROSTAT Place 1 tablet (0.4 mg total) under the tongue every 5 (five) minutes as needed for chest pain.   pantoprazole 40 MG tablet Commonly known as: PROTONIX Take 1 tablet (40 mg total) by mouth at bedtime.   spironolactone 25 MG tablet Commonly known as: ALDACTONE Take 1 tablet (25 mg total) by mouth daily.   umeclidinium-vilanterol 62.5-25 MCG/INH Aepb Commonly known as: ANORO ELLIPTA Inhale 1 puff into the lungs daily. Started by: Chesley Mires, MD   vitamin B-12 1000 MCG tablet Commonly known as: CYANOCOBALAMIN Take 1,000 mcg by mouth daily.       Signature:  Chesley Mires, MD [place] Pager - [phone] 11/09/2020, 10:33 AM

## 2020-11-14 ENCOUNTER — Other Ambulatory Visit: Payer: Self-pay | Admitting: Internal Medicine

## 2020-11-17 DIAGNOSIS — I4819 Other persistent atrial fibrillation: Secondary | ICD-10-CM | POA: Diagnosis not present

## 2020-11-20 DIAGNOSIS — I10 Essential (primary) hypertension: Secondary | ICD-10-CM | POA: Diagnosis not present

## 2020-11-20 DIAGNOSIS — H52213 Irregular astigmatism, bilateral: Secondary | ICD-10-CM | POA: Diagnosis not present

## 2020-11-20 DIAGNOSIS — E875 Hyperkalemia: Secondary | ICD-10-CM | POA: Diagnosis not present

## 2020-11-20 DIAGNOSIS — E119 Type 2 diabetes mellitus without complications: Secondary | ICD-10-CM | POA: Diagnosis not present

## 2020-11-20 DIAGNOSIS — H524 Presbyopia: Secondary | ICD-10-CM | POA: Diagnosis not present

## 2020-11-20 LAB — HM DIABETES EYE EXAM

## 2020-11-21 LAB — BASIC METABOLIC PANEL
BUN/Creatinine Ratio: 14 (ref 10–24)
BUN: 14 mg/dL (ref 8–27)
CO2: 20 mmol/L (ref 20–29)
Calcium: 9.5 mg/dL (ref 8.6–10.2)
Chloride: 100 mmol/L (ref 96–106)
Creatinine, Ser: 1 mg/dL (ref 0.76–1.27)
GFR calc Af Amer: 92 mL/min/{1.73_m2} (ref >59)
GFR calc non Af Amer: 79 mL/min/{1.73_m2} (ref >59)
Glucose: 184 mg/dL — ABNORMAL HIGH (ref 65–99)
Potassium: 5.4 mmol/L — ABNORMAL HIGH (ref 3.5–5.2)
Sodium: 141 mmol/L (ref 134–144)

## 2020-11-27 ENCOUNTER — Other Ambulatory Visit: Payer: Self-pay

## 2020-11-27 DIAGNOSIS — I5022 Chronic systolic (congestive) heart failure: Secondary | ICD-10-CM

## 2020-11-27 DIAGNOSIS — I10 Essential (primary) hypertension: Secondary | ICD-10-CM

## 2020-11-27 DIAGNOSIS — Z79899 Other long term (current) drug therapy: Secondary | ICD-10-CM

## 2020-11-28 ENCOUNTER — Telehealth (HOSPITAL_COMMUNITY): Payer: Self-pay

## 2020-11-28 NOTE — Telephone Encounter (Signed)
Spoke with the patient, detailed instructions given. He stated he understood would be here for his test. Asked to call back with any questions. S.Yarah Fuente EMTP

## 2020-11-29 ENCOUNTER — Encounter: Payer: Self-pay | Admitting: Cardiology

## 2020-11-29 ENCOUNTER — Other Ambulatory Visit: Payer: Self-pay

## 2020-11-29 ENCOUNTER — Ambulatory Visit (INDEPENDENT_AMBULATORY_CARE_PROVIDER_SITE_OTHER): Payer: BC Managed Care – PPO | Admitting: Cardiology

## 2020-11-29 VITALS — BP 112/70 | HR 77 | Ht 70.0 in | Wt 280.8 lb

## 2020-11-29 DIAGNOSIS — R5383 Other fatigue: Secondary | ICD-10-CM | POA: Insufficient documentation

## 2020-11-29 DIAGNOSIS — I5022 Chronic systolic (congestive) heart failure: Secondary | ICD-10-CM | POA: Diagnosis not present

## 2020-11-29 DIAGNOSIS — Z951 Presence of aortocoronary bypass graft: Secondary | ICD-10-CM

## 2020-11-29 DIAGNOSIS — I200 Unstable angina: Secondary | ICD-10-CM

## 2020-11-29 DIAGNOSIS — I255 Ischemic cardiomyopathy: Secondary | ICD-10-CM | POA: Insufficient documentation

## 2020-11-29 DIAGNOSIS — I2510 Atherosclerotic heart disease of native coronary artery without angina pectoris: Secondary | ICD-10-CM

## 2020-11-29 DIAGNOSIS — Z9861 Coronary angioplasty status: Secondary | ICD-10-CM

## 2020-11-29 DIAGNOSIS — I10 Essential (primary) hypertension: Secondary | ICD-10-CM | POA: Diagnosis not present

## 2020-11-29 DIAGNOSIS — Z79899 Other long term (current) drug therapy: Secondary | ICD-10-CM | POA: Diagnosis not present

## 2020-11-29 DIAGNOSIS — G4733 Obstructive sleep apnea (adult) (pediatric): Secondary | ICD-10-CM | POA: Diagnosis not present

## 2020-11-29 DIAGNOSIS — J439 Emphysema, unspecified: Secondary | ICD-10-CM

## 2020-11-29 DIAGNOSIS — E1140 Type 2 diabetes mellitus with diabetic neuropathy, unspecified: Secondary | ICD-10-CM

## 2020-11-29 DIAGNOSIS — I739 Peripheral vascular disease, unspecified: Secondary | ICD-10-CM

## 2020-11-29 NOTE — Assessment & Plan Note (Signed)
Fatigue and some chest pain with new EKG changes last office visit- no c/o chest pain today.  Nuclear stress is pending.

## 2020-11-29 NOTE — Assessment & Plan Note (Signed)
Controlled- losartan changed to Lisinopril secondary to perceived side effects but patient admits no change in his symptoms- fatigue

## 2020-11-29 NOTE — Assessment & Plan Note (Signed)
On Jardiance and Glucophage

## 2020-11-29 NOTE — Assessment & Plan Note (Signed)
Followed by Dr Sood 

## 2020-11-29 NOTE — Assessment & Plan Note (Signed)
S/P MDT ICD 2012- not replaced at [facility]- EF 45-50% Feb 2021

## 2020-11-29 NOTE — Progress Notes (Signed)
Cardiology Office Note:    Date:  11/29/2020   ID:  [name], DOB 1956-07-13, MRN [id number]  PCP:  Venia Carbon, MD  Cardiologist:  Minus Breeding, MD  Electrophysiologist:  None   Referring MD: Venia Carbon, MD   CC: fatigue  History of Present Illness:    [name] is a 64 y.o. male with a hx of coronary disease.  He had moderate disease in 2012 at catheterization.  In January 2019 he had an intervention to his RCA in the setting of an NSTEMI.  In February 2021 he underwent CABG x4.  He had left atrial clipping at that time.  He has a history of an ischemic cardiomyopathy and actually had a Medtronic ICD placed in 2012.  This was not replaced at [facility] as his ejection fraction had improved in 2015.  He has treated hypertension and non-insulin-dependent diabetes.  He has vascular disease and in May 2019 underwent aortic stent graft placement with left common femoral artery repair.  He is obese has sleep apnea and COPD.  December 2020 he had CO VID pneumonia.  The patient was seen in the office 11/08/2020 and complained of chest pain.  He also felt like he was having a reaction to losartan.  His losartan was changed to lisinopril.  He was placed on low-dose Imdur.  He did have some lateral T wave inversion which appeared to be new compared to his October EKG.  He was set up for a outpatient nuclear stress.  He also was set up for follow-up labs as he had an elevated potassium of 5.4.  Patient was put on my schedule today, he has not yet had his stress test and I do not yet have his lab results.  Symptomatically he is vague, he says he is not having chest pain.  His main complaint is fatigue, "I do not want to get out of bed in the morning".  I asked him about depression and he says he has not considered taking medications for depression.  He says he feels frustrated because he cannot "do anything".  He reports compliance with his CPAP.  He had a TSH a year ago that was  normal.  Past Medical History:  Diagnosis Date  . Arthritis   . Automatic implantable cardioverter-defibrillator in situ   . COPD (chronic obstructive pulmonary disease) ([place])    emphysema by CXR  . Coronary artery disease   . Diabetes mellitus    type 2  no meds  . Diverticulosis of colon   . GERD (gastroesophageal reflux disease)    with esophagitis  . History of colonic polyps   . Hypertension   . Non-ischemic cardiomyopathy ([place])   . Presence of permanent cardiac pacemaker   . PVD (peripheral vascular disease) ([place])    bilateral common iliac artery aneurysms. Right SFA occlusion over a long segment. Left SFA disease with occlusion of the left TP trunk. Artirogram Oct. 2006  . Sleep apnea   . Tobacco abuse   . Urinary incontinence    detrussor instability    Past Surgical History:  Procedure Laterality Date  . ABDOMINAL AORTIC ENDOVASCULAR STENT GRAFT N/A 05/07/2018   Procedure: ABDOMINAL AORTIC ENDOVASCULAR STENT GRAFT;  Surgeon: Conrad McGregor, MD;  Location: [place];  Service: Vascular;  Laterality: N/A;  . CARDIAC CATHETERIZATION  06/2004   negative  . CARDIAC CATHETERIZATION  02/08/2020  . CLIPPING OF ATRIAL APPENDAGE N/A 02/11/2020   Procedure: Clipping Of Atrial  Appendage using AtriCure 35 MM AtriClip.;  Surgeon: Wonda Olds, MD;  Location: [place];  Service: Open Heart Surgery;  Laterality: N/A;  . COLONOSCOPY  04/2005  . COPD exacerbation    . CORONARY ANGIOPLASTY  01/09/2018  . CORONARY ARTERY BYPASS GRAFT N/A 02/11/2020   Procedure: CORONARY ARTERY BYPASS GRAFTING (CABG) using LIMA to LAD; RIMA to PL; Endoscopic right greater saphenous vein harvest: SVC to Diag; SVC to OM1.;  Surgeon: Wonda Olds, MD;  Location: [place];  Service: Open Heart Surgery;  Laterality: N/A;  BILATERAL IMA  . CORONARY STENT INTERVENTION N/A 01/10/2018   Procedure: CORONARY STENT INTERVENTION;  Surgeon: Lorretta Harp, MD;  Location: [place] CV LAB;  Service: Cardiovascular;   Laterality: N/A;  . CORONARY/GRAFT ACUTE MI REVASCULARIZATION N/A 01/10/2018   Procedure: Coronary/Graft Acute MI Revascularization;  Surgeon: Lorretta Harp, MD;  Location: [place] [facility];  Service: Cardiovascular;  Laterality: N/A;  . EMBOLIZATION Left 03/19/2018  . EMBOLIZATION Left 03/19/2018   Procedure: EMBOLIZATION - Left Internal;  Surgeon: Conrad Goddard, MD;  Location: [place] [facility];  Service: Cardiovascular;  Laterality: Left;  . EMBOLIZATION Right 04/16/2018   Procedure: EMBOLIZATION;  Surgeon: Conrad Drumright, MD;  Location: [place] [facility];  Service: Cardiovascular;  Laterality: Right;  . ENDOVEIN HARVEST OF GREATER SAPHENOUS VEIN Right 02/11/2020   Procedure: [place] Of Greater Saphenous Vein using right lower extremity.;  Surgeon: Wonda Olds, MD;  Location: [facility] OR;  Service: Open Heart Surgery;  Laterality: Right;  . FEMORAL ARTERY EXPLORATION Right 05/07/2018   Procedure: FEMORAL ARTERY EXPLORATION, EXTENDED PROFUNDAPLASTY;  Surgeon: Conrad Tyrone, MD;  Location: [place];  Service: Vascular;  Laterality: Right;  . INTRAOPERATIVE ARTERIOGRAM Right 05/07/2018   Procedure: INTRA OPERATIVE ARTERIOGRAM;  Surgeon: Conrad Lashmeet, MD;  Location: [place];  Service: Vascular;  Laterality: Right;  . LEFT HEART CATH AND CORONARY ANGIOGRAPHY N/A 01/10/2018   Procedure: LEFT HEART CATH AND CORONARY ANGIOGRAPHY;  Surgeon: Lorretta Harp, MD;  Location: [place] [facility];  Service: Cardiovascular;  Laterality: N/A;  . LEFT HEART CATH AND CORONARY ANGIOGRAPHY N/A 02/08/2020   Procedure: LEFT HEART CATH AND CORONARY ANGIOGRAPHY;  Surgeon: Troy Sine, MD;  Location: [place] [facility];  Service: Cardiovascular;  Laterality: N/A;  . PACEMAKER INSERTION  11/2005  . PACEMAKER LEAD REMOVAL N/A 10/17/2014   Procedure: PACEMAKER LEAD REMOVAL;  Surgeon: Evans Lance, MD;  Location: [place];  Service: Cardiovascular;  Laterality: N/A;  . RENAL ANGIOGRAPHY Left 04/16/2018    Procedure: RENAL ANGIOGRAPHY;  Surgeon: Conrad [place], MD;  Location: [place] [facility];  Service: Cardiovascular;  Laterality: Left;  . s/p ICD placement      Medtronic Maximo #7232 single chamber  . TEE WITHOUT CARDIOVERSION N/A 02/11/2020   Procedure: TRANSESOPHAGEAL ECHOCARDIOGRAM (TEE);  Surgeon: Wonda Olds, MD;  Location: [place];  Service: Open Heart Surgery;  Laterality: N/A;    Current Medications: Current Meds  Medication Sig  . albuterol (VENTOLIN HFA) 108 (90 Base) MCG/ACT inhaler TAKE 2 PUFFS BY MOUTH EVERY 6 HOURS AS NEEDED FOR WHEEZE OR SHORTNESS OF BREATH *USE W/ SPACER*  . aspirin 81 MG chewable tablet Chew 1 tablet (81 mg total) by mouth daily.  Marland Kitchen atorvastatin (LIPITOR) 80 MG tablet TAKE 1 TABLET (80 MG TOTAL) BY MOUTH DAILY AT 6 PM.  . clopidogrel (PLAVIX) 75 MG tablet TAKE 1 TABLET BY MOUTH EVERY DAY  . empagliflozin (  JARDIANCE) 10 MG TABS tablet Take 1 tablet (10 mg total) by mouth daily.  . furosemide (LASIX) 20 MG tablet Take 1 tablet (20 mg total) by mouth daily.  Marland Kitchen glipiZIDE (GLUCOTROL) 5 MG tablet TAKE 1 TABLET (5 MG TOTAL) BY MOUTH 2 (TWO) TIMES DAILY BEFORE A MEAL.  . isosorbide mononitrate (IMDUR) 30 MG 24 hr tablet Take 1 Tablet in the Evening  . lisinopril (ZESTRIL) 2.5 MG tablet Take 1 tablet (2.5 mg total) by mouth daily.  . metFORMIN (GLUCOPHAGE-XR) 500 MG 24 hr tablet TAKE 2 TABLETS BY MOUTH EVERY DAY WITH BREAKFAST  . metoprolol tartrate (LOPRESSOR) 25 MG tablet Take 1 tablet (25 mg total) by mouth 2 (two) times daily.  . Multiple Vitamin (MULTIVITAMIN WITH MINERALS) TABS tablet Take 1 tablet by mouth daily.  . nitroGLYCERIN (NITROSTAT) 0.4 MG SL tablet Place 1 tablet (0.4 mg total) under the tongue every 5 (five) minutes as needed for chest pain.  . pantoprazole (PROTONIX) 40 MG tablet TAKE 1 TABLET BY MOUTH EVERY DAY  . spironolactone (ALDACTONE) 25 MG tablet Take 1 tablet (25 mg total) by mouth daily.  Marland Kitchen umeclidinium-vilanterol (ANORO ELLIPTA)  62.5-25 MCG/INH AEPB Inhale 1 puff into the lungs daily.  . vitamin B-12 (CYANOCOBALAMIN) 1000 MCG tablet Take 1,000 mcg by mouth daily.      Allergies:   Patient has no known allergies.   Social History   Socioeconomic History  . Marital status: Married    Spouse name: Not on file  . Number of children: 2  . Years of education: Not on file  . Highest education level: Not on file  Occupational History  . Occupation: Grave digger--now disabled  Tobacco Use  . Smoking status: Former Smoker    Packs/day: 3.00    Years: 30.00    Pack years: 90.00    Types: Cigarettes    Quit date: 01/09/2018    Years since quitting: 2.8  . Smokeless tobacco: Never Used  . Tobacco comment: GAVE 1-800-QUIT-NOW  Vaping Use  . Vaping Use: Never used  Substance and Sexual Activity  . Alcohol use: No  . Drug use: No  . Sexual activity: Not Currently  Other Topics Concern  . Not on file  Social History Narrative   No living will   Requests wife as health care POA   Would accept resuscitation but doesn't want prolonged ventilation   No tube feeds if cognitively unaware   Social Determinants of Health   Financial Resource Strain:   . Difficulty of Paying Living Expenses: Not on file  Food Insecurity:   . Worried About Charity fundraiser in the Last Year: Not on file  . Ran Out of Food in the Last Year: Not on file  Transportation Needs:   . Lack of Transportation (Medical): Not on file  . Lack of Transportation (Non-Medical): Not on file  Physical Activity:   . Days of Exercise per Week: Not on file  . Minutes of Exercise per Session: Not on file  Stress:   . Feeling of Stress : Not on file  Social Connections:   . Frequency of Communication with Friends and Family: Not on file  . Frequency of Social Gatherings with Friends and Family: Not on file  . Attends Religious Services: Not on file  . Active Member of Clubs or Organizations: Not on file  . Attends Archivist Meetings:  Not on file  . Marital Status: Not on file     Family  History: The patient's family history includes Cancer in his brother and sister; Coronary artery disease in his maternal aunt; Heart failure in his maternal aunt; Lung cancer in his maternal aunt and maternal uncle; Stroke in his father.  ROS:   Please see the history of present illness.     All other systems reviewed and are negative.  EKGs/Labs/Other Studies Reviewed:    The following studies were reviewed today:   EKG:  EKG is not ordered today.  The ekg ordered 11/08/2020 demonstrates NSR, inferior lateral TWI- new c/w EKG from Oct 2021- there was mild LVH noted  Recent Labs: 12/10/2019: ALT 27 02/15/2020: Magnesium 1.7 05/21/2020: Hemoglobin 13.0; Platelets 301 07/18/2020: Pro B Natriuretic peptide (BNP) 146.0 11/20/2020: BUN 14; Creatinine, Ser 1.00; Potassium 5.4; Sodium 141  Recent Lipid Panel    Component Value Date/Time   CHOL 139 11/03/2020 0858   TRIG 333 (H) 11/03/2020 0858   TRIG 575 (HH) 01/02/2007 0000   HDL 37 (L) 11/03/2020 0858   CHOLHDL 3.8 11/03/2020 0858   CHOLHDL 3 12/10/2019 1131   VLDL 61.4 (H) 12/10/2019 1131   LDLCALC 51 11/03/2020 0858   LDLDIRECT 26.0 12/10/2019 1131    Physical Exam:    VS:  BP 112/70   Pulse 77   Ht 5\' 10"  (1.778 m)   Wt 280 lb 12.8 oz (127.4 kg)   SpO2 93%   BMI 40.29 kg/m     Wt Readings from Last 3 Encounters:  11/29/20 280 lb 12.8 oz (127.4 kg)  11/09/20 280 lb 3.2 oz (127.1 kg)  11/08/20 280 lb 6.4 oz (127.2 kg)     GEN: Obese caucasian male, in no acute distress HEENT: Normal NECK: No JVD; No carotid bruits CARDIAC: RRR, no murmurs, rubs, gallops RESPIRATORY:  Clear to auscultation without rales, wheezing or rhonchi  ABDOMEN: Obese, soft, non-distended MUSCULOSKELETAL:  No edema; No deformity  SKIN: Warm and dry NEUROLOGIC:  Alert and oriented x 3 PSYCHIATRIC:  Somewhat depressed affect   ASSESSMENT:    Fatigue Fatigue and some chest pain with  new EKG changes last office visit- no c/o chest pain today.  Nuclear stress is pending.  S/P CABG x 4 CABG x 27 Jan 2020  Ischemic cardiomyopathy S/P MDT ICD 2012- not replaced at [facility]- EF 45-50% Feb 2021  COPD (chronic obstructive pulmonary disease) with emphysema ([place]) Followed by Dr Halford Chessman  Essential hypertension Controlled- losartan changed to Lisinopril secondary to perceived side effects but patient admits no change in his symptoms- fatigue  Type 2 diabetes mellitus with diabetic neuropathy (H[facility]) On Jardiance and Glucophage  OSA (obstructive sleep apnea) Reports compliance with C-pap  PLAN:    Awaiting Myoview and labs results- f/u pending these. No change in current Rx.    Medication Adjustments/Labs and Tests Ordered: Current medicines are reviewed at length with the patient today.  Concerns regarding medicines are outlined above.  No orders of the defined types were placed in this encounter.  No orders of the defined types were placed in this encounter.   Patient Instructions  Medication Instructions:  Continue current medications  *If you need a refill on your cardiac medications before your next appointment, please call your pharmacy*   Lab Work: None Ordered   Testing/Procedures: None Ordered   Follow-Up: At [facility], you and your health needs are our priority.  As part of our continuing mission to provide you with exceptional heart care, we have created designated Provider Care Teams.  These Care Teams include  your primary Cardiologist (physician) and Advanced Practice Providers (APPs -  Physician Assistants and Nurse Practitioners) who all work together to provide you with the care you need, when you need it.  We recommend signing up for the patient portal called "MyChart".  Sign up information is provided on this After Visit Summary.  MyChart is used to connect with patients for Virtual Visits (Telemedicine).  Patients are able to view lab/test  results, encounter notes, upcoming appointments, etc.  Non-urgent messages can be sent to your provider as well.   To learn more about what you can do with MyChart, go to [email or web address].    Your next appointment:   3 month(s)  The format for your next appointment:   In Person  Provider:   You may see Minus Breeding, MD or one of the following Advanced Practice Providers on your designated Care Team:    Rosaria Ferries, PA-C  Jory Sims, DNP, ANP        Signed, Kerin Ransom, PA-C  11/29/2020 9:38 AM    [place]

## 2020-11-29 NOTE — Assessment & Plan Note (Signed)
CABG x 27 Jan 2020

## 2020-11-29 NOTE — Assessment & Plan Note (Signed)
Reports compliance with C-pap 

## 2020-11-29 NOTE — Patient Instructions (Signed)
Medication Instructions:  Continue current medications  *If you need a refill on your cardiac medications before your next appointment, please call your pharmacy*   Lab Work: None Ordered   Testing/Procedures: None Ordered   Follow-Up: At [facility], you and your health needs are our priority.  As part of our continuing mission to provide you with exceptional heart care, we have created designated Provider Care Teams.  These Care Teams include your primary Cardiologist (physician) and Advanced Practice Providers (APPs -  Physician Assistants and Nurse Practitioners) who all work together to provide you with the care you need, when you need it.  We recommend signing up for the patient portal called "MyChart".  Sign up information is provided on this After Visit Summary.  MyChart is used to connect with patients for Virtual Visits (Telemedicine).  Patients are able to view lab/test results, encounter notes, upcoming appointments, etc.  Non-urgent messages can be sent to your provider as well.   To learn more about what you can do with MyChart, go to [email or web address].    Your next appointment:   3 month(s)  The format for your next appointment:   In Person  Provider:   You may see James Hochrein, MD or one of the following Advanced Practice Providers on your designated Care Team:    Rhonda Barrett, PA-C  Kathryn Lawrence, DNP, ANP     

## 2020-11-30 LAB — BASIC METABOLIC PANEL
BUN/Creatinine Ratio: 15 (ref 10–24)
BUN: 15 mg/dL (ref 8–27)
CO2: 23 mmol/L (ref 20–29)
Calcium: 9.3 mg/dL (ref 8.6–10.2)
Chloride: 102 mmol/L (ref 96–106)
Creatinine, Ser: 1.01 mg/dL (ref 0.76–1.27)
GFR calc Af Amer: 90 mL/min/{1.73_m2} (ref >59)
GFR calc non Af Amer: 78 mL/min/{1.73_m2} (ref >59)
Glucose: 131 mg/dL — ABNORMAL HIGH (ref 65–99)
Potassium: 5.5 mmol/L — ABNORMAL HIGH (ref 3.5–5.2)
Sodium: 138 mmol/L (ref 134–144)

## 2020-12-01 ENCOUNTER — Telehealth: Payer: Self-pay

## 2020-12-01 DIAGNOSIS — E875 Hyperkalemia: Secondary | ICD-10-CM

## 2020-12-01 NOTE — Telephone Encounter (Signed)
-----   Message from Minus Breeding, MD sent at 11/30/2020  8:30 AM EST ----- Stop his spironolactone and add this to the allergy list (hyperkalemia).  Repeat BMET in two weeks. Marland Kitchen

## 2020-12-01 NOTE — Telephone Encounter (Signed)
Spoke with patient. Informed patient of lab results and recommendations from Dr. Percival Spanish. Lab slips mailed to patient.

## 2020-12-04 ENCOUNTER — Other Ambulatory Visit: Payer: Self-pay

## 2020-12-04 ENCOUNTER — Ambulatory Visit (HOSPITAL_COMMUNITY): Payer: BC Managed Care – PPO | Attending: Internal Medicine

## 2020-12-04 VITALS — Ht 71.0 in | Wt 283.0 lb

## 2020-12-04 DIAGNOSIS — I200 Unstable angina: Secondary | ICD-10-CM | POA: Diagnosis not present

## 2020-12-04 DIAGNOSIS — Z951 Presence of aortocoronary bypass graft: Secondary | ICD-10-CM | POA: Diagnosis not present

## 2020-12-04 DIAGNOSIS — I2510 Atherosclerotic heart disease of native coronary artery without angina pectoris: Secondary | ICD-10-CM | POA: Insufficient documentation

## 2020-12-04 DIAGNOSIS — I10 Essential (primary) hypertension: Secondary | ICD-10-CM | POA: Insufficient documentation

## 2020-12-04 DIAGNOSIS — R110 Nausea: Secondary | ICD-10-CM

## 2020-12-04 DIAGNOSIS — E1140 Type 2 diabetes mellitus with diabetic neuropathy, unspecified: Secondary | ICD-10-CM | POA: Diagnosis not present

## 2020-12-04 DIAGNOSIS — E785 Hyperlipidemia, unspecified: Secondary | ICD-10-CM | POA: Diagnosis not present

## 2020-12-04 DIAGNOSIS — I5022 Chronic systolic (congestive) heart failure: Secondary | ICD-10-CM | POA: Insufficient documentation

## 2020-12-04 MED ORDER — TECHNETIUM TC 99M TETROFOSMIN IV KIT
32.4000 | PACK | Freq: Once | INTRAVENOUS | Status: AC | PRN
  Administered 2020-12-04: 32.4 via INTRAVENOUS
  Filled 2020-12-04: qty 33

## 2020-12-04 MED ORDER — AMINOPHYLLINE 25 MG/ML IV SOLN
75.0000 mg | Freq: Once | INTRAVENOUS | Status: AC
  Administered 2020-12-04: 75 mg via INTRAVENOUS

## 2020-12-04 MED ORDER — REGADENOSON 0.4 MG/5ML IV SOLN
0.4000 mg | Freq: Once | INTRAVENOUS | Status: AC
  Administered 2020-12-04: 0.4 mg via INTRAVENOUS

## 2020-12-05 ENCOUNTER — Ambulatory Visit (HOSPITAL_COMMUNITY): Payer: Medicare Other | Attending: Cardiology

## 2020-12-05 LAB — MYOCARDIAL PERFUSION IMAGING
LV dias vol: 225 mL (ref 62–150)
LV sys vol: 157 mL
Peak HR: 89 {beats}/min
Rest HR: 67 {beats}/min
SDS: 3
SRS: 0
SSS: 3
TID: 1.07

## 2020-12-05 MED ORDER — TECHNETIUM TC 99M TETROFOSMIN IV KIT
30.2000 | PACK | Freq: Once | INTRAVENOUS | Status: AC | PRN
  Administered 2020-12-05: 30.2 via INTRAVENOUS
  Filled 2020-12-05: qty 31

## 2020-12-17 DIAGNOSIS — I4819 Other persistent atrial fibrillation: Secondary | ICD-10-CM | POA: Diagnosis not present

## 2020-12-19 DIAGNOSIS — E875 Hyperkalemia: Secondary | ICD-10-CM | POA: Diagnosis not present

## 2020-12-19 LAB — BASIC METABOLIC PANEL
BUN/Creatinine Ratio: 16 (ref 10–24)
BUN: 14 mg/dL (ref 8–27)
CO2: 22 mmol/L (ref 20–29)
Calcium: 9.5 mg/dL (ref 8.6–10.2)
Chloride: 101 mmol/L (ref 96–106)
Creatinine, Ser: 0.9 mg/dL (ref 0.76–1.27)
GFR calc Af Amer: 104 mL/min/{1.73_m2} (ref >59)
GFR calc non Af Amer: 90 mL/min/{1.73_m2} (ref >59)
Glucose: 319 mg/dL — ABNORMAL HIGH (ref 65–99)
Potassium: 5.4 mmol/L — ABNORMAL HIGH (ref 3.5–5.2)
Sodium: 137 mmol/L (ref 134–144)

## 2020-12-25 ENCOUNTER — Telehealth: Payer: Self-pay

## 2020-12-25 ENCOUNTER — Ambulatory Visit
Admission: EM | Admit: 2020-12-25 | Discharge: 2020-12-25 | Disposition: A | Discharge status: Home / Self Care | Payer: Medicare Other | Attending: Family Medicine | Admitting: Family Medicine

## 2020-12-25 ENCOUNTER — Other Ambulatory Visit: Payer: Self-pay

## 2020-12-25 DIAGNOSIS — B9789 Other viral agents as the cause of diseases classified elsewhere: Secondary | ICD-10-CM | POA: Diagnosis not present

## 2020-12-25 DIAGNOSIS — J028 Acute pharyngitis due to other specified organisms: Secondary | ICD-10-CM | POA: Diagnosis not present

## 2020-12-25 DIAGNOSIS — R0602 Shortness of breath: Secondary | ICD-10-CM | POA: Diagnosis not present

## 2020-12-25 DIAGNOSIS — J441 Chronic obstructive pulmonary disease with (acute) exacerbation: Secondary | ICD-10-CM | POA: Diagnosis not present

## 2020-12-25 DIAGNOSIS — E875 Hyperkalemia: Secondary | ICD-10-CM

## 2020-12-25 MED ORDER — BENZONATATE 100 MG PO CAPS
100.0000 mg | ORAL_CAPSULE | Freq: Three times a day (TID) | ORAL | 0 refills | Status: DC | PRN
Start: 2020-12-25 — End: 2021-03-21

## 2020-12-25 MED ORDER — DOXYCYCLINE HYCLATE 100 MG PO CAPS: 100.0000 mg | ORAL_CAPSULE | Freq: Two times a day (BID) | ORAL | 0 refills | Status: AC

## 2020-12-25 MED ORDER — PREDNISONE 20 MG PO TABS: 40.0000 mg | ORAL_TABLET | Freq: Every day | ORAL | 0 refills | Status: DC

## 2020-12-25 NOTE — ED Provider Notes (Signed)
[name]    CSN: [id number] Arrival date & time: 12/25/20  0808      History   Chief Complaint Chief Complaint  Patient presents with  . Headache    HPI [name] is a 65 y.o. male.   HPI  Patient presents with URI symptoms including cough, sore throat,  nasal congestion, runny nose, and sinus pressure. Unknown of COVID exposure. COVID Vaccinated: Y and Denies worrisome symptoms of shortness of breath, weakness, N&V, chest pain or leg pain. Past Medical History:  Diagnosis Date  . Arthritis   . Automatic implantable cardioverter-defibrillator in situ   . COPD (chronic obstructive pulmonary disease) ([place])    emphysema by CXR  . Coronary artery disease   . Diabetes mellitus    type 2  no meds  . Diverticulosis of colon   . GERD (gastroesophageal reflux disease)    with esophagitis  . History of colonic polyps   . Hypertension   . Non-ischemic cardiomyopathy ([place])   . Presence of permanent cardiac pacemaker   . PVD (peripheral vascular disease) ([place])    bilateral common iliac artery aneurysms. Right SFA occlusion over a long segment. Left SFA disease with occlusion of the left TP trunk. Artirogram Oct. 2006  . Sleep apnea   . Tobacco abuse   . Urinary incontinence    detrussor instability    Patient Active Problem List   Diagnosis Date Noted  . Ischemic cardiomyopathy 11/29/2020  . Fatigue 11/29/2020  . PAF (paroxysmal atrial fibrillation) ([place]) 03/01/2020  . Persistent atrial fibrillation ([place]) 02/16/2020  . S/P CABG x 4 02/11/2020  . Coronary artery disease involving native coronary artery of native heart with unstable angina pectoris ([place]) 02/08/2020  . Osteoarthritis of left shoulder 12/10/2019  . Knee pain, left 06/29/2019  . Cervical sprain 01/28/2019  . Subacromial impingement of left shoulder 01/19/2019  . Type 2 diabetes mellitus with diabetic neuropathy ([place]) 01/19/2019  . PVD (peripheral vascular disease) ([place]) 10/13/2018  . PVC's  (premature ventricular contractions) 10/13/2018  . Iliac artery occlusion ([facility]) 03/19/2018  . Aneurysm of iliac artery ([place]) 03/06/2018  . Status post coronary artery stent placement   . AAA (abdominal aortic aneurysm) without rupture ([place])   . Bilateral carotid artery stenosis 04/28/2017  . Chronic pain 04/14/2017  . Cerumen impaction 11/25/2016  . Essential hypertension 09/17/2016  . CAD S/P percutaneous coronary angioplasty 09/17/2016  . Mood disorder ([place]) 11/29/2015  . Left leg swelling 05/15/2015  . COPD exacerbation ([place]) 12/15/2012  . Routine general medical examination at a health care facility 10/26/2012  . Atherosclerosis of native coronary artery with angina pectoris ([place]) 10/17/2011  . OSA (obstructive sleep apnea) 08/28/2011  . Obesity 08/27/2011  . Chronic systolic heart failure ([place]) 08/06/2011  . B12 DEFICIENCY 04/14/2009  . CAROTID BRUIT 02/16/2009  . Diabetic polyneuropathy ([place]) 12/07/2008  . URINARY INCONTINENCE 05/27/2008  . COPD (chronic obstructive pulmonary disease) with emphysema ([place]) 02/09/2008  . Dyslipidemia, goal LDL below 70 10/13/2007  . DIVERTICULOSIS, COLON 09/15/2007  . COLONIC POLYPS, HX OF 09/15/2007  . Type 2 diabetes, controlled, with peripheral circulatory disorder ([place]) 05/25/2007  . GERD 05/25/2007  . REFLUX ESOPHAGITIS 04/23/2007    Past Surgical History:  Procedure Laterality Date  . ABDOMINAL AORTIC ENDOVASCULAR STENT GRAFT N/A 05/07/2018   Procedure: ABDOMINAL AORTIC ENDOVASCULAR STENT GRAFT;  Surgeon: Conrad Mertztown, MD;  Location: [facility];  Service: Vascular;  Laterality: N/A;  . CARDIAC CATHETERIZATION  06/2004   negative  .  CARDIAC CATHETERIZATION  02/08/2020  . CLIPPING OF ATRIAL APPENDAGE N/A 02/11/2020   Procedure: Clipping Of Atrial Appendage using AtriCure 35 MM AtriClip.;  Surgeon: Wonda Olds, MD;  [facility];  Service: Open Heart Surgery;  Laterality: N/A;  . COLONOSCOPY  04/2005  . COPD exacerbation    .  CORONARY ANGIOPLASTY  01/09/2018  . CORONARY ARTERY BYPASS GRAFT N/A 02/11/2020   Procedure: CORONARY ARTERY BYPASS GRAFTING (CABG) using LIMA to LAD; RIMA to PL; Endoscopic right greater saphenous vein harvest: SVC to Diag; SVC to OM1.;  Surgeon: Wonda Olds, MD;  Location: [facility];  Service: Open Heart Surgery;  Laterality: N/A;  BILATERAL IMA  . CORONARY STENT INTERVENTION N/A 01/10/2018   Procedure: CORONARY STENT INTERVENTION;  Surgeon: Lorretta Harp, MD;  Location: [facility];  Service: Cardiovascular;  Laterality: N/A;  . CORONARY/GRAFT ACUTE MI REVASCULARIZATION N/A 01/10/2018   Procedure: Coronary/Graft Acute MI Revascularization;  Surgeon: Lorretta Harp, MD;  Location: [facility];  Service: Cardiovascular;  Laterality: N/A;  . EMBOLIZATION Left 03/19/2018  . EMBOLIZATION Left 03/19/2018   Procedure: EMBOLIZATION - Left Internal;  Surgeon: Conrad [place], MD;  Location: [facility];  Service: Cardiovascular;  Laterality: Left;  . EMBOLIZATION Right 04/16/2018   Procedure: EMBOLIZATION;  Surgeon: Conrad [place], MD;  Location: [facility];  Service: Cardiovascular;  Laterality: Right;  . ENDOVEIN HARVEST OF GREATER SAPHENOUS VEIN Right 02/11/2020   Procedure: [place] Of Greater Saphenous Vein using right lower extremity.;  Surgeon: Wonda Olds, MD;  Location: [facility] OR;  Service: Open Heart Surgery;  Laterality: Right;  . FEMORAL ARTERY EXPLORATION Right 05/07/2018   Procedure: FEMORAL ARTERY EXPLORATION, EXTENDED PROFUNDAPLASTY;  Surgeon: Conrad [place], MD;  Location: [facility];  Service: Vascular;  Laterality: Right;  . INTRAOPERATIVE ARTERIOGRAM Right 05/07/2018   Procedure: INTRA OPERATIVE ARTERIOGRAM;  Surgeon: Conrad [place], MD;  Location: [facility];  Service: Vascular;  Laterality: Right;  . LEFT HEART CATH AND CORONARY ANGIOGRAPHY N/A 01/10/2018   Procedure: LEFT HEART CATH AND CORONARY ANGIOGRAPHY;  Surgeon: Lorretta Harp, MD;  Location: [facility];  Service: Cardiovascular;  Laterality: N/A;  . LEFT HEART CATH AND CORONARY ANGIOGRAPHY N/A 02/08/2020   Procedure: LEFT HEART CATH AND CORONARY ANGIOGRAPHY;  Surgeon: Troy Sine, MD;  Location: [facility];  Service: Cardiovascular;  Laterality: N/A;  . PACEMAKER INSERTION  11/2005  . PACEMAKER LEAD REMOVAL N/A 10/17/2014   Procedure: PACEMAKER LEAD REMOVAL;  Surgeon: Evans Lance, MD;  Location: [facility];  Service: Cardiovascular;  Laterality: N/A;  . RENAL ANGIOGRAPHY Left 04/16/2018   Procedure: RENAL ANGIOGRAPHY;  Surgeon: Conrad [place], MD;  Location: [facility];  Service: Cardiovascular;  Laterality: Left;  . s/p ICD placement      Medtronic Maximo #7232 single chamber  . TEE WITHOUT CARDIOVERSION N/A 02/11/2020   Procedure: TRANSESOPHAGEAL ECHOCARDIOGRAM (TEE);  Surgeon: Wonda Olds, MD;  Location: [facility];  Service: Open Heart Surgery;  Laterality: N/A;       Home Medications    Prior to Admission medications   Medication Sig Start Date End Date Taking? Authorizing Provider  albuterol (VENTOLIN HFA) 108 (90 Base) MCG/ACT inhaler TAKE 2 PUFFS BY MOUTH EVERY 6 HOURS AS NEEDED FOR WHEEZE OR SHORTNESS OF BREATH *USE W/ SPACER* 07/03/20   Venia Carbon, MD  aspirin 81 MG chewable tablet Chew 1 tablet (81 mg total)  by mouth daily. 02/18/20   Gold, Wayne E, PA-C  atorvastatin (LIPITOR) 80 MG tablet TAKE 1 TABLET (80 MG TOTAL) BY MOUTH DAILY AT 6 PM. 09/13/20   Karie Schwalbe, MD  clopidogrel (PLAVIX) 75 MG tablet TAKE 1 TABLET BY MOUTH EVERY DAY 10/09/20   Maeola Harman, MD  empagliflozin (JARDIANCE) 10 MG TABS tablet Take 1 tablet (10 mg total) by mouth daily. 09/25/20   Rollene Rotunda, MD  furosemide (LASIX) 20 MG tablet Take 1 tablet (20 mg total) by mouth daily. 09/25/20   Rollene Rotunda, MD  glipiZIDE (GLUCOTROL) 5 MG tablet TAKE 1 TABLET (5 MG TOTAL) BY MOUTH 2 (TWO) TIMES DAILY BEFORE A MEAL. 08/03/20   Karie Schwalbe, MD   isosorbide mononitrate (IMDUR) 30 MG 24 hr tablet Take 1 Tablet in the Evening 11/08/20   Duke, Roe Rutherford, PA  lisinopril (ZESTRIL) 2.5 MG tablet Take 1 tablet (2.5 mg total) by mouth daily. 11/08/20 02/06/21  Marcelino Duster, PA  metFORMIN (GLUCOPHAGE-XR) 500 MG 24 hr tablet TAKE 2 TABLETS BY MOUTH EVERY DAY WITH BREAKFAST 01/18/20   Tillman Abide I, MD  metoprolol tartrate (LOPRESSOR) 25 MG tablet Take 1 tablet (25 mg total) by mouth 2 (two) times daily. 03/27/20   Rollene Rotunda, MD  Multiple Vitamin (MULTIVITAMIN WITH MINERALS) TABS tablet Take 1 tablet by mouth daily.    [provider]  nitroGLYCERIN (NITROSTAT) 0.4 MG SL tablet Place 1 tablet (0.4 mg total) under the tongue every 5 (five) minutes as needed for chest pain. 11/08/20 02/06/21  Marcelino Duster, PA  pantoprazole (PROTONIX) 40 MG tablet TAKE 1 TABLET BY MOUTH EVERY DAY 11/14/20   Tillman Abide I, MD  umeclidinium-vilanterol (ANORO ELLIPTA) 62.5-25 MCG/INH AEPB Inhale 1 puff into the lungs daily. 11/09/20   Coralyn Helling, MD  vitamin B-12 (CYANOCOBALAMIN) 1000 MCG tablet Take 1,000 mcg by mouth daily.     [provider]    Family History Family History  Problem Relation Age of Onset  . Stroke Father   . Coronary artery disease Maternal Aunt   . Heart failure Maternal Aunt   . Lung cancer Maternal Aunt   . Lung cancer Maternal Uncle   . Cancer Brother        Mouth  . Cancer Sister        throat    Social History Social History   Tobacco Use  . Smoking status: Former Smoker    Packs/day: 3.00    Years: 30.00    Pack years: 90.00    Types: Cigarettes    Quit date: 01/09/2018    Years since quitting: 2.9  . Smokeless tobacco: Never Used  . Tobacco comment: GAVE 1-800-QUIT-NOW  Vaping Use  . Vaping Use: Never used  Substance Use Topics  . Alcohol use: No  . Drug use: No     Allergies   Spironolactone   Review of Systems Review of Systems Pertinent negatives listed in  HPI  Physical Exam Triage Vital Signs ED Triage Vitals  Enc Vitals Group     BP 12/25/20 0817 134/73     Pulse Rate 12/25/20 0817 66     Resp 12/25/20 0817 16     Temp 12/25/20 0817 98.1 F (36.7 C)     Temp Source 12/25/20 0817 Oral     SpO2 12/25/20 0817 94 %     Weight 12/25/20 0817 280 lb (127 kg)     Height 12/25/20 0817 5\' 10"  (1.778 m)  Head Circumference --      Peak Flow --      Pain Score 12/25/20 0818 6     Pain Loc --      Pain Edu? --      Excl. in [place]? --    No data found.  Updated Vital Signs BP 134/73   Pulse 66   Temp 98.1 F (36.7 C) (Oral)   Resp 16   Ht 5\' 10"  (1.778 m)   Wt 280 lb (127 kg)   SpO2 94%   BMI 40.18 kg/m   Visual Acuity Right Eye Distance:   Left Eye Distance:   Bilateral Distance:    Right Eye Near:   Left Eye Near:    Bilateral Near:     Physical Exam General appearance: alert, Ill-appearing, no distress Head: Normocephalic, without obvious abnormality, atraumatic ENT: mucosal edema, congestion, oropharynx w/o exudate Respiratory: Respirations even , unlabored, coarse lung sound, expiratory wheeze Heart: rate and rhythm normal. No gallop or murmurs noted on exam  Abdomen: BS +, no distention, no rebound tenderness, or no mass Extremities: No gross deformities Skin: Skin color, texture, turgor normal. No rashes seen  Psych: Appropriate mood and affect. Neurologic: Mental status: Alert, oriented to person, place, and time, thought content appropriate.   UC Treatments / Results  Labs (all labs ordered are listed, but only abnormal results are displayed) Labs Reviewed - No data to display  EKG   Radiology No results found.  Procedures Procedures (including critical care time)  Medications Ordered in UC Medications - No data to display  Initial Impression / Assessment and Plan / UC Course  I have reviewed the triage vital signs and the nursing notes.  Pertinent labs & imaging results that were available  during my care of the patient were reviewed by me and considered in my medical decision making (see chart for details).     CoVID/Flu testing pending, however, suspect possible COPD Exacerbation therefore treating as such today given high-risk of respiratory decline with acute COPD exacerbation.  Treatment per medication discharge orders below. ER precautions given. Patient verbalized understanding and agreement with plan. Final Clinical Impressions(s) / UC Diagnoses   Final diagnoses:  COPD exacerbation ([place])  Shortness of breath  Sore throat (viral)     Discharge Instructions     If shortness of breath worsens go immediately to the closest emergency department for further evaluation.    ED Prescriptions    Medication Sig Dispense Auth. Provider   predniSONE (DELTASONE) 20 MG tablet Take 2 tablets (40 mg total) by mouth daily with breakfast. 10 tablet Scot Jun, FNP   benzonatate (TESSALON) 100 MG capsule Take 1-2 capsules (100-200 mg total) by mouth 3 (three) times daily as needed for cough. 40 capsule Scot Jun, FNP   doxycycline (VIBRAMYCIN) 100 MG capsule Take 1 capsule (100 mg total) by mouth 2 (two) times daily for 7 days. 14 capsule Scot Jun, FNP     PDMP not reviewed this encounter.   Scot Jun, [place] 12/31/20 [phone]

## 2020-12-25 NOTE — Telephone Encounter (Signed)
-----   Message from Rollene Rotunda, MD sent at 12/24/2020  9:25 PM EST ----- His potassium is still mildly elevated.  I would like to repeat the BMET this week.  Continue ACE inhibitor for now.  Call Mr. Fitzwater with the results and send results to Karie Schwalbe, MD

## 2020-12-25 NOTE — Telephone Encounter (Signed)
Order for BMP placed. VM left for patient to return call. Instructions released through mychart results.

## 2020-12-25 NOTE — ED Triage Notes (Signed)
Pt reports having headache and body aches x2-3days. Also sts he has a sore throat that began this morning.

## 2020-12-25 NOTE — Discharge Instructions (Addendum)
If shortness of breath worsens go immediately to the closest emergency department for further evaluation.

## 2020-12-28 LAB — COVID-19, FLU A+B NAA
Influenza A, NAA: NOT DETECTED
Influenza B, NAA: NOT DETECTED
SARS-CoV-2, NAA: NOT DETECTED

## 2021-01-01 DIAGNOSIS — E875 Hyperkalemia: Secondary | ICD-10-CM | POA: Diagnosis not present

## 2021-01-01 LAB — BASIC METABOLIC PANEL
BUN/Creatinine Ratio: 16 (ref 10–24)
BUN: 16 mg/dL (ref 8–27)
CO2: 22 mmol/L (ref 20–29)
Calcium: 9.2 mg/dL (ref 8.6–10.2)
Chloride: 101 mmol/L (ref 96–106)
Creatinine, Ser: 0.97 mg/dL (ref 0.76–1.27)
GFR calc Af Amer: 95 mL/min/{1.73_m2} (ref >59)
GFR calc non Af Amer: 82 mL/min/{1.73_m2} (ref >59)
Glucose: 115 mg/dL — ABNORMAL HIGH (ref 65–99)
Potassium: 4.8 mmol/L (ref 3.5–5.2)
Sodium: 138 mmol/L (ref 134–144)

## 2021-01-11 ENCOUNTER — Other Ambulatory Visit: Payer: Self-pay | Admitting: Internal Medicine

## 2021-01-17 DIAGNOSIS — I4819 Other persistent atrial fibrillation: Secondary | ICD-10-CM | POA: Diagnosis not present

## 2021-01-29 ENCOUNTER — Other Ambulatory Visit: Payer: Self-pay

## 2021-01-29 DIAGNOSIS — Z951 Presence of aortocoronary bypass graft: Secondary | ICD-10-CM

## 2021-01-29 DIAGNOSIS — E785 Hyperlipidemia, unspecified: Secondary | ICD-10-CM

## 2021-01-29 DIAGNOSIS — I2510 Atherosclerotic heart disease of native coronary artery without angina pectoris: Secondary | ICD-10-CM

## 2021-01-29 DIAGNOSIS — I10 Essential (primary) hypertension: Secondary | ICD-10-CM

## 2021-01-29 DIAGNOSIS — I5022 Chronic systolic (congestive) heart failure: Secondary | ICD-10-CM

## 2021-01-29 DIAGNOSIS — E1140 Type 2 diabetes mellitus with diabetic neuropathy, unspecified: Secondary | ICD-10-CM

## 2021-01-29 DIAGNOSIS — I200 Unstable angina: Secondary | ICD-10-CM

## 2021-01-29 MED ORDER — LISINOPRIL 2.5 MG PO TABS: 2.5000 mg | ORAL_TABLET | Freq: Every day | ORAL | 3 refills | Status: DC

## 2021-02-03 ENCOUNTER — Other Ambulatory Visit: Payer: Self-pay | Admitting: Internal Medicine

## 2021-02-05 DIAGNOSIS — G4733 Obstructive sleep apnea (adult) (pediatric): Secondary | ICD-10-CM | POA: Diagnosis not present

## 2021-02-13 ENCOUNTER — Ambulatory Visit
Admission: RE | Admit: 2021-02-13 | Discharge: 2021-02-13 | Disposition: A | Discharge status: Home / Self Care | Payer: BC Managed Care – PPO | Source: Ambulatory Visit | Attending: Pulmonary Disease | Admitting: Pulmonary Disease

## 2021-02-13 DIAGNOSIS — R918 Other nonspecific abnormal finding of lung field: Secondary | ICD-10-CM

## 2021-02-15 ENCOUNTER — Telehealth: Payer: Self-pay

## 2021-02-15 NOTE — Telephone Encounter (Signed)
Called and spoke with patient and scheduled ROV with NP per Dr Halford Chessman to review results for tomorrow 02/16/2021 at [place] at the [place] [facility] office. Patient agreeable to time, date and location.

## 2021-02-15 NOTE — Progress Notes (Signed)
Called and spoke to patient and scheduled ROV to review test results with NP per Dr Halford Chessman for tomorrow, 02/16/2021 at [place] at the [facility] office. Patient agreeable to time, date and location. Nothing further needed at this time.

## 2021-02-16 ENCOUNTER — Ambulatory Visit (INDEPENDENT_AMBULATORY_CARE_PROVIDER_SITE_OTHER): Payer: BC Managed Care – PPO | Admitting: Adult Health

## 2021-02-16 ENCOUNTER — Other Ambulatory Visit: Payer: Self-pay | Admitting: Internal Medicine

## 2021-02-16 ENCOUNTER — Encounter: Payer: Self-pay | Admitting: Adult Health

## 2021-02-16 ENCOUNTER — Other Ambulatory Visit: Payer: Self-pay

## 2021-02-16 ENCOUNTER — Other Ambulatory Visit: Payer: Self-pay | Admitting: Cardiology

## 2021-02-16 VITALS — BP 124/80 | HR 62 | Temp 97.1°F | Ht 70.0 in | Wt 284.8 lb

## 2021-02-16 DIAGNOSIS — R5381 Other malaise: Secondary | ICD-10-CM | POA: Insufficient documentation

## 2021-02-16 DIAGNOSIS — G4733 Obstructive sleep apnea (adult) (pediatric): Secondary | ICD-10-CM | POA: Diagnosis not present

## 2021-02-16 DIAGNOSIS — I200 Unstable angina: Secondary | ICD-10-CM

## 2021-02-16 DIAGNOSIS — R918 Other nonspecific abnormal finding of lung field: Secondary | ICD-10-CM

## 2021-02-16 DIAGNOSIS — I5022 Chronic systolic (congestive) heart failure: Secondary | ICD-10-CM

## 2021-02-16 DIAGNOSIS — J439 Emphysema, unspecified: Secondary | ICD-10-CM

## 2021-02-16 NOTE — Assessment & Plan Note (Signed)
Appears euvolemic-continue on current regimen and follow-up with cardiology 

## 2021-02-16 NOTE — Assessment & Plan Note (Signed)
Follow-up CT scan shows stable pulmonary nodules.  There is some postinflammatory scarring and subpleural reticulation.  We will follow-up a CT in 1 year.  Previous pulmonary function testing showed a normal diffusing capacity.  We will continue to follow closely. If CT is stable in 1 year.  Consider enrolling in the lung cancer screening program

## 2021-02-16 NOTE — Progress Notes (Signed)
Reviewed and agree with assessment/plan.   Chesley Mires, MD [facility] Pulmonary/Critical Care 02/16/2021, 9:56 AM Pager:  [phone]

## 2021-02-16 NOTE — Assessment & Plan Note (Signed)
General physical deconditioning.  Patient declines pulmonary rehab.  Activity as tolerated

## 2021-02-16 NOTE — Assessment & Plan Note (Signed)
Weight loss encouraged 

## 2021-02-16 NOTE — Addendum Note (Signed)
Addended by: Claudette Head A on: 02/16/2021 09:48 AM   Modules accepted: Orders

## 2021-02-16 NOTE — Patient Instructions (Addendum)
Continue on ANORO 1 puff daily , rinse after use .  Albuterol inhaler As needed   Activity as tolerated.  CT chest in 1 year follow lung nodules.  Adjust CPAP pressure 10 to 20cmH2O.  CPAP download in 1 month .  Work on healthy weight loss.  Call back if change your mind on Pulmonary rehab.  Follow up with Dr. Halford Chessman  For 6 month and As needed

## 2021-02-16 NOTE — Progress Notes (Signed)
@Patient  ID: [name], male    DOB: 1956/08/13, 65 y.o.   MRN: [id number]  Chief Complaint  Patient presents with  . Follow-up    Referring provider: Venia Carbon, MD  HPI: 65 year old male former smoker followed for obstructive sleep apnea and COPD Medical history is significant for coronary disease status post CABG in February 2021, ischemic cardiomyopathy with previous ICD, diabetes, peripheral vascular disease, congestive heart failure  TEST/EVENTS :   PFT 09/22/20 >> FEV1 2.61 (52%), FEV1% 58, TLC 6.53 (96%), RV 3.27 (145%), DLCO 114%  Chest Imaging:   CT chest 02/09/20 >> 5 nodule RUL, 3 mm nodule RML, fatty liver, atherosclerosis, calcifications in spleen  Sleep Tests:   PSG 04/02/20 >> AHI 59, SpO2 low 85%  CPAP titration 04/26/20 >> CPAP 11 cm H2O  CPAP 10/10/20 to 11/08/20 >> used on 28 of 30 nights with average 9 hrs 42 min.  Average AHI   Cardiac Tests:   Echo 02/08/20 >> EF 45 to 50%, mod LVH, grade 1 DD   02/16/2021 Follow up ; COPD and OSA  Patient presents for a 33-month follow-up.  Patient has underlying COPD.  Last visit patient was changed from Spiriva to Anoro.  Patient feels that Jearl Klinefelter is working a little bit better.  Patient has chronic shortness of breath.  Is very sedentary.  Is disabled from back and leg pain.  Patient has been unable to do rehab or physical therapy in the past due to his back and leg pain with limitations.  He denies any flare of cough or wheezing.  Patient is a former smoker.  Previous CT chest showed some small scattered pulmonary nodules.,Patient had a repeat CT chest on February 14, 2021 that showed no adenopathy  Mild emphysema, a few small solid right pulmonary nodules largest 5 mm all stable.  No new significant pulmonary nodules.  There is small parenchymal bands in both lungs compatible with nonspecific mild postinfectious/postinflammatory scarring.  There is some patchy groundglass opacities and subpleural  reticulations in the lower lobes.  No bronchiectasis or honeycombing.  We went over his CT results.  Patient has underlying obstructive sleep apnea is on nocturnal CPAP.  Patient says he tries to wear his CPAP but misses a few nights.  CPAP download shows 77% compliance.  Daily average usage at 7.5 hours.  Patient is on CPAP 11 cm H2O.  AHI is 10.5.  There is minimal mask leak.  Has a full facemask.  Patient says that he feels that his pressure is not strong enough at times.    Allergies  Allergen Reactions  . Spironolactone Other (See Comments)    hyperkalemia    Immunization History  Administered Date(s) Administered  . Influenza Split 10/26/2012  . Influenza Whole 10/13/2007, 10/16/2009, 10/17/2010  . Influenza,inj,Quad PF,6+ Mos 10/29/2013, 10/28/2014, 11/29/2015, 11/25/2016, 10/28/2017, 12/04/2018, 12/10/2019, 11/08/2020  . Moderna SARS-COV2 Booster Vaccination 12/12/2020  . Moderna Sars-Covid-2 Vaccination 03/23/2020, 04/22/2020  . Pneumococcal Conjugate-13 10/28/2014  . Pneumococcal Polysaccharide-23 10/13/2007  . Td 12/24/2005  . Tdap 12/04/2018    Past Medical History:  Diagnosis Date  . Arthritis   . Automatic implantable cardioverter-defibrillator in situ   . COPD (chronic obstructive pulmonary disease) ([place])    emphysema by CXR  . Coronary artery disease   . Diabetes mellitus    type 2  no meds  . Diverticulosis of colon   . GERD (gastroesophageal reflux disease)    with esophagitis  . History of colonic polyps   .  Hypertension   . Non-ischemic cardiomyopathy ([place])   . Presence of permanent cardiac pacemaker   . PVD (peripheral vascular disease) ([place])    bilateral common iliac artery aneurysms. Right SFA occlusion over a long segment. Left SFA disease with occlusion of the left TP trunk. Artirogram Oct. 2006  . Sleep apnea   . Tobacco abuse   . Urinary incontinence    detrussor instability    Tobacco History: Social History   Tobacco Use  Smoking  Status Former Smoker  . Packs/day: 2.50  . Years: 40.00  . Pack years: 100.00  . Types: Cigarettes  . Quit date: 01/09/2018  . Years since quitting: 3.1  Smokeless Tobacco Never Used  Tobacco Comment   GAVE 1-800-QUIT-NOW   Counseling given: Not Answered Comment: GAVE 1-800-QUIT-NOW   Outpatient Medications Prior to Visit  Medication Sig Dispense Refill  . albuterol (VENTOLIN HFA) 108 (90 Base) MCG/ACT inhaler TAKE 2 PUFFS BY MOUTH EVERY 6 HOURS AS NEEDED FOR WHEEZE OR SHORTNESS OF BREATH *USE W/ SPACER* 8.5 g 0  . aspirin 81 MG chewable tablet Chew 1 tablet (81 mg total) by mouth daily.    Marland Kitchen atorvastatin (LIPITOR) 80 MG tablet TAKE 1 TABLET (80 MG TOTAL) BY MOUTH DAILY AT 6 PM. 30 tablet 11  . benzonatate (TESSALON) 100 MG capsule Take 1-2 capsules (100-200 mg total) by mouth 3 (three) times daily as needed for cough. 40 capsule 0  . clopidogrel (PLAVIX) 75 MG tablet TAKE 1 TABLET BY MOUTH EVERY DAY 90 tablet 3  . empagliflozin (JARDIANCE) 10 MG TABS tablet Take 1 tablet (10 mg total) by mouth daily. 30 tablet 11  . furosemide (LASIX) 20 MG tablet Take 1 tablet (20 mg total) by mouth daily. 90 tablet 3  . glipiZIDE (GLUCOTROL) 5 MG tablet TAKE 1 TABLET (5 MG TOTAL) BY MOUTH 2 (TWO) TIMES DAILY BEFORE A MEAL. 180 tablet 3  . isosorbide mononitrate (IMDUR) 30 MG 24 hr tablet Take 1 Tablet in the Evening 90 tablet 3  . lisinopril (ZESTRIL) 2.5 MG tablet Take 1 tablet (2.5 mg total) by mouth daily. 90 tablet 3  . metFORMIN (GLUCOPHAGE-XR) 500 MG 24 hr tablet TAKE 2 TABLETS BY MOUTH EVERY DAY WITH BREAKFAST 180 tablet 3  . metoprolol tartrate (LOPRESSOR) 25 MG tablet Take 1 tablet (25 mg total) by mouth 2 (two) times daily. 180 tablet 3  . Multiple Vitamin (MULTIVITAMIN WITH MINERALS) TABS tablet Take 1 tablet by mouth daily.    . nitroGLYCERIN (NITROSTAT) 0.4 MG SL tablet Place 1 tablet (0.4 mg total) under the tongue every 5 (five) minutes as needed for chest pain. 25 tablet 3  .  pantoprazole (PROTONIX) 40 MG tablet TAKE 1 TABLET BY MOUTH EVERY DAY 90 tablet 3  . umeclidinium-vilanterol (ANORO ELLIPTA) 62.5-25 MCG/INH AEPB Inhale 1 puff into the lungs daily. 60 each 5  . vitamin B-12 (CYANOCOBALAMIN) 1000 MCG tablet Take 1,000 mcg by mouth daily.     . predniSONE (DELTASONE) 20 MG tablet Take 2 tablets (40 mg total) by mouth daily with breakfast. 10 tablet 0   No facility-administered medications prior to visit.     Review of Systems:   Constitutional:   No  weight loss, night sweats,  Fevers, chills,  +fatigue, or  lassitude.  HEENT:   No headaches,  Difficulty swallowing,  Tooth/dental problems, or  Sore throat,                No sneezing, itching, ear ache, nasal  congestion, post nasal drip,   CV:  No chest pain,  Orthopnea, PND, swelling in lower extremities, anasarca, dizziness, palpitations, syncope.   GI  No heartburn, indigestion, abdominal pain, nausea, vomiting, diarrhea, change in bowel habits, loss of appetite, bloody stools.   Resp:  No chest wall deformity  Skin: no rash or lesions.  GU: no dysuria, change in color of urine, no urgency or frequency.  No flank pain, no hematuria   MS:  No joint pain or swelling.  No decreased range of motion.  No back pain.    Physical Exam  BP 124/80 (BP Location: Left Arm, Cuff Size: Large)   Pulse 62   Temp (!) 97.1 F (36.2 C) (Temporal)   Ht 5\' 10"  (1.778 m)   Wt 284 lb 12.8 oz (129.2 kg)   SpO2 98%   BMI 40.86 kg/m   GEN: A/Ox3; pleasant , NAD, obese, wheelchair   HEENT:  /AT, NOSE-clear, THROAT-clear, no lesions, no postnasal drip or exudate noted.   NECK:  Supple w/ fair ROM; no JVD; normal carotid impulses w/o bruits; no thyromegaly or nodules palpated; no lymphadenopathy.    RESP  Clear  P & A; w/o, wheezes/ rales/ or rhonchi. no accessory muscle use, no dullness to percussion  CARD:  RRR, no m/r/g, no peripheral edema, pulses intact, no cyanosis or clubbing.  GI:   Soft & nt; nml  bowel sounds; no organomegaly or masses detected.   Musco: Warm bil, no deformities or joint swelling noted.   Neuro: alert, no focal deficits noted.    Skin: Warm, no lesions or rashes    Lab Results:  CBC  BMET   Imaging: CT Chest Wo Contrast  Result Date: 02/14/2021 CLINICAL DATA:  Follow-up pulmonary nodules. Dyspnea and chest pain. Former smoker. EXAM: CT CHEST WITHOUT CONTRAST TECHNIQUE: Multidetector CT imaging of the chest was performed following the standard protocol without IV contrast. COMPARISON:  02/09/2020 chest CT. FINDINGS: Cardiovascular: Top-normal heart size. No significant pericardial effusion/thickening. Three-vessel coronary atherosclerosis status post CABG. Atherosclerotic nonaneurysmal thoracic aorta. Normal caliber pulmonary arteries. Mediastinum/Nodes: No discrete thyroid nodules. Unremarkable esophagus. No pathologically enlarged axillary, mediastinal or hilar lymph nodes, noting limited sensitivity for the detection of hilar adenopathy on this noncontrast study. Lungs/Pleura: No pneumothorax. No pleural effusion. Mild centrilobular emphysema with mild diffuse bronchial wall thickening. No acute consolidative airspace disease or lung masses. A few scattered small solid right pulmonary nodules, largest 5 mm in the peripheral right upper lobe (series 8/image 49), all stable. No new significant pulmonary nodules. Scattered small parenchymal bands in both lungs compatible with nonspecific mild postinfectious/postinflammatory scarring. Patchy ground-glass opacity and subpleural reticulation in the lower lobes with associated volume loss. No significant bronchiectasis or frank honeycombing. Upper abdomen: Scattered granulomatous splenic calcifications are unchanged. Musculoskeletal: No aggressive appearing focal osseous lesions. Discontinuity in the lower most sternotomy wire. Otherwise intact sternotomy wires. Marked thoracic spondylosis. IMPRESSION: 1. Scattered small solid  right pulmonary nodules, largest 5 mm, all stable, considered benign. 2. Spectrum of findings at the lung bases including patchy ground-glass opacity and subpleural reticulation that may indicate an interstitial lung disease with superimposed hypoventilatory change. UIP not excluded. Suggest further evaluation with high-resolution chest CT study in 6-12 months, as clinically warranted. 3. Mild centrilobular emphysema with mild diffuse bronchial wall thickening, suggesting COPD. 4. Aortic Atherosclerosis (ICD10-I70.0) and Emphysema (ICD10-J43.9). Electronically Signed   By: Ilona Sorrel M.D.   On: 02/14/2021 08:20      PFT Results Latest Ref Rng &  Units 09/22/2020 02/09/2020  FVC-Pre L 3.05 2.68  FVC-Predicted Pre % 67 59  FVC-Post L 3.48 -  FVC-Predicted Post % 77 -  Pre FEV1/FVC % % 58 77  Post FEV1/FCV % % 72 -  FEV1-Pre L 1.77 2.07  FEV1-Predicted Pre % 52 61  FEV1-Post L 2.52 -  DLCO uncorrected ml/min/mmHg 30.11 -  DLCO UNC% % 114 -  DLCO corrected ml/min/mmHg 30.11 -  DLCO COR %Predicted % 114 -  DLVA Predicted % 118 -  TLC L 6.53 -  TLC % Predicted % 96 -  RV % Predicted % 145 -    No results found for: NITRICOXIDE      Assessment & Plan:   No problem-specific Assessment & Plan notes found for this encounter.     Rexene Edison, NP 02/16/2021

## 2021-02-16 NOTE — Assessment & Plan Note (Signed)
Severe obstructive sleep apnea-encouraged on CPAP compliance.  Will adjust CPAP pressure to AutoSet to see if we can decrease number of events improved comfort.  Change to AutoSet 10 to 20 cm H2O.  We will get a CPAP download and 1 month  Plan  Patient Instructions  Continue on ANORO 1 puff daily , rinse after use .  Albuterol inhaler As needed   Activity as tolerated.  CT chest in 1 year follow lung nodules.  Adjust CPAP pressure 10 to 20cmH2O.  CPAP download in 1 month .  Work on healthy weight loss.  Call back if change your mind on Pulmonary rehab.  Follow up with Dr. Halford Chessman  For 6 month and As needed

## 2021-02-16 NOTE — Assessment & Plan Note (Signed)
Moderate to severe COPD with emphysema appears stable.  Perceived clinical benefit with on Norco. Patient declines pulmonary rehab.  Plan  Patient Instructions  Continue on ANORO 1 puff daily , rinse after use .  Albuterol inhaler As needed   Activity as tolerated.  CT chest in 1 year follow lung nodules.  Adjust CPAP pressure 10 to 20cmH2O.  CPAP download in 1 month .  Work on healthy weight loss.  Call back if change your mind on Pulmonary rehab.  Follow up with Dr. Halford Chessman  For 6 month and As needed

## 2021-02-17 DIAGNOSIS — I4819 Other persistent atrial fibrillation: Secondary | ICD-10-CM | POA: Diagnosis not present

## 2021-03-17 DIAGNOSIS — I4819 Other persistent atrial fibrillation: Secondary | ICD-10-CM | POA: Diagnosis not present

## 2021-03-21 ENCOUNTER — Ambulatory Visit (INDEPENDENT_AMBULATORY_CARE_PROVIDER_SITE_OTHER): Payer: BC Managed Care – PPO | Admitting: Internal Medicine

## 2021-03-21 ENCOUNTER — Encounter: Payer: Self-pay | Admitting: Internal Medicine

## 2021-03-21 ENCOUNTER — Other Ambulatory Visit: Payer: Self-pay

## 2021-03-21 VITALS — BP 138/78 | HR 69 | Temp 96.5°F | Ht 70.0 in | Wt 289.0 lb

## 2021-03-21 DIAGNOSIS — H6123 Impacted cerumen, bilateral: Secondary | ICD-10-CM | POA: Insufficient documentation

## 2021-03-21 DIAGNOSIS — R310 Gross hematuria: Secondary | ICD-10-CM | POA: Diagnosis not present

## 2021-03-21 DIAGNOSIS — I200 Unstable angina: Secondary | ICD-10-CM

## 2021-03-21 LAB — POC URINALSYSI DIPSTICK (AUTOMATED)
Bilirubin, UA: NEGATIVE
Glucose, UA: POSITIVE — AB
Ketones, UA: NEGATIVE
Nitrite, UA: POSITIVE
Protein, UA: POSITIVE — AB
Spec Grav, UA: 1.025 (ref 1.010–1.025)
Urobilinogen, UA: 0.2 E.U./dL
pH, UA: 6 (ref 5.0–8.0)

## 2021-03-21 MED ORDER — SULFAMETHOXAZOLE-TRIMETHOPRIM 800-160 MG PO TABS: 1.0000 | ORAL_TABLET | Freq: Two times a day (BID) | ORAL | 0 refills | Status: DC

## 2021-03-21 NOTE — Assessment & Plan Note (Signed)
Not obstructing but will clean out since getting hearing aides and want them cleared

## 2021-03-21 NOTE — Assessment & Plan Note (Addendum)
No pain or signs of infection Past stone but this seems unlikely Long time smoker  Urinalysis does show 2+ leukocytes No trouble emptying  Will treat with septra If culture is negative, will send to urology (or if there is any recurrence---[place] or [facility] both fine)

## 2021-03-21 NOTE — Addendum Note (Signed)
Addended by: Pilar Grammes on: 03/21/2021 08:37 AM   Modules accepted: Orders

## 2021-03-21 NOTE — Progress Notes (Signed)
Subjective:    Patient ID: [name], male    DOB: Apr 27, 1956, 65 y.o.   MRN: [id number]  HPI  Here to get his ears cleaned out This visit occurred during the SARS-CoV-2 public health emergency.  Safety protocols were in place, including screening questions prior to the visit, additional usage of staff PPE, and extensive cleaning of exam room while observing appropriate contact time as indicated for disinfecting solutions.   Went to [employer] for hearing aides Told he has wax buildup and needs them cleared Some pain on left and tinnitus Has tried drops in the past but no help  Marked hearing loss  Also---had gross hematuria this morning---first void None on second one No change in chronic back pain--did have a kidney stone in the past No dysuria  Current Outpatient Medications on File Prior to Visit  Medication Sig Dispense Refill  . albuterol (VENTOLIN HFA) 108 (90 Base) MCG/ACT inhaler TAKE 2 PUFFS BY MOUTH EVERY 6 HOURS AS NEEDED FOR WHEEZE OR SHORTNESS OF BREATH *USE W/ SPACER* 8.5 each 1  . aspirin 81 MG chewable tablet Chew 1 tablet (81 mg total) by mouth daily.    Marland Kitchen atorvastatin (LIPITOR) 80 MG tablet TAKE 1 TABLET (80 MG TOTAL) BY MOUTH DAILY AT 6 PM. 30 tablet 11  . clopidogrel (PLAVIX) 75 MG tablet TAKE 1 TABLET BY MOUTH EVERY DAY 90 tablet 3  . empagliflozin (JARDIANCE) 10 MG TABS tablet Take 1 tablet (10 mg total) by mouth daily. 30 tablet 11  . furosemide (LASIX) 20 MG tablet Take 1 tablet (20 mg total) by mouth daily. 90 tablet 3  . glipiZIDE (GLUCOTROL) 5 MG tablet TAKE 1 TABLET (5 MG TOTAL) BY MOUTH 2 (TWO) TIMES DAILY BEFORE A MEAL. 180 tablet 3  . isosorbide mononitrate (IMDUR) 30 MG 24 hr tablet Take 1 Tablet in the Evening 90 tablet 3  . lisinopril (ZESTRIL) 2.5 MG tablet Take 1 tablet (2.5 mg total) by mouth daily. 90 tablet 3  . metFORMIN (GLUCOPHAGE-XR) 500 MG 24 hr tablet TAKE 2 TABLETS BY MOUTH EVERY DAY WITH BREAKFAST 180 tablet 3  . metoprolol  tartrate (LOPRESSOR) 25 MG tablet Take 1 tablet (25 mg total) by mouth 2 (two) times daily. 180 tablet 3  . Multiple Vitamin (MULTIVITAMIN WITH MINERALS) TABS tablet Take 1 tablet by mouth daily.    . pantoprazole (PROTONIX) 40 MG tablet TAKE 1 TABLET BY MOUTH EVERY DAY 90 tablet 3  . umeclidinium-vilanterol (ANORO ELLIPTA) 62.5-25 MCG/INH AEPB Inhale 1 puff into the lungs daily. 60 each 5  . vitamin B-12 (CYANOCOBALAMIN) 1000 MCG tablet Take 1,000 mcg by mouth daily.    . nitroGLYCERIN (NITROSTAT) 0.4 MG SL tablet Place 1 tablet (0.4 mg total) under the tongue every 5 (five) minutes as needed for chest pain. 25 tablet 3   No current facility-administered medications on file prior to visit.    Allergies  Allergen Reactions  . Spironolactone Other (See Comments)    hyperkalemia    Past Medical History:  Diagnosis Date  . Arthritis   . Automatic implantable cardioverter-defibrillator in situ   . COPD (chronic obstructive pulmonary disease) ([place])    emphysema by CXR  . Coronary artery disease   . Diabetes mellitus    type 2  no meds  . Diverticulosis of colon   . GERD (gastroesophageal reflux disease)    with esophagitis  . History of colonic polyps   . Hypertension   . Non-ischemic cardiomyopathy ([place])   .  Presence of permanent cardiac pacemaker   . PVD (peripheral vascular disease) ([place])    bilateral common iliac artery aneurysms. Right SFA occlusion over a long segment. Left SFA disease with occlusion of the left TP trunk. Artirogram Oct. 2006  . Sleep apnea   . Tobacco abuse   . Urinary incontinence    detrussor instability    Past Surgical History:  Procedure Laterality Date  . ABDOMINAL AORTIC ENDOVASCULAR STENT GRAFT N/A 05/07/2018   Procedure: ABDOMINAL AORTIC ENDOVASCULAR STENT GRAFT;  Surgeon: Conrad [place], MD;  Location: [facility];  Service: Vascular;  Laterality: N/A;  . CARDIAC CATHETERIZATION  06/2004   negative  . CARDIAC CATHETERIZATION  02/08/2020  . CLIPPING  OF ATRIAL APPENDAGE N/A 02/11/2020   Procedure: Clipping Of Atrial Appendage using AtriCure 35 MM AtriClip.;  Surgeon: Wonda Olds, MD;  Location: [facility];  Service: Open Heart Surgery;  Laterality: N/A;  . COLONOSCOPY  04/2005  . COPD exacerbation    . CORONARY ANGIOPLASTY  01/09/2018  . CORONARY ARTERY BYPASS GRAFT N/A 02/11/2020   Procedure: CORONARY ARTERY BYPASS GRAFTING (CABG) using LIMA to LAD; RIMA to PL; Endoscopic right greater saphenous vein harvest: SVC to Diag; SVC to OM1.;  Surgeon: Wonda Olds, MD;  Location: [facility];  Service: Open Heart Surgery;  Laterality: N/A;  BILATERAL IMA  . CORONARY STENT INTERVENTION N/A 01/10/2018   Procedure: CORONARY STENT INTERVENTION;  Surgeon: Lorretta Harp, MD;  Location: [facility];  Service: Cardiovascular;  Laterality: N/A;  . CORONARY/GRAFT ACUTE MI REVASCULARIZATION N/A 01/10/2018   Procedure: Coronary/Graft Acute MI Revascularization;  Surgeon: Lorretta Harp, MD;  Location: [facility];  Service: Cardiovascular;  Laterality: N/A;  . EMBOLIZATION Left 03/19/2018  . EMBOLIZATION Left 03/19/2018   Procedure: EMBOLIZATION - Left Internal;  Surgeon: Conrad [place], MD;  Location: [facility];  Service: Cardiovascular;  Laterality: Left;  . EMBOLIZATION Right 04/16/2018   Procedure: EMBOLIZATION;  Surgeon: Conrad [place], MD;  Location: [facility];  Service: Cardiovascular;  Laterality: Right;  . ENDOVEIN HARVEST OF GREATER SAPHENOUS VEIN Right 02/11/2020   Procedure: [place] Of Greater Saphenous Vein using right lower extremity.;  Surgeon: Wonda Olds, MD;  Location: [facility] OR;  Service: Open Heart Surgery;  Laterality: Right;  . FEMORAL ARTERY EXPLORATION Right 05/07/2018   Procedure: FEMORAL ARTERY EXPLORATION, EXTENDED PROFUNDAPLASTY;  Surgeon: Conrad [place], MD;  Location: [facility];  Service: Vascular;  Laterality: Right;  . INTRAOPERATIVE ARTERIOGRAM Right 05/07/2018   Procedure: INTRA OPERATIVE  ARTERIOGRAM;  Surgeon: Conrad Henlawson, MD;  Location: [facility];  Service: Vascular;  Laterality: Right;  . LEFT HEART CATH AND CORONARY ANGIOGRAPHY N/A 01/10/2018   Procedure: LEFT HEART CATH AND CORONARY ANGIOGRAPHY;  Surgeon: Lorretta Harp, MD;  Location: [facility];  Service: Cardiovascular;  Laterality: N/A;  . LEFT HEART CATH AND CORONARY ANGIOGRAPHY N/A 02/08/2020   Procedure: LEFT HEART CATH AND CORONARY ANGIOGRAPHY;  Surgeon: [place] Sine, MD;  Location: [facility];  Service: Cardiovascular;  Laterality: N/A;  . PACEMAKER INSERTION  11/2005  . PACEMAKER LEAD REMOVAL N/A 10/17/2014   Procedure: PACEMAKER LEAD REMOVAL;  Surgeon: [place] Lance, MD;  Location: [facility];  Service: Cardiovascular;  Laterality: N/A;  . RENAL ANGIOGRAPHY Left 04/16/2018   Procedure: RENAL ANGIOGRAPHY;  Surgeon: Conrad [place], MD;  Location: [facility];  Service: Cardiovascular;  Laterality: Left;  . s/p ICD placement  Medtronic Maximo [phone] single chamber  . TEE WITHOUT CARDIOVERSION N/A 02/11/2020   Procedure: TRANSESOPHAGEAL ECHOCARDIOGRAM (TEE);  Surgeon: Wonda Olds, MD;  Location: [facility];  Service: Open Heart Surgery;  Laterality: N/A;    Family History  Problem Relation Age of Onset  . Stroke Father   . Coronary artery disease Maternal Aunt   . Heart failure Maternal Aunt   . Lung cancer Maternal Aunt   . Lung cancer Maternal Uncle   . Cancer Brother        Mouth  . Cancer Sister        throat    Social History   Socioeconomic History  . Marital status: Married    Spouse name: Not on file  . Number of children: 2  . Years of education: Not on file  . Highest education level: Not on file  Occupational History  . Occupation: Grave digger--now disabled  Tobacco Use  . Smoking status: Former Smoker    Packs/day: 2.50    Years: 40.00    Pack years: 100.00    Types: Cigarettes    Quit date: 01/09/2018    Years since quitting: 3.1  . Smokeless tobacco: Never  Used  . Tobacco comment: GAVE 1-800-QUIT-NOW  Vaping Use  . Vaping Use: Never used  Substance and Sexual Activity  . Alcohol use: No  . Drug use: No  . Sexual activity: Not Currently  Other Topics Concern  . Not on file  Social History Narrative   No living will   Requests wife as health care POA   Would accept resuscitation but doesn't want prolonged ventilation   No tube feeds if cognitively unaware   Social Determinants of Health   Financial Resource Strain: Not on file  Food Insecurity: Not on file  Transportation Needs: Not on file  Physical Activity: Not on file  Stress: Not on file  Social Connections: Not on file  Intimate Partner Violence: Not on file   Review of Systems  No illness No fever     Objective:   Physical Exam Constitutional:      Appearance: Normal appearance.  HENT:     Ears:     Comments: Canal about 1/2 obstructed with cerumen bilaterally TMs look okay Abdominal:     Palpations: Abdomen is soft.     Tenderness: There is no abdominal tenderness.  Neurological:     Mental Status: He is alert.            Assessment & Plan:

## 2021-03-23 LAB — URINE CULTURE
MICRO NUMBER:: 11711266
SPECIMEN QUALITY:: ADEQUATE

## 2021-03-24 ENCOUNTER — Other Ambulatory Visit: Payer: Self-pay | Admitting: Internal Medicine

## 2021-03-24 DIAGNOSIS — R310 Gross hematuria: Secondary | ICD-10-CM

## 2021-04-09 ENCOUNTER — Other Ambulatory Visit: Payer: Self-pay

## 2021-04-09 MED ORDER — METOPROLOL TARTRATE 25 MG PO TABS: 25.0000 mg | ORAL_TABLET | Freq: Two times a day (BID) | ORAL | 3 refills | Status: DC

## 2021-04-19 ENCOUNTER — Other Ambulatory Visit: Payer: Self-pay | Admitting: Internal Medicine

## 2021-05-02 DIAGNOSIS — R351 Nocturia: Secondary | ICD-10-CM | POA: Diagnosis not present

## 2021-05-02 DIAGNOSIS — N3281 Overactive bladder: Secondary | ICD-10-CM | POA: Diagnosis not present

## 2021-05-02 DIAGNOSIS — R310 Gross hematuria: Secondary | ICD-10-CM | POA: Diagnosis not present

## 2021-05-02 DIAGNOSIS — N3021 Other chronic cystitis with hematuria: Secondary | ICD-10-CM | POA: Diagnosis not present

## 2021-05-09 ENCOUNTER — Other Ambulatory Visit: Payer: BC Managed Care – PPO

## 2021-05-09 ENCOUNTER — Encounter: Payer: Self-pay | Admitting: Internal Medicine

## 2021-05-09 ENCOUNTER — Other Ambulatory Visit: Payer: Self-pay

## 2021-05-09 ENCOUNTER — Ambulatory Visit (INDEPENDENT_AMBULATORY_CARE_PROVIDER_SITE_OTHER): Payer: BC Managed Care – PPO | Admitting: Internal Medicine

## 2021-05-09 VITALS — BP 134/86 | HR 74 | Temp 97.6°F | Ht 70.0 in | Wt 283.0 lb

## 2021-05-09 DIAGNOSIS — J439 Emphysema, unspecified: Secondary | ICD-10-CM | POA: Diagnosis not present

## 2021-05-09 DIAGNOSIS — I5022 Chronic systolic (congestive) heart failure: Secondary | ICD-10-CM

## 2021-05-09 DIAGNOSIS — I714 Abdominal aortic aneurysm, without rupture, unspecified: Secondary | ICD-10-CM

## 2021-05-09 DIAGNOSIS — F39 Unspecified mood [affective] disorder: Secondary | ICD-10-CM

## 2021-05-09 DIAGNOSIS — E1140 Type 2 diabetes mellitus with diabetic neuropathy, unspecified: Secondary | ICD-10-CM | POA: Diagnosis not present

## 2021-05-09 DIAGNOSIS — Z0000 Encounter for general adult medical examination without abnormal findings: Secondary | ICD-10-CM | POA: Diagnosis not present

## 2021-05-09 DIAGNOSIS — E538 Deficiency of other specified B group vitamins: Secondary | ICD-10-CM

## 2021-05-09 DIAGNOSIS — I25119 Atherosclerotic heart disease of native coronary artery with unspecified angina pectoris: Secondary | ICD-10-CM | POA: Diagnosis not present

## 2021-05-09 DIAGNOSIS — I480 Paroxysmal atrial fibrillation: Secondary | ICD-10-CM

## 2021-05-09 DIAGNOSIS — I739 Peripheral vascular disease, unspecified: Secondary | ICD-10-CM

## 2021-05-09 DIAGNOSIS — I200 Unstable angina: Secondary | ICD-10-CM

## 2021-05-09 DIAGNOSIS — M791 Myalgia, unspecified site: Secondary | ICD-10-CM | POA: Insufficient documentation

## 2021-05-09 LAB — HEPATIC FUNCTION PANEL
ALT: 18 U/L (ref 0–53)
AST: 21 U/L (ref 0–37)
Albumin: 4.5 g/dL (ref 3.5–5.2)
Alkaline Phosphatase: 85 U/L (ref 39–117)
Bilirubin, Direct: 0.2 mg/dL (ref 0.0–0.3)
Total Bilirubin: 1 mg/dL (ref 0.2–1.2)
Total Protein: 7.1 g/dL (ref 6.0–8.3)

## 2021-05-09 LAB — CBC
HCT: 37.6 % — ABNORMAL LOW (ref 39.0–52.0)
Hemoglobin: 12.4 g/dL — ABNORMAL LOW (ref 13.0–17.0)
MCHC: 33.1 g/dL (ref 30.0–36.0)
MCV: 65.3 fl — ABNORMAL LOW (ref 78.0–100.0)
Platelets: 305 10*3/uL (ref 150.0–400.0)
RBC: 5.75 Mil/uL (ref 4.22–5.81)
RDW: 20.1 % — ABNORMAL HIGH (ref 11.5–15.5)
WBC: 7.3 10*3/uL (ref 4.0–10.5)

## 2021-05-09 LAB — LIPID PANEL
Cholesterol: 113 mg/dL (ref 0–200)
HDL: 36.3 mg/dL — ABNORMAL LOW (ref >39.00)
NonHDL: 76.41
Total CHOL/HDL Ratio: 3
Triglycerides: 308 mg/dL — ABNORMAL HIGH (ref 0.0–149.0)
VLDL: 61.6 mg/dL — ABNORMAL HIGH (ref 0.0–40.0)

## 2021-05-09 LAB — RENAL FUNCTION PANEL
Albumin: 4.5 g/dL (ref 3.5–5.2)
BUN: 16 mg/dL (ref 6–23)
CO2: 25 mEq/L (ref 19–32)
Calcium: 9.5 mg/dL (ref 8.4–10.5)
Chloride: 100 mEq/L (ref 96–112)
Creatinine, Ser: 0.91 mg/dL (ref 0.40–1.50)
GFR: 89.09 mL/min (ref >60.00)
Glucose, Bld: 117 mg/dL — ABNORMAL HIGH (ref 70–99)
Phosphorus: 3.8 mg/dL (ref 2.3–4.6)
Potassium: 4.5 mEq/L (ref 3.5–5.1)
Sodium: 136 mEq/L (ref 135–145)

## 2021-05-09 LAB — LDL CHOLESTEROL, DIRECT: Direct LDL: 24 mg/dL

## 2021-05-09 LAB — CK: Total CK: 131 U/L (ref 7–232)

## 2021-05-09 LAB — HM DIABETES FOOT EXAM

## 2021-05-09 LAB — T4, FREE: Free T4: 0.89 ng/dL (ref 0.60–1.60)

## 2021-05-09 LAB — SEDIMENTATION RATE: Sed Rate: 20 mm/hr (ref 0–20)

## 2021-05-09 LAB — VITAMIN B12: Vitamin B-12: 1050 pg/mL — ABNORMAL HIGH (ref 211–911)

## 2021-05-09 NOTE — Progress Notes (Signed)
Subjective:    Patient ID: [name], male    DOB: 03/24/1956, 65 y.o.   MRN: [id number]  HPI Here for Medicare wellness visit and follow up of chronic health conditions This visit occurred during the SARS-CoV-2 public health emergency.  Safety protocols were in place, including screening questions prior to the visit, additional usage of staff PPE, and extensive cleaning of exam room while observing appropriate contact time as indicated for disinfecting solutions.   Reviewed advanced directives Reviewed other doctors---Dr Kirsteins--physiatry, Dr Ninfa Linden-- ortho, Dr Hochrein--cardiology, Drs Vinnie Langton, Dr Alger Memos surgery, Dr Wrenn--urology (having reevaluation soon) No tobacco No alcohol Not able to exercise Multiple falls but no injuries Not really depressed. Not anhedonic Mows lawn--riding. Wife does weedeating. Wife does all instrumental ADLs otherwise. Able to do his own ADLs---but with some difficulty Mild memory issues--not worrisome  Having bad back and leg pains Falling now Hasn't seen specialist since COVID---needs reevaluation Knees are better Does walk with cane----but gets up and just falls---"like I don't have a leg" Bad cramps at times--legs/hands/arms  Not checking sugars No low sugar reactions Toes "feel like pins stuck in them" Sleeps with CPAP--but still tosses and turns some  No chest pain No palpitations Occasional dizziness---no syncope (not causing falls) No edema Sleeps in hospital bed--sometimes flat or head up. No PND  Easy dyspnea---SOB even walking short distances or taking shower Some cough Not wheezing Uses rescue inhaler at times  BMI still 40  No regular anxiety  No heartburn No dysphagia  Current Outpatient Medications on File Prior to Visit  Medication Sig Dispense Refill  . albuterol (VENTOLIN HFA) 108 (90 Base) MCG/ACT inhaler TAKE 2 PUFFS BY MOUTH EVERY 6 HOURS AS NEEDED FOR WHEEZE OR SHORTNESS OF  BREATH *USE W/ SPACER* 8.5 each 0  . aspirin 81 MG chewable tablet Chew 1 tablet (81 mg total) by mouth daily.    Marland Kitchen atorvastatin (LIPITOR) 80 MG tablet TAKE 1 TABLET (80 MG TOTAL) BY MOUTH DAILY AT 6 PM. 30 tablet 11  . clopidogrel (PLAVIX) 75 MG tablet TAKE 1 TABLET BY MOUTH EVERY DAY 90 tablet 3  . empagliflozin (JARDIANCE) 10 MG TABS tablet Take 1 tablet (10 mg total) by mouth daily. 30 tablet 11  . furosemide (LASIX) 20 MG tablet Take 1 tablet (20 mg total) by mouth daily. 90 tablet 3  . glipiZIDE (GLUCOTROL) 5 MG tablet TAKE 1 TABLET (5 MG TOTAL) BY MOUTH 2 (TWO) TIMES DAILY BEFORE A MEAL. 180 tablet 3  . isosorbide mononitrate (IMDUR) 30 MG 24 hr tablet Take 1 Tablet in the Evening 90 tablet 3  . metFORMIN (GLUCOPHAGE-XR) 500 MG 24 hr tablet TAKE 2 TABLETS BY MOUTH EVERY DAY WITH BREAKFAST 180 tablet 3  . metoprolol tartrate (LOPRESSOR) 25 MG tablet Take 1 tablet (25 mg total) by mouth 2 (two) times daily. 180 tablet 3  . Multiple Vitamin (MULTIVITAMIN WITH MINERALS) TABS tablet Take 1 tablet by mouth daily.    . pantoprazole (PROTONIX) 40 MG tablet TAKE 1 TABLET BY MOUTH EVERY DAY 90 tablet 3  . umeclidinium-vilanterol (ANORO ELLIPTA) 62.5-25 MCG/INH AEPB Inhale 1 puff into the lungs daily. 60 each 5  . vitamin B-12 (CYANOCOBALAMIN) 1000 MCG tablet Take 1,000 mcg by mouth daily.    Marland Kitchen doxycycline (VIBRA-TABS) 100 MG tablet Take 100 mg by mouth 2 (two) times daily.    Marland Kitchen lisinopril (ZESTRIL) 2.5 MG tablet Take 1 tablet (2.5 mg total) by mouth daily. 90 tablet 3  . nitroGLYCERIN (NITROSTAT)  0.4 MG SL tablet Place 1 tablet (0.4 mg total) under the tongue every 5 (five) minutes as needed for chest pain. 25 tablet 3   No current facility-administered medications on file prior to visit.    Allergies  Allergen Reactions  . Spironolactone Other (See Comments)    hyperkalemia    Past Medical History:  Diagnosis Date  . Arthritis   . Automatic implantable cardioverter-defibrillator in situ    . COPD (chronic obstructive pulmonary disease) ([place])    emphysema by CXR  . Coronary artery disease   . Diabetes mellitus    type 2  no meds  . Diverticulosis of colon   . GERD (gastroesophageal reflux disease)    with esophagitis  . History of colonic polyps   . Hypertension   . Non-ischemic cardiomyopathy ([place])   . Presence of permanent cardiac pacemaker   . PVD (peripheral vascular disease) ([place])    bilateral common iliac artery aneurysms. Right SFA occlusion over a long segment. Left SFA disease with occlusion of the left TP trunk. Artirogram Oct. 2006  . Sleep apnea   . Tobacco abuse   . Urinary incontinence    detrussor instability    Past Surgical History:  Procedure Laterality Date  . ABDOMINAL AORTIC ENDOVASCULAR STENT GRAFT N/A 05/07/2018   Procedure: ABDOMINAL AORTIC ENDOVASCULAR STENT GRAFT;  Surgeon: Conrad [place], MD;  Location: [place];  Service: Vascular;  Laterality: N/A;  . CARDIAC CATHETERIZATION  06/2004   negative  . CARDIAC CATHETERIZATION  02/08/2020  . CLIPPING OF ATRIAL APPENDAGE N/A 02/11/2020   Procedure: Clipping Of Atrial Appendage using AtriCure 35 MM AtriClip.;  Surgeon: Wonda Olds, MD;  Location: [facility];  Service: Open Heart Surgery;  Laterality: N/A;  . COLONOSCOPY  04/2005  . COPD exacerbation    . CORONARY ANGIOPLASTY  01/09/2018  . CORONARY ARTERY BYPASS GRAFT N/A 02/11/2020   Procedure: CORONARY ARTERY BYPASS GRAFTING (CABG) using LIMA to LAD; RIMA to PL; Endoscopic right greater saphenous vein harvest: SVC to Diag; SVC to OM1.;  Surgeon: Wonda Olds, MD;  Location: [facility];  Service: Open Heart Surgery;  Laterality: N/A;  BILATERAL IMA  . CORONARY STENT INTERVENTION N/A 01/10/2018   Procedure: CORONARY STENT INTERVENTION;  Surgeon: Lorretta Harp, MD;  Location: [facility];  Service: Cardiovascular;  Laterality: N/A;  . CORONARY/GRAFT ACUTE MI REVASCULARIZATION N/A 01/10/2018   Procedure: Coronary/Graft Acute MI  Revascularization;  Surgeon: Lorretta Harp, MD;  Location: [facility];  Service: Cardiovascular;  Laterality: N/A;  . EMBOLIZATION Left 03/19/2018  . EMBOLIZATION Left 03/19/2018   Procedure: EMBOLIZATION - Left Internal;  Surgeon: Conrad Augusta, MD;  Location: [facility] [place] [facility];  Service: Cardiovascular;  Laterality: Left;  . EMBOLIZATION Right 04/16/2018   Procedure: EMBOLIZATION;  Surgeon: Conrad Delaplaine, MD;  Location: [facility];  Service: Cardiovascular;  Laterality: Right;  . ENDOVEIN HARVEST OF GREATER SAPHENOUS VEIN Right 02/11/2020   Procedure: [place] Of Greater Saphenous Vein using right lower extremity.;  Surgeon: Wonda Olds, MD;  Location: [facility];  Service: Open Heart Surgery;  Laterality: Right;  . FEMORAL ARTERY EXPLORATION Right 05/07/2018   Procedure: FEMORAL ARTERY EXPLORATION, EXTENDED PROFUNDAPLASTY;  Surgeon: Conrad Taylor, MD;  Location: [facility] [facility];  Service: Vascular;  Laterality: Right;  . INTRAOPERATIVE ARTERIOGRAM Right 05/07/2018   Procedure: INTRA OPERATIVE ARTERIOGRAM;  Surgeon: Conrad , MD;  Location: [facility];  Service: Vascular;  Laterality: Right;  .  LEFT HEART CATH AND CORONARY ANGIOGRAPHY N/A 01/10/2018   Procedure: LEFT HEART CATH AND CORONARY ANGIOGRAPHY;  Surgeon: Lorretta Harp, MD;  Location: [place] [facility];  Service: Cardiovascular;  Laterality: N/A;  . LEFT HEART CATH AND CORONARY ANGIOGRAPHY N/A 02/08/2020   Procedure: LEFT HEART CATH AND CORONARY ANGIOGRAPHY;  Surgeon: Troy Sine, MD;  Location: [place] [facility];  Service: Cardiovascular;  Laterality: N/A;  . PACEMAKER INSERTION  11/2005  . PACEMAKER LEAD REMOVAL N/A 10/17/2014   Procedure: PACEMAKER LEAD REMOVAL;  Surgeon: Evans Lance, MD;  Location: [facility];  Service: Cardiovascular;  Laterality: N/A;  . RENAL ANGIOGRAPHY Left 04/16/2018   Procedure: RENAL ANGIOGRAPHY;  Surgeon: Conrad Garceno, MD;  Location: [facility] [facility];  Service: Cardiovascular;   Laterality: Left;  . s/p ICD placement      Medtronic Maximo #7232 single chamber  . TEE WITHOUT CARDIOVERSION N/A 02/11/2020   Procedure: TRANSESOPHAGEAL ECHOCARDIOGRAM (TEE);  Surgeon: Wonda Olds, MD;  Location: [facility];  Service: Open Heart Surgery;  Laterality: N/A;    Family History  Problem Relation Age of Onset  . Stroke Father   . Coronary artery disease Maternal Aunt   . Heart failure Maternal Aunt   . Lung cancer Maternal Aunt   . Lung cancer Maternal Uncle   . Cancer Brother        Mouth  . Cancer Sister        throat    Social History   Socioeconomic History  . Marital status: Married    Spouse name: Not on file  . Number of children: 2  . Years of education: Not on file  . Highest education level: Not on file  Occupational History  . Occupation: Grave digger--now disabled  Tobacco Use  . Smoking status: Former Smoker    Packs/day: 2.50    Years: 40.00    Pack years: 100.00    Types: Cigarettes    Quit date: 01/09/2018    Years since quitting: 3.3  . Smokeless tobacco: Never Used  . Tobacco comment: GAVE 1-800-QUIT-NOW  Vaping Use  . Vaping Use: Never used  Substance and Sexual Activity  . Alcohol use: No  . Drug use: No  . Sexual activity: Not Currently  Other Topics Concern  . Not on file  Social History Narrative   No living will   Requests wife as health care POA   Would accept resuscitation but doesn't want prolonged ventilation   No tube feeds if cognitively unaware   Social Determinants of Health   Financial Resource Strain: Not on file  Food Insecurity: Not on file  Transportation Needs: Not on file  Physical Activity: Not on file  Stress: Not on file  Social Connections: Not on file  Intimate Partner Violence: Not on file   Review of Systems No teeth---doesn't see dentist Appetite is fine Weight fairly stable Wears seat belt Bowels move okay---no blood Voids with reasonable stream now No suspicious skin lesions     Objective:   Physical Exam Constitutional:      Appearance: He is obese.  HENT:     Mouth/Throat:     Comments: No lesions Eyes:     Conjunctiva/sclera: Conjunctivae normal.     Pupils: Pupils are equal, round, and reactive to light.  Cardiovascular:     Rate and Rhythm: Normal rate and regular rhythm.     Heart sounds: No murmur heard. No gallop.      Comments:  Faint pulse in left foot--absent in right Pulmonary:     Effort: Pulmonary effort is normal.     Breath sounds: Normal breath sounds. No wheezing or rales.  Abdominal:     Palpations: Abdomen is soft.     Tenderness: There is no abdominal tenderness.  Musculoskeletal:     Cervical back: Neck supple.     Right lower leg: No edema.     Left lower leg: No edema.  Lymphadenopathy:     Cervical: No cervical adenopathy.  Skin:    General: Skin is warm.     Findings: No rash.  Neurological:     Mental Status: He is alert and oriented to person, place, and time.     Comments: Regino Bellow, Obama" [phone] [id number] Recall 3/3  Mildly decreased sensation in feet  Psychiatric:        Mood and Affect: Mood normal.        Behavior: Behavior normal.            Assessment & Plan:

## 2021-05-09 NOTE — Progress Notes (Signed)
Hearing Screening   125Hz 250Hz 500Hz 1000Hz 2000Hz 3000Hz 4000Hz 6000Hz 8000Hz  Right ear:           Left ear:           Comments: Has hearing aids. Wearing them today.  Vision Screening Comments: March 2022   

## 2021-05-09 NOTE — Assessment & Plan Note (Signed)
Frustrated by disability but not really depressed No meds indicated

## 2021-05-09 NOTE — Assessment & Plan Note (Signed)
Has had stents and grafts Not able to walk enough to have claudication

## 2021-05-09 NOTE — Assessment & Plan Note (Signed)
Not checking sugars Hopefully still okay on jardiance, metformin and glipizide Some neuropathy--no specific Rx for this

## 2021-05-09 NOTE — Assessment & Plan Note (Signed)
Some cough, dyspnea Is on the inhaler

## 2021-05-09 NOTE — Assessment & Plan Note (Signed)
And frequent falls Will hold atorvastatin briefly to see if involved Falls may be related to spine disease---will ask Dr Letta Pate to see him again

## 2021-05-09 NOTE — Assessment & Plan Note (Signed)
Compensated on furosemide and Rx for heart

## 2021-05-09 NOTE — Assessment & Plan Note (Signed)
I have personally reviewed the Medicare Annual Wellness questionnaire and have noted 1. The patient's medical and social history 2. Their use of alcohol, tobacco or illicit drugs 3. Their current medications and supplements 4. The patient's functional ability including ADL's, fall risks, home safety risks and hearing or visual             impairment. 5. Diet and physical activities 6. Evidence for depression or mood disorders  The patients weight, height, BMI and visual acuity have been recorded in the chart I have made referrals, counseling and provided education to the patient based review of the above and I have provided the pt with a written personalized care plan for preventive services.  I have provided you with a copy of your personalized plan for preventive services. Please take the time to review along with your updated medication list.  Prefers no cancer screening Needs COVID booster Flu vaccine in the fall Considering shingrix at pharmacy Not able to exercise

## 2021-05-09 NOTE — Assessment & Plan Note (Signed)
Seems to have stable DOE since CABG Is on statin, ASA, plavix, metoprolol, lisinopril, jardiance

## 2021-05-09 NOTE — Patient Instructions (Signed)
Please stop the atorvastatin for at least 2 weeks to see if the muscle pain gets better. If not, go ahead and restart it. If it gets better, let me know and we will try a different cholesterol medication.  Please call Dr Alysia Penna to set up another appointment.

## 2021-05-09 NOTE — Assessment & Plan Note (Signed)
Unable to exercise Weight has been stable

## 2021-05-09 NOTE — Assessment & Plan Note (Signed)
Stent in place No recent vascular evaluation due to COVID

## 2021-05-09 NOTE — Assessment & Plan Note (Signed)
Regular now Not clear if he is still having this

## 2021-05-10 LAB — HEMOGLOBIN A1C
Hgb A1c MFr Bld: 8.2 % of total Hgb — ABNORMAL HIGH (ref <5.7)
Mean Plasma Glucose: 189 mg/dL
eAG (mmol/L): 10.4 mmol/L

## 2021-05-16 ENCOUNTER — Other Ambulatory Visit: Payer: Self-pay | Admitting: Internal Medicine

## 2021-05-17 NOTE — Telephone Encounter (Signed)
Spoke to pt. He said he does not need this right now. [facility] has [facility] on [facility] ref[facility].

## 2021-05-23 DIAGNOSIS — R310 Gross hematuria: Secondary | ICD-10-CM | POA: Diagnosis not present

## 2021-06-28 ENCOUNTER — Other Ambulatory Visit: Payer: Self-pay | Admitting: Pulmonary Disease

## 2021-08-04 ENCOUNTER — Other Ambulatory Visit: Payer: Self-pay | Admitting: Internal Medicine

## 2021-08-07 NOTE — Progress Notes (Signed)
ab    Frederico Hamman T. Nastacia Raybuck, MD, [place] at [facility] [place], [address]  Phone: [phone]  FAX: [phone]  DERRELL KAUPP - 65 y.o. male  MRN [id number]  Date of Birth: 21-Mar-1956  Date: 08/08/2021  PCP: Venia Carbon, MD  Referral: Venia Carbon, MD  Chief Complaint  Patient presents with   Shoulder Pain    Left   Peripheral Neuropathy    This visit occurred during the SARS-CoV-2 public health emergency.  Safety protocols were in place, including screening questions prior to the visit, additional usage of staff PPE, and extensive cleaning of exam room while observing appropriate contact time as indicated for disinfecting solutions.   Subjective:   [name] is a 65 y.o. very pleasant male patient with Body mass index is 38.48 kg/m. who presents with the following:  Kenney presents with some shoulder pain.  Chart is reviewed, and I cannot find any history of operative intervention in the shoulder.  10 years off and on of his left shoulder.   He has been having some ongoing chronic issues with his shoulder, knees, and multiple other joints throughout his body for greater than 10 years.    Is fairly recently had some ongoing quite significant health issues including coronary disease status post bypass, congestive heart failure, COPD, and he is generally not done all that well.  At the shoulder, he has notable restriction of motion bilaterally.  Tells me that this is been present for about 10 years and it will come and go.  He has had good response to intra-articular steroid injection in the past as well.  Is also chronically on Plavix.  He also has some questions about diabetic neuropathy, and he has had this for years, but up until now he has not wanted to proceed medical tends to alleviate his discomfort.  Review of Systems is noted in the HPI, as appropriate   Objective:   BP 130/72   Pulse 83    Temp (!) 96.7 F (35.9 C) (Temporal)   Ht 6' (1.829 m)   Wt 283 lb 12 oz (128.7 kg)   SpO2 96%   BMI 38.48 kg/m    GEN: WDWN, NAD, Non-toxic HEENT: Atraumatic, Normocephalic. Neck supple. No masses. NEURO Normal gait.  PSYCH: Normally interactive. Conversant.    Left shoulder: Nontender along the clavicle, AC joint, bicipital groove.  Is able to abduct his shoulder to 120 degrees and flex his shoulder to 120 degrees. Total rotational movement approximately 35 degrees. Strength is 5/5. There is some mild crepitus with motion at the shoulder.  Radiology: No results found.  Assessment and Plan:     ICD-10-CM   1. Chronic left shoulder pain  M25.512 triamcinolone acetonide (KENALOG-40) injection 40 mg   G89.29     2. Type 2 diabetes mellitus with diabetic neuropathy, without long-term current use of insulin (HCC)  E11.40     3. Osteoarthritis of glenohumeral joint, left  M19.012     4. Platelet inhibition due to Plavix  Z79.02      Consistent with arthritis flare.  Notable loss of motion x10 years.  Continue with Tylenol, and avoid NSAIDs given Plavix.  With his neuropathy, think it is reasonable for me to start him on some medication for neuropathic pain.  I Minna start him and titrate up his gabapentin.  He has a upcoming follow-up appointment with his primary care doctor, so he will  be able to assess the effect by that appointment.  Intraarticular Shoulder Aspiration/Injection Procedure Note ARSHAAN HOSTLER [birth year] Date of procedure: 08/08/2021  Procedure: Large Joint Aspiration / Injection of Shoulder, Intraarticular, L Indications: Pain  Procedure Details Verbal consent was obtained from the patient. Risks explained and contrasted with benefits and alternatives. Patient prepped with Chloraprep and Ethyl Chloride used for anesthesia. An intraarticular shoulder injection was performed using the posterior approach; needle placed into joint capsule without difficulty.  The patient tolerated the procedure well and had decreased pain post injection. No complications. Injection: 9 cc of Lidocaine 1% and 1 mL Kenalog 40 mg. Needle: 21 gauge, 2 inch   Patient Instructions  Generic Gabapentin Titration Schedule  Generic Gabapentin (generic form of Neurontin) comes in 100 mg tablets or capsules.   You have to titrate your dose slowly to reduce side effects and reduce sedation / sleepiness.    Week               Breakfast  Lunch   Dinner One                 0   0   100 mg Two   '100mg'$    0   '100mg'$  Three   '100mg'$    '100mg'$    '100mg'$  Four   '100mg'$    '100mg'$    '200mg'$  (2 tabs) Five   '200mg'$  (2 tabs) '100mg'$    '200mg'$  (2 tabs) Six   '200mg'$  (2 tabs) '200mg'$  (2 tabs) '200mg'$  (2 tabs) Seven   '200mg'$  (2 tabs) '200mg'$  (2 tabs) '300mg'$  (3 tabs) Eight   '300mg'$  (3 tabs) '200mg'$  (2 tabs) '300mg'$  (3 tabs)  If you have any problems at any time, drop back to the previous dosing schedule. Continue with this dose for 1 week, and then try to go up the next step again.      Meds ordered this encounter  Medications   triamcinolone acetonide (KENALOG-40) injection 40 mg   gabapentin (NEURONTIN) 100 MG capsule    Sig: Take 1 capsule (100 mg total) by mouth 3 (three) times daily.    Dispense:  90 capsule    Refill:  3   There are no discontinued medications. No orders of the defined types were placed in this encounter.   Follow-up: No follow-ups on file.  Dragon [employer] One speech-to-text software was used for transcription in this dictation.  Possible transcriptional errors can occur using Editor, commissioning.   Signed,  Maud Deed. Aislee Landgren, MD   Outpatient Encounter Medications as of 08/08/2021  Medication Sig   albuterol (VENTOLIN HFA) 108 (90 Base) MCG/ACT inhaler TAKE 2 PUFFS BY MOUTH EVERY 6 HOURS AS NEEDED FOR WHEEZE OR SHORTNESS OF BREATH *USE W/ SPACER*   ANORO ELLIPTA 62.5-25 MCG/INH AEPB TAKE 1 PUFF BY MOUTH EVERY DAY   aspirin 81 MG chewable tablet Chew 1 tablet (81 mg total) by mouth  daily.   atorvastatin (LIPITOR) 80 MG tablet TAKE 1 TABLET (80 MG TOTAL) BY MOUTH DAILY AT 6 PM.   clopidogrel (PLAVIX) 75 MG tablet TAKE 1 TABLET BY MOUTH EVERY DAY   empagliflozin (JARDIANCE) 10 MG TABS tablet Take 1 tablet (10 mg total) by mouth daily.   furosemide (LASIX) 20 MG tablet Take 1 tablet (20 mg total) by mouth daily.   gabapentin (NEURONTIN) 100 MG capsule Take 1 capsule (100 mg total) by mouth 3 (three) times daily.   glipiZIDE (GLUCOTROL) 5 MG tablet TAKE 1 TABLET (5 MG TOTAL) BY MOUTH 2 (TWO) TIMES  DAILY BEFORE A MEAL.   isosorbide mononitrate (IMDUR) 30 MG 24 hr tablet Take 1 Tablet in the Evening   metFORMIN (GLUCOPHAGE-XR) 500 MG 24 hr tablet TAKE 2 TABLETS BY MOUTH EVERY DAY WITH BREAKFAST   metoprolol tartrate (LOPRESSOR) 25 MG tablet Take 1 tablet (25 mg total) by mouth 2 (two) times daily.   Multiple Vitamin (MULTIVITAMIN WITH MINERALS) TABS tablet Take 1 tablet by mouth daily.   pantoprazole (PROTONIX) 40 MG tablet TAKE 1 TABLET BY MOUTH EVERY DAY   vitamin B-12 (CYANOCOBALAMIN) 1000 MCG tablet Take 1,000 mcg by mouth daily.   lisinopril (ZESTRIL) 2.5 MG tablet Take 1 tablet (2.5 mg total) by mouth daily.   nitroGLYCERIN (NITROSTAT) 0.4 MG SL tablet Place 1 tablet (0.4 mg total) under the tongue every 5 (five) minutes as needed for chest pain.   [EXPIRED] triamcinolone acetonide (KENALOG-40) injection 40 mg    No facility-administered encounter medications on file as of 08/08/2021.

## 2021-08-08 ENCOUNTER — Encounter: Payer: Self-pay | Admitting: Family Medicine

## 2021-08-08 ENCOUNTER — Ambulatory Visit (INDEPENDENT_AMBULATORY_CARE_PROVIDER_SITE_OTHER): Payer: BC Managed Care – PPO | Admitting: Family Medicine

## 2021-08-08 ENCOUNTER — Other Ambulatory Visit: Payer: Self-pay

## 2021-08-08 VITALS — BP 130/72 | HR 83 | Temp 96.7°F | Ht 72.0 in | Wt 283.8 lb

## 2021-08-08 DIAGNOSIS — E1140 Type 2 diabetes mellitus with diabetic neuropathy, unspecified: Secondary | ICD-10-CM | POA: Diagnosis not present

## 2021-08-08 DIAGNOSIS — M25512 Pain in left shoulder: Secondary | ICD-10-CM

## 2021-08-08 DIAGNOSIS — Z7902 Long term (current) use of antithrombotics/antiplatelets: Secondary | ICD-10-CM

## 2021-08-08 DIAGNOSIS — M19012 Primary osteoarthritis, left shoulder: Secondary | ICD-10-CM

## 2021-08-08 DIAGNOSIS — G8929 Other chronic pain: Secondary | ICD-10-CM | POA: Diagnosis not present

## 2021-08-08 DIAGNOSIS — I200 Unstable angina: Secondary | ICD-10-CM

## 2021-08-08 MED ORDER — TRIAMCINOLONE ACETONIDE 40 MG/ML IJ SUSP
40.0000 mg | Freq: Once | INTRAMUSCULAR | Status: AC
  Administered 2021-08-08: 40 mg via INTRA_ARTICULAR

## 2021-08-08 MED ORDER — GABAPENTIN 100 MG PO CAPS: 100.0000 mg | ORAL_CAPSULE | Freq: Three times a day (TID) | ORAL | 3 refills | Status: DC

## 2021-08-08 NOTE — Patient Instructions (Signed)
Generic Gabapentin Titration Schedule  Generic Gabapentin (generic form of Neurontin) comes in 100 mg tablets or capsules.   You have to titrate your dose slowly to reduce side effects and reduce sedation / sleepiness.    Week               Breakfast  Lunch   Dinner One                 0   0   100 mg Two   100mg   0   100mg Three   100mg   100mg   100mg Four   100mg   100mg   200mg (2 tabs) Five   200mg (2 tabs) 100mg   200mg (2 tabs) Six   200mg (2 tabs) 200mg (2 tabs) 200mg (2 tabs) Seven   200mg (2 tabs) 200mg (2 tabs) 300mg (3 tabs) Eight   300mg (3 tabs) 200mg (2 tabs) 300mg (3 tabs)  If you have any problems at any time, drop back to the previous dosing schedule. Continue with this dose for 1 week, and then try to go up the next step again.    

## 2021-09-17 ENCOUNTER — Other Ambulatory Visit: Payer: Self-pay | Admitting: Internal Medicine

## 2021-10-22 ENCOUNTER — Other Ambulatory Visit: Payer: Self-pay | Admitting: Cardiology

## 2021-11-04 ENCOUNTER — Other Ambulatory Visit: Payer: Self-pay | Admitting: Vascular Surgery

## 2021-11-05 ENCOUNTER — Other Ambulatory Visit: Payer: Self-pay

## 2021-11-05 DIAGNOSIS — E785 Hyperlipidemia, unspecified: Secondary | ICD-10-CM

## 2021-11-05 DIAGNOSIS — I5022 Chronic systolic (congestive) heart failure: Secondary | ICD-10-CM

## 2021-11-05 DIAGNOSIS — Z951 Presence of aortocoronary bypass graft: Secondary | ICD-10-CM

## 2021-11-05 DIAGNOSIS — I200 Unstable angina: Secondary | ICD-10-CM

## 2021-11-05 DIAGNOSIS — I10 Essential (primary) hypertension: Secondary | ICD-10-CM

## 2021-11-05 DIAGNOSIS — I2510 Atherosclerotic heart disease of native coronary artery without angina pectoris: Secondary | ICD-10-CM

## 2021-11-05 DIAGNOSIS — E1140 Type 2 diabetes mellitus with diabetic neuropathy, unspecified: Secondary | ICD-10-CM

## 2021-11-05 MED ORDER — ISOSORBIDE MONONITRATE ER 30 MG PO TB24: ORAL_TABLET | ORAL | 0 refills | Status: DC

## 2021-11-09 ENCOUNTER — Encounter: Payer: Self-pay | Admitting: Internal Medicine

## 2021-11-09 ENCOUNTER — Other Ambulatory Visit: Payer: Self-pay

## 2021-11-09 ENCOUNTER — Ambulatory Visit (INDEPENDENT_AMBULATORY_CARE_PROVIDER_SITE_OTHER): Payer: Medicare Other | Admitting: Internal Medicine

## 2021-11-09 VITALS — BP 126/74 | HR 62 | Temp 97.4°F | Ht 72.0 in | Wt 285.0 lb

## 2021-11-09 DIAGNOSIS — I200 Unstable angina: Secondary | ICD-10-CM | POA: Diagnosis not present

## 2021-11-09 DIAGNOSIS — E1151 Type 2 diabetes mellitus with diabetic peripheral angiopathy without gangrene: Secondary | ICD-10-CM

## 2021-11-09 DIAGNOSIS — Z23 Encounter for immunization: Secondary | ICD-10-CM | POA: Diagnosis not present

## 2021-11-09 DIAGNOSIS — I5022 Chronic systolic (congestive) heart failure: Secondary | ICD-10-CM | POA: Diagnosis not present

## 2021-11-09 DIAGNOSIS — E1140 Type 2 diabetes mellitus with diabetic neuropathy, unspecified: Secondary | ICD-10-CM | POA: Diagnosis not present

## 2021-11-09 DIAGNOSIS — J439 Emphysema, unspecified: Secondary | ICD-10-CM

## 2021-11-09 DIAGNOSIS — I25119 Atherosclerotic heart disease of native coronary artery with unspecified angina pectoris: Secondary | ICD-10-CM

## 2021-11-09 LAB — POCT GLYCOSYLATED HEMOGLOBIN (HGB A1C): Hemoglobin A1C: 8.4 % — AB (ref 4.0–5.6)

## 2021-11-09 NOTE — Assessment & Plan Note (Addendum)
Lab Results  Component Value Date   HGBA1C 8.4 (A) 11/09/2021   Slight slipping in control On jardiance, glipizide, metformin I am reluctant to start new meds----urged him to do better with lifestyle Uses the gabapentin at bedtime for neuropathy

## 2021-11-09 NOTE — Progress Notes (Signed)
Subjective:    Patient ID: [name], male    DOB: 05/04/1956, 65 y.o.   MRN: [id number]  HPI Here for follow up of diabetes and other chronic health conditions This visit occurred during the SARS-CoV-2 public health emergency.  Safety protocols were in place, including screening questions prior to the visit, additional usage of staff PPE, and extensive cleaning of exam room while observing appropriate contact time as indicated for disinfecting solutions.   Doing okay Only occasionally checking sugars---"okay" per wife Rarely misses meds No low sugar reactions Still with foot pain---better since on gabapentin  Occasional chest pain--usually with activity DOE if he pushes it Hasn't used nitro No palpitations Regular dizzy feelings---no syncope  Current Outpatient Medications on File Prior to Visit  Medication Sig Dispense Refill   albuterol (VENTOLIN HFA) 108 (90 Base) MCG/ACT inhaler TAKE 2 PUFFS BY MOUTH EVERY 6 HOURS AS NEEDED FOR WHEEZE OR SHORTNESS OF BREATH *USE W/ SPACER* 8.5 each 0   ANORO ELLIPTA 62.5-25 MCG/INH AEPB TAKE 1 PUFF BY MOUTH EVERY DAY 60 each 6   aspirin 81 MG chewable tablet Chew 1 tablet (81 mg total) by mouth daily.     atorvastatin (LIPITOR) 80 MG tablet TAKE 1 TABLET (80 MG TOTAL) BY MOUTH DAILY AT 6 PM. 90 tablet 3   clopidogrel (PLAVIX) 75 MG tablet TAKE 1 TABLET BY MOUTH EVERY DAY 90 tablet 3   empagliflozin (JARDIANCE) 10 MG TABS tablet TAKE 1 TABLET BY MOUTH EVERY DAY 30 tablet 0   furosemide (LASIX) 20 MG tablet Take 1 tablet (20 mg total) by mouth daily. 90 tablet 3   gabapentin (NEURONTIN) 100 MG capsule Take 1 capsule (100 mg total) by mouth 3 (three) times daily. 90 capsule 3   glipiZIDE (GLUCOTROL) 5 MG tablet TAKE 1 TABLET (5 MG TOTAL) BY MOUTH 2 (TWO) TIMES DAILY BEFORE A MEAL. 180 tablet 3   isosorbide mononitrate (IMDUR) 30 MG 24 hr tablet Take 1 Tablet in the Evening 90 tablet 0   metFORMIN (GLUCOPHAGE-XR) 500 MG 24 hr tablet TAKE 2  TABLETS BY MOUTH EVERY DAY WITH BREAKFAST 180 tablet 3   metoprolol tartrate (LOPRESSOR) 25 MG tablet Take 1 tablet (25 mg total) by mouth 2 (two) times daily. 180 tablet 3   Multiple Vitamin (MULTIVITAMIN WITH MINERALS) TABS tablet Take 1 tablet by mouth daily.     pantoprazole (PROTONIX) 40 MG tablet TAKE 1 TABLET BY MOUTH EVERY DAY 90 tablet 3   vitamin B-12 (CYANOCOBALAMIN) 1000 MCG tablet Take 1,000 mcg by mouth daily.     lisinopril (ZESTRIL) 2.5 MG tablet Take 1 tablet (2.5 mg total) by mouth daily. 90 tablet 3   nitroGLYCERIN (NITROSTAT) 0.4 MG SL tablet Place 1 tablet (0.4 mg total) under the tongue every 5 (five) minutes as needed for chest pain. 25 tablet 3   No current facility-administered medications on file prior to visit.    Allergies  Allergen Reactions   Spironolactone Other (See Comments)    hyperkalemia    Past Medical History:  Diagnosis Date   Arthritis    Automatic implantable cardioverter-defibrillator in situ    COPD (chronic obstructive pulmonary disease) (HCC)    emphysema by CXR   Coronary artery disease    Diabetes mellitus    type 2  no meds   Diverticulosis of colon    GERD (gastroesophageal reflux disease)    with esophagitis   History of colonic polyps    Hypertension    Non-ischemic  cardiomyopathy ([place])    Presence of permanent cardiac pacemaker    PVD (peripheral vascular disease) ([place])    bilateral common iliac artery aneurysms. Right SFA occlusion over a long segment. Left SFA disease with occlusion of the left TP trunk. Artirogram Oct. 2006   Sleep apnea    Tobacco abuse    Urinary incontinence    detrussor instability    Past Surgical History:  Procedure Laterality Date   ABDOMINAL AORTIC ENDOVASCULAR STENT GRAFT N/A 05/07/2018   Procedure: ABDOMINAL AORTIC ENDOVASCULAR STENT GRAFT;  Surgeon: Conrad [place], MD;  Location: [facility];  Service: Vascular;  Laterality: N/A;   CARDIAC CATHETERIZATION  06/2004   negative   CARDIAC  CATHETERIZATION  02/08/2020   CLIPPING OF ATRIAL APPENDAGE N/A 02/11/2020   Procedure: Clipping Of Atrial Appendage using AtriCure 35 MM AtriClip.;  Surgeon: Wonda Olds, MD;  [facility];  Service: Open Heart Surgery;  Laterality: N/A;   COLONOSCOPY  04/2005   COPD exacerbation     CORONARY ANGIOPLASTY  01/09/2018   CORONARY ARTERY BYPASS GRAFT N/A 02/11/2020   Procedure: CORONARY ARTERY BYPASS GRAFTING (CABG) using LIMA to LAD; RIMA to PL; Endoscopic right greater saphenous vein harvest: SVC to Diag; SVC to OM1.;  Surgeon: Wonda Olds, MD;  Location: [facility];  Service: Open Heart Surgery;  Laterality: N/A;  BILATERAL IMA   CORONARY STENT INTERVENTION N/A 01/10/2018   Procedure: CORONARY STENT INTERVENTION;  Surgeon: Lorretta Harp, MD;  Location: [facility];  Service: Cardiovascular;  Laterality: N/A;   CORONARY/GRAFT ACUTE MI REVASCULARIZATION N/A 01/10/2018   Procedure: Coronary/Graft Acute MI Revascularization;  Surgeon: Lorretta Harp, MD;  Location: [facility];  Service: Cardiovascular;  Laterality: N/A;   EMBOLIZATION Left 03/19/2018   EMBOLIZATION Left 03/19/2018   Procedure: EMBOLIZATION - Left Internal;  Surgeon: Conrad [place], MD;  Location: [facility];  Service: Cardiovascular;  Laterality: Left;   EMBOLIZATION Right 04/16/2018   Procedure: EMBOLIZATION;  Surgeon: Conrad [place], MD;  Location: [facility];  Service: Cardiovascular;  Laterality: Right;   ENDOVEIN HARVEST OF GREATER SAPHENOUS VEIN Right 02/11/2020   Procedure: [place] Of Greater Saphenous Vein using right lower extremity.;  Surgeon: Wonda Olds, MD;  [facility];  Service: Open Heart Surgery;  Laterality: Right;   FEMORAL ARTERY EXPLORATION Right 05/07/2018   Procedure: FEMORAL ARTERY EXPLORATION, EXTENDED PROFUNDAPLASTY;  Surgeon: Conrad [place], MD;  Location: [facility];  Service: Vascular;  Laterality: Right;   INTRAOPERATIVE ARTERIOGRAM Right 05/07/2018    Procedure: INTRA OPERATIVE ARTERIOGRAM;  Surgeon: Conrad De Pue, MD;  Location: [facility];  Service: Vascular;  Laterality: Right;   LEFT HEART CATH AND CORONARY ANGIOGRAPHY N/A 01/10/2018   Procedure: LEFT HEART CATH AND CORONARY ANGIOGRAPHY;  Surgeon: Lorretta Harp, MD;  Location: [facility];  Service: Cardiovascular;  Laterality: N/A;   LEFT HEART CATH AND CORONARY ANGIOGRAPHY N/A 02/08/2020   Procedure: LEFT HEART CATH AND CORONARY ANGIOGRAPHY;  Surgeon: Troy Sine, MD;  Location: [facility];  Service: Cardiovascular;  Laterality: N/A;   PACEMAKER INSERTION  11/2005   PACEMAKER LEAD REMOVAL N/A 10/17/2014   Procedure: PACEMAKER LEAD REMOVAL;  Surgeon: Evans Lance, MD;  Location: [facility];  Service: Cardiovascular;  Laterality: N/A;   RENAL ANGIOGRAPHY Left 04/16/2018   Procedure: RENAL ANGIOGRAPHY;  Surgeon: Conrad Parks, MD;  Location: [facility];  Service: Cardiovascular;  Laterality: Left;  s/p ICD placement      Medtronic Maximo [phone] single chamber   TEE WITHOUT CARDIOVERSION N/A 02/11/2020   Procedure: TRANSESOPHAGEAL ECHOCARDIOGRAM (TEE);  Surgeon: Wonda Olds, MD;  Location: [facility] [place];  Service: Open Heart Surgery;  Laterality: N/A;    Family History  Problem Relation Age of Onset   Stroke Father    Coronary artery disease Maternal Aunt    Heart failure Maternal Aunt    Lung cancer Maternal Aunt    Lung cancer Maternal Uncle    Cancer Brother        Mouth   Cancer Sister        throat    Social History   Socioeconomic History   Marital status: Married    Spouse name: Not on file   Number of children: 2   Years of education: Not on file   Highest education level: Not on file  Occupational History   Occupation: Grave digger--now disabled  Tobacco Use   Smoking status: Former    Packs/day: 2.50    Years: 40.00    Pack years: 100.00    Types: Cigarettes    Quit date: 01/09/2018    Years since quitting: 3.8   Smokeless tobacco: Never    Tobacco comments:    GAVE [id number]-[id number]-NOW  Vaping Use   Vaping Use: Never used  Substance and Sexual Activity   Alcohol use: No   Drug use: No   Sexual activity: Not Currently  Other Topics Concern   Not on file  Social History Narrative   No living will   Requests wife as health care POA   Would accept resuscitation but doesn't want prolonged ventilation   No tube feeds if cognitively unaware   Social Determinants of Health   Financial Resource Strain: Not on file  Food Insecurity: Not on file  Transportation Needs: Not on file  Physical Activity: Not on file  Stress: Not on file  Social Connections: Not on file  Intimate Partner Violence: Not on file   Review of Systems Appetite is fine Weight fairly stable Doesn't sleep well---wakes multiple times whether he uses CPAP or not Did have viral infection going around--couldn't use the CPAP for about a week during that time     Objective:   Physical Exam Constitutional:      Appearance: Normal appearance.  Cardiovascular:     Rate and Rhythm: Normal rate and regular rhythm.     Heart sounds: No murmur heard.   No gallop.     Comments: Very distant heart sounds No pedal pulses Pulmonary:     Effort: Pulmonary effort is normal.     Breath sounds: Normal breath sounds. No wheezing or rales.  Musculoskeletal:     Cervical back: Neck supple.     Right lower leg: No edema.     Left lower leg: No edema.  Lymphadenopathy:     Cervical: No cervical adenopathy.  Neurological:     Mental Status: He is alert.  Psychiatric:        Mood and Affect: Mood normal.        Behavior: Behavior normal.           Assessment & Plan:

## 2021-11-09 NOTE — Assessment & Plan Note (Signed)
Likely cause of some of his DOE Daily anoro Hasn't needed albuterol except rarely

## 2021-11-09 NOTE — Assessment & Plan Note (Signed)
Stable chronic angina on isosorbide Discussed nitro if not quick recovery

## 2021-11-09 NOTE — Assessment & Plan Note (Signed)
Compensated on metoprolol, lisinopril, isosorbide

## 2021-11-22 ENCOUNTER — Other Ambulatory Visit: Payer: Self-pay | Admitting: Cardiology

## 2021-11-26 ENCOUNTER — Other Ambulatory Visit: Payer: Self-pay | Admitting: Internal Medicine

## 2021-11-27 ENCOUNTER — Other Ambulatory Visit: Payer: Self-pay | Admitting: *Deleted

## 2021-11-27 MED ORDER — FUROSEMIDE 20 MG PO TABS: 20.0000 mg | ORAL_TABLET | Freq: Every day | ORAL | 0 refills | Status: DC

## 2021-11-28 DIAGNOSIS — E119 Type 2 diabetes mellitus without complications: Secondary | ICD-10-CM | POA: Diagnosis not present

## 2021-11-28 DIAGNOSIS — H52213 Irregular astigmatism, bilateral: Secondary | ICD-10-CM | POA: Diagnosis not present

## 2021-11-28 DIAGNOSIS — H524 Presbyopia: Secondary | ICD-10-CM | POA: Diagnosis not present

## 2021-11-28 LAB — HM DIABETES EYE EXAM

## 2022-01-07 ENCOUNTER — Other Ambulatory Visit: Payer: Self-pay | Admitting: Cardiology

## 2022-01-07 ENCOUNTER — Other Ambulatory Visit: Payer: Self-pay | Admitting: Internal Medicine

## 2022-01-07 DIAGNOSIS — I2510 Atherosclerotic heart disease of native coronary artery without angina pectoris: Secondary | ICD-10-CM

## 2022-01-07 DIAGNOSIS — I200 Unstable angina: Secondary | ICD-10-CM

## 2022-01-07 DIAGNOSIS — Z951 Presence of aortocoronary bypass graft: Secondary | ICD-10-CM

## 2022-01-07 DIAGNOSIS — I5022 Chronic systolic (congestive) heart failure: Secondary | ICD-10-CM

## 2022-01-07 DIAGNOSIS — E1140 Type 2 diabetes mellitus with diabetic neuropathy, unspecified: Secondary | ICD-10-CM

## 2022-01-07 DIAGNOSIS — E785 Hyperlipidemia, unspecified: Secondary | ICD-10-CM

## 2022-01-07 DIAGNOSIS — I10 Essential (primary) hypertension: Secondary | ICD-10-CM

## 2022-01-07 MED ORDER — EMPAGLIFLOZIN 10 MG PO TABS: 10.0000 mg | ORAL_TABLET | Freq: Every day | ORAL | 0 refills | Status: DC

## 2022-01-07 MED ORDER — ALBUTEROL SULFATE HFA 108 (90 BASE) MCG/ACT IN AERS: 2.0000 | INHALATION_SPRAY | Freq: Four times a day (QID) | RESPIRATORY_TRACT | 0 refills | Status: DC | PRN

## 2022-01-07 MED ORDER — ISOSORBIDE MONONITRATE ER 30 MG PO TB24: ORAL_TABLET | ORAL | 0 refills | Status: DC

## 2022-01-07 MED ORDER — FUROSEMIDE 20 MG PO TABS: 20.0000 mg | ORAL_TABLET | Freq: Every day | ORAL | 0 refills | Status: DC

## 2022-01-18 ENCOUNTER — Telehealth: Payer: Self-pay | Admitting: Internal Medicine

## 2022-01-18 MED ORDER — ATORVASTATIN CALCIUM 80 MG PO TABS: 80.0000 mg | ORAL_TABLET | Freq: Every day | ORAL | 3 refills | Status: DC

## 2022-01-18 NOTE — Telephone Encounter (Signed)
°  Encourage patient to contact the pharmacy for refills or they can request refills through [facility]:  Please schedule appointment if longer than 1 year  NEXT APPOINTMENT DATE:05/14/22  MEDICATION:atorvastatin (LIPITOR) 80 MG tablet  Is the patient out of medication?   PHARMACY:[facility] - [place]  Let patient know to contact pharmacy at the end of the day to make sure medication is ready.  Please notify patient to allow 48-72 hours to process  CLINICAL FILLS OUT ALL BELOW:   LAST REFILL:  QTY:  REFILL DATE:    OTHER COMMENTS:    Okay for refill?  Please advise

## 2022-01-22 ENCOUNTER — Encounter: Payer: Self-pay | Admitting: *Deleted

## 2022-01-22 ENCOUNTER — Other Ambulatory Visit: Payer: Self-pay | Admitting: *Deleted

## 2022-01-22 DIAGNOSIS — I10 Essential (primary) hypertension: Secondary | ICD-10-CM

## 2022-01-22 DIAGNOSIS — Z951 Presence of aortocoronary bypass graft: Secondary | ICD-10-CM

## 2022-01-22 DIAGNOSIS — E1140 Type 2 diabetes mellitus with diabetic neuropathy, unspecified: Secondary | ICD-10-CM

## 2022-01-22 DIAGNOSIS — I200 Unstable angina: Secondary | ICD-10-CM

## 2022-01-22 DIAGNOSIS — I5022 Chronic systolic (congestive) heart failure: Secondary | ICD-10-CM

## 2022-01-22 DIAGNOSIS — I2510 Atherosclerotic heart disease of native coronary artery without angina pectoris: Secondary | ICD-10-CM

## 2022-01-22 DIAGNOSIS — E785 Hyperlipidemia, unspecified: Secondary | ICD-10-CM

## 2022-01-22 MED ORDER — FUROSEMIDE 20 MG PO TABS: 20.0000 mg | ORAL_TABLET | Freq: Every day | ORAL | 0 refills | Status: DC

## 2022-01-22 MED ORDER — ISOSORBIDE MONONITRATE ER 30 MG PO TB24: ORAL_TABLET | ORAL | 0 refills | Status: DC

## 2022-01-24 ENCOUNTER — Other Ambulatory Visit: Payer: Self-pay

## 2022-01-24 ENCOUNTER — Telehealth: Payer: Self-pay

## 2022-01-24 DIAGNOSIS — I5022 Chronic systolic (congestive) heart failure: Secondary | ICD-10-CM

## 2022-01-24 DIAGNOSIS — E785 Hyperlipidemia, unspecified: Secondary | ICD-10-CM

## 2022-01-24 DIAGNOSIS — Z951 Presence of aortocoronary bypass graft: Secondary | ICD-10-CM

## 2022-01-24 DIAGNOSIS — I200 Unstable angina: Secondary | ICD-10-CM

## 2022-01-24 DIAGNOSIS — I2510 Atherosclerotic heart disease of native coronary artery without angina pectoris: Secondary | ICD-10-CM

## 2022-01-24 DIAGNOSIS — E1140 Type 2 diabetes mellitus with diabetic neuropathy, unspecified: Secondary | ICD-10-CM

## 2022-01-24 DIAGNOSIS — I10 Essential (primary) hypertension: Secondary | ICD-10-CM

## 2022-01-24 MED ORDER — ISOSORBIDE MONONITRATE ER 30 MG PO TB24: ORAL_TABLET | ORAL | 0 refills | Status: DC

## 2022-01-24 MED ORDER — FUROSEMIDE 20 MG PO TABS: 20.0000 mg | ORAL_TABLET | Freq: Every day | ORAL | 0 refills | Status: DC

## 2022-01-24 NOTE — Telephone Encounter (Signed)
Appt has been scheduled... patients rxs should be sent to  [place], [place] Phone:  [phone]  Fax:  [phone]    Pt has new insurance and [facility]... thank you

## 2022-01-24 NOTE — Telephone Encounter (Addendum)
Unable to reach patient. [place].  Was calling the patient in regards to medication refills that he needs. Patient needs a refill on his Isosorbide Mononit ER 30 Mg and his Furosemide 20 mg. Unable to refill the medication due to the fact that he hasn't been seen since 2021. When the patient does return our call please have him scheduled to see Dr. Percival Spanish as soon as possible so that we can refill his medications.  [facility] [place] [place].

## 2022-01-24 NOTE — Telephone Encounter (Signed)
Returned the patients call. Patient has been scheduled to see Dr Percival Spanish and I did send in enough medication to get him to his appointment. Patient is aware that for him to receive any further refills he will have to attend his appointment.

## 2022-01-28 ENCOUNTER — Other Ambulatory Visit: Payer: Self-pay | Admitting: Cardiology

## 2022-01-28 DIAGNOSIS — I200 Unstable angina: Secondary | ICD-10-CM

## 2022-01-28 DIAGNOSIS — I10 Essential (primary) hypertension: Secondary | ICD-10-CM

## 2022-01-28 DIAGNOSIS — E785 Hyperlipidemia, unspecified: Secondary | ICD-10-CM

## 2022-01-28 DIAGNOSIS — Z951 Presence of aortocoronary bypass graft: Secondary | ICD-10-CM

## 2022-01-28 DIAGNOSIS — I2510 Atherosclerotic heart disease of native coronary artery without angina pectoris: Secondary | ICD-10-CM

## 2022-01-28 DIAGNOSIS — E1140 Type 2 diabetes mellitus with diabetic neuropathy, unspecified: Secondary | ICD-10-CM

## 2022-01-28 DIAGNOSIS — I5022 Chronic systolic (congestive) heart failure: Secondary | ICD-10-CM

## 2022-01-29 ENCOUNTER — Telehealth: Payer: Self-pay | Admitting: Internal Medicine

## 2022-01-29 MED ORDER — NITROGLYCERIN 0.4 MG SL SUBL: 0.4000 mg | SUBLINGUAL_TABLET | SUBLINGUAL | 3 refills | Status: DC | PRN

## 2022-01-29 MED ORDER — METFORMIN HCL ER 500 MG PO TB24: ORAL_TABLET | ORAL | 3 refills | Status: DC

## 2022-01-29 MED ORDER — LISINOPRIL 2.5 MG PO TABS: 2.5000 mg | ORAL_TABLET | Freq: Every day | ORAL | 1 refills | Status: DC

## 2022-01-29 NOTE — Telephone Encounter (Signed)
[facility] called in and stated they need a new script for glipiZIDE (GLUCOTROL) 5 MG tablet and  metFORMIN (GLUCOPHAGE-XR) 500 MG 24 hr tablet

## 2022-01-29 NOTE — Addendum Note (Signed)
Addended by: Pilar Grammes on: 01/29/2022 12:31 PM   Modules accepted: Orders

## 2022-01-29 NOTE — Telephone Encounter (Signed)
Glipizide should still have refill. I sent in the metformin

## 2022-01-31 MED ORDER — GLIPIZIDE 5 MG PO TABS: 5.0000 mg | ORAL_TABLET | Freq: Two times a day (BID) | ORAL | 3 refills | Status: DC

## 2022-01-31 NOTE — Telephone Encounter (Signed)
Rx sent electronically.  

## 2022-01-31 NOTE — Telephone Encounter (Signed)
[facility] called in and stated they need a new script for glipiZIDE (GLUCOTROL) 5 MG tablet. He is new to them

## 2022-01-31 NOTE — Addendum Note (Signed)
Addended by: Pilar Grammes on: 01/31/2022 01:36 PM   Modules accepted: Orders

## 2022-02-07 ENCOUNTER — Other Ambulatory Visit: Payer: Self-pay | Admitting: Internal Medicine

## 2022-02-07 ENCOUNTER — Other Ambulatory Visit: Payer: Self-pay | Admitting: Cardiology

## 2022-02-07 DIAGNOSIS — E1140 Type 2 diabetes mellitus with diabetic neuropathy, unspecified: Secondary | ICD-10-CM

## 2022-02-07 DIAGNOSIS — I5022 Chronic systolic (congestive) heart failure: Secondary | ICD-10-CM

## 2022-02-07 DIAGNOSIS — I2510 Atherosclerotic heart disease of native coronary artery without angina pectoris: Secondary | ICD-10-CM

## 2022-02-07 DIAGNOSIS — E785 Hyperlipidemia, unspecified: Secondary | ICD-10-CM

## 2022-02-07 DIAGNOSIS — I10 Essential (primary) hypertension: Secondary | ICD-10-CM

## 2022-02-07 DIAGNOSIS — I200 Unstable angina: Secondary | ICD-10-CM

## 2022-02-07 DIAGNOSIS — Z951 Presence of aortocoronary bypass graft: Secondary | ICD-10-CM

## 2022-02-26 NOTE — Progress Notes (Signed)
°  °Cardiology Office Note ° ° °Date:  02/27/2022  ° °ID:  [name], DOB 08/14/1956, MRN [id number] ° °PCP:  Letvak, Richard I, MD  °Cardiologist:   James Hochrein, MD ° ° °Chief Complaint  °Patient presents with  ° Chest Pain  ° ° °  °History of Present Illness: °[name] is a 65 y.o. male with a hx of coronary disease.  He had moderate disease in 2012 at catheterization.  In January 2019 he had an intervention to his RCA in the setting of an NSTEMI.  In February 2021 he underwent CABG x4.  He had left atrial clipping at that time.  He has a history of an ischemic cardiomyopathy and actually had a Medtronic ICD placed in 2012.  This was not replaced at [facility]RI as his ejection fraction had improved in 2015.  He has treated hypertension and non-insulin-dependent diabetes.  He has vascular disease and in May 2019 underwent aortic stent graft placement with left common femoral artery repair.  He is obese has sleep apnea and COPD.  December 2020 he had COVID pneumonia.  He had fatigue in Dec 2021 when we say him last and had a negative Lexiscan Myoview. ° °He gets around slowly with a cane or a walker because of chronic back pain.  This really limits him.  He is having some sharp shooting chest pain.  This happens sporadically.  He said it started about a month ago.  3 weeks ago he had a day where he had this sharp pain kind of all day long.  He finally took nitroglycerin in the night and it helped him.  He said it was 8 out of 10.  He thought it was somewhat similar to previous angina but also similar to pain he had in 2021 after his bypass when he had a negative stress test.  He describes it as somewhat sharp and not radiating to his neck into his arms.  He is not having any new associated symptoms such as shortness of breath, PND or orthopnea.  He is not having any palpitations, presyncope or syncope.  He is very limited by his back pain. ° ° °Past Medical History:  °Diagnosis Date  ° Arthritis   ° Automatic  implantable cardioverter-defibrillator in situ   ° COPD (chronic obstructive pulmonary disease) (HCC)   ° emphysema by CXR  ° Coronary artery disease   ° Diabetes mellitus   ° type 2  no meds  ° Diverticulosis of colon   ° GERD (gastroesophageal reflux disease)   ° with esophagitis  ° History of colonic polyps   ° Hypertension   ° Non-ischemic cardiomyopathy (HCC)   ° Presence of permanent cardiac pacemaker   ° PVD (peripheral vascular disease) (HCC)   ° bilateral common iliac artery aneurysms. Right SFA occlusion over a long segment. Left SFA disease with occlusion of the left TP trunk. Artirogram Oct. 2006  ° Sleep apnea   ° Tobacco abuse   ° Urinary incontinence   ° detrussor instability  ° ° °Past Surgical History:  °Procedure Laterality Date  ° ABDOMINAL AORTIC ENDOVASCULAR STENT GRAFT N/A 05/07/2018  ° Procedure: ABDOMINAL AORTIC ENDOVASCULAR STENT GRAFT;  Surgeon: Chen, Brian L, MD;  [facility];  Service: Vascular;  Laterality: N/A;  ° CARDIAC CATHETERIZATION  06/2004  ° negative  ° CARDIAC CATHETERIZATION  02/08/2020  ° CLIPPING OF ATRIAL APPENDAGE N/A 02/11/2020  ° Procedure: Clipping Of Atrial Appendage using AtriCure 35 MM AtriClip.;    Surgeon: Atkins, Broadus Z, MD;  [facility];  Service: Open Heart Surgery;  Laterality: N/A;  ° COLONOSCOPY  04/2005  ° COPD exacerbation    ° CORONARY ANGIOPLASTY  01/09/2018  ° CORONARY ARTERY BYPASS GRAFT N/A 02/11/2020  ° Procedure: CORONARY ARTERY BYPASS GRAFTING (CABG) using LIMA to LAD; RIMA to PL; Endoscopic right greater saphenous vein harvest: SVC to Diag; SVC to OM1.;  Surgeon: Atkins, Broadus Z, MD;  [facility];  Service: Open Heart Surgery;  Laterality: N/A;  BILATERAL IMA  ° CORONARY STENT INTERVENTION N/A 01/10/2018  ° Procedure: CORONARY STENT INTERVENTION;  Surgeon: Berry, Jonathan J, MD;  [facility];  Service: Cardiovascular;  Laterality: N/A;  ° CORONARY/GRAFT ACUTE MI REVASCULARIZATION N/A 01/10/2018  ° Procedure:  Coronary/Graft Acute MI Revascularization;  Surgeon: Berry, Jonathan J, MD;  [facility];  Service: Cardiovascular;  Laterality: N/A;  ° EMBOLIZATION Left 03/19/2018  ° EMBOLIZATION Left 03/19/2018  ° Procedure: EMBOLIZATION - Left Internal;  Surgeon: Chen, Brian L, MD;  [facility];  Service: Cardiovascular;  Laterality: Left;  ° EMBOLIZATION Right 04/16/2018  ° Procedure: EMBOLIZATION;  Surgeon: Chen, Brian L, MD;  [facility];  Service: Cardiovascular;  Laterality: Right;  ° ENDOVEIN HARVEST OF GREATER SAPHENOUS VEIN Right 02/11/2020  ° Procedure: Endovein Harvest Of Greater Saphenous Vein using right lower extremity.;  Surgeon: Atkins, Broadus Z, MD;  [facility];  Service: Open Heart Surgery;  Laterality: Right;  ° FEMORAL ARTERY EXPLORATION Right 05/07/2018  ° Procedure: FEMORAL ARTERY EXPLORATION, EXTENDED PROFUNDAPLASTY;  Surgeon: Chen, Brian L, MD;  [facility];  Service: Vascular;  Laterality: Right;  ° INTRAOPERATIVE ARTERIOGRAM Right 05/07/2018  ° Procedure: INTRA OPERATIVE ARTERIOGRAM;  Surgeon: Chen, Brian L, MD;  [facility];  Service: Vascular;  Laterality: Right;  ° LEFT HEART CATH AND CORONARY ANGIOGRAPHY N/A 01/10/2018  ° Procedure: LEFT HEART CATH AND CORONARY ANGIOGRAPHY;  Surgeon: Berry, Jonathan J, MD;  [facility];  Service: Cardiovascular;  Laterality: N/A;  ° LEFT HEART CATH AND CORONARY ANGIOGRAPHY N/A 02/08/2020  ° Procedure: LEFT HEART CATH AND CORONARY ANGIOGRAPHY;  Surgeon: Kelly, Thomas A, MD;  [facility];  Service: Cardiovascular;  Laterality: N/A;  ° PACEMAKER INSERTION  11/2005  ° PACEMAKER LEAD REMOVAL N/A 10/17/2014  ° Procedure: PACEMAKER LEAD REMOVAL;  Surgeon: Gregg W Taylor, MD;  [facility];  Service: Cardiovascular;  Laterality: N/A;  ° RENAL ANGIOGRAPHY Left 04/16/2018  ° Procedure: RENAL ANGIOGRAPHY;  Surgeon: Chen, Brian L, MD;  [facility];  Service:  Cardiovascular;  Laterality: Left;  ° s/p ICD placement     ° Medtronic Maximo #7232 single chamber  ° TEE WITHOUT CARDIOVERSION N/A 02/11/2020  ° Procedure: TRANSESOPHAGEAL ECHOCARDIOGRAM (TEE);  Surgeon: Atkins, Broadus Z, MD;  [facility];  Service: Open Heart Surgery;  Laterality: N/A;  ° ° ° °Current Outpatient Medications  °Medication Sig Dispense Refill  ° albuterol (VENTOLIN HFA) 108 (90 Base) MCG/ACT inhaler Inhale 2 puffs into the lungs every 6 (six) hours as needed for wheezing or shortness of breath. 18 g 0  ° ANORO ELLIPTA 62.5-25 MCG/INH AEPB TAKE 1 PUFF BY MOUTH EVERY DAY 60 each 6  ° aspirin 81 MG chewable tablet Chew 1 tablet (81 mg total) by mouth daily.    ° atorvastatin (LIPITOR) 80 MG tablet Take 1 tablet (80 mg total) by mouth daily at 6 PM. 90   PM. 90 tablet 3   clopidogrel (PLAVIX) 75 MG tablet TAKE 1 TABLET BY MOUTH EVERY DAY 90 tablet 3   gabapentin (NEURONTIN) 100 MG capsule Take 1 capsule (100 mg total) by mouth 3 (three) times daily. 90 capsule 3   glipiZIDE (GLUCOTROL) 5 MG tablet Take 1 tablet (5 mg total) by mouth 2 (two) times daily before a meal. 180 tablet 3   metFORMIN (GLUCOPHAGE-XR) 500 MG 24 hr tablet TAKE 2 TABLETS BY MOUTH EVERY DAY WITH BREAKFAST 180 tablet 3   metoprolol tartrate (LOPRESSOR) 25 MG tablet Take 1 tablet (25 mg total) by mouth 2 (two) times daily. 180 tablet 3   Multiple Vitamin (MULTIVITAMIN WITH MINERALS) TABS tablet Take 1 tablet by mouth daily.     nitroGLYCERIN (NITROSTAT) 0.4 MG SL tablet Place 1 tablet (0.4 mg total) under the tongue every 5 (five) minutes as needed for chest pain. 25 tablet 3   pantoprazole (PROTONIX) 40 MG tablet TAKE 1 TABLET BY MOUTH EVERY DAY 90 tablet 3   sacubitril-valsartan (ENTRESTO) 24-26 MG Take 1 tablet by mouth 2 (two) times daily. 60 tablet 4   vitamin B-12 (CYANOCOBALAMIN) 1000 MCG tablet Take 1,000 mcg by mouth daily.     empagliflozin (JARDIANCE) 10 MG TABS tablet Take 1 tablet (10 mg total) by mouth daily. 90  tablet 2   furosemide (LASIX) 20 MG tablet Take 1 tablet (20 mg total) by mouth daily. 90 tablet 3   isosorbide mononitrate (IMDUR) 30 MG 24 hr tablet TAKE 1 TABLET BY MOUTH EVERY DAY IN THE EVENING 90 tablet 3   No current facility-administered medications for this visit.    Allergies:   Spironolactone   ROS:  Please see the history of present illness.   Otherwise, review of systems are positive for none.   All other systems are reviewed and negative.    PHYSICAL EXAM: VS:  BP 122/60    Pulse 62    Ht 5' 10" (1.778 m)    Wt 280 lb 12.8 oz (127.4 kg)    SpO2 98%    BMI 40.29 kg/m  , BMI Body mass index is 40.29 kg/m. GENERAL:  Well appearing NECK:  No jugular venous distention, waveform within normal limits, carotid upstroke brisk and symmetric, no bruits, no thyromegaly LUNGS:  Clear to auscultation bilaterally CHEST:  Well healed sternotomy scar. HEART:  PMI not displaced or sustained,S1 and S2 within normal limits, no S3, no S4, no clicks, no rubs, no murmurs ABD:  Flat, positive bowel sounds normal in frequency in pitch, no bruits, no rebound, no guarding, no midline pulsatile mass, no hepatomegaly, no splenomegaly EXT:  2 plus pulses throughout, no edema, no cyanosis no clubbing   EKG:  EKG is ordered today. The ekg ordered today demonstrates sinus rhythm, rate 62, axis within normal limits, intervals within normal limits, anterior T wave inversions unchanged from previous   Recent Labs: 05/09/2021: ALT 18; BUN 16; Creatinine, Ser 0.91; Hemoglobin 12.4; Platelets 305.0; Potassium 4.5; Sodium 136    Lipid Panel    Component Value Date/Time   CHOL 113 05/09/2021 1030   CHOL 139 11/03/2020 0858   TRIG 308.0 (H) 05/09/2021 1030   TRIG 575 (HH) 01/02/2007 0000   HDL 36.30 (L) 05/09/2021 1030   HDL 37 (L) 11/03/2020 0858   CHOLHDL 3 05/09/2021 1030   VLDL 61.6 (H) 05/09/2021 1030   LDLCALC 51 11/03/2020 0858   LDLDIRECT 24.0 05/09/2021 1030      Wt Readings from  Encounters:  °02/27/22 280 lb 12.8 oz (127.4 kg)  °11/09/21 285 lb (129.3 kg)  °08/08/21 283 lb 12 oz (128.7 kg)  °  ° ° °Other studies Reviewed: °Additional studies/ records that were reviewed today include: Labs and primary care office notes. °Review of the above records demonstrates:  Please see elsewhere in the note.   ° ° °ASSESSMENT AND PLAN: ° °  °S/P CABG x 4 °The patient does have some chest discomfort but did not want to have further testing.  I suggested stress testing although I think the pretest probability of this being obstructive coronary disease is somewhat low.  He says he does not want to have this testing but would let me know if he has recurrent symptoms and is having to take increased nitroglycerin.  ° °Ischemic cardiomyopathy °EF was mildly reduced before.  I am going to switch him from lisinopril which will be stopped for a day and a half and he will start on Entresto 24/26 twice daily.  I would like him to get follow-up in about 6 weeks with an APP.  He should have a be met probably in about 2 weeks.  We can then titrate his meds and follow-up with an echo in the future. °  °COPD (chronic obstructive pulmonary disease) with emphysema (HCC) °He has previously seen Dr. Sood ° °Essential hypertension °This is being managed in the context of treating his CHF ° °Type 2 diabetes mellitus with diabetic neuropathy (HCC) °I do see that his A1c is 8.1 but I reviewed his primary care notes and Dr. Letvak, Richard I, MD did not want to adjust his meds but talking about some lifestyle.  ° °OSA (obstructive sleep apnea) °He has used CPAP. ° °Current medicines are reviewed at length with the patient today.  The patient does not have concerns regarding medicines. ° °The following changes have been made:   ° °Labs/ tests ordered today include:  ° °Orders Placed This Encounter  °Procedures  ° Basic metabolic panel  ° EKG 12-Lead  ° ° ° °Disposition:   FU with APP in about six weeks.  For med  titration. ° ° °Signed, °James Hochrein, MD  °02/27/2022 12:03 PM    °[facility] ° ° ° °

## 2022-02-27 ENCOUNTER — Ambulatory Visit (INDEPENDENT_AMBULATORY_CARE_PROVIDER_SITE_OTHER): Payer: Medicare Other | Admitting: Cardiology

## 2022-02-27 ENCOUNTER — Other Ambulatory Visit: Payer: Self-pay

## 2022-02-27 ENCOUNTER — Encounter: Payer: Self-pay | Admitting: Cardiology

## 2022-02-27 ENCOUNTER — Other Ambulatory Visit: Payer: Self-pay | Admitting: Vascular Surgery

## 2022-02-27 VITALS — BP 122/60 | HR 62 | Ht 70.0 in | Wt 280.8 lb

## 2022-02-27 DIAGNOSIS — E1140 Type 2 diabetes mellitus with diabetic neuropathy, unspecified: Secondary | ICD-10-CM

## 2022-02-27 DIAGNOSIS — I255 Ischemic cardiomyopathy: Secondary | ICD-10-CM

## 2022-02-27 DIAGNOSIS — J449 Chronic obstructive pulmonary disease, unspecified: Secondary | ICD-10-CM

## 2022-02-27 DIAGNOSIS — I10 Essential (primary) hypertension: Secondary | ICD-10-CM

## 2022-02-27 DIAGNOSIS — Z95818 Presence of other cardiac implants and grafts: Secondary | ICD-10-CM

## 2022-02-27 DIAGNOSIS — Z951 Presence of aortocoronary bypass graft: Secondary | ICD-10-CM | POA: Diagnosis not present

## 2022-02-27 DIAGNOSIS — I25110 Atherosclerotic heart disease of native coronary artery with unstable angina pectoris: Secondary | ICD-10-CM

## 2022-02-27 DIAGNOSIS — I5022 Chronic systolic (congestive) heart failure: Secondary | ICD-10-CM

## 2022-02-27 DIAGNOSIS — E785 Hyperlipidemia, unspecified: Secondary | ICD-10-CM

## 2022-02-27 DIAGNOSIS — G4733 Obstructive sleep apnea (adult) (pediatric): Secondary | ICD-10-CM

## 2022-02-27 DIAGNOSIS — R5383 Other fatigue: Secondary | ICD-10-CM

## 2022-02-27 DIAGNOSIS — I2510 Atherosclerotic heart disease of native coronary artery without angina pectoris: Secondary | ICD-10-CM

## 2022-02-27 DIAGNOSIS — I200 Unstable angina: Secondary | ICD-10-CM | POA: Diagnosis not present

## 2022-02-27 MED ORDER — ISOSORBIDE MONONITRATE ER 30 MG PO TB24: ORAL_TABLET | ORAL | 3 refills | Status: DC

## 2022-02-27 MED ORDER — ENTRESTO 24-26 MG PO TABS: 1.0000 | ORAL_TABLET | Freq: Two times a day (BID) | ORAL | 4 refills | Status: DC

## 2022-02-27 MED ORDER — FUROSEMIDE 20 MG PO TABS: 20.0000 mg | ORAL_TABLET | Freq: Every day | ORAL | 3 refills | Status: DC

## 2022-02-27 MED ORDER — EMPAGLIFLOZIN 10 MG PO TABS: 10.0000 mg | ORAL_TABLET | Freq: Every day | ORAL | 2 refills | Status: DC

## 2022-02-27 NOTE — Patient Instructions (Addendum)
Medication Instructions:  ? ?Stop taking Lisinopril  after 36 hours  start talking  Entresto 24/26 one tablet twice a day  ? ? ?*If you need a refill on your cardiac medications before your next appointment, please call your pharmacy* ? ? ?Lab Work: ? ? BMP - in 2 weeks  ? ?If you have labs (blood work) drawn today and your tests are completely normal, you will receive your results only by: ?MyChart Message (if you have MyChart) OR ?A paper copy in the mail ?If you have any lab test that is abnormal or we need to change your treatment, we will call you to review the results. ? ? ?Testing/Procedures: ?Not needed ? ? ?Follow-Up: ?At [facility], you and your health needs are our priority.  As part of our continuing mission to provide you with exceptional heart care, we have created designated Provider Care Teams.  These Care Teams include your primary Cardiologist (physician) and Advanced Practice Providers (APPs -  Physician Assistants and Nurse Practitioners) who all work together to provide you with the care you need, when you need it. ? ?We recommend signing up for the patient portal called "MyChart".  Sign up information is provided on this After Visit Summary.  MyChart is used to connect with patients for Virtual Visits (Telemedicine).  Patients are able to view lab/test results, encounter notes, upcoming appointments, etc.  Non-urgent messages can be sent to your provider as well.   ?To learn more about what you can do with MyChart, go to [email or web address].   ? ?Your next appointment:   ?6 week(s) ? ?The format for your next appointment:   ?In Person ? ?Provider:   ?Sande Rives, PA-C, Almyra Deforest, PA-C, or Diona Browner, NP    ? ? ? ?

## 2022-03-13 DIAGNOSIS — J449 Chronic obstructive pulmonary disease, unspecified: Secondary | ICD-10-CM | POA: Diagnosis not present

## 2022-03-13 DIAGNOSIS — I255 Ischemic cardiomyopathy: Secondary | ICD-10-CM | POA: Diagnosis not present

## 2022-03-13 DIAGNOSIS — R5383 Other fatigue: Secondary | ICD-10-CM | POA: Diagnosis not present

## 2022-03-13 DIAGNOSIS — Z951 Presence of aortocoronary bypass graft: Secondary | ICD-10-CM | POA: Diagnosis not present

## 2022-03-13 LAB — BASIC METABOLIC PANEL
BUN/Creatinine Ratio: 13 (ref 10–24)
BUN: 14 mg/dL (ref 8–27)
CO2: 25 mmol/L (ref 20–29)
Calcium: 9.3 mg/dL (ref 8.6–10.2)
Chloride: 102 mmol/L (ref 96–106)
Creatinine, Ser: 1.04 mg/dL (ref 0.76–1.27)
Glucose: 153 mg/dL — ABNORMAL HIGH (ref 70–99)
Potassium: 5.2 mmol/L (ref 3.5–5.2)
Sodium: 141 mmol/L (ref 134–144)
eGFR: 80 mL/min/{1.73_m2} (ref >59)

## 2022-04-08 ENCOUNTER — Ambulatory Visit: Payer: Medicare Other | Admitting: Nurse Practitioner

## 2022-04-08 ENCOUNTER — Other Ambulatory Visit: Payer: Self-pay | Admitting: Cardiology

## 2022-04-08 ENCOUNTER — Encounter: Payer: Self-pay | Admitting: Nurse Practitioner

## 2022-04-08 ENCOUNTER — Other Ambulatory Visit: Payer: Self-pay

## 2022-04-08 VITALS — BP 137/82 | HR 66 | Ht 70.0 in | Wt 281.4 lb

## 2022-04-08 DIAGNOSIS — I255 Ischemic cardiomyopathy: Secondary | ICD-10-CM

## 2022-04-08 DIAGNOSIS — I10 Essential (primary) hypertension: Secondary | ICD-10-CM | POA: Diagnosis not present

## 2022-04-08 DIAGNOSIS — I739 Peripheral vascular disease, unspecified: Secondary | ICD-10-CM

## 2022-04-08 DIAGNOSIS — I2510 Atherosclerotic heart disease of native coronary artery without angina pectoris: Secondary | ICD-10-CM | POA: Diagnosis not present

## 2022-04-08 DIAGNOSIS — Z79899 Other long term (current) drug therapy: Secondary | ICD-10-CM

## 2022-04-08 DIAGNOSIS — E785 Hyperlipidemia, unspecified: Secondary | ICD-10-CM

## 2022-04-08 DIAGNOSIS — J449 Chronic obstructive pulmonary disease, unspecified: Secondary | ICD-10-CM

## 2022-04-08 DIAGNOSIS — Z95828 Presence of other vascular implants and grafts: Secondary | ICD-10-CM

## 2022-04-08 DIAGNOSIS — E1140 Type 2 diabetes mellitus with diabetic neuropathy, unspecified: Secondary | ICD-10-CM

## 2022-04-08 DIAGNOSIS — G4733 Obstructive sleep apnea (adult) (pediatric): Secondary | ICD-10-CM

## 2022-04-08 DIAGNOSIS — I5022 Chronic systolic (congestive) heart failure: Secondary | ICD-10-CM

## 2022-04-08 MED ORDER — ENTRESTO 49-51 MG PO TABS: 1.0000 | ORAL_TABLET | Freq: Two times a day (BID) | ORAL | 3 refills | Status: DC

## 2022-04-08 NOTE — Patient Instructions (Signed)
Medication Instructions:  ?Increase Entresto 49-51 mg twice daily. ? ?*If you need a refill on your cardiac medications before your next appointment, please call your pharmacy* ? ? ?Lab Work: ?Your physician recommends that you return for lab work in 2 weeks ?BMET ? ?If you have labs (blood work) drawn today and your tests are completely normal, you will receive your results only by: ?MyChart Message (if you have MyChart) OR ?A paper copy in the mail ?If you have any lab test that is abnormal or we need to change your treatment, we will call you to review the results. ? ? ?Testing/Procedures: ?NONE ordered at this time of appointment  ? ? ?Follow-Up: ?At [facility], you and your health needs are our priority.  As part of our continuing mission to provide you with exceptional heart care, we have created designated Provider Care Teams.  These Care Teams include your primary Cardiologist (physician) and Advanced Practice Providers (APPs -  Physician Assistants and Nurse Practitioners) who all work together to provide you with the care you need, when you need it. ? ?We recommend signing up for the patient portal called "MyChart".  Sign up information is provided on this After Visit Summary.  MyChart is used to connect with patients for Virtual Visits (Telemedicine).  Patients are able to view lab/test results, encounter notes, upcoming appointments, etc.  Non-urgent messages can be sent to your provider as well.   ?To learn more about what you can do with MyChart, go to [email or web address].   ? ?Your next appointment:   ?2-3 month(s) ? ?The format for your next appointment:   ?In Person ? ?Provider:   ?Minus Breeding, MD   ? ? ?Other Instructions ?Referral sent to Vascular Surgery  ? ?Important Information About Sugar ? ? ? ? ? ? ?

## 2022-04-08 NOTE — Progress Notes (Signed)
? ? ?Office Visit  ?  ?Patient Name: [name] ?Date of Encounter: 04/08/2022 ? ?Primary Care Provider:  Venia Carbon, MD ?Primary Cardiologist:  Minus Breeding, MD ? ?Chief Complaint  ?  ?66 year old male with a history of CAD/P CABG x4 in 2021, L AAA clipping the time of his bypass surgery, chronic systolic heart failure/ICM s/p ICD, PVD, AAA s/p EVAR, hypertension, hyperlipidemia, OSA, COPD, type 2 diabetes, GERD, arthritis, and tobacco use who presents for follow-up related to CAD and ICM.  ? ?Past Medical History  ?  ?Past Medical History:  ?Diagnosis Date  ? Arthritis   ? Automatic implantable cardioverter-defibrillator in situ   ? COPD (chronic obstructive pulmonary disease) ([place])   ? emphysema by CXR  ? Coronary artery disease   ? Diabetes mellitus   ? type 2  no meds  ? Diverticulosis of colon   ? GERD (gastroesophageal reflux disease)   ? with esophagitis  ? History of colonic polyps   ? Hypertension   ? Non-ischemic cardiomyopathy ([place])   ? Presence of permanent cardiac pacemaker   ? PVD (peripheral vascular disease) ([place])   ? bilateral common iliac artery aneurysms. Right SFA occlusion over a long segment. Left SFA disease with occlusion of the left TP trunk. Artirogram Oct. 2006  ? Sleep apnea   ? Tobacco abuse   ? Urinary incontinence   ? detrussor instability  ? ?Past Surgical History:  ?Procedure Laterality Date  ? ABDOMINAL AORTIC ENDOVASCULAR STENT GRAFT N/A 05/07/2018  ? Procedure: ABDOMINAL AORTIC ENDOVASCULAR STENT GRAFT;  Surgeon: Conrad [place], MD;  Location: [place];  Service: Vascular;  Laterality: N/A;  ? CARDIAC CATHETERIZATION  06/2004  ? negative  ? CARDIAC CATHETERIZATION  02/08/2020  ? CLIPPING OF ATRIAL APPENDAGE N/A 02/11/2020  ? Procedure: Clipping Of Atrial Appendage using AtriCure 35 MM AtriClip.;  Surgeon: Wonda Olds, MD;  [facility];  Service: Open Heart Surgery;  Laterality: N/A;  ? COLONOSCOPY  04/2005  ? COPD exacerbation    ? CORONARY ANGIOPLASTY  01/09/2018   ? CORONARY ARTERY BYPASS GRAFT N/A 02/11/2020  ? Procedure: CORONARY ARTERY BYPASS GRAFTING (CABG) using LIMA to LAD; RIMA to PL; Endoscopic right greater saphenous vein harvest: SVC to Diag; SVC to OM1.;  Surgeon: Wonda Olds, MD;  Location: [place];  Service: Open Heart Surgery;  Laterality: N/A;  BILATERAL IMA  ? CORONARY STENT INTERVENTION N/A 01/10/2018  ? Procedure: CORONARY STENT INTERVENTION;  Surgeon: Lorretta Harp, MD;  Location: [place] [facility];  Service: Cardiovascular;  Laterality: N/A;  ? CORONARY/GRAFT ACUTE MI REVASCULARIZATION N/A 01/10/2018  ? Procedure: Coronary/Graft Acute MI Revascularization;  Surgeon: Lorretta Harp, MD;  Location: [place] [facility];  Service: Cardiovascular;  Laterality: N/A;  ? EMBOLIZATION Left 03/19/2018  ? EMBOLIZATION Left 03/19/2018  ? Procedure: EMBOLIZATION - Left Internal;  Surgeon: Conrad [place], MD;  Location: [place] [facility];  Service: Cardiovascular;  Laterality: Left;  ? EMBOLIZATION Right 04/16/2018  ? Procedure: EMBOLIZATION;  Surgeon: Conrad [place], MD;  Location: [place] [facility];  Service: Cardiovascular;  Laterality: Right;  ? ENDOVEIN HARVEST OF GREATER SAPHENOUS VEIN Right 02/11/2020  ? Procedure: [place] Of Greater Saphenous Vein using right lower extremity.;  Surgeon: Wonda Olds, MD;  Location: [facility];  Service: Open Heart Surgery;  Laterality: Right;  ? FEMORAL ARTERY EXPLORATION Right 05/07/2018  ? Procedure: FEMORAL ARTERY EXPLORATION, EXTENDED PROFUNDAPLASTY;  Surgeon: Conrad , MD;  Location: [facility];  Service: Vascular;  Laterality: Right;  ? INTRAOPERATIVE ARTERIOGRAM Right 05/07/2018  ? Procedure: INTRA OPERATIVE ARTERIOGRAM;  Surgeon: Conrad Grantley, MD;  Location: [place];  Service: Vascular;  Laterality: Right;  ? LEFT HEART CATH AND CORONARY ANGIOGRAPHY N/A 01/10/2018  ? Procedure: LEFT HEART CATH AND CORONARY ANGIOGRAPHY;  Surgeon: Lorretta Harp, MD;  Location: [place] [facility];  Service: Cardiovascular;   Laterality: N/A;  ? LEFT HEART CATH AND CORONARY ANGIOGRAPHY N/A 02/08/2020  ? Procedure: LEFT HEART CATH AND CORONARY ANGIOGRAPHY;  Surgeon: Troy Sine, MD;  Location: [place] [facility];  Service: Cardiovascular;  Laterality: N/A;  ? PACEMAKER INSERTION  11/2005  ? PACEMAKER LEAD REMOVAL N/A 10/17/2014  ? Procedure: PACEMAKER LEAD REMOVAL;  Surgeon: Evans Lance, MD;  Location: [place];  Service: Cardiovascular;  Laterality: N/A;  ? RENAL ANGIOGRAPHY Left 04/16/2018  ? Procedure: RENAL ANGIOGRAPHY;  Surgeon: Conrad [place], MD;  Location: [place] [facility];  Service: Cardiovascular;  Laterality: Left;  ? s/p ICD placement     ? Medtronic Maximo [phone] single chamber  ? TEE WITHOUT CARDIOVERSION N/A 02/11/2020  ? Procedure: TRANSESOPHAGEAL ECHOCARDIOGRAM (TEE);  Surgeon: Wonda Olds, MD;  Location: [place];  Service: Open Heart Surgery;  Laterality: N/A;  ? ? ?Allergies ? ?Allergies  ?Allergen Reactions  ? Spironolactone Other (See Comments)  ?  hyperkalemia  ? ? ?History of Present Illness  ?  ?66 year old male with the above past medical history including CAD/P CABG x4 in 2021, L AAA clipping the time of his bypass surgery, chronic systolic heart failure/ICM s/p ICD, PVD, AAA s/p EVAR, hypertension, hyperlipidemia, OSA, COPD, type 2 diabetes, GERD, arthritis, and tobacco use. ? ?Has a history of CAD initially diagnosed by cardiac cath in 2012, s/p NSTEMI, DES-p-mLCx, DES-mRCA in 2019. Cardiac catheterization in February 2021 showed severe multivessel CAD.  He underwent CABG x4 (LIMA-LAD; RIMA-PL; SVG-Diag; SVG-OM1) in February 2021.  He had left atrial clipping at the time of his CABG.  Additionally, he has a history of ICM, s/p ICD in 2012. At [facility], Dr. Lovena Le elected not to replace his ICD. Echocardiogram at the time of his bypass surgery showed EF 45 to 50%, mildly decreased LV function, LV global hypokinesis, G1 DD, no significant valvular abnormalities. Carotid Dopplers at the time of his surgery showed  1 to 39% B ICA stenosis.  EKG in November 2021 showed new lateral T wave inversion.  Lexiscan Myoview in December 2021 showed no evidence of ischemia.  He has a history of peripheral vascular disease and underwent aorto bi-iliac stent graft placement with left common femoral artery repair in May 2019, followed by vascular surgery.  Additionally, he has a history of COPD and follows with pulmonology.   ? ?He was last seen in the office on 02/27/2022 and reported some chest discomfort, though he declined further testing.  He was started on Entresto in the setting of ICM.  He presents today for follow-up. Since his last visit he has been stable from a cardiac standpoint.  He denies any recurrent chest pain.  He does have stable chronic dyspnea on exertion.  He has follow-up scheduled with pulmonology.  Overall, he is tolerating his medications and denies any new concerns today. ? ? ?Home Medications  ?  ?Current Outpatient Medications  ?Medication Sig Dispense Refill  ? albuterol (VENTOLIN HFA) 108 (90 Base) MCG/ACT inhaler Inhale 2 puffs into the lungs every 6 (six) hours as needed for  wheezing or shortness of breath. 18 g 0  ? ANORO ELLIPTA 62.5-25 MCG/INH AEPB TAKE 1 PUFF BY MOUTH EVERY DAY 60 each 6  ? aspirin 81 MG chewable tablet Chew 1 tablet (81 mg total) by mouth daily.    ? atorvastatin (LIPITOR) 80 MG tablet Take 1 tablet (80 mg total) by mouth daily at 6 PM. 90 tablet 3  ? clopidogrel (PLAVIX) 75 MG tablet TAKE 1 TABLET BY MOUTH EVERY DAY 90 tablet 3  ? empagliflozin (JARDIANCE) 10 MG TABS tablet Take 1 tablet (10 mg total) by mouth daily. 90 tablet 2  ? furosemide (LASIX) 20 MG tablet Take 1 tablet (20 mg total) by mouth daily. 90 tablet 3  ? gabapentin (NEURONTIN) 100 MG capsule TAKE 1 CAPSULE BY MOUTH 3 TIMES DAILY 90 capsule 3  ? glipiZIDE (GLUCOTROL) 5 MG tablet Take 1 tablet (5 mg total) by mouth 2 (two) times daily before a meal. 180 tablet 3  ? isosorbide mononitrate (IMDUR) 30 MG 24 hr tablet TAKE 1  TABLET BY MOUTH EVERY DAY IN THE EVENING 90 tablet 3  ? lisinopril (ZESTRIL) 2.5 MG tablet Take 2.5 mg by mouth daily.    ? metFORMIN (GLUCOPHAGE-XR) 500 MG 24 hr tablet TAKE 2 TABLETS BY MOUTH EVERY

## 2022-04-09 ENCOUNTER — Ambulatory Visit: Payer: Medicare Other | Admitting: Pulmonary Disease

## 2022-04-09 ENCOUNTER — Encounter: Payer: Self-pay | Admitting: Pulmonary Disease

## 2022-04-09 VITALS — BP 142/82 | HR 76 | Temp 97.7°F | Ht 70.0 in | Wt 278.8 lb

## 2022-04-09 DIAGNOSIS — J449 Chronic obstructive pulmonary disease, unspecified: Secondary | ICD-10-CM | POA: Diagnosis not present

## 2022-04-09 DIAGNOSIS — J849 Interstitial pulmonary disease, unspecified: Secondary | ICD-10-CM | POA: Diagnosis not present

## 2022-04-09 MED ORDER — TRELEGY ELLIPTA 100-62.5-25 MCG/ACT IN AEPB: 1.0000 | INHALATION_SPRAY | Freq: Every day | RESPIRATORY_TRACT | 5 refills | Status: DC

## 2022-04-09 MED ORDER — FLUTICASONE PROPIONATE 50 MCG/ACT NA SUSP: 1.0000 | Freq: Every day | NASAL | 2 refills | Status: DC

## 2022-04-09 NOTE — Patient Instructions (Signed)
Lab tests today ? ?Trelegy one puff daily, and rinse your mouth after each use ? ?Stop using anoro once you start using trelegy ? ?Flonase 1 spray in each nostril daily ? ?Will arrange for high resolution CT chest and pulmonary function test ? ?Follow up in 6 to 8 weeks with Dr. Halford Chessman or Nurse Practitioner ?

## 2022-04-09 NOTE — Progress Notes (Addendum)
? ?[facility] ? ?Chief Complaint  ?Patient presents with  ? Follow-up  ?  SOB with any activity   ? ? ?Past Surgical History:  ?He  has a past surgical history that includes s/p ICD placement ; Colonoscopy (04/2005); Cardiac catheterization (06/2004); Pacemaker insertion (11/2005); COPD exacerbation; Pacemaker lead removal (N/A, 10/17/2014); Coronary/Graft Acute MI Revascularization (N/A, 01/10/2018); LEFT HEART CATH AND CORONARY ANGIOGRAPHY (N/A, 01/10/2018); CORONARY STENT INTERVENTION (N/A, 01/10/2018); Coronary angioplasty (01/09/2018); Embolization (Left, 03/19/2018); EMBOLIZATION (Left, 03/19/2018); EMBOLIZATION (Right, 04/16/2018); RENAL ANGIOGRAPHY (Left, 04/16/2018); Femoral artery debridement (Right, 05/07/2018); Intraoperative arteriogram (Right, 05/07/2018); Abdominal aortic endovascular stent graft (N/A, 05/07/2018); Cardiac catheterization (02/08/2020); LEFT HEART CATH AND CORONARY ANGIOGRAPHY (N/A, 02/08/2020); Coronary artery bypass graft (N/A, 02/11/2020); TEE without cardioversion (N/A, 02/11/2020); Clipping of atrial appendage (N/A, 02/11/2020); and Endoharvest vein of greater saphenous vein (Right, 02/11/2020). ? ?Past Medical History:  ?OA, COPD, CAD s/p CABG, non ischemic CM, s/p AICD, DM type 2, Diverticulosis, GERD, Colon polyps, HTN, PAD ? ?Constitutional:  ?BP (!) 142/82 (BP Location: Left Arm, Patient Position: Sitting, Cuff Size: Normal)   Pulse 76   Temp 97.7 ?F (36.5 ?C) (Oral)   Ht '5\' 10"'$  (1.778 m)   Wt 278 lb 12.8 oz (126.5 kg)   SpO2 95%   BMI 40.00 kg/m?  ? ?Brief Summary:  ?CINCH ORMOND is a 66 y.o. male former smoker with obstructive sleep apnea and COPD. ?  ? ? ? ?Subjective:  ? ?He has more trouble with his breathing.  Can't do any activity.  Has cough with clear to green sputum.  Has post nasal drip.  Mouth gets dry, but no dry eye.  Not having skin rash, fever, joint swelling, or hemoptysis.  Using CPAP.  He had COVID 5 weeks  ago. ? ?Physical Exam:  ? ?Appearance - well kempt  ? ?ENMT - no sinus tenderness, no oral exudate, no LAN, Mallampati 3 airway, no stridor ? ?Respiratory - equal breath sounds bilaterally, no wheezing or rales ? ?CV - s1s2 regular rate and rhythm, no murmurs ? ?Ext - no clubbing, no edema ? ?Skin - no rashes ? ?Psych - normal mood and affect ?  ?Pulmonary testing:  ?PFT 09/22/20 >> FEV1 2.61 (52%), FEV1% 58, TLC 6.53 (96%), RV 3.27 (145%), DLCO 114% ? ?Chest Imaging:  ?CT chest 02/09/20 >> 5 nodule RUL, 3 mm nodule RML, fatty liver, atherosclerosis, calcifications in spleen ?CT chest 02/13/21 >> mild emphysema, bronchial thickening, few nodules up to 5 mm, scarring, patchy GGO and subpleural reticulation in lower lobes (probable UIP) ? ?Sleep Tests:  ?PSG 04/02/20 >> AHI 59, SpO2 low 85% ?CPAP titration 04/26/20 >> CPAP 11 cm H2O ?CPAP 03/09/22 to 04/07/22 >> used on 18 of 30 nights with average 8 hrs 25 min.  Average AHI 9.1 with median CPAP 11 and 95 th percentile CPAP 12 cm H2O ? ?Cardiac Tests:  ?Echo 02/08/20 >> EF 45 to 50%, mod LVH, grade 1 DD ? ?Social History:  ?He  reports that he quit smoking about 4 years ago. His smoking use included cigarettes. He has a 100.00 pack-year smoking history. He has never used smokeless tobacco. He reports that he does not drink alcohol and does not use drugs. ? ?Family History:  ?His family history includes Cancer in his brother and sister; Coronary artery disease in his maternal aunt; Heart failure in his maternal aunt; Lung cancer in his maternal aunt and maternal uncle; Stroke in his father. ?  ? ? ?  Assessment/Plan:  ? ?COPD mixed type with asthma component. ?- spiriva was ineffective ?- will transition from anoro to trelegy ?- prn albuterol ? ?Interstitial lung disease with UIP pattern on CT chest from 2022. ?- will arrange for PFT, HRCT chest, and serology ? ?Obstructive sleep apnea. ?- he is compliant with CPAP and reports benefit ?- he uses [employer] for his DME ?- continue  auto CPAP ?  ?Obesity. ?- he is aware of how his weight can impact his health ?  ?CAD s/p CABG, chronic systolic CHF. ?- followed by Dr. Percival Spanish with [facility] ? ?Time Spent Involved in Patient Care on Day of Examination:  ?39 minutes ? ?Follow up:  ? ?Patient Instructions  ?Lab tests today ? ?Trelegy one puff daily, and rinse your mouth after each use ? ?Stop using anoro once you start using trelegy ? ?Flonase 1 spray in each nostril daily ? ?Will arrange for high resolution CT chest and pulmonary function test ? ?Follow up in 6 to 8 weeks with Dr. Halford Chessman or Nurse Practitioner ? ?Medication List:  ? ?Allergies as of 04/09/2022   ? ?   Reactions  ? Spironolactone Other (See Comments)  ? hyperkalemia  ? ?  ? ?  ?Medication List  ?  ? ?  ? Accurate as of April 09, 2022  3:25 PM. If you have any questions, ask your nurse or doctor.  ?  ?  ? ?  ? ?STOP taking these medications   ? ?Anoro Ellipta 62.5-25 MCG/ACT Aepb ?Generic drug: umeclidinium-vilanterol ?Stopped by: Chesley Mires, MD ?  ? ?  ? ?TAKE these medications   ? ?albuterol 108 (90 Base) MCG/ACT inhaler ?Commonly known as: VENTOLIN HFA ?Inhale 2 puffs into the lungs every 6 (six) hours as needed for wheezing or shortness of breath. ?  ?aspirin 81 MG chewable tablet ?Chew 1 tablet (81 mg total) by mouth daily. ?  ?atorvastatin 80 MG tablet ?Commonly known as: LIPITOR ?Take 1 tablet (80 mg total) by mouth daily at 6 PM. ?  ?clopidogrel 75 MG tablet ?Commonly known as: PLAVIX ?TAKE 1 TABLET BY MOUTH EVERY DAY ?  ?empagliflozin 10 MG Tabs tablet ?Commonly known as: Jardiance ?Take 1 tablet (10 mg total) by mouth daily. ?  ?Entresto 49-51 MG ?Generic drug: sacubitril-valsartan ?Take 1 tablet by mouth 2 (two) times daily. ?  ?fluticasone 50 MCG/ACT nasal spray ?Commonly known as: FLONASE ?Place 1 spray into both nostrils daily. ?Started by: Chesley Mires, MD ?  ?furosemide 20 MG tablet ?Commonly known as: LASIX ?Take 1 tablet (20 mg total) by mouth daily. ?   ?gabapentin 100 MG capsule ?Commonly known as: NEURONTIN ?TAKE 1 CAPSULE BY MOUTH 3 TIMES DAILY ?  ?glipiZIDE 5 MG tablet ?Commonly known as: GLUCOTROL ?Take 1 tablet (5 mg total) by mouth 2 (two) times daily before a meal. ?  ?isosorbide mononitrate 30 MG 24 hr tablet ?Commonly known as: IMDUR ?TAKE 1 TABLET BY MOUTH EVERY DAY IN THE EVENING ?  ?lisinopril 2.5 MG tablet ?Commonly known as: ZESTRIL ?Take 1 tablet (2.5 mg total) by mouth daily. Keep scheduled appointment for further refills ?  ?metFORMIN 500 MG 24 hr tablet ?Commonly known as: GLUCOPHAGE-XR ?TAKE 2 TABLETS BY MOUTH EVERY DAY WITH BREAKFAST ?  ?metoprolol tartrate 25 MG tablet ?Commonly known as: LOPRESSOR ?Take 1 tablet (25 mg total) by mouth 2 (two) times daily. ?  ?multivitamin with minerals Tabs tablet ?Take 1 tablet by mouth daily. ?  ?nitroGLYCERIN 0.4 MG SL  tablet ?Commonly known as: NITROSTAT ?Place 1 tablet (0.4 mg total) under the tongue every 5 (five) minutes as needed for chest pain. ?  ?pantoprazole 40 MG tablet ?Commonly known as: PROTONIX ?TAKE 1 TABLET BY MOUTH EVERY DAY ?  ?Trelegy Ellipta 100-62.5-25 MCG/ACT Aepb ?Generic drug: Fluticasone-Umeclidin-Vilant ?Inhale 1 puff into the lungs daily in the afternoon. ?Started by: Chesley Mires, MD ?  ?vitamin B-12 1000 MCG tablet ?Commonly known as: CYANOCOBALAMIN ?Take 1,000 mcg by mouth daily. ?  ? ?  ? ? ?Signature:  ?Chesley Mires, MD ?[place] ?Pager - [phone] ?04/09/2022, 3:25 PM ?  ? ? ? ? ? ? ? ? ?

## 2022-04-10 LAB — COMPREHENSIVE METABOLIC PANEL
ALT: 22 U/L (ref 0–53)
AST: 18 U/L (ref 0–37)
Albumin: 4.3 g/dL (ref 3.5–5.2)
Alkaline Phosphatase: 99 U/L (ref 39–117)
BUN: 14 mg/dL (ref 6–23)
CO2: 23 mEq/L (ref 19–32)
Calcium: 9.3 mg/dL (ref 8.4–10.5)
Chloride: 101 mEq/L (ref 96–112)
Creatinine, Ser: 0.96 mg/dL (ref 0.40–1.50)
GFR: 83.02 mL/min (ref >60.00)
Glucose, Bld: 355 mg/dL — ABNORMAL HIGH (ref 70–99)
Potassium: 4.3 mEq/L (ref 3.5–5.1)
Sodium: 134 mEq/L — ABNORMAL LOW (ref 135–145)
Total Bilirubin: 0.7 mg/dL (ref 0.2–1.2)
Total Protein: 7.4 g/dL (ref 6.0–8.3)

## 2022-04-10 LAB — CBC WITH DIFFERENTIAL/PLATELET
Basophils Absolute: 0.1 10*3/uL (ref 0.0–0.1)
Basophils Relative: 1.3 % (ref 0.0–3.0)
Eosinophils Absolute: 0.1 10*3/uL (ref 0.0–0.7)
Eosinophils Relative: 1.2 % (ref 0.0–5.0)
HCT: 42 % (ref 39.0–52.0)
Hemoglobin: 13.5 g/dL (ref 13.0–17.0)
Lymphocytes Relative: 27.1 % (ref 12.0–46.0)
Lymphs Abs: 1.9 10*3/uL (ref 0.7–4.0)
MCHC: 32.2 g/dL (ref 30.0–36.0)
MCV: 67.3 fl — ABNORMAL LOW (ref 78.0–100.0)
Monocytes Absolute: 0.5 10*3/uL (ref 0.1–1.0)
Monocytes Relative: 6.9 % (ref 3.0–12.0)
Neutro Abs: 4.5 10*3/uL (ref 1.4–7.7)
Neutrophils Relative %: 63.5 % (ref 43.0–77.0)
Platelets: 334 10*3/uL (ref 150.0–400.0)
RBC: 6.23 Mil/uL — ABNORMAL HIGH (ref 4.22–5.81)
RDW: 20.8 % — ABNORMAL HIGH (ref 11.5–15.5)
WBC: 7.1 10*3/uL (ref 4.0–10.5)

## 2022-04-10 LAB — SEDIMENTATION RATE: Sed Rate: 6 mm/hr (ref 0–20)

## 2022-04-11 LAB — ANA W/REFLEX: ANA Titer 1: NEGATIVE

## 2022-04-14 LAB — IGE: IgE (Immunoglobulin E), Serum: 130 kU/L — ABNORMAL HIGH (ref <=114)

## 2022-04-14 LAB — ANCA SCREEN W REFLEX TITER: ANCA SCREEN: NEGATIVE

## 2022-04-14 LAB — RHEUMATOID FACTOR: Rheumatoid fact SerPl-aCnc: <14 IU/mL (ref <14)

## 2022-04-16 ENCOUNTER — Other Ambulatory Visit (HOSPITAL_COMMUNITY): Payer: Self-pay

## 2022-04-16 ENCOUNTER — Telehealth: Payer: Self-pay | Admitting: Pulmonary Disease

## 2022-04-16 NOTE — Telephone Encounter (Signed)
Called and spoke to patients wife (ok per dpr) went over results and she voiced understanding. She states the trelegy inhaler prescribed at last OV is too expensive and costs $177 with insurance. She would like to know if patient can go back to anoro inhaler or try a different inhaler. Dr. Halford Chessman please advise  ?

## 2022-04-16 NOTE — Telephone Encounter (Signed)
Pt is in their coverage GAP (donut hole). Pt will experice high copary until the coverage GAP amount has been reached. Test billing submitted for Anoro for a $116.60 copay and remaining GAP balance of $466.43 before "normal" copays will return.

## 2022-04-16 NOTE — Telephone Encounter (Signed)
Can we ask pharmacy team to review inhaler options with his insurance plan. ?

## 2022-04-16 NOTE — Telephone Encounter (Signed)
AtC patients wife to notify. Will send a mychart message as requested by her earlier on the phone if we could not reach her by phone.  ?

## 2022-04-16 NOTE — Telephone Encounter (Signed)
ATC patients wife (ok per dpr) LMTCB  ?

## 2022-04-17 NOTE — Telephone Encounter (Signed)
That's okay

## 2022-04-17 NOTE — Telephone Encounter (Signed)
Per patient on mychart message, he would like to continue using anoro until next OV. ? ?Dr. Halford Chessman are you okay with this?  ?

## 2022-04-17 NOTE — Progress Notes (Signed)
He has an appointment next month. ?We can push it up if needed---but if he wasn't fasting, it may not indicate a serious worsening of his already not optimal diabetes control. ?

## 2022-04-18 ENCOUNTER — Other Ambulatory Visit: Payer: Self-pay | Admitting: Pulmonary Disease

## 2022-04-18 ENCOUNTER — Other Ambulatory Visit: Payer: Self-pay | Admitting: Cardiology

## 2022-04-23 ENCOUNTER — Ambulatory Visit (INDEPENDENT_AMBULATORY_CARE_PROVIDER_SITE_OTHER)
Admission: RE | Admit: 2022-04-23 | Discharge: 2022-04-23 | Disposition: A | Discharge status: Home / Self Care | Payer: Medicare Other | Source: Ambulatory Visit | Attending: Pulmonary Disease | Admitting: Pulmonary Disease

## 2022-04-23 DIAGNOSIS — J929 Pleural plaque without asbestos: Secondary | ICD-10-CM | POA: Diagnosis not present

## 2022-04-23 DIAGNOSIS — J432 Centrilobular emphysema: Secondary | ICD-10-CM | POA: Diagnosis not present

## 2022-04-23 DIAGNOSIS — J849 Interstitial pulmonary disease, unspecified: Secondary | ICD-10-CM

## 2022-04-23 DIAGNOSIS — I2510 Atherosclerotic heart disease of native coronary artery without angina pectoris: Secondary | ICD-10-CM | POA: Diagnosis not present

## 2022-05-14 ENCOUNTER — Encounter: Payer: Self-pay | Admitting: Internal Medicine

## 2022-05-14 ENCOUNTER — Ambulatory Visit (INDEPENDENT_AMBULATORY_CARE_PROVIDER_SITE_OTHER): Payer: Medicare Other | Admitting: Internal Medicine

## 2022-05-14 VITALS — BP 126/80 | HR 62 | Temp 97.9°F | Ht 70.0 in | Wt 278.0 lb

## 2022-05-14 DIAGNOSIS — Z0000 Encounter for general adult medical examination without abnormal findings: Secondary | ICD-10-CM

## 2022-05-14 DIAGNOSIS — E1151 Type 2 diabetes mellitus with diabetic peripheral angiopathy without gangrene: Secondary | ICD-10-CM

## 2022-05-14 DIAGNOSIS — I480 Paroxysmal atrial fibrillation: Secondary | ICD-10-CM

## 2022-05-14 DIAGNOSIS — I25119 Atherosclerotic heart disease of native coronary artery with unspecified angina pectoris: Secondary | ICD-10-CM

## 2022-05-14 DIAGNOSIS — I739 Peripheral vascular disease, unspecified: Secondary | ICD-10-CM

## 2022-05-14 DIAGNOSIS — I5022 Chronic systolic (congestive) heart failure: Secondary | ICD-10-CM

## 2022-05-14 DIAGNOSIS — I714 Abdominal aortic aneurysm, without rupture, unspecified: Secondary | ICD-10-CM

## 2022-05-14 DIAGNOSIS — K2100 Gastro-esophageal reflux disease with esophagitis, without bleeding: Secondary | ICD-10-CM

## 2022-05-14 DIAGNOSIS — F39 Unspecified mood [affective] disorder: Secondary | ICD-10-CM

## 2022-05-14 DIAGNOSIS — J439 Emphysema, unspecified: Secondary | ICD-10-CM

## 2022-05-14 DIAGNOSIS — E1140 Type 2 diabetes mellitus with diabetic neuropathy, unspecified: Secondary | ICD-10-CM

## 2022-05-14 LAB — POCT GLYCOSYLATED HEMOGLOBIN (HGB A1C): Hemoglobin A1C: 8.1 % — AB (ref 4.0–5.6)

## 2022-05-14 LAB — HM DIABETES FOOT EXAM

## 2022-05-14 MED ORDER — GLIPIZIDE 10 MG PO TABS
10.0000 mg | ORAL_TABLET | Freq: Every day | ORAL | 3 refills | Status: DC
Start: 2022-05-14 — End: 2022-05-15

## 2022-05-14 MED ORDER — VALSARTAN 80 MG PO TABS: 80.0000 mg | ORAL_TABLET | Freq: Every day | ORAL | 3 refills | Status: DC

## 2022-05-14 MED ORDER — PANTOPRAZOLE SODIUM 40 MG PO TBEC: 40.0000 mg | DELAYED_RELEASE_TABLET | Freq: Two times a day (BID) | ORAL | 3 refills | Status: DC

## 2022-05-14 NOTE — Assessment & Plan Note (Signed)
??   Just post op No recent arrhythmia apparent ASA only  Metoprolol 25 bid

## 2022-05-14 NOTE — Assessment & Plan Note (Signed)
Stable angina pattern on isosorbide Occasionally uses the nitro SL ASA and atorvastatin 80

## 2022-05-14 NOTE — Assessment & Plan Note (Signed)
A1c slightly down to 8.1% He wasn't able to afford the jardiance Will increase glipizide 10 bid  Metformin 1000 daily--no increase due to bowel problems

## 2022-05-14 NOTE — Patient Instructions (Signed)
Increase the glipizide to '10mg'$  twice a day. Let me know if you have any low sugar symptoms. Start the valsartan 80 mg (takes the place of the entresto)

## 2022-05-14 NOTE — Addendum Note (Signed)
Addended by: Pilar Grammes on: 05/14/2022 09:01 AM   Modules accepted: Orders

## 2022-05-14 NOTE — Addendum Note (Signed)
Addended by: Viviana Simpler I on: 05/14/2022 08:51 AM   Modules accepted: Orders

## 2022-05-14 NOTE — Progress Notes (Signed)
Hearing Screening - Comments:: Has hearing aids. Wearing them today Vision Screening - Comments:: February 2023

## 2022-05-14 NOTE — Assessment & Plan Note (Signed)
Has stable claudication On statin and ASA

## 2022-05-14 NOTE — Assessment & Plan Note (Signed)
Still with symptoms Will increase protonix to twice a day

## 2022-05-14 NOTE — Assessment & Plan Note (Signed)
Off entresto due to cost Will start valsartan 80 instead On isosorbide 30, ASA 81, metoprolol 25 bid

## 2022-05-14 NOTE — Progress Notes (Signed)
Subjective:    Patient ID: [name], male    DOB: [birth year], 66 y.o.   MRN: [id number]  HPI Here for Medicare wellness visit and follow up of chronic health conditions Reviewed advanced directives Reviewed other doctors----Dr Hochrein--cardiology, Dr Johnette Abraham, Dr Alexis Goodell, Dr Rexene Alberts No hospitalizations or surgery in past year. No tobacco or alcohol Vision is okay---early cataracts Hearing aides do help 2 falls this year--no injury. Relates to disc and circulation issues No depression or anhedonia Wife does most of the instrumental ADLs Mild memory issues---nothing worrisome  Had to stop entresto, jardiance, trelegy all too expensive He went back to the anoro elilipta Ongoing SOB is stable Also some interstitial disease seen on CT Needs the rescue inhaler intermittently (if active)  Occasional slight "crampy" chest pain---goes away with rest No recent nitroglycerin No palpitations Sleeps in hospital bed with head up some---no PND No edema Some dizziness at times---goes away quickly. No syncope  Does have PVD Gets calf pain with increased walking----has to sit down or he will fall Walks with walker---rollator  BMI 39 Slight weight loss  Checks sugars occasionally Usually under 150 No foot burning or pain with nighttime gabapentin  Known AAA and iliac aneurysm on imaging   Current Outpatient Medications on File Prior to Visit  Medication Sig Dispense Refill   albuterol (VENTOLIN HFA) 108 (90 Base) MCG/ACT inhaler Inhale 2 puffs into the lungs every 6 (six) hours as needed for wheezing or shortness of breath. 18 g 0   aspirin 81 MG chewable tablet Chew 1 tablet (81 mg total) by mouth daily.     atorvastatin (LIPITOR) 80 MG tablet Take 1 tablet (80 mg total) by mouth daily at 6 PM. 90 tablet 3   clopidogrel (PLAVIX) 75 MG tablet TAKE 1 TABLET BY MOUTH EVERY DAY 90 tablet 3   fluticasone (FLONASE) 50 MCG/ACT nasal spray Place 1 spray into  both nostrils daily. 16 g 2   furosemide (LASIX) 20 MG tablet Take 1 tablet (20 mg total) by mouth daily. 90 tablet 3   gabapentin (NEURONTIN) 100 MG capsule TAKE 1 CAPSULE BY MOUTH 3 TIMES DAILY 90 capsule 3   glipiZIDE (GLUCOTROL) 5 MG tablet Take 1 tablet (5 mg total) by mouth 2 (two) times daily before a meal. 180 tablet 3   isosorbide mononitrate (IMDUR) 30 MG 24 hr tablet TAKE 1 TABLET BY MOUTH EVERY DAY IN THE EVENING 90 tablet 3   metFORMIN (GLUCOPHAGE-XR) 500 MG 24 hr tablet TAKE 2 TABLETS BY MOUTH EVERY DAY WITH BREAKFAST 180 tablet 3   metoprolol tartrate (LOPRESSOR) 25 MG tablet TAKE 1 TABLET BY MOUTH 2 TIMES A DAY 180 tablet 3   Multiple Vitamin (MULTIVITAMIN WITH MINERALS) TABS tablet Take 1 tablet by mouth daily.     pantoprazole (PROTONIX) 40 MG tablet TAKE 1 TABLET BY MOUTH EVERY DAY 90 tablet 3   umeclidinium-vilanterol (ANORO ELLIPTA) 62.5-25 MCG/ACT AEPB INHALE 1 PUFF INTO THE LUNGS BY MOUTH EVERY DAY 60 each 3   vitamin B-12 (CYANOCOBALAMIN) 1000 MCG tablet Take 1,000 mcg by mouth daily.     nitroGLYCERIN (NITROSTAT) 0.4 MG SL tablet Place 1 tablet (0.4 mg total) under the tongue every 5 (five) minutes as needed for chest pain. 25 tablet 3   No current facility-administered medications on file prior to visit.    Allergies  Allergen Reactions   Spironolactone Other (See Comments)    hyperkalemia    Past Medical History:  Diagnosis Date   Arthritis  Automatic implantable cardioverter-defibrillator in situ    COPD (chronic obstructive pulmonary disease) (HCC)    emphysema by CXR   Coronary artery disease    Diabetes mellitus    type 2  no meds   Diverticulosis of colon    GERD (gastroesophageal reflux disease)    with esophagitis   History of colonic polyps    Hypertension    Non-ischemic cardiomyopathy (HCC)    Presence of permanent cardiac pacemaker    PVD (peripheral vascular disease) ([place])    bilateral common iliac artery aneurysms. Right SFA occlusion  over a long segment. Left SFA disease with occlusion of the left TP trunk. Artirogram Oct. 2006   Sleep apnea    Tobacco abuse    Urinary incontinence    detrussor instability    Past Surgical History:  Procedure Laterality Date   ABDOMINAL AORTIC ENDOVASCULAR STENT GRAFT N/A 05/07/2018   Procedure: ABDOMINAL AORTIC ENDOVASCULAR STENT GRAFT;  Surgeon: Conrad Spaulding, MD;  Location: [facility];  Service: Vascular;  Laterality: N/A;   CARDIAC CATHETERIZATION  06/2004   negative   CARDIAC CATHETERIZATION  02/08/2020   CLIPPING OF ATRIAL APPENDAGE N/A 02/11/2020   Procedure: Clipping Of Atrial Appendage using AtriCure 35 MM AtriClip.;  Surgeon: Wonda Olds, MD;  Location: [facility];  Service: Open Heart Surgery;  Laterality: N/A;   COLONOSCOPY  04/2005   COPD exacerbation     CORONARY ANGIOPLASTY  01/09/2018   CORONARY ARTERY BYPASS GRAFT N/A 02/11/2020   Procedure: CORONARY ARTERY BYPASS GRAFTING (CABG) using LIMA to LAD; RIMA to PL; Endoscopic right greater saphenous vein harvest: SVC to Diag; SVC to OM1.;  Surgeon: Wonda Olds, MD;  Location: [facility];  Service: Open Heart Surgery;  Laterality: N/A;  BILATERAL IMA   CORONARY STENT INTERVENTION N/A 01/10/2018   Procedure: CORONARY STENT INTERVENTION;  Surgeon: Lorretta Harp, MD;  Location: [facility];  Service: Cardiovascular;  Laterality: N/A;   CORONARY/GRAFT ACUTE MI REVASCULARIZATION N/A 01/10/2018   Procedure: Coronary/Graft Acute MI Revascularization;  Surgeon: Lorretta Harp, MD;  Location: [facility];  Service: Cardiovascular;  Laterality: N/A;   EMBOLIZATION Left 03/19/2018   EMBOLIZATION Left 03/19/2018   Procedure: EMBOLIZATION - Left Internal;  Surgeon: Conrad Ridge, MD;  Location: [facility];  Service: Cardiovascular;  Laterality: Left;   EMBOLIZATION Right 04/16/2018   Procedure: EMBOLIZATION;  Surgeon: Conrad [place], MD;  Location: [facility];  Service: Cardiovascular;  Laterality: Right;    ENDOVEIN HARVEST OF GREATER SAPHENOUS VEIN Right 02/11/2020   Procedure: [place] Of Greater Saphenous Vein using right lower extremity.;  Surgeon: Wonda Olds, MD;  Location: [facility] OR;  Service: Open Heart Surgery;  Laterality: Right;   FEMORAL ARTERY EXPLORATION Right 05/07/2018   Procedure: FEMORAL ARTERY EXPLORATION, EXTENDED PROFUNDAPLASTY;  Surgeon: Conrad [place] Ridge, MD;  Location: [facility];  Service: Vascular;  Laterality: Right;   INTRAOPERATIVE ARTERIOGRAM Right 05/07/2018   Procedure: INTRA OPERATIVE ARTERIOGRAM;  Surgeon: Conrad [place]aguache, MD;  Location: [facility];  Service: Vascular;  Laterality: Right;   LEFT HEART CATH AND CORONARY ANGIOGRAPHY N/A 01/10/2018   Procedure: LEFT HEART CATH AND CORONARY ANGIOGRAPHY;  Surgeon: Lorretta Harp, MD;  Location: [place];  Service: Cardiovascular;  Laterality: N/A;   LEFT HEART CATH AND CORONARY ANGIOGRAPHY N/A 02/08/2020   Procedure: LEFT HEART CATH AND CORONARY ANGIOGRAPHY;  Surgeon: Troy Sine, MD;  Location: [facility];  Service: Cardiovascular;  Laterality: N/A;   PACEMAKER INSERTION  11/2005   PACEMAKER LEAD REMOVAL N/A 10/17/2014   Procedure: PACEMAKER LEAD REMOVAL;  Surgeon: Evans Lance, MD;  Location: [facility];  Service: Cardiovascular;  Laterality: N/A;   RENAL ANGIOGRAPHY Left 04/16/2018   Procedure: RENAL ANGIOGRAPHY;  Surgeon: Conrad Arden, MD;  Location: [facility];  Service: Cardiovascular;  Laterality: Left;   s/p ICD placement      Medtronic Maximo [phone] single chamber   TEE WITHOUT CARDIOVERSION N/A 02/11/2020   Procedure: TRANSESOPHAGEAL ECHOCARDIOGRAM (TEE);  Surgeon: Wonda Olds, MD;  Location: [facility];  Service: Open Heart Surgery;  Laterality: N/A;    Family History  Problem Relation Age of Onset   Stroke Father    Coronary artery disease Maternal Aunt    Heart failure Maternal Aunt    Lung cancer Maternal Aunt    Lung cancer Maternal Uncle    Cancer Brother        Mouth   Cancer  Sister        throat    Social History   Socioeconomic History   Marital status: Married    Spouse name: Not on file   Number of children: 2   Years of education: Not on file   Highest education level: Not on file  Occupational History   Occupation: Grave digger--now disabled  Tobacco Use   Smoking status: Former    Packs/day: 2.50    Years: 40.00    Pack years: 100.00    Types: Cigarettes    Quit date: 01/09/2018    Years since quitting: 4.3   Smokeless tobacco: Never   Tobacco comments:    GAVE 1-800-QUIT-NOW  Vaping Use   Vaping Use: Never used  Substance and Sexual Activity   Alcohol use: No   Drug use: No   Sexual activity: Not Currently  Other Topics Concern   Not on file  Social History Narrative   No living will   Requests wife as health care POA--alternate is daughter or son   Would accept resuscitation but doesn't want prolonged ventilation   No tube feeds if cognitively unaware   Social Determinants of Health   Financial Resource Strain: Not on file  Food Insecurity: Not on file  Transportation Needs: Not on file  Physical Activity: Not on file  Stress: Not on file  Social Connections: Not on file  Intimate Partner Violence: Not on file   Review of Systems Appetite is okay Not great sleeper---despite using CPAP nightly Wears seat belt No teeth--doesn't see dentist Nocturia x 4-5---the flow is fine. Some urgency--especially after diuretics Some acid reflux--despite the protonix. No real dysphagia---gets tight at times Bowels move fine. Occasional urgency. No blood Does have chronic back pain--no meds    Objective:   Physical Exam Constitutional:      Appearance: Normal appearance.  HENT:     Mouth/Throat:     Comments: Edentulous --no lesions Eyes:     Conjunctiva/sclera: Conjunctivae normal.     Pupils: Pupils are equal, round, and reactive to light.  Cardiovascular:     Rate and Rhythm: Normal rate and regular rhythm.     Heart sounds:  No murmur heard.   No gallop.     Comments: Feet warm without pulses Pulmonary:     Effort: Pulmonary effort is normal.     Breath sounds: No wheezing or rales.  Abdominal:     Palpations: Abdomen is soft.  Tenderness: There is no abdominal tenderness.  Musculoskeletal:     Cervical back: Neck supple.     Right lower leg: No edema.     Left lower leg: No edema.  Lymphadenopathy:     Cervical: No cervical adenopathy.  Skin:    Findings: No lesion or rash.     Comments: No foot lesions  Neurological:     Mental Status: He is alert.     Comments: Mini-cog okay---clock good, recall 2/3 Normal sensation in feet  Psychiatric:        Mood and Affect: Mood normal.        Behavior: Behavior normal.           Assessment & Plan:

## 2022-05-14 NOTE — Assessment & Plan Note (Signed)
I have personally reviewed the Medicare Annual Wellness questionnaire and have noted  1. The patient's medical and social history  2. Their use of alcohol, tobacco or illicit drugs  3. Their current medications and supplements  4. The patient's functional ability including ADL's, fall risks, home safety risks and hearing or visual              impairment.  5. Diet and physical activities  6. Evidence for depression or mood disorders  The patients weight, height, BMI and visual acuity have been recorded in the chart  I have made referrals, counseling and provided education to the patient based review of the above and I have provided the pt with a written personalized care plan for preventive services.   I have provided you with a copy of your personalized plan for preventive services. Please take the time to review along with your updated medication list.  Prefers no cancer screening Discussed to try some exercise---at least resistance. Does have stationary bike now COVID booster and flu vaccine for the fall Consider shingrix at pharmacy

## 2022-05-14 NOTE — Assessment & Plan Note (Signed)
Neuropathy symptoms controlled with gabapentin 100 at bedtime

## 2022-05-14 NOTE — Assessment & Plan Note (Signed)
Had repair and stable PAD symptoms

## 2022-05-14 NOTE — Assessment & Plan Note (Signed)
This has improved Seems to accept his limitations better

## 2022-05-14 NOTE — Assessment & Plan Note (Signed)
Back on anoro due to cost Stable DOE

## 2022-05-15 ENCOUNTER — Telehealth: Payer: Self-pay | Admitting: Internal Medicine

## 2022-05-15 MED ORDER — GLIPIZIDE 10 MG PO TABS: 10.0000 mg | ORAL_TABLET | Freq: Two times a day (BID) | ORAL | 3 refills | Status: DC

## 2022-05-15 MED ORDER — PANTOPRAZOLE SODIUM 40 MG PO TBEC: 40.0000 mg | DELAYED_RELEASE_TABLET | Freq: Two times a day (BID) | ORAL | 3 refills | Status: DC

## 2022-05-15 NOTE — Telephone Encounter (Signed)
Spoke to pt's wife. In the office visit note, it says to start taking glipizide '10mg'$  twice a day. The prescription that was sent in to the pharmacy was for 10 mg once daily. Please clarify. We also need to change the directions on the pantoprazole to 2 times daily.

## 2022-05-15 NOTE — Telephone Encounter (Signed)
Pt wife called in for clarification of medication, the Glipizide on his ov notes showed to take 10 mg twice a day but on the script it shows: Take 1 tablet (10 mg total) by mouth daily before breakfast. Callback is [phone]

## 2022-05-21 ENCOUNTER — Encounter: Payer: Self-pay | Admitting: Nurse Practitioner

## 2022-05-21 ENCOUNTER — Ambulatory Visit: Payer: Medicare Other | Admitting: Nurse Practitioner

## 2022-05-21 VITALS — BP 120/78 | HR 69 | Temp 97.7°F | Ht 70.0 in | Wt 282.0 lb

## 2022-05-21 DIAGNOSIS — R918 Other nonspecific abnormal finding of lung field: Secondary | ICD-10-CM

## 2022-05-21 DIAGNOSIS — J449 Chronic obstructive pulmonary disease, unspecified: Secondary | ICD-10-CM | POA: Diagnosis not present

## 2022-05-21 DIAGNOSIS — J849 Interstitial pulmonary disease, unspecified: Secondary | ICD-10-CM | POA: Diagnosis not present

## 2022-05-21 DIAGNOSIS — G4733 Obstructive sleep apnea (adult) (pediatric): Secondary | ICD-10-CM | POA: Diagnosis not present

## 2022-05-21 DIAGNOSIS — J4489 Other specified chronic obstructive pulmonary disease: Secondary | ICD-10-CM | POA: Insufficient documentation

## 2022-05-21 MED ORDER — TRELEGY ELLIPTA 100-62.5-25 MCG/ACT IN AEPB: 1.0000 | INHALATION_SPRAY | Freq: Every day | RESPIRATORY_TRACT | 0 refills | Status: DC

## 2022-05-21 NOTE — Assessment & Plan Note (Signed)
CT chest from 01/2021 showed some findings concerning for probable UIP. Repeat HRCT this month showed significant improvement in groundglass opacities and scarring; overall favoring sequela of prior infection/inflammation and not UIP. Will wait on his PFTs to determine if there is any restrictive process.

## 2022-05-21 NOTE — Assessment & Plan Note (Signed)
Stable on recent scans, determined to be benign. We will discuss referral to lung cancer screening program at his follow up.

## 2022-05-21 NOTE — Assessment & Plan Note (Signed)
High symptom burden. Mild emphysema on CT scan. Did have a significant BD response on previous PFTs and bronchospasm. Believe he would greatly benefit from addition of ICS - provided him with samples of Trelegy and [place] patient assistance paperwork. Hopefully we can get him some help with coverage. PFTs never scheduled after last visit-schedule today for further eval.   Patient Instructions  Stop Anoro. Start Trelegy 1 puff daily. Brush tongue and rinse mouth afterwards. Please complete the patient assistance application and return so we can fax for you Continue Albuterol inhaler 2 puffs every 6 hours as needed for shortness of breath or wheezing. Notify if symptoms persist despite rescue inhaler/neb use. Continue flonase nasal spray 1-2 sprays each nostril daily  Pulmonary function testing scheduled today  CPAP titration study to see if you need BiPAP - someone will contact you for scheduling  Follow up after PFTs and CPAP titration with Dr. Halford Chessman or Katie Jamaurion Slemmer,NP. If symptoms do not improve or worsen, please contact office for sooner follow up or seek emergency care.

## 2022-05-21 NOTE — Progress Notes (Signed)
$'@Patient'Z$  ID: [name], male    DOB: [birth year], 66 y.o.   MRN: [id number]  Chief Complaint  Patient presents with   Follow-up    He wants to talk about the CT scan.     Referring provider: Venia Carbon, MD  HPI: 66 year old male, former smoker (100 pack years) followed for COPD with asthma and OSA on CPAP. He is a patient of Dr. Juanetta Gosling and last seen in office on 04/09/2022. Past medical history significant for HTN, CHF, PAF, CAD s/p CABG, AAA without rupture, DM II, GERD, OA, B12 deficiency, HLD, obesity.   TEST/EVENTS:  02/08/2020 echocardiogram: EF 45 to 50%, moderate LVH, G1 DD 04/02/2020 PSG: AHI 59, SPO2 low 85% 04/26/2020 CPAP titration: Optimal setting 11 cmH2O 09/22/2020 PFTs: FVC 77, FEV1 74, ratio 72, TLC 96, DLCOunc 114.  Significant bronchodilator response with 42% change 02/13/2021 CT chest without contrast: CAD and atherosclerosis.  There is mild centrilobular emphysema with mild diffuse bronchial wall thickening.  There are a few scattered small solid right pulmonary nodules, largest 5 mm in the right upper lobe.  Scattered small parenchymal bands in both lungs compatible with nonspecific mild postinfectious/postinflammatory scarring.  Patchy groundglass opacity and subpleural reticulation in the lower lobes with associated volume loss.  Unable to exclude UIP. 04/23/2022 HRCT chest: Atherosclerosis and CAD.  No LAD.  Mild centrilobular emphysema.  There is diffuse bilateral bronchial wall thickening.  Occasional small pulmonary nodules in the right lung are stable and definitively benign.  No new nodules identified.  There is somewhat asymmetrical, right greater than left bandlike scarring in the upper lobes as well as the right middle lobe.  There is minimal irregular and groundglass opacity predominantly involving the dependent right lung, significantly improved when compared to prior.  Overall constellation of findings favors sequela of prior infection or inflammation or  scarring.  There is mild lobular air trapping on expiratory phase of imaging.  04/09/2022: OV with Dr. Halford Chessman.  Reported that he has been having more trouble with his breathing and unable to perform activities as easily as he used to be.  Does have a cough with clear to green sputum and postnasal drip.  Reports that his mouth does get dry but no dry eye.  No symptoms of connective tissue disease.  Did have COVID 5 weeks ago.  No significant response to Spiriva; previously had been transitioned from this to Anoro.  Advised step up to Trelegy.  Ordered HRCT for further evaluation due to possible UIP pattern on CT chest from 2022.  Completed serologies which were negative.  Ordered PFTs.  Continues to be compliant with CPAP.  05/21/2022: Today - follow up Patient presents today with wife and daughter for follow-up to discuss CT scan results.  He was also supposed to undergo pulmonary function testing; however these did not ever get scheduled.  His HRCT showed findings consistent with scarring likely from past infectious or inflammatory process.  Today, he reports that his breathing is unchanged.  He continues to get short of breath with ADLs including showering and putting his shoes on.  He also feels like he gets more breathless at night, despite wearing his CPAP.  Feels like he doesn't get enough air at times.  He also has a daily cough, occasionally productive with clear sputum, and occasional wheezing.  He denies any hemoptysis, weight loss, anorexia, lower extremity swelling.  He never started on Trelegy after his last visit due to cost and being in  coverage.  He has multiple medications which are expensive and makes affordability an issue.  04/16/2022-05/15/2022 CPAP auto 10-20 cmH2O 21/30 days used; 53% > 4 hours; average usage on days used 7 hr 5 min Median pressure 10.8, max 12.6  Leaks median 4.1 AHI 8.5   Allergies  Allergen Reactions   Spironolactone Other (See Comments)    hyperkalemia     Immunization History  Administered Date(s) Administered   Fluad Quad(high Dose 65+) 11/09/2021   Influenza Split 10/26/2012   Influenza Whole 10/13/2007, 10/16/2009, 10/17/2010   Influenza,inj,Quad PF,6+ Mos 10/29/2013, 10/28/2014, 11/29/2015, 11/25/2016, 10/28/2017, 12/04/2018, 12/10/2019, 11/08/2020   Moderna SARS-COV2 Booster Vaccination 12/12/2020   Moderna Sars-Covid-2 Vaccination 03/23/2020, 04/22/2020   Pneumococcal Conjugate-13 10/28/2014   Pneumococcal Polysaccharide-23 10/13/2007   Td 12/24/2005   Tdap 12/04/2018    Past Medical History:  Diagnosis Date   Arthritis    Automatic implantable cardioverter-defibrillator in situ    COPD (chronic obstructive pulmonary disease) ([place])    emphysema by CXR   Coronary artery disease    Diabetes mellitus    type 2  no meds   Diverticulosis of colon    GERD (gastroesophageal reflux disease)    with esophagitis   History of colonic polyps    Hypertension    Non-ischemic cardiomyopathy ([place])    Presence of permanent cardiac pacemaker    PVD (peripheral vascular disease) ([place])    bilateral common iliac artery aneurysms. Right SFA occlusion over a long segment. Left SFA disease with occlusion of the left TP trunk. Artirogram Oct. 2006   Sleep apnea    Tobacco abuse    Urinary incontinence    detrussor instability    Tobacco History: Social History   Tobacco Use  Smoking Status Former   Packs/day: 2.50   Years: 40.00   Pack years: 100.00   Types: Cigarettes   Quit date: 01/09/2018   Years since quitting: 4.3  Smokeless Tobacco Never  Tobacco Comments   GAVE 1-800-QUIT-NOW   Counseling given: Not Answered Tobacco comments: GAVE 1-800-QUIT-NOW   Outpatient Medications Prior to Visit  Medication Sig Dispense Refill   albuterol (VENTOLIN HFA) 108 (90 Base) MCG/ACT inhaler Inhale 2 puffs into the lungs every 6 (six) hours as needed for wheezing or shortness of breath. 18 g 0   aspirin 81 MG chewable tablet Chew 1  tablet (81 mg total) by mouth daily.     atorvastatin (LIPITOR) 80 MG tablet Take 1 tablet (80 mg total) by mouth daily at 6 PM. 90 tablet 3   clopidogrel (PLAVIX) 75 MG tablet TAKE 1 TABLET BY MOUTH EVERY DAY 90 tablet 3   fluticasone (FLONASE) 50 MCG/ACT nasal spray Place 1 spray into both nostrils daily. 16 g 2   furosemide (LASIX) 20 MG tablet Take 1 tablet (20 mg total) by mouth daily. 90 tablet 3   gabapentin (NEURONTIN) 100 MG capsule TAKE 1 CAPSULE BY MOUTH 3 TIMES DAILY 90 capsule 3   glipiZIDE (GLUCOTROL) 10 MG tablet Take 1 tablet (10 mg total) by mouth 2 (two) times daily before a meal. 180 tablet 3   isosorbide mononitrate (IMDUR) 30 MG 24 hr tablet TAKE 1 TABLET BY MOUTH EVERY DAY IN THE EVENING 90 tablet 3   metFORMIN (GLUCOPHAGE-XR) 500 MG 24 hr tablet TAKE 2 TABLETS BY MOUTH EVERY DAY WITH BREAKFAST 180 tablet 3   metoprolol tartrate (LOPRESSOR) 25 MG tablet TAKE 1 TABLET BY MOUTH 2 TIMES A DAY 180 tablet 3   Multiple Vitamin (MULTIVITAMIN  WITH MINERALS) TABS tablet Take 1 tablet by mouth daily.     pantoprazole (PROTONIX) 40 MG tablet Take 1 tablet (40 mg total) by mouth 2 (two) times daily. 180 tablet 3   umeclidinium-vilanterol (ANORO ELLIPTA) 62.5-25 MCG/ACT AEPB INHALE 1 PUFF INTO THE LUNGS BY MOUTH EVERY DAY 60 each 3   valsartan (DIOVAN) 80 MG tablet Take 1 tablet (80 mg total) by mouth daily. 90 tablet 3   vitamin B-12 (CYANOCOBALAMIN) 1000 MCG tablet Take 1,000 mcg by mouth daily.     nitroGLYCERIN (NITROSTAT) 0.4 MG SL tablet Place 1 tablet (0.4 mg total) under the tongue every 5 (five) minutes as needed for chest pain. 25 tablet 3   No facility-administered medications prior to visit.     Review of Systems:   Constitutional: No weight loss or gain, night sweats, fevers, chills, +daytime fatigue HEENT: No headaches, difficulty swallowing, tooth/dental problems, or sore throat. No sneezing, itching, ear ache, nasal congestion, or post nasal drip CV:  + PND.  No  chest pain, orthopnea,swelling in lower extremities, anasarca, dizziness, palpitations, syncope Resp: +shortness of breath with exertion; chronic cough (unchanged); occasional wheeze. No excess mucus or change in color of mucus. No hemoptysis. No chest wall deformity GI:  No heartburn, indigestion, abdominal pain, nausea, vomiting, diarrhea, change in bowel habits, loss of appetite, bloody stools.  Skin: No rash, lesions, ulcerations MSK:  No joint pain or swelling.  No decreased range of motion.  No back pain. Neuro: No dizziness or lightheadedness.  Psych: No depression or anxiety. Mood stable.     Physical Exam:  BP 120/78 (BP Location: Left Arm, Cuff Size: Large)   Pulse 69   Temp 97.7 F (36.5 C) (Oral)   Ht '5\' 10"'$  (1.778 m)   Wt 282 lb (127.9 kg)   SpO2 97%   BMI 40.46 kg/m   GEN: Pleasant, interactive, chronically-ill appearing; morbidly obese; in no acute distress. HEENT:  Normocephalic and atraumatic. PERRLA. Sclera white. Nasal turbinates pink, moist and patent bilaterally. No rhinorrhea present. Oropharynx pink and moist, without exudate or edema. No lesions, ulcerations, or postnasal drip.  NECK:  Supple w/ fair ROM. No JVD present. Normal carotid impulses w/o bruits. Thyroid symmetrical with no goiter or nodules palpated. No lymphadenopathy.   CV: RRR, no m/r/g, no peripheral edema. Pulses intact, +2 bilaterally. No cyanosis, pallor or clubbing. PULMONARY:  Unlabored, regular breathing. Clear bilaterally A&P w/o wheezes/rales/rhonchi. No accessory muscle use. No dullness to percussion. GI: BS present and normoactive. Soft, non-tender to palpation. No organomegaly or masses detected. No CVA tenderness. MSK: No erythema, warmth or tenderness. Cap refil <2 sec all extrem. No deformities or joint swelling noted.  Neuro: A/Ox3. No focal deficits noted.   Skin: Warm, no lesions or rashe Psych: Normal affect and behavior. Judgement and thought content appropriate.     Lab  Results:  CBC    Component Value Date/Time   WBC 7.1 04/09/2022 1527   RBC 6.23 (H) 04/09/2022 1527   HGB 13.5 04/09/2022 1527   HGB 14.4 02/03/2020 1142   HCT 42.0 04/09/2022 1527   HCT 42.3 02/03/2020 1142   PLT 334.0 04/09/2022 1527   PLT 351 02/03/2020 1142   MCV 67.3 Repeated and verified X2. (L) 04/09/2022 1527   MCV 84 02/03/2020 1142   MCH 24.6 (L) 05/21/2020 1503   MCHC 32.2 04/09/2022 1527   RDW 20.8 (H) 04/09/2022 1527   RDW 14.8 02/03/2020 1142   LYMPHSABS 1.9 04/09/2022 1527   MONOABS  0.5 04/09/2022 1527   EOSABS 0.1 04/09/2022 1527   BASOSABS 0.1 04/09/2022 1527    BMET    Component Value Date/Time   NA 134 (L) 04/09/2022 1527   NA 141 03/13/2022 1005   K 4.3 04/09/2022 1527   CL 101 04/09/2022 1527   CO2 23 04/09/2022 1527   GLUCOSE 355 (H) 04/09/2022 1527   GLUCOSE 97 01/02/2007 0000   BUN 14 04/09/2022 1527   BUN 14 03/13/2022 1005   CREATININE 0.96 04/09/2022 1527   CALCIUM 9.3 04/09/2022 1527   GFRNONAA 82 01/01/2021 0842   GFRAA 95 01/01/2021 0842    BNP    Component Value Date/Time   BNP 26.1 01/09/2018 2323     Imaging:  CT Chest High Resolution  Result Date: 04/23/2022 CLINICAL DATA:  Interstitial lung disease, history of emphysema, increased shortness of breath EXAM: CT CHEST WITHOUT CONTRAST TECHNIQUE: Multidetector CT imaging of the chest was performed following the standard protocol without intravenous contrast. High resolution imaging of the lungs, as well as inspiratory and expiratory imaging, was performed. RADIATION DOSE REDUCTION: This exam was performed according to the departmental dose-optimization program which includes automated exposure control, adjustment of the mA and/or kV according to patient size and/or use of iterative reconstruction technique. COMPARISON:  02/13/2021 FINDINGS: Cardiovascular: Aortic atherosclerosis. Normal heart size. Three-vessel coronary artery calcifications status post median sternotomy and CABG.  Left atrial appendage occlusion clip. No pericardial effusion. Mediastinum/Nodes: No enlarged mediastinal, hilar, or axillary lymph nodes. Thyroid gland, trachea, and esophagus demonstrate no significant findings. Lungs/Pleura: Mild centrilobular emphysema. Diffuse bilateral bronchial wall thickening. Occasional small pulmonary nodules in the right lung are stable and definitively benign, for example a 0.5 cm nodule in the peripheral right upper lobe (series 4, image 93). No new nodules. Somewhat asymmetric, right greater than left bandlike scarring of the upper lobes as well as the right middle lobe (series 4, image 90). Minimal irregular and ground-glass opacity predominantly involving the dependent right lung base, significantly improved compared to relatively expiratory prior examination. Mild lobular air trapping on expiratory phase imaging. No pleural effusion or pneumothorax. Upper Abdomen: No acute abnormality. Musculoskeletal: No chest wall abnormality. No suspicious osseous lesions identified. IMPRESSION: 1. Somewhat asymmetric, right greater than left bandlike scarring of the upper lobes as well as the right middle lobe. Minimal irregular and ground-glass opacity predominantly involving the dependent right lung base, significantly improved compared to relatively expiratory prior examination. Overall constellation of findings strongly favors bland sequelae of prior infection or inflammation. Consider additional ongoing ILD protocol CT follow-up if there is high, persistent clinical suspicion for fibrotic interstitial lung disease. 2. Mild lobular air trapping on expiratory phase imaging, suggestive of small airways disease. 3. Stable, benign small right pulmonary nodules for which no further follow-up is required. 4. Emphysema and diffuse bilateral bronchial wall thickening. 5. Coronary artery disease. Aortic Atherosclerosis (ICD10-I70.0). Electronically Signed   By: Delanna Ahmadi M.D.   On: 04/23/2022  15:36         Latest Ref Rng & Units 09/22/2020    3:45 PM 02/09/2020   10:00 AM  PFT Results  FVC-Pre L 3.05   2.68    FVC-Predicted Pre % 67   59    FVC-Post L 3.48     FVC-Predicted Post % 77     Pre FEV1/FVC % % 58   77    Post FEV1/FCV % % 72     FEV1-Pre L 1.77   2.07    FEV1-Predicted  Pre % 52   61    FEV1-Post L 2.52     DLCO uncorrected ml/min/mmHg 30.11     DLCO UNC% % 114     DLCO corrected ml/min/mmHg 30.11     DLCO COR %Predicted % 114     DLVA Predicted % 118     TLC L 6.53     TLC % Predicted % 96     RV % Predicted % 145       No results found for: NITRICOXIDE      Assessment & Plan:   COPD with asthma (HCC) High symptom burden. Mild emphysema on CT scan. Did have a significant BD response on previous PFTs and bronchospasm. Believe he would greatly benefit from addition of ICS - provided him with samples of Trelegy and [place] patient assistance paperwork. Hopefully we can get him some help with coverage. PFTs never scheduled after last visit-schedule today for further eval.   Patient Instructions  Stop Anoro. Start Trelegy 1 puff daily. Brush tongue and rinse mouth afterwards. Please complete the patient assistance application and return so we can fax for you Continue Albuterol inhaler 2 puffs every 6 hours as needed for shortness of breath or wheezing. Notify if symptoms persist despite rescue inhaler/neb use. Continue flonase nasal spray 1-2 sprays each nostril daily  Pulmonary function testing scheduled today  CPAP titration study to see if you need BiPAP - someone will contact you for scheduling  Follow up after PFTs and CPAP titration with Dr. Halford Chessman or Katie Melvena Vink,NP. If symptoms do not improve or worsen, please contact office for sooner follow up or seek emergency care.    OSA (obstructive sleep apnea) Breakthrough events with PND and daytime fatigue. His median pressure usage is 10.8 and 95th is 11.8 so do not think we need to adjust his pressures.  He feels like he doesn't get enough air and gasps occasionally. Concerned he may need BiPAP - CPAP titration ordered for further evaluation. Advised he increase his usage to minimum of 4 hours every night in interim, if possible. We discussed how untreated sleep apnea puts an individual at risk for cardiac arrhthymias, pulm HTN, DM, stroke and increases their risk for daytime accidents.  ILD (interstitial lung disease) ([place]) CT chest from 01/2021 showed some findings concerning for probable UIP. Repeat HRCT this month showed significant improvement in groundglass opacities and scarring; overall favoring sequela of prior infection/inflammation and not UIP. Will wait on his PFTs to determine if there is any restrictive process.   Pulmonary nodules Stable on recent scans, determined to be benign. We will discuss referral to lung cancer screening program at his follow up.   I spent 45 minutes of dedicated to the care of this patient on the date of this encounter to include pre-visit review of records, face-to-face time with the patient discussing conditions above, post visit ordering of testing, clinical documentation with the electronic health record, making appropriate referrals as documented, and communicating necessary findings to members of the patients care team.  Clayton Bibles, NP 05/21/2022  Pt aware and understands NP's role.

## 2022-05-21 NOTE — Patient Instructions (Signed)
Stop Anoro. Start Trelegy 1 puff daily. Brush tongue and rinse mouth afterwards. Please complete the patient assistance application and return so we can fax for you Continue Albuterol inhaler 2 puffs every 6 hours as needed for shortness of breath or wheezing. Notify if symptoms persist despite rescue inhaler/neb use. Continue flonase nasal spray 1-2 sprays each nostril daily  Pulmonary function testing scheduled today  CPAP titration study to see if you need BiPAP - someone will contact you for scheduling  Follow up after PFTs and CPAP titration with Dr. Halford Chessman or Katie Xan Ingraham,NP. If symptoms do not improve or worsen, please contact office for sooner follow up or seek emergency care.

## 2022-05-21 NOTE — Assessment & Plan Note (Signed)
Breakthrough events with PND and daytime fatigue. His median pressure usage is 10.8 and 95th is 11.8 so do not think we need to adjust his pressures. He feels like he doesn't get enough air and gasps occasionally. Concerned he may need BiPAP - CPAP titration ordered for further evaluation. Advised he increase his usage to minimum of 4 hours every night in interim, if possible. We discussed how untreated sleep apnea puts an individual at risk for cardiac arrhthymias, pulm HTN, DM, stroke and increases their risk for daytime accidents.

## 2022-05-22 ENCOUNTER — Ambulatory Visit (INDEPENDENT_AMBULATORY_CARE_PROVIDER_SITE_OTHER): Payer: Medicare Other | Admitting: Pulmonary Disease

## 2022-05-22 DIAGNOSIS — J849 Interstitial pulmonary disease, unspecified: Secondary | ICD-10-CM | POA: Diagnosis not present

## 2022-05-22 DIAGNOSIS — J449 Chronic obstructive pulmonary disease, unspecified: Secondary | ICD-10-CM

## 2022-05-22 LAB — PULMONARY FUNCTION TEST
DL/VA % pred: 100 %
DL/VA: 4.17 ml/min/mmHg/L
DLCO cor % pred: 96 %
DLCO cor: 25.08 ml/min/mmHg
DLCO unc % pred: 93 %
DLCO unc: 24.27 ml/min/mmHg
FEF 25-75 Post: 2.14 L/sec
FEF 25-75 Pre: 1.56 L/sec
FEF2575-%Change-Post: 37 %
FEF2575-%Pred-Post: 81 %
FEF2575-%Pred-Pre: 59 %
FEV1-%Change-Post: 9 %
FEV1-%Pred-Post: 77 %
FEV1-%Pred-Pre: 71 %
FEV1-Post: 2.56 L
FEV1-Pre: 2.35 L
FEV1FVC-%Change-Post: 4 %
FEV1FVC-%Pred-Pre: 94 %
FEV6-%Change-Post: 3 %
FEV6-%Pred-Post: 81 %
FEV6-%Pred-Pre: 78 %
FEV6-Post: 3.44 L
FEV6-Pre: 3.32 L
FEV6FVC-%Change-Post: 0 %
FEV6FVC-%Pred-Post: 105 %
FEV6FVC-%Pred-Pre: 105 %
FVC-%Change-Post: 4 %
FVC-%Pred-Post: 77 %
FVC-%Pred-Pre: 74 %
FVC-Post: 3.45 L
FVC-Pre: 3.32 L
Post FEV1/FVC ratio: 74 %
Post FEV6/FVC ratio: 100 %
Pre FEV1/FVC ratio: 71 %
Pre FEV6/FVC Ratio: 100 %
RV % pred: 148 %
RV: 3.4 L
TLC % pred: 102 %
TLC: 6.96 L

## 2022-05-22 MED ORDER — TRELEGY ELLIPTA 100-62.5-25 MCG/ACT IN AEPB: 1.0000 | INHALATION_SPRAY | Freq: Every day | RESPIRATORY_TRACT | 5 refills | Status: DC

## 2022-05-22 NOTE — Addendum Note (Signed)
Addended by: Dessie Coma on: 05/22/2022 11:46 AM   Modules accepted: Orders

## 2022-05-22 NOTE — Progress Notes (Signed)
Reviewed and agree with assessment/plan.   Amontae Ng, MD [facility] 05/22/2022, 12:50 PM Pager:  [phone]  

## 2022-05-22 NOTE — Progress Notes (Signed)
PFT done today. 

## 2022-05-31 NOTE — Telephone Encounter (Signed)
Received the following message from patient:   "Good afternoon Dr Belenda Cruise.  Your nurse helped me with requesting assistance with the trelegy prescription.  I received a letter today and because I have insurance I do not qualify for assistance.  I'm still using the samples you gave me but I just can't afford the expense.  Is there another alternative?  Also results for my breathing test have been posted on my my chart.  I haven't received a call about the results. Thank you [name]"  Katie, do you have any other inhaler recommendations for him? Also, can you please advise about the PFT results? Thanks!

## 2022-05-31 NOTE — Telephone Encounter (Signed)
We can try to get him approved through [employer] for Breztri 2 puffs Twice daily. He can come pick up a sample today and complete the application. If we do not have any luck there, we can switch him to a two inhaler regimen but I'd prefer to start here. Thanks!   PFTs were overall stable when compared to his 2021 results, which is good. He did have a slight decline in his diffusion capacity (how well we exchange O2 and CO2) but he was still at 96% which is normal, so that is good news. No restrictive defect as his lung volumes were normal. Still has evidence of mild obstruction with borderline reversibility in FEV1 and midflow reversibility, consistent with COPD/asthma.

## 2022-06-05 ENCOUNTER — Telehealth: Payer: Self-pay | Admitting: *Deleted

## 2022-06-05 NOTE — Telephone Encounter (Signed)
Spoke with pt wife, patient has been approved for patient assistance with entresto. They were told to call [phone] for refills. Patient DY#[id number].

## 2022-06-14 ENCOUNTER — Other Ambulatory Visit: Payer: Self-pay | Admitting: Vascular Surgery

## 2022-06-14 ENCOUNTER — Other Ambulatory Visit: Payer: Self-pay | Admitting: *Deleted

## 2022-06-14 DIAGNOSIS — I714 Abdominal aortic aneurysm, without rupture, unspecified: Secondary | ICD-10-CM

## 2022-06-14 DIAGNOSIS — I745 Embolism and thrombosis of iliac artery: Secondary | ICD-10-CM

## 2022-06-14 DIAGNOSIS — I723 Aneurysm of iliac artery: Secondary | ICD-10-CM

## 2022-06-14 DIAGNOSIS — I739 Peripheral vascular disease, unspecified: Secondary | ICD-10-CM

## 2022-06-28 MED ORDER — BREZTRI AEROSPHERE 160-9-4.8 MCG/ACT IN AERO: 2.0000 | INHALATION_SPRAY | Freq: Two times a day (BID) | RESPIRATORY_TRACT | 0 refills | Status: DC

## 2022-07-04 NOTE — Progress Notes (Signed)
Office Note     CC: Peripheral vascular disease Requesting Provider:  Venia Carbon, MD  HPI: [name] is a 66 y.o. (1956-11-07) male presenting at the request of .Venia Carbon, MD to reestablish care.    [name] was last seen in 2019 having undergone EVAR with bilateral hypo gastric artery coil embolization through a left-sided common femoral artery cutdown.  The case was complicated by right lower extremity acute limb ischemia requiring femoral endarterectomy profundoplasty with bovine pericardial patch. All of the above was performed by Dr. Bridgett Larsson.  The aneurysms were initially found during an intervention for claudication.  Since seen last, [name] has undergone CABG.  He has had significant weight gain over the last few years, currently with a BMI of 40.  He cites back pain and bilateral lower extremity pain as reasons for his decreased activity level and weight gain.  [name] denies acute abdominal, back, chest pain.  He is able to walk roughly 50 yards prior to debilitating calf cramping bilateral, left greater than right.  This has been stable since 2019.    The pt is  on a statin for cholesterol management.  The pt is  on a daily aspirin.   Other AC:  plavix The pt is  on medication for hypertension.   The pt is  diabetic.  Tobacco hx:  former  Past Medical History:  Diagnosis Date   Arthritis    Automatic implantable cardioverter-defibrillator in situ    COPD (chronic obstructive pulmonary disease) (HCC)    emphysema by CXR   Coronary artery disease    Diabetes mellitus    type 2  no meds   Diverticulosis of colon    GERD (gastroesophageal reflux disease)    with esophagitis   History of colonic polyps    Hypertension    Non-ischemic cardiomyopathy (HCC)    Presence of permanent cardiac pacemaker    PVD (peripheral vascular disease) ([place])    bilateral common iliac artery aneurysms. Right SFA occlusion over a long segment. Left SFA disease with occlusion of the  left TP trunk. Artirogram Oct. 2006   Sleep apnea    Tobacco abuse    Urinary incontinence    detrussor instability    Past Surgical History:  Procedure Laterality Date   ABDOMINAL AORTIC ENDOVASCULAR STENT GRAFT N/A 05/07/2018   Procedure: ABDOMINAL AORTIC ENDOVASCULAR STENT GRAFT;  Surgeon: Conrad [place], MD;  Location: [facility];  Service: Vascular;  Laterality: N/A;   CARDIAC CATHETERIZATION  06/2004   negative   CARDIAC CATHETERIZATION  02/08/2020   CLIPPING OF ATRIAL APPENDAGE N/A 02/11/2020   Procedure: Clipping Of Atrial Appendage using AtriCure 35 MM AtriClip.;  Surgeon: Wonda Olds, MD;  Location: [facility];  Service: Open Heart Surgery;  Laterality: N/A;   COLONOSCOPY  04/2005   COPD exacerbation     CORONARY ANGIOPLASTY  01/09/2018   CORONARY ARTERY BYPASS GRAFT N/A 02/11/2020   Procedure: CORONARY ARTERY BYPASS GRAFTING (CABG) using LIMA to LAD; RIMA to PL; Endoscopic right greater saphenous vein harvest: SVC to Diag; SVC to OM1.;  Surgeon: Wonda Olds, MD;  Location: [place];  Service: Open Heart Surgery;  Laterality: N/A;  BILATERAL IMA   CORONARY STENT INTERVENTION N/A 01/10/2018   Procedure: CORONARY STENT INTERVENTION;  Surgeon: Lorretta Harp, MD;  Location: [place] [facility];  Service: Cardiovascular;  Laterality: N/A;   CORONARY/GRAFT ACUTE MI REVASCULARIZATION N/A 01/10/2018   Procedure: Coronary/Graft Acute MI Revascularization;  Surgeon: Gwenlyn Found,  Pearletha Forge, MD;  Location: [place] [facility];  Service: Cardiovascular;  Laterality: N/A;   EMBOLIZATION Left 03/19/2018   EMBOLIZATION Left 03/19/2018   Procedure: EMBOLIZATION - Left Internal;  Surgeon: Conrad Dutton, MD;  Location: [place] [facility];  Service: Cardiovascular;  Laterality: Left;   EMBOLIZATION Right 04/16/2018   Procedure: EMBOLIZATION;  Surgeon: Conrad [place], MD;  Location: [place] [facility];  Service: Cardiovascular;  Laterality: Right;   ENDOVEIN HARVEST OF GREATER SAPHENOUS VEIN Right 02/11/2020    Procedure: [place] Of Greater Saphenous Vein using right lower extremity.;  Surgeon: Wonda Olds, MD;  Location: [facility];  Service: Open Heart Surgery;  Laterality: Right;   FEMORAL ARTERY EXPLORATION Right 05/07/2018   Procedure: FEMORAL ARTERY EXPLORATION, EXTENDED PROFUNDAPLASTY;  Surgeon: Conrad Dalton, MD;  Location: [place];  Service: Vascular;  Laterality: Right;   INTRAOPERATIVE ARTERIOGRAM Right 05/07/2018   Procedure: INTRA OPERATIVE ARTERIOGRAM;  Surgeon: Conrad [place], MD;  Location: [place];  Service: Vascular;  Laterality: Right;   LEFT HEART CATH AND CORONARY ANGIOGRAPHY N/A 01/10/2018   Procedure: LEFT HEART CATH AND CORONARY ANGIOGRAPHY;  Surgeon: Lorretta Harp, MD;  Location: [place] [facility];  Service: Cardiovascular;  Laterality: N/A;   LEFT HEART CATH AND CORONARY ANGIOGRAPHY N/A 02/08/2020   Procedure: LEFT HEART CATH AND CORONARY ANGIOGRAPHY;  Surgeon: Troy Sine, MD;  Location: [place] [facility];  Service: Cardiovascular;  Laterality: N/A;   PACEMAKER INSERTION  11/2005   PACEMAKER LEAD REMOVAL N/A 10/17/2014   Procedure: PACEMAKER LEAD REMOVAL;  Surgeon: Evans Lance, MD;  Location: [place];  Service: Cardiovascular;  Laterality: N/A;   RENAL ANGIOGRAPHY Left 04/16/2018   Procedure: RENAL ANGIOGRAPHY;  Surgeon: Conrad [place], MD;  Location: [place] [facility];  Service: Cardiovascular;  Laterality: Left;   s/p ICD placement      Medtronic Maximo [phone] single chamber   TEE WITHOUT CARDIOVERSION N/A 02/11/2020   Procedure: TRANSESOPHAGEAL ECHOCARDIOGRAM (TEE);  Surgeon: Wonda Olds, MD;  Location: [place];  Service: Open Heart Surgery;  Laterality: N/A;    Social History   Socioeconomic History   Marital status: Married    Spouse name: Not on file   Number of children: 2   Years of education: Not on file   Highest education level: Not on file  Occupational History   Occupation: Grave digger--now disabled  Tobacco Use   Smoking status: Former     Packs/day: 2.50    Years: 40.00    Total pack years: 100.00    Types: Cigarettes    Quit date: 01/09/2018    Years since quitting: 4.4   Smokeless tobacco: Never   Tobacco comments:    GAVE 1-800-[id number]-NOW  Vaping Use   Vaping Use: Never used  Substance and Sexual Activity   Alcohol use: No   Drug use: No   Sexual activity: Not Currently  Other Topics Concern   Not on file  Social History Narrative   No living will   Requests wife as health care POA--alternate is daughter or son   Would accept resuscitation but doesn't want prolonged ventilation   No tube feeds if cognitively unaware   Social Determinants of Health   Financial Resource Strain: Not on file  Food Insecurity: Not on file  Transportation Needs: Not on file  Physical Activity: Not on file  Stress: Not on file  Social Connections: Not on file  Intimate Partner Violence: Not on  file   Family History  Problem Relation Age of Onset   Stroke Father    Coronary artery disease Maternal Aunt    Heart failure Maternal Aunt    Lung cancer Maternal Aunt    Lung cancer Maternal Uncle    Cancer Brother        Mouth   Cancer Sister        throat    Current Outpatient Medications  Medication Sig Dispense Refill   albuterol (VENTOLIN HFA) 108 (90 Base) MCG/ACT inhaler Inhale 2 puffs into the lungs every 6 (six) hours as needed for wheezing or shortness of breath. 18 g 0   aspirin 81 MG chewable tablet Chew 1 tablet (81 mg total) by mouth daily.     atorvastatin (LIPITOR) 80 MG tablet Take 1 tablet (80 mg total) by mouth daily at 6 PM. 90 tablet 3   Budeson-Glycopyrrol-Formoterol (BREZTRI AEROSPHERE) 160-9-4.8 MCG/ACT AERO Inhale 2 puffs into the lungs in the morning and at bedtime. 5.9 g 0   clopidogrel (PLAVIX) 75 MG tablet TAKE 1 TABLET BY MOUTH EVERY DAY 90 tablet 3   fluticasone (FLONASE) 50 MCG/ACT nasal spray Place 1 spray into both nostrils daily. 16 g 2   Fluticasone-Umeclidin-Vilant (TRELEGY ELLIPTA)  100-62.5-25 MCG/ACT AEPB Inhale 1 puff into the lungs daily. 60 each 5   furosemide (LASIX) 20 MG tablet Take 1 tablet (20 mg total) by mouth daily. 90 tablet 3   gabapentin (NEURONTIN) 100 MG capsule TAKE 1 CAPSULE BY MOUTH 3 TIMES DAILY 90 capsule 3   glipiZIDE (GLUCOTROL) 10 MG tablet Take 1 tablet (10 mg total) by mouth 2 (two) times daily before a meal. 180 tablet 3   isosorbide mononitrate (IMDUR) 30 MG 24 hr tablet TAKE 1 TABLET BY MOUTH EVERY DAY IN THE EVENING 90 tablet 3   metFORMIN (GLUCOPHAGE-XR) 500 MG 24 hr tablet TAKE 2 TABLETS BY MOUTH EVERY DAY WITH BREAKFAST 180 tablet 3   metoprolol tartrate (LOPRESSOR) 25 MG tablet TAKE 1 TABLET BY MOUTH 2 TIMES A DAY 180 tablet 3   Multiple Vitamin (MULTIVITAMIN WITH MINERALS) TABS tablet Take 1 tablet by mouth daily.     nitroGLYCERIN (NITROSTAT) 0.4 MG SL tablet Place 1 tablet (0.4 mg total) under the tongue every 5 (five) minutes as needed for chest pain. 25 tablet 3   pantoprazole (PROTONIX) 40 MG tablet Take 1 tablet (40 mg total) by mouth 2 (two) times daily. 180 tablet 3   valsartan (DIOVAN) 80 MG tablet Take 1 tablet (80 mg total) by mouth daily. 90 tablet 3   vitamin B-12 (CYANOCOBALAMIN) 1000 MCG tablet Take 1,000 mcg by mouth daily.     No current facility-administered medications for this visit.    Allergies  Allergen Reactions   Spironolactone Other (See Comments)    hyperkalemia     REVIEW OF SYSTEMS:  '[X]'$  denotes positive finding, '[ ]'$  denotes negative finding Cardiac  Comments:  Chest pain or chest pressure:    Shortness of breath upon exertion:    Short of breath when lying flat:    Irregular heart rhythm:        Vascular    Pain in calf, thigh, or hip brought on by ambulation: X   Pain in feet at night that wakes you up from your sleep:     Blood clot in your veins:    Leg swelling:         Pulmonary    Oxygen at home:  Productive cough:     Wheezing:         Neurologic    Sudden weakness in arms or  legs:     Sudden numbness in arms or legs:     Sudden onset of difficulty speaking or slurred speech:    Temporary loss of vision in one eye:     Problems with dizziness:         Gastrointestinal    Blood in stool:     Vomited blood:         Genitourinary    Burning when urinating:     Blood in urine:        Psychiatric    Major depression:         Hematologic    Bleeding problems:    Problems with blood clotting too easily:        Skin    Rashes or ulcers:        Constitutional    Fever or chills:      PHYSICAL EXAMINATION:  There were no vitals filed for this visit.  General:  WDWN in NAD; vital signs documented above Gait: Not observed HENT: WNL, normocephalic Pulmonary: normal non-labored breathing , without wheezing Cardiac: regular HR Abdomen: soft, NT, no masses Skin: without rashes Vascular Exam/Pulses:  Right Left  Radial 2+ (normal) 2+ (normal)  Ulnar 2+ (normal) 2+ (normal)  Femoral    Popliteal    DP 2+ (normal) 2+ (normal)       Extremities: without ischemic changes, without Gangrene , without cellulitis; without open wounds;  Musculoskeletal: no muscle wasting or atrophy  Neurologic: A&O X 3;  No focal weakness or paresthesias are detected Psychiatric:  The pt has Normal affect.   Non-Invasive Vascular Imaging:    +-------+-----------+-----------+------------+------------+  ABI/TBIToday's ABIToday's TBIPrevious ABIPrevious TBI  +-------+-----------+-----------+------------+------------+  Right  0.76       0.40       0.70        0.64          +-------+-----------+-----------+------------+------------+  Left   0.60       0.40       0.63        0.54          +-------+-----------+-----------+------------+------------+   Abdominal Aorta: Limited study due to overlying bowel gas and body  habitus. EVAR appears patent with no visible evidence of endoleak,  however, this is based on limited visualization. Maximum diameter of  5.72  cm may be unreliable due to limited  visualization.   ASSESSMENT/PLAN: [name] is a 66 y.o. male presenting to reestablish care status post 2019 EVAR with bilateral hypo gastric artery coil embolization through a left-sided common femoral artery cutdown.  The case was complicated by right lower extremity acute limb ischemia requiring femoral endarterectomy profundoplasty with bovine pericardial patch. All of the above was performed by Dr. Bridgett Larsson.   Initial follow-up imaging demonstrated aortic aneurysm sac size 4.3 cm, right common iliac artery aneurysm 6.4 cm, left common iliac artery aneurysm 5.4 cm.  Abdominal aortic ultrasound today demonstrates AAA diameter 5.7 to which is increased from 2019.  Common iliac artery aneurysms appear much smaller.  Being that there was limited visualization due to bowel gas, [name] would benefit from CT angio abdomen pelvis to further characterize abdominal aortic aneurysm growth status post repair.  On exam today, [name] was noted to have significant weight gain from his last visit.  He cited bilateral lower extremity lifestyle limiting claudication,  left greater than right. Of note, ABIs unchanged from previous, however significant decrease in toe pressure likely due to progression of microvascular disease from diabetes. We will further discuss bilateral lower extremity    Broadus Ronnel, MD [facility] [phone]

## 2022-07-05 ENCOUNTER — Ambulatory Visit: Payer: Medicare Other | Admitting: Vascular Surgery

## 2022-07-05 ENCOUNTER — Ambulatory Visit (HOSPITAL_COMMUNITY)
Admission: RE | Admit: 2022-07-05 | Discharge: 2022-07-05 | Disposition: A | Discharge status: Home / Self Care | Payer: Medicare Other | Source: Ambulatory Visit | Attending: Vascular Surgery | Admitting: Vascular Surgery

## 2022-07-05 ENCOUNTER — Encounter: Payer: Self-pay | Admitting: Vascular Surgery

## 2022-07-05 ENCOUNTER — Ambulatory Visit (INDEPENDENT_AMBULATORY_CARE_PROVIDER_SITE_OTHER)
Admission: RE | Admit: 2022-07-05 | Discharge: 2022-07-05 | Disposition: A | Discharge status: Home / Self Care | Payer: Medicare Other | Source: Ambulatory Visit | Attending: Vascular Surgery | Admitting: Vascular Surgery

## 2022-07-05 VITALS — BP 135/86 | HR 78 | Temp 98.3°F | Resp 20 | Ht 70.0 in | Wt 284.9 lb

## 2022-07-05 DIAGNOSIS — I739 Peripheral vascular disease, unspecified: Secondary | ICD-10-CM | POA: Insufficient documentation

## 2022-07-05 DIAGNOSIS — I714 Abdominal aortic aneurysm, without rupture, unspecified: Secondary | ICD-10-CM | POA: Insufficient documentation

## 2022-07-05 DIAGNOSIS — I745 Embolism and thrombosis of iliac artery: Secondary | ICD-10-CM | POA: Insufficient documentation

## 2022-07-05 DIAGNOSIS — I723 Aneurysm of iliac artery: Secondary | ICD-10-CM

## 2022-07-05 DIAGNOSIS — Z9889 Other specified postprocedural states: Secondary | ICD-10-CM | POA: Diagnosis not present

## 2022-07-05 DIAGNOSIS — Z8679 Personal history of other diseases of the circulatory system: Secondary | ICD-10-CM | POA: Diagnosis not present

## 2022-07-17 ENCOUNTER — Other Ambulatory Visit: Payer: Self-pay

## 2022-07-17 DIAGNOSIS — I723 Aneurysm of iliac artery: Secondary | ICD-10-CM

## 2022-07-18 ENCOUNTER — Ambulatory Visit (HOSPITAL_BASED_OUTPATIENT_CLINIC_OR_DEPARTMENT_OTHER): Payer: Medicare Other | Attending: Nurse Practitioner | Admitting: Pulmonary Disease

## 2022-07-18 DIAGNOSIS — G4733 Obstructive sleep apnea (adult) (pediatric): Secondary | ICD-10-CM | POA: Insufficient documentation

## 2022-07-28 NOTE — Progress Notes (Signed)
Cardiology Office Note   Date:  07/29/2022   ID:  [name], [name] October 31, 1956, MRN [id number]  PCP:  Venia Carbon, MD  Cardiologist:   Minus Breeding, MD   Chief Complaint  Patient presents with   Shortness of Breath      History of Present Illness: [name] is a 66 y.o. male who presents for follow up of CAD.  He has anatomy as below.  He has had continued chest pain and wanted medical management.  He underwent CABG x4 (LIMA-LAD; RIMA-PL; SVG-Diag; SVG-OM1) in February 2021.  He is now have pulmonary function testing and follow-up with pulmonary.  He has had med changes but his biggest complaint is still shortness of breath.  He is not having any of the chest discomfort that he had previously and has not had taken nitroglycerin but has been very limited.  He has back problems.  He has leg problems being followed for peripheral vascular disease and is due for angiogram soon.  However, he is very limited in walking a short distance on level ground through his house.  He is not describing PND or orthopnea.  He is not describing new palpitations, presyncope or syncope.   Past Medical History:  Diagnosis Date   Arthritis    Automatic implantable cardioverter-defibrillator in situ    COPD (chronic obstructive pulmonary disease) (HCC)    emphysema by CXR   Coronary artery disease    Diabetes mellitus    type 2  no meds   Diverticulosis of colon    GERD (gastroesophageal reflux disease)    with esophagitis   History of colonic polyps    Hypertension    Non-ischemic cardiomyopathy ([place])    Presence of permanent cardiac pacemaker    PVD (peripheral vascular disease) ([place])    bilateral common iliac artery aneurysms. Right SFA occlusion over a long segment. Left SFA disease with occlusion of the left TP trunk. Artirogram Oct. 2006   Sleep apnea    Tobacco abuse    Urinary incontinence    detrussor instability    Past Surgical History:  Procedure Laterality Date    ABDOMINAL AORTIC ENDOVASCULAR STENT GRAFT N/A 05/07/2018   Procedure: ABDOMINAL AORTIC ENDOVASCULAR STENT GRAFT;  Surgeon: Conrad [place], MD;  Location: [facility];  Service: Vascular;  Laterality: N/A;   CARDIAC CATHETERIZATION  06/2004   negative   CARDIAC CATHETERIZATION  02/08/2020   CLIPPING OF ATRIAL APPENDAGE N/A 02/11/2020   Procedure: Clipping Of Atrial Appendage using AtriCure 35 MM AtriClip.;  Surgeon: Wonda Olds, MD;  [facility];  Service: Open Heart Surgery;  Laterality: N/A;   COLONOSCOPY  04/2005   COPD exacerbation     CORONARY ANGIOPLASTY  01/09/2018   CORONARY ARTERY BYPASS GRAFT N/A 02/11/2020   Procedure: CORONARY ARTERY BYPASS GRAFTING (CABG) using LIMA to LAD; RIMA to PL; Endoscopic right greater saphenous vein harvest: SVC to Diag; SVC to OM1.;  Surgeon: Wonda Olds, MD;  Location: [facility];  Service: Open Heart Surgery;  Laterality: N/A;  BILATERAL IMA   CORONARY STENT INTERVENTION N/A 01/10/2018   Procedure: CORONARY STENT INTERVENTION;  Surgeon: Lorretta Harp, MD;  Location: [facility];  Service: Cardiovascular;  Laterality: N/A;   CORONARY/GRAFT ACUTE MI REVASCULARIZATION N/A 01/10/2018   Procedure: Coronary/Graft Acute MI Revascularization;  Surgeon: Lorretta Harp, MD;  Location: [facility];  Service: Cardiovascular;  Laterality: N/A;   EMBOLIZATION Left 03/19/2018   EMBOLIZATION  Left 03/19/2018   Procedure: EMBOLIZATION - Left Internal;  Surgeon: Conrad [place], MD;  Location: [facility] [facility];  Service: Cardiovascular;  Laterality: Left;   EMBOLIZATION Right 04/16/2018   Procedure: EMBOLIZATION;  Surgeon: Conrad [place], MD;  Location: [facility];  Service: Cardiovascular;  Laterality: Right;   ENDOVEIN HARVEST OF GREATER SAPHENOUS VEIN Right 02/11/2020   Procedure: [place] Ropes Of Greater Saphenous Vein using right lower extremity.;  Surgeon: Wonda Olds, MD;  Location: [facility] OR;  Service: Open Heart Surgery;  Laterality:  Right;   FEMORAL ARTERY EXPLORATION Right 05/07/2018   Procedure: FEMORAL ARTERY EXPLORATION, EXTENDED PROFUNDAPLASTY;  Surgeon: Conrad [place], MD;  Location: [facility];  Service: Vascular;  Laterality: Right;   INTRAOPERATIVE ARTERIOGRAM Right 05/07/2018   Procedure: INTRA OPERATIVE ARTERIOGRAM;  Surgeon: Conrad [place], MD;  Location: [place];  Service: Vascular;  Laterality: Right;   LEFT HEART CATH AND CORONARY ANGIOGRAPHY N/A 01/10/2018   Procedure: LEFT HEART CATH AND CORONARY ANGIOGRAPHY;  Surgeon: Lorretta Harp, MD;  Location: [place] [facility];  Service: Cardiovascular;  Laterality: N/A;   LEFT HEART CATH AND CORONARY ANGIOGRAPHY N/A 02/08/2020   Procedure: LEFT HEART CATH AND CORONARY ANGIOGRAPHY;  Surgeon: Troy Sine, MD;  Location: [place] [facility];  Service: Cardiovascular;  Laterality: N/A;   PACEMAKER INSERTION  11/2005   PACEMAKER LEAD REMOVAL N/A 10/17/2014   Procedure: PACEMAKER LEAD REMOVAL;  Surgeon: Evans Lance, MD;  Location: [facility] [place];  Service: Cardiovascular;  Laterality: N/A;   RENAL ANGIOGRAPHY Left 04/16/2018   Procedure: RENAL ANGIOGRAPHY;  Surgeon: Conrad [place], MD;  Location: [place] [facility];  Service: Cardiovascular;  Laterality: Left;   s/p ICD placement      Medtronic Maximo [phone] single chamber   TEE WITHOUT CARDIOVERSION N/A 02/11/2020   Procedure: TRANSESOPHAGEAL ECHOCARDIOGRAM (TEE);  Surgeon: Wonda Olds, MD;  Location: [place] [facility];  Service: Open Heart Surgery;  Laterality: N/A;     Current Outpatient Medications  Medication Sig Dispense Refill   albuterol (VENTOLIN HFA) 108 (90 Base) MCG/ACT inhaler Inhale 2 puffs into the lungs every 6 (six) hours as needed for wheezing or shortness of breath. 18 g 0   aspirin 81 MG chewable tablet Chew 1 tablet (81 mg total) by mouth daily.     atorvastatin (LIPITOR) 80 MG tablet Take 1 tablet (80 mg total) by mouth daily at 6 PM. 90 tablet 3   Budeson-Glycopyrrol-Formoterol (BREZTRI AEROSPHERE) 160-9-4.8 MCG/ACT  AERO Inhale 2 puffs into the lungs in the morning and at bedtime. 5.9 g 0   clopidogrel (PLAVIX) 75 MG tablet TAKE 1 TABLET BY MOUTH EVERY DAY 90 tablet 3   fluticasone (FLONASE) 50 MCG/ACT nasal spray Place 1 spray into both nostrils daily. 16 g 2   furosemide (LASIX) 20 MG tablet Take 1 tablet (20 mg total) by mouth daily. 90 tablet 3   gabapentin (NEURONTIN) 100 MG capsule TAKE 1 CAPSULE BY MOUTH 3 TIMES DAILY 90 capsule 3   glipiZIDE (GLUCOTROL) 10 MG tablet Take 1 tablet (10 mg total) by mouth 2 (two) times daily before a meal. 180 tablet 3   isosorbide mononitrate (IMDUR) 30 MG 24 hr tablet TAKE 1 TABLET BY MOUTH EVERY DAY IN THE EVENING 90 tablet 3   metFORMIN (GLUCOPHAGE-XR) 500 MG 24 hr tablet TAKE 2 TABLETS BY MOUTH EVERY DAY WITH BREAKFAST 180 tablet 3   metoprolol tartrate (LOPRESSOR) 25 MG tablet TAKE 1 TABLET BY MOUTH  2 TIMES A DAY 180 tablet 3   Multiple Vitamin (MULTIVITAMIN WITH MINERALS) TABS tablet Take 1 tablet by mouth daily.     pantoprazole (PROTONIX) 40 MG tablet Take 1 tablet (40 mg total) by mouth 2 (two) times daily. 180 tablet 3   sacubitril-valsartan (ENTRESTO) 97-103 MG Take 1 tablet by mouth 2 (two) times daily. 60 tablet 11   vitamin B-12 (CYANOCOBALAMIN) 1000 MCG tablet Take 1,000 mcg by mouth daily.     nitroGLYCERIN (NITROSTAT) 0.4 MG SL tablet Place 1 tablet (0.4 mg total) under the tongue every 5 (five) minutes as needed for chest pain. 25 tablet 3   Current Facility-Administered Medications  Medication Dose Route Frequency Provider Last Rate Last Admin   sodium chloride flush (NS) 0.9 % injection 3 mL  3 mL Intravenous Q12H Minus Breeding, MD        Allergies:   Spironolactone   ROS:  Please see the history of present illness.   Otherwise, review of systems are positive for decreased memory , back and leg pain. .   All other systems are reviewed and negative.    PHYSICAL EXAM: VS:  BP 124/70   Pulse (!) 40   Ht '5\' 10"'$  (1.778 m)   Wt 290 lb 9.6 oz  (131.8 kg)   SpO2 96%   BMI 41.70 kg/m  , BMI Body mass index is 41.7 kg/m. GENERAL:  Well appearing NECK:  No jugular venous distention, waveform within normal limits, carotid upstroke brisk and symmetric, no bruits, no thyromegaly LUNGS:  Clear to auscultation bilaterally CHEST:  Well healed sternotomy scar. HEART:  PMI not displaced or sustained,S1 and S2 within normal limits, no S3, no S4, no clicks, no rubs, NO murmurs ABD:  Flat, positive bowel sounds normal in frequency in pitch, no bruits, no rebound, no guarding, no midline pulsatile mass, no hepatomegaly, no splenomegaly EXT:  2 plus pulses upper and diminished dorsalis pedis posttibial's bilateral, no edema, no cyanosis no clubbing    Cardiac cath 2021  Diagnostic Dominance: Right      EKG:  EKG is ordered today. The ekg ordered today demonstrates normal sinus rhythm, rate 61, premature ventricular contraction, axis within normal intervals with normal limits, no acute ST-T wave changes.  Recent Labs: 04/09/2022: ALT 22; BUN 14; Creatinine, Ser 0.96; Hemoglobin 13.5; Platelets 334.0; Potassium 4.3; Sodium 134    Lipid Panel    Component Value Date/Time   CHOL 113 05/09/2021 1030   CHOL 139 11/03/2020 0858   TRIG 308.0 (H) 05/09/2021 1030   TRIG 575 (HH) 01/02/2007 0000   HDL 36.30 (L) 05/09/2021 1030   HDL 37 (L) 11/03/2020 0858   CHOLHDL 3 05/09/2021 1030   VLDL 61.6 (H) 05/09/2021 1030   LDLCALC 51 11/03/2020 0858   LDLDIRECT 24.0 05/09/2021 1030      Wt Readings from Last 3 Encounters:  07/29/22 290 lb 9.6 oz (131.8 kg)  07/18/22 285 lb (129.3 kg)  07/05/22 284 lb 14.4 oz (129.2 kg)      Other studies Reviewed: Additional studies/ records that were reviewed today include: Pulmonary notes, pulmonary function testing, labs. Review of the above records demonstrates:  Please see elsewhere in the note.     ASSESSMENT AND PLAN:  S/P CABG x 4 He had CABG and following that in the same year 2021 had a  perfusion study which was difficult to assess but no perfusion defects.  We managed medically after that.  He has continued to have increasing shortness  of breath which may be multifactorial with weight, deconditioning and some lung disease but need to exclude ischemia and I do not think perfusion imaging will be optimal.  Therefore, he needs right and left heart catheterization.  Marland KitchenRSIK  Ischemic cardiomyopathy EF was mildly reduced before.  We will try to get him started on Farxiga.  We are going to help him do the paperwork to see if we can get that for him.  I would like to try to increase his Entresto to 97/103 twice daily.  I will be checking a BMP.  COPD (chronic obstructive pulmonary disease) with emphysema ([place]) This is being followed by Dr. Halford Chessman.  Essential hypertension This is being managed in the context of managing his cardiomyopathy.   Type 2 diabetes mellitus with diabetic neuropathy (HCC) A1c was 8.1.  This is being managed by Venia Carbon, MD   OSA (obstructive sleep apnea) He uses CPAP.  He had a recent sleep study.   PVD He is getting another lower extremity CT angiogram and is actively being followed by [facility].    Current medicines are reviewed at length with the patient today.  The patient does not have concerns regarding medicines.  The following changes have been made:  As above  Labs/ tests ordered today include:   Orders Placed This Encounter  Procedures   Basic Metabolic Panel (BMET)   CBC   Pro b natriuretic peptide (BNP)[facility])   EKG 12-Lead   EKG 12-Lead     Disposition:   FU with me after the cath.     Signed, Minus Breeding, MD  07/29/2022 10:06 AM    [facility]

## 2022-07-28 NOTE — H&P (View-Only) (Signed)
Cardiology Office Note   Date:  07/29/2022   ID:  Jameal, Razzano 06-08-1956, MRN [id number]  PCP:  Venia Carbon, MD  Cardiologist:   Minus Breeding, MD   Chief Complaint  Patient presents with   Shortness of Breath      History of Present Illness: ESTER MABE is a 66 y.o. male who presents for follow up of CAD.  He has anatomy as below.  He has had continued chest pain and wanted medical management.  He underwent CABG x4 (LIMA-LAD; RIMA-PL; SVG-Diag; SVG-OM1) in February 2021.  He is now have pulmonary function testing and follow-up with pulmonary.  He has had med changes but his biggest complaint is still shortness of breath.  He is not having any of the chest discomfort that he had previously and has not had taken nitroglycerin but has been very limited.  He has back problems.  He has leg problems being followed for peripheral vascular disease and is due for angiogram soon.  However, he is very limited in walking a short distance on level ground through his house.  He is not describing PND or orthopnea.  He is not describing new palpitations, presyncope or syncope.   Past Medical History:  Diagnosis Date   Arthritis    Automatic implantable cardioverter-defibrillator in situ    COPD (chronic obstructive pulmonary disease) (HCC)    emphysema by CXR   Coronary artery disease    Diabetes mellitus    type 2  no meds   Diverticulosis of colon    GERD (gastroesophageal reflux disease)    with esophagitis   History of colonic polyps    Hypertension    Non-ischemic cardiomyopathy ([place])    Presence of permanent cardiac pacemaker    PVD (peripheral vascular disease) ([place])    bilateral common iliac artery aneurysms. Right SFA occlusion over a long segment. Left SFA disease with occlusion of the left TP trunk. Artirogram Oct. 2006   Sleep apnea    Tobacco abuse    Urinary incontinence    detrussor instability    Past Surgical History:  Procedure Laterality Date    ABDOMINAL AORTIC ENDOVASCULAR STENT GRAFT N/A 05/07/2018   Procedure: ABDOMINAL AORTIC ENDOVASCULAR STENT GRAFT;  Surgeon: Conrad [place], MD;  Location: [facility];  Service: Vascular;  Laterality: N/A;   CARDIAC CATHETERIZATION  06/2004   negative   CARDIAC CATHETERIZATION  02/08/2020   CLIPPING OF ATRIAL APPENDAGE N/A 02/11/2020   Procedure: Clipping Of Atrial Appendage using AtriCure 35 MM AtriClip.;  Surgeon: Wonda Olds, MD;  [facility];  Service: Open Heart Surgery;  Laterality: N/A;   COLONOSCOPY  04/2005   COPD exacerbation     CORONARY ANGIOPLASTY  01/09/2018   CORONARY ARTERY BYPASS GRAFT N/A 02/11/2020   Procedure: CORONARY ARTERY BYPASS GRAFTING (CABG) using LIMA to LAD; RIMA to PL; Endoscopic right greater saphenous vein harvest: SVC to Diag; SVC to OM1.;  Surgeon: Wonda Olds, MD;  Location: [facility];  Service: Open Heart Surgery;  Laterality: N/A;  BILATERAL IMA   CORONARY STENT INTERVENTION N/A 01/10/2018   Procedure: CORONARY STENT INTERVENTION;  Surgeon: Lorretta Harp, MD;  Location: [place] [facility];  Service: Cardiovascular;  Laterality: N/A;   CORONARY/GRAFT ACUTE MI REVASCULARIZATION N/A 01/10/2018   Procedure: Coronary/Graft Acute MI Revascularization;  Surgeon: Lorretta Harp, MD;  Location: [place] [facility];  Service: Cardiovascular;  Laterality: N/A;   EMBOLIZATION Left 03/19/2018   EMBOLIZATION  Left 03/19/2018   Procedure: EMBOLIZATION - Left Internal;  Surgeon: Conrad [place], MD;  Location: [place] [facility];  Service: Cardiovascular;  Laterality: Left;   EMBOLIZATION Right 04/16/2018   Procedure: EMBOLIZATION;  Surgeon: Conrad [place], MD;  Location: [place] [facility];  Service: Cardiovascular;  Laterality: Right;   ENDOVEIN HARVEST OF GREATER SAPHENOUS VEIN Right 02/11/2020   Procedure: [place] Ropes Of Greater Saphenous Vein using right lower extremity.;  Surgeon: Wonda Olds, MD;  Location: [facility];  Service: Open Heart Surgery;  Laterality:  Right;   FEMORAL ARTERY EXPLORATION Right 05/07/2018   Procedure: FEMORAL ARTERY EXPLORATION, EXTENDED PROFUNDAPLASTY;  Surgeon: Conrad [place], MD;  Location: [place];  Service: Vascular;  Laterality: Right;   INTRAOPERATIVE ARTERIOGRAM Right 05/07/2018   Procedure: INTRA OPERATIVE ARTERIOGRAM;  Surgeon: Conrad [place], MD;  Location: [place];  Service: Vascular;  Laterality: Right;   LEFT HEART CATH AND CORONARY ANGIOGRAPHY N/A 01/10/2018   Procedure: LEFT HEART CATH AND CORONARY ANGIOGRAPHY;  Surgeon: Lorretta Harp, MD;  Location: [facility] [facility];  Service: Cardiovascular;  Laterality: N/A;   LEFT HEART CATH AND CORONARY ANGIOGRAPHY N/A 02/08/2020   Procedure: LEFT HEART CATH AND CORONARY ANGIOGRAPHY;  Surgeon: Troy Sine, MD;  Location: [place] [facility];  Service: Cardiovascular;  Laterality: N/A;   PACEMAKER INSERTION  11/2005   PACEMAKER LEAD REMOVAL N/A 10/17/2014   Procedure: PACEMAKER LEAD REMOVAL;  Surgeon: Evans Lance, MD;  Location: [place];  Service: Cardiovascular;  Laterality: N/A;   RENAL ANGIOGRAPHY Left 04/16/2018   Procedure: RENAL ANGIOGRAPHY;  Surgeon: Conrad [place], MD;  Location: [place] [facility];  Service: Cardiovascular;  Laterality: Left;   s/p ICD placement      Medtronic Maximo [phone] single chamber   TEE WITHOUT CARDIOVERSION N/A 02/11/2020   Procedure: TRANSESOPHAGEAL ECHOCARDIOGRAM (TEE);  Surgeon: Wonda Olds, MD;  Location: [place];  Service: Open Heart Surgery;  Laterality: N/A;     Current Outpatient Medications  Medication Sig Dispense Refill   albuterol (VENTOLIN HFA) 108 (90 Base) MCG/ACT inhaler Inhale 2 puffs into the lungs every 6 (six) hours as needed for wheezing or shortness of breath. 18 g 0   aspirin 81 MG chewable tablet Chew 1 tablet (81 mg total) by mouth daily.     atorvastatin (LIPITOR) 80 MG tablet Take 1 tablet (80 mg total) by mouth daily at 6 PM. 90 tablet 3   Budeson-Glycopyrrol-Formoterol (BREZTRI AEROSPHERE) 160-9-4.8 MCG/ACT  AERO Inhale 2 puffs into the lungs in the morning and at bedtime. 5.9 g 0   clopidogrel (PLAVIX) 75 MG tablet TAKE 1 TABLET BY MOUTH EVERY DAY 90 tablet 3   fluticasone (FLONASE) 50 MCG/ACT nasal spray Place 1 spray into both nostrils daily. 16 g 2   furosemide (LASIX) 20 MG tablet Take 1 tablet (20 mg total) by mouth daily. 90 tablet 3   gabapentin (NEURONTIN) 100 MG capsule TAKE 1 CAPSULE BY MOUTH 3 TIMES DAILY 90 capsule 3   glipiZIDE (GLUCOTROL) 10 MG tablet Take 1 tablet (10 mg total) by mouth 2 (two) times daily before a meal. 180 tablet 3   isosorbide mononitrate (IMDUR) 30 MG 24 hr tablet TAKE 1 TABLET BY MOUTH EVERY DAY IN THE EVENING 90 tablet 3   metFORMIN (GLUCOPHAGE-XR) 500 MG 24 hr tablet TAKE 2 TABLETS BY MOUTH EVERY DAY WITH BREAKFAST 180 tablet 3   metoprolol tartrate (LOPRESSOR) 25 MG tablet TAKE 1 TABLET BY MOUTH  2 TIMES A DAY 180 tablet 3   Multiple Vitamin (MULTIVITAMIN WITH MINERALS) TABS tablet Take 1 tablet by mouth daily.     pantoprazole (PROTONIX) 40 MG tablet Take 1 tablet (40 mg total) by mouth 2 (two) times daily. 180 tablet 3   sacubitril-valsartan (ENTRESTO) 97-103 MG Take 1 tablet by mouth 2 (two) times daily. 60 tablet 11   vitamin B-12 (CYANOCOBALAMIN) 1000 MCG tablet Take 1,000 mcg by mouth daily.     nitroGLYCERIN (NITROSTAT) 0.4 MG SL tablet Place 1 tablet (0.4 mg total) under the tongue every 5 (five) minutes as needed for chest pain. 25 tablet 3   Current Facility-Administered Medications  Medication Dose Route Frequency Provider Last Rate Last Admin   sodium chloride flush (NS) 0.9 % injection 3 mL  3 mL Intravenous Q12H Minus Breeding, MD        Allergies:   Spironolactone   ROS:  Please see the history of present illness.   Otherwise, review of systems are positive for decreased memory , back and leg pain. .   All other systems are reviewed and negative.    PHYSICAL EXAM: VS:  BP 124/70   Pulse (!) 40   Ht '5\' 10"'$  (1.778 m)   Wt 290 lb 9.6 oz  (131.8 kg)   SpO2 96%   BMI 41.70 kg/m  , BMI Body mass index is 41.7 kg/m. GENERAL:  Well appearing NECK:  No jugular venous distention, waveform within normal limits, carotid upstroke brisk and symmetric, no bruits, no thyromegaly LUNGS:  Clear to auscultation bilaterally CHEST:  Well healed sternotomy scar. HEART:  PMI not displaced or sustained,S1 and S2 within normal limits, no S3, no S4, no clicks, no rubs, NO murmurs ABD:  Flat, positive bowel sounds normal in frequency in pitch, no bruits, no rebound, no guarding, no midline pulsatile mass, no hepatomegaly, no splenomegaly EXT:  2 plus pulses upper and diminished dorsalis pedis posttibial's bilateral, no edema, no cyanosis no clubbing    Cardiac cath 2021  Diagnostic Dominance: Right      EKG:  EKG is ordered today. The ekg ordered today demonstrates normal sinus rhythm, rate 61, premature ventricular contraction, axis within normal intervals with normal limits, no acute ST-T wave changes.  Recent Labs: 04/09/2022: ALT 22; BUN 14; Creatinine, Ser 0.96; Hemoglobin 13.5; Platelets 334.0; Potassium 4.3; Sodium 134    Lipid Panel    Component Value Date/Time   CHOL 113 05/09/2021 1030   CHOL 139 11/03/2020 0858   TRIG 308.0 (H) 05/09/2021 1030   TRIG 575 (HH) 01/02/2007 0000   HDL 36.30 (L) 05/09/2021 1030   HDL 37 (L) 11/03/2020 0858   CHOLHDL 3 05/09/2021 1030   VLDL 61.6 (H) 05/09/2021 1030   LDLCALC 51 11/03/2020 0858   LDLDIRECT 24.0 05/09/2021 1030      Wt Readings from Last 3 Encounters:  07/29/22 290 lb 9.6 oz (131.8 kg)  07/18/22 285 lb (129.3 kg)  07/05/22 284 lb 14.4 oz (129.2 kg)      Other studies Reviewed: Additional studies/ records that were reviewed today include: Pulmonary notes, pulmonary function testing, labs. Review of the above records demonstrates:  Please see elsewhere in the note.     ASSESSMENT AND PLAN:  S/P CABG x 4 He had CABG and following that in the same year 2021 had a  perfusion study which was difficult to assess but no perfusion defects.  We managed medically after that.  He has continued to have increasing shortness  of breath which may be multifactorial with weight, deconditioning and some lung disease but need to exclude ischemia and I do not think perfusion imaging will be optimal.  Therefore, he needs right and left heart catheterization.  Marland KitchenRSIK  Ischemic cardiomyopathy EF was mildly reduced before.  We will try to get him started on Farxiga.  We are going to help him do the paperwork to see if we can get that for him.  I would like to try to increase his Entresto to 97/103 twice daily.  I will be checking a BMP.  COPD (chronic obstructive pulmonary disease) with emphysema ([place]) This is being followed by Dr. Halford Chessman.  Essential hypertension This is being managed in the context of managing his cardiomyopathy.   Type 2 diabetes mellitus with diabetic neuropathy (HCC) A1c was 8.1.  This is being managed by Venia Carbon, MD   OSA (obstructive sleep apnea) He uses CPAP.  He had a recent sleep study.   PVD He is getting another lower extremity CT angiogram and is actively being followed by [facility].    Current medicines are reviewed at length with the patient today.  The patient does not have concerns regarding medicines.  The following changes have been made:  As above  Labs/ tests ordered today include:   Orders Placed This Encounter  Procedures   Basic Metabolic Panel (BMET)   CBC   Pro b natriuretic peptide (BNP)[facility])   EKG 12-Lead   EKG 12-Lead     Disposition:   FU with me after the cath.     Signed, Minus Breeding, MD  07/29/2022 10:06 AM    [facility]

## 2022-07-29 ENCOUNTER — Encounter: Payer: Self-pay | Admitting: Cardiology

## 2022-07-29 ENCOUNTER — Ambulatory Visit: Payer: Medicare Other | Admitting: Cardiology

## 2022-07-29 VITALS — BP 124/70 | HR 40 | Ht 70.0 in | Wt 290.6 lb

## 2022-07-29 DIAGNOSIS — R0602 Shortness of breath: Secondary | ICD-10-CM | POA: Diagnosis not present

## 2022-07-29 DIAGNOSIS — E118 Type 2 diabetes mellitus with unspecified complications: Secondary | ICD-10-CM | POA: Diagnosis not present

## 2022-07-29 DIAGNOSIS — I2510 Atherosclerotic heart disease of native coronary artery without angina pectoris: Secondary | ICD-10-CM | POA: Diagnosis not present

## 2022-07-29 DIAGNOSIS — I255 Ischemic cardiomyopathy: Secondary | ICD-10-CM

## 2022-07-29 DIAGNOSIS — I10 Essential (primary) hypertension: Secondary | ICD-10-CM

## 2022-07-29 MED ORDER — SACUBITRIL-VALSARTAN 97-103 MG PO TABS: 1.0000 | ORAL_TABLET | Freq: Two times a day (BID) | ORAL | 11 refills | Status: DC

## 2022-07-29 MED ORDER — SODIUM CHLORIDE 0.9% FLUSH: 3.0000 mL | Freq: Two times a day (BID) | INTRAVENOUS | Status: DC

## 2022-07-29 NOTE — Patient Instructions (Addendum)
  INCREASE ENTRESTO TO 97/103 MG TWICE DAILY= 2 OF THE 49/51 MG TABLETS TWICE DAILY     [facility] CARDIOVASCULAR DIVISION [facility] [place] [address] Dept: [phone] Loc: [place]  07/29/2022  You are scheduled for a Cardiac Catheterization on Wednesday, August 9 with Dr. Lauree Chandler.  1. Please arrive at the Main Entrance A at [facility]: [place], [place] [address] at 8:00 AM (This time is two hours before your procedure to ensure your preparation). Free valet parking service is available.   Special note: Every effort is made to have your procedure done on time. Please understand that emergencies sometimes delay scheduled procedures.  2. Diet: Do not eat solid foods after midnight.  You may have clear liquids until 5 AM upon the day of the procedure.  4. Medication instructions in preparation for your procedure:  DO NOT TAKE FUROSEMIDE THE MORNING OF THE PROCEDURE  DO NOT TAKE GLIPIZIDE THE MORNING OF THE PROCEDURE  DO NOT TAKE METFORMIN WEDNESDAY, THURSDAY OR FRIDAY RESTART SATURDAY 08/03/22    On the morning of your procedure, take Plavix/Clopidogrel and any morning medicines NOT listed above.  You may use sips of water.  5. Plan to go home the same day, you will only stay overnight if medically necessary. 6. You MUST have a responsible adult to drive you home. 7. An adult MUST be with you the first 24 hours after you arrive home. 8. Bring a current list of your medications, and the last time and date medication taken. 9. Bring ID and current insurance cards. 10.Please wear clothes that are easy to get on and off and wear slip-on shoes.  Thank you for allowing [place] to care for you!   -- [facility]  Your physician recommends that you schedule a follow-up appointment in: 2 WEEKS

## 2022-07-30 ENCOUNTER — Ambulatory Visit
Admission: RE | Admit: 2022-07-30 | Discharge: 2022-07-30 | Disposition: A | Discharge status: Home / Self Care | Payer: Medicare Other | Source: Ambulatory Visit | Attending: Vascular Surgery | Admitting: Vascular Surgery

## 2022-07-30 ENCOUNTER — Telehealth: Payer: Self-pay | Admitting: Pulmonary Disease

## 2022-07-30 DIAGNOSIS — K449 Diaphragmatic hernia without obstruction or gangrene: Secondary | ICD-10-CM | POA: Diagnosis not present

## 2022-07-30 DIAGNOSIS — K8020 Calculus of gallbladder without cholecystitis without obstruction: Secondary | ICD-10-CM | POA: Diagnosis not present

## 2022-07-30 DIAGNOSIS — I723 Aneurysm of iliac artery: Secondary | ICD-10-CM

## 2022-07-30 DIAGNOSIS — G4733 Obstructive sleep apnea (adult) (pediatric): Secondary | ICD-10-CM

## 2022-07-30 DIAGNOSIS — I714 Abdominal aortic aneurysm, without rupture, unspecified: Secondary | ICD-10-CM | POA: Diagnosis not present

## 2022-07-30 LAB — BASIC METABOLIC PANEL
BUN/Creatinine Ratio: 13 (ref 10–24)
BUN: 11 mg/dL (ref 8–27)
CO2: 25 mmol/L (ref 20–29)
Calcium: 9 mg/dL (ref 8.6–10.2)
Chloride: 100 mmol/L (ref 96–106)
Creatinine, Ser: 0.87 mg/dL (ref 0.76–1.27)
Glucose: 193 mg/dL — ABNORMAL HIGH (ref 70–99)
Potassium: 4.4 mmol/L (ref 3.5–5.2)
Sodium: 136 mmol/L (ref 134–144)
eGFR: 96 mL/min/{1.73_m2} (ref >59)

## 2022-07-30 LAB — CBC
Hematocrit: 39.7 % (ref 37.5–51.0)
Hemoglobin: 12.5 g/dL — ABNORMAL LOW (ref 13.0–17.7)
MCH: 21.9 pg — ABNORMAL LOW (ref 26.6–33.0)
MCHC: 31.5 g/dL (ref 31.5–35.7)
MCV: 69 fL — ABNORMAL LOW (ref 79–97)
Platelets: 293 10*3/uL (ref 150–450)
RBC: 5.72 x10E6/uL (ref 4.14–5.80)
RDW: 21.3 % — ABNORMAL HIGH (ref 11.6–15.4)
WBC: 6.9 10*3/uL (ref 3.4–10.8)

## 2022-07-30 LAB — PRO B NATRIURETIC PEPTIDE: NT-Pro BNP: 227 pg/mL (ref 0–376)

## 2022-07-30 MED ORDER — IOPAMIDOL (ISOVUE-370) INJECTION 76%
75.0000 mL | Freq: Once | INTRAVENOUS | Status: AC | PRN
  Administered 2022-07-30: 75 mL via INTRAVENOUS

## 2022-07-30 NOTE — Procedures (Signed)
    Patient Name: [name], [name] Date: 07/18/2022 Gender: Male D.O.B: 01-04-1956 Age (years): 57 Referring Provider: Clayton Bibles NP Height (inches): 37 Interpreting Physician: Chesley Mires MD, ABSM Weight (lbs): 285 RPSGT: Baxter Flattery BMI: 41 MRN: [id number] Neck Size: 21.00  CLINICAL INFORMATION The patient is referred for a BiPAP titration to treat sleep apnea.  Date of NPSG 04/02/20: AHI 59, SpO2 low 85%.  SLEEP STUDY TECHNIQUE As per the AASM Manual for the Scoring of Sleep and Associated Events v2.3 (April 2016) with a hypopnea requiring 4% desaturations.  The channels recorded and monitored were frontal, central and occipital EEG, electrooculogram (EOG), submentalis EMG (chin), nasal and oral airflow, thoracic and abdominal wall motion, anterior tibialis EMG, snore microphone, electrocardiogram, and pulse oximetry. Bilevel positive airway pressure (BPAP) was initiated at the beginning of the study and titrated to treat sleep-disordered breathing.  MEDICATIONS Medications self-administered by patient taken the night of the study : N/A  RESPIRATORY PARAMETERS Optimal IPAP Pressure (cm): 14 AHI at Optimal Pressure (/hr) 28.9 Optimal EPAP Pressure (cm): 10   Overall Minimal O2 (%): 85.0 Minimal O2 at Optimal Pressure (%): 93.0  He developed central apneas while wearing CPAP.  These improved partially after transitioning to Bipap, but not completely resolved.  He had limited test time with Bipap during this study.  SLEEP ARCHITECTURE Start Time: 10:05:39 PM Stop Time: 5:03:36 AM Total Time (min): 417.9 Total Sleep Time (min): 311 Sleep Latency (min): 4.6 Sleep Efficiency (%): 74.4% REM Latency (min): 118.0 WASO (min): 102.3 Stage N1 (%): 1.3% Stage N2 (%): 80.5% Stage N3 (%): 0.0% Stage R (%): 18.2 Supine (%): 0.00 Arousal Index (/hr): 11.0   CARDIAC DATA The 2 lead EKG demonstrated sinus rhythm. The mean heart rate was 62.6 beats per minute. Other EKG findings  include: Atrial Fibrillation, PVCs.  LEG MOVEMENT DATA The total Periodic Limb Movements of Sleep (PLMS) were 0. The PLMS index was 0.0. A PLMS index of <15 is considered normal in adults.  IMPRESSIONS - He developed central apneas while using CPAP. - He had sub-optimal titration with Bipap due to limited test time with Bipap. - 2-lead EKG demonstrated: PVCs  DIAGNOSIS - Obstructive Sleep Apnea  - Treatment Emergent Central Apnea  RECOMMENDATIONS - Trial of BiPAP therapy on 15/11 cm H2O with a Large size Fisher&Paykel Full Face Simplus mask and heated humidification. - Avoid alcohol, sedatives and other CNS depressants that may worsen sleep apnea and disrupt normal sleep architecture. - Sleep hygiene should be reviewed to assess factors that may improve sleep quality. - Weight management and regular exercise should be initiated or continued.  [Electronically signed] 07/30/2022 04:16 PM  Chesley Mires MD, ABSM Diplomate, American Board of Sleep Medicine NPI: [id number]  [place] PH: [phone]   FX: [phone] [place]

## 2022-07-30 NOTE — Telephone Encounter (Signed)
Bipap 07/18/22 >> central apneas with CPAP >> sub-optimal Bipap; used up to 14/10 cm H2O.   Please let him know that he still had breathing issues with CPAP.  He needs to be set up with Bipap.  Please send an order to get him set up with Resmed Bipap at 15/11 cm H2O with heated humidity.  He uses [employer] for his DME.  Please schedule follow up in 4 months.

## 2022-07-31 ENCOUNTER — Other Ambulatory Visit: Payer: Self-pay

## 2022-07-31 ENCOUNTER — Ambulatory Visit (HOSPITAL_COMMUNITY)
Admission: RE | Admit: 2022-07-31 | Discharge: 2022-07-31 | Disposition: A | Discharge status: Home / Self Care | Payer: Medicare Other | Attending: Cardiovascular Disease | Admitting: Cardiovascular Disease

## 2022-07-31 ENCOUNTER — Encounter (HOSPITAL_COMMUNITY): Payer: Self-pay | Admitting: Cardiovascular Disease

## 2022-07-31 ENCOUNTER — Encounter (HOSPITAL_COMMUNITY)
Admission: RE | Disposition: A | Discharge status: Home / Self Care | Payer: Self-pay | Source: Home / Self Care | Attending: Cardiovascular Disease

## 2022-07-31 DIAGNOSIS — E1151 Type 2 diabetes mellitus with diabetic peripheral angiopathy without gangrene: Secondary | ICD-10-CM | POA: Diagnosis not present

## 2022-07-31 DIAGNOSIS — I25118 Atherosclerotic heart disease of native coronary artery with other forms of angina pectoris: Secondary | ICD-10-CM

## 2022-07-31 DIAGNOSIS — Z955 Presence of coronary angioplasty implant and graft: Secondary | ICD-10-CM | POA: Insufficient documentation

## 2022-07-31 DIAGNOSIS — I10 Essential (primary) hypertension: Secondary | ICD-10-CM | POA: Insufficient documentation

## 2022-07-31 DIAGNOSIS — I2582 Chronic total occlusion of coronary artery: Secondary | ICD-10-CM | POA: Insufficient documentation

## 2022-07-31 DIAGNOSIS — R0602 Shortness of breath: Secondary | ICD-10-CM | POA: Insufficient documentation

## 2022-07-31 DIAGNOSIS — I255 Ischemic cardiomyopathy: Secondary | ICD-10-CM | POA: Diagnosis not present

## 2022-07-31 DIAGNOSIS — G4733 Obstructive sleep apnea (adult) (pediatric): Secondary | ICD-10-CM | POA: Diagnosis not present

## 2022-07-31 DIAGNOSIS — J449 Chronic obstructive pulmonary disease, unspecified: Secondary | ICD-10-CM | POA: Diagnosis not present

## 2022-07-31 DIAGNOSIS — I25708 Atherosclerosis of coronary artery bypass graft(s), unspecified, with other forms of angina pectoris: Secondary | ICD-10-CM | POA: Diagnosis not present

## 2022-07-31 DIAGNOSIS — Z7984 Long term (current) use of oral hypoglycemic drugs: Secondary | ICD-10-CM | POA: Insufficient documentation

## 2022-07-31 DIAGNOSIS — E118 Type 2 diabetes mellitus with unspecified complications: Secondary | ICD-10-CM

## 2022-07-31 DIAGNOSIS — I2510 Atherosclerotic heart disease of native coronary artery without angina pectoris: Secondary | ICD-10-CM

## 2022-07-31 DIAGNOSIS — E1140 Type 2 diabetes mellitus with diabetic neuropathy, unspecified: Secondary | ICD-10-CM | POA: Insufficient documentation

## 2022-07-31 HISTORY — PX: RIGHT/LEFT HEART CATH AND CORONARY/GRAFT ANGIOGRAPHY: CATH118267

## 2022-07-31 LAB — POCT I-STAT EG7
Acid-base deficit: 2 mmol/L (ref 0.0–2.0)
Bicarbonate: 23 mmol/L (ref 20.0–28.0)
Calcium, Ion: 1.13 mmol/L — ABNORMAL LOW (ref 1.15–1.40)
HCT: 36 % — ABNORMAL LOW (ref 39.0–52.0)
Hemoglobin: 12.2 g/dL — ABNORMAL LOW (ref 13.0–17.0)
O2 Saturation: 69 %
Potassium: 3.8 mmol/L (ref 3.5–5.1)
Sodium: 141 mmol/L (ref 135–145)
TCO2: 24 mmol/L (ref 22–32)
pCO2, Ven: 39 mmHg — ABNORMAL LOW (ref 44–60)
pH, Ven: 7.378 (ref 7.25–7.43)
pO2, Ven: 37 mmHg (ref 32–45)

## 2022-07-31 LAB — POCT I-STAT 7, (LYTES, BLD GAS, ICA,H+H)
Acid-base deficit: 3 mmol/L — ABNORMAL HIGH (ref 0.0–2.0)
Bicarbonate: 21.5 mmol/L (ref 20.0–28.0)
Calcium, Ion: 1.04 mmol/L — ABNORMAL LOW (ref 1.15–1.40)
HCT: 34 % — ABNORMAL LOW (ref 39.0–52.0)
Hemoglobin: 11.6 g/dL — ABNORMAL LOW (ref 13.0–17.0)
O2 Saturation: 95 %
Potassium: 3.7 mmol/L (ref 3.5–5.1)
Sodium: 143 mmol/L (ref 135–145)
TCO2: 23 mmol/L (ref 22–32)
pCO2 arterial: 34.9 mmHg (ref 32–48)
pH, Arterial: 7.398 (ref 7.35–7.45)
pO2, Arterial: 75 mmHg — ABNORMAL LOW (ref 83–108)

## 2022-07-31 LAB — GLUCOSE, CAPILLARY: Glucose-Capillary: 279 mg/dL — ABNORMAL HIGH (ref 70–99)

## 2022-07-31 SURGERY — RIGHT/LEFT HEART CATH AND CORONARY/GRAFT ANGIOGRAPHY
Anesthesia: LOCAL

## 2022-07-31 MED ORDER — LABETALOL HCL 5 MG/ML IV SOLN: 10.0000 mg | INTRAVENOUS | Status: DC | PRN

## 2022-07-31 MED ORDER — ASPIRIN 81 MG PO CHEW: 81.0000 mg | CHEWABLE_TABLET | ORAL | Status: DC

## 2022-07-31 MED ORDER — ACETAMINOPHEN 325 MG PO TABS: 650.0000 mg | ORAL_TABLET | ORAL | Status: DC | PRN

## 2022-07-31 MED ORDER — VERAPAMIL HCL 2.5 MG/ML IV SOLN
INTRAVENOUS | Status: AC
  Filled 2022-07-31: qty 2

## 2022-07-31 MED ORDER — HEPARIN SODIUM (PORCINE) 1000 UNIT/ML IJ SOLN
INTRAMUSCULAR | Status: AC
  Filled 2022-07-31: qty 10

## 2022-07-31 MED ORDER — SODIUM CHLORIDE 0.9 % IV SOLN: 250.0000 mL | INTRAVENOUS | Status: DC | PRN

## 2022-07-31 MED ORDER — VERAPAMIL HCL 2.5 MG/ML IV SOLN
INTRAVENOUS | Status: DC | PRN
  Administered 2022-07-31: 10 mL via INTRA_ARTERIAL

## 2022-07-31 MED ORDER — MIDAZOLAM HCL 2 MG/2ML IJ SOLN
INTRAMUSCULAR | Status: AC
  Filled 2022-07-31: qty 2

## 2022-07-31 MED ORDER — SODIUM CHLORIDE 0.9% FLUSH: 3.0000 mL | INTRAVENOUS | Status: DC | PRN

## 2022-07-31 MED ORDER — HEPARIN (PORCINE) IN NACL 1000-0.9 UT/500ML-% IV SOLN
INTRAVENOUS | Status: DC | PRN
  Administered 2022-07-31 (×3): 500 mL

## 2022-07-31 MED ORDER — HEPARIN (PORCINE) IN NACL 1000-0.9 UT/500ML-% IV SOLN
INTRAVENOUS | Status: AC
  Filled 2022-07-31: qty 1000

## 2022-07-31 MED ORDER — LIDOCAINE HCL (PF) 1 % IJ SOLN
INTRAMUSCULAR | Status: DC | PRN
  Administered 2022-07-31 (×2): 2 mL

## 2022-07-31 MED ORDER — ONDANSETRON HCL 4 MG/2ML IJ SOLN: 4.0000 mg | Freq: Four times a day (QID) | INTRAMUSCULAR | Status: DC | PRN

## 2022-07-31 MED ORDER — SODIUM CHLORIDE 0.9 % WEIGHT BASED INFUSION
3.0000 mL/kg/h | INTRAVENOUS | Status: AC
  Administered 2022-07-31: 3 mL/kg/h via INTRAVENOUS

## 2022-07-31 MED ORDER — HEPARIN SODIUM (PORCINE) 1000 UNIT/ML IJ SOLN
INTRAMUSCULAR | Status: DC | PRN
  Administered 2022-07-31: 6000 [IU] via INTRAVENOUS

## 2022-07-31 MED ORDER — SODIUM CHLORIDE 0.9 % WEIGHT BASED INFUSION: 1.0000 mL/kg/h | INTRAVENOUS | Status: DC

## 2022-07-31 MED ORDER — FENTANYL CITRATE (PF) 100 MCG/2ML IJ SOLN
INTRAMUSCULAR | Status: DC | PRN
  Administered 2022-07-31 (×2): 25 ug via INTRAVENOUS

## 2022-07-31 MED ORDER — SODIUM CHLORIDE 0.9% FLUSH: 3.0000 mL | Freq: Two times a day (BID) | INTRAVENOUS | Status: DC

## 2022-07-31 MED ORDER — MIDAZOLAM HCL 2 MG/2ML IJ SOLN
INTRAMUSCULAR | Status: DC | PRN
  Administered 2022-07-31 (×2): 1 mg via INTRAVENOUS

## 2022-07-31 MED ORDER — IOHEXOL 350 MG/ML SOLN
INTRAVENOUS | Status: DC | PRN
  Administered 2022-07-31: 150 mL

## 2022-07-31 MED ORDER — FENTANYL CITRATE (PF) 100 MCG/2ML IJ SOLN
INTRAMUSCULAR | Status: AC
  Filled 2022-07-31: qty 2

## 2022-07-31 MED ORDER — ASPIRIN 81 MG PO CHEW
81.0000 mg | CHEWABLE_TABLET | ORAL | Status: AC
  Administered 2022-07-31: 81 mg via ORAL

## 2022-07-31 MED ORDER — LIDOCAINE HCL (PF) 1 % IJ SOLN
INTRAMUSCULAR | Status: AC
  Filled 2022-07-31: qty 30

## 2022-07-31 MED ORDER — HYDRALAZINE HCL 20 MG/ML IJ SOLN: 10.0000 mg | INTRAMUSCULAR | Status: DC | PRN

## 2022-07-31 MED ORDER — SODIUM CHLORIDE 0.9 % IV SOLN
INTRAVENOUS | Status: AC
Start: 2022-07-31 — End: 2022-07-31

## 2022-07-31 SURGICAL SUPPLY — 16 items
BAND ZEPHYR COMPRESS 30 LONG (HEMOSTASIS) ×1 IMPLANT
CATH 5FR JL3.5 JR4 ANG PIG MP (CATHETERS) ×1 IMPLANT
CATH BALLN WEDGE 5F 110CM (CATHETERS) ×1 IMPLANT
CATH INFINITI 5 FR AR1 MOD (CATHETERS) ×1 IMPLANT
CATH INFINITI 5 FR IM (CATHETERS) ×1 IMPLANT
CATH INFINITI 5 FR LCB (CATHETERS) ×1 IMPLANT
CATH INFINITI 5 FR RCB (CATHETERS) ×1 IMPLANT
CATH INFINITI 5FR JL4 (CATHETERS) ×1 IMPLANT
GLIDESHEATH SLEND SS 6F .021 (SHEATH) ×1 IMPLANT
GUIDEWIRE INQWIRE 1.5J.035X260 (WIRE) IMPLANT
INQWIRE 1.5J .035X260CM (WIRE) ×2
KIT HEART LEFT (KITS) ×2 IMPLANT
PACK CARDIAC CATHETERIZATION (CUSTOM PROCEDURE TRAY) ×2 IMPLANT
SHEATH GLIDE SLENDER 4/5FR (SHEATH) ×1 IMPLANT
TRANSDUCER W/STOPCOCK (MISCELLANEOUS) ×2 IMPLANT
TUBING CIL FLEX 10 FLL-RA (TUBING) ×2 IMPLANT

## 2022-07-31 NOTE — Interval H&P Note (Signed)
History and Physical Interval Note:  07/31/2022 8:18 AM  [name]  has presented today for surgery, with the diagnosis of shortness of breath ,cad.  The various methods of treatment have been discussed with the patient and family. After consideration of risks, benefits and other options for treatment, the patient has consented to  Procedure(s): RIGHT/LEFT HEART CATH AND CORONARY/GRAFT ANGIOGRAPHY (N/A) as a surgical intervention.  The patient's history has been reviewed, patient examined, no change in status, stable for surgery.  I have reviewed the patient's chart and labs.  Questions were answered to the patient's satisfaction.    Cath Lab Visit (complete for each Cath Lab visit)  Clinical Evaluation Leading to the Procedure:   ACS: No.  Non-ACS:    Anginal Classification: CCS II  Anti-ischemic medical therapy: Maximal Therapy (2 or more classes of medications)  Non-Invasive Test Results: No non-invasive testing performed  Prior CABG: Previous CABG        Lauree Chandler

## 2022-07-31 NOTE — Telephone Encounter (Signed)
Patients wife called and went over results with her. She voiced understanding of Dr. Collie Siad recommendations. Order placed. Recall for 4 month follow up placed since schedule is not out yet, Nothing further needed.

## 2022-07-31 NOTE — Telephone Encounter (Signed)
ATC patient.  LMTCB. 

## 2022-07-31 NOTE — Discharge Instructions (Signed)
Hold metformin 48 hours post cath

## 2022-08-07 NOTE — Progress Notes (Signed)
Office Note     CC: Peripheral vascular disease Requesting Provider:  Venia Carbon, MD  HPI: [name] is a 66 y.o. (May 25, 1956) male presenting in follow-up status post CT angio abdomen pelvis to evaluate previous EVAR with bilateral hypogastric artery coiling which appeared to have grown in size based on recent abdominal aortic ultrasound.  In the interim since his last visit, [name] underwent cardiac cath demonstrating severe triple-vessel coronary artery disease status post four-vessel CABG, 3/4 grafts appear to be patent.  Dyspnea was believed to be multifactorial given COPD, sleep apnea, deconditioning, morbid obesity  [name] underwent 2019 EVAR with bilateral hypo gastric artery coil embolization through a left-sided common femoral artery cutdown.  The case was complicated by right lower extremity acute limb ischemia requiring femoral endarterectomy profundoplasty with bovine pericardial patch. All of the above was performed by Dr. Bridgett Larsson.  The aneurysms were initially found during an intervention for claudication.  Today, [name] felt at his baseline.  He cites back pain, bilateral lower extremity pain, shortness of breath as reasons for decreased activity level and weight gain.  He is able to make 1 trip from the house to the car and then back into his home before needing to sit down due to claudication, shortness of breath, back pain.  A grave digger by trade, he is now mostly sedentary, doing independently with his wife.  Bilateral lower extremity claudication symptoms have been unchanged since 2019.    The pt is  on a statin for cholesterol management.  The pt is  on a daily aspirin.   Other AC:  plavix The pt is  on medication for hypertension.   The pt is  diabetic.  Tobacco hx:  former  Past Medical History:  Diagnosis Date   Arthritis    Automatic implantable cardioverter-defibrillator in situ    COPD (chronic obstructive pulmonary disease) (HCC)    emphysema by CXR    Coronary artery disease    Diabetes mellitus    type 2  no meds   Diverticulosis of colon    GERD (gastroesophageal reflux disease)    with esophagitis   History of colonic polyps    Hypertension    Non-ischemic cardiomyopathy (HCC)    Presence of permanent cardiac pacemaker    PVD (peripheral vascular disease) ([place])    bilateral common iliac artery aneurysms. Right SFA occlusion over a long segment. Left SFA disease with occlusion of the left TP trunk. Artirogram Oct. 2006   Sleep apnea    Tobacco abuse    Urinary incontinence    detrussor instability    Past Surgical History:  Procedure Laterality Date   ABDOMINAL AORTIC ENDOVASCULAR STENT GRAFT N/A 05/07/2018   Procedure: ABDOMINAL AORTIC ENDOVASCULAR STENT GRAFT;  Surgeon: Conrad Casey, MD;  Location: [place] [facility];  Service: Vascular;  Laterality: N/A;   CARDIAC CATHETERIZATION  06/2004   negative   CARDIAC CATHETERIZATION  02/08/2020   CLIPPING OF ATRIAL APPENDAGE N/A 02/11/2020   Procedure: Clipping Of Atrial Appendage using AtriCure 35 MM AtriClip.;  Surgeon: Wonda Olds, MD;  Location: [facility] OR;  Service: Open Heart Surgery;  Laterality: N/A;   COLONOSCOPY  04/2005   COPD exacerbation     CORONARY ANGIOPLASTY  01/09/2018   CORONARY ARTERY BYPASS GRAFT N/A 02/11/2020   Procedure: CORONARY ARTERY BYPASS GRAFTING (CABG) using LIMA to LAD; RIMA to PL; Endoscopic right greater saphenous vein harvest: SVC to Diag; SVC to OM1.;  Surgeon: Wonda Olds, MD;  Location:  [place] OR;  Service: Open Heart Surgery;  Laterality: N/A;  BILATERAL IMA   CORONARY STENT INTERVENTION N/A 01/10/2018   Procedure: CORONARY STENT INTERVENTION;  Surgeon: Lorretta Harp, MD;  Location: [place] CV [facility];  Service: Cardiovascular;  Laterality: N/A;   CORONARY/GRAFT ACUTE MI REVASCULARIZATION N/A 01/10/2018   Procedure: Coronary/Graft Acute MI Revascularization;  Surgeon: Lorretta Harp, MD;  Location: [place] [facility];  Service: Cardiovascular;   Laterality: N/A;   EMBOLIZATION Left 03/19/2018   EMBOLIZATION Left 03/19/2018   Procedure: EMBOLIZATION - Left Internal;  Surgeon: Conrad Enterprise, MD;  Location: [place] [facility];  Service: Cardiovascular;  Laterality: Left;   EMBOLIZATION Right 04/16/2018   Procedure: EMBOLIZATION;  Surgeon: Conrad Newington Forest, MD;  Location: [place] CV [facility];  Service: Cardiovascular;  Laterality: Right;   ENDOVEIN HARVEST OF GREATER SAPHENOUS VEIN Right 02/11/2020   Procedure: [place] Of Greater Saphenous Vein using right lower extremity.;  Surgeon: Wonda Olds, MD;  Location: [facility] OR;  Service: Open Heart Surgery;  Laterality: Right;   FEMORAL ARTERY EXPLORATION Right 05/07/2018   Procedure: FEMORAL ARTERY EXPLORATION, EXTENDED PROFUNDAPLASTY;  Surgeon: Conrad Devola, MD;  Location: [place];  Service: Vascular;  Laterality: Right;   INTRAOPERATIVE ARTERIOGRAM Right 05/07/2018   Procedure: INTRA OPERATIVE ARTERIOGRAM;  Surgeon: Conrad [place], MD;  Location: [place];  Service: Vascular;  Laterality: Right;   LEFT HEART CATH AND CORONARY ANGIOGRAPHY N/A 01/10/2018   Procedure: LEFT HEART CATH AND CORONARY ANGIOGRAPHY;  Surgeon: Lorretta Harp, MD;  Location: [place] [facility];  Service: Cardiovascular;  Laterality: N/A;   LEFT HEART CATH AND CORONARY ANGIOGRAPHY N/A 02/08/2020   Procedure: LEFT HEART CATH AND CORONARY ANGIOGRAPHY;  Surgeon: Troy Sine, MD;  Location: [place] [facility];  Service: Cardiovascular;  Laterality: N/A;   PACEMAKER INSERTION  11/2005   PACEMAKER LEAD REMOVAL N/A 10/17/2014   Procedure: PACEMAKER LEAD REMOVAL;  Surgeon: Evans Lance, MD;  Location: [place];  Service: Cardiovascular;  Laterality: N/A;   RENAL ANGIOGRAPHY Left 04/16/2018   Procedure: RENAL ANGIOGRAPHY;  Surgeon: Conrad Moca, MD;  Location: [place] CV [facility];  Service: Cardiovascular;  Laterality: Left;   RIGHT/LEFT HEART CATH AND CORONARY/GRAFT ANGIOGRAPHY N/A 07/31/2022   Procedure: RIGHT/LEFT HEART CATH AND  CORONARY/GRAFT ANGIOGRAPHY;  Surgeon: Burnell Blanks, MD;  Location: [place] [facility];  Service: Cardiovascular;  Laterality: N/A;   s/p ICD placement      Medtronic Maximo [phone] single chamber   TEE WITHOUT CARDIOVERSION N/A 02/11/2020   Procedure: TRANSESOPHAGEAL ECHOCARDIOGRAM (TEE);  Surgeon: Wonda Olds, MD;  Location: [place];  Service: Open Heart Surgery;  Laterality: N/A;    Social History   Socioeconomic History   Marital status: Married    Spouse name: Not on file   Number of children: 2   Years of education: Not on file   Highest education level: Not on file  Occupational History   Occupation: Grave digger--now disabled  Tobacco Use   Smoking status: Former    Packs/day: 2.50    Years: 40.00    Total pack years: 100.00    Types: Cigarettes    Quit date: 01/09/2018    Years since quitting: 4.5   Smokeless tobacco: Never   Tobacco comments:    GAVE 1-800-[id number]-NOW  Vaping Use   Vaping Use: Never used  Substance and Sexual Activity   Alcohol use: No   Drug use: No   Sexual activity:  Not Currently  Other Topics Concern   Not on file  Social History Narrative   No living will   Requests wife as health care POA--alternate is daughter or son   Would accept resuscitation but doesn't want prolonged ventilation   No tube feeds if cognitively unaware   Social Determinants of Health   Financial Resource Strain: Not on file  Food Insecurity: Not on file  Transportation Needs: Not on file  Physical Activity: Not on file  Stress: Not on file  Social Connections: Not on file  Intimate Partner Violence: Not on file   Family History  Problem Relation Age of Onset   Stroke Father    Coronary artery disease Maternal Aunt    Heart failure Maternal Aunt    Lung cancer Maternal Aunt    Lung cancer Maternal Uncle    Cancer Brother        Mouth   Cancer Sister        throat    Current Outpatient Medications  Medication Sig Dispense Refill   albuterol  (VENTOLIN HFA) 108 (90 Base) MCG/ACT inhaler Inhale 2 puffs into the lungs every 6 (six) hours as needed for wheezing or shortness of breath. 18 g 0   aspirin 81 MG chewable tablet Chew 1 tablet (81 mg total) by mouth daily.     atorvastatin (LIPITOR) 80 MG tablet Take 1 tablet (80 mg total) by mouth daily at 6 PM. 90 tablet 3   Budeson-Glycopyrrol-Formoterol (BREZTRI AEROSPHERE) 160-9-4.8 MCG/ACT AERO Inhale 2 puffs into the lungs in the morning and at bedtime. 5.9 g 0   clopidogrel (PLAVIX) 75 MG tablet TAKE 1 TABLET BY MOUTH EVERY DAY 90 tablet 3   diclofenac Sodium (VOLTAREN) 1 % GEL Apply 1 Application topically 3 (three) times daily as needed (pain).     fluticasone (FLONASE) 50 MCG/ACT nasal spray Place 1 spray into both nostrils daily. 16 g 2   furosemide (LASIX) 20 MG tablet Take 1 tablet (20 mg total) by mouth daily. 90 tablet 3   gabapentin (NEURONTIN) 100 MG capsule TAKE 1 CAPSULE BY MOUTH 3 TIMES DAILY (Patient taking differently: Take 100 mg by mouth at bedtime.) 90 capsule 3   glipiZIDE (GLUCOTROL) 10 MG tablet Take 1 tablet (10 mg total) by mouth 2 (two) times daily before a meal. 180 tablet 3   isosorbide mononitrate (IMDUR) 30 MG 24 hr tablet TAKE 1 TABLET BY MOUTH EVERY DAY IN THE EVENING 90 tablet 3   metFORMIN (GLUCOPHAGE-XR) 500 MG 24 hr tablet TAKE 2 TABLETS BY MOUTH EVERY DAY WITH BREAKFAST 180 tablet 3   metoprolol tartrate (LOPRESSOR) 25 MG tablet TAKE 1 TABLET BY MOUTH 2 TIMES A DAY 180 tablet 3   Multiple Vitamin (MULTIVITAMIN WITH MINERALS) TABS tablet Take 1 tablet by mouth daily.     nitroGLYCERIN (NITROSTAT) 0.4 MG SL tablet Place 0.4 mg under the tongue every 5 (five) minutes as needed for chest pain.     pantoprazole (PROTONIX) 40 MG tablet Take 1 tablet (40 mg total) by mouth 2 (two) times daily. 180 tablet 3   sacubitril-valsartan (ENTRESTO) 97-103 MG Take 1 tablet by mouth 2 (two) times daily. 60 tablet 11   vitamin B-12 (CYANOCOBALAMIN) 1000 MCG tablet Take  1,000 mcg by mouth daily.     Current Facility-Administered Medications  Medication Dose Route Frequency Provider Last Rate Last Admin   sodium chloride flush (NS) 0.9 % injection 3 mL  3 mL Intravenous Q12H Minus Breeding, MD  Allergies  Allergen Reactions   Spironolactone Other (See Comments)    hyperkalemia     REVIEW OF SYSTEMS:  '[X]'$  denotes positive finding, '[ ]'$  denotes negative finding Cardiac  Comments:  Chest pain or chest pressure:    Shortness of breath upon exertion:    Short of breath when lying flat:    Irregular heart rhythm:        Vascular    Pain in calf, thigh, or hip brought on by ambulation: X   Pain in feet at night that wakes you up from your sleep:     Blood clot in your veins:    Leg swelling:         Pulmonary    Oxygen at home:    Productive cough:     Wheezing:         Neurologic    Sudden weakness in arms or legs:     Sudden numbness in arms or legs:     Sudden onset of difficulty speaking or slurred speech:    Temporary loss of vision in one eye:     Problems with dizziness:         Gastrointestinal    Blood in stool:     Vomited blood:         Genitourinary    Burning when urinating:     Blood in urine:        Psychiatric    Major depression:         Hematologic    Bleeding problems:    Problems with blood clotting too easily:        Skin    Rashes or ulcers:        Constitutional    Fever or chills:      PHYSICAL EXAMINATION:  There were no vitals filed for this visit.  General:  WDWN in NAD; vital signs documented above Gait: Not observed HENT: WNL, normocephalic Pulmonary: normal non-labored breathing , without wheezing Cardiac: regular HR Abdomen: soft, NT, no masses Skin: without rashes Vascular Exam/Pulses:  Right Left  Radial 2+ (normal) 2+ (normal)  Ulnar 2+ (normal) 2+ (normal)  Femoral    Popliteal    DP 2+ (normal) 2+ (normal)       Extremities: without ischemic changes, without Gangrene  , without cellulitis; without open wounds;  Musculoskeletal: no muscle wasting or atrophy  Neurologic: A&O X 3;  No focal weakness or paresthesias are detected Psychiatric:  The pt has Normal affect.   Non-Invasive Vascular Imaging:    IMPRESSION: Redemonstration of EVAR as endovascular solution for abdominal aortic aneurysm and bilateral common iliac artery aneurysm, with infrarenal fixation and bilateral hypogastric artery coil embolization. No late complicating features, with no evidence of endoleak.  Severe triple vessel CAD s/p 4V CABG. No vein graft or free RIMA graft markers placed at the time of his surgery. Difficult to locate grafts because of this.  Severe mid LAD stenosis. The mid and distal LAD fills from the patent LIMA graft Severe Diagonal stenosis. Patent SVG to Diagonal Moderate ostial Circumflex stenosis. Patent mid Circumflex stent. Occluded obtuse marginal. Patent vein graft to obtuse marginal branch Chronic occlusion of the proximal RCA. The graft (free RIMA) to this vessel could not be located. As above, no graft markers in place. No collateral filling of the native RCA. This graft is presumed to be patent.  Normal right and left heart pressures (RA 2, RV 25/1/3, PA 26/5 mean 14, PCWP 5, LV 144/3/12,  AO 142/59   Recommendations: Continue medical management of CAD. His dyspnea is likely multifactorial given COPD, sleep apnea, deconditioning, morbid obesity and underlying CAD.   Redemonstration endovascular repair of abdominal aortic aneurysm and bilateral common iliac artery aneurysm with EVAR solution, infrarenal fixation. The proximal stent graft terminates below the main left renal artery without evidence of migration. Remodeling of the excluded aneurysm sac, which measures smaller than the baseline of 4.2 cm, currently 3.7 cm. No evidence of endoleak on the arterial or late phase imaging.   ASSESSMENT/PLAN: [name] is a 66 y.o. male presenting to  reestablish care status post 2019 EVAR with bilateral hypo gastric artery coil embolization through a left-sided common femoral artery cutdown.  The case was complicated by right lower extremity acute limb ischemia requiring femoral endarterectomy profundoplasty with bovine pericardial patch. All of the above was performed by Dr. Bridgett Larsson.   Recent CT angio abdomen pelvis on 07/30/2022 was reviewed demonstrating smaller abdominal aortic aneurysm size, bilateral iliac aneurysm sizes, no endoleak.  A long discussion with Corey regarding the above.  I am happy that his aneurysm appears sealed, and is shrinking in size.  Regarding his bilateral lower extremity claudication, with his significant COPD, morbid obesity, back pain, I do not think lower extremity intervention would provide significant benefit.  Should Nyko's claudication symptoms worsen to ischemic rest pain, tissue loss, we would discuss lower extremity intervention.  At this time, he would be best treated with continued medical management, walking routine, weight loss.    Plan to see him back in the office in 1 year for repeat imaging studies.   Broadus Omarri, MD [facility] [phone]

## 2022-08-09 ENCOUNTER — Ambulatory Visit (INDEPENDENT_AMBULATORY_CARE_PROVIDER_SITE_OTHER): Payer: Medicare Other | Admitting: Vascular Surgery

## 2022-08-09 ENCOUNTER — Encounter: Payer: Self-pay | Admitting: Vascular Surgery

## 2022-08-09 VITALS — BP 121/74 | HR 60 | Temp 98.1°F | Resp 20 | Ht 70.0 in | Wt 281.4 lb

## 2022-08-09 DIAGNOSIS — Z8679 Personal history of other diseases of the circulatory system: Secondary | ICD-10-CM

## 2022-08-09 DIAGNOSIS — I739 Peripheral vascular disease, unspecified: Secondary | ICD-10-CM

## 2022-08-09 DIAGNOSIS — Z9889 Other specified postprocedural states: Secondary | ICD-10-CM | POA: Diagnosis not present

## 2022-08-09 DIAGNOSIS — I723 Aneurysm of iliac artery: Secondary | ICD-10-CM

## 2022-08-12 NOTE — Progress Notes (Unsigned)
Office Visit    Patient Name: [name] Date of Encounter: 08/13/2022  Primary Care Provider:  Venia Carbon, MD Primary Cardiologist:  Minus Breeding, MD  Chief Complaint    66 year old male with a history of CAD/P CABG x4 in 2021, L AAA clipping the time of his bypass surgery, chronic systolic heart failure/ICM s/p ICD, PVD, AAA s/p EVAR, hypertension, hyperlipidemia, OSA, COPD, type 2 diabetes, GERD, arthritis, and tobacco use who presents for follow-up related to CAD and ICM.   Past Medical History    Past Medical History:  Diagnosis Date   Arthritis    Automatic implantable cardioverter-defibrillator in situ    COPD (chronic obstructive pulmonary disease) ([place])    emphysema by CXR   Coronary artery disease    Diabetes mellitus    type 2  no meds   Diverticulosis of colon    GERD (gastroesophageal reflux disease)    with esophagitis   History of colonic polyps    Hypertension    Non-ischemic cardiomyopathy ([place])    Presence of permanent cardiac pacemaker    PVD (peripheral vascular disease) ([place])    bilateral common iliac artery aneurysms. Right SFA occlusion over a long segment. Left SFA disease with occlusion of the left TP trunk. Artirogram Oct. 2006   Sleep apnea    Tobacco abuse    Urinary incontinence    detrussor instability   Past Surgical History:  Procedure Laterality Date   ABDOMINAL AORTIC ENDOVASCULAR STENT GRAFT N/A 05/07/2018   Procedure: ABDOMINAL AORTIC ENDOVASCULAR STENT GRAFT;  Surgeon: Conrad [place], MD;  Location: [facility];  Service: Vascular;  Laterality: N/A;   CARDIAC CATHETERIZATION  06/2004   negative   CARDIAC CATHETERIZATION  02/08/2020   CLIPPING OF ATRIAL APPENDAGE N/A 02/11/2020   Procedure: Clipping Of Atrial Appendage using AtriCure 35 MM AtriClip.;  Surgeon: Wonda Olds, MD;  Location: [facility];  Service: Open Heart Surgery;  Laterality: N/A;   COLONOSCOPY  04/2005   COPD exacerbation     CORONARY ANGIOPLASTY  01/09/2018    CORONARY ARTERY BYPASS GRAFT N/A 02/11/2020   Procedure: CORONARY ARTERY BYPASS GRAFTING (CABG) using LIMA to LAD; RIMA to PL; Endoscopic right greater saphenous vein harvest: SVC to Diag; SVC to OM1.;  Surgeon: Wonda Olds, MD;  Location: [facility];  Service: Open Heart Surgery;  Laterality: N/A;  BILATERAL IMA   CORONARY STENT INTERVENTION N/A 01/10/2018   Procedure: CORONARY STENT INTERVENTION;  Surgeon: Lorretta Harp, MD;  Location: [facility];  Service: Cardiovascular;  Laterality: N/A;   CORONARY/GRAFT ACUTE MI REVASCULARIZATION N/A 01/10/2018   Procedure: Coronary/Graft Acute MI Revascularization;  Surgeon: Lorretta Harp, MD;  Location: [facility];  Service: Cardiovascular;  Laterality: N/A;   EMBOLIZATION Left 03/19/2018   EMBOLIZATION Left 03/19/2018   Procedure: EMBOLIZATION - Left Internal;  Surgeon: Conrad [place], MD;  Location: [facility] [facility];  Service: Cardiovascular;  Laterality: Left;   EMBOLIZATION Right 04/16/2018   Procedure: EMBOLIZATION;  Surgeon: Conrad [place], MD;  Location: [facility];  Service: Cardiovascular;  Laterality: Right;   ENDOVEIN HARVEST OF GREATER SAPHENOUS VEIN Right 02/11/2020   Procedure: [place] Of Greater Saphenous Vein using right lower extremity.;  Surgeon: Wonda Olds, MD;  Location: [facility];  Service: Open Heart Surgery;  Laterality: Right;   FEMORAL ARTERY EXPLORATION Right 05/07/2018   Procedure: FEMORAL ARTERY EXPLORATION, EXTENDED PROFUNDAPLASTY;  Surgeon: Conrad [place], MD;  Location:  [facility];  Service: Vascular;  Laterality: Right;   INTRAOPERATIVE ARTERIOGRAM Right 05/07/2018   Procedure: INTRA OPERATIVE ARTERIOGRAM;  Surgeon: Conrad [place], MD;  Location: [facility];  Service: Vascular;  Laterality: Right;   LEFT HEART CATH AND CORONARY ANGIOGRAPHY N/A 01/10/2018   Procedure: LEFT HEART CATH AND CORONARY ANGIOGRAPHY;  Surgeon: Lorretta Harp, MD;  Location: [facility] [facility];  Service: Cardiovascular;   Laterality: N/A;   LEFT HEART CATH AND CORONARY ANGIOGRAPHY N/A 02/08/2020   Procedure: LEFT HEART CATH AND CORONARY ANGIOGRAPHY;  Surgeon: Troy Sine, MD;  Location: [place] [facility];  Service: Cardiovascular;  Laterality: N/A;   PACEMAKER INSERTION  11/2005   PACEMAKER LEAD REMOVAL N/A 10/17/2014   Procedure: PACEMAKER LEAD REMOVAL;  Surgeon: Evans Lance, MD;  Location: [facility];  Service: Cardiovascular;  Laterality: N/A;   RENAL ANGIOGRAPHY Left 04/16/2018   Procedure: RENAL ANGIOGRAPHY;  Surgeon: Conrad Savoy, MD;  Location: [place];  Service: Cardiovascular;  Laterality: Left;   RIGHT/LEFT HEART CATH AND CORONARY/GRAFT ANGIOGRAPHY N/A 07/31/2022   Procedure: RIGHT/LEFT HEART CATH AND CORONARY/GRAFT ANGIOGRAPHY;  Surgeon: Burnell Blanks, MD;  Location: [place] [facility];  Service: Cardiovascular;  Laterality: N/A;   s/p ICD placement      Medtronic Maximo [phone] single chamber   TEE WITHOUT CARDIOVERSION N/A 02/11/2020   Procedure: TRANSESOPHAGEAL ECHOCARDIOGRAM (TEE);  Surgeon: Wonda Olds, MD;  Location: [facility];  Service: Open Heart Surgery;  Laterality: N/A;    Allergies  Allergies  Allergen Reactions   Spironolactone Other (See Comments)    hyperkalemia    History of Present Illness    66 year old male with the above past medical history including CAD/P CABG x4 in 2021, L AAA clipping the time of his bypass surgery, chronic systolic heart failure/ICM s/p ICD, PVD, AAA s/p EVAR, hypertension,  hyperlipidemia, OSA, COPD, type 2 diabetes, GERD, arthritis, and tobacco use.   He has a history of CAD initially diagnosed by cardiac cath in 2012, s/p NSTEMI, DES-p-mLCx, DES-mRCA in 2019. Cardiac catheterization in February 2021 showed severe multivessel CAD.  He underwent CABG x4 (LIMA-LAD; RIMA-PL; SVG-Diag; SVG-OM1) in February 2021.  He had left atrial clipping at the time of his CABG. Additionally, he has a history of ICM, s/p ICD in 2012. At [facility], Dr.  Lovena Le elected not to replace his ICD. Echocardiogram at the time of his bypass surgery showed EF 45 to 50%, mildly decreased LV function, LV global hypokinesis, G1 DD, no significant valvular abnormalities. Carotid Dopplers at the time of his surgery showed 1 to 39% B ICA stenosis.  EKG in November 2021 showed new lateral T wave inversion. Lexiscan Myoview in December 2021 showed no evidence of ischemia.  He has a history of peripheral vascular disease and underwent aorto bi-iliac stent graft placement with left common femoral artery repair in May 2019, followed by vascular surgery. Additionally, he has a history of COPD and follows with pulmonology.   He was seen in the office in March 2023 and reported some chest discomfort, though he declined further testing.  He was started on Entresto in the setting of ICM.  Delene Loll was subsequently increased.  Spironolactone was not initiated in the setting of elevated potassium. He was last seen in the office on 07/29/2022 and of shortness of breath.  He was referred for right and left heart catheterization.  Delene Loll was increased and he was started on Farxiga, however this was later switched to [employer]  for cost reasons.  He underwent R/LHC on 07/31/2022 which revealed severe triple-vessel CAD s/p CABG x4, severe mid LAD stenosis (mid and distal LAD filling from patent LIMA graft), severe diagonal stenosis with patent SVG-diagonal, moderate ostial circumflex stenosis with patent mid circumflex stent, occluded OM with patent vein graft to OM branch, CTO of P RCA unable to locate graft (free RIMA), no collateral filling of native RCA, graft presumed to be patent, normal R/L heart pressures.  Need medical management of CAD was recommended.  His dyspnea was thought to be likely multifactorial given COPD, sleep apnea, morbid obesity, deconditioning, and underlying CAD. He was discharged home postprocedure in stable condition.    He presents today for follow-up accompanied by  his wife. Since his last visit and since his procedure has been stable from a cardiac standpoint.  He has noted rare "twinges" in his chest that last for seconds, denies any other symptoms concerning for angina.  He has stable chronic dyspnea.  He denies edema, PND, orthopnea.  He is awaiting approval financial assistance for Jardiance.  He dropped of his paperwork to our office today.  He has not been able to take this medication for months due to cost.  Otherwise, he reports feeling well and denies any additional concerns today.  Home Medications    Current Outpatient Medications  Medication Sig Dispense Refill   albuterol (VENTOLIN HFA) 108 (90 Base) MCG/ACT inhaler Inhale 2 puffs into the lungs every 6 (six) hours as needed for wheezing or shortness of breath. 18 g 0   aspirin 81 MG chewable tablet Chew 1 tablet (81 mg total) by mouth daily.     atorvastatin (LIPITOR) 80 MG tablet Take 1 tablet (80 mg total) by mouth daily at 6 PM. 90 tablet 3   Budeson-Glycopyrrol-Formoterol (BREZTRI AEROSPHERE) 160-9-4.8 MCG/ACT AERO Inhale 2 puffs into the lungs in the morning and at bedtime. 5.9 g 0   clopidogrel (PLAVIX) 75 MG tablet TAKE 1 TABLET BY MOUTH EVERY DAY 90 tablet 3   diclofenac Sodium (VOLTAREN) 1 % GEL Apply 1 Application topically 3 (three) times daily as needed (pain).     fluticasone (FLONASE) 50 MCG/ACT nasal spray Place 1 spray into both nostrils daily. 16 g 2   furosemide (LASIX) 20 MG tablet Take 1 tablet (20 mg total) by mouth daily. 90 tablet 3   gabapentin (NEURONTIN) 100 MG capsule TAKE 1 CAPSULE BY MOUTH 3 TIMES DAILY (Patient taking differently: Take 100 mg by mouth at bedtime.) 90 capsule 3   glipiZIDE (GLUCOTROL) 10 MG tablet Take 1 tablet (10 mg total) by mouth 2 (two) times daily before a meal. 180 tablet 3   isosorbide mononitrate (IMDUR) 30 MG 24 hr tablet TAKE 1 TABLET BY MOUTH EVERY DAY IN THE EVENING 90 tablet 3   metFORMIN (GLUCOPHAGE-XR) 500 MG 24 hr tablet TAKE 2  TABLETS BY MOUTH EVERY DAY WITH BREAKFAST 180 tablet 3   metoprolol tartrate (LOPRESSOR) 25 MG tablet TAKE 1 TABLET BY MOUTH 2 TIMES A DAY 180 tablet 3   Multiple Vitamin (MULTIVITAMIN WITH MINERALS) TABS tablet Take 1 tablet by mouth daily.     nitroGLYCERIN (NITROSTAT) 0.4 MG SL tablet Place 0.4 mg under the tongue every 5 (five) minutes as needed for chest pain.     pantoprazole (PROTONIX) 40 MG tablet Take 1 tablet (40 mg total) by mouth 2 (two) times daily. 180 tablet 3   sacubitril-valsartan (ENTRESTO) 97-103 MG Take 1 tablet by mouth 2 (two) times daily.  60 tablet 11   vitamin B-12 (CYANOCOBALAMIN) 1000 MCG tablet Take 1,000 mcg by mouth daily.     Current Facility-Administered Medications  Medication Dose Route Frequency Provider Last Rate Last Admin   sodium chloride flush (NS) 0.9 % injection 3 mL  3 mL Intravenous Q12H Minus Breeding, MD         Review of Systems    He denies chest pain, palpitations, dyspnea, pnd, orthopnea, n, v, dizziness, syncope, edema, weight gain, or early satiety. All other systems reviewed and are otherwise negative except as noted above.   Physical Exam    VS:  BP 120/70   Pulse 61   Ht '5\' 10"'$  (6.387 m)   Wt 283 lb 12.8 oz (128.7 kg)   SpO2 97%   BMI 40.72 kg/m   GEN: Well nourished, well developed, in no acute distress. HEENT: normal. Neck: Supple, no JVD, carotid bruits, or masses. Cardiac: RRR, no murmurs, rubs, or gallops. No clubbing, cyanosis, edema.  Radials/DP/PT 2+ and equal bilaterally.  Respiratory:  Respirations regular and unlabored, clear to auscultation bilaterally. GI: Soft, nontender, nondistended, BS + x 4. MS: no deformity or atrophy. Skin: warm and dry, no rash. Neuro:  Strength and sensation are intact. Psych: Normal affect.  Accessory Clinical Findings    ECG personally reviewed by me today - Sinus rhythm, 61 bpm, 1st degree AV block - no acute changes.   Lab Results  Component Value Date   WBC 6.9 07/29/2022    HGB 11.6 (L) 07/31/2022   HCT 34.0 (L) 07/31/2022   MCV 69 (L) 07/29/2022   PLT 293 07/29/2022   Lab Results  Component Value Date   CREATININE 0.87 07/29/2022   BUN 11 07/29/2022   NA 143 07/31/2022   K 3.7 07/31/2022   CL 100 07/29/2022   CO2 25 07/29/2022   Lab Results  Component Value Date   ALT 22 04/09/2022   AST 18 04/09/2022   ALKPHOS 99 04/09/2022   BILITOT 0.7 04/09/2022   Lab Results  Component Value Date   CHOL 113 05/09/2021   HDL 36.30 (L) 05/09/2021   LDLCALC 51 11/03/2020   LDLDIRECT 24.0 05/09/2021   TRIG 308.0 (H) 05/09/2021   CHOLHDL 3 05/09/2021    Lab Results  Component Value Date   HGBA1C 8.1 (A) 05/14/2022    Assessment & Plan    1. CAD: S/p CABG x4 in 2021. R/LHC on 07/31/2022 revealed severe triple-vessel CAD s/p CABG x4, severe mid LAD stenosis (mid and distal LAD filling from patent LIMA graft), severe diagonal stenosis with patent SVG-diagonal, moderate ostial circumflex stenosis with patent mid circumflex stent, occluded OM with patent vein graft to OM branch, CTO of P RCA unable to locate graft (free RIMA), no collateral filling of native RCA, graft presumed to be patent, normal R/L heart pressures.  Medical management was recommended.  Stable with no anginal symptoms.  Continue aspirin, Plavix, isosorbide, metoprolol, Entresto, Lasix, and Lipitor.  2.  Chronic systolic heart failure/ICM: S/p ICD in 2012, was not replaced at [facility]. Echo in 2021 showed EF 45 to 50%, mildly decreased LV function, LV global hypokinesis, G1 DD, no significant valvular abnormalities. Euvolemic and well compensated on exam.  Continue current medications as above.  He is pending financial assistance for Vania Rea (previously unable to afford [place]).    3. Hypertension: BP well controlled. Continue current antihypertensive regimen.   4. Hyperlipidemia: dLDL was 24 in 04/2021. Consider repeat lipid panel at next follow-up visit.  Continue aspirin, Lipitor.   5. PVD/AAA  s/p EVAR: S/p aorto bi-iliac stent graft placement with left common femoral artery repair in May 2019, CT angio abdomen/pelvis in 07/2022 was stable.  Following with vascular surgery.  6. OSA: On CPAP.  Follows with pulmonology.   7. COPD: He has stable chronic dyspnea on exertion.  Following with pulmonology.   8. Type 2 diabetes: A1C was 8.4 in November 2022.  Monitored and managed per PCP.   9. Disposition: Follow-up in 4-6 months with Dr. Percival Spanish.      Lenna Sciara, NP 08/13/2022, 12:14 PM

## 2022-08-13 ENCOUNTER — Encounter: Payer: Self-pay | Admitting: Nurse Practitioner

## 2022-08-13 ENCOUNTER — Ambulatory Visit: Payer: Medicare Other | Admitting: Nurse Practitioner

## 2022-08-13 VITALS — BP 120/70 | HR 61 | Ht 70.0 in | Wt 283.8 lb

## 2022-08-13 DIAGNOSIS — I2510 Atherosclerotic heart disease of native coronary artery without angina pectoris: Secondary | ICD-10-CM | POA: Diagnosis not present

## 2022-08-13 DIAGNOSIS — I714 Abdominal aortic aneurysm, without rupture, unspecified: Secondary | ICD-10-CM

## 2022-08-13 DIAGNOSIS — I5022 Chronic systolic (congestive) heart failure: Secondary | ICD-10-CM | POA: Diagnosis not present

## 2022-08-13 DIAGNOSIS — I10 Essential (primary) hypertension: Secondary | ICD-10-CM | POA: Diagnosis not present

## 2022-08-13 DIAGNOSIS — I255 Ischemic cardiomyopathy: Secondary | ICD-10-CM

## 2022-08-13 DIAGNOSIS — Z95828 Presence of other vascular implants and grafts: Secondary | ICD-10-CM

## 2022-08-13 DIAGNOSIS — G4733 Obstructive sleep apnea (adult) (pediatric): Secondary | ICD-10-CM

## 2022-08-13 DIAGNOSIS — E785 Hyperlipidemia, unspecified: Secondary | ICD-10-CM

## 2022-08-13 DIAGNOSIS — I739 Peripheral vascular disease, unspecified: Secondary | ICD-10-CM

## 2022-08-13 DIAGNOSIS — E118 Type 2 diabetes mellitus with unspecified complications: Secondary | ICD-10-CM

## 2022-08-13 DIAGNOSIS — J449 Chronic obstructive pulmonary disease, unspecified: Secondary | ICD-10-CM

## 2022-08-13 NOTE — Patient Instructions (Signed)
Medication Instructions:  Your physician recommends that you continue on your current medications as directed. Please refer to the Current Medication list given to you today.   *If you need a refill on your cardiac medications before your next appointment, please call your pharmacy*   Lab Work: NONE ordered at this time of appointment   If you have labs (blood work) drawn today and your tests are completely normal, you will receive your results only by: [place] (if you have MyChart) OR A paper copy in the mail If you have any lab test that is abnormal or we need to change your treatment, we will call you to review the results.   Testing/Procedures: NONE ordered at this time of appointment     Follow-Up: At [facility], you and your health needs are our priority.  As part of our continuing mission to provide you with exceptional heart care, we have created designated Provider Care Teams.  These Care Teams include your primary Cardiologist (physician) and Advanced Practice Providers (APPs -  Physician Assistants and Nurse Practitioners) who all work together to provide you with the care you need, when you need it.  We recommend signing up for the patient portal called "MyChart".  Sign up information is provided on this After Visit Summary.  MyChart is used to connect with patients for Virtual Visits (Telemedicine).  Patients are able to view lab/test results, encounter notes, upcoming appointments, etc.  Non-urgent messages can be sent to your provider as well.   To learn more about what you can do with MyChart, go to [email or web address].    Your next appointment:   4-6 month(s)  The format for your next appointment:   In Person  Provider:   Minus Breeding, MD     Other Instructions   Important Information About Sugar

## 2022-08-15 ENCOUNTER — Telehealth: Payer: Self-pay | Admitting: *Deleted

## 2022-08-15 NOTE — Patient Outreach (Signed)
  Care Coordination   08/15/2022 Name: [name] MRN: [id number] DOB: [birth year]   Care Coordination Outreach Attempts:  An unsuccessful telephone outreach was attempted today to offer the patient information about available care coordination services as a benefit of their health plan.   Follow Up Plan:  Additional outreach attempts will be made to offer the patient care coordination information and services.   Encounter Outcome:  Pt. Request to Call Back  Care Coordination Interventions Activated:  No   Care Coordination Interventions:  No, not indicated    Emelia Loron RN, BSN [place] [phone] [email or web address]

## 2022-08-29 ENCOUNTER — Encounter: Payer: Self-pay | Admitting: Cardiology

## 2022-10-11 ENCOUNTER — Other Ambulatory Visit: Payer: Self-pay | Admitting: Pulmonary Disease

## 2022-10-14 ENCOUNTER — Encounter: Payer: Self-pay | Admitting: Cardiology

## 2022-10-15 ENCOUNTER — Other Ambulatory Visit: Payer: Self-pay | Admitting: Vascular Surgery

## 2022-10-22 ENCOUNTER — Other Ambulatory Visit: Payer: Self-pay

## 2022-10-22 MED ORDER — SACUBITRIL-VALSARTAN 97-103 MG PO TABS: 1.0000 | ORAL_TABLET | Freq: Two times a day (BID) | ORAL | 3 refills | Status: DC

## 2022-11-19 ENCOUNTER — Telehealth: Payer: Self-pay | Admitting: Pharmacist

## 2022-11-19 ENCOUNTER — Ambulatory Visit (INDEPENDENT_AMBULATORY_CARE_PROVIDER_SITE_OTHER): Payer: Medicare Other | Admitting: Internal Medicine

## 2022-11-19 ENCOUNTER — Encounter: Payer: Self-pay | Admitting: *Deleted

## 2022-11-19 ENCOUNTER — Encounter: Payer: Self-pay | Admitting: Internal Medicine

## 2022-11-19 VITALS — BP 116/76 | HR 56 | Temp 97.7°F | Ht 70.0 in | Wt 286.0 lb

## 2022-11-19 DIAGNOSIS — E1151 Type 2 diabetes mellitus with diabetic peripheral angiopathy without gangrene: Secondary | ICD-10-CM | POA: Diagnosis not present

## 2022-11-19 DIAGNOSIS — M19011 Primary osteoarthritis, right shoulder: Secondary | ICD-10-CM

## 2022-11-19 DIAGNOSIS — I5022 Chronic systolic (congestive) heart failure: Secondary | ICD-10-CM

## 2022-11-19 DIAGNOSIS — E1140 Type 2 diabetes mellitus with diabetic neuropathy, unspecified: Secondary | ICD-10-CM | POA: Diagnosis not present

## 2022-11-19 DIAGNOSIS — Z23 Encounter for immunization: Secondary | ICD-10-CM

## 2022-11-19 DIAGNOSIS — K2100 Gastro-esophageal reflux disease with esophagitis, without bleeding: Secondary | ICD-10-CM

## 2022-11-19 DIAGNOSIS — D179 Benign lipomatous neoplasm, unspecified: Secondary | ICD-10-CM | POA: Insufficient documentation

## 2022-11-19 LAB — POCT GLYCOSYLATED HEMOGLOBIN (HGB A1C): Hemoglobin A1C: 9.1 % — AB (ref 4.0–5.6)

## 2022-11-19 NOTE — Assessment & Plan Note (Signed)
Compensated with the entresto and furosemide

## 2022-11-19 NOTE — Telephone Encounter (Signed)
Will contact [employer] to give verbal Rx for Jardiance 25 mg #90 with 1 RF.

## 2022-11-19 NOTE — Telephone Encounter (Signed)
-----   Message from Venia Carbon, MD sent at 11/19/2022 10:07 AM EST ----- Mendel Ryder, He has jardiance '10mg'$  through patient assistance. Can you see if you can help him get the '25mg'$  dose--I would like to increase it. Thanks.

## 2022-11-19 NOTE — Assessment & Plan Note (Signed)
May have some bursitis also Discussed increasing the voltaren gel to tid If really worsens, can try cortisone injection (reluctant due to diabetes problems)

## 2022-11-19 NOTE — Assessment & Plan Note (Signed)
On right arm Reassured--no action needed

## 2022-11-19 NOTE — Assessment & Plan Note (Signed)
Increased symptoms Likely due to worsened eating Discussed improved eating and make sure pantoprazole is on an empty stomach

## 2022-11-19 NOTE — Addendum Note (Signed)
Addended by: Pilar Grammes on: 11/19/2022 02:33 PM   Modules accepted: Orders

## 2022-11-19 NOTE — Progress Notes (Signed)
Subjective:    Patient ID: [name], male    DOB: [birth year], 66 y.o.   MRN: [id number]  HPI Here for follow up of diabetes and other chronic health conditions  Having bad shoulder pain on right Thinks he needs cortisone shot--helped on left in past  Ate a lot of sweets over the past 2 weeks Knows his sugars were high  Having water brash after eating frequently Is taking protonix---not always on empty stomach  Has 3 lumps on right arm---not new Slight tenderness  Current Outpatient Medications on File Prior to Visit  Medication Sig Dispense Refill   albuterol (VENTOLIN HFA) 108 (90 Base) MCG/ACT inhaler Inhale 2 puffs into the lungs every 6 (six) hours as needed for wheezing or shortness of breath. 18 g 0   aspirin 81 MG chewable tablet Chew 1 tablet (81 mg total) by mouth daily.     atorvastatin (LIPITOR) 80 MG tablet Take 1 tablet (80 mg total) by mouth daily at 6 PM. 90 tablet 3   Budeson-Glycopyrrol-Formoterol (BREZTRI AEROSPHERE) 160-9-4.8 MCG/ACT AERO Inhale 2 puffs into the lungs in the morning and at bedtime. 5.9 g 0   clopidogrel (PLAVIX) 75 MG tablet TAKE 1 TABLET BY MOUTH EVERY DAY 90 tablet 3   diclofenac Sodium (VOLTAREN) 1 % GEL Apply 1 Application topically 3 (three) times daily as needed (pain).     empagliflozin (JARDIANCE) 10 MG TABS tablet Take 10 mg by mouth daily.     fluticasone (FLONASE) 50 MCG/ACT nasal spray SPRAY 1 SPRAY INTO BOTH NOSTRILS DAILY 16 g 2   furosemide (LASIX) 20 MG tablet Take 1 tablet (20 mg total) by mouth daily. 90 tablet 3   gabapentin (NEURONTIN) 100 MG capsule TAKE 1 CAPSULE BY MOUTH 3 TIMES DAILY (Patient taking differently: Take 100 mg by mouth at bedtime.) 90 capsule 3   glipiZIDE (GLUCOTROL) 10 MG tablet Take 1 tablet (10 mg total) by mouth 2 (two) times daily before a meal. 180 tablet 3   isosorbide mononitrate (IMDUR) 30 MG 24 hr tablet TAKE 1 TABLET BY MOUTH EVERY DAY IN THE EVENING 90 tablet 3   metFORMIN (GLUCOPHAGE-XR)  500 MG 24 hr tablet TAKE 2 TABLETS BY MOUTH EVERY DAY WITH BREAKFAST 180 tablet 3   metoprolol tartrate (LOPRESSOR) 25 MG tablet TAKE 1 TABLET BY MOUTH 2 TIMES A DAY 180 tablet 3   Multiple Vitamin (MULTIVITAMIN WITH MINERALS) TABS tablet Take 1 tablet by mouth daily.     nitroGLYCERIN (NITROSTAT) 0.4 MG SL tablet Place 0.4 mg under the tongue every 5 (five) minutes as needed for chest pain.     pantoprazole (PROTONIX) 40 MG tablet Take 1 tablet (40 mg total) by mouth 2 (two) times daily. 180 tablet 3   sacubitril-valsartan (ENTRESTO) 97-103 MG Take 1 tablet by mouth 2 (two) times daily. 180 tablet 3   vitamin B-12 (CYANOCOBALAMIN) 1000 MCG tablet Take 1,000 mcg by mouth daily.     No current facility-administered medications on file prior to visit.    Allergies  Allergen Reactions   Spironolactone Other (See Comments)    hyperkalemia    Past Medical History:  Diagnosis Date   Arthritis    Automatic implantable cardioverter-defibrillator in situ    COPD (chronic obstructive pulmonary disease) (HCC)    emphysema by CXR   Coronary artery disease    Diabetes mellitus    type 2  no meds   Diverticulosis of colon    GERD (gastroesophageal reflux disease)  with esophagitis   History of colonic polyps    Hypertension    Non-ischemic cardiomyopathy ([place])    Presence of permanent cardiac pacemaker    PVD (peripheral vascular disease) ([place])    bilateral common iliac artery aneurysms. Right SFA occlusion over a long segment. Left SFA disease with occlusion of the left TP trunk. Artirogram Oct. 2006   Sleep apnea    Tobacco abuse    Urinary incontinence    detrussor instability    Past Surgical History:  Procedure Laterality Date   ABDOMINAL AORTIC ENDOVASCULAR STENT GRAFT N/A 05/07/2018   Procedure: ABDOMINAL AORTIC ENDOVASCULAR STENT GRAFT;  Surgeon: Conrad [place], MD;  Location: [place];  Service: Vascular;  Laterality: N/A;   CARDIAC CATHETERIZATION  06/2004   negative   CARDIAC  CATHETERIZATION  02/08/2020   CLIPPING OF ATRIAL APPENDAGE N/A 02/11/2020   Procedure: Clipping Of Atrial Appendage using AtriCure 35 MM AtriClip.;  Surgeon: Wonda Olds, MD;  [facility];  Service: Open Heart Surgery;  Laterality: N/A;   COLONOSCOPY  04/2005   COPD exacerbation     CORONARY ANGIOPLASTY  01/09/2018   CORONARY ARTERY BYPASS GRAFT N/A 02/11/2020   Procedure: CORONARY ARTERY BYPASS GRAFTING (CABG) using LIMA to LAD; RIMA to PL; Endoscopic right greater saphenous vein harvest: SVC to Diag; SVC to OM1.;  Surgeon: Wonda Olds, MD;  Location: [facility];  Service: Open Heart Surgery;  Laterality: N/A;  BILATERAL IMA   CORONARY STENT INTERVENTION N/A 01/10/2018   Procedure: CORONARY STENT INTERVENTION;  Surgeon: Lorretta Harp, MD;  Location: [place] [facility];  Service: Cardiovascular;  Laterality: N/A;   CORONARY/GRAFT ACUTE MI REVASCULARIZATION N/A 01/10/2018   Procedure: Coronary/Graft Acute MI Revascularization;  Surgeon: Lorretta Harp, MD;  Location: [place] [facility];  Service: Cardiovascular;  Laterality: N/A;   EMBOLIZATION Left 03/19/2018   EMBOLIZATION Left 03/19/2018   Procedure: EMBOLIZATION - Left Internal;  Surgeon: Conrad Rosedale, MD;  Location: [place] [facility];  Service: Cardiovascular;  Laterality: Left;   EMBOLIZATION Right 04/16/2018   Procedure: EMBOLIZATION;  Surgeon: Conrad Perkasie, MD;  Location: [place] [facility];  Service: Cardiovascular;  Laterality: Right;   ENDOVEIN HARVEST OF GREATER SAPHENOUS VEIN Right 02/11/2020   Procedure: [place] Of Greater Saphenous Vein using right lower extremity.;  Surgeon: Wonda Olds, MD;  Location: [facility] OR;  Service: Open Heart Surgery;  Laterality: Right;   FEMORAL ARTERY EXPLORATION Right 05/07/2018   Procedure: FEMORAL ARTERY EXPLORATION, EXTENDED PROFUNDAPLASTY;  Surgeon: Conrad Vail, MD;  Location: [place];  Service: Vascular;  Laterality: Right;   INTRAOPERATIVE ARTERIOGRAM Right 05/07/2018    Procedure: INTRA OPERATIVE ARTERIOGRAM;  Surgeon: Conrad Tracy, MD;  Location: [place];  Service: Vascular;  Laterality: Right;   LEFT HEART CATH AND CORONARY ANGIOGRAPHY N/A 01/10/2018   Procedure: LEFT HEART CATH AND CORONARY ANGIOGRAPHY;  Surgeon: Lorretta Harp, MD;  Location: [place] [facility];  Service: Cardiovascular;  Laterality: N/A;   LEFT HEART CATH AND CORONARY ANGIOGRAPHY N/A 02/08/2020   Procedure: LEFT HEART CATH AND CORONARY ANGIOGRAPHY;  Surgeon: Troy Sine, MD;  Location: [place] [facility];  Service: Cardiovascular;  Laterality: N/A;   PACEMAKER INSERTION  11/2005   PACEMAKER LEAD REMOVAL N/A 10/17/2014   Procedure: PACEMAKER LEAD REMOVAL;  Surgeon: Evans Lance, MD;  Location: [place];  Service: Cardiovascular;  Laterality: N/A;   RENAL ANGIOGRAPHY Left 04/16/2018   Procedure: RENAL ANGIOGRAPHY;  Surgeon:  Conrad Palenville, MD;  Location: [place] [facility];  Service: Cardiovascular;  Laterality: Left;   RIGHT/LEFT HEART CATH AND CORONARY/GRAFT ANGIOGRAPHY N/A 07/31/2022   Procedure: RIGHT/LEFT HEART CATH AND CORONARY/GRAFT ANGIOGRAPHY;  Surgeon: Burnell Blanks, MD;  Location: [place] [facility];  Service: Cardiovascular;  Laterality: N/A;   s/p ICD placement      Medtronic Maximo [phone] single chamber   TEE WITHOUT CARDIOVERSION N/A 02/11/2020   Procedure: TRANSESOPHAGEAL ECHOCARDIOGRAM (TEE);  Surgeon: Wonda Olds, MD;  Location: [place];  Service: Open Heart Surgery;  Laterality: N/A;    Family History  Problem Relation Age of Onset   Stroke Father    Coronary artery disease Maternal Aunt    Heart failure Maternal Aunt    Lung cancer Maternal Aunt    Lung cancer Maternal Uncle    Cancer Brother        Mouth   Cancer Sister        throat    Social History   Socioeconomic History   Marital status: Married    Spouse name: Not on file   Number of children: 2   Years of education: Not on file   Highest education level: Not on file  Occupational  History   Occupation: Grave digger--now disabled  Tobacco Use   Smoking status: Former    Packs/day: 2.50    Years: 40.00    Total pack years: 100.00    Types: Cigarettes    Quit date: 01/09/2018    Years since quitting: 4.8    Passive exposure: Never   Smokeless tobacco: Never   Tobacco comments:    GAVE 1-800-QUIT-NOW  Vaping Use   Vaping Use: Never used  Substance and Sexual Activity   Alcohol use: No   Drug use: No   Sexual activity: Not Currently  Other Topics Concern   Not on file  Social History Narrative   No living will   Requests wife as health care POA--alternate is daughter or son   Would accept resuscitation but doesn't want prolonged ventilation   No tube feeds if cognitively unaware   Social Determinants of Health   Financial Resource Strain: Not on file  Food Insecurity: Not on file  Transportation Needs: Not on file  Physical Activity: Not on file  Stress: Not on file  Social Connections: Not on file  Intimate Partner Violence: Not on file   Review of Systems Weight fluctuates but no major change Sleep continues to be poor     Objective:   Physical Exam Constitutional:      Appearance: Normal appearance.  Cardiovascular:     Rate and Rhythm: Normal rate and regular rhythm.     Heart sounds: No murmur heard.    No gallop.     Comments: Feet warm but no palpable pulses Pulmonary:     Effort: Pulmonary effort is normal.     Breath sounds: Normal breath sounds. No wheezing or rales.  Musculoskeletal:     Cervical back: Neck supple.     Right lower leg: No edema.     Left lower leg: No edema.     Comments: Tenderness bursa and joint in right shoulder Some decreased rotation (both ways)  Lymphadenopathy:     Cervical: No cervical adenopathy.  Neurological:     Mental Status: He is alert.  Psychiatric:        Mood and Affect: Mood normal.        Behavior: Behavior normal.  Assessment & Plan:

## 2022-11-19 NOTE — Assessment & Plan Note (Signed)
Lab Results  Component Value Date   HGBA1C 9.1 (A) 11/19/2022   Serious decline---mostly from dietary indiscretion Discussed need to improve his eating Will try to increase jardiance to '25mg'$  Metformin 1000 bid, glipizide10 bid

## 2022-11-19 NOTE — Patient Instructions (Signed)
Please remember to take the pantoprazole on an empty stomach (bedtime might be best).

## 2022-11-20 NOTE — Telephone Encounter (Signed)
Prescription has been updated with [facility] for Jardiance 25 mg #90 with 1 refill.  Charlene Brooke, CPP notified  Marijean Niemann, [place] Clinical Pharmacy Assistant [phone]

## 2022-11-25 ENCOUNTER — Encounter: Payer: Self-pay | Admitting: *Deleted

## 2022-12-11 ENCOUNTER — Other Ambulatory Visit: Payer: Self-pay | Admitting: Vascular Surgery

## 2022-12-28 NOTE — Progress Notes (Unsigned)
Cardiology Office Note   Date:  12/30/2022   ID:  [name], [place] [birth year], MRN [id number]  PCP:  Venia Carbon, MD  Cardiologist:   Minus Breeding, MD   Chief Complaint  Patient presents with   Chest Pain      History of Present Illness: [name] is a 67 y.o. male who presents for follow up of CAD.  He has anatomy as below.  He has had continued chest pain and wanted medical management.  He underwent CABG x4 (LIMA-LAD; RIMA-PL; SVG-Diag; SVG-OM1) in February 2021.  He had more pain in August 2023. R/LHC on 07/31/2022 revealed severe triple-vessel CAD s/p CABG x4, severe mid LAD stenosis (mid and distal LAD filling from patent LIMA graft), severe diagonal stenosis with patent SVG-diagonal, moderate ostial circumflex stenosis with patent mid circumflex stent, occluded OM with patent vein graft to OM branch, CTO of P RCA unable to locate graft (free RIMA), no collateral filling of native RCA, graft presumed to be patent, normal R/L heart pressures.    He has seen pulmonary and is managed for sleep apnea.  They done pulmonary function testing on him.  I did go back and review vascular surgery notes.  He had an EVAR with bilateral hypogastric artery coil embolization in 2019.  He had right lower extremity acute limb ischemia requiring femoral endarterectomy with bovine pericardial patch.  This was by Dr. Bridgett Larsson.  CT earlier this year demonstrated smaller abdominal aortic aneurysm size and bilateral iliac aneurysm sizes with no endoleak.  He does have continued claudication but is thought to have to make comorbid illnesses to make further revascularization safe and this is being managed medically.  Given back problems and has peripheral vascular disease he gets around with a walker.  However, he has been feeling a little bit better and so he is actually doing a little bit more and is building some raised gardens.  His blood sugars not controlled.  His diet was not very good over the  holidays he admits.  He is not getting any new chest discomfort, neck or arm discomfort.  He still has chronic dyspnea with exertion but this is unchanged.  Is not describing PND or orthopnea.  Is not having any palpitations, presyncope or syncope.   Past Medical History:  Diagnosis Date   Arthritis    Automatic implantable cardioverter-defibrillator in situ    COPD (chronic obstructive pulmonary disease) (HCC)    emphysema by CXR   Coronary artery disease    Diabetes mellitus    type 2  no meds   Diverticulosis of colon    GERD (gastroesophageal reflux disease)    with esophagitis   History of colonic polyps    Hypertension    Non-ischemic cardiomyopathy ([place])    Presence of permanent cardiac pacemaker    PVD (peripheral vascular disease) ([place])    bilateral common iliac artery aneurysms. Right SFA occlusion over a long segment. Left SFA disease with occlusion of the left TP trunk. Artirogram Oct. 2006   Sleep apnea    Tobacco abuse    Urinary incontinence    detrussor instability    Past Surgical History:  Procedure Laterality Date   ABDOMINAL AORTIC ENDOVASCULAR STENT GRAFT N/A 05/07/2018   Procedure: ABDOMINAL AORTIC ENDOVASCULAR STENT GRAFT;  Surgeon: Conrad Ellsworth, MD;  Location: [facility] OR;  Service: Vascular;  Laterality: N/A;   CARDIAC CATHETERIZATION  06/2004   negative   CARDIAC CATHETERIZATION  02/08/2020  CLIPPING OF ATRIAL APPENDAGE N/A 02/11/2020   Procedure: Clipping Of Atrial Appendage using AtriCure 35 MM AtriClip.;  Surgeon: Wonda Olds, MD;  [facility];  Service: Open Heart Surgery;  Laterality: N/A;   COLONOSCOPY  04/2005   COPD exacerbation     CORONARY ANGIOPLASTY  01/09/2018   CORONARY ARTERY BYPASS GRAFT N/A 02/11/2020   Procedure: CORONARY ARTERY BYPASS GRAFTING (CABG) using LIMA to LAD; RIMA to PL; Endoscopic right greater saphenous vein harvest: SVC to Diag; SVC to OM1.;  Surgeon: Wonda Olds, MD;  Location: [place];  Service: Open Heart  Surgery;  Laterality: N/A;  BILATERAL IMA   CORONARY STENT INTERVENTION N/A 01/10/2018   Procedure: CORONARY STENT INTERVENTION;  Surgeon: Lorretta Harp, MD;  Location: [place] [facility];  Service: Cardiovascular;  Laterality: N/A;   CORONARY/GRAFT ACUTE MI REVASCULARIZATION N/A 01/10/2018   Procedure: Coronary/Graft Acute MI Revascularization;  Surgeon: Lorretta Harp, MD;  Location: [place] [facility];  Service: Cardiovascular;  Laterality: N/A;   EMBOLIZATION Left 03/19/2018   EMBOLIZATION Left 03/19/2018   Procedure: EMBOLIZATION - Left Internal;  Surgeon: Conrad Hilliard, MD;  Location: [place] [facility];  Service: Cardiovascular;  Laterality: Left;   EMBOLIZATION Right 04/16/2018   Procedure: EMBOLIZATION;  Surgeon: Conrad Eustis, MD;  Location: [place] [facility];  Service: Cardiovascular;  Laterality: Right;   ENDOVEIN HARVEST OF GREATER SAPHENOUS VEIN Right 02/11/2020   Procedure: [place] Of Greater Saphenous Vein using right lower extremity.;  Surgeon: Wonda Olds, MD;  Location: [facility] OR;  Service: Open Heart Surgery;  Laterality: Right;   FEMORAL ARTERY EXPLORATION Right 05/07/2018   Procedure: FEMORAL ARTERY EXPLORATION, EXTENDED PROFUNDAPLASTY;  Surgeon: Conrad Misenheimer, MD;  Location: [place];  Service: Vascular;  Laterality: Right;   INTRAOPERATIVE ARTERIOGRAM Right 05/07/2018   Procedure: INTRA OPERATIVE ARTERIOGRAM;  Surgeon: Conrad Woodland, MD;  Location: [place];  Service: Vascular;  Laterality: Right;   LEFT HEART CATH AND CORONARY ANGIOGRAPHY N/A 01/10/2018   Procedure: LEFT HEART CATH AND CORONARY ANGIOGRAPHY;  Surgeon: Lorretta Harp, MD;  Location: [place] [facility];  Service: Cardiovascular;  Laterality: N/A;   LEFT HEART CATH AND CORONARY ANGIOGRAPHY N/A 02/08/2020   Procedure: LEFT HEART CATH AND CORONARY ANGIOGRAPHY;  Surgeon: Troy Sine, MD;  Location: [place] [facility];  Service: Cardiovascular;  Laterality: N/A;   PACEMAKER INSERTION  11/2005    PACEMAKER LEAD REMOVAL N/A 10/17/2014   Procedure: PACEMAKER LEAD REMOVAL;  Surgeon: Evans Lance, MD;  Location: [place];  Service: Cardiovascular;  Laterality: N/A;   RENAL ANGIOGRAPHY Left 04/16/2018   Procedure: RENAL ANGIOGRAPHY;  Surgeon: Conrad [place], MD;  Location: [place] [facility];  Service: Cardiovascular;  Laterality: Left;   RIGHT/LEFT HEART CATH AND CORONARY/GRAFT ANGIOGRAPHY N/A 07/31/2022   Procedure: RIGHT/LEFT HEART CATH AND CORONARY/GRAFT ANGIOGRAPHY;  Surgeon: Burnell Blanks, MD;  Location: [place] [facility];  Service: Cardiovascular;  Laterality: N/A;   s/p ICD placement      Medtronic Maximo [phone] single chamber   TEE WITHOUT CARDIOVERSION N/A 02/11/2020   Procedure: TRANSESOPHAGEAL ECHOCARDIOGRAM (TEE);  Surgeon: Wonda Olds, MD;  Location: [place];  Service: Open Heart Surgery;  Laterality: N/A;     Current Outpatient Medications  Medication Sig Dispense Refill   albuterol (VENTOLIN HFA) 108 (90 Base) MCG/ACT inhaler Inhale 2 puffs into the lungs every 6 (six) hours as needed for wheezing or shortness of breath. 18 g  0   aspirin 81 MG chewable tablet Chew 1 tablet (81 mg total) by mouth daily.     atorvastatin (LIPITOR) 80 MG tablet Take 1 tablet (80 mg total) by mouth daily at 6 PM. 90 tablet 3   Budeson-Glycopyrrol-Formoterol (BREZTRI AEROSPHERE) 160-9-4.8 MCG/ACT AERO Inhale 2 puffs into the lungs in the morning and at bedtime. 5.9 g 0   clopidogrel (PLAVIX) 75 MG tablet TAKE 1 TABLET BY MOUTH EVERY DAY 90 tablet 3   diclofenac Sodium (VOLTAREN) 1 % GEL Apply 1 Application topically 3 (three) times daily as needed (pain).     empagliflozin (JARDIANCE) 25 MG TABS tablet Take 25 mg by mouth daily.     fluticasone (FLONASE) 50 MCG/ACT nasal spray SPRAY 1 SPRAY INTO BOTH NOSTRILS DAILY 16 g 2   furosemide (LASIX) 20 MG tablet Take 1 tablet (20 mg total) by mouth daily. 90 tablet 3   gabapentin (NEURONTIN) 100 MG capsule TAKE 1 CAPSULE BY MOUTH 3 TIMES DAILY  90 capsule 3   glipiZIDE (GLUCOTROL) 10 MG tablet Take 1 tablet (10 mg total) by mouth 2 (two) times daily before a meal. 180 tablet 3   isosorbide mononitrate (IMDUR) 30 MG 24 hr tablet TAKE 1 TABLET BY MOUTH EVERY DAY IN THE EVENING 90 tablet 3   metFORMIN (GLUCOPHAGE-XR) 500 MG 24 hr tablet TAKE 2 TABLETS BY MOUTH EVERY DAY WITH BREAKFAST 180 tablet 3   metoprolol tartrate (LOPRESSOR) 25 MG tablet TAKE 1 TABLET BY MOUTH 2 TIMES A DAY 180 tablet 3   Multiple Vitamin (MULTIVITAMIN WITH MINERALS) TABS tablet Take 1 tablet by mouth daily.     nitroGLYCERIN (NITROSTAT) 0.4 MG SL tablet Place 0.4 mg under the tongue every 5 (five) minutes as needed for chest pain.     pantoprazole (PROTONIX) 40 MG tablet Take 1 tablet (40 mg total) by mouth 2 (two) times daily. 180 tablet 3   sacubitril-valsartan (ENTRESTO) 97-103 MG Take 1 tablet by mouth 2 (two) times daily. 180 tablet 3   vitamin B-12 (CYANOCOBALAMIN) 1000 MCG tablet Take 1,000 mcg by mouth daily.     No current facility-administered medications for this visit.    Allergies:   Spironolactone   ROS:  Please see the history of present illness.   Otherwise, review of systems are positive for decreased memory , back and leg pain. .   All other systems are reviewed and negative.    PHYSICAL EXAM: VS:  BP 120/68   Pulse 65   Ht '5\' 10"'$  (1.778 m)   Wt 280 lb 6.4 oz (127.2 kg)   SpO2 98%   BMI 40.23 kg/m  , BMI Body mass index is 40.23 kg/m. GENERAL:  Well appearing NECK:  No jugular venous distention, waveform within normal limits, carotid upstroke brisk and symmetric, no bruits, no thyromegaly LUNGS:  Clear to auscultation bilaterally CHEST:  Well healed sternotomy scar. HEART:  PMI not displaced or sustained,S1 and S2 within normal limits, no S3, no S4, no clicks, no rubs, no murmurs ABD:  Flat, positive bowel sounds normal in frequency in pitch, no bruits, no rebound, no guarding, no midline pulsatile mass, no hepatomegaly, no  splenomegaly EXT:  2 plus pulses upper and diminished dorsalis pedis posttibial's bilateral, no edema, no cyanosis no clubbing  EKG:  EKG is not ordered today.   Recent Labs: 04/09/2022: ALT 22 07/29/2022: BUN 11; Creatinine, Ser 0.87; NT-Pro BNP 227; Platelets 293 07/31/2022: Hemoglobin 11.6; Potassium 3.7; Sodium 143    Lipid Panel  Component Value Date/Time   CHOL 113 05/09/2021 1030   CHOL 139 11/03/2020 0858   TRIG 308.0 (H) 05/09/2021 1030   TRIG 575 (HH) 01/02/2007 0000   HDL 36.30 (L) 05/09/2021 1030   HDL 37 (L) 11/03/2020 0858   CHOLHDL 3 05/09/2021 1030   VLDL 61.6 (H) 05/09/2021 1030   LDLCALC 51 11/03/2020 0858   LDLDIRECT 24.0 05/09/2021 1030      Wt Readings from Last 3 Encounters:  12/30/22 280 lb 6.4 oz (127.2 kg)  11/19/22 286 lb (129.7 kg)  08/13/22 283 lb 12.8 oz (128.7 kg)    Diagnostic8/08/2022  Dominance: Right  Intervention   Other studies Reviewed: Additional studies/ records that were reviewed today include: Pulmonary notes, vascular surgery notes Review of the above records demonstrates:  Please see elsewhere in the note.     ASSESSMENT AND PLAN:  S/P CABG x 4 The patient has no new sypmtoms.  No further cardiovascular testing is indicated.  We will continue with aggressive risk reduction and meds as listed.  Ischemic cardiomyopathy EF was 35 to 40% in 2021.  He has class I symptoms.  We will continue the meds as listed.  COPD (chronic obstructive pulmonary disease) with emphysema ([place]) This is followed by Dr. Halford Chessman.   Essential hypertension This is being managed in the context of treating his CHF    Type 2 diabetes mellitus with diabetic neuropathy (HCC) A1c was up to 9.1 from 8.1.  This is being managed by Venia Carbon, MD. we talked about diabetes managed.   OSA (obstructive sleep apnea) Uses CPAP.   PVD As above he is being managed medically.  MORBID OBESITY I gave him a specific instructions for weight loss goals and  diet to include lower carbohydrates.  He has had dietary indiscretions.  Current medicines are reviewed at length with the patient today.  The patient does not have concerns regarding medicines.  The following changes have been made:  As above  Labs/ tests ordered today include:   No orders of the defined types were placed in this encounter.    Disposition:   FU with APP in six month.    Signed, Minus Breeding, MD  12/30/2022 10:28 AM    [facility]

## 2022-12-30 ENCOUNTER — Ambulatory Visit: Payer: PPO | Attending: Cardiology | Admitting: Cardiology

## 2022-12-30 ENCOUNTER — Encounter: Payer: Self-pay | Admitting: Cardiology

## 2022-12-30 VITALS — BP 120/68 | HR 65 | Ht 70.0 in | Wt 280.4 lb

## 2022-12-30 DIAGNOSIS — J449 Chronic obstructive pulmonary disease, unspecified: Secondary | ICD-10-CM

## 2022-12-30 DIAGNOSIS — I10 Essential (primary) hypertension: Secondary | ICD-10-CM

## 2022-12-30 DIAGNOSIS — I2510 Atherosclerotic heart disease of native coronary artery without angina pectoris: Secondary | ICD-10-CM | POA: Diagnosis not present

## 2022-12-30 DIAGNOSIS — I255 Ischemic cardiomyopathy: Secondary | ICD-10-CM | POA: Diagnosis not present

## 2022-12-30 NOTE — Patient Instructions (Signed)
    Follow-Up: At [facility], you and your health needs are our priority.  As part of our continuing mission to provide you with exceptional heart care, we have created designated Provider Care Teams.  These Care Teams include your primary Cardiologist (physician) and Advanced Practice Providers (APPs -  Physician Assistants and Nurse Practitioners) who all work together to provide you with the care you need, when you need it.  We recommend signing up for the patient portal called "MyChart".  Sign up information is provided on this After Visit Summary.  MyChart is used to connect with patients for Virtual Visits (Telemedicine).  Patients are able to view lab/test results, encounter notes, upcoming appointments, etc.  Non-urgent messages can be sent to your provider as well.   To learn more about what you can do with MyChart, go to [email or web address].    Your next appointment:   6 month(s)  The format for your next appointment:   In Person  Provider:   Diona Browner NP

## 2023-01-13 ENCOUNTER — Other Ambulatory Visit: Payer: Self-pay | Admitting: Internal Medicine

## 2023-01-13 ENCOUNTER — Other Ambulatory Visit: Payer: Self-pay | Admitting: Cardiology

## 2023-01-23 ENCOUNTER — Other Ambulatory Visit: Payer: Self-pay | Admitting: Internal Medicine

## 2023-02-19 ENCOUNTER — Ambulatory Visit (INDEPENDENT_AMBULATORY_CARE_PROVIDER_SITE_OTHER): Payer: HMO | Admitting: Internal Medicine

## 2023-02-19 ENCOUNTER — Encounter: Payer: Self-pay | Admitting: Internal Medicine

## 2023-02-19 VITALS — BP 112/66 | HR 64 | Temp 97.4°F | Ht 70.0 in | Wt 280.0 lb

## 2023-02-19 DIAGNOSIS — I25119 Atherosclerotic heart disease of native coronary artery with unspecified angina pectoris: Secondary | ICD-10-CM

## 2023-02-19 DIAGNOSIS — J439 Emphysema, unspecified: Secondary | ICD-10-CM | POA: Diagnosis not present

## 2023-02-19 DIAGNOSIS — G894 Chronic pain syndrome: Secondary | ICD-10-CM

## 2023-02-19 DIAGNOSIS — E1151 Type 2 diabetes mellitus with diabetic peripheral angiopathy without gangrene: Secondary | ICD-10-CM | POA: Diagnosis not present

## 2023-02-19 DIAGNOSIS — E1140 Type 2 diabetes mellitus with diabetic neuropathy, unspecified: Secondary | ICD-10-CM | POA: Diagnosis not present

## 2023-02-19 LAB — POCT GLYCOSYLATED HEMOGLOBIN (HGB A1C): Hemoglobin A1C: 8.1 % — AB (ref 4.0–5.6)

## 2023-02-19 NOTE — Patient Instructions (Signed)
Please limit the tylenol to 3 of the '325mg'$  three times a day Try over the counter 4% lidocaine patch on the painful area of your back (on in AM and off in the evening)

## 2023-02-19 NOTE — Assessment & Plan Note (Signed)
Back is worse now Doesn't seem radicular Discussed proper tylenol dosing Add lidocaine patch for the daytime Using topical diclofenac

## 2023-02-19 NOTE — Assessment & Plan Note (Signed)
Stable DOE Continues on ASA 81, lipitor 80, isosorbide 30, metoprolol 25 bid

## 2023-02-19 NOTE — Assessment & Plan Note (Signed)
Stable DOE on breztri

## 2023-02-19 NOTE — Assessment & Plan Note (Signed)
Lab Results  Component Value Date   HGBA1C 8.1 (A) 02/19/2023   Control still not great--but much better Continue jardiance 25, glipizide 10 bid, metformin 1000 daily

## 2023-02-19 NOTE — Progress Notes (Signed)
Subjective:    Patient ID: [name], male    DOB: 28-Apr-1956, 67 y.o.   MRN: [id number]  HPI Here for follow up of poorly controlled diabetes, heart failure and pain issues  Has had increasing back pain --mostly lower right lumbar area About 2 weeks ago--awoke and could barely get out of the bed Taking tylenol--- will take 5 at at times and it helps briefly Not doing much now due to pain Better if lying flat--worse with sitting or moving around Hard to even straighten up Doesn't remember any injury Legs feel weak--but no change from his usual Has been to back and leg doctors in the past--did have 6 epidural injections (no help) Hasn't tried topical Rx--heat, etc. Has tried voltaren--minimal help Walks with the rollator  Not checking sugars Has improved eating to some degree--not cheating as much  No chest pain Breathing gets short with any activity--does use the breztri (and prn albuterol) Some cough--with phlegm  Current Outpatient Medications on File Prior to Visit  Medication Sig Dispense Refill   albuterol (VENTOLIN HFA) 108 (90 Base) MCG/ACT inhaler Inhale 2 puffs into the lungs every 6 (six) hours as needed for wheezing or shortness of breath. 18 g 0   aspirin 81 MG chewable tablet Chew 1 tablet (81 mg total) by mouth daily.     atorvastatin (LIPITOR) 80 MG tablet TAKE 1 TABLET BY MOUTH ONCE A DAY AT SIXIN THE EVENING 90 tablet 3   Budeson-Glycopyrrol-Formoterol (BREZTRI AEROSPHERE) 160-9-4.8 MCG/ACT AERO Inhale 2 puffs into the lungs in the morning and at bedtime. 5.9 g 0   clopidogrel (PLAVIX) 75 MG tablet TAKE 1 TABLET BY MOUTH EVERY DAY 90 tablet 3   diclofenac Sodium (VOLTAREN) 1 % GEL Apply 1 Application topically 3 (three) times daily as needed (pain).     empagliflozin (JARDIANCE) 25 MG TABS tablet Take 25 mg by mouth daily.     fluticasone (FLONASE) 50 MCG/ACT nasal spray SPRAY 1 SPRAY INTO BOTH NOSTRILS DAILY 16 g 2   furosemide (LASIX) 20 MG tablet Take 1  tablet (20 mg total) by mouth daily. 90 tablet 3   gabapentin (NEURONTIN) 100 MG capsule TAKE 1 CAPSULE BY MOUTH 3 TIMES DAILY 90 capsule 3   glipiZIDE (GLUCOTROL) 10 MG tablet Take 1 tablet (10 mg total) by mouth 2 (two) times daily before a meal. 180 tablet 3   isosorbide mononitrate (IMDUR) 30 MG 24 hr tablet TAKE 1 TABLET BY MOUTH EVERY DAY IN THE EVENING 90 tablet 3   metFORMIN (GLUCOPHAGE-XR) 500 MG 24 hr tablet TAKE 2 TABLETS BY MOUTH EVERY DAY WITH BREAKFAST 180 tablet 3   metoprolol tartrate (LOPRESSOR) 25 MG tablet TAKE 1 TABLET BY MOUTH 2 TIMES A DAY 180 tablet 3   Multiple Vitamin (MULTIVITAMIN WITH MINERALS) TABS tablet Take 1 tablet by mouth daily.     nitroGLYCERIN (NITROSTAT) 0.4 MG SL tablet Place 0.4 mg under the tongue every 5 (five) minutes as needed for chest pain.     pantoprazole (PROTONIX) 40 MG tablet Take 1 tablet (40 mg total) by mouth 2 (two) times daily. 180 tablet 3   sacubitril-valsartan (ENTRESTO) 97-103 MG Take 1 tablet by mouth 2 (two) times daily. 180 tablet 3   vitamin B-12 (CYANOCOBALAMIN) 1000 MCG tablet Take 1,000 mcg by mouth daily.     No current facility-administered medications on file prior to visit.    Allergies  Allergen Reactions   Spironolactone Other (See Comments)    hyperkalemia  Past Medical History:  Diagnosis Date   Arthritis    Automatic implantable cardioverter-defibrillator in situ    COPD (chronic obstructive pulmonary disease) (HCC)    emphysema by CXR   Coronary artery disease    Diabetes mellitus    type 2  no meds   Diverticulosis of colon    GERD (gastroesophageal reflux disease)    with esophagitis   History of colonic polyps    Hypertension    Non-ischemic cardiomyopathy ([place])    Presence of permanent cardiac pacemaker    PVD (peripheral vascular disease) ([place])    bilateral common iliac artery aneurysms. Right SFA occlusion over a long segment. Left SFA disease with occlusion of the left TP trunk. Artirogram Oct.  2006   Sleep apnea    Tobacco abuse    Urinary incontinence    detrussor instability    Past Surgical History:  Procedure Laterality Date   ABDOMINAL AORTIC ENDOVASCULAR STENT GRAFT N/A 05/07/2018   Procedure: ABDOMINAL AORTIC ENDOVASCULAR STENT GRAFT;  Surgeon: Conrad Tremont, MD;  Location: [place];  Service: Vascular;  Laterality: N/A;   CARDIAC CATHETERIZATION  06/2004   negative   CARDIAC CATHETERIZATION  02/08/2020   CLIPPING OF ATRIAL APPENDAGE N/A 02/11/2020   Procedure: Clipping Of Atrial Appendage using AtriCure 35 MM AtriClip.;  Surgeon: Wonda Olds, MD;  [facility];  Service: Open Heart Surgery;  Laterality: N/A;   COLONOSCOPY  04/2005   COPD exacerbation     CORONARY ANGIOPLASTY  01/09/2018   CORONARY ARTERY BYPASS GRAFT N/A 02/11/2020   Procedure: CORONARY ARTERY BYPASS GRAFTING (CABG) using LIMA to LAD; RIMA to PL; Endoscopic right greater saphenous vein harvest: SVC to Diag; SVC to OM1.;  Surgeon: Wonda Olds, MD;  Location: [place];  Service: Open Heart Surgery;  Laterality: N/A;  BILATERAL IMA   CORONARY STENT INTERVENTION N/A 01/10/2018   Procedure: CORONARY STENT INTERVENTION;  Surgeon: Lorretta Harp, MD;  Location: [place] [facility];  Service: Cardiovascular;  Laterality: N/A;   CORONARY/GRAFT ACUTE MI REVASCULARIZATION N/A 01/10/2018   Procedure: Coronary/Graft Acute MI Revascularization;  Surgeon: Lorretta Harp, MD;  Location: [place] [facility];  Service: Cardiovascular;  Laterality: N/A;   EMBOLIZATION Left 03/19/2018   EMBOLIZATION Left 03/19/2018   Procedure: EMBOLIZATION - Left Internal;  Surgeon: Conrad Loughman, MD;  Location: [place] [facility];  Service: Cardiovascular;  Laterality: Left;   EMBOLIZATION Right 04/16/2018   Procedure: EMBOLIZATION;  Surgeon: Conrad Bl[place]ville, MD;  Location: [place] [facility];  Service: Cardiovascular;  Laterality: Right;   ENDOVEIN HARVEST OF GREATER SAPHENOUS VEIN Right 02/11/2020   Procedure: [place] Of  Greater Saphenous Vein using right lower extremity.;  Surgeon: Wonda Olds, MD;  Location: [facility] OR;  Service: Open Heart Surgery;  Laterality: Right;   FEMORAL ARTERY EXPLORATION Right 05/07/2018   Procedure: FEMORAL ARTERY EXPLORATION, EXTENDED PROFUNDAPLASTY;  Surgeon: Conrad Barada, MD;  Location: [place];  Service: Vascular;  Laterality: Right;   INTRAOPERATIVE ARTERIOGRAM Right 05/07/2018   Procedure: INTRA OPERATIVE ARTERIOGRAM;  Surgeon: Conrad [place], MD;  Location: [place];  Service: Vascular;  Laterality: Right;   LEFT HEART CATH AND CORONARY ANGIOGRAPHY N/A 01/10/2018   Procedure: LEFT HEART CATH AND CORONARY ANGIOGRAPHY;  Surgeon: Lorretta Harp, MD;  Location: [place] [facility];  Service: Cardiovascular;  Laterality: N/A;   LEFT HEART CATH AND CORONARY ANGIOGRAPHY N/A 02/08/2020   Procedure: LEFT HEART CATH AND CORONARY ANGIOGRAPHY;  Surgeon: Troy Sine, MD;  Location: [place] [facility];  Service: Cardiovascular;  Laterality: N/A;   PACEMAKER INSERTION  11/2005   PACEMAKER LEAD REMOVAL N/A 10/17/2014   Procedure: PACEMAKER LEAD REMOVAL;  Surgeon: Evans Lance, MD;  Location: [place];  Service: Cardiovascular;  Laterality: N/A;   RENAL ANGIOGRAPHY Left 04/16/2018   Procedure: RENAL ANGIOGRAPHY;  Surgeon: Conrad Lisbon, MD;  Location: [place] [facility];  Service: Cardiovascular;  Laterality: Left;   RIGHT/LEFT HEART CATH AND CORONARY/GRAFT ANGIOGRAPHY N/A 07/31/2022   Procedure: RIGHT/LEFT HEART CATH AND CORONARY/GRAFT ANGIOGRAPHY;  Surgeon: Burnell Blanks, MD;  Location: [place] [facility];  Service: Cardiovascular;  Laterality: N/A;   s/p ICD placement      Medtronic Maximo [phone] single chamber   TEE WITHOUT CARDIOVERSION N/A 02/11/2020   Procedure: TRANSESOPHAGEAL ECHOCARDIOGRAM (TEE);  Surgeon: Wonda Olds, MD;  Location: [place];  Service: Open Heart Surgery;  Laterality: N/A;    Family History  Problem Relation Age of Onset   Stroke Father    Coronary artery  disease Maternal Aunt    Heart failure Maternal Aunt    Lung cancer Maternal Aunt    Lung cancer Maternal Uncle    Cancer Brother        Mouth   Cancer Sister        throat    Social History   Socioeconomic History   Marital status: Married    Spouse name: Not on file   Number of children: 2   Years of education: Not on file   Highest education level: Not on file  Occupational History   Occupation: Grave digger--now disabled  Tobacco Use   Smoking status: Former    Packs/day: 2.50    Years: 40.00    Total pack years: 100.00    Types: Cigarettes    Quit date: 01/09/2018    Years since quitting: 5.1    Passive exposure: Never   Smokeless tobacco: Never   Tobacco comments:    GAVE 1-800-QUIT-NOW  Vaping Use   Vaping Use: Never used  Substance and Sexual Activity   Alcohol use: No   Drug use: No   Sexual activity: Not Currently  Other Topics Concern   Not on file  Social History Narrative   No living will   Requests wife as health care POA--alternate is daughter or son   Would accept resuscitation but doesn't want prolonged ventilation   No tube feeds if cognitively unaware   Social Determinants of Health   Financial Resource Strain: Not on file  Food Insecurity: Not on file  Transportation Needs: Not on file  Physical Activity: Not on file  Stress: Not on file  Social Connections: Not on file  Intimate Partner Violence: Not on file   Review of Systems Has lost a few pounds Still doesn't sleep well--frequent awakening    Objective:   Physical Exam Constitutional:      Appearance: Normal appearance.  Cardiovascular:     Rate and Rhythm: Normal rate and regular rhythm.     Heart sounds: No murmur heard.    No gallop.     Comments: Feet warm but no pulses Pulmonary:     Effort: Pulmonary effort is normal.     Breath sounds: Normal breath sounds. No wheezing or rales.  Abdominal:     Palpations: Abdomen is soft.     Tenderness: There is no abdominal  tenderness.  Musculoskeletal:     Right lower  leg: No edema.     Left lower leg: No edema.     Comments: No spine tenderness Focal right lateral lumbar tenderness  Neurological:     Mental Status: He is alert.     Comments: Some right leg weakness            Assessment & Plan:

## 2023-03-11 ENCOUNTER — Telehealth: Payer: Self-pay | Admitting: *Deleted

## 2023-03-11 NOTE — Telephone Encounter (Signed)
Jardiance patient assistance faxed to [employer]. 

## 2023-03-17 ENCOUNTER — Encounter: Payer: Self-pay | Admitting: Cardiology

## 2023-03-20 ENCOUNTER — Other Ambulatory Visit: Payer: Self-pay

## 2023-03-20 MED ORDER — EMPAGLIFLOZIN 25 MG PO TABS: 25.0000 mg | ORAL_TABLET | Freq: Every day | ORAL | 3 refills | Status: DC

## 2023-03-21 NOTE — Telephone Encounter (Signed)
Called and spoke with [employer] changed faxed back.

## 2023-03-26 ENCOUNTER — Telehealth: Payer: Self-pay | Admitting: Cardiology

## 2023-03-26 NOTE — Telephone Encounter (Signed)
Called patient, advised they needed a verbal prescription for the Jardiance to be sent over.    Called patient assistance, and advised of verbal prescription.   Patient verbalized understanding.    Foundation given verbal, they verbalized understanding.

## 2023-03-26 NOTE — Telephone Encounter (Signed)
Wife is calling in to say that [place] Patient Assistance hasn't received the fax from our office. Wife is calling with a number for our office to called the prescription in. [phone]. Please advise

## 2023-04-25 ENCOUNTER — Other Ambulatory Visit: Payer: Self-pay | Admitting: Cardiology

## 2023-04-25 DIAGNOSIS — I5022 Chronic systolic (congestive) heart failure: Secondary | ICD-10-CM

## 2023-04-25 DIAGNOSIS — I200 Unstable angina: Secondary | ICD-10-CM

## 2023-04-25 DIAGNOSIS — E1140 Type 2 diabetes mellitus with diabetic neuropathy, unspecified: Secondary | ICD-10-CM

## 2023-04-25 DIAGNOSIS — Z951 Presence of aortocoronary bypass graft: Secondary | ICD-10-CM

## 2023-04-25 DIAGNOSIS — E785 Hyperlipidemia, unspecified: Secondary | ICD-10-CM

## 2023-04-25 DIAGNOSIS — I10 Essential (primary) hypertension: Secondary | ICD-10-CM

## 2023-04-25 DIAGNOSIS — I2510 Atherosclerotic heart disease of native coronary artery without angina pectoris: Secondary | ICD-10-CM

## 2023-05-21 ENCOUNTER — Ambulatory Visit (INDEPENDENT_AMBULATORY_CARE_PROVIDER_SITE_OTHER): Payer: HMO | Admitting: Internal Medicine

## 2023-05-21 ENCOUNTER — Encounter: Payer: Self-pay | Admitting: Internal Medicine

## 2023-05-21 VITALS — BP 118/72 | HR 66 | Temp 97.4°F | Ht 70.5 in | Wt 275.0 lb

## 2023-05-21 DIAGNOSIS — I25119 Atherosclerotic heart disease of native coronary artery with unspecified angina pectoris: Secondary | ICD-10-CM

## 2023-05-21 DIAGNOSIS — E1151 Type 2 diabetes mellitus with diabetic peripheral angiopathy without gangrene: Secondary | ICD-10-CM

## 2023-05-21 DIAGNOSIS — I480 Paroxysmal atrial fibrillation: Secondary | ICD-10-CM

## 2023-05-21 DIAGNOSIS — Z23 Encounter for immunization: Secondary | ICD-10-CM | POA: Diagnosis not present

## 2023-05-21 DIAGNOSIS — I739 Peripheral vascular disease, unspecified: Secondary | ICD-10-CM

## 2023-05-21 DIAGNOSIS — J439 Emphysema, unspecified: Secondary | ICD-10-CM | POA: Diagnosis not present

## 2023-05-21 DIAGNOSIS — Z0000 Encounter for general adult medical examination without abnormal findings: Secondary | ICD-10-CM

## 2023-05-21 DIAGNOSIS — I5022 Chronic systolic (congestive) heart failure: Secondary | ICD-10-CM | POA: Diagnosis not present

## 2023-05-21 LAB — LIPID PANEL
Cholesterol: 112 mg/dL (ref 0–200)
HDL: 33.1 mg/dL — ABNORMAL LOW (ref >39.00)
NonHDL: 79.38
Total CHOL/HDL Ratio: 3
Triglycerides: 352 mg/dL — ABNORMAL HIGH (ref 0.0–149.0)
VLDL: 70.4 mg/dL — ABNORMAL HIGH (ref 0.0–40.0)

## 2023-05-21 LAB — CBC
HCT: 41.6 % (ref 39.0–52.0)
Hemoglobin: 13.4 g/dL (ref 13.0–17.0)
MCHC: 32.3 g/dL (ref 30.0–36.0)
MCV: 69.5 fl — ABNORMAL LOW (ref 78.0–100.0)
Platelets: 343 10*3/uL (ref 150.0–400.0)
RBC: 5.98 Mil/uL — ABNORMAL HIGH (ref 4.22–5.81)
RDW: 20.3 % — ABNORMAL HIGH (ref 11.5–15.5)
WBC: 7.8 10*3/uL (ref 4.0–10.5)

## 2023-05-21 LAB — COMPREHENSIVE METABOLIC PANEL
ALT: 16 U/L (ref 0–53)
AST: 18 U/L (ref 0–37)
Albumin: 4.1 g/dL (ref 3.5–5.2)
Alkaline Phosphatase: 71 U/L (ref 39–117)
BUN: 23 mg/dL (ref 6–23)
CO2: 26 mEq/L (ref 19–32)
Calcium: 9.1 mg/dL (ref 8.4–10.5)
Chloride: 103 mEq/L (ref 96–112)
Creatinine, Ser: 1.14 mg/dL (ref 0.40–1.50)
GFR: 67.02 mL/min (ref >60.00)
Glucose, Bld: 157 mg/dL — ABNORMAL HIGH (ref 70–99)
Potassium: 4.5 mEq/L (ref 3.5–5.1)
Sodium: 137 mEq/L (ref 135–145)
Total Bilirubin: 0.7 mg/dL (ref 0.2–1.2)
Total Protein: 7.4 g/dL (ref 6.0–8.3)

## 2023-05-21 LAB — MICROALBUMIN / CREATININE URINE RATIO
Creatinine,U: 45.9 mg/dL
Microalb Creat Ratio: 3.2 mg/g (ref 0.0–30.0)
Microalb, Ur: 1.5 mg/dL (ref 0.0–1.9)

## 2023-05-21 LAB — HEMOGLOBIN A1C: Hgb A1c MFr Bld: 8.3 % — ABNORMAL HIGH (ref 4.6–6.5)

## 2023-05-21 LAB — LDL CHOLESTEROL, DIRECT: Direct LDL: 27 mg/dL

## 2023-05-21 LAB — HM DIABETES FOOT EXAM

## 2023-05-21 NOTE — Addendum Note (Signed)
Addended by: Eual Fines on: 05/21/2023 12:14 PM   Modules accepted: Orders

## 2023-05-21 NOTE — Assessment & Plan Note (Signed)
No evidence of recurrence--had post op Remains just on DAPT

## 2023-05-21 NOTE — Assessment & Plan Note (Signed)
No claudication since surgery

## 2023-05-21 NOTE — Progress Notes (Signed)
Subjective:    Patient ID: [name], male    DOB: [birth year], 67 y.o.   MRN: [id number]  HPI Here for Medicare wellness visit and follow up of chronic health conditions Reviewed advanced directives Reviewed other doctors--Dr Hochrein--cardiology, Dr Janalyn Rouse surgery,  Dr Wendelyn Breslow, Dr Collier Salina No hospitalizations or surgery this year Stays active with his garden Vision is fading some--but not bad Hearing aides help--at least the left No alcohol or tobacco  Several falls--various times. No injuries Chronic mood problems are better Does shop --wife does the housework No sig memory issues--just forgetful at times  Not checking sugars Tries to avoid sweets Works out in garden every day  Limited by aches and pain in joints--neck, back, fingers, etc Uses tylenol if bad and diclofenac topical  Heart okay No angina on isosorbide No palpitations Some dizziness--no syncope No edema Sleeps in hospital bed--doesn't sleep well despite the CPAP. No PND though Not weighing daily--but weight is down some. BMI still 38  Some cough Some wheezing --uses the inhalers (albuterol prn)---breztri regular Stable DOE---has to rest a lot due to back and leg pain  No regular depression Still frustrated by limitations--aggravated Doing better now when he can garden  Current Outpatient Medications on File Prior to Visit  Medication Sig Dispense Refill   albuterol (VENTOLIN HFA) 108 (90 Base) MCG/ACT inhaler Inhale 2 puffs into the lungs every 6 (six) hours as needed for wheezing or shortness of breath. 18 g 0   aspirin 81 MG chewable tablet Chew 1 tablet (81 mg total) by mouth daily.     atorvastatin (LIPITOR) 80 MG tablet TAKE 1 TABLET BY MOUTH ONCE A DAY AT SIXIN THE EVENING 90 tablet 3   Budeson-Glycopyrrol-Formoterol (BREZTRI AEROSPHERE) 160-9-4.8 MCG/ACT AERO Inhale 2 puffs into the lungs in the morning and at bedtime. 5.9 g 0   clopidogrel (PLAVIX) 75 MG tablet TAKE  1 TABLET BY MOUTH EVERY DAY 90 tablet 3   diclofenac Sodium (VOLTAREN) 1 % GEL Apply 1 Application topically 3 (three) times daily as needed (pain).     empagliflozin (JARDIANCE) 25 MG TABS tablet Take 1 tablet (25 mg total) by mouth daily. 90 tablet 3   fluticasone (FLONASE) 50 MCG/ACT nasal spray SPRAY 1 SPRAY INTO BOTH NOSTRILS DAILY 16 g 2   furosemide (LASIX) 20 MG tablet TAKE ONE TABLET BY MOUTH ONCE A DAY 90 tablet 3   gabapentin (NEURONTIN) 100 MG capsule TAKE 1 CAPSULE BY MOUTH 3 TIMES DAILY 90 capsule 3   glipiZIDE (GLUCOTROL) 10 MG tablet Take 1 tablet (10 mg total) by mouth 2 (two) times daily before a meal. 180 tablet 3   isosorbide mononitrate (IMDUR) 30 MG 24 hr tablet TAKE ONE TABLET BY MOUTH ONCE EVERY EVENING 90 tablet 3   metFORMIN (GLUCOPHAGE-XR) 500 MG 24 hr tablet TAKE 2 TABLETS BY MOUTH EVERY DAY WITH BREAKFAST 180 tablet 3   metoprolol tartrate (LOPRESSOR) 25 MG tablet TAKE 1 TABLET BY MOUTH 2 TIMES A DAY 180 tablet 3   Multiple Vitamin (MULTIVITAMIN WITH MINERALS) TABS tablet Take 1 tablet by mouth daily.     nitroGLYCERIN (NITROSTAT) 0.4 MG SL tablet Place 0.4 mg under the tongue every 5 (five) minutes as needed for chest pain.     pantoprazole (PROTONIX) 40 MG tablet Take 1 tablet (40 mg total) by mouth 2 (two) times daily. 180 tablet 3   sacubitril-valsartan (ENTRESTO) 97-103 MG Take 1 tablet by mouth 2 (two) times daily. 180 tablet 3  vitamin B-12 (CYANOCOBALAMIN) 1000 MCG tablet Take 1,000 mcg by mouth daily.     No current facility-administered medications on file prior to visit.    Allergies  Allergen Reactions   Spironolactone Other (See Comments)    hyperkalemia    Past Medical History:  Diagnosis Date   Arthritis    Automatic implantable cardioverter-defibrillator in situ    COPD (chronic obstructive pulmonary disease) (HCC)    emphysema by CXR   Coronary artery disease    Diabetes mellitus    type 2  no meds   Diverticulosis of colon    GERD  (gastroesophageal reflux disease)    with esophagitis   History of colonic polyps    Hypertension    Non-ischemic cardiomyopathy (HCC)    Presence of permanent cardiac pacemaker    PVD (peripheral vascular disease) (HCC)    bilateral common iliac artery aneurysms. Right SFA occlusion over a long segment. Left SFA disease with occlusion of the left TP trunk. Artirogram Oct. 2006   Sleep apnea    Tobacco abuse    Urinary incontinence    detrussor instability    Past Surgical History:  Procedure Laterality Date   ABDOMINAL AORTIC ENDOVASCULAR STENT GRAFT N/A 05/07/2018   Procedure: ABDOMINAL AORTIC ENDOVASCULAR STENT GRAFT;  Surgeon: Fransisco Hertz, MD;  Location: [facility];  Service: Vascular;  Laterality: N/A;   CARDIAC CATHETERIZATION  06/2004   negative   CARDIAC CATHETERIZATION  02/08/2020   CLIPPING OF ATRIAL APPENDAGE N/A 02/11/2020   Procedure: Clipping Of Atrial Appendage using AtriCure 35 MM AtriClip.;  Surgeon: Linden Dolin, MD;  [facility];  Service: Open Heart Surgery;  Laterality: N/A;   COLONOSCOPY  04/2005   COPD exacerbation     CORONARY ANGIOPLASTY  01/09/2018   CORONARY ARTERY BYPASS GRAFT N/A 02/11/2020   Procedure: CORONARY ARTERY BYPASS GRAFTING (CABG) using LIMA to LAD; RIMA to PL; Endoscopic right greater saphenous vein harvest: SVC to Diag; SVC to OM1.;  Surgeon: Linden Dolin, MD;  [facility];  Service: Open Heart Surgery;  Laterality: N/A;  BILATERAL IMA   CORONARY STENT INTERVENTION N/A 01/10/2018   Procedure: CORONARY STENT INTERVENTION;  Surgeon: Runell Gess, MD;  [facility];  Service: Cardiovascular;  Laterality: N/A;   CORONARY/GRAFT ACUTE MI REVASCULARIZATION N/A 01/10/2018   Procedure: Coronary/Graft Acute MI Revascularization;  Surgeon: Runell Gess, MD;  [facility];  Service: Cardiovascular;  Laterality: N/A;   EMBOLIZATION Left 03/19/2018   EMBOLIZATION Left 03/19/2018   Procedure: EMBOLIZATION  - Left Internal;  Surgeon: Fransisco Hertz, MD;  Location: [facility] IN[facility]IVE [facility];  Service: Cardiovascular;  Laterality: Left;   EMBOLIZATION Right 04/16/2018   Procedure: EMBOLIZATION;  Surgeon: Fransisco Hertz, MD;  Location: [facility] INV[facility];  Service: Cardiovascular;  Laterality: Right;   ENDOVEIN HARVEST OF GREATER SAPHENOUS VEIN Right 02/11/2020   Procedure: [place] Of Greater Saphenous Vein using right lower extremity.;  Surgeon: Linden Dolin, MD;  Location: [facility] OR;  Service: Open Heart Surgery;  Laterality: Right;   FEMORAL ARTERY EXPLORATION Right 05/07/2018   Procedure: FEMORAL ARTERY EXPLORATION, EXTENDED PROFUNDAPLASTY;  Surgeon: Fransisco Hertz, MD;  Location: [facility] OR;  Service: Vascular;  Laterality: Right;   INTRAOPERATIVE ARTERIOGRAM Right 05/07/2018   Procedure: INTRA OPERATIVE ARTERIOGRAM;  Surgeon: Fransisco Hertz, MD;  Location: [facility] OR;  Service: Vascular;  Laterality: Right;   LEFT HEART CATH AND CORONARY ANGIOGRAPHY N/A 01/10/2018  Procedure: LEFT HEART CATH AND CORONARY ANGIOGRAPHY;  Surgeon: Runell Gess, MD;  [facility];  Service: Cardiovascular;  Laterality: N/A;   LEFT HEART CATH AND CORONARY ANGIOGRAPHY N/A 02/08/2020   Procedure: LEFT HEART CATH AND CORONARY ANGIOGRAPHY;  Surgeon: Lennette Bihari, MD;  [facility];  Service: Cardiovascular;  Laterality: N/A;   PACEMAKER INSERTION  11/2005   PACEMAKER LEAD REMOVAL N/A 10/17/2014   Procedure: PACEMAKER LEAD REMOVAL;  Surgeon: Marinus Maw, MD;  Location: [facility] OR;  Service: Cardiovascular;  Laterality: N/A;   RENAL ANGIOGRAPHY Left 04/16/2018   Procedure: RENAL ANGIOGRAPHY;  Surgeon: Fransisco Hertz, MD;  Location: [facility] INVASIVE CV LAB;  Service: Cardiovascular;  Laterality: Left;   RIGHT/LEFT HEART CATH AND CORONARY/GRAFT ANGIOGRAPHY N/A 07/31/2022   Procedure: RIGHT/LEFT HEART CATH AND CORONARY/GRAFT ANGIOGRAPHY;  Surgeon: Kathleene Hazel, MD;  [facility];  Service:  Cardiovascular;  Laterality: N/A;   s/p ICD placement      Medtronic Maximo [phone] single chamber   TEE WITHOUT CARDIOVERSION N/A 02/11/2020   Procedure: TRANSESOPHAGEAL ECHOCARDIOGRAM (TEE);  Surgeon: Linden Dolin, MD;  Location: [facility] OR;  Service: Open Heart Surgery;  Laterality: N/A;    Family History  Problem Relation Age of Onset   Stroke Father    Coronary artery disease Maternal Aunt    Heart failure Maternal Aunt    Lung cancer Maternal Aunt    Lung cancer Maternal Uncle    Cancer Brother        Mouth   Cancer Sister        throat    Social History   Socioeconomic History   Marital status: Married    Spouse name: Not on file   Number of children: 2   Years of education: Not on file   Highest education level: Not on file  Occupational History   Occupation: Grave digger--now disabled  Tobacco Use   Smoking status: Former    Packs/day: 2.50    Years: 40.00    Additional pack years: 0.00    Total pack years: 100.00    Types: Cigarettes    Quit date: 01/09/2018    Years since quitting: 5.3    Passive exposure: Never   Smokeless tobacco: Never   Tobacco comments:    GAVE 1-800-QUIT-NOW  Vaping Use   Vaping Use: Never used  Substance and Sexual Activity   Alcohol use: No   Drug use: No   Sexual activity: Not Currently  Other Topics Concern   Not on file  Social History Narrative   No living will   Requests wife as health care POA--alternate is daughter or son   Would accept resuscitation but doesn't want prolonged ventilation   No tube feeds if cognitively unaware   Social Determinants of Health   Financial Resource Strain: Not on file  Food Insecurity: Not on file  Transportation Needs: Not on file  Physical Activity: Not on file  Stress: Not on file  Social Connections: Not on file  Intimate Partner Violence: Not on file   Review of Systems Appetite is okay Wears seat belt Edentulous --no dentist No suspicious skin lesions No heartburn --rare  dysphagia Bowels move fine--no blood Voids okay---some urgency and leakage     Objective:   Physical Exam Constitutional:      Appearance: Normal appearance.  HENT:     Mouth/Throat:     Pharynx: No oropharyngeal exudate or posterior oropharyngeal erythema.  Eyes:     Conjunctiva/sclera: Conjunctivae normal.     Pupils: Pupils are equal, round, and reactive to light.  Cardiovascular:     Rate and Rhythm: Normal rate and regular rhythm.     Heart sounds: No murmur heard.    No gallop.     Comments: Faint pedal pulses Pulmonary:     Effort: Pulmonary effort is normal.     Breath sounds: Normal breath sounds. No wheezing or rales.  Abdominal:     Palpations: Abdomen is soft.     Tenderness: There is no abdominal tenderness.  Musculoskeletal:     Cervical back: Neck supple.     Right lower leg: No edema.     Left lower leg: No edema.  Lymphadenopathy:     Cervical: No cervical adenopathy.  Skin:    Findings: No lesion or rash.  Neurological:     General: No focal deficit present.     Mental Status: He is alert and oriented to person, place, and time.     Comments: Word naming 9/1 minute Recall 2/3 Fairly normal sensation in feet  Psychiatric:        Mood and Affect: Mood normal.        Behavior: Behavior normal.            Assessment & Plan:

## 2023-05-21 NOTE — Progress Notes (Signed)
Hearing Screening - Comments:: Has hearing aids. Wearing them today. Vision Screening - Comments:: Going to Eye doctor tomorrow (05-22-23)

## 2023-05-21 NOTE — Assessment & Plan Note (Signed)
Compensated Weight down some---urged him to weigh daily Furosemide 20 daily, entresto 97/103 bid

## 2023-05-21 NOTE — Assessment & Plan Note (Signed)
No recent chest pain on the isosorbide 30---DAPT, atorvastatin80, metoprolol 25 bid and entresto

## 2023-05-21 NOTE — Assessment & Plan Note (Signed)
Stable DOE on the breztri bid

## 2023-05-21 NOTE — Assessment & Plan Note (Signed)
Hopefully still reasonable control--near 8% On jardiance 25, glipizide 10 bid, metfomrinn 1000 daily

## 2023-05-21 NOTE — Assessment & Plan Note (Signed)
I have personally reviewed the Medicare Annual Wellness questionnaire and have noted 1. The patient's medical and social history 2. Their use of alcohol, tobacco or illicit drugs 3. Their current medications and supplements 4. The patient's functional ability including ADL's, fall risks, home safety risks and hearing or visual             impairment. 5. Diet and physical activities 6. Evidence for depression or mood disorders  The patients weight, height, BMI and visual acuity have been recorded in the chart I have made referrals, counseling and provided education to the patient based review of the above and I have provided the pt with a written personalized care plan for preventive services.  I have provided you with a copy of your personalized plan for preventive services. Please take the time to review along with your updated medication list.  Still prefers no cancer screening Prevnar 20 today Prefers no COVID vaccines Flu/RSV in the fall Stays active in garden--as much as he can

## 2023-05-22 LAB — HM DIABETES EYE EXAM

## 2023-05-27 ENCOUNTER — Encounter: Payer: Self-pay | Admitting: Internal Medicine

## 2023-06-10 ENCOUNTER — Encounter: Payer: Self-pay | Admitting: Internal Medicine

## 2023-06-10 ENCOUNTER — Ambulatory Visit (INDEPENDENT_AMBULATORY_CARE_PROVIDER_SITE_OTHER): Payer: HMO | Admitting: Internal Medicine

## 2023-06-10 VITALS — BP 98/60 | HR 71 | Temp 97.5°F | Ht 70.0 in | Wt 275.0 lb

## 2023-06-10 DIAGNOSIS — S86911A Strain of unspecified muscle(s) and tendon(s) at lower leg level, right leg, initial encounter: Secondary | ICD-10-CM | POA: Insufficient documentation

## 2023-06-10 MED ORDER — TRAMADOL HCL 50 MG PO TABS: 50.0000 mg | ORAL_TABLET | Freq: Every evening | ORAL | 0 refills | Status: DC | PRN

## 2023-06-10 NOTE — Progress Notes (Signed)
Subjective:    Patient ID: [name], male    DOB: 1956/11/30, 67 y.o.   MRN: [id number]  HPI Here due to knee pain  Has had intermittent bilateral knee over the years Now having more pain--especially the right one Uses diclofenac topical--hasn't been helping lately Tylenol 2000mg  did help in the past---but not much anymore  Constant pain now Awakens him at night Hard to drive--has to switch legs and use left leg at times  Current Outpatient Medications on File Prior to Visit  Medication Sig Dispense Refill   albuterol (VENTOLIN HFA) 108 (90 Base) MCG/ACT inhaler Inhale 2 puffs into the lungs every 6 (six) hours as needed for wheezing or shortness of breath. 18 g 0   aspirin 81 MG chewable tablet Chew 1 tablet (81 mg total) by mouth daily.     atorvastatin (LIPITOR) 80 MG tablet TAKE 1 TABLET BY MOUTH ONCE A DAY AT SIXIN THE EVENING 90 tablet 3   Budeson-Glycopyrrol-Formoterol (BREZTRI AEROSPHERE) 160-9-4.8 MCG/ACT AERO Inhale 2 puffs into the lungs in the morning and at bedtime. 5.9 g 0   clopidogrel (PLAVIX) 75 MG tablet TAKE 1 TABLET BY MOUTH EVERY DAY 90 tablet 3   diclofenac Sodium (VOLTAREN) 1 % GEL Apply 1 Application topically 3 (three) times daily as needed (pain).     empagliflozin (JARDIANCE) 25 MG TABS tablet Take 1 tablet (25 mg total) by mouth daily. 90 tablet 3   fluticasone (FLONASE) 50 MCG/ACT nasal spray SPRAY 1 SPRAY INTO BOTH NOSTRILS DAILY 16 g 2   furosemide (LASIX) 20 MG tablet TAKE ONE TABLET BY MOUTH ONCE A DAY 90 tablet 3   gabapentin (NEURONTIN) 100 MG capsule TAKE 1 CAPSULE BY MOUTH 3 TIMES DAILY 90 capsule 3   glipiZIDE (GLUCOTROL) 10 MG tablet Take 1 tablet (10 mg total) by mouth 2 (two) times daily before a meal. 180 tablet 3   isosorbide mononitrate (IMDUR) 30 MG 24 hr tablet TAKE ONE TABLET BY MOUTH ONCE EVERY EVENING 90 tablet 3   metFORMIN (GLUCOPHAGE-XR) 500 MG 24 hr tablet TAKE 2 TABLETS BY MOUTH EVERY DAY WITH BREAKFAST 180 tablet 3    metoprolol tartrate (LOPRESSOR) 25 MG tablet TAKE 1 TABLET BY MOUTH 2 TIMES A DAY 180 tablet 3   Multiple Vitamin (MULTIVITAMIN WITH MINERALS) TABS tablet Take 1 tablet by mouth daily.     nitroGLYCERIN (NITROSTAT) 0.4 MG SL tablet Place 0.4 mg under the tongue every 5 (five) minutes as needed for chest pain.     pantoprazole (PROTONIX) 40 MG tablet Take 1 tablet (40 mg total) by mouth 2 (two) times daily. 180 tablet 3   sacubitril-valsartan (ENTRESTO) 97-103 MG Take 1 tablet by mouth 2 (two) times daily. 180 tablet 3   vitamin B-12 (CYANOCOBALAMIN) 1000 MCG tablet Take 1,000 mcg by mouth daily.     No current facility-administered medications on file prior to visit.    Allergies  Allergen Reactions   Spironolactone Other (See Comments)    hyperkalemia    Past Medical History:  Diagnosis Date   Arthritis    Automatic implantable cardioverter-defibrillator in situ    COPD (chronic obstructive pulmonary disease) (HCC)    emphysema by CXR   Coronary artery disease    Diabetes mellitus    type 2  no meds   Diverticulosis of colon    GERD (gastroesophageal reflux disease)    with esophagitis   History of colonic polyps    Hypertension    Non-ischemic cardiomyopathy (  HCC)    Presence of permanent cardiac pacemaker    PVD (peripheral vascular disease) (HCC)    bilateral common iliac artery aneurysms. Right SFA occlusion over a long segment. Left SFA disease with occlusion of the left TP trunk. Artirogram Oct. 2006   Sleep apnea    Tobacco abuse    Urinary incontinence    detrussor instability    Past Surgical History:  Procedure Laterality Date   ABDOMINAL AORTIC ENDOVASCULAR STENT GRAFT N/A 05/07/2018   Procedure: ABDOMINAL AORTIC ENDOVASCULAR STENT GRAFT;  Surgeon: Fransisco Hertz, MD;  [facility];  Service: Vascular;  Laterality: N/A;   CARDIAC CATHETERIZATION  06/2004   negative   CARDIAC CATHETERIZATION  02/08/2020   CLIPPING OF ATRIAL APPENDAGE N/A 02/11/2020    Procedure: Clipping Of Atrial Appendage using AtriCure 35 MM AtriClip.;  Surgeon: Linden Dolin, MD;  Location: [facility] OR;  Service: Open Heart Surgery;  Laterality: N/A;   COLONOSCOPY  04/2005   COPD exacerbation     CORONARY ANGIOPLASTY  01/09/2018   CORONARY ARTERY BYPASS GRAFT N/A 02/11/2020   Procedure: CORONARY ARTERY BYPASS GRAFTING (CABG) using LIMA to LAD; RIMA to PL; Endoscopic right greater saphenous vein harvest: SVC to Diag; SVC to OM1.;  Surgeon: Linden Dolin, MD;  Location: [facility] OR;  Service: Open Heart Surgery;  Laterality: N/A;  BILATERAL IMA   CORONARY STENT INTERVENTION N/A 01/10/2018   Procedure: CORONARY STENT INTERVENTION;  Surgeon: Runell Gess, MD;  Location: [facility] INVASIVE CV LAB;  Service: Cardiovascular;  Laterality: N/A;   CORONARY/GRAFT ACUTE MI REVASCULARIZATION N/A 01/10/2018   Procedure: Coronary/Graft Acute MI Revascularization;  Surgeon: Runell Gess, MD;  Location: [facility] INVASIVE CV LAB;  Service: Cardiovascular;  Laterality: N/A;   EMBOLIZATION Left 03/19/2018   EMBOLIZATION Left 03/19/2018   Procedure: EMBOLIZATION - Left Internal;  Surgeon: Fransisco Hertz, MD;  Location: [facility] INVASIVE CV LAB;  Service: Cardiovascular;  Laterality: Left;   EMBOLIZATION Right 04/16/2018   Procedure: EMBOLIZATION;  Surgeon: Fransisco Hertz, MD;  Location: [facility] INVASIVE CV LAB;  Service: Cardiovascular;  Laterality: Right;   ENDOVEIN HARVEST OF GREATER SAPHENOUS VEIN Right 02/11/2020   Procedure: Mack Guise Of Greater Saphenous Vein using right lower extremity.;  Surgeon: Linden Dolin, MD;  Location: [facility] OR;  Service: Open Heart Surgery;  Laterality: Right;   FEMORAL ARTERY EXPLORATION Right 05/07/2018   Procedure: FEMORAL ARTERY EXPLORATION, EXTENDED PROFUNDAPLASTY;  Surgeon: Fransisco Hertz, MD;  Location: [facility] OR;  Service: Vascular;  Laterality: Right;   INTRAOPERATIVE ARTERIOGRAM Right 05/07/2018   Procedure: INTRA OPERATIVE ARTERIOGRAM;  Surgeon: Fransisco Hertz, MD;  Location:  [facility] OR;  Service: Vascular;  Laterality: Right;   LEFT HEART CATH AND CORONARY ANGIOGRAPHY N/A 01/10/2018   Procedure: LEFT HEART CATH AND CORONARY ANGIOGRAPHY;  Surgeon: Runell Gess, MD;  Location: [facility] INVASIVE CV LAB;  Service: Cardiovascular;  Laterality: N/A;   LEFT HEART CATH AND CORONARY ANGIOGRAPHY N/A 02/08/2020   Procedure: LEFT HEART CATH AND CORONARY ANGIOGRAPHY;  Surgeon: Lennette Bihari, MD;  Location: [facility] INVASIVE CV LAB;  Service: Cardiovascular;  Laterality: N/A;   PACEMAKER INSERTION  11/2005   PACEMAKER LEAD REMOVAL N/A 10/17/2014   Procedure: PACEMAKER LEAD REMOVAL;  Surgeon: Marinus Maw, MD;  Location: [facility] OR;  Service: Cardiovascular;  Laterality: N/A;   RENAL ANGIOGRAPHY Left 04/16/2018   Procedure: RENAL ANGIOGRAPHY;  Surgeon: Fransisco Hertz, MD;  Location: [facility] INVASIVE CV LAB;  Service: Cardiovascular;  Laterality: Left;  RIGHT/LEFT HEART CATH AND CORONARY/GRAFT ANGIOGRAPHY N/A 07/31/2022   Procedure: RIGHT/LEFT HEART CATH AND CORONARY/GRAFT ANGIOGRAPHY;  Surgeon: Kathleene Hazel, MD;  Location: [facility] INVASIVE CV LAB;  Service: Cardiovascular;  Laterality: N/A;   s/p ICD placement      Medtronic Maximo [id number] single chamber   TEE WITHOUT CARDIOVERSION N/A 02/11/2020   Procedure: TRANSESOPHAGEAL ECHOCARDIOGRAM (TEE);  Surgeon: Linden Dolin, MD;  Location: [facility] OR;  Service: Open Heart Surgery;  Laterality: N/A;    Family History  Problem Relation Age of Onset   Stroke Father    Coronary artery disease Maternal Aunt    Heart failure Maternal Aunt    Lung cancer Maternal Aunt    Lung cancer Maternal Uncle    Cancer Brother        Mouth   Cancer Sister        throat    Social History   Socioeconomic History   Marital status: Married    Spouse name: Not on file   Number of children: 2   Years of education: Not on file   Highest education level: 9th grade  Occupational History   Occupation: Grave digger--now disabled  Tobacco Use   Smoking status:  Former    Packs/day: 2.50    Years: 40.00    Additional pack years: 0.00    Total pack years: 100.00    Types: Cigarettes    Quit date: 01/09/2018    Years since quitting: 5.4    Passive exposure: Never   Smokeless tobacco: Never   Tobacco comments:    GAVE 1-800-QUIT-NOW  Vaping Use   Vaping Use: Never used  Substance and Sexual Activity   Alcohol use: No   Drug use: No   Sexual activity: Not Currently  Other Topics Concern   Not on file  Social History Narrative   No living will   Requests wife as health care POA--alternate is daughter or son   Would accept resuscitation but doesn't want prolonged ventilation   No tube feeds if cognitively unaware   Social Determinants of Health   Financial Resource Strain: Low Risk  (06/09/2023)   Overall Financial Resource Strain (CARDIA)    Difficulty of Paying Living Expenses: Not very hard  Food Insecurity: No Food Insecurity (06/09/2023)   Hunger Vital Sign    Worried About Running Out of Food in the Last Year: Never true    Ran Out of Food in the Last Year: Never true  Transportation Needs: No Transportation Needs (06/09/2023)   PRAPARE - Administrator, Civil Service (Medical): No    Lack of Transportation (Non-Medical): No  Physical Activity: Unknown (06/09/2023)   Exercise Vital Sign    Days of Exercise per Week: 0 days    Minutes of Exercise per Session: Not on file  Stress: No Stress Concern Present (06/09/2023)   [employer] of Occupational Health - Occupational Stress Questionnaire    Feeling of Stress : Not at all  Social Connections: Unknown (06/09/2023)   Social Connection and Isolation Panel [NHANES]    Frequency of Communication with Friends and Family: More than three times a week    Frequency of Social Gatherings with Friends and Family: Patient declined    Attends Religious Services: Patient declined    Database administrator or Organizations: No    Attends Engineer, structural: Not on  file    Marital Status: Married  Catering manager Violence: Not on file   Review of  Systems No injury No apparent swelling Lots of work in garden---sits and does things (doesn't kneel down)     Objective:   Physical Exam Constitutional:      Appearance: Normal appearance.  Musculoskeletal:     Comments: No clear knee effusions ?slight puffiness along medial right knee No apparent effusions Tenderness along MCL on right No other ligament or meniscus findings No sig crepitus  Neurological:     Mental Status: He is alert.     Comments: Antalgic gait favoring right leg            Assessment & Plan:

## 2023-06-10 NOTE — Assessment & Plan Note (Signed)
Seems localized to MCL Likely strained working on his large garden (1.5 acres) Discussed ice intermittently ---and can continue the diclofenac Tylenol 1500mg  bid Will give tramadol for night use--to help sleep If not improving, will send to ortho [facility])

## 2023-06-16 ENCOUNTER — Encounter: Payer: Self-pay | Admitting: Internal Medicine

## 2023-06-16 DIAGNOSIS — M25569 Pain in unspecified knee: Secondary | ICD-10-CM

## 2023-06-17 ENCOUNTER — Encounter: Payer: Self-pay | Admitting: Physician Assistant

## 2023-06-17 ENCOUNTER — Ambulatory Visit (INDEPENDENT_AMBULATORY_CARE_PROVIDER_SITE_OTHER): Payer: HMO | Admitting: Physician Assistant

## 2023-06-17 ENCOUNTER — Other Ambulatory Visit (INDEPENDENT_AMBULATORY_CARE_PROVIDER_SITE_OTHER): Payer: HMO

## 2023-06-17 ENCOUNTER — Telehealth: Payer: Self-pay

## 2023-06-17 ENCOUNTER — Other Ambulatory Visit: Payer: Self-pay | Admitting: Internal Medicine

## 2023-06-17 DIAGNOSIS — M25561 Pain in right knee: Secondary | ICD-10-CM

## 2023-06-17 DIAGNOSIS — M25562 Pain in left knee: Secondary | ICD-10-CM

## 2023-06-17 DIAGNOSIS — G8929 Other chronic pain: Secondary | ICD-10-CM | POA: Diagnosis not present

## 2023-06-17 DIAGNOSIS — M1712 Unilateral primary osteoarthritis, left knee: Secondary | ICD-10-CM

## 2023-06-17 DIAGNOSIS — M1711 Unilateral primary osteoarthritis, right knee: Secondary | ICD-10-CM

## 2023-06-17 MED ORDER — LIDOCAINE HCL 1 % IJ SOLN
3.0000 mL | INTRAMUSCULAR | Status: AC | PRN
Start: 2023-06-17 — End: 2023-06-17
  Administered 2023-06-17: 3 mL

## 2023-06-17 MED ORDER — METHYLPREDNISOLONE ACETATE 40 MG/ML IJ SUSP
40.0000 mg | INTRAMUSCULAR | Status: AC | PRN
Start: 2023-06-17 — End: 2023-06-17
  Administered 2023-06-17: 40 mg via INTRA_ARTICULAR

## 2023-06-17 NOTE — Progress Notes (Signed)
   Procedure Note  Patient: [name]             Date of Birth: 1956/07/06           MRN: [id number]             Visit Date: 06/17/2023  Procedures: Visit Diagnoses:  1. Chronic pain of both knees   2. Primary osteoarthritis of left knee   3. Primary osteoarthritis of right knee     Large Joint Inj: R knee on 06/17/2023 5:54 PM Indications: pain Details: 22 G 1.5 in needle, anterolateral approach  Arthrogram: No  Medications: 3 mL lidocaine 1 %; 40 mg methylPREDNISolone acetate 40 MG/ML Outcome: tolerated well, no immediate complications Procedure, treatment alternatives, risks and benefits explained, specific risks discussed. Consent was given by the patient. Immediately prior to procedure a time out was called to verify the correct patient, procedure, equipment, support staff and site/side marked as required. Patient was prepped and draped in the usual sterile fashion.     HPI [name]: That we have not seen in some time.  He comes in today for bilateral knee pain.  He wants injections in both knees.  He states currently that his right knee is bothering him more than the left.  History of left knee gel injection 10/28/2019 states the left knee just began bothering him.  He has had no new injury to either knee.  Pain does awaken him at night due to his knees.  He notes giving way painful popping particularly in the right knee.  He is diabetic with a hemoglobin approximately a month ago at 8.3.  He does not check his glucose levels daily.  He is on multiple medications for his diabetes though.  He states that in the past we have not raised his glucose levels.  Review of systems: Denies any fevers or chills.  See HPI  Physical exam: General well-nourished male no acute distress.  Affect appropriate. Bilateral knees: No abnormal warmth erythema or effusion.  Good range of motion of both knees patellofemoral crepitus both knees.  Knees are ligamentously stable to valgus varus  stressing.  Radiographs: Left knee 2 views: No acute fractures or acute findings is well located.  Moderate narrowing medial joint line mild patellofemoral arthritic changes lateral joint line overall well-preserved. Right knee 2 views: Knee is well located.  Tricompartmental changes with moderate medial joint line narrowing.  Periarticular spurring off the lateral joint line.  Mild to moderate changes patellofemoral joint.  No acute fractures.  Plan: Given patient's poorly controlled diabetes we only injected the right knee.Marland Kitchen  He will monitor his glucose levels closely over the next 48 hours.  His wife has glucose monitoring device at home and is agreeable.  His glucose levels become high they can contact his primary care physician for guidance in lowering his glucose levels.  He will work on Dance movement psychotherapist.  He would be appropriate candidate for supplemental injections bilateral knees given his osteoarthritis of both knees.  Also given the fact that he is poorly controlled diabetic meds would not raise his glucose level.  He has had viscosupplementation has got good relief for multiple years in the left knee.  He has no upcoming surgery on either knee in the next 6 months.  Questions were encouraged and answered at length.  Follow-up pending approval of viscosupplementation injections

## 2023-06-17 NOTE — Telephone Encounter (Signed)
Bilateral gel injections  

## 2023-06-17 NOTE — Telephone Encounter (Signed)
Called pt to see if he requested it or was this an auto refill. It is an auto refill. He will call when he needs it. At ortho right now.

## 2023-06-18 NOTE — Telephone Encounter (Signed)
Submitted for VOB for Monovisc- bilateral knee  

## 2023-06-20 NOTE — Telephone Encounter (Signed)
Approved for Monovisc-Bilateral knee B&B No copay Covered at 80% No prior auth required

## 2023-06-20 NOTE — Telephone Encounter (Signed)
Last inj in 2020. Ok to schedule   LVM for pt to cb

## 2023-07-01 ENCOUNTER — Encounter: Payer: Self-pay | Admitting: Physician Assistant

## 2023-07-01 ENCOUNTER — Ambulatory Visit (INDEPENDENT_AMBULATORY_CARE_PROVIDER_SITE_OTHER): Payer: HMO | Admitting: Physician Assistant

## 2023-07-01 DIAGNOSIS — M1711 Unilateral primary osteoarthritis, right knee: Secondary | ICD-10-CM | POA: Diagnosis not present

## 2023-07-01 DIAGNOSIS — M1712 Unilateral primary osteoarthritis, left knee: Secondary | ICD-10-CM

## 2023-07-01 MED ORDER — HYALURONAN 88 MG/4ML IX SOSY
88.00 mg | PREFILLED_SYRINGE | INTRA_ARTICULAR | Status: AC | PRN
Start: 2023-07-01 — End: 2023-07-01
  Administered 2023-07-01: 88 mg via INTRA_ARTICULAR

## 2023-07-01 MED ORDER — HYALURONAN 88 MG/4ML IX SOSY
88.0000 mg | PREFILLED_SYRINGE | INTRA_ARTICULAR | Status: AC | PRN
Start: 2023-07-01 — End: 2023-07-01
  Administered 2023-07-01: 88 mg via INTRA_ARTICULAR

## 2023-07-01 NOTE — Progress Notes (Signed)
   Procedure Note  Patient: [name]             Date of Birth: September 15, 1956           MRN: [id number]             Visit Date: 07/01/2023 HPI: Patient returns today for scheduled Monovisc injections bilateral knees.  He states that the cortisone injections caused his glucose levels to go up to greater than 250.  He continues to have bilateral knee pain right greater than left.  Right knee with tricompartmental changes.  Left knee with medial and patellofemoral changes.  Has no up coming surgery scheduled for the next 6 months of either knee.  Physical exam: Bilateral knees good range of motion.  No abnormal warmth erythema or effusion of either knee.  Patellofemoral crepitus both lateral knees with passive range of motion.  Procedures: Visit Diagnoses:  1. Primary osteoarthritis of left knee   2. Primary osteoarthritis of right knee     Large Joint Inj: bilateral knee on 07/01/2023 5:01 PM Indications: pain Details: 22 G 1.5 in needle, anterolateral approach  Arthrogram: No  Medications (Right): 88 mg Hyaluronan 88 MG/4ML Medications (Left): 88 mg Hyaluronan 88 MG/4ML Outcome: tolerated well, no immediate complications Procedure, treatment alternatives, risks and benefits explained, specific risks discussed. Consent was given by the patient. Immediately prior to procedure a time out was called to verify the correct patient, procedure, equipment, support staff and site/side marked as required. Patient was prepped and draped in the usual sterile fashion.      Plan: He will follow-up with [place] in 8 weeks to assess the response he had to the injections.  Questions were encouraged and answered at length

## 2023-07-02 ENCOUNTER — Ambulatory Visit: Payer: HMO | Attending: Nurse Practitioner | Admitting: Nurse Practitioner

## 2023-07-02 ENCOUNTER — Encounter: Payer: Self-pay | Admitting: Nurse Practitioner

## 2023-07-02 VITALS — BP 112/64 | HR 68 | Ht 70.0 in | Wt 276.2 lb

## 2023-07-02 DIAGNOSIS — J449 Chronic obstructive pulmonary disease, unspecified: Secondary | ICD-10-CM

## 2023-07-02 DIAGNOSIS — I739 Peripheral vascular disease, unspecified: Secondary | ICD-10-CM

## 2023-07-02 DIAGNOSIS — I714 Abdominal aortic aneurysm, without rupture, unspecified: Secondary | ICD-10-CM | POA: Diagnosis not present

## 2023-07-02 DIAGNOSIS — I255 Ischemic cardiomyopathy: Secondary | ICD-10-CM | POA: Diagnosis not present

## 2023-07-02 DIAGNOSIS — E118 Type 2 diabetes mellitus with unspecified complications: Secondary | ICD-10-CM

## 2023-07-02 DIAGNOSIS — Z95828 Presence of other vascular implants and grafts: Secondary | ICD-10-CM | POA: Diagnosis not present

## 2023-07-02 DIAGNOSIS — I2510 Atherosclerotic heart disease of native coronary artery without angina pectoris: Secondary | ICD-10-CM

## 2023-07-02 DIAGNOSIS — G4733 Obstructive sleep apnea (adult) (pediatric): Secondary | ICD-10-CM

## 2023-07-02 DIAGNOSIS — Z7984 Long term (current) use of oral hypoglycemic drugs: Secondary | ICD-10-CM

## 2023-07-02 DIAGNOSIS — I5022 Chronic systolic (congestive) heart failure: Secondary | ICD-10-CM

## 2023-07-02 DIAGNOSIS — E785 Hyperlipidemia, unspecified: Secondary | ICD-10-CM

## 2023-07-02 DIAGNOSIS — Z951 Presence of aortocoronary bypass graft: Secondary | ICD-10-CM

## 2023-07-02 DIAGNOSIS — I10 Essential (primary) hypertension: Secondary | ICD-10-CM | POA: Diagnosis not present

## 2023-07-02 DIAGNOSIS — I745 Embolism and thrombosis of iliac artery: Secondary | ICD-10-CM

## 2023-07-02 NOTE — Progress Notes (Signed)
Office Visit    Patient Name: [name] Date of Encounter: 07/02/2023  Primary Care Provider:  Karie Schwalbe, MD Primary Cardiologist:  Rollene Rotunda, MD  Chief Complaint    67 year old male with a history of CAD s/p CABG x4 in 2021, L AA clipping the time of his bypass surgery, chronic systolic heart failure/ICM s/p ICD, PVD, AAA s/p EVAR, hypertension, hyperlipidemia, OSA, COPD, type 2 diabetes, GERD, arthritis, and tobacco use who presents for follow-up related to CAD and ICM.   Past Medical History    Past Medical History:  Diagnosis Date   Arthritis    Automatic implantable cardioverter-defibrillator in situ    COPD (chronic obstructive pulmonary disease) (HCC)    emphysema by CXR   Coronary artery disease    Diabetes mellitus    type 2  no meds   Diverticulosis of colon    GERD (gastroesophageal reflux disease)    with esophagitis   History of colonic polyps    Hypertension    Non-ischemic cardiomyopathy (HCC)    Presence of permanent cardiac pacemaker    PVD (peripheral vascular disease) (HCC)    bilateral common iliac artery aneurysms. Right SFA occlusion over a long segment. Left SFA disease with occlusion of the left TP trunk. Artirogram Oct. 2006   Sleep apnea    Tobacco abuse    Urinary incontinence    detrussor instability   Past Surgical History:  Procedure Laterality Date   ABDOMINAL AORTIC ENDOVASCULAR STENT GRAFT N/A 05/07/2018   Procedure: ABDOMINAL AORTIC ENDOVASCULAR STENT GRAFT;  Surgeon: Fransisco Hertz, MD;  Location: [facility] OR;  Service: Vascular;  Laterality: N/A;   CARDIAC CATHETERIZATION  06/2004   negative   CARDIAC CATHETERIZATION  02/08/2020   CLIPPING OF ATRIAL APPENDAGE N/A 02/11/2020   Procedure: Clipping Of Atrial Appendage using AtriCure 35 MM AtriClip.;  Surgeon: Linden Dolin, MD;  Location: [facility] OR;  Service: Open Heart Surgery;  Laterality: N/A;   COLONOSCOPY  04/2005   COPD exacerbation     CORONARY ANGIOPLASTY  01/09/2018    CORONARY ARTERY BYPASS GRAFT N/A 02/11/2020   Procedure: CORONARY ARTERY BYPASS GRAFTING (CABG) using LIMA to LAD; RIMA to PL; Endoscopic right greater saphenous vein harvest: SVC to Diag; SVC to OM1.;  Surgeon: Linden Dolin, MD;  Location: [facility] OR;  Service: Open Heart Surgery;  Laterality: N/A;  BILATERAL IMA   CORONARY STENT INTERVENTION N/A 01/10/2018   Procedure: CORONARY STENT INTERVENTION;  Surgeon: Runell Gess, MD;  Location: [facility] INVASIVE CV LAB;  Service: Cardiovascular;  Laterality: N/A;   CORONARY/GRAFT ACUTE MI REVASCULARIZATION N/A 01/10/2018   Procedure: Coronary/Graft Acute MI Revascularization;  Surgeon: Runell Gess, MD;  Location: [facility] INVASIVE CV LAB;  Service: Cardiovascular;  Laterality: N/A;   EMBOLIZATION Left 03/19/2018   EMBOLIZATION Left 03/19/2018   Procedure: EMBOLIZATION - Left Internal;  Surgeon: Fransisco Hertz, MD;  Location: [facility] INVASIVE CV LAB;  Service: Cardiovascular;  Laterality: Left;   EMBOLIZATION Right 04/16/2018   Procedure: EMBOLIZATION;  Surgeon: Fransisco Hertz, MD;  Location: [facility] INVASIVE CV LAB;  Service: Cardiovascular;  Laterality: Right;   ENDOVEIN HARVEST OF GREATER SAPHENOUS VEIN Right 02/11/2020   Procedure: Mack Guise Of Greater Saphenous Vein using right lower extremity.;  Surgeon: Linden Dolin, MD;  Location: [facility] OR;  Service: Open Heart Surgery;  Laterality: Right;   FEMORAL ARTERY EXPLORATION Right 05/07/2018   Procedure: FEMORAL ARTERY EXPLORATION, EXTENDED PROFUNDAPLASTY;  Surgeon: Fransisco Hertz, MD;  Location: [facility] OR;  Service: Vascular;  Laterality: Right;   INTRAOPERATIVE ARTERIOGRAM Right 05/07/2018   Procedure: INTRA OPERATIVE ARTERIOGRAM;  Surgeon: Fransisco Hertz, MD;  Location: [facility] OR;  Service: Vascular;  Laterality: Right;   LEFT HEART CATH AND CORONARY ANGIOGRAPHY N/A 01/10/2018   Procedure: LEFT HEART CATH AND CORONARY ANGIOGRAPHY;  Surgeon: Runell Gess, MD;  Location: [facility] INVASIVE CV LAB;  Service: Cardiovascular;   Laterality: N/A;   LEFT HEART CATH AND CORONARY ANGIOGRAPHY N/A 02/08/2020   Procedure: LEFT HEART CATH AND CORONARY ANGIOGRAPHY;  Surgeon: Lennette Bihari, MD;  Location: [facility] INVASIVE CV LAB;  Service: Cardiovascular;  Laterality: N/A;   PACEMAKER INSERTION  11/2005   PACEMAKER LEAD REMOVAL N/A 10/17/2014   Procedure: PACEMAKER LEAD REMOVAL;  Surgeon: Marinus Maw, MD;  Location: [facility] OR;  Service: Cardiovascular;  Laterality: N/A;   RENAL ANGIOGRAPHY Left 04/16/2018   Procedure: RENAL ANGIOGRAPHY;  Surgeon: Fransisco Hertz, MD;  Location: [facility] INVASIVE CV LAB;  Service: Cardiovascular;  Laterality: Left;   RIGHT/LEFT HEART CATH AND CORONARY/GRAFT ANGIOGRAPHY N/A 07/31/2022   Procedure: RIGHT/LEFT HEART CATH AND CORONARY/GRAFT ANGIOGRAPHY;  Surgeon: Kathleene Hazel, MD;  Location: [facility] INVASIVE CV LAB;  Service: Cardiovascular;  Laterality: N/A;   s/p ICD placement      [employer] Maximo [phone] single chamber   TEE WITHOUT CARDIOVERSION N/A 02/11/2020   Procedure: TRANSESOPHAGEAL ECHOCARDIOGRAM (TEE);  Surgeon: Linden Dolin, MD;  Location: [facility] OR;  Service: Open Heart Surgery;  Laterality: N/A;    Allergies  Allergies  Allergen Reactions   Spironolactone Other (See Comments)    hyperkalemia     Labs/Other Studies Reviewed    The following studies were reviewed today:  Cardiac Studies & Procedures   CARDIAC CATHETERIZATION  CARDIAC CATHETERIZATION 07/31/2022  Narrative   Mid LAD-1 lesion is 40% stenosed.   1st Diag lesion is 65% stenosed.   2nd Mrg lesion is 100% stenosed.   Mid RCA lesion is 100% stenosed.   Prox RCA to Mid RCA lesion is 100% stenosed.   Mid LAD-2 lesion is 99% stenosed.   Ost Cx lesion is 40% stenosed.   Previously placed Prox Cx to Mid Cx stent of unknown type is  widely patent.   LIMA graft was visualized by non-selective angiography and is normal in caliber.   SVG graft was visualized by angiography.   SVG graft was visualized by angiography and is  normal in caliber.   The graft exhibits no disease.  Severe triple vessel CAD s/p 4V CABG. No vein graft or free RIMA graft markers placed at the time of his surgery. Difficult to locate grafts because of this. Severe mid LAD stenosis. The mid and distal LAD fills from the patent LIMA graft Severe Diagonal stenosis. Patent SVG to Diagonal Moderate ostial Circumflex stenosis. Patent mid Circumflex stent. Occluded obtuse marginal. Patent vein graft to obtuse marginal branch Chronic occlusion of the proximal RCA. The graft (free RIMA) to this vessel could not be located. As above, no graft markers in place. No collateral filling of the native RCA. This graft is presumed to be patent. Normal right and left heart pressures (RA 2, RV 25/1/3, PA 26/5 mean 14, PCWP 5, LV 144/3/12, AO 142/59  Recommendations: Continue medical management of CAD. His dyspnea is likely multifactorial given COPD, sleep apnea, deconditioning, morbid obesity and underlying CAD.  Findings Coronary Findings Diagnostic  Dominance: Right  Left Anterior Descending Mid LAD-1 lesion is 40% stenosed. Mid LAD-2 lesion is  99% stenosed.  First Diagonal Branch 1st Diag lesion is 65% stenosed.  Left Circumflex Ost Cx lesion is 40% stenosed. Previously placed Prox Cx to Mid Cx stent of unknown type is  widely patent.  Second Obtuse Marginal Branch Collaterals 2nd Mrg filled by collaterals from Dist LAD.  2nd Mrg lesion is 100% stenosed.  Right Coronary Artery Prox RCA to Mid RCA lesion is 100% stenosed. Mid RCA lesion is 100% stenosed. The lesion was previously treated using a stent (unknown type) over 2 years ago.  Right Posterior Descending Artery  LIMA LIMA Graft To Dist LAD LIMA graft was visualized by non-selective angiography and is normal in caliber.  Saphenous Graft To 1st Diag SVG graft was visualized by angiography.  The graft exhibits no disease.  Saphenous Graft To 2nd Mrg SVG graft was visualized by  angiography and is normal in caliber.  Intervention  No interventions have been documented.   CARDIAC CATHETERIZATION  CARDIAC CATHETERIZATION 02/08/2020  Narrative  Prox RCA-1 lesion is 99% stenosed.  Prox RCA-2 lesion is 99% stenosed with 99% stenosed side branch in RV Branch.  RPDA lesion is 80% stenosed.  Prox Cx to Mid Cx lesion is 5% stenosed.  2nd Mrg lesion is 100% stenosed.  Ost Cx lesion is 70% stenosed.  1st Diag lesion is 65% stenosed.  Mid LAD-1 lesion is 40% stenosed.  Mid LAD-2 lesion is 75% stenosed.  LV end diastolic pressure is normal.  Severe multivessel CAD with the culprit lesion being a large dominant RCA with 99% proximal stenosis and 99% stenosis in the proximal portion of the previously placed mid RCA stent.  There is also a large PDA vessel with diffuse proximal 80% stenosis with a "flush and fill "phenomena due to competitive filling via left to right collaterals.  Significant concomitant CAD with diffuse 60+ percent stenosis in a large proximal diagonal vessel with 40% stenosis in the LAD proximal to a large septal perforating artery.  The LAD after the septal perforating artery on a band has 75% ostial stenosis.  The left circumflex vessel has 70% ostial stenosis.  The stent in the mid circumflex is patent although the superior branch beyond the stent is totally occluded.  There is left to left collaterals supplying this branch.  Moderately severe LV dysfunction with EF estimate approximately 35% with significant hypokinesis involving the inferior wall.  LVEDP 12 mm.  RECOMMENDATION: Angiograms were reviewed with Dr. Katrinka Blazing in the laboratory.  With the patient's significant multivessel CAD with good distal targets, recommend initial surgical consultation for consideration of CABG revascularization surgery.  Will need Plavix washout.  Will start heparin 8 hours post sheath removal in light of the subtotal ulcerated plaque in the  RCA.  Findings Coronary Findings Diagnostic  Dominance: Right  Left Anterior Descending Mid LAD-1 lesion is 40% stenosed. Mid LAD-2 lesion is 75% stenosed.  First Diagonal Branch 1st Diag lesion is 65% stenosed.  Left Circumflex Ost Cx lesion is 70% stenosed. Prox Cx to Mid Cx lesion is 5% stenosed. The lesion was previously treated.  Second Obtuse Marginal Branch Collaterals 2nd Mrg filled by collaterals from Dist LAD.  2nd Mrg lesion is 100% stenosed.  Right Coronary Artery Prox RCA-1 lesion is 99% stenosed. Prox RCA-2 lesion is 99% stenosed with 99% stenosed side branch in RV Branch. The lesion was previously treated.  Right Posterior Descending Artery Collaterals RPDA filled by collaterals from Dist Cx.  RPDA lesion is 80% stenosed.  Intervention  No interventions have been documented.  STRESS TESTS  MYOCARDIAL PERFUSION IMAGING 12/05/2020  Narrative  Nuclear stress EF: 30%.  The left ventricular ejection fraction is moderately decreased (30-44%).  There was no ST segment deviation noted during stress.  Defect 1: There is a medium defect of moderate severity present in the basal inferior, basal inferolateral, mid inferior, mid inferolateral and apical inferior location.  Findings consistent with prior myocardial infarction.  This is a high risk study.  High risk due to reduced EF. No evidence of ischemia. There is a fixed defect of moderate intensity in the inferior/inferolateral walls from base to apex, similar to prior.   ECHOCARDIOGRAM  ECHOCARDIOGRAM COMPLETE 02/08/2020  Narrative ECHOCARDIOGRAM REPORT    Patient Name:   [name] Date of Exam: 02/08/2020 Medical Rec #:  [id number]     Height:       69.0 in Accession #:    [id number]    Weight:       288.0 lb Date of Birth:  [birth year]    BSA:          2.41 m Patient Age:    63 years      BP:           149/64 mmHg Patient Gender: M             HR:           64 bpm. Exam Location:   Inpatient  Procedure: 2D Echo  Indications:    CAD Native Vessel 414.01/I25.10  History:        Patient has prior history of Echocardiogram examinations, most recent 08/28/2018. CHF, CAD, COPD; Risk Factors:Hypertension, Diabetes, Dyslipidemia, Sleep Apnea and Former Smoker.  Sonographer:    Ross Ludwig RDCS (AE) Referring Phys: [phone] THOMAS A KELLY   Sonographer Comments: Patient is morbidly obese and suboptimal subcostal window. IMPRESSIONS   1. Left ventricular ejection fraction, by estimation, is 45 to 50%. The left ventricle has mildly decreased function. The left ventricle demonstrates global hypokinesis. The left ventricular internal cavity size was moderately dilated. There is moderate left ventricular hypertrophy. Left ventricular diastolic parameters are consistent with Grade I diastolic dysfunction (impaired relaxation). 2. Right ventricular systolic function is normal. The right ventricular size is normal. 3. The mitral valve is normal in structure and function. No evidence of mitral valve regurgitation. No evidence of mitral stenosis. 4. The aortic valve is normal in structure and function. Aortic valve regurgitation is not visualized. No aortic stenosis is present. 5. The inferior vena cava is normal in size with greater than 50% respiratory variability, suggesting right atrial pressure of 3 mmHg.  Comparison(s): Prior images unable to be directly viewed, comparison made by report only. No significant change from prior study.  FINDINGS Left Ventricle: Left ventricular ejection fraction, by estimation, is 45 to 50%. The left ventricle has mildly decreased function. The left ventricle demonstrates global hypokinesis. The left ventricular internal cavity size was moderately dilated. There is moderate left ventricular hypertrophy. Left ventricular diastolic parameters are consistent with Grade I diastolic dysfunction (impaired relaxation).  Right Ventricle: The right ventricular  size is normal. No increase in right ventricular wall thickness. Right ventricular systolic function is normal.  Left Atrium: Left atrial size was normal in size.  Right Atrium: Right atrial size was normal in size.  Pericardium: There is no evidence of pericardial effusion.  Mitral Valve: The mitral valve is normal in structure and function. Normal mobility of the mitral valve leaflets. No evidence of mitral valve regurgitation. No evidence  of mitral valve stenosis. MV peak gradient, 3.3 mmHg. The mean mitral valve gradient is 1.0 mmHg.  Tricuspid Valve: The tricuspid valve is normal in structure. Tricuspid valve regurgitation is not demonstrated. No evidence of tricuspid stenosis.  Aortic Valve: The aortic valve is normal in structure and function. Aortic valve regurgitation is not visualized. No aortic stenosis is present. Aortic valve mean gradient measures 7.0 mmHg. Aortic valve peak gradient measures 11.7 mmHg. Aortic valve area, by VTI measures 1.58 cm.  Pulmonic Valve: The pulmonic valve was normal in structure. Pulmonic valve regurgitation is not visualized. No evidence of pulmonic stenosis.  Aorta: The aortic root is normal in size and structure.  Venous: The inferior vena cava is normal in size with greater than 50% respiratory variability, suggesting right atrial pressure of 3 mmHg.  IAS/Shunts: No atrial level shunt detected by color flow Doppler.   LEFT VENTRICLE PLAX 2D LVIDd:         6.20 cm      Diastology LVIDs:         5.30 cm      LV e' lateral:   5.44 cm/s LV PW:         1.40 cm      LV E/e' lateral: 9.1 LV IVS:        1.70 cm      LV e' medial:    4.35 cm/s LVOT diam:     2.00 cm      LV E/e' medial:  11.3 LV SV:         47.75 ml LV SV Index:   22.69 LVOT Area:     3.14 cm  LV Volumes (MOD) LV vol d, MOD A2C: 183.0 ml LV vol d, MOD A4C: 229.0 ml LV vol s, MOD A2C: 151.0 ml LV vol s, MOD A4C: 124.0 ml LV SV MOD A2C:     32.0 ml LV SV MOD A4C:     229.0  ml LV SV MOD BP:      69.5 ml  RIGHT VENTRICLE RV Basal diam:  3.10 cm TAPSE (M-mode): 2.8 cm  LEFT ATRIUM             Index       RIGHT ATRIUM           Index LA diam:        3.20 cm 1.33 cm/m  RA Area:     19.80 cm LA Vol (A2C):   47.9 ml 19.86 ml/m RA Volume:   50.80 ml  21.06 ml/m LA Vol (A4C):   42.1 ml 17.45 ml/m LA Biplane Vol: 46.1 ml 19.11 ml/m AORTIC VALVE AV Area (Vmax):    1.77 cm AV Area (Vmean):   1.62 cm AV Area (VTI):     1.58 cm AV Vmax:           171.00 cm/s AV Vmean:          119.000 cm/s AV VTI:            0.303 m AV Peak Grad:      11.7 mmHg AV Mean Grad:      7.0 mmHg LVOT Vmax:         96.60 cm/s LVOT Vmean:        61.300 cm/s LVOT VTI:          0.152 m LVOT/AV VTI ratio: 0.50  AORTA Ao Root diam: 3.50 cm Ao Asc diam:  3.60 cm  MITRAL VALVE MV Area (PHT): 3.99  cm    SHUNTS MV Peak grad:  3.3 mmHg    Systemic VTI:  0.15 m MV Mean grad:  1.0 mmHg    Systemic Diam: 2.00 cm MV Vmax:       0.91 m/s MV Vmean:      54.2 cm/s MV Decel Time: 190 msec MV E velocity: 49.30 cm/s MV A velocity: 79.70 cm/s MV E/A ratio:  0.62  Donato Schultz MD Electronically signed by Donato Schultz MD Signature Date/Time: 02/08/2020/5:22:22 PM    Final   TEE  ECHO INTRAOPERATIVE TEE 02/17/2020  Narrative *INTRAOPERATIVE TRANSESOPHAGEAL REPORT *    Patient Name:   [name] Date of Exam: 02/11/2020 Medical Rec #:  [id number]     Height:       69.0 in Accession #:    [id number]    Weight:       281.4 lb Date of Birth:  07-Jul-1956    BSA:          2.39 m Patient Age:    63 years      BP:           128/72 mmHg Patient Gender: M             HR:           66 bpm. Exam Location:  Inpatient  Transesophogeal exam was perform intraoperatively during surgical procedure. Patient was closely monitored under general anesthesia during the entirety of examination.  Indications:     CABG Performing Phys: [id number] ATKINS Diagnosing Phys: Marcene Duos  MD  Complications: No known complications during this procedure. POST-OP IMPRESSIONS - Left Ventricle: has moderately reduced systolic function, with an ejection fraction of 35-40%. - Right Ventricle: The right ventricle appears unchanged from pre-bypass. - Aorta: The aorta appears unchanged from pre-bypass. - Left Atrium: The left atrium appears unchanged from pre-bypass. - Left Atrial Appendage: The left atrial appendage appears unchanged from pre-bypass. - Aortic Valve: The aortic valve appears unchanged from pre-bypass. - Mitral Valve: The mitral valve appears unchanged from pre-bypass. There is mild regurgitation. - Tricuspid Valve: The tricuspid valve appears unchanged from pre-bypass. - Interatrial Septum: The interatrial septum appears unchanged from pre-bypass. - Interventricular Septum: The interventricular septum appears unchanged from pre-bypass.  PRE-OP FINDINGS Left Ventricle: The left ventricle has mild-moderately reduced systolic function, with an ejection fraction of 40-45%. The cavity size was normal. There is moderately increased left ventricular wall thickness.  Right Ventricle: The right ventricle has normal systolic function. The cavity was normal. There is no increase in right ventricular wall thickness.  Left Atrium: Left atrial size was normal in size.  Right Atrium: Right atrial size was normal in size. Right atrial pressure is estimated at 10 mmHg.  Interatrial Septum: No atrial level shunt detected by color flow Doppler.  Pericardium: A small pericardial effusion is present. The pericardial effusion is localized near the right ventricle.  Mitral Valve: The mitral valve is normal in structure. Mild thickening of the mitral valve leaflet. Mitral valve regurgitation is trivial by color flow Doppler.  Tricuspid Valve: The tricuspid valve was normal in structure. Tricuspid valve regurgitation was not visualized by color flow Doppler.  Aortic Valve: The  aortic valve is tricuspid There is mild thickening of the aortic valve Aortic valve regurgitation was not visualized by color flow Doppler. There is no stenosis of the aortic valve.  Pulmonic Valve: The pulmonic valve was normal in structure. Pulmonic valve regurgitation is not visualized by color flow  Doppler.   +--------------+--------++ LEFT VENTRICLE         +--------------+--------++ PLAX 2D                +--------------+--------++ LVOT diam:    2.20 cm  +--------------+--------++ LVOT Area:    3.80 cm +--------------+--------++                        +--------------+--------++  +-------------+------------++ AORTIC VALVE              +-------------+------------++ AV Vmax:     168.00 cm/s  +-------------+------------++ AV Vmean:    120.000 cm/s +-------------+------------++ AV VTI:      0.348 m      +-------------+------------++ AV Peak Grad:11.3 mmHg    +-------------+------------++ AV Mean Grad:6.0 mmHg     +-------------+------------++  +-------------+-------++ AORTA                +-------------+-------++ Ao Root diam:3.30 cm +-------------+-------++   +--------------+-------+ SHUNTS                +--------------+-------+ Systemic Diam:2.20 cm +--------------+-------+   Marcene Duos MD Electronically signed by Marcene Duos MD Signature Date/Time: 02/17/2020/6:36:29 PM    Final           Recent Labs: 07/29/2022: NT-Pro BNP 227 05/21/2023: ALT 16; BUN 23; Creatinine, Ser 1.14; Hemoglobin 13.4; Platelets 343.0; Potassium 4.5; Sodium 137  Recent Lipid Panel    Component Value Date/Time   CHOL 112 05/21/2023 1130   CHOL 139 11/03/2020 0858   TRIG 352.0 (H) 05/21/2023 1130   TRIG 575 (HH) 01/02/2007 0000   HDL 33.10 (L) 05/21/2023 1130   HDL 37 (L) 11/03/2020 0858   CHOLHDL 3 05/21/2023 1130   VLDL 70.4 (H) 05/21/2023 1130   LDLCALC 51 11/03/2020 0858   LDLDIRECT 27.0 05/21/2023  1130    History of Present Illness    67 year old male with the above past medical history including CAD s/p CABG x4 in 2021, L AA clipping the time of his bypass surgery, chronic systolic heart failure/ICM s/p ICD, PVD, AAA s/p EVAR, hypertension,  hyperlipidemia, OSA, COPD, type 2 diabetes, GERD, arthritis, and tobacco use.   He has a history of CAD initially diagnosed by cardiac cath in 2012, s/p NSTEMI, DES-p-mLCx, DES-mRCA in 2019. Cardiac catheterization in February 2021 showed severe multivessel CAD.  He underwent CABG x4 (LIMA-LAD; RIMA-PL; SVG-Diag; SVG-OM1) in February 2021.  He had left atrial clipping at the time of his CABG. Additionally, he has a history of ICM, s/p ICD in 2012. At [facility], Dr. Ladona Ridgel elected not to replace his ICD. Echocardiogram at the time of his bypass surgery showed EF 45 to 50%, mildly decreased LV function, LV global hypokinesis, G1 DD, no significant valvular abnormalities. Carotid Dopplers at the time of his surgery showed 1 to 39% B ICA stenosis.  EKG in November 2021 showed new lateral T wave inversion. Lexiscan Myoview in December 2021 showed no evidence of ischemia.  He has a history of peripheral vascular disease and underwent aorto bi-iliac stent graft placement with left common femoral artery repair in May 2019, followed by vascular surgery. Additionally, he has a history of COPD and follows with pulmonology.  He was started on Entresto in the setting of ICM. Spironolactone was not initiated in the setting of elevated potassium. He underwent R/LHC on 07/31/2022 which revealed severe triple-vessel CAD s/p CABG x4, severe mid LAD stenosis (mid and distal LAD filling from patent LIMA graft), severe diagonal  stenosis with patent SVG-diagonal, moderate ostial circumflex stenosis with patent mid circumflex stent, occluded OM with patent vein graft to OM branch, CTO of P RCA unable to locate graft (free RIMA), no collateral filling of native RCA, graft presumed to be patent,  normal R/L heart pressures.  Continued medical management of CAD was recommended.  His dyspnea was thought to be likely multifactorial given COPD, sleep apnea, morbid obesity, deconditioning, and underlying CAD. He was last seen in the office on 12/30/2022 and was stable from a cardiac standpoint.  He reported stable chronic dyspnea, unchanged from prior visits.  He presents today for follow-up accompanied by his wife. Since his last visit he has done well from a cardiac standpoint.  He denies any symptoms concerning for angina, denies dyspnea, edema, PND, orthopnea, weight gain.  He has been active and works in his garden every day.  He has been struggling with some bilateral knee pain in the setting of arthritis.  He received a gel injection yesterday.  Overall, he reports feeling well.  Home Medications    Current Outpatient Medications  Medication Sig Dispense Refill   albuterol (VENTOLIN HFA) 108 (90 Base) MCG/ACT inhaler Inhale 2 puffs into the lungs every 6 (six) hours as needed for wheezing or shortness of breath. 18 g 0   aspirin 81 MG chewable tablet Chew 1 tablet (81 mg total) by mouth daily.     atorvastatin (LIPITOR) 80 MG tablet TAKE 1 TABLET BY MOUTH ONCE A DAY AT SIXIN THE EVENING 90 tablet 3   Budeson-Glycopyrrol-Formoterol (BREZTRI AEROSPHERE) 160-9-4.8 MCG/ACT AERO Inhale 2 puffs into the lungs in the morning and at bedtime. 5.9 g 0   clopidogrel (PLAVIX) 75 MG tablet TAKE 1 TABLET BY MOUTH EVERY DAY 90 tablet 3   diclofenac Sodium (VOLTAREN) 1 % GEL Apply 1 Application topically 3 (three) times daily as needed (pain).     empagliflozin (JARDIANCE) 25 MG TABS tablet Take 1 tablet (25 mg total) by mouth daily. 90 tablet 3   fluticasone (FLONASE) 50 MCG/ACT nasal spray SPRAY 1 SPRAY INTO BOTH NOSTRILS DAILY 16 g 2   furosemide (LASIX) 20 MG tablet TAKE ONE TABLET BY MOUTH ONCE A DAY 90 tablet 3   gabapentin (NEURONTIN) 100 MG capsule TAKE 1 CAPSULE BY MOUTH 3 TIMES DAILY 90 capsule 3    glipiZIDE (GLUCOTROL) 10 MG tablet Take 1 tablet (10 mg total) by mouth 2 (two) times daily before a meal. 180 tablet 3   isosorbide mononitrate (IMDUR) 30 MG 24 hr tablet TAKE ONE TABLET BY MOUTH ONCE EVERY EVENING 90 tablet 3   metFORMIN (GLUCOPHAGE-XR) 500 MG 24 hr tablet TAKE 2 TABLETS BY MOUTH EVERY DAY WITH BREAKFAST 180 tablet 3   metoprolol tartrate (LOPRESSOR) 25 MG tablet TAKE 1 TABLET BY MOUTH 2 TIMES A DAY 180 tablet 3   Multiple Vitamin (MULTIVITAMIN WITH MINERALS) TABS tablet Take 1 tablet by mouth daily.     nitroGLYCERIN (NITROSTAT) 0.4 MG SL tablet Place 0.4 mg under the tongue every 5 (five) minutes as needed for chest pain.     pantoprazole (PROTONIX) 40 MG tablet Take 1 tablet (40 mg total) by mouth 2 (two) times daily. 180 tablet 3   sacubitril-valsartan (ENTRESTO) 97-103 MG Take 1 tablet by mouth 2 (two) times daily. 180 tablet 3   traMADol (ULTRAM) 50 MG tablet Take 1-2 tablets (50-100 mg total) by mouth at bedtime as needed. 20 tablet 0   vitamin B-12 (CYANOCOBALAMIN) 1000 MCG tablet Take 1,000  mcg by mouth daily.     No current facility-administered medications for this visit.     Review of Systems    He denies chest pain, palpitations, dyspnea, pnd, orthopnea, n, v, dizziness, syncope, edema, weight gain, or early satiety. All other systems reviewed and are otherwise negative except as noted above.   Physical Exam    VS:  BP 112/64   Pulse 68   Ht 5\' 10"  (1.778 m)   Wt 276 lb 3.2 oz (125.3 kg)   SpO2 95%   BMI 39.63 kg/m  GEN: Well nourished, well developed, in no acute distress. HEENT: normal. Neck: Supple, no JVD, carotid bruits, or masses. Cardiac: RRR, no murmurs, rubs, or gallops. No clubbing, cyanosis, edema.  Radials/DP/PT 2+ and equal bilaterally.  Respiratory:  Respirations regular and unlabored, clear to auscultation bilaterally. GI: Soft, nontender, nondistended, BS + x 4. MS: no deformity or atrophy. Skin: warm and dry, no rash. Neuro:   Strength and sensation are intact. Psych: Normal affect.  Accessory Clinical Findings    ECG personally reviewed by me today - EKG Interpretation Date/Time:  Wednesday July 02 2023 10:09:48 EDT Ventricular Rate:  68 PR Interval:  234 QRS Duration:  110 QT Interval:  428 QTC Calculation: 455 R Axis:   40  Text Interpretation: Sinus rhythm with sinus arrhythmia with 1st degree A-V block When compared with ECG of 21-May-2020 14:51, T wave inversion no longer evident in Inferior leads Nonspecific T wave abnormality, improved in Lateral leads Confirmed by Bernadene Person ([id number]) on 07/02/2023 10:32:42 AM  - no acute changes.   Lab Results  Component Value Date   WBC 7.8 05/21/2023   HGB 13.4 05/21/2023   HCT 41.6 05/21/2023   MCV 69.5 Repeated and verified X2. (L) 05/21/2023   PLT 343.0 05/21/2023   Lab Results  Component Value Date   CREATININE 1.14 05/21/2023   BUN 23 05/21/2023   NA 137 05/21/2023   K 4.5 05/21/2023   CL 103 05/21/2023   CO2 26 05/21/2023   Lab Results  Component Value Date   ALT 16 05/21/2023   AST 18 05/21/2023   ALKPHOS 71 05/21/2023   BILITOT 0.7 05/21/2023   Lab Results  Component Value Date   CHOL 112 05/21/2023   HDL 33.10 (L) 05/21/2023   LDLCALC 51 11/03/2020   LDLDIRECT 27.0 05/21/2023   TRIG 352.0 (H) 05/21/2023   CHOLHDL 3 05/21/2023    Lab Results  Component Value Date   HGBA1C 8.3 (H) 05/21/2023    Assessment & Plan    1. CAD: S/p CABG x4 in 2021. R/LHC on 07/31/2022 revealed severe triple-vessel CAD s/p CABG x4, severe mid LAD stenosis (mid and distal LAD filling from patent LIMA graft), severe diagonal stenosis with patent SVG-diagonal, moderate ostial circumflex stenosis with patent mid circumflex stent, occluded OM with patent vein graft to OM branch, CTO of P RCA unable to locate graft (free RIMA), no collateral filling of native RCA, graft presumed to be patent, normal R/L heart pressures.  Medical management was recommended.   Stable with no anginal symptoms.  Continue aspirin, Plavix, isosorbide, metoprolol, Entresto, Jardiance, Lasix, and Lipitor.   2.  Chronic systolic heart failure/ICM: S/p ICD in 2012, was not replaced at [facility]. Echo in 2021 showed EF 45 to 50%, mildly decreased LV function, LV global hypokinesis, G1 DD, no significant valvular abnormalities. Euvolemic and well compensated on exam.  Continue current medications as above.    3. Hypertension: BP well controlled.  Continue current antihypertensive regimen.    4. Hyperlipidemia: LDL was 70 in 04/2023. Continue aspirin, Lipitor.   5. PVD/AAA s/p EVAR: S/p aorto bi-iliac stent graft placement with left common femoral artery repair in May 2019, CT angio abdomen/pelvis in 07/2022 was stable.  Following with vascular surgery.   6. OSA: On CPAP.  Follows with pulmonology.   7. COPD: Denies any recent dyspnea.  Following with pulmonology.   8. Type 2 diabetes: A1C was 8.3 in 04/2023.  He reports some dietary indiscretion. Monitored and managed per PCP.   9. Disposition: Follow-up in 6 months.       Joylene Grapes, NP 07/02/2023, 10:35 AM

## 2023-07-02 NOTE — Patient Instructions (Signed)
Medication Instructions:  Your physician recommends that you continue on your current medications as directed. Please refer to the Current Medication list given to you today.  *If you need a refill on your cardiac medications before your next appointment, please call your pharmacy*   Lab Work: NONE ordered at this time of appointment     Testing/Procedures: NONE ordered at this time of appointment    Follow-Up: At [facility], you and your health needs are our priority.  As part of our continuing mission to provide you with exceptional heart care, we have created designated Provider Care Teams.  These Care Teams include your primary Cardiologist (physician) and Advanced Practice Providers (APPs -  Physician Assistants and Nurse Practitioners) who all work together to provide you with the care you need, when you need it.  We recommend signing up for the patient portal called "MyChart".  Sign up information is provided on this After Visit Summary.  MyChart is used to connect with patients for Virtual Visits (Telemedicine).  Patients are able to view lab/test results, encounter notes, upcoming appointments, etc.  Non-urgent messages can be sent to your provider as well.   To learn more about what you can do with MyChart, go to [email or web address].    Your next appointment:   6 month(s)  Provider:   Rollene Rotunda, MD

## 2023-07-23 ENCOUNTER — Ambulatory Visit: Payer: HMO | Admitting: Family Medicine

## 2023-07-24 ENCOUNTER — Other Ambulatory Visit: Payer: Self-pay | Admitting: Internal Medicine

## 2023-07-24 ENCOUNTER — Encounter: Payer: Self-pay | Admitting: Internal Medicine

## 2023-07-24 ENCOUNTER — Ambulatory Visit: Payer: HMO | Admitting: Internal Medicine

## 2023-07-24 VITALS — BP 100/60 | HR 64 | Temp 97.8°F | Ht 70.0 in | Wt 273.0 lb

## 2023-07-24 DIAGNOSIS — M545 Low back pain, unspecified: Secondary | ICD-10-CM | POA: Diagnosis not present

## 2023-07-24 DIAGNOSIS — R300 Dysuria: Secondary | ICD-10-CM | POA: Diagnosis not present

## 2023-07-24 LAB — POC URINALSYSI DIPSTICK (AUTOMATED)
Bilirubin, UA: NEGATIVE
Glucose, UA: POSITIVE — AB
Ketones, UA: NEGATIVE
Nitrite, UA: POSITIVE
Protein, UA: NEGATIVE
Spec Grav, UA: 1.01 (ref 1.010–1.025)
Urobilinogen, UA: 0.2 E.U./dL
pH, UA: 7 (ref 5.0–8.0)

## 2023-07-24 MED ORDER — SULFAMETHOXAZOLE-TRIMETHOPRIM 800-160 MG PO TABS
1.0000 | ORAL_TABLET | Freq: Two times a day (BID) | ORAL | 1 refills | Status: DC
Start: 2023-07-24 — End: 2023-08-27

## 2023-07-24 MED ORDER — PREDNISONE 20 MG PO TABS
40.0000 mg | ORAL_TABLET | Freq: Every day | ORAL | 1 refills | Status: DC
Start: 2023-07-24 — End: 2023-08-27

## 2023-07-24 NOTE — Assessment & Plan Note (Signed)
Is on jardiance Urinalysis shows 2+ leuk, 1+ nitrite, 3+ glucose Will treat with 3 days of septra DS bid Send culture

## 2023-07-24 NOTE — Assessment & Plan Note (Addendum)
Doubt this could be related to bladder---but possible given the urinary symptoms Known lumbar disease in past--ESI didn't help Will try prednisone burst Continue heat/tylenol Consider physiatry referral if persists

## 2023-07-24 NOTE — Progress Notes (Signed)
Subjective:    Patient ID: [name], male    DOB: 04-Feb-1956, 67 y.o.   MRN: [id number]  HPI Here due to back pain  Right low back pain--upon awakening over a week ago No known injury Then sharp pain upon getting out of bed Trouble sleeping at night--can't get comfortable Sleeping in recliner Heating pad helps some. Tylenol no help  Has noticed some burning dysuria Mild testicular pain---after voiding No fever  Current Outpatient Medications on File Prior to Visit  Medication Sig Dispense Refill   albuterol (VENTOLIN HFA) 108 (90 Base) MCG/ACT inhaler Inhale 2 puffs into the lungs every 6 (six) hours as needed for wheezing or shortness of breath. 18 g 0   aspirin 81 MG chewable tablet Chew 1 tablet (81 mg total) by mouth daily.     atorvastatin (LIPITOR) 80 MG tablet TAKE 1 TABLET BY MOUTH ONCE A DAY AT SIXIN THE EVENING 90 tablet 3   Budeson-Glycopyrrol-Formoterol (BREZTRI AEROSPHERE) 160-9-4.8 MCG/ACT AERO Inhale 2 puffs into the lungs in the morning and at bedtime. 5.9 g 0   clopidogrel (PLAVIX) 75 MG tablet TAKE 1 TABLET BY MOUTH EVERY DAY 90 tablet 3   diclofenac Sodium (VOLTAREN) 1 % GEL Apply 1 Application topically 3 (three) times daily as needed (pain).     empagliflozin (JARDIANCE) 25 MG TABS tablet Take 1 tablet (25 mg total) by mouth daily. 90 tablet 3   fluticasone (FLONASE) 50 MCG/ACT nasal spray SPRAY 1 SPRAY INTO BOTH NOSTRILS DAILY 16 g 2   furosemide (LASIX) 20 MG tablet TAKE ONE TABLET BY MOUTH ONCE A DAY 90 tablet 3   gabapentin (NEURONTIN) 100 MG capsule TAKE 1 CAPSULE BY MOUTH 3 TIMES DAILY 90 capsule 3   glipiZIDE (GLUCOTROL) 10 MG tablet Take 1 tablet (10 mg total) by mouth 2 (two) times daily before a meal. 180 tablet 3   isosorbide mononitrate (IMDUR) 30 MG 24 hr tablet TAKE ONE TABLET BY MOUTH ONCE EVERY EVENING 90 tablet 3   metFORMIN (GLUCOPHAGE-XR) 500 MG 24 hr tablet TAKE 2 TABLETS BY MOUTH EVERY DAY WITH BREAKFAST 180 tablet 3   metoprolol  tartrate (LOPRESSOR) 25 MG tablet TAKE 1 TABLET BY MOUTH 2 TIMES A DAY 180 tablet 3   Multiple Vitamin (MULTIVITAMIN WITH MINERALS) TABS tablet Take 1 tablet by mouth daily.     nitroGLYCERIN (NITROSTAT) 0.4 MG SL tablet Place 0.4 mg under the tongue every 5 (five) minutes as needed for chest pain.     pantoprazole (PROTONIX) 40 MG tablet Take 1 tablet (40 mg total) by mouth 2 (two) times daily. 180 tablet 3   sacubitril-valsartan (ENTRESTO) 97-103 MG Take 1 tablet by mouth 2 (two) times daily. 180 tablet 3   traMADol (ULTRAM) 50 MG tablet Take 1-2 tablets (50-100 mg total) by mouth at bedtime as needed. 20 tablet 0   vitamin B-12 (CYANOCOBALAMIN) 1000 MCG tablet Take 1,000 mcg by mouth daily.     No current facility-administered medications on file prior to visit.    Allergies  Allergen Reactions   Spironolactone Other (See Comments)    hyperkalemia    Past Medical History:  Diagnosis Date   Arthritis    Automatic implantable cardioverter-defibrillator in situ    COPD (chronic obstructive pulmonary disease) (HCC)    emphysema by CXR   Coronary artery disease    Diabetes mellitus    type 2  no meds   Diverticulosis of colon    GERD (gastroesophageal reflux disease)  with esophagitis   History of colonic polyps    Hypertension    Non-ischemic cardiomyopathy (HCC)    Presence of permanent cardiac pacemaker    PVD (peripheral vascular disease) (HCC)    bilateral common iliac artery aneurysms. Right SFA occlusion over a long segment. Left SFA disease with occlusion of the left TP trunk. Artirogram Oct. 2006   Sleep apnea    Tobacco abuse    Urinary incontinence    detrussor instability    Past Surgical History:  Procedure Laterality Date   ABDOMINAL AORTIC ENDOVASCULAR STENT GRAFT N/A 05/07/2018   Procedure: ABDOMINAL AORTIC ENDOVASCULAR STENT GRAFT;  Surgeon: Fransisco Hertz, MD;  Location: [facility] OR;  Service: Vascular;  Laterality: N/A;   CARDIAC CATHETERIZATION  06/2004    negative   CARDIAC CATHETERIZATION  02/08/2020   CLIPPING OF ATRIAL APPENDAGE N/A 02/11/2020   Procedure: Clipping Of Atrial Appendage using AtriCure 35 MM AtriClip.;  Surgeon: Linden Dolin, MD;  Location: [facility] OR;  Service: Open Heart Surgery;  Laterality: N/A;   COLONOSCOPY  04/2005   COPD exacerbation     CORONARY ANGIOPLASTY  01/09/2018   CORONARY ARTERY BYPASS GRAFT N/A 02/11/2020   Procedure: CORONARY ARTERY BYPASS GRAFTING (CABG) using LIMA to LAD; RIMA to PL; Endoscopic right greater saphenous vein harvest: SVC to Diag; SVC to OM1.;  Surgeon: Linden Dolin, MD;  Location: [facility] OR;  Service: Open Heart Surgery;  Laterality: N/A;  BILATERAL IMA   CORONARY STENT INTERVENTION N/A 01/10/2018   Procedure: CORONARY STENT INTERVENTION;  Surgeon: Runell Gess, MD;  Location: [facility] INVASIVE CV LAB;  Service: Cardiovascular;  Laterality: N/A;   CORONARY/GRAFT ACUTE MI REVASCULARIZATION N/A 01/10/2018   Procedure: Coronary/Graft Acute MI Revascularization;  Surgeon: Runell Gess, MD;  Location: [facility] INVASIVE CV LAB;  Service: Cardiovascular;  Laterality: N/A;   EMBOLIZATION Left 03/19/2018   EMBOLIZATION Left 03/19/2018   Procedure: EMBOLIZATION - Left Internal;  Surgeon: Fransisco Hertz, MD;  Location: [facility] INVASIVE CV LAB;  Service: Cardiovascular;  Laterality: Left;   EMBOLIZATION Right 04/16/2018   Procedure: EMBOLIZATION;  Surgeon: Fransisco Hertz, MD;  Location: [facility] INVASIVE CV LAB;  Service: Cardiovascular;  Laterality: Right;   ENDOVEIN HARVEST OF GREATER SAPHENOUS VEIN Right 02/11/2020   Procedure: Mack Guise Of Greater Saphenous Vein using right lower extremity.;  Surgeon: Linden Dolin, MD;  Location: [facility] OR;  Service: Open Heart Surgery;  Laterality: Right;   FEMORAL ARTERY EXPLORATION Right 05/07/2018   Procedure: FEMORAL ARTERY EXPLORATION, EXTENDED PROFUNDAPLASTY;  Surgeon: Fransisco Hertz, MD;  Location: [facility] OR;  Service: Vascular;  Laterality: Right;   INTRAOPERATIVE ARTERIOGRAM  Right 05/07/2018   Procedure: INTRA OPERATIVE ARTERIOGRAM;  Surgeon: Fransisco Hertz, MD;  Location: [facility] OR;  Service: Vascular;  Laterality: Right;   LEFT HEART CATH AND CORONARY ANGIOGRAPHY N/A 01/10/2018   Procedure: LEFT HEART CATH AND CORONARY ANGIOGRAPHY;  Surgeon: Runell Gess, MD;  Location: [facility] INVASIVE CV LAB;  Service: Cardiovascular;  Laterality: N/A;   LEFT HEART CATH AND CORONARY ANGIOGRAPHY N/A 02/08/2020   Procedure: LEFT HEART CATH AND CORONARY ANGIOGRAPHY;  Surgeon: Lennette Bihari, MD;  Location: [facility] INVASIVE CV LAB;  Service: Cardiovascular;  Laterality: N/A;   PACEMAKER INSERTION  11/2005   PACEMAKER LEAD REMOVAL N/A 10/17/2014   Procedure: PACEMAKER LEAD REMOVAL;  Surgeon: Marinus Maw, MD;  Location: [facility] OR;  Service: Cardiovascular;  Laterality: N/A;   RENAL ANGIOGRAPHY Left 04/16/2018   Procedure: RENAL ANGIOGRAPHY;  Surgeon:  Fransisco Hertz, MD;  Location: [facility] INVASIVE CV LAB;  Service: Cardiovascular;  Laterality: Left;   RIGHT/LEFT HEART CATH AND CORONARY/GRAFT ANGIOGRAPHY N/A 07/31/2022   Procedure: RIGHT/LEFT HEART CATH AND CORONARY/GRAFT ANGIOGRAPHY;  Surgeon: Kathleene Hazel, MD;  Location: [facility] INVASIVE CV LAB;  Service: Cardiovascular;  Laterality: N/A;   s/p ICD placement      Medtronic Maximo [phone] single chamber   TEE WITHOUT CARDIOVERSION N/A 02/11/2020   Procedure: TRANSESOPHAGEAL ECHOCARDIOGRAM (TEE);  Surgeon: Linden Dolin, MD;  Location: [facility] OR;  Service: Open Heart Surgery;  Laterality: N/A;    Family History  Problem Relation Age of Onset   Stroke Father    Coronary artery disease Maternal Aunt    Heart failure Maternal Aunt    Lung cancer Maternal Aunt    Lung cancer Maternal Uncle    Cancer Brother        Mouth   Cancer Sister        throat    Social History   Socioeconomic History   Marital status: Married    Spouse name: Not on file   Number of children: 2   Years of education: Not on file   Highest education level: 9th grade   Occupational History   Occupation: Grave digger--now disabled  Tobacco Use   Smoking status: Former    Current packs/day: 0.00    Average packs/day: 2.5 packs/day for 40.0 years (100.0 ttl pk-yrs)    Types: Cigarettes    Start date: 01/09/1978    Quit date: 01/09/2018    Years since quitting: 5.5    Passive exposure: Never   Smokeless tobacco: Never   Tobacco comments:    GAVE 1-800-QUIT-NOW  Vaping Use   Vaping status: Never Used  Substance and Sexual Activity   Alcohol use: No   Drug use: No   Sexual activity: Not Currently  Other Topics Concern   Not on file  Social History Narrative   No living will   Requests wife as health care POA--alternate is daughter or son   Would accept resuscitation but doesn't want prolonged ventilation   No tube feeds if cognitively unaware   Social Determinants of Health   Financial Resource Strain: Low Risk  (06/09/2023)   Overall Financial Resource Strain (CARDIA)    Difficulty of Paying Living Expenses: Not very hard  Food Insecurity: No Food Insecurity (06/09/2023)   Hunger Vital Sign    Worried About Running Out of Food in the Last Year: Never true    Ran Out of Food in the Last Year: Never true  Transportation Needs: No Transportation Needs (06/09/2023)   PRAPARE - Administrator, Civil Service (Medical): No    Lack of Transportation (Non-Medical): No  Physical Activity: Unknown (06/09/2023)   Exercise Vital Sign    Days of Exercise per Week: 0 days    Minutes of Exercise per Session: Not on file  Stress: No Stress Concern Present (06/09/2023)   [employer] of Occupational Health - Occupational Stress Questionnaire    Feeling of Stress : Not at all  Social Connections: Unknown (06/09/2023)   Social Connection and Isolation Panel [NHANES]    Frequency of Communication with Friends and Family: More than three times a week    Frequency of Social Gatherings with Friends and Family: Patient declined    Attends Religious  Services: Patient declined    Database administrator or Organizations: No    Attends Engineer, structural: Not  on file    Marital Status: Married  Catering manager Violence: Not on file   Review of Systems No abdominal pain No N/V     Objective:   Physical Exam Constitutional:      Appearance: Normal appearance.  Abdominal:     Palpations: Abdomen is soft.     Tenderness: There is no abdominal tenderness.  Musculoskeletal:     Comments: No spine tenderness Tenderness just to right of lumbar spine Pain with internal rotation of right hip--internal rotation okay SLR negative  Neurological:     Mental Status: He is alert.     Comments: Antalgic gait Weak symmetric legs            Assessment & Plan:

## 2023-07-24 NOTE — Addendum Note (Signed)
Addended by: Eual Fines on: 07/24/2023 12:12 PM   Modules accepted: Orders

## 2023-08-05 ENCOUNTER — Encounter: Payer: Self-pay | Admitting: Pharmacist

## 2023-08-05 NOTE — Progress Notes (Signed)
Pharmacy Quality Measure Review  This patient is appearing on a report for being at risk of failing the adherence measure for cholesterol (statin) medications this calendar year.   Medication: atorvastatin 80 mg  Last fill date: 8/1 for 90 day supply, picked up 8/5  Insurance report was not up to date. No action needed at this time.   Catie Eppie Gibson, PharmD, BCACP, CPP Clinical Pharmacist [facility] [phone]

## 2023-08-11 ENCOUNTER — Encounter: Payer: Self-pay | Admitting: Internal Medicine

## 2023-08-11 MED ORDER — TIZANIDINE HCL 2 MG PO TABS: 2.0000 mg | ORAL_TABLET | Freq: Three times a day (TID) | ORAL | 1 refills | Status: DC | PRN

## 2023-08-27 ENCOUNTER — Encounter: Payer: Self-pay | Admitting: Orthopaedic Surgery

## 2023-08-27 ENCOUNTER — Ambulatory Visit (INDEPENDENT_AMBULATORY_CARE_PROVIDER_SITE_OTHER): Payer: HMO | Admitting: Orthopaedic Surgery

## 2023-08-27 ENCOUNTER — Other Ambulatory Visit: Payer: Self-pay

## 2023-08-27 DIAGNOSIS — M1712 Unilateral primary osteoarthritis, left knee: Secondary | ICD-10-CM

## 2023-08-27 DIAGNOSIS — M1711 Unilateral primary osteoarthritis, right knee: Secondary | ICD-10-CM | POA: Diagnosis not present

## 2023-08-27 MED ORDER — BREZTRI AEROSPHERE 160-9-4.8 MCG/ACT IN AERO: 2.0000 | INHALATION_SPRAY | Freq: Two times a day (BID) | RESPIRATORY_TRACT | 9 refills | Status: AC

## 2023-08-27 NOTE — Progress Notes (Signed)
HPI: [name] returns today status post bilateral knee viscosupplementation injections.  He states the injections were very helpful.  He is really having no pain in either knee.  He states he continues to ambulate with a rolling walker due to his back.  He did try doing the leg lifts in a couple of times.  Physical exam: General Well-developed well-nourished pleasant male in no acute distress. Bilateral knees: Full range of motion.  No abnormal warmth erythema or effusion.  Patellofemoral crepitus with passive range of motion both knees.  Impression: Bilateral knee osteoarthritis  Plan: Recommend he work on Dance movement psychotherapist.  Reviewed quad strengthening with him again.  He understands to wait 3 months between cortisone injections.  Would only give 1 cortisone injection at a time due to the fact that it does raise his glucose levels.  Also would need to wait 6 months between viscosupplementation injections.  Questions were encouraged and answered at length.  Follow-up as needed.

## 2023-09-22 ENCOUNTER — Other Ambulatory Visit: Payer: Self-pay | Admitting: Internal Medicine

## 2023-09-22 MED ORDER — ALBUTEROL SULFATE HFA 108 (90 BASE) MCG/ACT IN AERS: 2.0000 | INHALATION_SPRAY | Freq: Four times a day (QID) | RESPIRATORY_TRACT | 0 refills | Status: DC | PRN

## 2023-10-02 ENCOUNTER — Ambulatory Visit (INDEPENDENT_AMBULATORY_CARE_PROVIDER_SITE_OTHER): Payer: HMO

## 2023-10-02 DIAGNOSIS — Z23 Encounter for immunization: Secondary | ICD-10-CM

## 2023-10-10 ENCOUNTER — Other Ambulatory Visit: Payer: Self-pay

## 2023-10-10 DIAGNOSIS — Z8679 Personal history of other diseases of the circulatory system: Secondary | ICD-10-CM

## 2023-10-10 DIAGNOSIS — I739 Peripheral vascular disease, unspecified: Secondary | ICD-10-CM

## 2023-10-10 DIAGNOSIS — I723 Aneurysm of iliac artery: Secondary | ICD-10-CM

## 2023-10-14 ENCOUNTER — Other Ambulatory Visit: Payer: Self-pay | Admitting: Internal Medicine

## 2023-10-20 ENCOUNTER — Other Ambulatory Visit: Payer: Self-pay | Admitting: Vascular Surgery

## 2023-10-20 DIAGNOSIS — Z8679 Personal history of other diseases of the circulatory system: Secondary | ICD-10-CM

## 2023-10-20 DIAGNOSIS — I739 Peripheral vascular disease, unspecified: Secondary | ICD-10-CM

## 2023-10-23 ENCOUNTER — Ambulatory Visit (HOSPITAL_COMMUNITY)
Admission: RE | Admit: 2023-10-23 | Discharge: 2023-10-23 | Disposition: A | Discharge status: Home / Self Care | Payer: HMO | Source: Ambulatory Visit | Attending: Vascular Surgery | Admitting: Vascular Surgery

## 2023-10-23 ENCOUNTER — Ambulatory Visit (INDEPENDENT_AMBULATORY_CARE_PROVIDER_SITE_OTHER): Payer: HMO | Admitting: Physician Assistant

## 2023-10-23 ENCOUNTER — Ambulatory Visit (INDEPENDENT_AMBULATORY_CARE_PROVIDER_SITE_OTHER)
Admission: RE | Admit: 2023-10-23 | Discharge: 2023-10-23 | Disposition: A | Discharge status: Home / Self Care | Payer: HMO | Source: Ambulatory Visit | Attending: Vascular Surgery | Admitting: Vascular Surgery

## 2023-10-23 VITALS — BP 122/76 | HR 68 | Temp 97.7°F | Wt 273.6 lb

## 2023-10-23 DIAGNOSIS — I739 Peripheral vascular disease, unspecified: Secondary | ICD-10-CM | POA: Insufficient documentation

## 2023-10-23 DIAGNOSIS — Z9889 Other specified postprocedural states: Secondary | ICD-10-CM

## 2023-10-23 DIAGNOSIS — Z8679 Personal history of other diseases of the circulatory system: Secondary | ICD-10-CM

## 2023-10-23 DIAGNOSIS — I723 Aneurysm of iliac artery: Secondary | ICD-10-CM

## 2023-10-23 LAB — VAS US ABI WITH/WO TBI
Left ABI: 0.7
Right ABI: 0.77

## 2023-10-23 NOTE — Progress Notes (Signed)
HISTORY AND PHYSICAL     CC:  follow up. For EVAR Requesting Provider:  Karie Schwalbe, MD  HPI: This is a 67 y.o. male who is here today for follow up for AAA and is s/p EVAR and the case was complicated by right lower extremity acute limb ischemia requiring femoral endarterectomy profundoplasty with bovine pericardial patch.  05/08/2018 by Dr. Imogene Burn.  He did have embolization of the left IIA on 03/19/2018, embolization of the right IIA on 04/16/2018.  Pt was last seen 08/09/2022 by Dr. Karin Lieu and at that time,  he was still having some claudication and was unchanged since 2019.  He also endorsed back pain, SOB as he could make one trip from the house to the car and then back into his home before needing to sit down due to claudication, shortness of breath, back pain.   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  He grew up in [place] and moved to [place] at age 72.  He is here with his wife of 37 years.   He has hx of CAD with CABG, COPD, OSA.  The pt returns today for follow up studies.  He states he has fallen several times.  He feels like his legs give out on him.  He denies any pain in his feet.  He gets occasional cramping in his legs with walking but is more limited by his back pain and shortness of breath.   He states he has been told by the back doctor that he has deteriorating discs in his back.  He has had an ESI in the past before covid but did not help.  He also has arthritis in his knees and received injection with relief.    The pt is on a statin for cholesterol management.    The pt is on an aspirin.    Other AC:  Plavix The pt is on ARB, diuretic, BB for hypertension.  The pt is  on medication for diabetes. Tobacco hx:  former  Past Medical History:  Diagnosis Date   Arthritis    Automatic implantable cardioverter-defibrillator in situ    COPD (chronic obstructive pulmonary disease) (HCC)    emphysema by CXR   Coronary artery disease    Diabetes mellitus    type 2  no meds    Diverticulosis of colon    GERD (gastroesophageal reflux disease)    with esophagitis   History of colonic polyps    Hypertension    Non-ischemic cardiomyopathy (HCC)    Presence of permanent cardiac pacemaker    PVD (peripheral vascular disease) (HCC)    bilateral common iliac artery aneurysms. Right SFA occlusion over a long segment. Left SFA disease with occlusion of the left TP trunk. Artirogram Oct. 2006   Sleep apnea    Tobacco abuse    Urinary incontinence    detrussor instability    Past Surgical History:  Procedure Laterality Date   ABDOMINAL AORTIC ENDOVASCULAR STENT GRAFT N/A 05/07/2018   Procedure: ABDOMINAL AORTIC ENDOVASCULAR STENT GRAFT;  Surgeon: Fransisco Hertz, MD;  Location: [facility] OR;  Service: Vascular;  Laterality: N/A;   CARDIAC CATHETERIZATION  06/2004   negative   CARDIAC CATHETERIZATION  02/08/2020   CLIPPING OF ATRIAL APPENDAGE N/A 02/11/2020   Procedure: Clipping Of Atrial Appendage using AtriCure 35 MM AtriClip.;  Surgeon: Linden Dolin, MD;  Location: [facility] OR;  Service: Open Heart Surgery;  Laterality: N/A;   COLONOSCOPY  04/2005   COPD exacerbation  CORONARY ANGIOPLASTY  01/09/2018   CORONARY ARTERY BYPASS GRAFT N/A 02/11/2020   Procedure: CORONARY ARTERY BYPASS GRAFTING (CABG) using LIMA to LAD; RIMA to PL; Endoscopic right greater saphenous vein harvest: SVC to Diag; SVC to OM1.;  Surgeon: Linden Dolin, MD;  Location: [facility] OR;  Service: Open Heart Surgery;  Laterality: N/A;  BILATERAL IMA   CORONARY STENT INTERVENTION N/A 01/10/2018   Procedure: CORONARY STENT INTERVENTION;  Surgeon: Runell Gess, MD;  Location: [facility] INVASIVE CV LAB;  Service: Cardiovascular;  Laterality: N/A;   CORONARY/GRAFT ACUTE MI REVASCULARIZATION N/A 01/10/2018   Procedure: Coronary/Graft Acute MI Revascularization;  Surgeon: Runell Gess, MD;  Location: [facility] INVASIVE CV LAB;  Service: Cardiovascular;  Laterality: N/A;   EMBOLIZATION Left 03/19/2018   EMBOLIZATION (CATH  LAB) Left 03/19/2018   Procedure: EMBOLIZATION - Left Internal;  Surgeon: Fransisco Hertz, MD;  Location: [facility] INVASIVE CV LAB;  Service: Cardiovascular;  Laterality: Left;   EMBOLIZATION (CATH LAB) Right 04/16/2018   Procedure: EMBOLIZATION;  Surgeon: Fransisco Hertz, MD;  Location: [facility] INVASIVE CV LAB;  Service: Cardiovascular;  Laterality: Right;   ENDOVEIN HARVEST OF GREATER SAPHENOUS VEIN Right 02/11/2020   Procedure: Mack Guise Of Greater Saphenous Vein using right lower extremity.;  Surgeon: Linden Dolin, MD;  Location: [facility] OR;  Service: Open Heart Surgery;  Laterality: Right;   FEMORAL ARTERY EXPLORATION Right 05/07/2018   Procedure: FEMORAL ARTERY EXPLORATION, EXTENDED PROFUNDAPLASTY;  Surgeon: Fransisco Hertz, MD;  Location: [facility] OR;  Service: Vascular;  Laterality: Right;   INTRAOPERATIVE ARTERIOGRAM Right 05/07/2018   Procedure: INTRA OPERATIVE ARTERIOGRAM;  Surgeon: Fransisco Hertz, MD;  Location: [facility] OR;  Service: Vascular;  Laterality: Right;   LEFT HEART CATH AND CORONARY ANGIOGRAPHY N/A 01/10/2018   Procedure: LEFT HEART CATH AND CORONARY ANGIOGRAPHY;  Surgeon: Runell Gess, MD;  Location: [facility] INVASIVE CV LAB;  Service: Cardiovascular;  Laterality: N/A;   LEFT HEART CATH AND CORONARY ANGIOGRAPHY N/A 02/08/2020   Procedure: LEFT HEART CATH AND CORONARY ANGIOGRAPHY;  Surgeon: Lennette Bihari, MD;  Location: [facility] INVASIVE CV LAB;  Service: Cardiovascular;  Laterality: N/A;   PACEMAKER INSERTION  11/2005   PACEMAKER LEAD REMOVAL N/A 10/17/2014   Procedure: PACEMAKER LEAD REMOVAL;  Surgeon: Marinus Maw, MD;  Location: [facility] OR;  Service: Cardiovascular;  Laterality: N/A;   RENAL ANGIOGRAPHY Left 04/16/2018   Procedure: RENAL ANGIOGRAPHY;  Surgeon: Fransisco Hertz, MD;  Location: [facility] INVASIVE CV LAB;  Service: Cardiovascular;  Laterality: Left;   RIGHT/LEFT HEART CATH AND CORONARY/GRAFT ANGIOGRAPHY N/A 07/31/2022   Procedure: RIGHT/LEFT HEART CATH AND CORONARY/GRAFT ANGIOGRAPHY;  Surgeon: Kathleene Hazel, MD;  Location: [facility] INVASIVE CV LAB;  Service: Cardiovascular;  Laterality: N/A;   s/p ICD placement      Medtronic Maximo [phone] single chamber   TEE WITHOUT CARDIOVERSION N/A 02/11/2020   Procedure: TRANSESOPHAGEAL ECHOCARDIOGRAM (TEE);  Surgeon: Linden Dolin, MD;  Location: [facility] OR;  Service: Open Heart Surgery;  Laterality: N/A;    Allergies  Allergen Reactions   Spironolactone Other (See Comments)    hyperkalemia    Current Outpatient Medications  Medication Sig Dispense Refill   albuterol (VENTOLIN HFA) 108 (90 Base) MCG/ACT inhaler TAKE 2 PUFFS BY MOUTH EVERY 6 HOURS AS NEEDED FOR WHEEZE OR SHORTNESS OF BREATH 18 each 0   aspirin 81 MG chewable tablet Chew 1 tablet (81 mg total) by mouth daily.     atorvastatin (LIPITOR) 80 MG tablet TAKE 1 TABLET BY MOUTH  ONCE A DAY AT SIXIN THE EVENING 90 tablet 3   Budeson-Glycopyrrol-Formoterol (BREZTRI AEROSPHERE) 160-9-4.8 MCG/ACT AERO Inhale 2 puffs into the lungs in the morning and at bedtime. 5 each 9   clopidogrel (PLAVIX) 75 MG tablet TAKE 1 TABLET BY MOUTH EVERY DAY 90 tablet 3   diclofenac Sodium (VOLTAREN) 1 % GEL Apply 1 Application topically 3 (three) times daily as needed (pain).     empagliflozin (JARDIANCE) 25 MG TABS tablet Take 1 tablet (25 mg total) by mouth daily. 90 tablet 3   fluticasone (FLONASE) 50 MCG/ACT nasal spray SPRAY 1 SPRAY INTO BOTH NOSTRILS DAILY 16 g 2   furosemide (LASIX) 20 MG tablet TAKE ONE TABLET BY MOUTH ONCE A DAY 90 tablet 3   gabapentin (NEURONTIN) 100 MG capsule TAKE 1 CAPSULE BY MOUTH 3 TIMES DAILY 90 capsule 3   glipiZIDE (GLUCOTROL) 10 MG tablet Take 1 tablet (10 mg total) by mouth 2 (two) times daily before a meal. 180 tablet 3   isosorbide mononitrate (IMDUR) 30 MG 24 hr tablet TAKE ONE TABLET BY MOUTH ONCE EVERY EVENING 90 tablet 3   metFORMIN (GLUCOPHAGE-XR) 500 MG 24 hr tablet TAKE 2 TABLETS BY MOUTH EVERY DAY WITH BREAKFAST 180 tablet 3   metoprolol tartrate (LOPRESSOR) 25 MG  tablet TAKE 1 TABLET BY MOUTH 2 TIMES A DAY 180 tablet 3   Multiple Vitamin (MULTIVITAMIN WITH MINERALS) TABS tablet Take 1 tablet by mouth daily.     nitroGLYCERIN (NITROSTAT) 0.4 MG SL tablet Place 0.4 mg under the tongue every 5 (five) minutes as needed for chest pain.     pantoprazole (PROTONIX) 40 MG tablet TAKE ONE TABLET BY MOUTH TWICE A DAY 180 tablet 3   sacubitril-valsartan (ENTRESTO) 97-103 MG Take 1 tablet by mouth 2 (two) times daily. 180 tablet 3   tiZANidine (ZANAFLEX) 2 MG tablet Take 1 tablet (2 mg total) by mouth 3 (three) times daily as needed for muscle spasms. 30 tablet 1   vitamin B-12 (CYANOCOBALAMIN) 1000 MCG tablet Take 1,000 mcg by mouth daily.     No current facility-administered medications for this visit.    Family History  Problem Relation Age of Onset   Stroke Father    Coronary artery disease Maternal Aunt    Heart failure Maternal Aunt    Lung cancer Maternal Aunt    Lung cancer Maternal Uncle    Cancer Brother        Mouth   Cancer Sister        throat    Social History   Socioeconomic History   Marital status: Married    Spouse name: Not on file   Number of children: 2   Years of education: Not on file   Highest education level: 9th grade  Occupational History   Occupation: Grave digger--now disabled  Tobacco Use   Smoking status: Former    Current packs/day: 0.00    Average packs/day: 2.5 packs/day for 40.0 years (100.0 ttl pk-yrs)    Types: Cigarettes    Start date: 01/09/1978    Quit date: 01/09/2018    Years since quitting: 5.7    Passive exposure: Never   Smokeless tobacco: Never   Tobacco comments:    GAVE 1-800-QUIT-NOW  Vaping Use   Vaping status: Never Used  Substance and Sexual Activity   Alcohol use: No   Drug use: No   Sexual activity: Not Currently  Other Topics Concern   Not on file  Social History Narrative  No living will   Requests wife as health care POA--alternate is daughter or son   Would accept  resuscitation but doesn't want prolonged ventilation   No tube feeds if cognitively unaware   Social Determinants of Health   Financial Resource Strain: Low Risk  (06/09/2023)   Overall Financial Resource Strain (CARDIA)    Difficulty of Paying Living Expenses: Not very hard  Food Insecurity: No Food Insecurity (06/09/2023)   Hunger Vital Sign    Worried About Running Out of Food in the Last Year: Never true    Ran Out of Food in the Last Year: Never true  Transportation Needs: No Transportation Needs (06/09/2023)   PRAPARE - Administrator, Civil Service (Medical): No    Lack of Transportation (Non-Medical): No  Physical Activity: Unknown (06/09/2023)   Exercise Vital Sign    Days of Exercise per Week: 0 days    Minutes of Exercise per Session: Not on file  Stress: No Stress Concern Present (06/09/2023)   [employer] of Occupational Health - Occupational Stress Questionnaire    Feeling of Stress : Not at all  Social Connections: Unknown (06/09/2023)   Social Connection and Isolation Panel [NHANES]    Frequency of Communication with Friends and Family: More than three times a week    Frequency of Social Gatherings with Friends and Family: Patient declined    Attends Religious Services: Patient declined    Database administrator or Organizations: No    Attends Engineer, structural: Not on file    Marital Status: Married  Catering manager Violence: Not on file     REVIEW OF SYSTEMS:   [X]  denotes positive finding, [ ]  denotes negative finding Cardiac  Comments:  Chest pain or chest pressure:    Shortness of breath upon exertion: x   Short of breath when lying flat: x   Irregular heart rhythm:        Vascular    Pain in calf, thigh, or hip brought on by ambulation: x   Pain in feet at night that wakes you up from your sleep:     Blood clot in your veins:    Leg swelling:         Pulmonary    Oxygen at home:    Productive cough:  x   Wheezing:          Neurologic    weakness in legs:  x   Sudden numbness in arms or legs:     Sudden onset of difficulty speaking or slurred speech:    Temporary loss of vision in one eye:     Problems with dizziness:         Gastrointestinal    Blood in stool:     Vomited blood:         Genitourinary    Burning when urinating:     Blood in urine:        Psychiatric    Major depression:         Hematologic    Bleeding problems:    Problems with blood clotting too easily:        Skin    Rashes or ulcers:        Constitutional    Fever or chills:      PHYSICAL EXAMINATION:  Today's Vitals   10/23/23 1024  BP: 122/76  Pulse: 68  Temp: 97.7 F (36.5 C)  TempSrc: Temporal  SpO2: 95%  Weight: 273  lb 9.6 oz (124.1 kg)   Body mass index is 39.26 kg/m.   General:  WDWN in NAD; vital signs documented above Gait: Not observed HENT: WNL, normocephalic Pulmonary: normal non-labored breathing  Cardiac: regular HR;  without carotid bruits Abdomen: soft, NT; aortic pulse is not palpable Skin: without rashes Vascular Exam/Pulses:  Right Left  Radial 2+ (normal) 2+ (normal)  Femoral Difficulty palpating due to body habitus Difficulty palpating due to body habitus  Popliteal Unable to palpate Unable to palpate  DP monophasic monophasic  PT Brisk monophasic monophasic  Peroneal monophasic Brisk monophasic   Extremities: without open wounds Musculoskeletal: no muscle wasting or atrophy  Neurologic: A&O X 3 Psychiatric:  The pt has Normal affect.   Non-Invasive Vascular Imaging:   EVAR Arterial duplex on 10/23/2023: 5.8cm largest diameter with possible endoleak.  ABI's/TBI's on 10/23/2023: Right:  0.77/0.56 - Great toe pressure: 71 Left:  0.70/0.72 - Great toe pressure: 91  Previous EVAR arterial duplex on 07/05/2022: Maximum diameter 5.72 without endoleak  Previous ABI's/TBI's on 07/05/2022: Right:  0.76/0.40 - Great toe pressure: 51 Left:  0.60/0.40 - Great toe pressure:   52    ASSESSMENT/PLAN:: 67 y.o. male here with hx of EVAR with open exposure and repair of left CFA on  05/08/2018 by Dr. Imogene Burn.  He did have embolization of the left IIA on 03/19/2018, embolization of the right IIA on 04/16/2018.  -pt ABI essentially unchanged and has monophasic signals bilaterally in DP/PT/pero.   -he has had some falls recently where his legs haven given out.  He has hx of back issues in the past and was being followed but then covid came and he was lost to follow up.  Given his hx of back issues and recent falls, will refer him to [facility], which is where he would like to go to have his back pain evaluated.  We will put in referral.   -is duplex today reveals that the size of the aortic sac is essentially unchanged with possible endoleak.  Discussed with Dr. Karin Lieu and given the size is unchanged and has not been visualized in the past, will have him return in one year with repeat studies.  He and his wife know to call sooner if there are any issues before then.   -continue asa/statin/plavix    Doreatha Massed, [facility] [phone]  Clinic MD:   Karin Lieu

## 2023-10-30 DIAGNOSIS — G4733 Obstructive sleep apnea (adult) (pediatric): Secondary | ICD-10-CM | POA: Diagnosis not present

## 2023-11-03 ENCOUNTER — Encounter: Payer: Self-pay | Admitting: Physician Assistant

## 2023-11-03 ENCOUNTER — Ambulatory Visit (INDEPENDENT_AMBULATORY_CARE_PROVIDER_SITE_OTHER): Payer: HMO | Admitting: Physician Assistant

## 2023-11-03 ENCOUNTER — Other Ambulatory Visit (INDEPENDENT_AMBULATORY_CARE_PROVIDER_SITE_OTHER): Payer: HMO

## 2023-11-03 DIAGNOSIS — M545 Low back pain, unspecified: Secondary | ICD-10-CM | POA: Diagnosis not present

## 2023-11-03 DIAGNOSIS — G8929 Other chronic pain: Secondary | ICD-10-CM | POA: Diagnosis not present

## 2023-11-03 NOTE — Progress Notes (Signed)
HPI: [name] comes in today due to low back pain.  He has had midline back pain that is been ongoing for years.  Mostly on the right side.  Denies any saddle anesthesia, weight loss, bowel or bladder dysfunction.  Pain does awaken him.  States pain is severe at times 10 out of 10 but he has constant pain and is unable to get comfortable.  He has had history of ESI's but is unsure of when and where he is gave him no relief.  He has tried therapy in the past but this made his pain worse.  He does have diabetic neuropathy.  Last hemoglobin A1c was 8.3.  He is on chronic Plavix.  He has peripheral vascular disease history of stent placements in the past.  Review of systems: Denies any fevers chills.  Physical exam: General Well-developed well-nourished male no acute distress.   Psych: Alert and oriented x 3 Lower extremities: 5 out of 5 strength throughout the lower extremities against resistance.  Good range of motion bilateral hips without pain.  Positive straight leg raise on the left negative on the right.  Bilateral feet no rashes skin lesions or impending ulcers.  Feet are warm and dry.  Radiographs AP and lateral views lumbar spine show scoliosis.  No acute fractures acute findings.  Anterior vertebral spurring lower thoracic and upper lumbar region.  Facet changes lower lumbar region.  Prior abdominal aortic aneurysm and iliac stenting in place.  Impression: Lumbar pain without radiculopathy  Plan patient is unable to take NSAIDs at this point in time given the fact he is on Plavix.  Has poorly controlled diabetes and is unable to go on Medrol Dosepak.  He has on Neurontin and is tried Zanaflex and other muscle relaxers without any real relief.  He has failed physical therapy.  Recommend MRI of the lumbar spine for epidural steroid planning.   Follow-up after the MRI to go over results and discuss further treatment.

## 2023-11-04 ENCOUNTER — Other Ambulatory Visit: Payer: Self-pay | Admitting: Radiology

## 2023-11-07 ENCOUNTER — Other Ambulatory Visit: Payer: Self-pay

## 2023-11-07 DIAGNOSIS — I739 Peripheral vascular disease, unspecified: Secondary | ICD-10-CM

## 2023-11-24 ENCOUNTER — Encounter: Payer: Self-pay | Admitting: Internal Medicine

## 2023-11-24 ENCOUNTER — Ambulatory Visit (INDEPENDENT_AMBULATORY_CARE_PROVIDER_SITE_OTHER): Payer: HMO | Admitting: Internal Medicine

## 2023-11-24 VITALS — BP 124/78 | HR 64 | Temp 97.9°F | Ht 70.0 in | Wt 277.0 lb

## 2023-11-24 DIAGNOSIS — Z7985 Long-term (current) use of injectable non-insulin antidiabetic drugs: Secondary | ICD-10-CM | POA: Diagnosis not present

## 2023-11-24 DIAGNOSIS — F39 Unspecified mood [affective] disorder: Secondary | ICD-10-CM | POA: Diagnosis not present

## 2023-11-24 DIAGNOSIS — G8929 Other chronic pain: Secondary | ICD-10-CM | POA: Diagnosis not present

## 2023-11-24 DIAGNOSIS — I25118 Atherosclerotic heart disease of native coronary artery with other forms of angina pectoris: Secondary | ICD-10-CM | POA: Diagnosis not present

## 2023-11-24 DIAGNOSIS — E1151 Type 2 diabetes mellitus with diabetic peripheral angiopathy without gangrene: Secondary | ICD-10-CM | POA: Diagnosis not present

## 2023-11-24 DIAGNOSIS — M545 Low back pain, unspecified: Secondary | ICD-10-CM

## 2023-11-24 DIAGNOSIS — Z23 Encounter for immunization: Secondary | ICD-10-CM | POA: Diagnosis not present

## 2023-11-24 DIAGNOSIS — Z7984 Long term (current) use of oral hypoglycemic drugs: Secondary | ICD-10-CM | POA: Diagnosis not present

## 2023-11-24 LAB — POCT GLYCOSYLATED HEMOGLOBIN (HGB A1C): Hemoglobin A1C: 7.8 % — AB (ref 4.0–5.6)

## 2023-11-24 MED ORDER — OZEMPIC (0.25 OR 0.5 MG/DOSE) 2 MG/3ML ~~LOC~~ SOPN: 0.2500 mg | PEN_INJECTOR | SUBCUTANEOUS | 1 refills | Status: DC

## 2023-11-24 NOTE — Assessment & Plan Note (Signed)
Very frustrated and in pain Consider duloxetine

## 2023-11-24 NOTE — Progress Notes (Signed)
Subjective:    Patient ID: [name], male    DOB: Sep 11, 1956, 67 y.o.   MRN: [id number]  HPI Here for follow up of diabetes and other chronic health conditions  Recent vascular evaluation was okay Sent to back specialist---diagnosed with scoliosis, disc deterioration and arthritis Has MRI scheduled tomorrow Muscle relaxer didn't help Tough getting up in the morning--hard to even get to his recliner. Takes 4 tylenol and it finally improves some Really limiting him a lot--can't even carry anything Has fallen some---knows his weight is an issue also  Some trouble with his sugars--even trying to cut out meals, etc Usually sugars in 270 range ---even fasting No calorie drinks Wonders about trying ozempic   Occasional chest pain--hasn't used nitroglycerin DOE--uses the inhaler at times No edema Some lightheadedness--no syncope\  BMI stable at 39  Current Outpatient Medications on File Prior to Visit  Medication Sig Dispense Refill   albuterol (VENTOLIN HFA) 108 (90 Base) MCG/ACT inhaler TAKE 2 PUFFS BY MOUTH EVERY 6 HOURS AS NEEDED FOR WHEEZE OR SHORTNESS OF BREATH 18 each 0   aspirin 81 MG chewable tablet Chew 1 tablet (81 mg total) by mouth daily.     atorvastatin (LIPITOR) 80 MG tablet TAKE 1 TABLET BY MOUTH ONCE A DAY AT SIXIN THE EVENING 90 tablet 3   Budeson-Glycopyrrol-Formoterol (BREZTRI AEROSPHERE) 160-9-4.8 MCG/ACT AERO Inhale 2 puffs into the lungs in the morning and at bedtime. 5 each 9   clopidogrel (PLAVIX) 75 MG tablet TAKE ONE TABLET BY MOUTH EVERY DAY 90 tablet 3   diclofenac Sodium (VOLTAREN) 1 % GEL Apply 1 Application topically 3 (three) times daily as needed (pain).     empagliflozin (JARDIANCE) 25 MG TABS tablet Take 1 tablet (25 mg total) by mouth daily. 90 tablet 3   fluticasone (FLONASE) 50 MCG/ACT nasal spray SPRAY 1 SPRAY INTO BOTH NOSTRILS DAILY 16 g 2   furosemide (LASIX) 20 MG tablet TAKE ONE TABLET BY MOUTH ONCE A DAY 90 tablet 3   gabapentin  (NEURONTIN) 100 MG capsule TAKE 1 CAPSULE BY MOUTH 3 TIMES DAILY 90 capsule 3   glipiZIDE (GLUCOTROL) 10 MG tablet Take 1 tablet (10 mg total) by mouth 2 (two) times daily before a meal. 180 tablet 3   isosorbide mononitrate (IMDUR) 30 MG 24 hr tablet TAKE ONE TABLET BY MOUTH ONCE EVERY EVENING 90 tablet 3   metFORMIN (GLUCOPHAGE-XR) 500 MG 24 hr tablet TAKE 2 TABLETS BY MOUTH EVERY DAY WITH BREAKFAST 180 tablet 3   metoprolol tartrate (LOPRESSOR) 25 MG tablet TAKE 1 TABLET BY MOUTH 2 TIMES A DAY 180 tablet 3   Multiple Vitamin (MULTIVITAMIN WITH MINERALS) TABS tablet Take 1 tablet by mouth daily.     nitroGLYCERIN (NITROSTAT) 0.4 MG SL tablet Place 0.4 mg under the tongue every 5 (five) minutes as needed for chest pain.     pantoprazole (PROTONIX) 40 MG tablet TAKE ONE TABLET BY MOUTH TWICE A DAY 180 tablet 3   sacubitril-valsartan (ENTRESTO) 97-103 MG Take 1 tablet by mouth 2 (two) times daily. 180 tablet 3   vitamin B-12 (CYANOCOBALAMIN) 1000 MCG tablet Take 1,000 mcg by mouth daily.     No current facility-administered medications on file prior to visit.    Allergies  Allergen Reactions   Spironolactone Other (See Comments)    hyperkalemia    Past Medical History:  Diagnosis Date   Arthritis    Automatic implantable cardioverter-defibrillator in situ    COPD (chronic obstructive pulmonary disease) (  HCC)    emphysema by CXR   Coronary artery disease    Diabetes mellitus    type 2  no meds   Diverticulosis of colon    GERD (gastroesophageal reflux disease)    with esophagitis   History of colonic polyps    Hypertension    Non-ischemic cardiomyopathy (HCC)    Presence of permanent cardiac pacemaker    PVD (peripheral vascular disease) (HCC)    bilateral common iliac artery aneurysms. Right SFA occlusion over a long segment. Left SFA disease with occlusion of the left TP trunk. Artirogram Oct. 2006   Sleep apnea    Tobacco abuse    Urinary incontinence    detrussor  instability    Past Surgical History:  Procedure Laterality Date   ABDOMINAL AORTIC ENDOVASCULAR STENT GRAFT N/A 05/07/2018   Procedure: ABDOMINAL AORTIC ENDOVASCULAR STENT GRAFT;  Surgeon: Fransisco Hertz, MD;  Location: [facility] OR;  Service: Vascular;  Laterality: N/A;   CARDIAC CATHETERIZATION  06/2004   negative   CARDIAC CATHETERIZATION  02/08/2020   CLIPPING OF ATRIAL APPENDAGE N/A 02/11/2020   Procedure: Clipping Of Atrial Appendage using AtriCure 35 MM AtriClip.;  Surgeon: Linden Dolin, MD;  Location: [facility] OR;  Service: Open Heart Surgery;  Laterality: N/A;   COLONOSCOPY  04/2005   COPD exacerbation     CORONARY ANGIOPLASTY  01/09/2018   CORONARY ARTERY BYPASS GRAFT N/A 02/11/2020   Procedure: CORONARY ARTERY BYPASS GRAFTING (CABG) using LIMA to LAD; RIMA to PL; Endoscopic right greater saphenous vein harvest: SVC to Diag; SVC to OM1.;  Surgeon: Linden Dolin, MD;  Location: [facility] OR;  Service: Open Heart Surgery;  Laterality: N/A;  BILATERAL IMA   CORONARY STENT INTERVENTION N/A 01/10/2018   Procedure: CORONARY STENT INTERVENTION;  Surgeon: Runell Gess, MD;  Location: [facility] INVASIVE CV LAB;  Service: Cardiovascular;  Laterality: N/A;   CORONARY/GRAFT ACUTE MI REVASCULARIZATION N/A 01/10/2018   Procedure: Coronary/Graft Acute MI Revascularization;  Surgeon: Runell Gess, MD;  Location: [facility] INVASIVE CV LAB;  Service: Cardiovascular;  Laterality: N/A;   EMBOLIZATION Left 03/19/2018   EMBOLIZATION (CATH LAB) Left 03/19/2018   Procedure: EMBOLIZATION - Left Internal;  Surgeon: Fransisco Hertz, MD;  Location: [facility] INVASIVE CV LAB;  Service: Cardiovascular;  Laterality: Left;   EMBOLIZATION (CATH LAB) Right 04/16/2018   Procedure: EMBOLIZATION;  Surgeon: Fransisco Hertz, MD;  Location: [facility] INVASIVE CV LAB;  Service: Cardiovascular;  Laterality: Right;   ENDOVEIN HARVEST OF GREATER SAPHENOUS VEIN Right 02/11/2020   Procedure: Mack Guise Of Greater Saphenous Vein using right lower extremity.;   Surgeon: Linden Dolin, MD;  Location: [facility] OR;  Service: Open Heart Surgery;  Laterality: Right;   FEMORAL ARTERY EXPLORATION Right 05/07/2018   Procedure: FEMORAL ARTERY EXPLORATION, EXTENDED PROFUNDAPLASTY;  Surgeon: Fransisco Hertz, MD;  Location: [facility] OR;  Service: Vascular;  Laterality: Right;   INTRAOPERATIVE ARTERIOGRAM Right 05/07/2018   Procedure: INTRA OPERATIVE ARTERIOGRAM;  Surgeon: Fransisco Hertz, MD;  Location: [facility] OR;  Service: Vascular;  Laterality: Right;   LEFT HEART CATH AND CORONARY ANGIOGRAPHY N/A 01/10/2018   Procedure: LEFT HEART CATH AND CORONARY ANGIOGRAPHY;  Surgeon: Runell Gess, MD;  Location: [facility] INVASIVE CV LAB;  Service: Cardiovascular;  Laterality: N/A;   LEFT HEART CATH AND CORONARY ANGIOGRAPHY N/A 02/08/2020   Procedure: LEFT HEART CATH AND CORONARY ANGIOGRAPHY;  Surgeon: Lennette Bihari, MD;  Location: [facility] INVASIVE CV LAB;  Service: Cardiovascular;  Laterality: N/A;   PACEMAKER INSERTION  11/2005   PACEMAKER LEAD REMOVAL N/A 10/17/2014   Procedure: PACEMAKER LEAD REMOVAL;  Surgeon: Marinus Maw, MD;  Location: [facility] OR;  Service: Cardiovascular;  Laterality: N/A;   RENAL ANGIOGRAPHY Left 04/16/2018   Procedure: RENAL ANGIOGRAPHY;  Surgeon: Fransisco Hertz, MD;  Location: [facility] INVASIVE CV LAB;  Service: Cardiovascular;  Laterality: Left;   RIGHT/LEFT HEART CATH AND CORONARY/GRAFT ANGIOGRAPHY N/A 07/31/2022   Procedure: RIGHT/LEFT HEART CATH AND CORONARY/GRAFT ANGIOGRAPHY;  Surgeon: Kathleene Hazel, MD;  Location: [facility] INVASIVE CV LAB;  Service: Cardiovascular;  Laterality: N/A;   s/p ICD placement      Medtronic Maximo [phone] single chamber   TEE WITHOUT CARDIOVERSION N/A 02/11/2020   Procedure: TRANSESOPHAGEAL ECHOCARDIOGRAM (TEE);  Surgeon: Linden Dolin, MD;  Location: [facility] OR;  Service: Open Heart Surgery;  Laterality: N/A;    Family History  Problem Relation Age of Onset   Stroke Father    Coronary artery disease Maternal Aunt    Heart failure Maternal Aunt     Lung cancer Maternal Aunt    Lung cancer Maternal Uncle    Cancer Brother        Mouth   Cancer Sister        throat    Social History   Socioeconomic History   Marital status: Married    Spouse name: Not on file   Number of children: 2   Years of education: Not on file   Highest education level: 9th grade  Occupational History   Occupation: Grave digger--now disabled  Tobacco Use   Smoking status: Former    Current packs/day: 0.00    Average packs/day: 2.5 packs/day for 40.0 years (100.0 ttl pk-yrs)    Types: Cigarettes    Start date: 01/09/1978    Quit date: 01/09/2018    Years since quitting: 5.8    Passive exposure: Never   Smokeless tobacco: Never   Tobacco comments:    GAVE 1-800-QUIT-NOW  Vaping Use   Vaping status: Never Used  Substance and Sexual Activity   Alcohol use: No   Drug use: No   Sexual activity: Not Currently  Other Topics Concern   Not on file  Social History Narrative   No living will   Requests wife as health care POA--alternate is daughter or son   Would accept resuscitation but doesn't want prolonged ventilation   No tube feeds if cognitively unaware   Social Determinants of Health   Financial Resource Strain: Low Risk  (11/23/2023)   Overall Financial Resource Strain (CARDIA)    Difficulty of Paying Living Expenses: Not very hard  Food Insecurity: No Food Insecurity (11/23/2023)   Hunger Vital Sign    Worried About Running Out of Food in the Last Year: Never true    Ran Out of Food in the Last Year: Never true  Transportation Needs: No Transportation Needs (11/23/2023)   PRAPARE - Administrator, Civil Service (Medical): No    Lack of Transportation (Non-Medical): No  Physical Activity: Unknown (11/23/2023)   Exercise Vital Sign    Days of Exercise per Week: 0 days    Minutes of Exercise per Session: Not on file  Stress: No Stress Concern Present (06/09/2023)   [employer] of Occupational Health - Occupational Stress  Questionnaire    Feeling of Stress : Not at all  Social Connections: Unknown (11/23/2023)   Social Connection and Isolation Panel [NHANES]    Frequency of Communication with Friends and Family: Patient declined  Frequency of Social Gatherings with Friends and Family: Patient declined    Attends Religious Services: Never    Database administrator or Organizations: No    Attends Engineer, structural: Not on file    Marital Status: Married  Catering manager Violence: Not on file   Review of Systems No trouble with bladder or bowel control Does have urinary frequency Awakens hourly due to back pain     Objective:   Physical Exam Constitutional:      Appearance: Normal appearance.  Cardiovascular:     Rate and Rhythm: Normal rate and regular rhythm.     Heart sounds: No murmur heard.    No gallop.     Comments: Feet warm without palpable pulses Pulmonary:     Effort: Pulmonary effort is normal.     Breath sounds: Normal breath sounds. No wheezing or rales.  Musculoskeletal:     Cervical back: Neck supple.     Right lower leg: No edema.     Left lower leg: No edema.  Lymphadenopathy:     Cervical: No cervical adenopathy.  Skin:    Comments: No foot lesions  Neurological:     Mental Status: He is alert.            Assessment & Plan:

## 2023-11-24 NOTE — Assessment & Plan Note (Signed)
Occ angina on the imdur 30mg  ASA and atorvastatin 80mg  daily Metoprolol 25 bid and entresto 97/103 bid

## 2023-11-24 NOTE — Assessment & Plan Note (Signed)
A1c slightly better at 7.8% On glipizide 10, jardiance 25 and metformin 1000 daily Will add ozempic 0.25mg  weekly and titrate up as tolerated Cut or stop the glipizide if sugars are low

## 2023-11-24 NOTE — Assessment & Plan Note (Signed)
BMI still 39 with DM, CAD, vascular disease, etc Will try ozempic

## 2023-11-24 NOTE — Assessment & Plan Note (Signed)
Complex etiology but pain is now severe Has MRI tomorrow--hopefully can try ESI Consider gabapentin or even narcotics depending on results

## 2023-11-24 NOTE — Patient Instructions (Signed)
Please get the COVID and RSV vaccines at the pharmacy

## 2023-11-24 NOTE — Addendum Note (Signed)
Addended by: Eual Fines on: 11/24/2023 10:33 AM   Modules accepted: Orders

## 2023-11-25 ENCOUNTER — Inpatient Hospital Stay
Admission: RE | Admit: 2023-11-25 | Discharge: 2023-11-25 | Disposition: A | Discharge status: Home / Self Care | Payer: HMO | Source: Ambulatory Visit | Attending: Physician Assistant | Admitting: Physician Assistant

## 2023-11-25 DIAGNOSIS — M47816 Spondylosis without myelopathy or radiculopathy, lumbar region: Secondary | ICD-10-CM | POA: Diagnosis not present

## 2023-11-25 DIAGNOSIS — G8929 Other chronic pain: Secondary | ICD-10-CM

## 2023-11-25 DIAGNOSIS — M51362 Other intervertebral disc degeneration, lumbar region with discogenic back pain and lower extremity pain: Secondary | ICD-10-CM | POA: Diagnosis not present

## 2023-11-26 ENCOUNTER — Encounter: Payer: Self-pay | Admitting: Internal Medicine

## 2023-12-01 ENCOUNTER — Other Ambulatory Visit (HOSPITAL_COMMUNITY): Payer: Self-pay

## 2023-12-01 ENCOUNTER — Telehealth: Payer: Self-pay

## 2023-12-01 NOTE — Telephone Encounter (Signed)
Pharmacy Patient Advocate Encounter   Received notification from Pt Calls Messages that prior authorization for Ozempic (0.25 or 0.5 MG/DOSE) 2MG /3ML pen-injectors is required/requested.   Insurance verification completed.   The patient is insured through [facility] ADVANTAGE/RX ADVANCE .   Per test claim: PA required and submitted KEY/EOC/Request #: [id number] APPROVED from 12/01/23 to 11/30/24. Ran test claim, Copay is $47. This test claim was processed through [facility]- copay amounts may vary at other pharmacies due to pharmacy/plan contracts, or as the patient moves through the different stages of their insurance plan.

## 2023-12-03 ENCOUNTER — Other Ambulatory Visit (HOSPITAL_COMMUNITY): Payer: Self-pay

## 2023-12-15 ENCOUNTER — Encounter: Payer: Self-pay | Admitting: Physician Assistant

## 2023-12-15 ENCOUNTER — Ambulatory Visit: Payer: HMO | Admitting: Physician Assistant

## 2023-12-15 DIAGNOSIS — M545 Low back pain, unspecified: Secondary | ICD-10-CM

## 2023-12-15 DIAGNOSIS — G8929 Other chronic pain: Secondary | ICD-10-CM | POA: Diagnosis not present

## 2023-12-15 MED ORDER — TRAMADOL HCL 50 MG PO TABS: 50.0000 mg | ORAL_TABLET | Freq: Four times a day (QID) | ORAL | 0 refills | Status: DC | PRN

## 2023-12-15 NOTE — Progress Notes (Signed)
HPI: [name] returns today to go over the MRI of his lumbar spine.  Again he is having low back pain.  Does not feel that he has any radicular symptoms but he does have neuropathy of the lower extremities.  He states there has been no change in his overall condition or symptoms.  Most of the pain is in the low back and radiates to the right side. MRI lumbar spine is reviewed with the patient images reviewed and also a lumbar spine model was used to demonstrate the anatomy.  MRI dated December 07, 2023 shows L2-3 disc degeneration with circumferential disc bulge.  Mild narrowing the lateral recesses but no compression noted.  Discogenic endplate marrow changes noted.  L4-5 facet arthritic changes with joint effusion.  Narrowing lateral recesses and neuroforamina mild.  L5-S1 disc bulge with bilateral foraminal narrowing worse on the left.  Does appear to irritate the L5 nerve.  Impression: Low back pain without radicular symptoms.  Recommend epidural steroid injections questionable facet injection.  Will see him back 4 to 6 weeks after the injections to see what type of response he had.  Questions were encouraged and answered at length.

## 2023-12-18 ENCOUNTER — Other Ambulatory Visit: Payer: Self-pay | Admitting: Radiology

## 2023-12-18 DIAGNOSIS — M545 Low back pain, unspecified: Secondary | ICD-10-CM

## 2023-12-20 ENCOUNTER — Other Ambulatory Visit: Payer: Self-pay | Admitting: Physician Assistant

## 2023-12-26 ENCOUNTER — Other Ambulatory Visit: Payer: Self-pay | Admitting: Physician Assistant

## 2023-12-30 ENCOUNTER — Encounter: Payer: Self-pay | Admitting: Internal Medicine

## 2023-12-30 DIAGNOSIS — E1151 Type 2 diabetes mellitus with diabetic peripheral angiopathy without gangrene: Secondary | ICD-10-CM

## 2023-12-31 ENCOUNTER — Other Ambulatory Visit: Payer: Self-pay | Admitting: Physician Assistant

## 2023-12-31 MED ORDER — LANCET DEVICE MISC: 1.0000 | Freq: Three times a day (TID) | 0 refills | Status: AC

## 2023-12-31 MED ORDER — BLOOD GLUCOSE TEST VI STRP: 1.0000 | ORAL_STRIP | Freq: Three times a day (TID) | 0 refills | Status: AC

## 2023-12-31 MED ORDER — OZEMPIC (0.25 OR 0.5 MG/DOSE) 2 MG/3ML ~~LOC~~ SOPN: 0.5000 mg | PEN_INJECTOR | SUBCUTANEOUS | 1 refills | Status: DC

## 2023-12-31 MED ORDER — BLOOD GLUCOSE MONITORING SUPPL DEVI: 1.0000 | Freq: Three times a day (TID) | 0 refills | Status: AC

## 2023-12-31 MED ORDER — LANCETS MISC. MISC: 1.0000 | Freq: Three times a day (TID) | 1 refills | Status: AC

## 2023-12-31 NOTE — Addendum Note (Signed)
 Addended by: Dorris Fetch on: 12/31/2023 04:38 PM   Modules accepted: Orders

## 2023-12-31 NOTE — Telephone Encounter (Signed)
 Please send glucometer and supplies for him

## 2024-01-08 ENCOUNTER — Other Ambulatory Visit: Payer: Self-pay

## 2024-01-08 ENCOUNTER — Ambulatory Visit (INDEPENDENT_AMBULATORY_CARE_PROVIDER_SITE_OTHER): Payer: HMO | Admitting: Physical Medicine and Rehabilitation

## 2024-01-08 DIAGNOSIS — M47816 Spondylosis without myelopathy or radiculopathy, lumbar region: Secondary | ICD-10-CM | POA: Diagnosis not present

## 2024-01-08 MED ORDER — METHYLPREDNISOLONE ACETATE 40 MG/ML IJ SUSP: 40.0000 mg | Freq: Once | INTRAMUSCULAR | Status: DC

## 2024-01-08 NOTE — Patient Instructions (Signed)

## 2024-01-08 NOTE — Progress Notes (Signed)
Functional Pain Scale - descriptive words and definitions  Unmanageable (7)  Pain interferes with normal ADL's/nothing seems to help/sleep is very difficult/active distractions are very difficult to concentrate on. Severe range order  Average Pain 7

## 2024-01-09 ENCOUNTER — Other Ambulatory Visit: Payer: Self-pay | Admitting: Internal Medicine

## 2024-01-12 ENCOUNTER — Encounter: Payer: Self-pay | Admitting: Cardiology

## 2024-01-13 MED ORDER — EMPAGLIFLOZIN 25 MG PO TABS: 25.0000 mg | ORAL_TABLET | Freq: Every day | ORAL | 3 refills | Status: DC

## 2024-01-13 MED ORDER — SACUBITRIL-VALSARTAN 97-103 MG PO TABS: 1.0000 | ORAL_TABLET | Freq: Two times a day (BID) | ORAL | 3 refills | Status: DC

## 2024-01-14 NOTE — Progress Notes (Signed)
[name] - 68 y.o. male MRN [id number]  Date of birth: Apr 07, 1956  Office Visit Note: Visit Date: 01/08/2024 PCP: Karie Schwalbe, MD Referred by: Karie Schwalbe, MD  Subjective: Chief Complaint  Patient presents with   Lower Back - Pain    bil L4-L5 & L5-S1 MBB planned   HPI:  [name] is a 68 y.o. male who comes in today at the request of Rexene Edison, PA-C for planned Bilateral  L4-5 and L5-S1 Lumbar facet/medial branch block with fluoroscopic guidance.  The patient has failed conservative care including home exercise, medications, time and activity modification.  This injection will be diagnostic and hopefully therapeutic.  Please see requesting physician notes for further details and justification.  Exam has shown concordant pain with facet joint loading.   ROS Otherwise per HPI.  Assessment & Plan: Visit Diagnoses:    ICD-10-CM   1. Spondylosis without myelopathy or radiculopathy, lumbar region  M47.816 XR C-ARM NO REPORT    Facet Injection    methylPREDNISolone acetate (DEPO-MEDROL) injection 40 mg      Plan: No additional findings.   Meds & Orders:  Meds ordered this encounter  Medications   methylPREDNISolone acetate (DEPO-MEDROL) injection 40 mg    Orders Placed This Encounter  Procedures   Facet Injection   XR C-ARM NO REPORT    Follow-up: Return for visit to requesting provider as needed.   Procedures: No procedures performed  Lumbar Facet Joint Intra-Articular Injection(s) with Fluoroscopic Guidance  Patient: [name]      Date of Birth: 17-Sep-1956 MRN: [id number] PCP: Karie Schwalbe, MD      Visit Date: 01/08/2024   Universal Protocol:    Date/Time: 01/08/2024  Consent Given By: the patient  Position: PRONE   Additional Comments: Vital signs were monitored before and after the procedure. Patient was prepped and draped in the usual sterile fashion. The correct patient, procedure, and site was verified.   Injection  Procedure Details:  Procedure Site One Meds Administered:  Meds ordered this encounter  Medications   methylPREDNISolone acetate (DEPO-MEDROL) injection 40 mg     Laterality: Bilateral  Location/Site:  L4-L5 L5-S1  Needle size: 22 guage  Needle type: Spinal  Needle Placement: Articular  Findings:  -Comments: Excellent flow of contrast producing a partial arthrogram.  Procedure Details: The fluoroscope beam is vertically oriented in AP, and the inferior recess is visualized beneath the lower pole of the inferior apophyseal process, which represents the target point for needle insertion. When direct visualization is difficult the target point is located at the medial projection of the vertebral pedicle. The region overlying each aforementioned target is locally anesthetized with a 1 to 2 ml. volume of 1% Lidocaine without Epinephrine.   The spinal needle was inserted into each of the above mentioned facet joints using biplanar fluoroscopic guidance. A 0.25 to 0.5 ml. volume of Isovue-250 was injected and a partial facet joint arthrogram was obtained. A single spot film was obtained of the resulting arthrogram.    One to 1.25 ml of the steroid/anesthetic solution was then injected into each of the facet joints noted above.   Additional Comments:  The patient tolerated the procedure well Dressing: 2 x 2 sterile gauze and Band-Aid    Post-procedure details: Patient was observed during the procedure. Post-procedure instructions were reviewed.  Patient left the clinic in stable condition.    Clinical History: Narrative & Impression CLINICAL DATA:  Spinal stenosis, lumbar. Low back  pain with right leg radicular symptoms over the last 3 months.   EXAM: MRI LUMBAR SPINE WITHOUT CONTRAST   TECHNIQUE: Multiplanar, multisequence MR imaging of the lumbar spine was performed. No intravenous contrast was administered.   COMPARISON:  Radiography 11/03/2023    FINDINGS: Segmentation:  5 lumbar type vertebral bodies assumed.   Alignment:  Minimal scoliotic curvature convex to the left.   Vertebrae: Discogenic edematous changes of the endplates at L2-3 which could relate to regional pain.   Conus medullaris and cauda equina: Conus extends to the L1 level. Conus and cauda equina appear normal.   Paraspinal and other soft tissues: Previous aortoiliac aneurysms with stent graft treatment. Not primarily or completely evaluated.   Disc levels:   T12-L1 and L1-2: Mild noncompressive disc bulges.   L2-3: Disc degeneration with circumferential bulging. Focal anterior right paracentral herniation. Mild narrowing of the lateral recesses but no visible neural compression. As noted above, discogenic endplate marrow changes could relate to regional pain.   L3-4: Minimal disc bulge and facet hypertrophy.  No stenosis.   L4-5: Endplate osteophytes and bulging of the disc. Bilateral facet degeneration and hypertrophy with joint effusions. Stenosis of both lateral recesses and neural foramina that could be symptomatic. This appearance could worsen with standing or flexion.   L5-S1: Mild bulging of the disc. Bilateral facet osteoarthritis. No central canal stenosis. Bilateral foraminal narrowing, worse on the left. Some potential the left L5 nerve could be irritated as it exits the narrowed foramen.   IMPRESSION: 1. L2-3: Disc degeneration with circumferential bulging. Focal anterior right paracentral herniation. Mild narrowing of the lateral recesses but no visible neural compression. Discogenic endplate marrow changes could relate to regional pain. 2. L4-5: Endplate osteophytes and bulging of the disc. Bilateral facet degeneration and hypertrophy with joint effusions. Stenosis of both lateral recesses and neural foramina that could be symptomatic. This appearance could worsen with standing or flexion. The facet arthropathy could additionally be  a cause of back pain or referred facet syndrome pain. 3. L5-S1: Disc bulge. Bilateral facet osteoarthritis. Bilateral foraminal narrowing, worse on the left. Some potential the left L5 nerve could be irritated as it exits the narrowed foramen.     Electronically Signed   By: Paulina Fusi M.D.   On: 12/07/2023 13:52     Objective:  VS:  HT:    WT:   BMI:     BP:   HR: bpm  TEMP: ( )  RESP:  Physical Exam Vitals and nursing note reviewed.  Constitutional:      General: He is not in acute distress.    Appearance: Normal appearance. He is not ill-appearing.  HENT:     Head: Normocephalic and atraumatic.     Right Ear: External ear normal.     Left Ear: External ear normal.     Nose: No congestion.  Eyes:     Extraocular Movements: Extraocular movements intact.  Cardiovascular:     Rate and Rhythm: Normal rate.     Pulses: Normal pulses.  Pulmonary:     Effort: Pulmonary effort is normal. No respiratory distress.  Abdominal:     General: There is no distension.     Palpations: Abdomen is soft.  Musculoskeletal:        General: No tenderness or signs of injury.     Cervical back: Neck supple.     Right lower leg: No edema.     Left lower leg: No edema.     Comments:  Patient has good distal strength without clonus.  Skin:    Findings: No erythema or rash.  Neurological:     General: No focal deficit present.     Mental Status: He is alert and oriented to person, place, and time.     Sensory: No sensory deficit.     Motor: No weakness or abnormal muscle tone.     Coordination: Coordination normal.  Psychiatric:        Mood and Affect: Mood normal.        Behavior: Behavior normal.      Imaging: No results found.

## 2024-01-14 NOTE — Procedures (Signed)
Lumbar Facet Joint Intra-Articular Injection(s) with Fluoroscopic Guidance  Patient: [name]      Date of Birth: 30-Jul-1956 MRN: [id number] PCP: Karie Schwalbe, MD      Visit Date: 01/08/2024   Universal Protocol:    Date/Time: 01/08/2024  Consent Given By: the patient  Position: PRONE   Additional Comments: Vital signs were monitored before and after the procedure. Patient was prepped and draped in the usual sterile fashion. The correct patient, procedure, and site was verified.   Injection Procedure Details:  Procedure Site One Meds Administered:  Meds ordered this encounter  Medications   methylPREDNISolone acetate (DEPO-MEDROL) injection 40 mg     Laterality: Bilateral  Location/Site:  L4-L5 L5-S1  Needle size: 22 guage  Needle type: Spinal  Needle Placement: Articular  Findings:  -Comments: Excellent flow of contrast producing a partial arthrogram.  Procedure Details: The fluoroscope beam is vertically oriented in AP, and the inferior recess is visualized beneath the lower pole of the inferior apophyseal process, which represents the target point for needle insertion. When direct visualization is difficult the target point is located at the medial projection of the vertebral pedicle. The region overlying each aforementioned target is locally anesthetized with a 1 to 2 ml. volume of 1% Lidocaine without Epinephrine.   The spinal needle was inserted into each of the above mentioned facet joints using biplanar fluoroscopic guidance. A 0.25 to 0.5 ml. volume of Isovue-250 was injected and a partial facet joint arthrogram was obtained. A single spot film was obtained of the resulting arthrogram.    One to 1.25 ml of the steroid/anesthetic solution was then injected into each of the facet joints noted above.   Additional Comments:  The patient tolerated the procedure well Dressing: 2 x 2 sterile gauze and Band-Aid    Post-procedure details: Patient was  observed during the procedure. Post-procedure instructions were reviewed.  Patient left the clinic in stable condition.

## 2024-01-16 ENCOUNTER — Other Ambulatory Visit: Payer: Self-pay | Admitting: Cardiology

## 2024-01-16 ENCOUNTER — Other Ambulatory Visit: Payer: Self-pay | Admitting: Internal Medicine

## 2024-01-26 ENCOUNTER — Other Ambulatory Visit: Payer: Self-pay | Admitting: Vascular Surgery

## 2024-01-26 MED ORDER — EMPAGLIFLOZIN 25 MG PO TABS: 25.0000 mg | ORAL_TABLET | Freq: Every day | ORAL | 3 refills | Status: DC

## 2024-01-27 ENCOUNTER — Other Ambulatory Visit: Payer: Self-pay | Admitting: Internal Medicine

## 2024-01-27 ENCOUNTER — Encounter: Payer: Self-pay | Admitting: Physical Medicine and Rehabilitation

## 2024-01-27 DIAGNOSIS — M47816 Spondylosis without myelopathy or radiculopathy, lumbar region: Secondary | ICD-10-CM

## 2024-02-09 ENCOUNTER — Other Ambulatory Visit: Payer: Self-pay

## 2024-02-09 ENCOUNTER — Ambulatory Visit (INDEPENDENT_AMBULATORY_CARE_PROVIDER_SITE_OTHER): Payer: PPO | Admitting: Physical Medicine and Rehabilitation

## 2024-02-09 VITALS — BP 123/75 | HR 81

## 2024-02-09 DIAGNOSIS — M545 Low back pain, unspecified: Secondary | ICD-10-CM

## 2024-02-09 DIAGNOSIS — M47816 Spondylosis without myelopathy or radiculopathy, lumbar region: Secondary | ICD-10-CM

## 2024-02-09 DIAGNOSIS — G8929 Other chronic pain: Secondary | ICD-10-CM

## 2024-02-09 NOTE — Procedures (Signed)
Lumbar Diagnostic Facet Joint Nerve Block with Fluoroscopic Guidance   Patient: [name]      Date of Birth: 1956/10/24 MRN: [id number] PCP: Karie Schwalbe, MD      Visit Date: 02/09/2024   Universal Protocol:    Date/Time: 02/08/2509:45 AM  Consent Given By: the patient  Position: PRONE  Additional Comments: Vital signs were monitored before and after the procedure. Patient was prepped and draped in the usual sterile fashion. The correct patient, procedure, and site was verified.   Injection Procedure Details:   Procedure diagnoses:  1. Spondylosis without myelopathy or radiculopathy, lumbar region   2. Chronic midline low back pain without sciatica      Meds Administered: 0.5% Marcaine   Laterality: Bilateral  Location/Site: L4-L5, L3 and L4 medial branches and L5-S1, L4 medial branch and L5 dorsal ramus  Needle: 5.0 in., 25 ga.  Short bevel or Quincke spinal needle  Needle Placement: Oblique pedical  Findings:   -Comments: There was excellent flow of contrast along the articular pillars without intravascular flow.  Procedure Details: The fluoroscope beam is vertically oriented in AP and then obliqued 15 to 20 degrees to the ipsilateral side of the desired nerve to achieve the "Scotty dog" appearance.  The skin over the target area of the junction of the superior articulating process and the transverse process (sacral ala if blocking the L5 dorsal rami) was locally anesthetized with a 1 ml volume of 1% Lidocaine without Epinephrine.  The spinal needle was inserted and advanced in a trajectory view down to the target.   After contact with periosteum and negative aspirate for blood and CSF, correct placement without intravascular or epidural spread was confirmed by injecting 0.5 ml. of Isovue-250.  A spot radiograph was obtained of this image.    Next, a 0.5 ml. volume of the injectate described above was injected. The needle was then redirected to the other  facet joint nerves mentioned above if needed.  Prior to the procedure, the patient was given a Pain Diary which was completed for baseline measurements.  After the procedure, the patient rated their pain every 30 minutes and will continue rating at this frequency for a total of 5 hours.  The patient has been asked to complete the Diary and return to [place] by mail, fax or hand delivered as soon as possible.   Additional Comments:  No complications occurred Dressing: 2 x 2 sterile gauze and Band-Aid    Post-procedure details: Patient was observed during the procedure. Post-procedure instructions were reviewed.  Patient left the clinic in stable condition.

## 2024-02-09 NOTE — Progress Notes (Signed)
[name] - 68 y.o. male MRN [id number]  Date of birth: 05/17/1956  Office Visit Note: Visit Date: 02/09/2024 PCP: Karie Schwalbe, MD Referred by: Karie Schwalbe, MD  Subjective: Chief Complaint  Patient presents with   Lower Back - Pain   HPI:  [name] is a 68 y.o. male who comes in today for planned repeat Bilateral L4-5 and L5-S1 Lumbar facet/medial branch block with fluoroscopic guidance.  The patient has failed conservative care including home exercise, medications, time and activity modification.  This injection will be diagnostic and hopefully therapeutic.  Please see requesting physician notes for further details and justification.  Exam shows concordant low back pain with facet joint loading and extension. Patient received more than 80% pain relief from prior injection. This would be the second block in a diagnostic double block paradigm.     Referring:Megan Mayford Knife, FNP and Rexene Edison, PA-C   ROS Otherwise per HPI.  Assessment & Plan: Visit Diagnoses:    ICD-10-CM   1. Spondylosis without myelopathy or radiculopathy, lumbar region  M47.816 XR C-ARM NO REPORT    Nerve Block    2. Chronic midline low back pain without sciatica  M54.50 XR C-ARM NO REPORT   G89.29 Nerve Block      Plan: No additional findings.   Meds & Orders: No orders of the defined types were placed in this encounter.   Orders Placed This Encounter  Procedures   Nerve Block   XR C-ARM NO REPORT    Follow-up: Return if symptoms worsen or fail to improve, for If pain relief then RFA.   Procedures: No procedures performed  Lumbar Diagnostic Facet Joint Nerve Block with Fluoroscopic Guidance   Patient: [name]      Date of Birth: [birth year] MRN: [id number] PCP: Karie Schwalbe, MD      Visit Date: 02/09/2024   Universal Protocol:    Date/Time: 02/08/2509:45 AM  Consent Given By: the patient  Position: PRONE  Additional Comments: Vital signs were monitored  before and after the procedure. Patient was prepped and draped in the usual sterile fashion. The correct patient, procedure, and site was verified.   Injection Procedure Details:   Procedure diagnoses:  1. Spondylosis without myelopathy or radiculopathy, lumbar region   2. Chronic midline low back pain without sciatica      Meds Administered: 0.5% Marcaine   Laterality: Bilateral  Location/Site: L4-L5, L3 and L4 medial branches and L5-S1, L4 medial branch and L5 dorsal ramus  Needle: 5.0 in., 25 ga.  Short bevel or Quincke spinal needle  Needle Placement: Oblique pedical  Findings:   -Comments: There was excellent flow of contrast along the articular pillars without intravascular flow.  Procedure Details: The fluoroscope beam is vertically oriented in AP and then obliqued 15 to 20 degrees to the ipsilateral side of the desired nerve to achieve the "Scotty dog" appearance.  The skin over the target area of the junction of the superior articulating process and the transverse process (sacral ala if blocking the L5 dorsal rami) was locally anesthetized with a 1 ml volume of 1% Lidocaine without Epinephrine.  The spinal needle was inserted and advanced in a trajectory view down to the target.   After contact with periosteum and negative aspirate for blood and CSF, correct placement without intravascular or epidural spread was confirmed by injecting 0.5 ml. of Isovue-250.  A spot radiograph was obtained of this image.    Next, a 0.5 ml.  volume of the injectate described above was injected. The needle was then redirected to the other facet joint nerves mentioned above if needed.  Prior to the procedure, the patient was given a Pain Diary which was completed for baseline measurements.  After the procedure, the patient rated their pain every 30 minutes and will continue rating at this frequency for a total of 5 hours.  The patient has been asked to complete the Diary and return to [place] by mail,  fax or hand delivered as soon as possible.   Additional Comments:  No complications occurred Dressing: 2 x 2 sterile gauze and Band-Aid    Post-procedure details: Patient was observed during the procedure. Post-procedure instructions were reviewed.  Patient left the clinic in stable condition.   Clinical History: Narrative & Impression CLINICAL DATA:  Spinal stenosis, lumbar. Low back pain with right leg radicular symptoms over the last 3 months.   EXAM: MRI LUMBAR SPINE WITHOUT CONTRAST   TECHNIQUE: Multiplanar, multisequence MR imaging of the lumbar spine was performed. No intravenous contrast was administered.   COMPARISON:  Radiography 11/03/2023   FINDINGS: Segmentation:  5 lumbar type vertebral bodies assumed.   Alignment:  Minimal scoliotic curvature convex to the left.   Vertebrae: Discogenic edematous changes of the endplates at L2-3 which could relate to regional pain.   Conus medullaris and cauda equina: Conus extends to the L1 level. Conus and cauda equina appear normal.   Paraspinal and other soft tissues: Previous aortoiliac aneurysms with stent graft treatment. Not primarily or completely evaluated.   Disc levels:   T12-L1 and L1-2: Mild noncompressive disc bulges.   L2-3: Disc degeneration with circumferential bulging. Focal anterior right paracentral herniation. Mild narrowing of the lateral recesses but no visible neural compression. As noted above, discogenic endplate marrow changes could relate to regional pain.   L3-4: Minimal disc bulge and facet hypertrophy.  No stenosis.   L4-5: Endplate osteophytes and bulging of the disc. Bilateral facet degeneration and hypertrophy with joint effusions. Stenosis of both lateral recesses and neural foramina that could be symptomatic. This appearance could worsen with standing or flexion.   L5-S1: Mild bulging of the disc. Bilateral facet osteoarthritis. No central canal stenosis. Bilateral foraminal  narrowing, worse on the left. Some potential the left L5 nerve could be irritated as it exits the narrowed foramen.   IMPRESSION: 1. L2-3: Disc degeneration with circumferential bulging. Focal anterior right paracentral herniation. Mild narrowing of the lateral recesses but no visible neural compression. Discogenic endplate marrow changes could relate to regional pain. 2. L4-5: Endplate osteophytes and bulging of the disc. Bilateral facet degeneration and hypertrophy with joint effusions. Stenosis of both lateral recesses and neural foramina that could be symptomatic. This appearance could worsen with standing or flexion. The facet arthropathy could additionally be a cause of back pain or referred facet syndrome pain. 3. L5-S1: Disc bulge. Bilateral facet osteoarthritis. Bilateral foraminal narrowing, worse on the left. Some potential the left L5 nerve could be irritated as it exits the narrowed foramen.     Electronically Signed   By: Paulina Fusi M.D.   On: 12/07/2023 13:52     Objective:  VS:  HT:    WT:   BMI:     BP:123/75  HR:81bpm  TEMP: ( )  RESP:  Physical Exam Vitals and nursing note reviewed.  Constitutional:      General: He is not in acute distress.    Appearance: Normal appearance. He is not ill-appearing.  HENT:  Head: Normocephalic and atraumatic.     Right Ear: External ear normal.     Left Ear: External ear normal.     Nose: No congestion.  Eyes:     Extraocular Movements: Extraocular movements intact.  Cardiovascular:     Rate and Rhythm: Normal rate.     Pulses: Normal pulses.  Pulmonary:     Effort: Pulmonary effort is normal. No respiratory distress.  Abdominal:     General: There is no distension.     Palpations: Abdomen is soft.  Musculoskeletal:        General: No tenderness or signs of injury.     Cervical back: Neck supple.     Right lower leg: No edema.     Left lower leg: No edema.     Comments: Patient has good distal strength  without clonus.  Skin:    Findings: No erythema or rash.  Neurological:     General: No focal deficit present.     Mental Status: He is alert and oriented to person, place, and time.     Sensory: No sensory deficit.     Motor: No weakness or abnormal muscle tone.     Coordination: Coordination normal.  Psychiatric:        Mood and Affect: Mood normal.        Behavior: Behavior normal.      Imaging: No results found.

## 2024-02-09 NOTE — Patient Instructions (Signed)

## 2024-02-09 NOTE — Progress Notes (Signed)
Pain Score---8 Patient advising he takes Plavix No Allergies to Contrast Dye

## 2024-02-12 ENCOUNTER — Other Ambulatory Visit: Payer: Self-pay | Admitting: Physical Medicine and Rehabilitation

## 2024-02-12 DIAGNOSIS — M47816 Spondylosis without myelopathy or radiculopathy, lumbar region: Secondary | ICD-10-CM

## 2024-02-12 DIAGNOSIS — G8929 Other chronic pain: Secondary | ICD-10-CM

## 2024-02-24 ENCOUNTER — Ambulatory Visit (INDEPENDENT_AMBULATORY_CARE_PROVIDER_SITE_OTHER): Payer: HMO | Admitting: Internal Medicine

## 2024-02-24 ENCOUNTER — Ambulatory Visit: Payer: PPO | Admitting: Physical Medicine and Rehabilitation

## 2024-02-24 ENCOUNTER — Other Ambulatory Visit: Payer: Self-pay

## 2024-02-24 ENCOUNTER — Encounter: Payer: Self-pay | Admitting: Internal Medicine

## 2024-02-24 VITALS — BP 124/74 | HR 74 | Ht 70.0 in | Wt 273.0 lb

## 2024-02-24 VITALS — BP 131/81 | HR 75

## 2024-02-24 DIAGNOSIS — M47816 Spondylosis without myelopathy or radiculopathy, lumbar region: Secondary | ICD-10-CM

## 2024-02-24 DIAGNOSIS — E1151 Type 2 diabetes mellitus with diabetic peripheral angiopathy without gangrene: Secondary | ICD-10-CM | POA: Diagnosis not present

## 2024-02-24 DIAGNOSIS — I5022 Chronic systolic (congestive) heart failure: Secondary | ICD-10-CM

## 2024-02-24 DIAGNOSIS — J439 Emphysema, unspecified: Secondary | ICD-10-CM

## 2024-02-24 DIAGNOSIS — Z7985 Long-term (current) use of injectable non-insulin antidiabetic drugs: Secondary | ICD-10-CM

## 2024-02-24 DIAGNOSIS — I739 Peripheral vascular disease, unspecified: Secondary | ICD-10-CM

## 2024-02-24 LAB — HM DIABETES FOOT EXAM

## 2024-02-24 LAB — POCT GLYCOSYLATED HEMOGLOBIN (HGB A1C): Hemoglobin A1C: 6.5 % — AB (ref 4.0–5.6)

## 2024-02-24 MED ORDER — GLIPIZIDE 5 MG PO TABS: 5.0000 mg | ORAL_TABLET | Freq: Two times a day (BID) | ORAL | 3 refills | Status: AC

## 2024-02-24 MED ORDER — METHYLPREDNISOLONE ACETATE 40 MG/ML IJ SUSP
40.0000 mg | Freq: Once | INTRAMUSCULAR | Status: AC
  Administered 2024-02-24: 40 mg

## 2024-02-24 MED ORDER — SEMAGLUTIDE (1 MG/DOSE) 4 MG/3ML ~~LOC~~ SOPN: 1.0000 mg | PEN_INJECTOR | SUBCUTANEOUS | 11 refills | Status: AC

## 2024-02-24 NOTE — Assessment & Plan Note (Signed)
 No palpable pulses but no claudication

## 2024-02-24 NOTE — Progress Notes (Unsigned)
 Pain Score-----8 Patient takes Plavix No Allergies to Contrast Dye

## 2024-02-24 NOTE — Patient Instructions (Signed)

## 2024-02-24 NOTE — Assessment & Plan Note (Signed)
 Lab Results  Component Value Date   HGBA1C 6.5 (A) 02/24/2024   Control is much better on the ozempic--will increase dose to 1mg  weekly Cut glipizide to 5 bid ---to reduce hypoglycemic risk Jardiance 25, metformin 1000 daily

## 2024-02-24 NOTE — Assessment & Plan Note (Signed)
 Compensated on entresto 97/103 bid , isosorbide 30mg , furosemide 20mg 

## 2024-02-24 NOTE — Assessment & Plan Note (Signed)
 No SOB on the breztri

## 2024-02-24 NOTE — Progress Notes (Signed)
 Subjective:    Patient ID: [name], male    DOB: 04-04-1956, 68 y.o.   MRN: [id number]  HPI Here for follow up of diabetes and chronic back pain  No problems with ozempic--still at 0.5mg  Did have low sugar reactions when he missed meals Minimal weight loss thus far Checks sugars 95- 191----but almost all under 130  Ongoing back problems Getting nerve ablation later today Not using the tramadol for now  No chest pain or SOB No palpitations Some cough lately--not sick----still on breztri No dizziness or syncope  Current Outpatient Medications on File Prior to Visit  Medication Sig Dispense Refill   albuterol (VENTOLIN HFA) 108 (90 Base) MCG/ACT inhaler TAKE 2 PUFFS BY MOUTH EVERY 6 HOURS AS NEEDED FOR WHEEZE OR SHORTNESS OF BREATH 18 each 0   aspirin 81 MG chewable tablet Chew 1 tablet (81 mg total) by mouth daily.     atorvastatin (LIPITOR) 80 MG tablet TAKE ONE TABLET BY MOUTH ONCE A DAY AT SIX IN THE EVENING 90 tablet 3   Blood Glucose Monitoring Suppl DEVI 1 each by Does not apply route in the morning, at noon, and at bedtime. May substitute to any manufacturer covered by patient's insurance. 1 each 0   Budeson-Glycopyrrol-Formoterol (BREZTRI AEROSPHERE) 160-9-4.8 MCG/ACT AERO Inhale 2 puffs into the lungs in the morning and at bedtime. 5 each 9   clopidogrel (PLAVIX) 75 MG tablet TAKE ONE TABLET BY MOUTH EVERY DAY 90 tablet 3   diclofenac Sodium (VOLTAREN) 1 % GEL Apply 1 Application topically 3 (three) times daily as needed (pain).     empagliflozin (JARDIANCE) 25 MG TABS tablet Take 1 tablet (25 mg total) by mouth daily. 90 tablet 3   fluticasone (FLONASE) 50 MCG/ACT nasal spray SPRAY 1 SPRAY INTO BOTH NOSTRILS DAILY 16 g 2   furosemide (LASIX) 20 MG tablet TAKE ONE TABLET BY MOUTH ONCE A DAY 90 tablet 3   gabapentin (NEURONTIN) 100 MG capsule TAKE ONE CAPSULE BY MOUTH THREE TIMES DAILY 90 capsule 3   glipiZIDE (GLUCOTROL) 10 MG tablet TAKE ONE TABLET BY MOUTH TWICE A  DAY BEFORE A MEAL 180 tablet 3   Glucose Blood (BLOOD GLUCOSE TEST STRIPS) STRP 1 each by In Vitro route in the morning, at noon, and at bedtime. May substitute to any manufacturer covered by patient's insurance. 270 each 0   isosorbide mononitrate (IMDUR) 30 MG 24 hr tablet TAKE ONE TABLET BY MOUTH ONCE EVERY EVENING 90 tablet 3   Lancet Device MISC 1 each by Does not apply route in the morning, at noon, and at bedtime. May substitute to any manufacturer covered by patient's insurance. 1 each 0   Lancets Misc. MISC 1 each by Does not apply route in the morning, at noon, and at bedtime. May substitute to any manufacturer covered by patient's insurance. 100 each 1   metFORMIN (GLUCOPHAGE-XR) 500 MG 24 hr tablet TAKE TWO TABLETS BY MOUTH EVERY DAY WITH BREAKFAST 180 tablet 3   metoprolol tartrate (LOPRESSOR) 25 MG tablet TAKE 1 TABLET BY MOUTH 2 TIMES A DAY 180 tablet 3   Multiple Vitamin (MULTIVITAMIN WITH MINERALS) TABS tablet Take 1 tablet by mouth daily.     nitroGLYCERIN (NITROSTAT) 0.4 MG SL tablet Place 0.4 mg under the tongue every 5 (five) minutes as needed for chest pain.     pantoprazole (PROTONIX) 40 MG tablet TAKE ONE TABLET BY MOUTH TWICE A DAY 180 tablet 3   sacubitril-valsartan (ENTRESTO) 97-103 MG Take 1  tablet by mouth 2 (two) times daily. 180 tablet 3   Semaglutide,0.25 or 0.5MG /DOS, (OZEMPIC, 0.25 OR 0.5 MG/DOSE,) 2 MG/3ML SOPN Inject 0.5 mg into the skin once a week. 3 mL 1   traMADol (ULTRAM) 50 MG tablet Take 1 tablet (50 mg total) by mouth every 6 (six) hours as needed. 30 tablet 0   vitamin B-12 (CYANOCOBALAMIN) 1000 MCG tablet Take 1,000 mcg by mouth daily.     No current facility-administered medications on file prior to visit.    Allergies  Allergen Reactions   Spironolactone Other (See Comments)    hyperkalemia    Past Medical History:  Diagnosis Date   Arthritis    Automatic implantable cardioverter-defibrillator in situ    COPD (chronic obstructive pulmonary  disease) (HCC)    emphysema by CXR   Coronary artery disease    Diabetes mellitus    type 2  no meds   Diverticulosis of colon    GERD (gastroesophageal reflux disease)    with esophagitis   History of colonic polyps    Hypertension    Non-ischemic cardiomyopathy (HCC)    Presence of permanent cardiac pacemaker    PVD (peripheral vascular disease) (HCC)    bilateral common iliac artery aneurysms. Right SFA occlusion over a long segment. Left SFA disease with occlusion of the left TP trunk. Artirogram Oct. 2006   Sleep apnea    Tobacco abuse    Urinary incontinence    detrussor instability    Past Surgical History:  Procedure Laterality Date   ABDOMINAL AORTIC ENDOVASCULAR STENT GRAFT N/A 05/07/2018   Procedure: ABDOMINAL AORTIC ENDOVASCULAR STENT GRAFT;  Surgeon: Fransisco Hertz, MD;  Location: [facility] OR;  Service: Vascular;  Laterality: N/A;   CARDIAC CATHETERIZATION  06/2004   negative   CARDIAC CATHETERIZATION  02/08/2020   CLIPPING OF ATRIAL APPENDAGE N/A 02/11/2020   Procedure: Clipping Of Atrial Appendage using AtriCure 35 MM AtriClip.;  Surgeon: Linden Dolin, MD;  Location: [facility] OR;  Service: Open Heart Surgery;  Laterality: N/A;   COLONOSCOPY  04/2005   COPD exacerbation     CORONARY ANGIOPLASTY  01/09/2018   CORONARY ARTERY BYPASS GRAFT N/A 02/11/2020   Procedure: CORONARY ARTERY BYPASS GRAFTING (CABG) using LIMA to LAD; RIMA to PL; Endoscopic right greater saphenous vein harvest: SVC to Diag; SVC to OM1.;  Surgeon: Linden Dolin, MD;  Location: [facility] OR;  Service: Open Heart Surgery;  Laterality: N/A;  BILATERAL IMA   CORONARY STENT INTERVENTION N/A 01/10/2018   Procedure: CORONARY STENT INTERVENTION;  Surgeon: Runell Gess, MD;  Location: [facility] INVASIVE CV LAB;  Service: Cardiovascular;  Laterality: N/A;   CORONARY/GRAFT ACUTE MI REVASCULARIZATION N/A 01/10/2018   Procedure: Coronary/Graft Acute MI Revascularization;  Surgeon: Runell Gess, MD;  Location: [facility] INVASIVE  CV LAB;  Service: Cardiovascular;  Laterality: N/A;   EMBOLIZATION Left 03/19/2018   EMBOLIZATION (CATH LAB) Left 03/19/2018   Procedure: EMBOLIZATION - Left Internal;  Surgeon: Fransisco Hertz, MD;  Location: [facility] INVASIVE CV LAB;  Service: Cardiovascular;  Laterality: Left;   EMBOLIZATION (CATH LAB) Right 04/16/2018   Procedure: EMBOLIZATION;  Surgeon: Fransisco Hertz, MD;  Location: [facility] INVASIVE CV LAB;  Service: Cardiovascular;  Laterality: Right;   ENDOVEIN HARVEST OF GREATER SAPHENOUS VEIN Right 02/11/2020   Procedure: Mack Guise Of Greater Saphenous Vein using right lower extremity.;  Surgeon: Linden Dolin, MD;  Location: [facility] OR;  Service: Open Heart Surgery;  Laterality: Right;   FEMORAL ARTERY EXPLORATION Right  05/07/2018   Procedure: FEMORAL ARTERY EXPLORATION, EXTENDED PROFUNDAPLASTY;  Surgeon: Fransisco Hertz, MD;  Location: [facility] OR;  Service: Vascular;  Laterality: Right;   INTRAOPERATIVE ARTERIOGRAM Right 05/07/2018   Procedure: INTRA OPERATIVE ARTERIOGRAM;  Surgeon: Fransisco Hertz, MD;  Location: [facility] OR;  Service: Vascular;  Laterality: Right;   LEFT HEART CATH AND CORONARY ANGIOGRAPHY N/A 01/10/2018   Procedure: LEFT HEART CATH AND CORONARY ANGIOGRAPHY;  Surgeon: Runell Gess, MD;  Location: [facility] INVASIVE CV LAB;  Service: Cardiovascular;  Laterality: N/A;   LEFT HEART CATH AND CORONARY ANGIOGRAPHY N/A 02/08/2020   Procedure: LEFT HEART CATH AND CORONARY ANGIOGRAPHY;  Surgeon: Lennette Bihari, MD;  Location: [facility] INVASIVE CV LAB;  Service: Cardiovascular;  Laterality: N/A;   PACEMAKER INSERTION  11/2005   PACEMAKER LEAD REMOVAL N/A 10/17/2014   Procedure: PACEMAKER LEAD REMOVAL;  Surgeon: Marinus Maw, MD;  Location: [facility] OR;  Service: Cardiovascular;  Laterality: N/A;   RENAL ANGIOGRAPHY Left 04/16/2018   Procedure: RENAL ANGIOGRAPHY;  Surgeon: Fransisco Hertz, MD;  Location: [facility] INVASIVE CV LAB;  Service: Cardiovascular;  Laterality: Left;   RIGHT/LEFT HEART CATH AND CORONARY/GRAFT  ANGIOGRAPHY N/A 07/31/2022   Procedure: RIGHT/LEFT HEART CATH AND CORONARY/GRAFT ANGIOGRAPHY;  Surgeon: Kathleene Hazel, MD;  Location: [facility] INVASIVE CV LAB;  Service: Cardiovascular;  Laterality: N/A;   s/p ICD placement      Medtronic Maximo [phone] single chamber   TEE WITHOUT CARDIOVERSION N/A 02/11/2020   Procedure: TRANSESOPHAGEAL ECHOCARDIOGRAM (TEE);  Surgeon: Linden Dolin, MD;  Location: [facility] OR;  Service: Open Heart Surgery;  Laterality: N/A;    Family History  Problem Relation Age of Onset   Stroke Father    Coronary artery disease Maternal Aunt    Heart failure Maternal Aunt    Lung cancer Maternal Aunt    Lung cancer Maternal Uncle    Cancer Brother        Mouth   Cancer Sister        throat    Social History   Socioeconomic History   Marital status: Married    Spouse name: Not on file   Number of children: 2   Years of education: Not on file   Highest education level: 10th grade  Occupational History   Occupation: Grave digger--now disabled  Tobacco Use   Smoking status: Former    Current packs/day: 0.00    Average packs/day: 2.5 packs/day for 40.0 years (100.0 ttl pk-yrs)    Types: Cigarettes    Start date: 01/09/1978    Quit date: 01/09/2018    Years since quitting: 6.1    Passive exposure: Never   Smokeless tobacco: Never   Tobacco comments:    GAVE [id number]-NOW  Vaping Use   Vaping status: Never Used  Substance and Sexual Activity   Alcohol use: No   Drug use: No   Sexual activity: Not Currently  Other Topics Concern   Not on file  Social History Narrative   No living will   Requests wife as health care POA--alternate is daughter or son   Would accept resuscitation but doesn't want prolonged ventilation   No tube feeds if cognitively unaware   Social Drivers of Health   Financial Resource Strain: Low Risk  (02/23/2024)   Overall Financial Resource Strain (CARDIA)    Difficulty of Paying Living Expenses: Not very hard  Food Insecurity:  No Food Insecurity (02/23/2024)   Hunger Vital Sign    Worried About Running Out of Food  in the Last Year: Never true    Ran Out of Food in the Last Year: Never true  Transportation Needs: No Transportation Needs (02/23/2024)   PRAPARE - Administrator, Civil Service (Medical): No    Lack of Transportation (Non-Medical): No  Physical Activity: Unknown (02/23/2024)   Exercise Vital Sign    Days of Exercise per Week: 0 days    Minutes of Exercise per Session: Not on file  Stress: No Stress Concern Present (02/23/2024)   [employer] of Occupational Health - Occupational Stress Questionnaire    Feeling of Stress : Not at all  Social Connections: Unknown (02/23/2024)   Social Connection and Isolation Panel [NHANES]    Frequency of Communication with Friends and Family: More than three times a week    Frequency of Social Gatherings with Friends and Family: More than three times a week    Attends Religious Services: Patient declined    Database administrator or Organizations: No    Attends Engineer, structural: Not on file    Marital Status: Married  Catering manager Violence: Not on file   Review of Systems Still doesn't sleep well Bowels moving okay     Objective:   Physical Exam Constitutional:      Appearance: Normal appearance.  Cardiovascular:     Rate and Rhythm: Normal rate and regular rhythm.     Heart sounds: No murmur heard.    No gallop.     Comments: Feet warm but no palpable pulses Pulmonary:     Effort: Pulmonary effort is normal.     Breath sounds: No wheezing or rales.     Comments: Decreased breath sounds but clear Musculoskeletal:     Cervical back: Neck supple.     Right lower leg: No edema.     Left lower leg: No edema.  Lymphadenopathy:     Cervical: No cervical adenopathy.  Skin:    Comments: No foot lesions  Neurological:     Mental Status: He is alert.     Comments: Normal sensation in feet            Assessment & Plan:

## 2024-02-25 NOTE — Procedures (Signed)
 Lumbar Facet Joint Nerve Denervation  Patient: [name]      Date of Birth: November 16, 1956 MRN: [id number] PCP: Karie Schwalbe, MD      Visit Date: 02/24/2024   Universal Protocol:    Date/Time: 02/24/2509:23 AM  Consent Given By: the patient  Position: PRONE  Additional Comments: Vital signs were monitored before and after the procedure. Patient was prepped and draped in the usual sterile fashion. The correct patient, procedure, and site was verified.   Injection Procedure Details:   Procedure diagnoses:  1. Spondylosis without myelopathy or radiculopathy, lumbar region      Meds Administered:  Meds ordered this encounter  Medications   methylPREDNISolone acetate (DEPO-MEDROL) injection 40 mg     Laterality: Left  Location/Site:  L4-L5, L3 and L4 medial branches and L5-S1, L4 medial branch and L5 dorsal ramus  Needle: 18 ga.,  10mm active tip, RF Cannula  Needle Placement: Along juncture of superior articular process and transverse pocess  Findings:  -Comments:  Procedure Details: For each desired target nerve, the corresponding transverse process (sacral ala for the L5 dorsal rami) was identified and the fluoroscope was positioned to square off the endplates of the corresponding vertebral body to achieve a true AP midline view.  The beam was then obliqued 15 to 20 degrees and caudally tilted 15 to 20 degrees to line up a trajectory along the target nerves. The skin over the target of the junction of superior articulating process and transverse process (sacral ala for the L5 dorsal rami) was infiltrated with 1ml of 1% Lidocaine without Epinephrine.  The 18 gauge 10mm active tip outer cannula was advanced in trajectory view to the target.  This procedure was repeated for each target nerve.  Then, for all levels, the outer cannula placement was fine-tuned and the position was then confirmed with bi-planar imaging.    Test stimulation was done both at sensory  and motor levels to ensure there was no radicular stimulation. The target tissues were then infiltrated with 1 ml of 1% Lidocaine without Epinephrine. Subsequently, a percutaneous neurotomy was carried out for 90 seconds at 80 degrees Celsius.  After the completion of the lesion, 1 ml of injectate was delivered. It was then repeated for each facet joint nerve mentioned above. Appropriate radiographs were obtained to verify the probe placement during the neurotomy.   Additional Comments:  No complications occurred Dressing: 2 x 2 sterile gauze and Band-Aid    Post-procedure details: Patient was observed during the procedure. Post-procedure instructions were reviewed.  Patient left the clinic in stable condition.

## 2024-02-25 NOTE — Progress Notes (Addendum)
 [name] - 68 y.o. male MRN [id number]  Date of birth: 04/01/1956  Office Visit Note: Visit Date: 02/24/2024 PCP: Karie Schwalbe, MD Referred by: Karie Schwalbe, MD  Subjective: Chief Complaint  Patient presents with   Lower Back - Pain   HPI:  [name] is a 68 y.o. male who comes in todayfor planned radiofrequency ablation of the Right L4-5 and L5-S1 Lumbar facet joints. This would be ablation of the corresponding medial branches and/or dorsal rami.  Patient has had double diagnostic blocks with more than 50% relief.  These are documented on pain diary.  They have had chronic back pain for quite some time, more than 3 months, which has been an ongoing situation with recalcitrant axial back pain.  They have no radicular pain.  Their axial pain is worse with standing and ambulating and on exam today with facet loading.  They have had physical therapy as well as home exercise program.  The imaging noted in the chart below indicated facet pathology. Accordingly they meet all the criteria and qualification for for radiofrequency ablation and we are going to complete this today hopefully for more longer term relief as part of comprehensive management program.  **please note on procedure note I dictated the left side but I did in fact do the right side.   ROS Otherwise per HPI.  Assessment & Plan: Visit Diagnoses:    ICD-10-CM   1. Spondylosis without myelopathy or radiculopathy, lumbar region  M47.816 XR C-ARM NO REPORT    Radiofrequency,Lumbar    methylPREDNISolone acetate (DEPO-MEDROL) injection 40 mg      Plan: No additional findings.   Meds & Orders:  Meds ordered this encounter  Medications   methylPREDNISolone acetate (DEPO-MEDROL) injection 40 mg    Orders Placed This Encounter  Procedures   Radiofrequency,Lumbar   XR C-ARM NO REPORT    Follow-up: Return if symptoms worsen or fail to improve.   Procedures: No procedures performed  Lumbar Facet Joint  Nerve Denervation  Patient: [name]      Date of Birth: June 29, 1956 MRN: [id number] PCP: Karie Schwalbe, MD      Visit Date: 02/24/2024   Universal Protocol:    Date/Time: 02/24/2509:23 AM  Consent Given By: the patient  Position: PRONE  Additional Comments: Vital signs were monitored before and after the procedure. Patient was prepped and draped in the usual sterile fashion. The correct patient, procedure, and site was verified.   Injection Procedure Details:   Procedure diagnoses:  1. Spondylosis without myelopathy or radiculopathy, lumbar region      Meds Administered:  Meds ordered this encounter  Medications   methylPREDNISolone acetate (DEPO-MEDROL) injection 40 mg     Laterality: Left  Location/Site:  L4-L5, L3 and L4 medial branches and L5-S1, L4 medial branch and L5 dorsal ramus  Needle: 18 ga.,  10mm active tip, RF Cannula  Needle Placement: Along juncture of superior articular process and transverse pocess  Findings:  -Comments:  Procedure Details: For each desired target nerve, the corresponding transverse process (sacral ala for the L5 dorsal rami) was identified and the fluoroscope was positioned to square off the endplates of the corresponding vertebral body to achieve a true AP midline view.  The beam was then obliqued 15 to 20 degrees and caudally tilted 15 to 20 degrees to line up a trajectory along the target nerves. The skin over the target of the junction of superior articulating process and transverse process (  sacral ala for the L5 dorsal rami) was infiltrated with 1ml of 1% Lidocaine without Epinephrine.  The 18 gauge 10mm active tip outer cannula was advanced in trajectory view to the target.  This procedure was repeated for each target nerve.  Then, for all levels, the outer cannula placement was fine-tuned and the position was then confirmed with bi-planar imaging.    Test stimulation was done both at sensory and motor levels to  ensure there was no radicular stimulation. The target tissues were then infiltrated with 1 ml of 1% Lidocaine without Epinephrine. Subsequently, a percutaneous neurotomy was carried out for 90 seconds at 80 degrees Celsius.  After the completion of the lesion, 1 ml of injectate was delivered. It was then repeated for each facet joint nerve mentioned above. Appropriate radiographs were obtained to verify the probe placement during the neurotomy.   Additional Comments:  No complications occurred Dressing: 2 x 2 sterile gauze and Band-Aid    Post-procedure details: Patient was observed during the procedure. Post-procedure instructions were reviewed.  Patient left the clinic in stable condition.      Clinical History: Narrative & Impression CLINICAL DATA:  Spinal stenosis, lumbar. Low back pain with right leg radicular symptoms over the last 3 months.   EXAM: MRI LUMBAR SPINE WITHOUT CONTRAST   TECHNIQUE: Multiplanar, multisequence MR imaging of the lumbar spine was performed. No intravenous contrast was administered.   COMPARISON:  Radiography 11/03/2023   FINDINGS: Segmentation:  5 lumbar type vertebral bodies assumed.   Alignment:  Minimal scoliotic curvature convex to the left.   Vertebrae: Discogenic edematous changes of the endplates at L2-3 which could relate to regional pain.   Conus medullaris and cauda equina: Conus extends to the L1 level. Conus and cauda equina appear normal.   Paraspinal and other soft tissues: Previous aortoiliac aneurysms with stent graft treatment. Not primarily or completely evaluated.   Disc levels:   T12-L1 and L1-2: Mild noncompressive disc bulges.   L2-3: Disc degeneration with circumferential bulging. Focal anterior right paracentral herniation. Mild narrowing of the lateral recesses but no visible neural compression. As noted above, discogenic endplate marrow changes could relate to regional pain.   L3-4: Minimal disc bulge and  facet hypertrophy.  No stenosis.   L4-5: Endplate osteophytes and bulging of the disc. Bilateral facet degeneration and hypertrophy with joint effusions. Stenosis of both lateral recesses and neural foramina that could be symptomatic. This appearance could worsen with standing or flexion.   L5-S1: Mild bulging of the disc. Bilateral facet osteoarthritis. No central canal stenosis. Bilateral foraminal narrowing, worse on the left. Some potential the left L5 nerve could be irritated as it exits the narrowed foramen.   IMPRESSION: 1. L2-3: Disc degeneration with circumferential bulging. Focal anterior right paracentral herniation. Mild narrowing of the lateral recesses but no visible neural compression. Discogenic endplate marrow changes could relate to regional pain. 2. L4-5: Endplate osteophytes and bulging of the disc. Bilateral facet degeneration and hypertrophy with joint effusions. Stenosis of both lateral recesses and neural foramina that could be symptomatic. This appearance could worsen with standing or flexion. The facet arthropathy could additionally be a cause of back pain or referred facet syndrome pain. 3. L5-S1: Disc bulge. Bilateral facet osteoarthritis. Bilateral foraminal narrowing, worse on the left. Some potential the left L5 nerve could be irritated as it exits the narrowed foramen.     Electronically Signed   By: Paulina Fusi M.D.   On: 12/07/2023 13:52  Objective:  VS:  HT:    WT:   BMI:     BP:131/81  HR:75bpm  TEMP: ( )  RESP:  Physical Exam Vitals and nursing note reviewed.  Constitutional:      General: He is not in acute distress.    Appearance: Normal appearance. He is obese. He is not ill-appearing.  HENT:     Head: Normocephalic and atraumatic.     Right Ear: External ear normal.     Left Ear: External ear normal.     Nose: No congestion.  Eyes:     Extraocular Movements: Extraocular movements intact.  Cardiovascular:     Rate and  Rhythm: Normal rate.     Pulses: Normal pulses.  Pulmonary:     Effort: Pulmonary effort is normal. No respiratory distress.  Abdominal:     General: There is no distension.     Palpations: Abdomen is soft.  Musculoskeletal:        General: No tenderness or signs of injury.     Cervical back: Neck supple.     Right lower leg: No edema.     Left lower leg: No edema.     Comments: Patient has good distal strength without clonus.  Skin:    Findings: No erythema or rash.  Neurological:     General: No focal deficit present.     Mental Status: He is alert and oriented to person, place, and time.     Sensory: No sensory deficit.     Motor: No weakness or abnormal muscle tone.     Coordination: Coordination normal.  Psychiatric:        Mood and Affect: Mood normal.        Behavior: Behavior normal.      Imaging: No results found.

## 2024-03-09 ENCOUNTER — Ambulatory Visit: Payer: PPO | Admitting: Physical Medicine and Rehabilitation

## 2024-03-09 ENCOUNTER — Other Ambulatory Visit: Payer: Self-pay

## 2024-03-09 VITALS — BP 95/66 | HR 84

## 2024-03-09 DIAGNOSIS — M47816 Spondylosis without myelopathy or radiculopathy, lumbar region: Secondary | ICD-10-CM

## 2024-03-09 MED ORDER — METHYLPREDNISOLONE ACETATE 40 MG/ML IJ SUSP
40.0000 mg | Freq: Once | INTRAMUSCULAR | Status: AC
  Administered 2024-03-09: 40 mg

## 2024-03-09 NOTE — Progress Notes (Signed)
 [name] - 68 y.o. male MRN [id number]  Date of birth: 1956/11/24  Office Visit Note: Visit Date: 03/09/2024 PCP: Karie Schwalbe, MD Referred by: Karie Schwalbe, MD  Subjective: Chief Complaint  Patient presents with   Lower Back - Pain   HPI:  [name] is a 68 y.o. male who comes in todayfor planned radiofrequency ablation of the Left L4-5 and L5-S1 Lumbar facet joints. This would be ablation of the corresponding medial branches and/or dorsal rami.  Patient has had double diagnostic blocks with more than 50% relief.  These are documented on pain diary.  They have had chronic back pain for quite some time, more than 3 months, which has been an ongoing situation with recalcitrant axial back pain.  They have no radicular pain.  Their axial pain is worse with standing and ambulating and on exam today with facet loading.  They have had physical therapy as well as home exercise program.  The imaging noted in the chart below indicated facet pathology. Accordingly they meet all the criteria and qualification for for radiofrequency ablation and we are going to complete this today hopefully for more longer term relief as part of comprehensive management program.   ROS Otherwise per HPI.  Assessment & Plan: Visit Diagnoses:    ICD-10-CM   1. Facet arthropathy, lumbar  M47.816 XR C-ARM NO REPORT    Radiofrequency,Lumbar    methylPREDNISolone acetate (DEPO-MEDROL) injection 40 mg    2. Spondylosis without myelopathy or radiculopathy, lumbar region  M47.816 XR C-ARM NO REPORT    Radiofrequency,Lumbar    methylPREDNISolone acetate (DEPO-MEDROL) injection 40 mg      Plan: No additional findings.   Meds & Orders:  Meds ordered this encounter  Medications   methylPREDNISolone acetate (DEPO-MEDROL) injection 40 mg    Orders Placed This Encounter  Procedures   Radiofrequency,Lumbar   XR C-ARM NO REPORT    Follow-up: Return if symptoms worsen or fail to improve.    Procedures: No procedures performed  Lumbar Facet Joint Nerve Denervation  Patient: [name]      Date of Birth: 10/20/1956 MRN: [id number] PCP: Karie Schwalbe, MD      Visit Date: 03/09/2024   Universal Protocol:    Date/Time: 03/18/253:45 PM  Consent Given By: the patient  Position: PRONE  Additional Comments: Vital signs were monitored before and after the procedure. Patient was prepped and draped in the usual sterile fashion. The correct patient, procedure, and site was verified.   Injection Procedure Details:   Procedure diagnoses:  1. Facet arthropathy, lumbar   2. Spondylosis without myelopathy or radiculopathy, lumbar region      Meds Administered:  Meds ordered this encounter  Medications   methylPREDNISolone acetate (DEPO-MEDROL) injection 40 mg     Laterality: Left  Location/Site:  L4-L5, L3 and L4 medial branches and L5-S1, L4 medial branch and L5 dorsal ramus  Needle: 18 ga.,  10mm active tip, RF Cannula  Needle Placement: Along juncture of superior articular process and transverse pocess  Findings:  -Comments:  Procedure Details: For each desired target nerve, the corresponding transverse process (sacral ala for the L5 dorsal rami) was identified and the fluoroscope was positioned to square off the endplates of the corresponding vertebral body to achieve a true AP midline view.  The beam was then obliqued 15 to 20 degrees and caudally tilted 15 to 20 degrees to line up a trajectory along the target nerves. The skin over  the target of the junction of superior articulating process and transverse process (sacral ala for the L5 dorsal rami) was infiltrated with 1ml of 1% Lidocaine without Epinephrine.  The 18 gauge 10mm active tip outer cannula was advanced in trajectory view to the target.  This procedure was repeated for each target nerve.  Then, for all levels, the outer cannula placement was fine-tuned and the position was then  confirmed with bi-planar imaging.    Test stimulation was done both at sensory and motor levels to ensure there was no radicular stimulation. The target tissues were then infiltrated with 1 ml of 1% Lidocaine without Epinephrine. Subsequently, a percutaneous neurotomy was carried out for 90 seconds at 80 degrees Celsius.  After the completion of the lesion, 1 ml of injectate was delivered. It was then repeated for each facet joint nerve mentioned above. Appropriate radiographs were obtained to verify the probe placement during the neurotomy.   Additional Comments:  The patient tolerated the procedure well Dressing: 2 x 2 sterile gauze and Band-Aid    Post-procedure details: Patient was observed during the procedure. Post-procedure instructions were reviewed.  Patient left the clinic in stable condition.      Clinical History: Narrative & Impression CLINICAL DATA:  Spinal stenosis, lumbar. Low back pain with right leg radicular symptoms over the last 3 months.   EXAM: MRI LUMBAR SPINE WITHOUT CONTRAST   TECHNIQUE: Multiplanar, multisequence MR imaging of the lumbar spine was performed. No intravenous contrast was administered.   COMPARISON:  Radiography 11/03/2023   FINDINGS: Segmentation:  5 lumbar type vertebral bodies assumed.   Alignment:  Minimal scoliotic curvature convex to the left.   Vertebrae: Discogenic edematous changes of the endplates at L2-3 which could relate to regional pain.   Conus medullaris and cauda equina: Conus extends to the L1 level. Conus and cauda equina appear normal.   Paraspinal and other soft tissues: Previous aortoiliac aneurysms with stent graft treatment. Not primarily or completely evaluated.   Disc levels:   T12-L1 and L1-2: Mild noncompressive disc bulges.   L2-3: Disc degeneration with circumferential bulging. Focal anterior right paracentral herniation. Mild narrowing of the lateral recesses but no visible neural compression.  As noted above, discogenic endplate marrow changes could relate to regional pain.   L3-4: Minimal disc bulge and facet hypertrophy.  No stenosis.   L4-5: Endplate osteophytes and bulging of the disc. Bilateral facet degeneration and hypertrophy with joint effusions. Stenosis of both lateral recesses and neural foramina that could be symptomatic. This appearance could worsen with standing or flexion.   L5-S1: Mild bulging of the disc. Bilateral facet osteoarthritis. No central canal stenosis. Bilateral foraminal narrowing, worse on the left. Some potential the left L5 nerve could be irritated as it exits the narrowed foramen.   IMPRESSION: 1. L2-3: Disc degeneration with circumferential bulging. Focal anterior right paracentral herniation. Mild narrowing of the lateral recesses but no visible neural compression. Discogenic endplate marrow changes could relate to regional pain. 2. L4-5: Endplate osteophytes and bulging of the disc. Bilateral facet degeneration and hypertrophy with joint effusions. Stenosis of both lateral recesses and neural foramina that could be symptomatic. This appearance could worsen with standing or flexion. The facet arthropathy could additionally be a cause of back pain or referred facet syndrome pain. 3. L5-S1: Disc bulge. Bilateral facet osteoarthritis. Bilateral foraminal narrowing, worse on the left. Some potential the left L5 nerve could be irritated as it exits the narrowed foramen.     Electronically Signed  By: Paulina Fusi M.D.   On: 12/07/2023 13:52     Objective:  VS:  HT:    WT:   BMI:     BP:95/66  HR:84bpm  TEMP: ( )  RESP:  Physical Exam Vitals and nursing note reviewed.  Constitutional:      General: He is not in acute distress.    Appearance: Normal appearance. He is not ill-appearing.  HENT:     Head: Normocephalic and atraumatic.     Right Ear: External ear normal.     Left Ear: External ear normal.     Nose: No  congestion.  Eyes:     Extraocular Movements: Extraocular movements intact.  Cardiovascular:     Rate and Rhythm: Normal rate.     Pulses: Normal pulses.  Pulmonary:     Effort: Pulmonary effort is normal. No respiratory distress.  Abdominal:     General: There is no distension.     Palpations: Abdomen is soft.  Musculoskeletal:        General: No tenderness or signs of injury.     Cervical back: Neck supple.     Right lower leg: No edema.     Left lower leg: No edema.     Comments: Patient has good distal strength without clonus.  Skin:    Findings: No erythema or rash.  Neurological:     General: No focal deficit present.     Mental Status: He is alert and oriented to person, place, and time.     Sensory: No sensory deficit.     Motor: No weakness or abnormal muscle tone.     Coordination: Coordination normal.  Psychiatric:        Mood and Affect: Mood normal.        Behavior: Behavior normal.      Imaging: No results found.

## 2024-03-09 NOTE — Procedures (Signed)
 Lumbar Facet Joint Nerve Denervation  Patient: [name]      Date of Birth: 02-25-1956 MRN: [id number] PCP: Karie Schwalbe, MD      Visit Date: 03/09/2024   Universal Protocol:    Date/Time: 03/18/253:45 PM  Consent Given By: the patient  Position: PRONE  Additional Comments: Vital signs were monitored before and after the procedure. Patient was prepped and draped in the usual sterile fashion. The correct patient, procedure, and site was verified.   Injection Procedure Details:   Procedure diagnoses:  1. Facet arthropathy, lumbar   2. Spondylosis without myelopathy or radiculopathy, lumbar region      Meds Administered:  Meds ordered this encounter  Medications   methylPREDNISolone acetate (DEPO-MEDROL) injection 40 mg     Laterality: Left  Location/Site:  L4-L5, L3 and L4 medial branches and L5-S1, L4 medial branch and L5 dorsal ramus  Needle: 18 ga.,  10mm active tip, RF Cannula  Needle Placement: Along juncture of superior articular process and transverse pocess  Findings:  -Comments:  Procedure Details: For each desired target nerve, the corresponding transverse process (sacral ala for the L5 dorsal rami) was identified and the fluoroscope was positioned to square off the endplates of the corresponding vertebral body to achieve a true AP midline view.  The beam was then obliqued 15 to 20 degrees and caudally tilted 15 to 20 degrees to line up a trajectory along the target nerves. The skin over the target of the junction of superior articulating process and transverse process (sacral ala for the L5 dorsal rami) was infiltrated with 1ml of 1% Lidocaine without Epinephrine.  The 18 gauge 10mm active tip outer cannula was advanced in trajectory view to the target.  This procedure was repeated for each target nerve.  Then, for all levels, the outer cannula placement was fine-tuned and the position was then confirmed with bi-planar imaging.    Test  stimulation was done both at sensory and motor levels to ensure there was no radicular stimulation. The target tissues were then infiltrated with 1 ml of 1% Lidocaine without Epinephrine. Subsequently, a percutaneous neurotomy was carried out for 90 seconds at 80 degrees Celsius.  After the completion of the lesion, 1 ml of injectate was delivered. It was then repeated for each facet joint nerve mentioned above. Appropriate radiographs were obtained to verify the probe placement during the neurotomy.   Additional Comments:  The patient tolerated the procedure well Dressing: 2 x 2 sterile gauze and Band-Aid    Post-procedure details: Patient was observed during the procedure. Post-procedure instructions were reviewed.  Patient left the clinic in stable condition.

## 2024-03-09 NOTE — Progress Notes (Signed)
 Pain Scale   Average Pain 8        +Driver, -+BT, -Dye Allergies.

## 2024-03-09 NOTE — Patient Instructions (Signed)

## 2024-04-05 ENCOUNTER — Other Ambulatory Visit: Payer: Self-pay | Admitting: Cardiology

## 2024-04-07 ENCOUNTER — Ambulatory Visit

## 2024-04-07 VITALS — Ht 70.0 in | Wt 259.0 lb

## 2024-04-07 DIAGNOSIS — Z0000 Encounter for general adult medical examination without abnormal findings: Secondary | ICD-10-CM

## 2024-04-07 DIAGNOSIS — Z741 Need for assistance with personal care: Secondary | ICD-10-CM

## 2024-04-07 DIAGNOSIS — Z558 Other problems related to education and literacy: Secondary | ICD-10-CM

## 2024-04-07 NOTE — Progress Notes (Addendum)
 Subjective:   [name] is a 68 y.o. who presents for a Medicare Wellness preventive visit.  Visit Complete: Virtual I connected with  [name] on 04/07/24 by a audio enabled telemedicine application and verified that I am speaking with the correct person using two identifiers.  Patient Location: Home  Provider Location: Office/Clinic  I discussed the limitations of evaluation and management by telemedicine. The patient expressed understanding and agreed to proceed.  Vital Signs: Because this visit was a virtual/telehealth visit, some criteria may be missing or patient reported. Any vitals not documented were not able to be obtained and vitals that have been documented are patient reported.  VideoDeclined- This patient declined Librarian, academic. Therefore the visit was completed with audio only.  Persons Participating in Visit: Patient.  AWV Questionnaire: No: Patient Medicare AWV questionnaire was not completed prior to this visit.  Cardiac Risk Factors include: advanced age (>39men, >21 women);diabetes mellitus;dyslipidemia;hypertension;male gender;obesity (BMI >30kg/m2);sedentary lifestyle     Objective:    Today's Vitals   04/07/24 0813 04/07/24 0816  Weight: 259 lb (117.5 kg)   Height: 5\' 10"  (1.778 m)   PainSc:  10-Worst pain ever   Body mass index is 37.16 kg/m.     04/07/2024    8:35 AM 07/31/2022    8:32 AM 05/21/2020    5:34 PM 04/26/2020    8:35 PM 02/08/2020    5:42 PM 02/08/2020   10:56 AM 12/23/2018    8:23 PM  Advanced Directives  Does Patient Have a Medical Advance Directive? Yes No No No No No No  Type of Estate agent of [place];Living will        Copy of Healthcare Power of Attorney in Chart? No - copy requested        Would patient like information on creating a medical advance directive?  No - Patient declined No - Patient declined No - Patient declined No - Patient declined No - Patient declined  No - Patient declined    Current Medications (verified) Outpatient Encounter Medications as of 04/07/2024  Medication Sig   albuterol (VENTOLIN HFA) 108 (90 Base) MCG/ACT inhaler TAKE 2 PUFFS BY MOUTH EVERY 6 HOURS AS NEEDED FOR WHEEZE OR SHORTNESS OF BREATH   aspirin 81 MG chewable tablet Chew 1 tablet (81 mg total) by mouth daily.   atorvastatin (LIPITOR) 80 MG tablet TAKE ONE TABLET BY MOUTH ONCE A DAY AT SIX IN THE EVENING   Blood Glucose Monitoring Suppl DEVI 1 each by Does not apply route in the morning, at noon, and at bedtime. May substitute to any manufacturer covered by patient's insurance.   Budeson-Glycopyrrol-Formoterol (BREZTRI AEROSPHERE) 160-9-4.8 MCG/ACT AERO Inhale 2 puffs into the lungs in the morning and at bedtime.   clopidogrel (PLAVIX) 75 MG tablet TAKE ONE TABLET BY MOUTH EVERY DAY   diclofenac Sodium (VOLTAREN) 1 % GEL Apply 1 Application topically 3 (three) times daily as needed (pain).   empagliflozin (JARDIANCE) 25 MG TABS tablet Take 1 tablet (25 mg total) by mouth daily.   fluticasone (FLONASE) 50 MCG/ACT nasal spray SPRAY 1 SPRAY INTO BOTH NOSTRILS DAILY   furosemide (LASIX) 20 MG tablet TAKE ONE TABLET BY MOUTH ONCE A DAY   gabapentin (NEURONTIN) 100 MG capsule TAKE ONE CAPSULE BY MOUTH THREE TIMES DAILY   isosorbide mononitrate (IMDUR) 30 MG 24 hr tablet TAKE ONE TABLET BY MOUTH ONCE EVERY EVENING   metFORMIN (GLUCOPHAGE-XR) 500 MG 24 hr tablet TAKE TWO  TABLETS BY MOUTH EVERY DAY WITH BREAKFAST   metoprolol tartrate (LOPRESSOR) 25 MG tablet TAKE ONE TABLET BY MOUTH TWO TIMES A DAY   Multiple Vitamin (MULTIVITAMIN WITH MINERALS) TABS tablet Take 1 tablet by mouth daily.   nitroGLYCERIN (NITROSTAT) 0.4 MG SL tablet Place 0.4 mg under the tongue every 5 (five) minutes as needed for chest pain.   pantoprazole (PROTONIX) 40 MG tablet TAKE ONE TABLET BY MOUTH TWICE A DAY   sacubitril-valsartan (ENTRESTO) 97-103 MG Take 1 tablet by mouth 2 (two) times daily.    Semaglutide, 1 MG/DOSE, 4 MG/3ML SOPN Inject 1 mg as directed once a week.   traMADol (ULTRAM) 50 MG tablet Take 1 tablet (50 mg total) by mouth every 6 (six) hours as needed.   vitamin B-12 (CYANOCOBALAMIN) 1000 MCG tablet Take 1,000 mcg by mouth daily.   glipiZIDE (GLUCOTROL) 5 MG tablet Take 1 tablet (5 mg total) by mouth 2 (two) times daily before a meal. (Patient not taking: Reported on 04/07/2024)   Glucose Blood (BLOOD GLUCOSE TEST STRIPS) STRP 1 each by In Vitro route in the morning, at noon, and at bedtime. May substitute to any manufacturer covered by patient's insurance.   No facility-administered encounter medications on file as of 04/07/2024.    Allergies (verified) Spironolactone   History: Past Medical History:  Diagnosis Date   Arthritis    Automatic implantable cardioverter-defibrillator in situ    Blood transfusion without reported diagnosis    CHF (congestive heart failure) (HCC)    COPD (chronic obstructive pulmonary disease) (HCC)    emphysema by CXR   Coronary artery disease    Diabetes mellitus    type 2  no meds   Diverticulosis of colon    Emphysema of lung (HCC)    GERD (gastroesophageal reflux disease)    with esophagitis   History of colonic polyps    Hypertension    Non-ischemic cardiomyopathy (HCC)    Presence of permanent cardiac pacemaker    PVD (peripheral vascular disease) (HCC)    bilateral common iliac artery aneurysms. Right SFA occlusion over a long segment. Left SFA disease with occlusion of the left TP trunk. Artirogram Oct. 2006   Sleep apnea    Tobacco abuse    Urinary incontinence    detrussor instability   Past Surgical History:  Procedure Laterality Date   ABDOMINAL AORTIC ENDOVASCULAR STENT GRAFT N/A 05/07/2018   Procedure: ABDOMINAL AORTIC ENDOVASCULAR STENT GRAFT;  Surgeon: Arvil Lauber, MD;  Location: [facility] OR;  Service: Vascular;  Laterality: N/A;   CARDIAC CATHETERIZATION  06/2004   negative   CARDIAC CATHETERIZATION   02/08/2020   CLIPPING OF ATRIAL APPENDAGE N/A 02/11/2020   Procedure: Clipping Of Atrial Appendage using AtriCure 35 MM AtriClip.;  Surgeon: Rudine Cos, MD;  Location: [facility] OR;  Service: Open Heart Surgery;  Laterality: N/A;   COLONOSCOPY  04/2005   COPD exacerbation     CORONARY ANGIOPLASTY  01/09/2018   CORONARY ARTERY BYPASS GRAFT N/A 02/11/2020   Procedure: CORONARY ARTERY BYPASS GRAFTING (CABG) using LIMA to LAD; RIMA to PL; Endoscopic right greater saphenous vein harvest: SVC to Diag; SVC to OM1.;  Surgeon: Rudine Cos, MD;  Location: [facility] OR;  Service: Open Heart Surgery;  Laterality: N/A;  BILATERAL IMA   CORONARY STENT INTERVENTION N/A 01/10/2018   Procedure: CORONARY STENT INTERVENTION;  Surgeon: Avanell Leigh, MD;  Location: [facility] INVASIVE CV LAB;  Service: Cardiovascular;  Laterality: N/A;   CORONARY/GRAFT ACUTE MI REVASCULARIZATION N/A  01/10/2018   Procedure: Coronary/Graft Acute MI Revascularization;  Surgeon: Avanell Leigh, MD;  Location: [facility] INVASIVE CV LAB;  Service: Cardiovascular;  Laterality: N/A;   EMBOLIZATION Left 03/19/2018   EMBOLIZATION (CATH LAB) Left 03/19/2018   Procedure: EMBOLIZATION - Left Internal;  Surgeon: Arvil Lauber, MD;  Location: [facility] INVASIVE CV LAB;  Service: Cardiovascular;  Laterality: Left;   EMBOLIZATION (CATH LAB) Right 04/16/2018   Procedure: EMBOLIZATION;  Surgeon: Arvil Lauber, MD;  Location: [facility] INVASIVE CV LAB;  Service: Cardiovascular;  Laterality: Right;   ENDOVEIN HARVEST OF GREATER SAPHENOUS VEIN Right 02/11/2020   Procedure: Astrid Blamer Of Greater Saphenous Vein using right lower extremity.;  Surgeon: Rudine Cos, MD;  Location: [facility] OR;  Service: Open Heart Surgery;  Laterality: Right;   FEMORAL ARTERY EXPLORATION Right 05/07/2018   Procedure: FEMORAL ARTERY EXPLORATION, EXTENDED PROFUNDAPLASTY;  Surgeon: Arvil Lauber, MD;  Location: [facility] OR;  Service: Vascular;  Laterality: Right;   INTRAOPERATIVE ARTERIOGRAM Right 05/07/2018    Procedure: INTRA OPERATIVE ARTERIOGRAM;  Surgeon: Arvil Lauber, MD;  Location: [facility] OR;  Service: Vascular;  Laterality: Right;   LEFT HEART CATH AND CORONARY ANGIOGRAPHY N/A 01/10/2018   Procedure: LEFT HEART CATH AND CORONARY ANGIOGRAPHY;  Surgeon: Avanell Leigh, MD;  Location: [facility] INVASIVE CV LAB;  Service: Cardiovascular;  Laterality: N/A;   LEFT HEART CATH AND CORONARY ANGIOGRAPHY N/A 02/08/2020   Procedure: LEFT HEART CATH AND CORONARY ANGIOGRAPHY;  Surgeon: Millicent Ally, MD;  Location: [facility] INVASIVE CV LAB;  Service: Cardiovascular;  Laterality: N/A;   PACEMAKER INSERTION  11/2005   PACEMAKER LEAD REMOVAL N/A 10/17/2014   Procedure: PACEMAKER LEAD REMOVAL;  Surgeon: Tammie Fall, MD;  Location: [facility];  Service: Cardiovascular;  Laterality: N/A;   RENAL ANGIOGRAPHY Left 04/16/2018   Procedure: RENAL ANGIOGRAPHY;  Surgeon: Arvil Lauber, MD;  Location: [facility] INVASIVE CV LAB;  Service: Cardiovascular;  Laterality: Left;   RIGHT/LEFT HEART CATH AND CORONARY/GRAFT ANGIOGRAPHY N/A 07/31/2022   Procedure: RIGHT/LEFT HEART CATH AND CORONARY/GRAFT ANGIOGRAPHY;  Surgeon: Odie Benne, MD;  Location: [facility] INVASIVE CV LAB;  Service: Cardiovascular;  Laterality: N/A;   s/p ICD placement      Medtronic Maximo [phone] single chamber   TEE WITHOUT CARDIOVERSION N/A 02/11/2020   Procedure: TRANSESOPHAGEAL ECHOCARDIOGRAM (TEE);  Surgeon: Rudine Cos, MD;  Location: [facility] OR;  Service: Open Heart Surgery;  Laterality: N/A;   Family History  Problem Relation Age of Onset   Arthritis Mother    Stroke Father    Coronary artery disease Maternal Aunt    Heart failure Maternal Aunt    Lung cancer Maternal Aunt    Lung cancer Maternal Uncle    Cancer Brother        Mouth   Cancer Sister        throat   Social History   Socioeconomic History   Marital status: Married    Spouse name: Not on file   Number of children: 2   Years of education: Not on file   Highest education level: 10th grade   Occupational History   Occupation: Grave digger--now disabled  Tobacco Use   Smoking status: Former    Current packs/day: 0.00    Average packs/day: 2.5 packs/day for 40.0 years (100.0 ttl pk-yrs)    Types: Cigarettes    Start date: 01/09/1978    Quit date: 01/09/2018    Years since quitting: 6.2    Passive exposure: Never   Smokeless tobacco:  Never   Tobacco comments:    GAVE 1-800-QUIT-NOW  Vaping Use   Vaping status: Never Used  Substance and Sexual Activity   Alcohol use: No   Drug use: No   Sexual activity: Not Currently  Other Topics Concern   Not on file  Social History Narrative   No living will   Requests wife as health care POA--alternate is daughter or son   Would accept resuscitation but doesn't want prolonged ventilation   No tube feeds if cognitively unaware   Social Drivers of Health   Financial Resource Strain: Low Risk  (04/07/2024)   Overall Financial Resource Strain (CARDIA)    Difficulty of Paying Living Expenses: Not hard at all  Food Insecurity: No Food Insecurity (04/07/2024)   Hunger Vital Sign    Worried About Running Out of Food in the Last Year: Never true    Ran Out of Food in the Last Year: Never true  Transportation Needs: No Transportation Needs (04/07/2024)   PRAPARE - Administrator, Civil Service (Medical): No    Lack of Transportation (Non-Medical): No  Physical Activity: Inactive (04/07/2024)   Exercise Vital Sign    Days of Exercise per Week: 0 days    Minutes of Exercise per Session: 0 min  Stress: Stress Concern Present (04/07/2024)   [employer] of Occupational Health - Occupational Stress Questionnaire    Feeling of Stress : To some extent  Social Connections: Moderately Isolated (04/07/2024)   Social Connection and Isolation Panel [NHANES]    Frequency of Communication with Friends and Family: More than three times a week    Frequency of Social Gatherings with Friends and Family: More than three times a week     Attends Religious Services: Never    Database administrator or Organizations: No    Attends Engineer, structural: Never    Marital Status: Married    Tobacco Counseling Counseling given: Not Answered Tobacco comments: GAVE 1-800-QUIT-NOW    Clinical Intake:  Pre-visit preparation completed: Yes  Pain : 0-10 Pain Score: 10-Worst pain ever Pain Type: Chronic pain Pain Location: Back Pain Orientation: Lower Pain Descriptors / Indicators: Aching Pain Onset: More than a month ago Pain Frequency: Constant Pain Relieving Factors: rest helps, medication Effect of Pain on Daily Activities: limits activities;says he cannot do much  Pain Relieving Factors: rest helps, medication  BMI - recorded: 37.16 Nutritional Status: BMI > 30  Obese Nutritional Risks: None Diabetes: Yes CBG done?: No Did pt. bring in CBG monitor from home?: No  Lab Results  Component Value Date   HGBA1C 6.5 (A) 02/24/2024   HGBA1C 7.8 (A) 11/24/2023   HGBA1C 8.3 (H) 05/21/2023     How often do you need to have someone help you when you read instructions, pamphlets, or other written materials from your doctor or pharmacy?: 1 - Never  Interpreter Needed?: No  Comments: lives with wife Information entered by :: B.Quintara Bost,LPN   Activities of Daily Living     04/07/2024    8:36 AM  In your present state of health, do you have any difficulty performing the following activities:  Hearing? 1  Vision? 1  Difficulty concentrating or making decisions? 0  Walking or climbing stairs? 1  Dressing or bathing? 0  Doing errands, shopping? 1  Preparing Food and eating ? N  Using the Toilet? N  In the past six months, have you accidently leaked urine? N  Do you have problems with loss  of bowel control? N  Managing your Medications? N  Managing your Finances? N  Housekeeping or managing your Housekeeping? Y    Patient Care Team: Karie Schwalbe, MD as PCP - General Rollene Rotunda, MD as PCP -  Cardiology (Cardiology) Blair Promise, OD (Optometry)  Indicate any recent Medical Services you may have received from other than [facility] providers in the past year (date may be approximate).     Assessment:   This is a routine wellness examination for [place].  Hearing/Vision screen Hearing Screening - Comments:: Pt says his hearing is good with hearing aids Vision Screening - Comments:: Pt says his vision is good w/bifocals [facility]   Goals Addressed             This Visit's Progress    Patient Stated       If there is something I can to do to be more active and manage back/pain better       Depression Screen     04/07/2024    8:31 AM 05/21/2023   10:53 AM 05/21/2023   10:21 AM 05/14/2022    8:21 AM 05/09/2021   10:10 AM 07/18/2020   10:51 AM 01/19/2019    9:40 AM  PHQ 2/9 Scores  PHQ - 2 Score 0 1 0 0 0 0 4  PHQ- 9 Score      0 10    Fall Risk     04/07/2024    8:20 AM 05/21/2023   10:53 AM 05/21/2023   10:21 AM 05/14/2022    8:21 AM 05/09/2021   10:10 AM  Fall Risk   Falls in the past year? 1 1 1 1 1   Number falls in past yr: 1 1 1 1 1   Injury with Fall? 0 0 0 0 0  Risk for fall due to : Impaired mobility;Impaired balance/gait  Impaired mobility    Follow up Falls prevention discussed;Education provided Falls prevention discussed Falls evaluation completed Falls prevention discussed Follow up appointment    MEDICARE RISK AT HOME:  Medicare Risk at Home Any stairs in or around the home?: No (ramp) If so, are there any without handrails?: No Home free of loose throw rugs in walkways, pet beds, electrical cords, etc?: Yes Adequate lighting in your home to reduce risk of falls?: Yes Life alert?: No Use of a cane, walker or w/c?: Yes Grab bars in the bathroom?: Yes Shower chair or bench in shower?: Yes Elevated toilet seat or a handicapped toilet?: Yes  TIMED UP AND GO:  Was the test performed?  No  Cognitive Function: 6CIT completed         04/07/2024    8:37 AM  6CIT Screen  What Year? 0 points  What month? 0 points  What time? 0 points  Count back from 20 0 points  Months in reverse 4 points  Repeat phrase 0 points  Total Score 4 points    Immunizations Immunization History  Administered Date(s) Administered   Fluad Quad(high Dose 65+) 11/09/2021, 11/19/2022   Fluad Trivalent(High Dose 65+) 10/02/2023, 11/24/2023   Influenza Split 10/26/2012   Influenza Whole 10/13/2007, 10/16/2009, 10/17/2010   Influenza,inj,Quad PF,6+ Mos 10/29/2013, 10/28/2014, 11/29/2015, 11/25/2016, 10/28/2017, 12/04/2018, 12/10/2019, 11/08/2020   Moderna SARS-COV2 Booster Vaccination 12/12/2020   Moderna Sars-Covid-2 Vaccination 03/23/2020, 04/22/2020   PNEUMOCOCCAL CONJUGATE-20 05/21/2023   Pneumococcal Conjugate-13 10/28/2014   Pneumococcal Polysaccharide-23 10/13/2007   Td 12/24/2005   Tdap 12/04/2018   Zoster Recombinant(Shingrix) 08/23/2022, 01/23/2023    Screening Tests  Health Maintenance  Topic Date Due   Hepatitis C Screening  Never done   Colonoscopy  05/03/2015   Lung Cancer Screening  04/24/2023   Diabetic kidney evaluation - eGFR measurement  05/20/2024   Diabetic kidney evaluation - Urine ACR  05/20/2024   OPHTHALMOLOGY EXAM  05/21/2024   INFLUENZA VACCINE  07/23/2024   HEMOGLOBIN A1C  08/26/2024   FOOT EXAM  02/23/2025   Medicare Annual Wellness (AWV)  04/07/2025   DTaP/Tdap/Td (3 - Td or Tdap) 12/04/2028   Pneumonia Vaccine 59+ Years old  Completed   Zoster Vaccines- Shingrix  Completed   HPV VACCINES  Aged Out   Meningococcal B Vaccine  Aged Out   COVID-19 Vaccine  Discontinued    Health Maintenance  Health Maintenance Due  Topic Date Due   Hepatitis C Screening  Never done   Colonoscopy  05/03/2015   Lung Cancer Screening  04/24/2023   Health Maintenance Items Addressed: Colorectal Cancer Screening: Patient declined colorectal cancer screening   Additional Screening:  Vision Screening: Recommended  annual ophthalmology exams for early detection of glaucoma and other disorders of the eye.  Dental Screening: Recommended annual dental exams for proper oral hygiene  Community Resource Referral / Chronic Care Management: CRR required this visit?  Yes   CCM required this visit?  No    Plan:     I have personally reviewed and noted the following in the patient's chart:   Medical and social history Use of alcohol, tobacco or illicit drugs  Current medications and supplements including opioid prescriptions. Patient is currently taking opioid prescriptions. Information provided to patient regarding non-opioid alternatives. Patient advised to discuss non-opioid treatment plan with their provider. Functional ability and status Nutritional status Physical activity Advanced directives List of other physicians Hospitalizations, surgeries, and ER visits in previous 12 months Vitals Screenings to include cognitive, depression, and falls Referrals and appointments  In addition, I have reviewed and discussed with patient certain preventive protocols, quality metrics, and best practice recommendations. A written personalized care plan for preventive services as well as general preventive health recommendations were provided to patient.    Nerissa Bannister, LPN   12/25/7251   After Visit Summary: (MyChart) Due to this being a telephonic visit, the after visit summary with patients personalized plan was offered to patient via MyChart   Notes: Please refer to Routing Comments.  The patient relays his diificulty with managing pain and how is quality of life and mobility is decreased. He relays he only takes Tramadol at night because he picks up grands from school and does not want to be drowsy. Pt is interested in what he can take in morning to help manage pain. He also talks about his falls, imbalance and can walk/stand for limited times. He desires a scooter if he can have assistance through  insurance or programs. Referral placed for [facility] Care Management to help improve his quality of life and mobility as well as manage his chronic diseases. I am unaware what has been done in the past but this is what we discussed. His next appt 06/28/24 with PCP

## 2024-04-07 NOTE — Patient Instructions (Signed)
 [name] , Thank you for taking time to come for your Medicare Wellness Visit. I appreciate your ongoing commitment to your health goals. Please review the following plan we discussed and let me know if I can assist you in the future.   Referrals/Orders/Follow-Ups/Clinician Recommendations:   A referral has been placed for you to check and see what additional resources are available to you.   If you haven't heard from anyone within the next 7 business days, please call them and let them know a referral has been placed  Phone: [phone]     This is a list of the screening recommended for you and due dates:  Health Maintenance  Topic Date Due   Hepatitis C Screening  Never done   Colon Cancer Screening  05/03/2015   Screening for Lung Cancer  04/24/2023   Yearly kidney function blood test for diabetes  05/20/2024   Yearly kidney health urinalysis for diabetes  05/20/2024   Eye exam for diabetics  05/21/2024   Flu Shot  07/23/2024   Hemoglobin A1C  08/26/2024   Complete foot exam   02/23/2025   Medicare Annual Wellness Visit  04/07/2025   DTaP/Tdap/Td vaccine (3 - Td or Tdap) 12/04/2028   Pneumonia Vaccine  Completed   Zoster (Shingles) Vaccine  Completed   HPV Vaccine  Aged Out   Meningitis B Vaccine  Aged Out   COVID-19 Vaccine  Discontinued    Advanced directives: (Copy Requested) Please bring a copy of your health care power of attorney and living will to the office to be added to your chart at your convenience. You can mail to [facility] [address] [place], [place] [address] or email to [email or web address]  Next Medicare Annual Wellness Visit scheduled for next year: Yes 04/08/25 @ 8:10am televisit

## 2024-04-12 ENCOUNTER — Telehealth: Payer: Self-pay | Admitting: *Deleted

## 2024-04-12 NOTE — Progress Notes (Signed)
 Complex Care Management Note  Care Guide Note 04/12/2024 Name: EFREN KROSS MRN: [id number] DOB: 1956-05-20  FRANCIS DOENGES is a 68 y.o. year old male who sees Helaine Llanos, MD for primary care. I reached out to Lum Salina by phone today to offer complex care management services.  Mr. Robarts was given information about Complex Care Management services today including:   The Complex Care Management services include support from the care team which includes your Nurse Care Manager, Clinical Social Worker, or Pharmacist.  The Complex Care Management team is here to help remove barriers to the health concerns and goals most important to you. Complex Care Management services are voluntary, and the patient may decline or stop services at any time by request to their care team member.   Complex Care Management Consent Status: Patient did not agree to participate in complex care management services at this time.  Encounter Outcome:  Patient Refused  Kandis Ormond, CMA [place]  [facility], [facility] Guide Direct Dial: [phone]  Fax: [phone] Website: [email or web address]

## 2024-04-14 ENCOUNTER — Other Ambulatory Visit: Payer: Self-pay | Admitting: Physician Assistant

## 2024-04-15 ENCOUNTER — Encounter: Payer: Self-pay | Admitting: Physical Medicine and Rehabilitation

## 2024-04-16 ENCOUNTER — Other Ambulatory Visit: Payer: Self-pay | Admitting: Physical Medicine and Rehabilitation

## 2024-04-16 MED ORDER — TRAMADOL HCL 50 MG PO TABS: 50.0000 mg | ORAL_TABLET | Freq: Three times a day (TID) | ORAL | 0 refills | Status: AC | PRN

## 2024-04-26 ENCOUNTER — Encounter: Payer: Self-pay | Admitting: Physical Medicine and Rehabilitation

## 2024-04-26 ENCOUNTER — Ambulatory Visit: Admitting: Physical Medicine and Rehabilitation

## 2024-04-26 DIAGNOSIS — M47816 Spondylosis without myelopathy or radiculopathy, lumbar region: Secondary | ICD-10-CM

## 2024-04-26 DIAGNOSIS — M5441 Lumbago with sciatica, right side: Secondary | ICD-10-CM

## 2024-04-26 DIAGNOSIS — G8929 Other chronic pain: Secondary | ICD-10-CM | POA: Diagnosis not present

## 2024-04-26 DIAGNOSIS — M5416 Radiculopathy, lumbar region: Secondary | ICD-10-CM | POA: Diagnosis not present

## 2024-04-26 DIAGNOSIS — M48061 Spinal stenosis, lumbar region without neurogenic claudication: Secondary | ICD-10-CM | POA: Diagnosis not present

## 2024-04-26 NOTE — Progress Notes (Signed)
 Pain Scale   Average Pain 9 Patient advising his back pain is not any better and his pain is constant.        +Driver, -BT, -Dye Allergies.

## 2024-04-26 NOTE — Progress Notes (Signed)
 [name] - 68 y.o. male MRN [id number]  Date of birth: Oct 25, 1956  Office Visit Note: Visit Date: 04/26/2024 PCP: Helaine Llanos, MD Referred by: Helaine Llanos, MD  Subjective: Chief Complaint  Patient presents with   Lower Back - Pain   HPI: [name] is a 68 y.o. male who comes in today for evaluation of chronic, worsening and severe right sided lower back pain, pain does radiate to right lateral hip region. Pain ongoing for several years. His pain worsens with standing and activity. He describes pain as sore and aching sensation, currently rates as 8 out of 10. States his lower back feels extremely stiff in the morning after waking up. Some relief of pain with home exercise regimen, rest and use of medications. No history of dedicated formal physical therapy for lower back. Lumbar MRI imaging from 2024 shows bilateral facet arthritis at L4-L5 and L5-S1. There is also bilateral lateral recess and foraminal stenosis at L4-L5 that could be symptomatic. No high grade spinal canal stenosis noted. Patient underwent bilateral L4-L5 and L5-S1 radiofrequency ablation in our office on 03/09/2024. He reports minimal relief of pain with this procedure. Patient denies focal weakness, numbness and tingling. No recent trauma or falls. He is currently using rolling walker to assist with ambulation.   His wife is concerned Metformin  could be contributing to his symptoms. Her friend recently discontinued Metformin  due to increased lower back pain.   Patient does have significant cardiac history, he is currently taking Plavix .      Review of Systems  Musculoskeletal:  Positive for back pain and myalgias.  Neurological:  Negative for tingling, sensory change, focal weakness and weakness.  All other systems reviewed and are negative.  Otherwise per HPI.  Assessment & Plan: Visit Diagnoses:    ICD-10-CM   1. Chronic right-sided low back pain with right-sided sciatica  M54.41 Ambulatory  referral to Physical Medicine Rehab   G89.29     2. Lumbar radiculopathy  M54.16 Ambulatory referral to Physical Medicine Rehab    3. Stenosis of lateral recess of lumbar spine  M48.061 Ambulatory referral to Physical Medicine Rehab    4. Foraminal stenosis of lumbar region  M48.061 Ambulatory referral to Physical Medicine Rehab    5. Facet arthropathy, lumbar  M47.816 Ambulatory referral to Physical Medicine Rehab       Plan: Findings:  Chronic, worsening and severe right sided lower back pain radiating to right lateral hip region. Patient continues to have severe pain despite good conservative therapies such as home exercise regimen, rest and use of medications. Patients clinical presentation and exam are consistent with lumbar radiculopathy. There is bilateral lateral recess and foraminal stenosis at the level of L4-L5. These findings could certainly be a pain generator for him. Minimal relief of pain with recent lumbar radiofrequency ablation. We discussed treatment plan in detail today. Next step is to perform diagnostic and hopefully therapeutic right L4 transforaminal epidural steroid injection under fluoroscopic guidance. If good relief of pain we can repeat this procedure infrequently as needed. I also discussed re-grouping with formal physical therapy with a focus on manual treatments and core strengthening. He does have myofascial tenderness to bilateral lumbar paraspinal regions upon palpation today. We will get him in for injection. No red flag symptoms noted upon exam today.     Meds & Orders: No orders of the defined types were placed in this encounter.   Orders Placed This Encounter  Procedures   Ambulatory referral  to Physical Medicine Rehab    Follow-up: Return for Right L4 tranforaminal epidural steroid injection.   Procedures: No procedures performed      Clinical History: Narrative & Impression CLINICAL DATA:  Spinal stenosis, lumbar. Low back pain with right leg  radicular symptoms over the last 3 months.   EXAM: MRI LUMBAR SPINE WITHOUT CONTRAST   TECHNIQUE: Multiplanar, multisequence MR imaging of the lumbar spine was performed. No intravenous contrast was administered.   COMPARISON:  Radiography 11/03/2023   FINDINGS: Segmentation:  5 lumbar type vertebral bodies assumed.   Alignment:  Minimal scoliotic curvature convex to the left.   Vertebrae: Discogenic edematous changes of the endplates at L2-3 which could relate to regional pain.   Conus medullaris and cauda equina: Conus extends to the L1 level. Conus and cauda equina appear normal.   Paraspinal and other soft tissues: Previous aortoiliac aneurysms with stent graft treatment. Not primarily or completely evaluated.   Disc levels:   T12-L1 and L1-2: Mild noncompressive disc bulges.   L2-3: Disc degeneration with circumferential bulging. Focal anterior right paracentral herniation. Mild narrowing of the lateral recesses but no visible neural compression. As noted above, discogenic endplate marrow changes could relate to regional pain.   L3-4: Minimal disc bulge and facet hypertrophy.  No stenosis.   L4-5: Endplate osteophytes and bulging of the disc. Bilateral facet degeneration and hypertrophy with joint effusions. Stenosis of both lateral recesses and neural foramina that could be symptomatic. This appearance could worsen with standing or flexion.   L5-S1: Mild bulging of the disc. Bilateral facet osteoarthritis. No central canal stenosis. Bilateral foraminal narrowing, worse on the left. Some potential the left L5 nerve could be irritated as it exits the narrowed foramen.   IMPRESSION: 1. L2-3: Disc degeneration with circumferential bulging. Focal anterior right paracentral herniation. Mild narrowing of the lateral recesses but no visible neural compression. Discogenic endplate marrow changes could relate to regional pain. 2. L4-5: Endplate osteophytes and bulging  of the disc. Bilateral facet degeneration and hypertrophy with joint effusions. Stenosis of both lateral recesses and neural foramina that could be symptomatic. This appearance could worsen with standing or flexion. The facet arthropathy could additionally be a cause of back pain or referred facet syndrome pain. 3. L5-S1: Disc bulge. Bilateral facet osteoarthritis. Bilateral foraminal narrowing, worse on the left. Some potential the left L5 nerve could be irritated as it exits the narrowed foramen.     Electronically Signed   By: Bettylou Brunner M.D.   On: 12/07/2023 13:52   He reports that he quit smoking about 6 years ago. His smoking use included cigarettes. He started smoking about 46 years ago. He has a 100 pack-year smoking history. He has never been exposed to tobacco smoke. He has never used smokeless tobacco.  Recent Labs    05/21/23 1130 11/24/23 1032 02/24/24 0837  HGBA1C 8.3* 7.8* 6.5*    Objective:  VS:  HT:    WT:   BMI:     BP:   HR: bpm  TEMP: ( )  RESP:  Physical Exam Vitals and nursing note reviewed.  HENT:     Head: Normocephalic and atraumatic.     Right Ear: External ear normal.     Left Ear: External ear normal.     Nose: Nose normal.     Mouth/Throat:     Mouth: Mucous membranes are moist.  Eyes:     Extraocular Movements: Extraocular movements intact.  Cardiovascular:     Rate  and Rhythm: Normal rate.     Pulses: Normal pulses.  Pulmonary:     Effort: Pulmonary effort is normal.  Abdominal:     General: Abdomen is flat. There is no distension.  Musculoskeletal:        General: Tenderness present.     Cervical back: Normal range of motion.     Comments: Patient rises from seated position to standing without difficulty. Good lumbar range of motion. No pain noted with facet loading. 5/5 strength noted with bilateral hip flexion, knee flexion/extension, ankle dorsiflexion/plantarflexion and EHL. No clonus noted bilaterally. No pain upon palpation  of greater trochanters. No pain with internal/external rotation of bilateral hips. Sensation intact bilaterally. Negative slump test bilaterally. Ambulates with rolling walker, antalgic gait noted.    Skin:    General: Skin is warm and dry.     Capillary Refill: Capillary refill takes less than 2 seconds.  Neurological:     Mental Status: He is alert and oriented to person, place, and time.     Gait: Gait abnormal.  Psychiatric:        Mood and Affect: Mood normal.        Behavior: Behavior normal.     Ortho Exam  Imaging: No results found.  Past Medical/Family/Surgical/Social History: Medications & Allergies reviewed per EMR, new medications updated. Patient Active Problem List   Diagnosis Date Noted   Low back pain 07/24/2023   Dysuria 07/24/2023   Strain of right knee 06/10/2023   Coronary artery disease of native artery of native heart with stable angina pectoris (HCC)    ILD (interstitial lung disease) (HCC) 05/21/2022   Pulmonary nodules 02/16/2021   Ischemic cardiomyopathy 11/29/2020   PAF (paroxysmal atrial fibrillation) (HCC) 03/01/2020   S/P CABG x 4 02/11/2020   Atherosclerotic heart disease of native coronary artery with angina pectoris (HCC) 02/08/2020   Osteoarthritis of right shoulder 12/10/2019   Type 2 diabetes mellitus with diabetic neuropathy (HCC) 01/19/2019   PVD (peripheral vascular disease) (HCC) 10/13/2018   PVC's (premature ventricular contractions) 10/13/2018   Iliac artery occlusion (HCC) 03/19/2018   Aneurysm of iliac artery (HCC) 03/06/2018   AAA (abdominal aortic aneurysm) without rupture (HCC)    Bilateral carotid artery stenosis 04/28/2017   Chronic pain 04/14/2017   Essential hypertension 09/17/2016   CAD S/P percutaneous coronary angioplasty 09/17/2016   Mood disorder (HCC) 11/29/2015   Routine general medical examination at a health care facility 10/26/2012   Atherosclerosis of native coronary artery with angina pectoris (HCC) 10/17/2011    OSA (obstructive sleep apnea) 08/28/2011   Morbid obesity (HCC) 08/27/2011   Chronic systolic heart failure (HCC) 08/06/2011   B12 DEFICIENCY 04/14/2009   URINARY INCONTINENCE 05/27/2008   COPD (chronic obstructive pulmonary disease) with emphysema (HCC) 02/09/2008   Dyslipidemia, goal LDL below 70 10/13/2007   DIVERTICULOSIS, COLON 09/15/2007   History of colonic polyps 09/15/2007   Type 2 diabetes, controlled, with peripheral circulatory disorder (HCC) 05/25/2007   GERD 05/25/2007   Reflux esophagitis 04/23/2007   Past Medical History:  Diagnosis Date   Arthritis    Automatic implantable cardioverter-defibrillator in situ    Blood transfusion without reported diagnosis    CHF (congestive heart failure) (HCC)    COPD (chronic obstructive pulmonary disease) (HCC)    emphysema by CXR   Coronary artery disease    Diabetes mellitus    type 2  no meds   Diverticulosis of colon    Emphysema of lung (HCC)    GERD (  gastroesophageal reflux disease)    with esophagitis   History of colonic polyps    Hypertension    Non-ischemic cardiomyopathy (HCC)    Presence of permanent cardiac pacemaker    PVD (peripheral vascular disease) (HCC)    bilateral common iliac artery aneurysms. Right SFA occlusion over a long segment. Left SFA disease with occlusion of the left TP trunk. Artirogram Oct. 2006   Sleep apnea    Tobacco abuse    Urinary incontinence    detrussor instability   Family History  Problem Relation Age of Onset   Arthritis Mother    Stroke Father    Coronary artery disease Maternal Aunt    Heart failure Maternal Aunt    Lung cancer Maternal Aunt    Lung cancer Maternal Uncle    Cancer Brother        Mouth   Cancer Sister        throat   Past Surgical History:  Procedure Laterality Date   ABDOMINAL AORTIC ENDOVASCULAR STENT GRAFT N/A 05/07/2018   Procedure: ABDOMINAL AORTIC ENDOVASCULAR STENT GRAFT;  Surgeon: Arvil Lauber, MD;  Location: [facility] OR;  Service: Vascular;   Laterality: N/A;   CARDIAC CATHETERIZATION  06/2004   negative   CARDIAC CATHETERIZATION  02/08/2020   CLIPPING OF ATRIAL APPENDAGE N/A 02/11/2020   Procedure: Clipping Of Atrial Appendage using AtriCure 35 MM AtriClip.;  Surgeon: Rudine Cos, MD;  Location: [facility] OR;  Service: Open Heart Surgery;  Laterality: N/A;   COLONOSCOPY  04/2005   COPD exacerbation     CORONARY ANGIOPLASTY  01/09/2018   CORONARY ARTERY BYPASS GRAFT N/A 02/11/2020   Procedure: CORONARY ARTERY BYPASS GRAFTING (CABG) using LIMA to LAD; RIMA to PL; Endoscopic right greater saphenous vein harvest: SVC to Diag; SVC to OM1.;  Surgeon: Rudine Cos, MD;  Location: [facility] OR;  Service: Open Heart Surgery;  Laterality: N/A;  BILATERAL IMA   CORONARY STENT INTERVENTION N/A 01/10/2018   Procedure: CORONARY STENT INTERVENTION;  Surgeon: Avanell Leigh, MD;  Location: [facility] INVASIVE CV LAB;  Service: Cardiovascular;  Laterality: N/A;   CORONARY/GRAFT ACUTE MI REVASCULARIZATION N/A 01/10/2018   Procedure: Coronary/Graft Acute MI Revascularization;  Surgeon: Avanell Leigh, MD;  Location: [facility] INVASIVE CV LAB;  Service: Cardiovascular;  Laterality: N/A;   EMBOLIZATION Left 03/19/2018   EMBOLIZATION (CATH LAB) Left 03/19/2018   Procedure: EMBOLIZATION - Left Internal;  Surgeon: Arvil Lauber, MD;  Location: [facility] INVASIVE CV LAB;  Service: Cardiovascular;  Laterality: Left;   EMBOLIZATION (CATH LAB) Right 04/16/2018   Procedure: EMBOLIZATION;  Surgeon: Arvil Lauber, MD;  Location: [facility] INVASIVE CV LAB;  Service: Cardiovascular;  Laterality: Right;   ENDOVEIN HARVEST OF GREATER SAPHENOUS VEIN Right 02/11/2020   Procedure: Astrid Blamer Of Greater Saphenous Vein using right lower extremity.;  Surgeon: Rudine Cos, MD;  Location: [facility] OR;  Service: Open Heart Surgery;  Laterality: Right;   FEMORAL ARTERY EXPLORATION Right 05/07/2018   Procedure: FEMORAL ARTERY EXPLORATION, EXTENDED PROFUNDAPLASTY;  Surgeon: Arvil Lauber, MD;  Location:  [facility] OR;  Service: Vascular;  Laterality: Right;   INTRAOPERATIVE ARTERIOGRAM Right 05/07/2018   Procedure: INTRA OPERATIVE ARTERIOGRAM;  Surgeon: Arvil Lauber, MD;  Location: [facility] OR;  Service: Vascular;  Laterality: Right;   LEFT HEART CATH AND CORONARY ANGIOGRAPHY N/A 01/10/2018   Procedure: LEFT HEART CATH AND CORONARY ANGIOGRAPHY;  Surgeon: Avanell Leigh, MD;  Location: [facility] INVASIVE CV LAB;  Service: Cardiovascular;  Laterality: N/A;   LEFT  HEART CATH AND CORONARY ANGIOGRAPHY N/A 02/08/2020   Procedure: LEFT HEART CATH AND CORONARY ANGIOGRAPHY;  Surgeon: Millicent Ally, MD;  Location: [facility] INVASIVE CV LAB;  Service: Cardiovascular;  Laterality: N/A;   PACEMAKER INSERTION  11/2005   PACEMAKER LEAD REMOVAL N/A 10/17/2014   Procedure: PACEMAKER LEAD REMOVAL;  Surgeon: Tammie Fall, MD;  Location: [facility] OR;  Service: Cardiovascular;  Laterality: N/A;   RENAL ANGIOGRAPHY Left 04/16/2018   Procedure: RENAL ANGIOGRAPHY;  Surgeon: Arvil Lauber, MD;  Location: [facility] INVASIVE CV LAB;  Service: Cardiovascular;  Laterality: Left;   RIGHT/LEFT HEART CATH AND CORONARY/GRAFT ANGIOGRAPHY N/A 07/31/2022   Procedure: RIGHT/LEFT HEART CATH AND CORONARY/GRAFT ANGIOGRAPHY;  Surgeon: Odie Benne, MD;  Location: [facility] INVASIVE CV LAB;  Service: Cardiovascular;  Laterality: N/A;   s/p ICD placement      Medtronic Maximo [phone] single chamber   TEE WITHOUT CARDIOVERSION N/A 02/11/2020   Procedure: TRANSESOPHAGEAL ECHOCARDIOGRAM (TEE);  Surgeon: Rudine Cos, MD;  Location: [facility] OR;  Service: Open Heart Surgery;  Laterality: N/A;   Social History   Occupational History   Occupation: Grave digger--now disabled  Tobacco Use   Smoking status: Former    Current packs/day: 0.00    Average packs/day: 2.5 packs/day for 40.0 years (100.0 ttl pk-yrs)    Types: Cigarettes    Start date: 01/09/1978    Quit date: 01/09/2018    Years since quitting: 6.3    Passive exposure: Never   Smokeless tobacco: Never   Tobacco  comments:    GAVE [id number]  Vaping Use   Vaping status: Never Used  Substance and Sexual Activity   Alcohol use: No   Drug use: No   Sexual activity: Not Currently

## 2024-05-03 DIAGNOSIS — E119 Type 2 diabetes mellitus without complications: Secondary | ICD-10-CM | POA: Diagnosis not present

## 2024-05-03 DIAGNOSIS — H2523 Age-related cataract, morgagnian type, bilateral: Secondary | ICD-10-CM | POA: Diagnosis not present

## 2024-05-03 DIAGNOSIS — H52213 Irregular astigmatism, bilateral: Secondary | ICD-10-CM | POA: Diagnosis not present

## 2024-05-03 DIAGNOSIS — H524 Presbyopia: Secondary | ICD-10-CM | POA: Diagnosis not present

## 2024-05-03 LAB — HM DIABETES EYE EXAM

## 2024-05-06 ENCOUNTER — Ambulatory Visit: Admitting: Physical Medicine and Rehabilitation

## 2024-05-06 ENCOUNTER — Other Ambulatory Visit: Payer: Self-pay

## 2024-05-06 VITALS — BP 105/71 | HR 85

## 2024-05-06 DIAGNOSIS — M5416 Radiculopathy, lumbar region: Secondary | ICD-10-CM | POA: Diagnosis not present

## 2024-05-06 MED ORDER — METHYLPREDNISOLONE ACETATE 40 MG/ML IJ SUSP
40.0000 mg | Freq: Once | INTRAMUSCULAR | Status: AC
  Administered 2024-05-06: 40 mg

## 2024-05-06 NOTE — Progress Notes (Signed)
 Pain Scale   Average Pain 6 Patient advises he suffers from Chronic lower back pain without relief. Patient states when he get up in the AM it is very pain full and throughout the day it lessens . However, pain never goes away.        +Driver, -BT, -Dye Allergies.

## 2024-05-06 NOTE — Patient Instructions (Signed)

## 2024-05-14 NOTE — Procedures (Signed)
 Lumbosacral Transforaminal Epidural Steroid Injection - Sub-Pedicular Approach with Fluoroscopic Guidance  Patient: [name]      Date of Birth: 12-17-1956 MRN: [id number] PCP: Helaine Llanos, MD      Visit Date: 05/06/2024   Universal Protocol:    Date/Time: 05/06/2024  Consent Given By: the patient  Position: PRONE  Additional Comments: Vital signs were monitored before and after the procedure. Patient was prepped and draped in the usual sterile fashion. The correct patient, procedure, and site was verified.   Injection Procedure Details:   Procedure diagnoses: Lumbar radiculopathy [M54.16]    Meds Administered:  Meds ordered this encounter  Medications   methylPREDNISolone  acetate (DEPO-MEDROL ) injection 40 mg    Laterality: Right  Location/Site: L4  Needle:5.0 in., 22 ga.  Short bevel or Quincke spinal needle  Needle Placement: Transforaminal  Findings:    -Comments: Excellent flow of contrast along the nerve, nerve root and into the epidural space.  Procedure Details: After squaring off the end-plates to get a true AP view, the C-arm was positioned so that an oblique view of the foramen as noted above was visualized. The target area is just inferior to the "nose of the scotty dog" or sub pedicular. The soft tissues overlying this structure were infiltrated with 2-3 ml. of 1% Lidocaine  without Epinephrine .  The spinal needle was inserted toward the target using a "trajectory" view along the fluoroscope beam.  Under AP and lateral visualization, the needle was advanced so it did not puncture dura and was located close the 6 O'Clock position of the pedical in AP tracterory. Biplanar projections were used to confirm position. Aspiration was confirmed to be negative for CSF and/or blood. A 1-2 ml. volume of Isovue -250 was injected and flow of contrast was noted at each level. Radiographs were obtained for documentation purposes.   After attaining the desired  flow of contrast documented above, a 0.5 to 1.0 ml test dose of 0.25% Marcaine  was injected into each respective transforaminal space.  The patient was observed for 90 seconds post injection.  After no sensory deficits were reported, and normal lower extremity motor function was noted,   the above injectate was administered so that equal amounts of the injectate were placed at each foramen (level) into the transforaminal epidural space.   Additional Comments:  The patient tolerated the procedure well Dressing: 2 x 2 sterile gauze and Band-Aid    Post-procedure details: Patient was observed during the procedure. Post-procedure instructions were reviewed.  Patient left the clinic in stable condition.

## 2024-05-14 NOTE — Progress Notes (Signed)
 [name] - 68 y.o. male MRN [id number]  Date of birth: 1956/08/27  Office Visit Note: Visit Date: 05/06/2024 PCP: Helaine Llanos, MD Referred by: Helaine Llanos, MD  Subjective: Chief Complaint  Patient presents with   Lower Back - Pain   HPI:  [name] is a 68 y.o. male who comes in today at the request of [name] Hamel, FNP for planned Right L4-5 Lumbar Transforaminal epidural steroid injection with fluoroscopic guidance.  The patient has failed conservative care including home exercise, medications, time and activity modification.  This injection will be diagnostic and hopefully therapeutic.  Please see requesting physician notes for further details and justification.   ROS Otherwise per HPI.  Assessment & Plan: Visit Diagnoses:    ICD-10-CM   1. Lumbar radiculopathy  M54.16 XR C-ARM NO REPORT    Epidural Steroid injection    methylPREDNISolone  acetate (DEPO-MEDROL ) injection 40 mg      Plan: No additional findings.   Meds & Orders:  Meds ordered this encounter  Medications   methylPREDNISolone  acetate (DEPO-MEDROL ) injection 40 mg    Orders Placed This Encounter  Procedures   XR C-ARM NO REPORT   Epidural Steroid injection    Follow-up: Return if symptoms worsen or fail to improve.   Procedures: No procedures performed  Lumbosacral Transforaminal Epidural Steroid Injection - Sub-Pedicular Approach with Fluoroscopic Guidance  Patient: [name]      Date of Birth: January 14, 1956 MRN: [id number] PCP: Helaine Llanos, MD      Visit Date: 05/06/2024   Universal Protocol:    Date/Time: 05/06/2024  Consent Given By: the patient  Position: PRONE  Additional Comments: Vital signs were monitored before and after the procedure. Patient was prepped and draped in the usual sterile fashion. The correct patient, procedure, and site was verified.   Injection Procedure Details:   Procedure diagnoses: Lumbar radiculopathy [M54.16]    Meds  Administered:  Meds ordered this encounter  Medications   methylPREDNISolone  acetate (DEPO-MEDROL ) injection 40 mg    Laterality: Right  Location/Site: L4  Needle:5.0 in., 22 ga.  Short bevel or Quincke spinal needle  Needle Placement: Transforaminal  Findings:    -Comments: Excellent flow of contrast along the nerve, nerve root and into the epidural space.  Procedure Details: After squaring off the end-plates to get a true AP view, the C-arm was positioned so that an oblique view of the foramen as noted above was visualized. The target area is just inferior to the "nose of the scotty dog" or sub pedicular. The soft tissues overlying this structure were infiltrated with 2-3 ml. of 1% Lidocaine  without Epinephrine .  The spinal needle was inserted toward the target using a "trajectory" view along the fluoroscope beam.  Under AP and lateral visualization, the needle was advanced so it did not puncture dura and was located close the 6 O'Clock position of the pedical in AP tracterory. Biplanar projections were used to confirm position. Aspiration was confirmed to be negative for CSF and/or blood. A 1-2 ml. volume of Isovue -250 was injected and flow of contrast was noted at each level. Radiographs were obtained for documentation purposes.   After attaining the desired flow of contrast documented above, a 0.5 to 1.0 ml test dose of 0.25% Marcaine  was injected into each respective transforaminal space.  The patient was observed for 90 seconds post injection.  After no sensory deficits were reported, and normal lower extremity motor function was noted,   the above injectate  was administered so that equal amounts of the injectate were placed at each foramen (level) into the transforaminal epidural space.   Additional Comments:  The patient tolerated the procedure well Dressing: 2 x 2 sterile gauze and Band-Aid    Post-procedure details: Patient was observed during the procedure. Post-procedure  instructions were reviewed.  Patient left the clinic in stable condition.    Clinical History: Narrative & Impression CLINICAL DATA:  Spinal stenosis, lumbar. Low back pain with right leg radicular symptoms over the last 3 months.   EXAM: MRI LUMBAR SPINE WITHOUT CONTRAST   TECHNIQUE: Multiplanar, multisequence MR imaging of the lumbar spine was performed. No intravenous contrast was administered.   COMPARISON:  Radiography 11/03/2023   FINDINGS: Segmentation:  5 lumbar type vertebral bodies assumed.   Alignment:  Minimal scoliotic curvature convex to the left.   Vertebrae: Discogenic edematous changes of the endplates at L2-3 which could relate to regional pain.   Conus medullaris and cauda equina: Conus extends to the L1 level. Conus and cauda equina appear normal.   Paraspinal and other soft tissues: Previous aortoiliac aneurysms with stent graft treatment. Not primarily or completely evaluated.   Disc levels:   T12-L1 and L1-2: Mild noncompressive disc bulges.   L2-3: Disc degeneration with circumferential bulging. Focal anterior right paracentral herniation. Mild narrowing of the lateral recesses but no visible neural compression. As noted above, discogenic endplate marrow changes could relate to regional pain.   L3-4: Minimal disc bulge and facet hypertrophy.  No stenosis.   L4-5: Endplate osteophytes and bulging of the disc. Bilateral facet degeneration and hypertrophy with joint effusions. Stenosis of both lateral recesses and neural foramina that could be symptomatic. This appearance could worsen with standing or flexion.   L5-S1: Mild bulging of the disc. Bilateral facet osteoarthritis. No central canal stenosis. Bilateral foraminal narrowing, worse on the left. Some potential the left L5 nerve could be irritated as it exits the narrowed foramen.   IMPRESSION: 1. L2-3: Disc degeneration with circumferential bulging. Focal anterior right paracentral  herniation. Mild narrowing of the lateral recesses but no visible neural compression. Discogenic endplate marrow changes could relate to regional pain. 2. L4-5: Endplate osteophytes and bulging of the disc. Bilateral facet degeneration and hypertrophy with joint effusions. Stenosis of both lateral recesses and neural foramina that could be symptomatic. This appearance could worsen with standing or flexion. The facet arthropathy could additionally be a cause of back pain or referred facet syndrome pain. 3. L5-S1: Disc bulge. Bilateral facet osteoarthritis. Bilateral foraminal narrowing, worse on the left. Some potential the left L5 nerve could be irritated as it exits the narrowed foramen.     Electronically Signed   By: Bettylou Brunner M.D.   On: 12/07/2023 13:52     Objective:  VS:  HT:    WT:   BMI:     BP:105/71  HR:85bpm  TEMP: ( )  RESP:  Physical Exam Vitals and nursing note reviewed.  Constitutional:      General: He is not in acute distress.    Appearance: Normal appearance. He is not ill-appearing.  HENT:     Head: Normocephalic and atraumatic.     Right Ear: External ear normal.     Left Ear: External ear normal.     Nose: No congestion.  Eyes:     Extraocular Movements: Extraocular movements intact.  Cardiovascular:     Rate and Rhythm: Normal rate.     Pulses: Normal pulses.  Pulmonary:  Effort: Pulmonary effort is normal. No respiratory distress.  Abdominal:     General: There is no distension.     Palpations: Abdomen is soft.  Musculoskeletal:        General: No tenderness or signs of injury.     Cervical back: Neck supple.     Right lower leg: No edema.     Left lower leg: No edema.     Comments: Patient has good distal strength without clonus.  Skin:    Findings: No erythema or rash.  Neurological:     General: No focal deficit present.     Mental Status: He is alert and oriented to person, place, and time.     Sensory: No sensory deficit.      Motor: No weakness or abnormal muscle tone.     Coordination: Coordination normal.  Psychiatric:        Mood and Affect: Mood normal.        Behavior: Behavior normal.      Imaging: No results found.

## 2024-05-18 ENCOUNTER — Other Ambulatory Visit: Payer: Self-pay | Admitting: Internal Medicine

## 2024-05-27 ENCOUNTER — Ambulatory Visit: Admitting: Physical Medicine and Rehabilitation

## 2024-05-27 ENCOUNTER — Other Ambulatory Visit: Payer: Self-pay | Admitting: Cardiology

## 2024-05-27 DIAGNOSIS — I5022 Chronic systolic (congestive) heart failure: Secondary | ICD-10-CM

## 2024-05-27 DIAGNOSIS — I2510 Atherosclerotic heart disease of native coronary artery without angina pectoris: Secondary | ICD-10-CM

## 2024-05-27 DIAGNOSIS — I200 Unstable angina: Secondary | ICD-10-CM

## 2024-05-27 DIAGNOSIS — E785 Hyperlipidemia, unspecified: Secondary | ICD-10-CM

## 2024-05-27 DIAGNOSIS — I10 Essential (primary) hypertension: Secondary | ICD-10-CM

## 2024-05-27 DIAGNOSIS — E1140 Type 2 diabetes mellitus with diabetic neuropathy, unspecified: Secondary | ICD-10-CM

## 2024-05-27 DIAGNOSIS — Z951 Presence of aortocoronary bypass graft: Secondary | ICD-10-CM

## 2024-06-08 ENCOUNTER — Encounter: Payer: Self-pay | Admitting: Physical Medicine and Rehabilitation

## 2024-06-08 ENCOUNTER — Ambulatory Visit: Admitting: Physical Medicine and Rehabilitation

## 2024-06-08 DIAGNOSIS — G8929 Other chronic pain: Secondary | ICD-10-CM

## 2024-06-08 DIAGNOSIS — M47816 Spondylosis without myelopathy or radiculopathy, lumbar region: Secondary | ICD-10-CM

## 2024-06-08 DIAGNOSIS — M545 Low back pain, unspecified: Secondary | ICD-10-CM | POA: Diagnosis not present

## 2024-06-08 DIAGNOSIS — M47817 Spondylosis without myelopathy or radiculopathy, lumbosacral region: Secondary | ICD-10-CM | POA: Diagnosis not present

## 2024-06-08 MED ORDER — TRAMADOL HCL 50 MG PO TABS: 50.0000 mg | ORAL_TABLET | Freq: Three times a day (TID) | ORAL | 0 refills | Status: DC | PRN

## 2024-06-08 NOTE — Progress Notes (Signed)
 [name] - 68 y.o. male MRN [id number]  Date of birth: 1956/01/17  Office Visit Note: Visit Date: 06/08/2024 PCP: Helaine Llanos, MD Referred by: Helaine Llanos, MD  Subjective: Chief Complaint  Patient presents with   Lower Back - Follow-up   HPI: [name] is a 68 y.o. male who comes in today for evaluation of chronic, worsening and severe right sided lower back pain, intermittent radiation to buttock and hip. Intermittent paresthesias to right leg. Pain ongoing for several years. His pain worsens with standing and activity. His pain is most severe in the morning. He describes pain as sore and aching sensation, currently rates as 8 out of 10. States his lower back feels extremely stiff in the morning after waking up. Some relief of pain with home exercise regimen, rest and use of medications. He did attend 1 session of formal physical therapy several years ago that increased his pain. Lumbar MRI imaging from 2024 shows bilateral facet arthritis at L4-L5 and L5-S1. There is also bilateral lateral recess and foraminal stenosis at L4-L5 that could be symptomatic. No high grade spinal canal stenosis noted. Patient underwent bilateral L4-L5 and L5-S1 radiofrequency ablation in our office on 03/09/2024. He reports minimal relief of pain with this procedure. More recently, he underwent right L4 transforaminal epidural steroid injection in our office on 05/06/2024, he reports minimal relief of pain with injection for about 2 days. He is currently using rolling walker to assist with ambulation. Patient denies focal weakness. No recent trauma or falls.      Review of Systems  Musculoskeletal:  Positive for back pain.  Neurological:  Positive for tingling. Negative for focal weakness and weakness.  All other systems reviewed and are negative.  Otherwise per HPI.  Assessment & Plan: Visit Diagnoses:    ICD-10-CM   1. Chronic right-sided low back pain without sciatica  M54.50     G89.29     2. Spondylosis without myelopathy or radiculopathy, lumbosacral region  M47.817     3. Facet arthropathy, lumbar  M47.816        Plan: Findings:  Chronic, worsening and severe right sided lower back pain, intermittent radiation to buttock and hip. Intermittent paresthesias to right leg. Patient continues to have severe pain despite good conservative therapies such as formal physical therapy, home exercise regimen, rest and use of medications.  Patient's clinical presentation today are consistent with more facet mediated pain.  He has undergone bilateral L4-L5 and L5-S1 radiofrequency ablation in our office back in March with minimal relief of pain.  More recently we completed a transforaminal injection, also with minimal relief of pain.  Patient is not really interested in continuing with injection therapy at this time.  We did discuss treatment options in detail today.  Patient would like to continue with Tramadol  as needed.  I did go ahead and place a referral to [facility] for chronic pain management.  I do feel he would benefit from regrouping with formal physical therapy, however he is not interested in PT at this time.  We are happy to see him back as needed.  No red flag symptoms noted upon exam today.    Meds & Orders: No orders of the defined types were placed in this encounter.  No orders of the defined types were placed in this encounter.   Follow-up: Return if symptoms worsen or fail to improve.   Procedures: No procedures performed      Clinical History:  Narrative & Impression CLINICAL DATA:  Spinal stenosis, lumbar. Low back pain with right leg radicular symptoms over the last 3 months.   EXAM: MRI LUMBAR SPINE WITHOUT CONTRAST   TECHNIQUE: Multiplanar, multisequence MR imaging of the lumbar spine was performed. No intravenous contrast was administered.   COMPARISON:  Radiography 11/03/2023   FINDINGS: Segmentation:  5 lumbar type vertebral  bodies assumed.   Alignment:  Minimal scoliotic curvature convex to the left.   Vertebrae: Discogenic edematous changes of the endplates at L2-3 which could relate to regional pain.   Conus medullaris and cauda equina: Conus extends to the L1 level. Conus and cauda equina appear normal.   Paraspinal and other soft tissues: Previous aortoiliac aneurysms with stent graft treatment. Not primarily or completely evaluated.   Disc levels:   T12-L1 and L1-2: Mild noncompressive disc bulges.   L2-3: Disc degeneration with circumferential bulging. Focal anterior right paracentral herniation. Mild narrowing of the lateral recesses but no visible neural compression. As noted above, discogenic endplate marrow changes could relate to regional pain.   L3-4: Minimal disc bulge and facet hypertrophy.  No stenosis.   L4-5: Endplate osteophytes and bulging of the disc. Bilateral facet degeneration and hypertrophy with joint effusions. Stenosis of both lateral recesses and neural foramina that could be symptomatic. This appearance could worsen with standing or flexion.   L5-S1: Mild bulging of the disc. Bilateral facet osteoarthritis. No central canal stenosis. Bilateral foraminal narrowing, worse on the left. Some potential the left L5 nerve could be irritated as it exits the narrowed foramen.   IMPRESSION: 1. L2-3: Disc degeneration with circumferential bulging. Focal anterior right paracentral herniation. Mild narrowing of the lateral recesses but no visible neural compression. Discogenic endplate marrow changes could relate to regional pain. 2. L4-5: Endplate osteophytes and bulging of the disc. Bilateral facet degeneration and hypertrophy with joint effusions. Stenosis of both lateral recesses and neural foramina that could be symptomatic. This appearance could worsen with standing or flexion. The facet arthropathy could additionally be a cause of back pain or referred facet syndrome  pain. 3. L5-S1: Disc bulge. Bilateral facet osteoarthritis. Bilateral foraminal narrowing, worse on the left. Some potential the left L5 nerve could be irritated as it exits the narrowed foramen.     Electronically Signed   By: Bettylou Brunner M.D.   On: 12/07/2023 13:52   He reports that he quit smoking about 6 years ago. His smoking use included cigarettes. He started smoking about 46 years ago. He has a 100 pack-year smoking history. He has never been exposed to tobacco smoke. He has never used smokeless tobacco.  Recent Labs    11/24/23 1032 02/24/24 0837  HGBA1C 7.8* 6.5*    Objective:  VS:  HT:    WT:   BMI:     BP:   HR: bpm  TEMP: ( )  RESP:  Physical Exam Vitals and nursing note reviewed.  HENT:     Head: Normocephalic and atraumatic.     Right Ear: External ear normal.     Left Ear: External ear normal.     Nose: Nose normal.     Mouth/Throat:     Mouth: Mucous membranes are moist.   Eyes:     Extraocular Movements: Extraocular movements intact.    Cardiovascular:     Rate and Rhythm: Normal rate.     Pulses: Normal pulses.  Pulmonary:     Effort: Pulmonary effort is normal.  Abdominal:     General:  Abdomen is flat. There is no distension.   Musculoskeletal:        General: Tenderness present.     Cervical back: Normal range of motion.     Comments: Patient rises from seated position to standing without difficulty. Pain noted with facet loading and lumbar extension. 5/5 strength noted with bilateral hip flexion, knee flexion/extension, ankle dorsiflexion/plantarflexion and EHL. No clonus noted bilaterally. No pain upon palpation of greater trochanters. No pain with internal/external rotation of bilateral hips. Sensation intact bilaterally. Myofascial tenderness noted to bilateral lumbar paraspinal regions upon palpation. Negative slump test bilaterally. Ambulates with walker, gait slow and unsteady.      Skin:    General: Skin is warm and dry.      Capillary Refill: Capillary refill takes less than 2 seconds.   Neurological:     General: No focal deficit present.     Mental Status: He is alert and oriented to person, place, and time.   Psychiatric:        Mood and Affect: Mood normal.        Behavior: Behavior normal.     Ortho Exam  Imaging: No results found.  Past Medical/Family/Surgical/Social History: Medications & Allergies reviewed per EMR, new medications updated. Patient Active Problem List   Diagnosis Date Noted   Low back pain 07/24/2023   Dysuria 07/24/2023   Strain of right knee 06/10/2023   Coronary artery disease of native artery of native heart with stable angina pectoris (HCC)    ILD (interstitial lung disease) (HCC) 05/21/2022   Pulmonary nodules 02/16/2021   Ischemic cardiomyopathy 11/29/2020   PAF (paroxysmal atrial fibrillation) (HCC) 03/01/2020   S/P CABG x 4 02/11/2020   Atherosclerotic heart disease of native coronary artery with angina pectoris (HCC) 02/08/2020   Osteoarthritis of right shoulder 12/10/2019   Type 2 diabetes mellitus with diabetic neuropathy (HCC) 01/19/2019   PVD (peripheral vascular disease) (HCC) 10/13/2018   PVC's (premature ventricular contractions) 10/13/2018   Iliac artery occlusion (HCC) 03/19/2018   Aneurysm of iliac artery (HCC) 03/06/2018   AAA (abdominal aortic aneurysm) without rupture (HCC)    Bilateral carotid artery stenosis 04/28/2017   Chronic pain 04/14/2017   Essential hypertension 09/17/2016   CAD S/P percutaneous coronary angioplasty 09/17/2016   Mood disorder (HCC) 11/29/2015   Routine general medical examination at a health care facility 10/26/2012   Atherosclerosis of native coronary artery with angina pectoris (HCC) 10/17/2011   OSA (obstructive sleep apnea) 08/28/2011   Morbid obesity (HCC) 08/27/2011   Chronic systolic heart failure (HCC) 08/06/2011   B12 DEFICIENCY 04/14/2009   URINARY INCONTINENCE 05/27/2008   COPD (chronic obstructive  pulmonary disease) with emphysema (HCC) 02/09/2008   Dyslipidemia, goal LDL below 70 10/13/2007   DIVERTICULOSIS, COLON 09/15/2007   History of colonic polyps 09/15/2007   Type 2 diabetes, controlled, with peripheral circulatory disorder (HCC) 05/25/2007   GERD 05/25/2007   Reflux esophagitis 04/23/2007   Past Medical History:  Diagnosis Date   Arthritis    Automatic implantable cardioverter-defibrillator in situ    Blood transfusion without reported diagnosis    CHF (congestive heart failure) (HCC)    COPD (chronic obstructive pulmonary disease) (HCC)    emphysema by CXR   Coronary artery disease    Diabetes mellitus    type 2  no meds   Diverticulosis of colon    Emphysema of lung (HCC)    GERD (gastroesophageal reflux disease)    with esophagitis   History of colonic polyps  Hypertension    Non-ischemic cardiomyopathy (HCC)    Presence of permanent cardiac pacemaker    PVD (peripheral vascular disease) (HCC)    bilateral common iliac artery aneurysms. Right SFA occlusion over a long segment. Left SFA disease with occlusion of the left TP trunk. Artirogram Oct. 2006   Sleep apnea    Tobacco abuse    Urinary incontinence    detrussor instability   Family History  Problem Relation Age of Onset   Arthritis Mother    Stroke Father    Coronary artery disease Maternal Aunt    Heart failure Maternal Aunt    Lung cancer Maternal Aunt    Lung cancer Maternal Uncle    Cancer Brother        Mouth   Cancer Sister        throat   Past Surgical History:  Procedure Laterality Date   ABDOMINAL AORTIC ENDOVASCULAR STENT GRAFT N/A 05/07/2018   Procedure: ABDOMINAL AORTIC ENDOVASCULAR STENT GRAFT;  Surgeon: Arvil Lauber, MD;  Location: [facility] OR;  Service: Vascular;  Laterality: N/A;   CARDIAC CATHETERIZATION  06/2004   negative   CARDIAC CATHETERIZATION  02/08/2020   CLIPPING OF ATRIAL APPENDAGE N/A 02/11/2020   Procedure: Clipping Of Atrial Appendage using AtriCure 35 MM  AtriClip.;  Surgeon: Rudine Cos, MD;  Location: [facility] OR;  Service: Open Heart Surgery;  Laterality: N/A;   COLONOSCOPY  04/2005   COPD exacerbation     CORONARY ANGIOPLASTY  01/09/2018   CORONARY ARTERY BYPASS GRAFT N/A 02/11/2020   Procedure: CORONARY ARTERY BYPASS GRAFTING (CABG) using LIMA to LAD; RIMA to PL; Endoscopic right greater saphenous vein harvest: SVC to Diag; SVC to OM1.;  Surgeon: Rudine Cos, MD;  Location: [facility] OR;  Service: Open Heart Surgery;  Laterality: N/A;  BILATERAL IMA   CORONARY STENT INTERVENTION N/A 01/10/2018   Procedure: CORONARY STENT INTERVENTION;  Surgeon: Avanell Leigh, MD;  Location: [facility] INVASIVE CV LAB;  Service: Cardiovascular;  Laterality: N/A;   CORONARY/GRAFT ACUTE MI REVASCULARIZATION N/A 01/10/2018   Procedure: Coronary/Graft Acute MI Revascularization;  Surgeon: Avanell Leigh, MD;  Location: [facility] INVASIVE CV LAB;  Service: Cardiovascular;  Laterality: N/A;   EMBOLIZATION Left 03/19/2018   EMBOLIZATION (CATH LAB) Left 03/19/2018   Procedure: EMBOLIZATION - Left Internal;  Surgeon: Arvil Lauber, MD;  Location: [facility] INVASIVE CV LAB;  Service: Cardiovascular;  Laterality: Left;   EMBOLIZATION (CATH LAB) Right 04/16/2018   Procedure: EMBOLIZATION;  Surgeon: Arvil Lauber, MD;  Location: [facility] INVASIVE CV LAB;  Service: Cardiovascular;  Laterality: Right;   ENDOVEIN HARVEST OF GREATER SAPHENOUS VEIN Right 02/11/2020   Procedure: Astrid Blamer Of Greater Saphenous Vein using right lower extremity.;  Surgeon: Rudine Cos, MD;  Location: [facility] OR;  Service: Open Heart Surgery;  Laterality: Right;   FEMORAL ARTERY EXPLORATION Right 05/07/2018   Procedure: FEMORAL ARTERY EXPLORATION, EXTENDED PROFUNDAPLASTY;  Surgeon: Arvil Lauber, MD;  Location: [facility] OR;  Service: Vascular;  Laterality: Right;   INTRAOPERATIVE ARTERIOGRAM Right 05/07/2018   Procedure: INTRA OPERATIVE ARTERIOGRAM;  Surgeon: Arvil Lauber, MD;  Location: [facility] OR;  Service: Vascular;   Laterality: Right;   LEFT HEART CATH AND CORONARY ANGIOGRAPHY N/A 01/10/2018   Procedure: LEFT HEART CATH AND CORONARY ANGIOGRAPHY;  Surgeon: Avanell Leigh, MD;  Location: [facility] INVASIVE CV LAB;  Service: Cardiovascular;  Laterality: N/A;   LEFT HEART CATH AND CORONARY ANGIOGRAPHY N/A 02/08/2020   Procedure: LEFT HEART CATH AND CORONARY ANGIOGRAPHY;  Surgeon: Millicent Ally, MD;  Location: [facility] INVASIVE CV LAB;  Service: Cardiovascular;  Laterality: N/A;   PACEMAKER INSERTION  11/2005   PACEMAKER LEAD REMOVAL N/A 10/17/2014   Procedure: PACEMAKER LEAD REMOVAL;  Surgeon: Tammie Fall, MD;  Location: [facility] OR;  Service: Cardiovascular;  Laterality: N/A;   RENAL ANGIOGRAPHY Left 04/16/2018   Procedure: RENAL ANGIOGRAPHY;  Surgeon: Arvil Lauber, MD;  Location: [facility] INVASIVE CV LAB;  Service: Cardiovascular;  Laterality: Left;   RIGHT/LEFT HEART CATH AND CORONARY/GRAFT ANGIOGRAPHY N/A 07/31/2022   Procedure: RIGHT/LEFT HEART CATH AND CORONARY/GRAFT ANGIOGRAPHY;  Surgeon: Odie Benne, MD;  Location: [facility] INVASIVE CV LAB;  Service: Cardiovascular;  Laterality: N/A;   s/p ICD placement      Medtronic Maximo [phone] single chamber   TEE WITHOUT CARDIOVERSION N/A 02/11/2020   Procedure: TRANSESOPHAGEAL ECHOCARDIOGRAM (TEE);  Surgeon: Rudine Cos, MD;  Location: [facility] OR;  Service: Open Heart Surgery;  Laterality: N/A;   Social History   Occupational History   Occupation: Grave digger--now disabled  Tobacco Use   Smoking status: Former    Current packs/day: 0.00    Average packs/day: 2.5 packs/day for 40.0 years (100.0 ttl pk-yrs)    Types: Cigarettes    Start date: 01/09/1978    Quit date: 01/09/2018    Years since quitting: 6.4    Passive exposure: Never   Smokeless tobacco: Never   Tobacco comments:    [id number]  Vaping Use   Vaping status: Never Used  Substance and Sexual Activity   Alcohol use: No   Drug use: No   Sexual activity: Not Currently

## 2024-06-08 NOTE — Progress Notes (Signed)
 Pain Scale   Average Pain 7 Patient advising the injection he got helped for aprox 3 to 4 days and then pain returned.        +Driver, -BT, -Dye Allergies.

## 2024-06-10 DIAGNOSIS — G4733 Obstructive sleep apnea (adult) (pediatric): Secondary | ICD-10-CM | POA: Diagnosis not present

## 2024-06-12 ENCOUNTER — Other Ambulatory Visit: Payer: Self-pay | Admitting: Physical Medicine and Rehabilitation

## 2024-06-14 ENCOUNTER — Other Ambulatory Visit: Payer: Self-pay | Admitting: Physical Medicine and Rehabilitation

## 2024-06-28 ENCOUNTER — Ambulatory Visit (INDEPENDENT_AMBULATORY_CARE_PROVIDER_SITE_OTHER): Admitting: Internal Medicine

## 2024-06-28 ENCOUNTER — Ambulatory Visit: Payer: Self-pay | Admitting: Internal Medicine

## 2024-06-28 ENCOUNTER — Encounter: Payer: Self-pay | Admitting: Internal Medicine

## 2024-06-28 VITALS — BP 124/72 | HR 53 | Temp 98.2°F | Ht 70.0 in | Wt 243.0 lb

## 2024-06-28 DIAGNOSIS — J439 Emphysema, unspecified: Secondary | ICD-10-CM

## 2024-06-28 DIAGNOSIS — Z0000 Encounter for general adult medical examination without abnormal findings: Secondary | ICD-10-CM

## 2024-06-28 DIAGNOSIS — E1151 Type 2 diabetes mellitus with diabetic peripheral angiopathy without gangrene: Secondary | ICD-10-CM | POA: Diagnosis not present

## 2024-06-28 DIAGNOSIS — I480 Paroxysmal atrial fibrillation: Secondary | ICD-10-CM | POA: Diagnosis not present

## 2024-06-28 DIAGNOSIS — Z7985 Long-term (current) use of injectable non-insulin antidiabetic drugs: Secondary | ICD-10-CM

## 2024-06-28 DIAGNOSIS — I5022 Chronic systolic (congestive) heart failure: Secondary | ICD-10-CM | POA: Diagnosis not present

## 2024-06-28 DIAGNOSIS — I25119 Atherosclerotic heart disease of native coronary artery with unspecified angina pectoris: Secondary | ICD-10-CM

## 2024-06-28 LAB — HEPATIC FUNCTION PANEL
ALT: 14 U/L (ref 0–53)
AST: 16 U/L (ref 0–37)
Albumin: 4.1 g/dL (ref 3.5–5.2)
Alkaline Phosphatase: 72 U/L (ref 39–117)
Bilirubin, Direct: 0.2 mg/dL (ref 0.0–0.3)
Total Bilirubin: 0.7 mg/dL (ref 0.2–1.2)
Total Protein: 6.5 g/dL (ref 6.0–8.3)

## 2024-06-28 LAB — CBC
HCT: 43.2 % (ref 39.0–52.0)
Hemoglobin: 14.4 g/dL (ref 13.0–17.0)
MCHC: 33.3 g/dL (ref 30.0–36.0)
MCV: 73.7 fl — ABNORMAL LOW (ref 78.0–100.0)
Platelets: 281 K/uL (ref 150.0–400.0)
RBC: 5.86 Mil/uL — ABNORMAL HIGH (ref 4.22–5.81)
RDW: 19.5 % — ABNORMAL HIGH (ref 11.5–15.5)
WBC: 7.9 K/uL (ref 4.0–10.5)

## 2024-06-28 LAB — RENAL FUNCTION PANEL
Albumin: 4.1 g/dL (ref 3.5–5.2)
BUN: 18 mg/dL (ref 6–23)
CO2: 27 meq/L (ref 19–32)
Calcium: 9.1 mg/dL (ref 8.4–10.5)
Chloride: 106 meq/L (ref 96–112)
Creatinine, Ser: 1.11 mg/dL (ref 0.40–1.50)
GFR: 68.67 mL/min (ref >60.00)
Glucose, Bld: 101 mg/dL — ABNORMAL HIGH (ref 70–99)
Phosphorus: 4 mg/dL (ref 2.3–4.6)
Potassium: 5 meq/L (ref 3.5–5.1)
Sodium: 141 meq/L (ref 135–145)

## 2024-06-28 LAB — LIPID PANEL
Cholesterol: 94 mg/dL (ref 0–200)
HDL: 33.2 mg/dL — ABNORMAL LOW (ref >39.00)
LDL Cholesterol: 24 mg/dL (ref 0–99)
NonHDL: 60.62
Total CHOL/HDL Ratio: 3
Triglycerides: 181 mg/dL — ABNORMAL HIGH (ref 0.0–149.0)
VLDL: 36.2 mg/dL (ref 0.0–40.0)

## 2024-06-28 LAB — HEMOGLOBIN A1C: Hgb A1c MFr Bld: 6.6 % — ABNORMAL HIGH (ref 4.6–6.5)

## 2024-06-28 NOTE — Assessment & Plan Note (Signed)
 Seems to still be well controlled Marked weight loss on ozempic  1mg  weekly

## 2024-06-28 NOTE — Assessment & Plan Note (Signed)
 Doing okay Still prefers no cancer screening Flu vaccine in the fall---prefers no COVID and thinks he had RSV Discussed exercise

## 2024-06-28 NOTE — Assessment & Plan Note (Signed)
 Compensated---especially with weight loss

## 2024-06-28 NOTE — Assessment & Plan Note (Signed)
 Stable angina pattern On isosorbide  and occasional nitroglycerin  tabs

## 2024-06-28 NOTE — Assessment & Plan Note (Signed)
Stable DOE on breztri

## 2024-06-28 NOTE — Progress Notes (Signed)
 Subjective:    Patient ID: [name], male    DOB: 07-25-1956, 68 y.o.   MRN: [id number]  HPI Here for physical  Doing okay Checks sugars every morning--has been good  Had some chest pain at times--nitroglycerin  helps Belches and it will improve (so not sure if it might be heartburn). Some pain with swallowing food Pantoprazole  daily Hasn't needed lately No palpitations Walks with rollator  Breathing okay Gardens daily---250 tomatoes, 100 squash, etc in elevated beds  Ongoing back pain Starting with pain management  Current Outpatient Medications on File Prior to Visit  Medication Sig Dispense Refill   albuterol  (VENTOLIN  HFA) 108 (90 Base) MCG/ACT inhaler TAKE 2 PUFFS BY MOUTH EVERY 6 HOURS AS NEEDED FOR WHEEZE OR SHORTNESS OF BREATH 18 each 0   aspirin  81 MG chewable tablet Chew 1 tablet (81 mg total) by mouth daily.     atorvastatin  (LIPITOR ) 80 MG tablet TAKE ONE TABLET BY MOUTH ONCE A DAY AT SIX IN THE EVENING 90 tablet 3   Blood Glucose Monitoring Suppl DEVI 1 each by Does not apply route in the morning, at noon, and at bedtime. May substitute to any manufacturer covered by patient's insurance. 1 each 0   Budeson-Glycopyrrol-Formoterol (BREZTRI  AEROSPHERE) 160-9-4.8 MCG/ACT AERO Inhale 2 puffs into the lungs in the morning and at bedtime. 5 each 9   clopidogrel  (PLAVIX ) 75 MG tablet TAKE ONE TABLET BY MOUTH EVERY DAY 90 tablet 3   diclofenac Sodium (VOLTAREN) 1 % GEL Apply 1 Application topically 3 (three) times daily as needed (pain).     empagliflozin  (JARDIANCE ) 25 MG TABS tablet Take 1 tablet (25 mg total) by mouth daily. 90 tablet 3   fluticasone  (FLONASE ) 50 MCG/ACT nasal spray SPRAY 1 SPRAY INTO BOTH NOSTRILS DAILY 16 g 2   furosemide  (LASIX ) 20 MG tablet TAKE ONE TABLET BY MOUTH ONCE A DAY 90 tablet 3   gabapentin  (NEURONTIN ) 100 MG capsule TAKE ONE CAPSULE BY MOUTH THREE TIMES DAILY 90 capsule 11   glipiZIDE  (GLUCOTROL ) 5 MG tablet Take 1 tablet (5 mg total)  by mouth 2 (two) times daily before a meal. 180 tablet 3   Glucose Blood (BLOOD GLUCOSE TEST STRIPS) STRP 1 each by In Vitro route in the morning, at noon, and at bedtime. May substitute to any manufacturer covered by patient's insurance. 270 each 0   isosorbide  mononitrate (IMDUR ) 30 MG 24 hr tablet TAKE ONE TABLET BY MOUTH ONCE EVERY EVENING 90 tablet 0   metFORMIN  (GLUCOPHAGE -XR) 500 MG 24 hr tablet TAKE TWO TABLETS BY MOUTH EVERY DAY WITH BREAKFAST 180 tablet 3   metoprolol  tartrate (LOPRESSOR ) 25 MG tablet TAKE ONE TABLET BY MOUTH TWO TIMES A DAY 180 tablet 3   Multiple Vitamin (MULTIVITAMIN WITH MINERALS) TABS tablet Take 1 tablet by mouth daily.     nitroGLYCERIN  (NITROSTAT ) 0.4 MG SL tablet Place 0.4 mg under the tongue every 5 (five) minutes as needed for chest pain.     pantoprazole  (PROTONIX ) 40 MG tablet TAKE ONE TABLET BY MOUTH TWICE A DAY 180 tablet 3   sacubitril -valsartan  (ENTRESTO ) 97-103 MG Take 1 tablet by mouth 2 (two) times daily. 180 tablet 3   Semaglutide , 1 MG/DOSE, 4 MG/3ML SOPN Inject 1 mg as directed once a week. 3 mL 11   traMADol  (ULTRAM ) 50 MG tablet Take 1 tablet (50 mg total) by mouth every 8 (eight) hours as needed. 20 tablet 0   traMADol  (ULTRAM ) 50 MG tablet Take 1 tablet (50  mg total) by mouth every 8 (eight) hours as needed. 20 tablet 0   vitamin B-12 (CYANOCOBALAMIN ) 1000 MCG tablet Take 1,000 mcg by mouth daily.     No current facility-administered medications on file prior to visit.    Allergies  Allergen Reactions   Spironolactone  Other (See Comments)    hyperkalemia    Past Medical History:  Diagnosis Date   Arthritis    Automatic implantable cardioverter-defibrillator in situ    Blood transfusion without reported diagnosis    CHF (congestive heart failure) (HCC)    COPD (chronic obstructive pulmonary disease) (HCC)    emphysema by CXR   Coronary artery disease    Diabetes mellitus    type 2  no meds   Diverticulosis of colon    Emphysema  of lung (HCC)    GERD (gastroesophageal reflux disease)    with esophagitis   History of colonic polyps    Hypertension    Non-ischemic cardiomyopathy (HCC)    Presence of permanent cardiac pacemaker    PVD (peripheral vascular disease) (HCC)    bilateral common iliac artery aneurysms. Right SFA occlusion over a long segment. Left SFA disease with occlusion of the left TP trunk. Artirogram Oct. 2006   Sleep apnea    Tobacco abuse    Urinary incontinence    detrussor instability    Past Surgical History:  Procedure Laterality Date   ABDOMINAL AORTIC ENDOVASCULAR STENT GRAFT N/A 05/07/2018   Procedure: ABDOMINAL AORTIC ENDOVASCULAR STENT GRAFT;  Surgeon: Laurence Redell CROME, MD;  Location: [facility] OR;  Service: Vascular;  Laterality: N/A;   CARDIAC CATHETERIZATION  06/2004   negative   CARDIAC CATHETERIZATION  02/08/2020   CLIPPING OF ATRIAL APPENDAGE N/A 02/11/2020   Procedure: Clipping Of Atrial Appendage using AtriCure 35 MM AtriClip.;  Surgeon: German Bartlett PEDLAR, MD;  Location: [facility] OR;  Service: Open Heart Surgery;  Laterality: N/A;   COLONOSCOPY  04/2005   COPD exacerbation     CORONARY ANGIOPLASTY  01/09/2018   CORONARY ARTERY BYPASS GRAFT N/A 02/11/2020   Procedure: CORONARY ARTERY BYPASS GRAFTING (CABG) using LIMA to LAD; RIMA to PL; Endoscopic right greater saphenous vein harvest: SVC to Diag; SVC to OM1.;  Surgeon: German Bartlett PEDLAR, MD;  Location: [facility] OR;  Service: Open Heart Surgery;  Laterality: N/A;  BILATERAL IMA   CORONARY STENT INTERVENTION N/A 01/10/2018   Procedure: CORONARY STENT INTERVENTION;  Surgeon: Court Dorn PARAS, MD;  Location: [facility] INVASIVE CV LAB;  Service: Cardiovascular;  Laterality: N/A;   CORONARY/GRAFT ACUTE MI REVASCULARIZATION N/A 01/10/2018   Procedure: Coronary/Graft Acute MI Revascularization;  Surgeon: Court Dorn PARAS, MD;  Location: [facility] INVASIVE CV LAB;  Service: Cardiovascular;  Laterality: N/A;   EMBOLIZATION Left 03/19/2018   EMBOLIZATION (CATH LAB) Left  03/19/2018   Procedure: EMBOLIZATION - Left Internal;  Surgeon: Laurence Redell CROME, MD;  Location: [facility] INVASIVE CV LAB;  Service: Cardiovascular;  Laterality: Left;   EMBOLIZATION (CATH LAB) Right 04/16/2018   Procedure: EMBOLIZATION;  Surgeon: Laurence Redell CROME, MD;  Location: [facility] INVASIVE CV LAB;  Service: Cardiovascular;  Laterality: Right;   ENDOVEIN HARVEST OF GREATER SAPHENOUS VEIN Right 02/11/2020   Procedure: Jethro Rubins Of Greater Saphenous Vein using right lower extremity.;  Surgeon: German Bartlett PEDLAR, MD;  Location: [facility] OR;  Service: Open Heart Surgery;  Laterality: Right;   FEMORAL ARTERY EXPLORATION Right 05/07/2018   Procedure: FEMORAL ARTERY EXPLORATION, EXTENDED PROFUNDAPLASTY;  Surgeon: Laurence Redell CROME, MD;  Location: [facility] OR;  Service: Vascular;  Laterality: Right;   INTRAOPERATIVE ARTERIOGRAM Right 05/07/2018   Procedure: INTRA OPERATIVE ARTERIOGRAM;  Surgeon: Laurence Redell CROME, MD;  Location: [facility] OR;  Service: Vascular;  Laterality: Right;   LEFT HEART CATH AND CORONARY ANGIOGRAPHY N/A 01/10/2018   Procedure: LEFT HEART CATH AND CORONARY ANGIOGRAPHY;  Surgeon: Court Dorn PARAS, MD;  Location: [facility] INVASIVE CV LAB;  Service: Cardiovascular;  Laterality: N/A;   LEFT HEART CATH AND CORONARY ANGIOGRAPHY N/A 02/08/2020   Procedure: LEFT HEART CATH AND CORONARY ANGIOGRAPHY;  Surgeon: Burnard Debby LABOR, MD;  Location: [facility] INVASIVE CV LAB;  Service: Cardiovascular;  Laterality: N/A;   PACEMAKER INSERTION  11/2005   PACEMAKER LEAD REMOVAL N/A 10/17/2014   Procedure: PACEMAKER LEAD REMOVAL;  Surgeon: Danelle LELON Birmingham, MD;  Location: [facility] OR;  Service: Cardiovascular;  Laterality: N/A;   RENAL ANGIOGRAPHY Left 04/16/2018   Procedure: RENAL ANGIOGRAPHY;  Surgeon: Laurence Redell CROME, MD;  Location: [facility] INVASIVE CV LAB;  Service: Cardiovascular;  Laterality: Left;   RIGHT/LEFT HEART CATH AND CORONARY/GRAFT ANGIOGRAPHY N/A 07/31/2022   Procedure: RIGHT/LEFT HEART CATH AND CORONARY/GRAFT ANGIOGRAPHY;  Surgeon: Verlin Lonni BIRCH, MD;  Location: [facility] INVASIVE CV LAB;  Service: Cardiovascular;  Laterality: N/A;   s/p ICD placement      Medtronic Maximo [phone] single chamber   TEE WITHOUT CARDIOVERSION N/A 02/11/2020   Procedure: TRANSESOPHAGEAL ECHOCARDIOGRAM (TEE);  Surgeon: German Bartlett PEDLAR, MD;  Location: [facility] OR;  Service: Open Heart Surgery;  Laterality: N/A;    Family History  Problem Relation Age of Onset   Arthritis Mother    Stroke Father    Coronary artery disease Maternal Aunt    Heart failure Maternal Aunt    Lung cancer Maternal Aunt    Lung cancer Maternal Uncle    Cancer Brother        Mouth   Cancer Sister        throat    Social History   Socioeconomic History   Marital status: Married    Spouse name: Not on file   Number of children: 2   Years of education: Not on file   Highest education level: 9th grade  Occupational History   Occupation: Grave digger--now disabled  Tobacco Use   Smoking status: Former    Current packs/day: 0.00    Average packs/day: 2.5 packs/day for 40.0 years (100.0 ttl pk-yrs)    Types: Cigarettes    Start date: 01/09/1978    Quit date: 01/09/2018    Years since quitting: 6.4    Passive exposure: Never   Smokeless tobacco: Never   Tobacco comments:    GAVE 1-800-QUIT-NOW  Vaping Use   Vaping status: Never Used  Substance and Sexual Activity   Alcohol use: No   Drug use: No   Sexual activity: Not Currently  Other Topics Concern   Not on file  Social History Narrative   No living will   Requests wife as health care POA--alternate is daughter or son   Would accept resuscitation but doesn't want prolonged ventilation   No tube feeds if cognitively unaware   Social Drivers of Health   Financial Resource Strain: Low Risk  (06/27/2024)   Overall Financial Resource Strain (CARDIA)    Difficulty of Paying Living Expenses: Not hard at all  Food Insecurity: No Food Insecurity (06/27/2024)   Hunger Vital Sign    Worried About Running Out of Food in the Last  Year: Never true    Ran Out of Food in the Last Year:  Never true  Transportation Needs: No Transportation Needs (06/27/2024)   PRAPARE - Administrator, Civil Service (Medical): No    Lack of Transportation (Non-Medical): No  Physical Activity: Inactive (06/27/2024)   Exercise Vital Sign    Days of Exercise per Week: 0 days    Minutes of Exercise per Session: Not on file  Stress: No Stress Concern Present (06/27/2024)   [employer] of Occupational Health - Occupational Stress Questionnaire    Feeling of Stress: Not at all  Recent Concern: Stress - Stress Concern Present (04/07/2024)   [employer] of Occupational Health - Occupational Stress Questionnaire    Feeling of Stress : To some extent  Social Connections: Moderately Isolated (06/27/2024)   Social Connection and Isolation Panel    Frequency of Communication with Friends and Family: More than three times a week    Frequency of Social Gatherings with Friends and Family: Three times a week    Attends Religious Services: Patient declined    Active Member of Clubs or Organizations: No    Attends Banker Meetings: Not on file    Marital Status: Married  Intimate Partner Violence: Not At Risk (04/07/2024)   Humiliation, Afraid, Rape, and Kick questionnaire    Fear of Current or Ex-Partner: No    Emotionally Abused: No    Physically Abused: No    Sexually Abused: No   Review of Systems  Constitutional:        Has lost 30# lately on ozempic  Wears seat belt  HENT:  Positive for hearing loss and tinnitus.        Edentulous ---no dentures  Eyes:  Negative for visual disturbance.       New glasses  Respiratory:         Only occ cough Some wheezing/dyspnea if active--uses inhaler rarely  Cardiovascular:  Positive for chest pain. Negative for palpitations and leg swelling.  Gastrointestinal:  Negative for blood in stool and constipation.  Endocrine: Negative for polydipsia and polyuria.  Genitourinary:   Negative for difficulty urinating and urgency.       No sex---no problem  Musculoskeletal:  Positive for arthralgias and back pain. Negative for joint swelling.  Skin:  Negative for rash.  Allergic/Immunologic: Negative for immunocompromised state.       Does sneeze a lot-no meds  Neurological:  Positive for light-headedness. Negative for syncope and headaches.  Hematological:  Negative for adenopathy. Bruises/bleeds easily.  Psychiatric/Behavioral:  Positive for sleep disturbance. Negative for dysphoric mood. The patient is not nervous/anxious.        Objective:   Physical Exam Constitutional:      Appearance: Normal appearance.  HENT:     Mouth/Throat:     Pharynx: No oropharyngeal exudate or posterior oropharyngeal erythema.  Eyes:     Conjunctiva/sclera: Conjunctivae normal.     Pupils: Pupils are equal, round, and reactive to light.  Cardiovascular:     Rate and Rhythm: Normal rate.     Heart sounds: No murmur heard.    No gallop.     Comments: Feet warm without palpable pulses Slightly irregular Pulmonary:     Effort: Pulmonary effort is normal.     Breath sounds: Normal breath sounds. No wheezing or rales.  Abdominal:     Palpations: Abdomen is soft.     Tenderness: There is no abdominal tenderness.  Musculoskeletal:     Cervical back: Neck supple.     Right lower leg: No edema.  Left lower leg: No edema.  Lymphadenopathy:     Cervical: No cervical adenopathy.  Skin:    Findings: No lesion or rash.     Comments: No lesions  Neurological:     General: No focal deficit present.     Mental Status: He is alert and oriented to person, place, and time.  Psychiatric:        Mood and Affect: Mood normal.        Behavior: Behavior normal.            Assessment & Plan:

## 2024-06-28 NOTE — Assessment & Plan Note (Signed)
 Paced Not sure if atrial fib still On plavix /ASA

## 2024-06-29 LAB — MICROALBUMIN / CREATININE URINE RATIO
Creatinine,U: 72.3 mg/dL
Microalb Creat Ratio: 27.5 mg/g (ref 0.0–30.0)
Microalb, Ur: 2 mg/dL — ABNORMAL HIGH (ref 0.0–1.9)

## 2024-07-08 ENCOUNTER — Other Ambulatory Visit: Payer: Self-pay | Admitting: Cardiology

## 2024-07-08 ENCOUNTER — Other Ambulatory Visit: Payer: Self-pay | Admitting: Internal Medicine

## 2024-07-15 DIAGNOSIS — Z79899 Other long term (current) drug therapy: Secondary | ICD-10-CM | POA: Diagnosis not present

## 2024-07-15 DIAGNOSIS — Z125 Encounter for screening for malignant neoplasm of prostate: Secondary | ICD-10-CM | POA: Diagnosis not present

## 2024-07-15 DIAGNOSIS — M545 Low back pain, unspecified: Secondary | ICD-10-CM | POA: Diagnosis not present

## 2024-07-15 DIAGNOSIS — E1121 Type 2 diabetes mellitus with diabetic nephropathy: Secondary | ICD-10-CM | POA: Diagnosis not present

## 2024-07-15 DIAGNOSIS — M542 Cervicalgia: Secondary | ICD-10-CM | POA: Diagnosis not present

## 2024-07-15 DIAGNOSIS — Z6834 Body mass index (BMI) 34.0-34.9, adult: Secondary | ICD-10-CM | POA: Diagnosis not present

## 2024-07-15 DIAGNOSIS — E559 Vitamin D deficiency, unspecified: Secondary | ICD-10-CM | POA: Diagnosis not present

## 2024-07-15 DIAGNOSIS — M129 Arthropathy, unspecified: Secondary | ICD-10-CM | POA: Diagnosis not present

## 2024-07-15 DIAGNOSIS — G4700 Insomnia, unspecified: Secondary | ICD-10-CM | POA: Diagnosis not present

## 2024-07-15 DIAGNOSIS — Z131 Encounter for screening for diabetes mellitus: Secondary | ICD-10-CM | POA: Diagnosis not present

## 2024-07-15 DIAGNOSIS — R0602 Shortness of breath: Secondary | ICD-10-CM | POA: Diagnosis not present

## 2024-07-15 DIAGNOSIS — Z0000 Encounter for general adult medical examination without abnormal findings: Secondary | ICD-10-CM | POA: Diagnosis not present

## 2024-07-15 DIAGNOSIS — E6609 Other obesity due to excess calories: Secondary | ICD-10-CM | POA: Diagnosis not present

## 2024-07-15 DIAGNOSIS — M546 Pain in thoracic spine: Secondary | ICD-10-CM | POA: Diagnosis not present

## 2024-07-19 DIAGNOSIS — Z79899 Other long term (current) drug therapy: Secondary | ICD-10-CM | POA: Diagnosis not present

## 2024-08-05 ENCOUNTER — Other Ambulatory Visit: Payer: Self-pay | Admitting: Cardiology

## 2024-08-19 ENCOUNTER — Ambulatory Visit: Admitting: Cardiology

## 2024-08-20 ENCOUNTER — Other Ambulatory Visit: Payer: Self-pay | Admitting: Cardiology

## 2024-08-20 DIAGNOSIS — I200 Unstable angina: Secondary | ICD-10-CM

## 2024-08-20 DIAGNOSIS — I10 Essential (primary) hypertension: Secondary | ICD-10-CM

## 2024-08-20 DIAGNOSIS — E785 Hyperlipidemia, unspecified: Secondary | ICD-10-CM

## 2024-08-20 DIAGNOSIS — I5022 Chronic systolic (congestive) heart failure: Secondary | ICD-10-CM

## 2024-08-20 DIAGNOSIS — Z951 Presence of aortocoronary bypass graft: Secondary | ICD-10-CM

## 2024-08-20 DIAGNOSIS — E1140 Type 2 diabetes mellitus with diabetic neuropathy, unspecified: Secondary | ICD-10-CM

## 2024-08-20 DIAGNOSIS — I2510 Atherosclerotic heart disease of native coronary artery without angina pectoris: Secondary | ICD-10-CM

## 2024-09-02 NOTE — Progress Notes (Unsigned)
 Cardiology Office Note:   Date:  09/03/2024  ID:  [name], DOB [birth year], MRN [id number] PCP: Jimmy Charlie FERNS, MD  [facility] Providers Cardiologist:  Lynwood Schilling, MD {  History of Present Illness:   [name] is a 68 y.o. male who presents for follow up of CAD.  He has anatomy as below.  He has had continued chest pain and wanted medical management.  He underwent CABG x4 (LIMA-LAD; RIMA-PL; SVG-Diag; SVG-OM1) in February 2021.  He had more pain in August 2023. R/LHC on 07/31/2022 revealed severe triple-vessel CAD s/p CABG x4, severe mid LAD stenosis (mid and distal LAD filling from patent LIMA graft), severe diagonal stenosis with patent SVG-diagonal, moderate ostial circumflex stenosis with patent mid circumflex stent, occluded OM with patent vein graft to OM branch, CTO of P RCA unable to locate graft (free RIMA), no collateral filling of native RCA, graft presumed to be patent, normal R/L heart pressures.     He has seen pulmonary and is managed for sleep apnea.  They done pulmonary function testing on him.  He had an EVAR with bilateral hypogastric artery coil embolization in 2019.  He had right lower extremity acute limb ischemia requiring femoral endarterectomy with bovine pericardial patch.  This was by Dr. Laurence.    Since I last saw him he has done relatively well.  He gets out in his garden and uses a walker.  He does say he has had back problems and he is Some peripheral vascular disease.  He says he has fallen.  This really happens when he needs out in the yard makes a misstep and his walker is not beside him.  He is not describing any new cardiovascular problems.  He is not having any chest pressure, neck or arm discomfort.  Has had no new palpitations, presyncope or syncope.  He is not having any PND or orthopnea.  He has had weight loss of about 45 pounds.   ROS: As stated in the HPI and negative for all other systems.  Studies Reviewed:    EKG:   EKG  Interpretation Date/Time:  Friday September 03 2024 11:32:03 EDT Ventricular Rate:  70 PR Interval:  230 QRS Duration:  106 QT Interval:  434 QTC Calculation: 468 R Axis:   60  Text Interpretation: Sinus rhythm with 1st degree A-V block Cannot rule out Inferior infarct , age undetermined When compared with ECG of 02-Jul-2023 10:09, No significant change since last tracing Confirmed by Schilling Lynwood ([id number]) on 09/03/2024 12:01:40 PM   Risk Assessment/Calculations:              Physical Exam:   VS:  BP 106/66   Pulse 70   Ht 5' 10 (1.778 m)   Wt 243 lb (110.2 kg)   SpO2 92%   BMI 34.87 kg/m    Wt Readings from Last 3 Encounters:  09/03/24 243 lb (110.2 kg)  06/28/24 243 lb (110.2 kg)  04/07/24 259 lb (117.5 kg)     GEN: Well nourished, well developed in no acute distress NECK: No JVD; No carotid bruits CARDIAC: ***RR, *** murmurs, rubs, gallops RESPIRATORY:  Clear to auscultation without rales, wheezing or rhonchi  ABDOMEN: Soft, non-tender, non-distended EXTREMITIES:  No edema; No deformity   ASSESSMENT AND PLAN:   S/P CABG x 4 The patient has no new symptoms. He will stop ASA.   He will continue with risk reduction.  Ischemic cardiomyopathy EF was 35 to 40% in 2021.  I will check an echocardiogram to follow-up.  He seems to be euvolemic.  His blood pressure will probably not allow med titration.  COPD (chronic obstructive pulmonary disease) with emphysema [facility]):  He has some chronic dyspnea.  No change in therapy.  Essential hypertension This is being managed in context of treating his heart failure.   Type 2 diabetes mellitus with diabetic neuropathy (HCC) A1c was A1c was 9.1 and now 6.6.  I congratulated him on weight loss.   OSA (obstructive sleep apnea) He uses CPAP.   PVD He is being managed medically.  No change in therapy.   MORBID OBESITY I congratulated him on weight loss and encouraged more diet control to accompany the medicines.  No change in  therapy.   Follow up with me in 1 year.  Signed, Lynwood Schilling, MD

## 2024-09-03 ENCOUNTER — Encounter: Payer: Self-pay | Admitting: Cardiology

## 2024-09-03 ENCOUNTER — Ambulatory Visit: Attending: Cardiology | Admitting: Cardiology

## 2024-09-03 VITALS — BP 106/66 | HR 70 | Ht 70.0 in | Wt 243.0 lb

## 2024-09-03 DIAGNOSIS — E1140 Type 2 diabetes mellitus with diabetic neuropathy, unspecified: Secondary | ICD-10-CM

## 2024-09-03 DIAGNOSIS — I10 Essential (primary) hypertension: Secondary | ICD-10-CM

## 2024-09-03 DIAGNOSIS — I5022 Chronic systolic (congestive) heart failure: Secondary | ICD-10-CM

## 2024-09-03 DIAGNOSIS — I429 Cardiomyopathy, unspecified: Secondary | ICD-10-CM | POA: Diagnosis not present

## 2024-09-03 DIAGNOSIS — Z951 Presence of aortocoronary bypass graft: Secondary | ICD-10-CM

## 2024-09-03 DIAGNOSIS — E118 Type 2 diabetes mellitus with unspecified complications: Secondary | ICD-10-CM

## 2024-09-03 MED ORDER — FUROSEMIDE 20 MG PO TABS: 20.0000 mg | ORAL_TABLET | Freq: Every day | ORAL | 3 refills | Status: AC

## 2024-09-03 NOTE — Patient Instructions (Addendum)
 Medication Instructions:  Refilled Lasix  Your physician recommends that you continue on your current medications as directed. Please refer to the Current Medication list given to you today.  *If you need a refill on your cardiac medications before your next appointment, please call your pharmacy*  Lab Work: NONE If you have labs (blood work) drawn today and your tests are completely normal, you will receive your results only by: MyChart Message (if you have MyChart) OR A paper copy in the mail If you have any lab test that is abnormal or we need to change your treatment, we will call you to review the results.  Testing/Procedures: Echocardiogram Your physician has requested that you have an echocardiogram. Echocardiography is a painless test that uses sound waves to create images of your heart. It provides your doctor with information about the size and shape of your heart and how well your heart's chambers and valves are working. This procedure takes approximately one hour. There are no restrictions for this procedure. Please do NOT wear cologne, perfume, aftershave, or lotions (deodorant is allowed). Please arrive 15 minutes prior to your appointment time.  Please note: We ask at that you not bring children with you during ultrasound (echo/ vascular) testing. Due to room size and safety concerns, children are not allowed in the ultrasound rooms during exams. Our front office staff cannot provide observation of children in our lobby area while testing is being conducted. An adult accompanying a patient to their appointment will only be allowed in the ultrasound room at the discretion of the ultrasound technician under special circumstances. We apologize for any inconvenience.   Follow-Up: At [facility], you and your health needs are our priority.  As part of our continuing mission to provide you with exceptional heart care, our providers are all part of one team.  This team  includes your primary Cardiologist (physician) and Advanced Practice Providers or APPs (Physician Assistants and Nurse Practitioners) who all work together to provide you with the care you need, when you need it.  Your next appointment:   1 year  Provider:   [place], MD  We recommend signing up for the patient portal called MyChart.  Sign up information is provided on this After Visit Summary.  MyChart is used to connect with patients for Virtual Visits (Telemedicine).  Patients are able to view lab/test results, encounter notes, upcoming appointments, etc.  Non-urgent messages can be sent to your provider as well.   To learn more about what you can do with MyChart, go to [email or web address].

## 2024-09-27 NOTE — Progress Notes (Signed)
 [name]                                          MRN: [id number]   09/27/2024   The VBCI Quality Team Specialist reviewed this patient medical record for the purposes of chart review for care gap closure. The following were reviewed: abstraction for care gap closure-glycemic status assessment.    VBCI Quality Team

## 2024-10-13 ENCOUNTER — Ambulatory Visit (HOSPITAL_COMMUNITY)
Admission: RE | Admit: 2024-10-13 | Discharge: 2024-10-13 | Disposition: A | Discharge status: Home / Self Care | Source: Ambulatory Visit | Attending: Cardiology | Admitting: Cardiology

## 2024-10-13 DIAGNOSIS — Z951 Presence of aortocoronary bypass graft: Secondary | ICD-10-CM | POA: Diagnosis not present

## 2024-10-13 DIAGNOSIS — I371 Nonrheumatic pulmonary valve insufficiency: Secondary | ICD-10-CM | POA: Diagnosis not present

## 2024-10-13 DIAGNOSIS — I509 Heart failure, unspecified: Secondary | ICD-10-CM | POA: Diagnosis not present

## 2024-10-13 DIAGNOSIS — I110 Hypertensive heart disease with heart failure: Secondary | ICD-10-CM | POA: Diagnosis not present

## 2024-10-13 DIAGNOSIS — Z950 Presence of cardiac pacemaker: Secondary | ICD-10-CM | POA: Diagnosis not present

## 2024-10-13 DIAGNOSIS — I429 Cardiomyopathy, unspecified: Secondary | ICD-10-CM | POA: Insufficient documentation

## 2024-10-13 DIAGNOSIS — Z8249 Family history of ischemic heart disease and other diseases of the circulatory system: Secondary | ICD-10-CM | POA: Insufficient documentation

## 2024-10-13 LAB — ECHOCARDIOGRAM COMPLETE
Area-P 1/2: 3.59 cm2
MV M vel: 5.17 m/s
MV Peak grad: 106.9 mmHg
Radius: 0.35 cm
S' Lateral: 4.5 cm

## 2024-10-15 ENCOUNTER — Ambulatory Visit: Payer: Self-pay | Admitting: Cardiology

## 2024-10-25 ENCOUNTER — Encounter: Payer: Self-pay | Admitting: Radiology

## 2024-11-15 ENCOUNTER — Other Ambulatory Visit: Payer: Self-pay | Admitting: Cardiology

## 2024-11-15 DIAGNOSIS — I10 Essential (primary) hypertension: Secondary | ICD-10-CM

## 2024-11-15 DIAGNOSIS — I2510 Atherosclerotic heart disease of native coronary artery without angina pectoris: Secondary | ICD-10-CM

## 2024-11-15 DIAGNOSIS — E1140 Type 2 diabetes mellitus with diabetic neuropathy, unspecified: Secondary | ICD-10-CM

## 2024-11-15 DIAGNOSIS — Z951 Presence of aortocoronary bypass graft: Secondary | ICD-10-CM

## 2024-11-15 DIAGNOSIS — I200 Unstable angina: Secondary | ICD-10-CM

## 2024-11-15 DIAGNOSIS — E785 Hyperlipidemia, unspecified: Secondary | ICD-10-CM

## 2024-11-15 DIAGNOSIS — I5022 Chronic systolic (congestive) heart failure: Secondary | ICD-10-CM

## 2024-12-10 ENCOUNTER — Telehealth: Payer: Self-pay | Admitting: Pharmacy Technician

## 2024-12-10 ENCOUNTER — Other Ambulatory Visit (HOSPITAL_COMMUNITY): Payer: Self-pay

## 2024-12-10 NOTE — Telephone Encounter (Signed)
 Pharmacy Patient Advocate Encounter   Received notification from Onbase that prior authorization for Ozempic  (1 MG/DOSE) 4MG /3ML pen-injectors  is required/requested.   Insurance verification completed.   The patient is insured through [facility] ADVANTAGE/RX ADVANCE.   Per test claim: PA required; PA submitted to above mentioned insurance via Latent Key/confirmation #/EOC [id number] Status is pending

## 2024-12-13 ENCOUNTER — Other Ambulatory Visit (HOSPITAL_COMMUNITY): Payer: Self-pay

## 2024-12-13 ENCOUNTER — Other Ambulatory Visit: Payer: Self-pay | Admitting: Cardiology

## 2024-12-13 ENCOUNTER — Other Ambulatory Visit: Payer: Self-pay | Admitting: Vascular Surgery

## 2024-12-13 DIAGNOSIS — Z8679 Personal history of other diseases of the circulatory system: Secondary | ICD-10-CM

## 2024-12-13 DIAGNOSIS — I739 Peripheral vascular disease, unspecified: Secondary | ICD-10-CM

## 2024-12-13 NOTE — Telephone Encounter (Signed)
 Pharmacy Patient Advocate Encounter  Received notification from [employer] that Prior Authorization for Ozempic  (1 MG/DOSE) 4MG /3ML pen-injectors  has been APPROVED from 12/10/24 to 12/10/25. Ran test claim, Copay is $0.00. This test claim was processed through [facility]- copay amounts may vary at other pharmacies due to pharmacy/plan contracts, or as the patient moves through the different stages of their insurance plan.   PA #/Case ID/Reference #: [id number]

## 2024-12-21 ENCOUNTER — Other Ambulatory Visit: Payer: Self-pay | Admitting: Family Medicine

## 2024-12-24 ENCOUNTER — Other Ambulatory Visit: Payer: Self-pay | Admitting: Cardiology

## 2024-12-30 ENCOUNTER — Encounter

## 2024-12-30 ENCOUNTER — Other Ambulatory Visit: Payer: Self-pay

## 2024-12-30 MED ORDER — METFORMIN HCL ER 500 MG PO TB24: ORAL_TABLET | ORAL | 0 refills | Status: AC

## 2025-01-14 ENCOUNTER — Other Ambulatory Visit: Payer: Self-pay

## 2025-02-02 ENCOUNTER — Encounter

## 2025-04-08 ENCOUNTER — Ambulatory Visit
